# Patient Record
Sex: Female | Born: 1940
Health system: Southern US, Community
[De-identification: ages and names within clinical notes are randomized; demographics above are authoritative.]

## PROBLEM LIST (undated history)

## (undated) DIAGNOSIS — G40909 Epilepsy, unspecified, not intractable, without status epilepticus: Secondary | ICD-10-CM

## (undated) DIAGNOSIS — K219 Gastro-esophageal reflux disease without esophagitis: Secondary | ICD-10-CM

## (undated) DIAGNOSIS — S92309A Fracture of unspecified metatarsal bone(s), unspecified foot, initial encounter for closed fracture: Secondary | ICD-10-CM

## (undated) DIAGNOSIS — R569 Unspecified convulsions: Secondary | ICD-10-CM

## (undated) DIAGNOSIS — D039 Melanoma in situ, unspecified: Secondary | ICD-10-CM

## (undated) DIAGNOSIS — I10 Essential (primary) hypertension: Secondary | ICD-10-CM

## (undated) HISTORY — DX: Gastro-esophageal reflux disease without esophagitis: K21.9

## (undated) HISTORY — DX: Fracture of unspecified metatarsal bone(s), unspecified foot, initial encounter for closed fracture: S92.309A

## (undated) HISTORY — DX: Epilepsy, unspecified, not intractable, without status epilepticus: G40.909

## (undated) HISTORY — DX: Essential (primary) hypertension: I10

## (undated) HISTORY — PX: EYE SURGERY: SHX253

## (undated) HISTORY — DX: Melanoma in situ, unspecified: D03.9

## (undated) HISTORY — DX: Unspecified convulsions: R56.9

---
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
## 1997-12-21 ENCOUNTER — Other Ambulatory Visit: Admission: RE | Admit: 1997-12-21 | Discharge: 1997-12-21 | Payer: Self-pay | Admitting: Obstetrics and Gynecology

## 1998-11-26 ENCOUNTER — Other Ambulatory Visit: Admission: RE | Admit: 1998-11-26 | Discharge: 1998-11-26 | Payer: Self-pay | Admitting: Obstetrics and Gynecology

## 2000-12-07 ENCOUNTER — Other Ambulatory Visit: Admission: RE | Admit: 2000-12-07 | Discharge: 2000-12-07 | Payer: Self-pay | Admitting: Obstetrics and Gynecology

## 2001-12-08 ENCOUNTER — Other Ambulatory Visit: Admission: RE | Admit: 2001-12-08 | Discharge: 2001-12-08 | Payer: Self-pay | Admitting: Obstetrics and Gynecology

## 2002-12-19 ENCOUNTER — Other Ambulatory Visit: Admission: RE | Admit: 2002-12-19 | Discharge: 2002-12-19 | Payer: Self-pay | Admitting: Obstetrics and Gynecology

## 2003-12-26 ENCOUNTER — Other Ambulatory Visit: Admission: RE | Admit: 2003-12-26 | Discharge: 2003-12-26 | Payer: Self-pay | Admitting: Obstetrics and Gynecology

## 2005-01-22 ENCOUNTER — Other Ambulatory Visit: Admission: RE | Admit: 2005-01-22 | Discharge: 2005-01-22 | Payer: Self-pay | Admitting: Obstetrics and Gynecology

## 2006-01-23 ENCOUNTER — Other Ambulatory Visit: Admission: RE | Admit: 2006-01-23 | Discharge: 2006-01-23 | Payer: Self-pay | Admitting: Obstetrics and Gynecology

## 2006-05-12 HISTORY — PX: FOOT SURGERY: SHX648

## 2006-10-12 ENCOUNTER — Ambulatory Visit: Payer: Self-pay | Admitting: Internal Medicine

## 2006-10-12 DIAGNOSIS — G40909 Epilepsy, unspecified, not intractable, without status epilepticus: Secondary | ICD-10-CM

## 2006-10-12 DIAGNOSIS — M810 Age-related osteoporosis without current pathological fracture: Secondary | ICD-10-CM | POA: Insufficient documentation

## 2006-10-12 DIAGNOSIS — K219 Gastro-esophageal reflux disease without esophagitis: Secondary | ICD-10-CM | POA: Insufficient documentation

## 2006-10-12 DIAGNOSIS — I10 Essential (primary) hypertension: Secondary | ICD-10-CM

## 2006-10-13 LAB — CONVERTED CEMR LAB
ALT: 25 units/L (ref 0–40)
Bilirubin, Direct: 0.1 mg/dL (ref 0.0–0.3)
Chloride: 99 meq/L (ref 96–112)
Eosinophils Absolute: 0.3 10*3/uL (ref 0.0–0.6)
Eosinophils Relative: 7.1 % — ABNORMAL HIGH (ref 0.0–5.0)
GFR calc Af Amer: 129 mL/min
GFR calc non Af Amer: 107 mL/min
HCT: 39.1 % (ref 36.0–46.0)
Lymphocytes Relative: 28.1 % (ref 12.0–46.0)
MCV: 94.7 fL (ref 78.0–100.0)
Neutro Abs: 2.2 10*3/uL (ref 1.4–7.7)
Neutrophils Relative %: 52.6 % (ref 43.0–77.0)
Potassium: 4.6 meq/L (ref 3.5–5.1)
RBC: 4.12 M/uL (ref 3.87–5.11)
Sodium: 134 meq/L — ABNORMAL LOW (ref 135–145)
Total Protein: 6.8 g/dL (ref 6.0–8.3)
WBC: 4.3 10*3/uL — ABNORMAL LOW (ref 4.5–10.5)

## 2006-11-03 ENCOUNTER — Encounter: Payer: Self-pay | Admitting: Internal Medicine

## 2007-02-11 ENCOUNTER — Ambulatory Visit: Payer: Self-pay | Admitting: Internal Medicine

## 2007-03-15 ENCOUNTER — Ambulatory Visit: Payer: Self-pay | Admitting: Gastroenterology

## 2007-03-29 ENCOUNTER — Encounter: Payer: Self-pay | Admitting: Internal Medicine

## 2007-03-29 ENCOUNTER — Encounter: Payer: Self-pay | Admitting: Gastroenterology

## 2007-03-29 ENCOUNTER — Ambulatory Visit: Payer: Self-pay | Admitting: Gastroenterology

## 2007-05-19 ENCOUNTER — Encounter: Payer: Self-pay | Admitting: Internal Medicine

## 2007-06-18 ENCOUNTER — Ambulatory Visit: Payer: Self-pay | Admitting: Internal Medicine

## 2007-10-11 ENCOUNTER — Telehealth (INDEPENDENT_AMBULATORY_CARE_PROVIDER_SITE_OTHER): Payer: Self-pay | Admitting: *Deleted

## 2007-12-29 ENCOUNTER — Ambulatory Visit: Payer: Self-pay | Admitting: Internal Medicine

## 2007-12-31 LAB — CONVERTED CEMR LAB
Alkaline Phosphatase: 97 units/L (ref 39–117)
BUN: 7 mg/dL (ref 6–23)
Basophils Absolute: 0.1 10*3/uL (ref 0.0–0.1)
Bilirubin, Direct: 0.1 mg/dL (ref 0.0–0.3)
CO2: 28 meq/L (ref 19–32)
Chloride: 96 meq/L (ref 96–112)
Creatinine, Ser: 0.7 mg/dL (ref 0.4–1.2)
Eosinophils Absolute: 0.3 10*3/uL (ref 0.0–0.7)
MCHC: 34.9 g/dL (ref 30.0–36.0)
MCV: 96.1 fL (ref 78.0–100.0)
Neutrophils Relative %: 65 % (ref 43.0–77.0)
Phenobarbital: 9.7 ug/mL — ABNORMAL LOW (ref 15.0–40.0)
Platelets: 307 10*3/uL (ref 150–400)
Potassium: 5.7 meq/L — ABNORMAL HIGH (ref 3.5–5.1)
RDW: 12.6 % (ref 11.5–14.6)
Total Bilirubin: 0.8 mg/dL (ref 0.3–1.2)
WBC: 5.5 10*3/uL (ref 4.5–10.5)

## 2008-01-10 ENCOUNTER — Ambulatory Visit: Payer: Self-pay | Admitting: Internal Medicine

## 2008-01-10 ENCOUNTER — Telehealth: Payer: Self-pay | Admitting: Internal Medicine

## 2008-01-10 DIAGNOSIS — E876 Hypokalemia: Secondary | ICD-10-CM | POA: Insufficient documentation

## 2008-01-11 ENCOUNTER — Encounter: Payer: Self-pay | Admitting: Internal Medicine

## 2008-01-11 LAB — CONVERTED CEMR LAB
CO2: 31 meq/L (ref 19–32)
Calcium: 9.5 mg/dL (ref 8.4–10.5)
Cortisol, Plasma: 5.5 ug/dL
GFR calc Af Amer: 108 mL/min
GFR calc non Af Amer: 89 mL/min
Glucose, Bld: 89 mg/dL (ref 70–99)
Potassium: 4.6 meq/L (ref 3.5–5.1)
Sodium: 132 meq/L — ABNORMAL LOW (ref 135–145)

## 2008-02-14 ENCOUNTER — Other Ambulatory Visit: Admission: RE | Admit: 2008-02-14 | Discharge: 2008-02-14 | Payer: Self-pay | Admitting: Obstetrics and Gynecology

## 2008-02-14 ENCOUNTER — Ambulatory Visit: Payer: Self-pay | Admitting: Obstetrics and Gynecology

## 2008-02-14 ENCOUNTER — Encounter: Payer: Self-pay | Admitting: Obstetrics and Gynecology

## 2008-03-23 ENCOUNTER — Encounter: Payer: Self-pay | Admitting: Internal Medicine

## 2008-04-19 ENCOUNTER — Encounter: Payer: Self-pay | Admitting: Internal Medicine

## 2008-06-23 ENCOUNTER — Ambulatory Visit: Payer: Self-pay | Admitting: Internal Medicine

## 2008-06-26 LAB — CONVERTED CEMR LAB
ALT: 15 units/L (ref 0–35)
AST: 25 units/L (ref 0–37)
Albumin: 4.5 g/dL (ref 3.5–5.2)
Basophils Absolute: 0 10*3/uL (ref 0.0–0.1)
Basophils Relative: 1 % (ref 0–1)
CO2: 22 meq/L (ref 19–32)
Calcium: 9.8 mg/dL (ref 8.4–10.5)
Chloride: 96 meq/L (ref 96–112)
Creatinine, Ser: 0.65 mg/dL (ref 0.40–1.20)
Hemoglobin: 13.2 g/dL (ref 12.0–15.0)
Lymphocytes Relative: 25 % (ref 12–46)
MCHC: 35 g/dL (ref 30.0–36.0)
Monocytes Absolute: 0.7 10*3/uL (ref 0.1–1.0)
Neutro Abs: 3.3 10*3/uL (ref 1.7–7.7)
Neutrophils Relative %: 56 % (ref 43–77)
Potassium: 4.5 meq/L (ref 3.5–5.3)
RBC: 4.25 M/uL (ref 3.87–5.11)
RDW: 12.3 % (ref 11.5–15.5)
Total Protein: 7 g/dL (ref 6.0–8.3)

## 2008-06-29 ENCOUNTER — Telehealth: Payer: Self-pay | Admitting: Internal Medicine

## 2008-07-27 ENCOUNTER — Ambulatory Visit: Payer: Self-pay | Admitting: Internal Medicine

## 2008-08-01 LAB — CONVERTED CEMR LAB: Creatinine, Ser: 0.7 mg/dL (ref 0.4–1.2)

## 2008-09-04 ENCOUNTER — Telehealth: Payer: Self-pay | Admitting: Internal Medicine

## 2008-10-02 ENCOUNTER — Ambulatory Visit: Payer: Self-pay | Admitting: Internal Medicine

## 2008-10-02 LAB — CONVERTED CEMR LAB
BUN: 11 mg/dL (ref 6–23)
CO2: 31 meq/L (ref 19–32)
Chloride: 95 meq/L — ABNORMAL LOW (ref 96–112)
Creatinine, Ser: 0.6 mg/dL (ref 0.4–1.2)
Glucose, Bld: 72 mg/dL (ref 70–99)
Potassium: 4.4 meq/L (ref 3.5–5.1)

## 2008-12-25 ENCOUNTER — Ambulatory Visit: Payer: Self-pay | Admitting: Internal Medicine

## 2009-03-07 ENCOUNTER — Ambulatory Visit: Payer: Self-pay | Admitting: Obstetrics and Gynecology

## 2009-03-12 ENCOUNTER — Encounter: Payer: Self-pay | Admitting: Internal Medicine

## 2009-03-20 ENCOUNTER — Encounter: Payer: Self-pay | Admitting: Internal Medicine

## 2009-06-28 ENCOUNTER — Ambulatory Visit: Payer: Self-pay | Admitting: Internal Medicine

## 2009-06-30 LAB — CONVERTED CEMR LAB: Phenobarbital: 10.2 ug/mL — ABNORMAL LOW (ref 15.0–40.0)

## 2009-07-02 LAB — CONVERTED CEMR LAB
ALT: 24 units/L (ref 0–35)
Alkaline Phosphatase: 73 units/L (ref 39–117)
Bilirubin, Direct: 0.2 mg/dL (ref 0.0–0.3)
Calcium: 10 mg/dL (ref 8.4–10.5)
Chloride: 96 meq/L (ref 96–112)
Glucose, Bld: 85 mg/dL (ref 70–99)
HCT: 41.6 % (ref 36.0–46.0)
Hemoglobin: 13.8 g/dL (ref 12.0–15.0)
MCV: 94.4 fL (ref 78.0–100.0)
Monocytes Relative: 5.8 % (ref 3.0–12.0)
Phosphorus: 4 mg/dL (ref 2.3–4.6)
Platelets: 263 10*3/uL (ref 150.0–400.0)
Potassium: 4.8 meq/L (ref 3.5–5.1)
RDW: 13 % (ref 11.5–14.6)
Sodium: 131 meq/L — ABNORMAL LOW (ref 135–145)
Total Bilirubin: 0.6 mg/dL (ref 0.3–1.2)

## 2009-12-31 ENCOUNTER — Ambulatory Visit: Payer: Self-pay | Admitting: Internal Medicine

## 2010-02-06 ENCOUNTER — Encounter: Payer: Self-pay | Admitting: Gastroenterology

## 2010-03-05 ENCOUNTER — Encounter (INDEPENDENT_AMBULATORY_CARE_PROVIDER_SITE_OTHER): Payer: Self-pay | Admitting: *Deleted

## 2010-03-11 ENCOUNTER — Other Ambulatory Visit: Admission: RE | Admit: 2010-03-11 | Discharge: 2010-03-11 | Payer: Self-pay | Admitting: Obstetrics and Gynecology

## 2010-03-11 ENCOUNTER — Ambulatory Visit: Payer: Self-pay | Admitting: Obstetrics and Gynecology

## 2010-03-13 ENCOUNTER — Encounter: Payer: Self-pay | Admitting: Internal Medicine

## 2010-03-14 ENCOUNTER — Encounter: Payer: Self-pay | Admitting: Internal Medicine

## 2010-03-15 ENCOUNTER — Telehealth: Payer: Self-pay | Admitting: Internal Medicine

## 2010-03-18 ENCOUNTER — Encounter: Payer: Self-pay | Admitting: Internal Medicine

## 2010-03-25 ENCOUNTER — Encounter: Payer: Self-pay | Admitting: Internal Medicine

## 2010-04-09 ENCOUNTER — Encounter: Payer: Self-pay | Admitting: Internal Medicine

## 2010-04-23 ENCOUNTER — Encounter: Payer: Self-pay | Admitting: Internal Medicine

## 2010-06-12 ENCOUNTER — Encounter: Payer: Self-pay | Admitting: Gastroenterology

## 2010-06-13 NOTE — Medication Information (Signed)
Summary: Clarification for [facility]  Clarification for [facility]   Imported By: Lanelle Bal 04/26/2010 09:31:00  _____________________________________________________________________  External Attachment:    Type:   Image     Comment:   External Document

## 2010-06-13 NOTE — Miscellaneous (Signed)
Summary: FLU VACCINE   Clinical Lists Changes  Observations: Added new observation of FLU VAX: Historical (04/03/2010 15:36)      Influenza Immunization History:    Influenza # 1:  Historical (04/03/2010)

## 2010-06-13 NOTE — Assessment & Plan Note (Signed)
Summary: 6 MONTH FOLOW UP/[facility]   Vital Signs:  Patient profile:   70 year old female Weight:      120 pounds BMI:     21.85 Temp:     97.8 degrees F oral Pulse rate:   60 / minute Pulse rhythm:   regular BP sitting:   160 / 80  (left arm) Cuff size:   regular  Vitals Entered By: Mervin Hack CMA Duncan Dull) (December 31, 2009 2:26 PM) CC: 6 month follow-up   History of Present Illness: DOing well No new concerns  Has appt for DEXA in November and will see Dr Valarie Cones in December. Has sig dietary calcium still on vitamin D  Has been monitoring blood pressure avg 141/71 No headaches  No chest pain  No SOB  No seizures Had seizure in 1997 Tried going off mebaral a couple of years later and had a break through seizure She is comfortable with staying on this No sedation or other problems with mebaral  No stomach trouble no heartburn No meds for this  Allergies: 1)  ! Dilantin  Past History:  Past medical, surgical, family and social histories (including risk factors) reviewed for relevance to current acute and chronic problems.  Past Medical History: Reviewed history from 10/12/2006 and no changes required. GERD Hypertension Osteoporosis Seizure disorder Migraines--none in years Vitamin D deficiency  CONSULTANTS Dr Dianne Dun  Past Surgical History: Reviewed history from 10/12/2006 and no changes required. Tonsillectomy and adenoids--1948 Repair of fractured R metatarsal --Dr Duda---4/08  Family History: Reviewed history from 12/25/2008 and no changes required. Dad died @35 --MI? Mom died @88 ---head injury after fall Only child CAD in paternal aunts and uncles CVA in grandparents on both sides DM in maternal aunt HTN in mom Daughter  diagnosed with breast cancer  Social History: Reviewed history from 10/12/2006 and no changes required. Retired--teacher's asst Married--3 children Never Smoked Alcohol use-yes--occ Regular exercise-yes---aerobics,  Pilates, jogged in past (now walks)  Review of Systems       Appetite is good weight is up 3#--feels about 5# over her ideal weight exercises daily Sleeps okay  Physical Exam  General:  alert and normal appearance.   Neck:  supple, no masses, no thyromegaly, no carotid bruits, and no cervical lymphadenopathy.   Lungs:  normal respiratory effort, no intercostal retractions, no accessory muscle use, and normal breath sounds.   Heart:  normal rate, regular rhythm, no murmur, and no gallop.   Abdomen:  soft and non-tender.   Msk:  no joint tenderness and no joint swelling.   Extremities:  no edema Psych:  normally interactive, good eye contact, not anxious appearing, and not depressed appearing.     Impression & Recommendations:  Problem # 1:  HYPERTENSION (ICD-401.9) Assessment Unchanged up some today still fine at home no changes  Her updated medication list for this problem includes:    Norvasc 5 Mg Tabs (Amlodipine besylate) .Marland Kitchen... Take 1 tablet by mouth once a day    Toprol Xl 100 Mg Tb24 (Metoprolol succinate) .Marland Kitchen... 1 daily    Cozaar 100 Mg Tabs (Losartan potassium) .Marland Kitchen... Take 1 tablet by mouth once daily  BP today: 160/80 Prior BP: 142/80 (06/28/2009)  Labs Reviewed: K+: 4.8 (06/28/2009) Creat: : 0.6 (06/28/2009)     Problem # 2:  SEIZURE DISORDER (ICD-780.39) Assessment: Unchanged none on mebaral will continue--she is not comfortable with trying wean  Her updated medication list for this problem includes:    Mebaral 100 Mg Tabs (Mephobarbital) .Marland Kitchen... 1 tab  by mouth two times a day for seizure disorder  Problem # 3:  OSTEOPOROSIS (ICD-733.00) Assessment: Unchanged has DEXA and endocrine follow up Dr Valarie Cones to make decision about the fosamax  Her updated medication list for this problem includes:    Fosamax 35 Mg Tabs (Alendronate sodium) .Marland Kitchen... 1 weekly    Vitamin D (ergocalciferol) 50000 Unit Caps (Ergocalciferol) .Marland Kitchen... Take 1 by mouth two times a  month  Problem # 4:  GERD (ICD-530.81) Assessment: Unchanged fine without meds  Complete Medication List: 1)  Norvasc 5 Mg Tabs (Amlodipine besylate) .... Take 1 tablet by mouth once a day 2)  Estring Ring (Estradiol ring) .... Every 3 months 3)  Fosamax 35 Mg Tabs (Alendronate sodium) .Marland KitchenMarland KitchenMarland Kitchen 1 weekly 4)  Toprol Xl 100 Mg Tb24 (Metoprolol succinate) .Marland Kitchen.. 1 daily 5)  Mebaral 100 Mg Tabs (Mephobarbital) .Marland Kitchen.. 1 tab by mouth two times a day for seizure disorder 6)  Cozaar 100 Mg Tabs (Losartan potassium) .... Take 1 tablet by mouth once daily 7)  Vitamin D (ergocalciferol) 50000 Unit Caps (Ergocalciferol) .... Take 1 by mouth two times a month  Patient Instructions: 1)  Please schedule a follow-up appointment in 6 months for annual wellness visit Prescriptions: MEBARAL 100 MG TABS (MEPHOBARBITAL) 1 tab by mouth two times a day for seizure disorder  #180 x 1   Entered by:   Mervin Hack CMA (AAMA)   Authorized by:   Cindee Salt MD   Signed by:   Mervin Hack CMA (AAMA) on 12/31/2009   Method used:   Print then Give to Patient   RxID:   [id number] TOPROL XL 100 MG  TB24 (METOPROLOL SUCCINATE) 1 daily  #102 x 3   Entered by:   Mervin Hack CMA (AAMA)   Authorized by:   Cindee Salt MD   Signed by:   Mervin Hack CMA (AAMA) on 12/31/2009   Method used:   Faxed to ...       [facility] (mail-order)             , [place]         Ph: [phone]       Fax: [phone]   RxID:   [id number]   Current Allergies (reviewed today): ! DILANTIN

## 2010-06-13 NOTE — Assessment & Plan Note (Signed)
Summary: 6 MONTH FOLLOW UP   Vital Signs:  Patient profile:   70 year old female Weight:      117 pounds BMI:     21.30 Temp:     98.2 degrees F oral Pulse rate:   64 / minute Pulse rhythm:   regular BP sitting:   142 / 80  (left arm) Cuff size:   regular  Vitals Entered By: Mervin Hack CMA Duncan Dull) (June 28, 2009 3:50 PM) CC: 6 month follow-up   History of Present Illness: Doing well No new concerns  No seizures  Monitors BP about monthly Range 112/77  to  155/85 AVg 138/80  Still on fosamax Started it about 8 years ago Has follow up with Dr Valarie Cones later this year On vitamin D  No problems with stomach No acid meds  keeps up with gyn still on estring  Allergies: 1)  ! Dilantin  Past History:  Past medical, surgical, family and social histories (including risk factors) reviewed for relevance to current acute and chronic problems.  Past Medical History: Reviewed history from 10/12/2006 and no changes required. GERD Hypertension Osteoporosis Seizure disorder Migraines--none in years Vitamin D deficiency  CONSULTANTS Dr Dianne Dun  Past Surgical History: Reviewed history from 10/12/2006 and no changes required. Tonsillectomy and adenoids--1948 Repair of fractured R metatarsal --Dr Duda---4/08  Family History: Reviewed history from 12/25/2008 and no changes required. Dad died @35 --MI? Mom died @88 ---head injury after fall Only child CAD in paternal aunts and uncles CVA in grandparents on both sides DM in maternal aunt HTN in mom Daughter  diagnosed with breast cancer  Social History: Reviewed history from 10/12/2006 and no changes required. 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     Neck:  supple, no masses, no thyromegaly, no carotid bruits, and no cervical lymphadenopathy.   Lungs:  normal respiratory effort and normal breath sounds.   Heart:  normal rate, regular rhythm, no murmur, and no gallop.   Abdomen:  soft and non-tender.   Msk:  no joint tenderness and no joint swelling.   Pulses:  1+ in feet Extremities:  no edema Psych:  normally interactive, good eye contact, not anxious appearing, and not depressed appearing.     Impression & Recommendations:  Problem # 1:  HYPERTENSION (ICD-401.9) Assessment Unchanged  good control no changes needed  Her updated medication list for this problem includes:    Norvasc 5 Mg Tabs (Amlodipine besylate) .Marland Kitchen... Take 1 tablet by mouth once a day    Toprol Xl 100 Mg Tb24 (Metoprolol succinate) .Marland Kitchen... 1 daily    Cozaar 100 Mg Tabs (Losartan potassium) .Marland Kitchen... Take 1 tablet by mouth once daily  BP today: 142/80 Prior BP: 130/70 (12/25/2008)  Labs Reviewed: K+: 4.4 (10/02/2008) Creat: : 0.6 (10/02/2008)     Orders: Venipuncture (40981) TLB-CBC Platelet - w/Differential (85025-CBCD) TLB-Renal Function Panel (80069-RENAL) TLB-Hepatic/Liver Function Pnl (80076-HEPATIC) TLB-TSH (Thyroid Stimulating Hormone) (19147-WGN) Prescription Created Electronically [phone])  Problem # 2:  OSTEOPOROSIS (ICD-733.00) Assessment: Unchanged probably should go off fosamax will review with endocrinologist  Her updated medication list for this problem includes:    Fosamax 35 Mg Tabs (Alendronate sodium) .Marland Kitchen... 1 weekly    Vitamin D (ergocalciferol) 50000 Unit Caps (Ergocalciferol) .Marland Kitchen... Take 1 by mouth two times a  month  Problem # 3:  SEIZURE DISORDER (ICD-780.39) Assessment: Unchanged  none on meds will check phenobarb level  Her updated medication list for this problem includes:    Mebaral 100 Mg Tabs (Mephobarbital) .Marland Kitchen... 1 tab by mouth two times a day for seizure disorder  Orders: Specimen Handling (04540) T- * Misc.  Laboratory test [phone])  Complete Medication List: 1)  Norvasc 5 Mg Tabs (Amlodipine besylate) .... Take 1 tablet by mouth once a day 2)  Estring Ring (Estradiol ring) .... Every 3 months 3)  Fosamax 35 Mg Tabs (Alendronate sodium) .Marland KitchenMarland KitchenMarland Kitchen 1 weekly 4)  Toprol Xl 100 Mg Tb24 (Metoprolol succinate) .Marland Kitchen.. 1 daily 5)  Mebaral 100 Mg Tabs (Mephobarbital) .Marland Kitchen.. 1 tab by mouth two times a day for seizure disorder 6)  Cozaar 100 Mg Tabs (Losartan potassium) .... Take 1 tablet by mouth once daily 7)  Vitamin D (ergocalciferol) 50000 Unit Caps (Ergocalciferol) .... Take 1 by mouth two times a month  Patient Instructions: 1)  Please schedule a follow-up appointment in 6 months .  Prescriptions: MEBARAL 100 MG TABS (MEPHOBARBITAL) 1 tab by mouth two times a day for seizure disorder  #180 x 1   Entered and Authorized by:   Cindee Salt MD   Signed by:   Cindee Salt MD on 06/28/2009   Method used:   Print then Give to Patient   RxID:   [id number] COZAAR 100 MG TABS (LOSARTAN POTASSIUM) take 1 tablet by mouth once daily  #90 x 3   Entered by:   Mervin Hack CMA (AAMA)   Authorized by:   Cindee Salt MD   Signed by:   Mervin Hack CMA (AAMA) on 06/28/2009   Method used:   Electronically to        [facility] (mail-order)             ,          Ph: [phone]       Fax: [phone]   RxID:   [id number] NORVASC 5 MG TABS (AMLODIPINE BESYLATE) Take 1 tablet by mouth once a day  #90 x 3   Entered by:   Mervin Hack CMA (AAMA)   Authorized by:   Cindee Salt MD   Signed by:   Mervin Hack CMA (AAMA) on 06/28/2009   Method used:   Electronically to        [facility] (mail-order)             ,          Ph: [phone]       Fax: [phone]   RxID:   [id number]   Current Allergies (reviewed today): ! DILANTIN  Appended Document: 6 MONTH FOLLOW UP

## 2010-06-13 NOTE — Letter (Signed)
Summary: Pre Visit Letter Revised  [facility]  [address] [place], [place] [address]   Phone: [phone]  Fax: [phone]        03/05/2010 MRN: [id number] Tanya Knight [address] [place], [place]  [address]             Procedure Date:  04/17/2010   Welcome to the Gastroenterology Division at [facility].    You are scheduled to see a nurse for your pre-procedure visit on 04/08/2010 at 1:00PM on the 3rd floor at [facility], [address].  We ask that you try to arrive at our office 15 minutes prior to your appointment time to allow for check-in.  Please take a minute to review the attached form.  If you answer "Yes" to one or more of the questions on the first page, we ask that you call the person listed at your earliest opportunity.  If you answer "No" to all of the questions, please complete the rest of the form and bring it to your appointment.    Your nurse visit will consist of discussing your medical and surgical history, your immediate family medical history, and your medications.   If you are unable to list all of your medications on the form, please bring the medication bottles to your appointment and we will list them.  We will need to be aware of both prescribed and over the counter drugs.  We will need to know exact dosage information as well.    Please be prepared to read and sign documents such as consent forms, a financial agreement, and acknowledgement forms.  If necessary, and with your consent, a friend or relative is welcome to sit-in on the nurse visit with you.  Please bring your insurance card so that we may make a copy of it.  If your insurance requires a referral to see a specialist, please bring your referral form from your primary care physician.  No co-pay is required for this nurse visit.     If you cannot keep your appointment, please call [phone] to cancel or reschedule prior to your appointment date.  This  allows [place] the opportunity to schedule an appointment for another patient in need of care.    Thank you for choosing [facility] for your medical needs.  We appreciate the opportunity to care for you.  Please visit [place] at our website  to learn more about our practice.  Sincerely, The Gastroenterology Division

## 2010-06-13 NOTE — Letter (Signed)
Summary: Colonoscopy Letter  [facility]  [address] [place], [place] [address]   Phone: [phone]  Fax: [phone]      February 06, 2010 MRN: [id number]   Tanya Knight [address] [place], [place]  [address]   Dear [name],   According to your medical record, it is time for you to schedule a Colonoscopy. The American Cancer Society recommends this procedure as a method to detect early colon cancer. Patients with a family history of colon cancer, or a personal history of colon polyps or inflammatory bowel disease are at increased risk.  This letter has been generated based on the recommendations made at the time of your procedure. If you feel that in your particular situation this may no longer apply, please contact our office.  Please call our office at [phone] to schedule this appointment or to update your records at your earliest convenience.  Thank you for cooperating with [place] to provide you with the very best care possible.   Sincerely,  Rachael Fee, M.D.  [facility] Gastroenterology Division [phone]

## 2010-06-13 NOTE — Letter (Signed)
Summary: [facility]  [facility]   Imported By: Lanelle Bal 05/14/2010 10:40:28  _____________________________________________________________________  External Attachment:    Type:   Image     Comment:   External Document  Appended Document: [facility] will start alendronate drug holiday after 10 years of Rx

## 2010-06-13 NOTE — Letter (Signed)
Summary: Results Follow up Letter  [place] [facility]  [address] [place], [place] [address]   Phone: [phone]  Fax: [phone]    03/14/2010 MRN: [id number]  Tanya Knight [address] [place], [place]  [address]  Dear Ms. Duby,  The following are the results of your recent test(s):  Test         Result    Pap Smear:        Normal _____  Not Normal _____ Comments: ______________________________________________________ Cholesterol: LDL(Bad cholesterol):         Your goal is less than:         HDL (Good cholesterol):       Your goal is more than: Comments:  ______________________________________________________ Mammogram:        Normal __x___  Not Normal _____ Comments:mammo looks fine repeat recommended in 1-2 years  ___________________________________________________________________ Hemoccult:        Normal _____  Not normal _______ Comments:    _____________________________________________________________________ Other Tests:    We routinely do not discuss normal results over the telephone.  If you desire a copy of the results, or you have any questions about this information we can discuss them at your next office visit.   Sincerely,      Tillman Abide, MD

## 2010-06-13 NOTE — Progress Notes (Signed)
Summary: Alternative for Mebaral-[facility]  Phone Note From Pharmacy Call back at [phone]   Caller: [facility] Call For: Dr. Alphonsus Sias  Summary of Call: Received faxed form from [facility] stating that Mebaral has been discontinued from the manufacture and patient needs a alternative.  Form in your IN box.   Initial call taken by: Linde Gillis CMA Duncan Dull),  March 15, 2010 8:43 AM  Follow-up for Phone Call        message left on her machine May want to change to phenobarbital or other anticonvulsant Cindee Salt MD  March 15, 2010 2:10 PM   Pt returned your call after you had left.  Please call her back on monday, cell is [phone] if she is not at home. Follow-up by: Lowella Petties CMA, AAMA,  March 15, 2010 2:20 PM  Additional Follow-up for Phone Call Additional follow up Details #1::        has about 5 months built up will continue the mebaral and we will discuss our alternatives at her next appt in February Additional Follow-up by: Cindee Salt MD,  March 18, 2010 2:07 PM

## 2010-06-19 NOTE — Letter (Signed)
Summary: Pre Visit Letter Revised  [facility]  [address] [place], [place] [address]   Phone: [phone]  Fax: [phone]        06/12/2010 MRN: [id number]  Tanya Knight [address] [place], [place]  [address]             Procedure Date:  07-23-10 10:30am           Dr Christella Hartigan Recall Colon   Welcome to the Gastroenterology Division at [facility].    You are scheduled to see a nurse for your pre-procedure visit on 07-09-10 at 1pm on the 3rd floor at [facility], [address].  We ask that you try to arrive at our office 15 minutes prior to your appointment time to allow for check-in.  Please take a minute to review the attached form.  If you answer "Yes" to one or more of the questions on the first page, we ask that you call the person listed at your earliest opportunity.  If you answer "No" to all of the questions, please complete the rest of the form and bring it to your appointment.    Your nurse visit will consist of discussing your medical and surgical history, your immediate family medical history, and your medications.   If you are unable to list all of your medications on the form, please bring the medication bottles to your appointment and we will list them.  We will need to be aware of both prescribed and over the counter drugs.  We will need to know exact dosage information as well.    Please be prepared to read and sign documents such as consent forms, a financial agreement, and acknowledgement forms.  If necessary, and with your consent, a friend or relative is welcome to sit-in on the nurse visit with you.  Please bring your insurance card so that we may make a copy of it.  If your insurance requires a referral to see a specialist, please bring your referral form from your primary care physician.  No co-pay is required for this nurse visit.     If you cannot keep your appointment, please call [phone] to cancel or reschedule prior  to your appointment date.  This allows [place] the opportunity to schedule an appointment for another patient in need of care.    Thank you for choosing [facility] for your medical needs.  We appreciate the opportunity to care for you.  Please visit [place] at our website  to learn more about our practice.  Sincerely, The Gastroenterology Division

## 2010-07-02 ENCOUNTER — Encounter: Payer: Self-pay | Admitting: Internal Medicine

## 2010-07-02 ENCOUNTER — Encounter (INDEPENDENT_AMBULATORY_CARE_PROVIDER_SITE_OTHER): Payer: Medicare Other | Admitting: Internal Medicine

## 2010-07-02 DIAGNOSIS — Z0000 Encounter for general adult medical examination without abnormal findings: Secondary | ICD-10-CM

## 2010-07-02 DIAGNOSIS — R569 Unspecified convulsions: Secondary | ICD-10-CM

## 2010-07-05 ENCOUNTER — Encounter (INDEPENDENT_AMBULATORY_CARE_PROVIDER_SITE_OTHER): Payer: Self-pay

## 2010-07-09 ENCOUNTER — Encounter: Payer: Self-pay | Admitting: Gastroenterology

## 2010-07-09 ENCOUNTER — Telehealth: Payer: Self-pay | Admitting: Gastroenterology

## 2010-07-09 NOTE — Assessment & Plan Note (Signed)
Summary: CPE/[facility] wellness/JRR   Vital Signs:  Patient profile:   70 year old female Height:      61.5 inches Weight:      118 pounds Temp:     98.5 degrees F oral Pulse rate:   54 / minute Pulse rhythm:   regular BP sitting:   165 / 85  (left arm) Cuff size:   regular  Vitals Entered By: Mervin Hack CMA Duncan Dull) (July 02, 2010 9:32 AM) CC: adult physical   History of Present Illness: DOing well Still sees Dr Dianne Dun for gyn  No seizures  Almost out of mebaral which has been discontinued First seizure 1997 She tried weaning in 1999---has breakthrough so has been back on meds since then  COntinues to work out regularly Reviewed her wellness visit questionnaire no other concerns   Checks her BP at home average of 145/78  Allergies: 1)  ! Dilantin  Past History:  Past medical, surgical, family and social histories (including risk factors) reviewed for relevance to current acute and chronic problems.  Past Medical History: Reviewed history from 10/12/2006 and no changes required. GERD Hypertension Osteoporosis Seizure disorder Migraines--none in years Vitamin D deficiency  CONSULTANTS Dr Dianne Dun  Past Surgical History: Reviewed history from 10/12/2006 and no changes required. Tonsillectomy and adenoids--1948 Repair of fractured R metatarsal --Dr Duda---4/08  Family History: Reviewed history from 12/25/2008 and no changes required. Dad died @35 --MI? Mom died @88 ---head injury after fall Only child CAD in paternal aunts and uncles CVA in grandparents on both sides DM in maternal aunt HTN in mom Daughter  diagnosed with breast cancer  Social History: Reviewed history from 10/12/2006 and no changes required. Retired--teacher's asst Married--3 children Never Smoked Alcohol use-yes--occ Regular exercise-yes---aerobics, Pilates, jogged in past (now walks)  Review of Systems General:  weight fairly stable sleeps well wears seat  belt. Eyes:  Denies double vision and vision loss-1 eye. ENT:  Denies decreased hearing and ringing in ears; teeth fine---regular with dentist. CV:  Denies chest pain or discomfort, difficulty breathing at night, difficulty breathing while lying down, fainting, lightheadness, palpitations, and shortness of breath with exertion. Resp:  Denies cough and shortness of breath. GI:  Denies abdominal pain, bloody stools, change in bowel habits, dark tarry stools, indigestion, nausea, and vomiting; has appt for colonoscopy. GU:  Denies dysuria, incontinence, and urinary frequency; no sexual problems--uses estring and K-Y for lubricaiton. MS:  Denies joint pain and joint swelling. Derm:  Denies lesion(s) and rash; sees derm regularly due to past suspicious lesions. Neuro:  Denies headaches, numbness, seizures, tingling, and weakness. Psych:  Denies anxiety and depression. Heme:  Denies abnormal bruising and enlarge lymph nodes. Allergy:  Denies seasonal allergies and sneezing.  Physical Exam  General:  alert and normal appearance.   Eyes:  pupils equal, pupils round, and pupils reactive to light.   Ears:  R ear normal and L ear normal.   Mouth:  no erythema, no exudates, and no lesions.   Neck:  supple, no masses, no thyromegaly, no carotid bruits, and no cervical lymphadenopathy.   Lungs:  normal respiratory effort, no intercostal retractions, no accessory muscle use, and normal breath sounds.   Heart:  normal rate, regular rhythm, no murmur, and no gallop.   Abdomen:  soft, non-tender, and no masses.   Msk:  no joint tenderness and no joint swelling.   Slight thickening in DIP and PIPs in hands Pulses:  normal in feet Extremities:  no edema Neurologic:  alert & oriented  X3, strength normal in all extremities, and gait normal.  Normal tone Skin:  no rashes and no suspicious lesions.   Axillary Nodes:  No palpable lymphadenopathy Psych:  normally interactive, good eye contact, not anxious  appearing, and not depressed appearing.     Impression & Recommendations:  Problem # 1:  PREVENTIVE HEALTH CARE (ICD-V70.0) Assessment Comment Only healthy keeps up with fitness  Problem # 2:  SEIZURE DISORDER (ICD-780.39) Assessment: Unchanged no seizures but we have to change meds will have her wean off mebaral as she starts depakote recheck 6 weeks with labs and evaluation limit individual driving for 2 weeks after off mebaral  The following medications were removed from the medication list:    Mebaral 100 Mg Tabs (Mephobarbital) .Marland Kitchen... 1 tab by mouth two times a day for seizure disorder Her updated medication list for this problem includes:    Divalproex Sodium 500 Mg Tbec (Divalproex sodium) .Marland Kitchen... 1 tab by mouth two times a day to prevent seizures  Problem # 3:  HYPERTENSION (ICD-401.9) Assessment: Unchanged up some today will recheck in 6 weeks no change for now  Her updated medication list for this problem includes:    Norvasc 5 Mg Tabs (Amlodipine besylate) .Marland Kitchen... Take 1 tablet by mouth once a day    Toprol Xl 100 Mg Tb24 (Metoprolol succinate) .Marland Kitchen... 1 daily    Cozaar 100 Mg Tabs (Losartan potassium) .Marland Kitchen... Take 1 tablet by mouth once daily  BP today: 165/85 Prior BP: 160/80 (12/31/2009)  Labs Reviewed: K+: 4.8 (06/28/2009) Creat: : 0.6 (06/28/2009)     Problem # 4:  OSTEOPOROSIS (ICD-733.00) Assessment: Unchanged on vitmain D med holiday from bisphosphonate after 10 years  The following medications were removed from the medication list:    Fosamax 35 Mg Tabs (Alendronate sodium) .Marland Kitchen... 1 weekly Her updated medication list for this problem includes:    Vitamin D (ergocalciferol) 50000 Unit Caps (Ergocalciferol) .Marland Kitchen... Take 1 by mouth two times a month  Complete Medication List: 1)  Norvasc 5 Mg Tabs (Amlodipine besylate) .... Take 1 tablet by mouth once a day 2)  Toprol Xl 100 Mg Tb24 (Metoprolol succinate) .Marland Kitchen.. 1 daily 3)  Cozaar 100 Mg Tabs (Losartan  potassium) .... Take 1 tablet by mouth once daily 4)  Vitamin D (ergocalciferol) 50000 Unit Caps (Ergocalciferol) .... Take 1 by mouth two times a month 5)  Estring Ring (Estradiol ring) .... Every 3 months 6)  Divalproex Sodium 500 Mg Tbec (Divalproex sodium) .Marland Kitchen.. 1 tab by mouth two times a day to prevent seizures  Patient Instructions: 1)  Please decrease the mebaral to 1 tab daily while starting the new medication (valproic acid). Take them both for 2 weeks and then stop the mebaral. 2)  Please don't drive yourself for about 2 weeks after going off the mebaral 3)  Call if you have any troubling side effects with the new med 4)  Please schedule a follow-up appointment in 6 weeks.  Prescriptions: DIVALPROEX SODIUM 500 MG TBEC (DIVALPROEX SODIUM) 1 tab by mouth two times a day to prevent seizures  #60 x 2   Entered and Authorized by:   Cindee Salt MD   Signed by:   Cindee Salt MD on 07/02/2010   Method used:   Electronically to        [facility] (retail)       [address]       [place], [place]  [address]       Ph: [phone]       Fax: [phone]   RxID:   [id number] VITAMIN D (ERGOCALCIFEROL) 50000 UNIT CAPS (ERGOCALCIFEROL) take 1 by mouth two times a month  #24 x 0   Entered and Authorized by:   Cindee Salt MD   Signed by:   Cindee Salt MD on 07/02/2010   Method used:   Electronically to        [facility] ([facility]       [address]       [place], [place]  [address]       Ph: [phone]       Fax: [phone]   RxID:   [id number] COZAAR 100 MG TABS (LOSARTAN POTASSIUM) take 1 tablet by mouth once daily  #90 x 3   Entered by:   Mervin Hack CMA (AAMA)   Authorized by:   Cindee Salt MD   Signed by:   Mervin Hack CMA (AAMA) on 07/02/2010   Method used:   Electronically to        [facility]* (retail)             ,           Ph: [phone]       Fax: [phone]   RxID:   [id number] NORVASC 5 MG TABS (AMLODIPINE BESYLATE) Take 1 tablet by mouth once a day  #90 x 3   Entered by:   Mervin Hack CMA (AAMA)   Authorized by:   Cindee Salt MD   Signed by:   Mervin Hack CMA (AAMA) on 07/02/2010   Method used:   Electronically to        [facility]* (retail)             ,          Ph: [phone]       Fax: [phone]   RxID:   [id number]    Orders Added: 1)  Est. Patient 65& > [93810] 2)  Est. Patient Level III [17510]   Immunization History:  Hepatitis B Immunization History:    Hepatitis B # 1:  HepB Adult (09/14/1997)    Hepatitis B # 2:  HepB Adult (10/16/1997)    Hepatitis B # 3:  HepB Adult (03/15/1998)  Tetanus/Td Immunization History:    Tetanus/Td:  Td (10/01/2006)  Influenza Immunization History:    Influenza:  Historical (04/03/2010)  Pneumovax Immunization History:    Pneumovax:  Pneumovax (03/23/2006)  Zostavax History:    Zostavax # 1:  Zostavax (05/17/2007)   Immunization History:  Zostavax History:    Zostavax # 1:  Zostavax (05/17/2007)  Current Allergies (reviewed today): ! DILANTIN

## 2010-07-09 NOTE — Letter (Signed)
Summary: Nature conservation officer [employer] Wellness Visit Questionnaire   [employer] Annual Wellness Visit Questionnaire   Imported By: Beau Fanny 07/02/2010 16:53:44  _____________________________________________________________________  External Attachment:    Type:   Image     Comment:   External Document

## 2010-07-11 ENCOUNTER — Telehealth (INDEPENDENT_AMBULATORY_CARE_PROVIDER_SITE_OTHER): Payer: Self-pay | Admitting: *Deleted

## 2010-07-15 ENCOUNTER — Telehealth: Payer: Self-pay | Admitting: Family Medicine

## 2010-07-18 NOTE — Progress Notes (Signed)
Summary: Medication   Phone Note From Pharmacy   Caller: [facility] [phone] Call For: Dr. Christella Hartigan  Summary of Call: Moviprep is too expensive...Marland Kitchenneeds an alternative med. Initial call taken by: Karna Christmas,  July 09, 2010 2:59 PM  Follow-up for Phone Call        Phone call completed Follow-up by: Wyona Almas RN,  July 09, 2010 3:07 PM

## 2010-07-18 NOTE — Miscellaneous (Signed)
Summary: Lec previsit  Clinical Lists Changes  Medications: Added new medication of MOVIPREP 100 GM  SOLR (PEG-KCL-NACL-NASULF-NA ASC-C) As per prep instructions. - Signed Rx of MOVIPREP 100 GM  SOLR (PEG-KCL-NACL-NASULF-NA ASC-C) As per prep instructions.;  #1 x 0;  Signed;  Entered by: Ulis Rias RN;  Authorized by: Rachael Fee MD;  Method used: [facility] to [id number] [address], [place], [place]  [address], Ph: [phone], Fax: [phone]    Prescriptions: MOVIPREP 100 GM  SOLR (PEG-KCL-NACL-NASULF-NA ASC-C) As per prep instructions.  #1 x 0   Entered by:   Ulis Rias RN   Authorized by:   Rachael Fee MD   Signed by:   Ulis Rias RN on 07/09/2010   Method used:   Electronically to        [id number] ([facility])       [address]       [place], [place]  [address]       Ph: [phone]       Fax: [phone]   RxID:   [id number]

## 2010-07-18 NOTE — Progress Notes (Signed)
Summary: ? re prep   Phone Note Call from Patient Call back at Home Phone [phone]   Caller: Patient Call For: Dr Christella Hartigan Reason for Call: Talk to Nurse Summary of Call: Patient wants to speak to nurse re: prep Initial call taken by: Tawni Levy,  July 11, 2010 11:32 AM  Follow-up for Phone Call        pt's questions were answered. Follow-up by: Ezra Sites RN,  July 11, 2010 11:50 AM

## 2010-07-18 NOTE — Letter (Signed)
Summary: [facility],The Instructions  [facility]  [address] [place], [place] [address]   Phone: [phone]  Fax: [phone]       Tanya Knight    1941/04/15    MRN: [id number]        Procedure Day /Date:  Tuesday 07/23/2010     Arrival Time: 9:30 am     Procedure Time: 10:30 am     Location of Procedure:                    _x _  [facility] (4th Floor)                        PREPARATION FOR COLONOSCOPY WITH MOVIPREP   Starting 5 days prior to your procedure Thursday 3/8 do not eat nuts, seeds, popcorn, corn, beans, peas,  salads, or any raw vegetables.  Do not take any fiber supplements (e.g. Metamucil, Citrucel, and Benefiber).  THE DAY BEFORE YOUR PROCEDURE         DATE: Monday 3/12  1.  Drink clear liquids the entire day-NO SOLID FOOD  2.  Do not drink anything colored red or purple.  Avoid juices with pulp.  No orange juice.  3.  Drink at least 64 oz. (8 glasses) of fluid/clear liquids during the day to prevent dehydration and help the prep work efficiently.  CLEAR LIQUIDS INCLUDE: Water Jello Ice Popsicles Tea (sugar ok, no milk/cream) Powdered fruit flavored drinks Coffee (sugar ok, no milk/cream) Gatorade Juice: apple, white grape, white cranberry  Lemonade Clear bullion, consomm, broth Carbonated beverages (any kind) Strained chicken noodle soup Hard Candy                             4.  In the morning, mix first dose of MoviPrep solution:    Empty 1 Pouch A and 1 Pouch B into the disposable container    Add lukewarm drinking water to the top line of the container. Mix to dissolve    Refrigerate (mixed solution should be used within 24 hrs)  5.  Begin drinking the prep at 5:00 p.m. The MoviPrep container is divided by 4 marks.   Every 15 minutes drink the solution down to the next mark (approximately 8 oz) until the full liter is complete.   6.  Follow completed prep with 16 oz of clear liquid of your choice (Nothing  red or purple).  Continue to drink clear liquids until bedtime.  7.  Before going to bed, mix second dose of MoviPrep solution:    Empty 1 Pouch A and 1 Pouch B into the disposable container    Add lukewarm drinking water to the top line of the container. Mix to dissolve    Refrigerate  THE DAY OF YOUR PROCEDURE      DATE: Tuesday 3/13  Beginning at 5:30 a.m. (5 hours before procedure):         1. Every 15 minutes, drink the solution down to the next mark (approx 8 oz) until the full liter is complete.  2. Follow completed prep with 16 oz. of clear liquid of your choice.    3. You may drink clear liquids until 8:30 am (2 HOURS BEFORE PROCEDURE).   MEDICATION INSTRUCTIONS  Unless otherwise instructed, you should take regular prescription medications with a small sip of water   as early as possible the morning of your  procedure.         OTHER INSTRUCTIONS  You will need a responsible adult at least 70 years of age to accompany you and drive you home.   This person must remain in the waiting room during your procedure.  Wear loose fitting clothing that is easily removed.  Leave jewelry and other valuables at home.  However, you may wish to bring a book to read or  an iPod/MP3 player to listen to music as you wait for your procedure to start.  Remove all body piercing jewelry and leave at home.  Total time from sign-in until discharge is approximately 2-3 hours.  You should go home directly after your procedure and rest.  You can resume normal activities the  day after your procedure.  The day of your procedure you should not:   Drive   Make legal decisions   Operate machinery   Drink alcohol   Return to work  You will receive specific instructions about eating, activities and medications before you leave.    The above instructions have been reviewed and explained to me by   Ulis Rias RN  July 09, 2010 1:10 PM     I fully understand and can  verbalize these instructions _____________________________ Date _________

## 2010-07-23 ENCOUNTER — Other Ambulatory Visit (AMBULATORY_SURGERY_CENTER): Payer: Medicare Other | Admitting: Gastroenterology

## 2010-07-23 ENCOUNTER — Other Ambulatory Visit: Payer: Self-pay | Admitting: Gastroenterology

## 2010-07-23 DIAGNOSIS — K620 Anal polyp: Secondary | ICD-10-CM

## 2010-07-23 DIAGNOSIS — D126 Benign neoplasm of colon, unspecified: Secondary | ICD-10-CM

## 2010-07-23 DIAGNOSIS — Z860100 Personal history of colon polyps, unspecified: Secondary | ICD-10-CM

## 2010-07-23 DIAGNOSIS — D128 Benign neoplasm of rectum: Secondary | ICD-10-CM

## 2010-07-23 DIAGNOSIS — D129 Benign neoplasm of anus and anal canal: Secondary | ICD-10-CM

## 2010-07-23 LAB — HM COLONOSCOPY

## 2010-07-23 NOTE — Progress Notes (Signed)
Summary: ? medicine reaction  Phone Note Call from Patient Call back at Home Phone [phone]   Caller: Patient Summary of Call: Pt states she had an episode saturday of feeling like she would pass out, felt very light headed for about 15 minutes.  She checked her BP and it was 196/86.  She has recently had to change her seizure medicine from mebaral to divalproex- has been taking one half dose of mebarel and one whole dose of divalproex, today is her last day on mebarel.  She is asking if the changes in the meds could have caused this reaction.  States her BP's have been ok since then, she has felt ok since then- other than a little headache. Initial call taken by: Lowella Petties CMA, AAMA,  July 15, 2010 9:18 AM  Follow-up for Phone Call        Unable to know for sure if this was a reaction to medication.  If it occurs again, please have her come in for office visit. Ruthe Mannan MD  July 15, 2010 9:21 AM  Patient advised as instructed via telephone. Follow-up by: Linde Gillis CMA Duncan Dull),  July 15, 2010 10:37 AM

## 2010-07-30 ENCOUNTER — Encounter: Payer: Self-pay | Admitting: Gastroenterology

## 2010-07-30 NOTE — Procedures (Addendum)
Summary: Colonoscopy  Patient: Tanya Knight Note: All result statuses are Final unless otherwise noted.  Tests: (1) Colonoscopy (COL)   COL Colonoscopy           DONE     [facility]     [address]     [place], [place]  [address]          COLONOSCOPY PROCEDURE REPORT          PATIENT:  Tanya Knight, Tanya Knight  MR#:  [id number]     BIRTHDATE:  07/11/1940, 69 yrs. old  GENDER:  female     ENDOSCOPIST:  Rachael Fee, MD     PROCEDURE DATE:  07/23/2010     PROCEDURE:  Colonoscopy with snare polypectomy     ASA CLASS:  Class II     INDICATIONS:  history of pre-cancerous (adenomatous) colon polyps,     2008     MEDICATIONS:   Fentanyl 50 mcg IV, Versed 8 mg IV          DESCRIPTION OF PROCEDURE:   After the risks benefits and     alternatives of the procedure were thoroughly explained, informed     consent was obtained.  Digital rectal exam was performed and     revealed no rectal masses.   The LB [id number]-[id number] endoscope     was introduced through the anus and advanced to the cecum, which     was identified by both the appendix and ileocecal valve, without     limitations.  The quality of the prep was good, using MoviPrep.     The instrument was then slowly withdrawn as the colon was fully     examined.     <<PROCEDUREIMAGES>>     FINDINGS:  Two sessile polyps were found. One was 4mm across,     located in ascending colon, removed with snare/cautery and sent to     pathology (jar 1). One was in rectum, likely this is recurrent     polyp from that which was removed in 2008 (reviewed imagages,     looks to be the same site). This was 8mm across, removed with     snare/cautery and sent to pathology (jar 2) (see image4, image6,     image7, and image8).  This was otherwise a normal examination of     the colon (see image2 and image3).   Retroflexed views in the     rectum revealed no abnormalities.    The scope was then withdrawn     from the patient and the procedure  completed.     COMPLICATIONS:  None          ENDOSCOPIC IMPRESSION:     1) Two sessile polyps, both removed and both sent to pathology.     One was in distal rectum and likely is recurrence of the polyp     located at same site, removed (with cautery) in 2008.     2) Otherwise normal examination          RECOMMENDATIONS:     Await final pathology. Given recurrence of the distal rectal     polyp, will likely want to repeat flex sigmoidoscopy in 1 year to     check the site.          ______________________________     Rachael Fee, MD          cc: Tillman Abide, MD  n.     eSIGNED:   Rachael Fee at 07/23/2010 10:38 AM          Tanya Knight, [id number]  Note: An exclamation mark (!) indicates a result that was not dispersed into the flowsheet. Document Creation Date: 07/23/2010 10:38 AM _______________________________________________________________________  (1) Order result status: Final Collection or observation date-time: 07/23/2010 10:30 Requested date-time:  Receipt date-time:  Reported date-time:  Referring Physician:   Ordering Physician: Rob Bunting [phone]) Specimen Source:  Source: Launa Grill Order Number: [id number] Lab site:   Appended Document: Colonoscopy     Procedures Next Due Date:    Flexible Sigmoidoscopy: 07/2011

## 2010-07-31 ENCOUNTER — Telehealth: Payer: Self-pay | Admitting: *Deleted

## 2010-07-31 NOTE — Telephone Encounter (Signed)
That is fine 

## 2010-07-31 NOTE — Telephone Encounter (Signed)
Patient notified

## 2010-07-31 NOTE — Telephone Encounter (Signed)
Patient was seen on 07-02-10 and was told to follow up in 6 weeks, which she had already scheduled, but rescheduled for the next week, which would make in 7 weeks, she wanted you to know and to make sure you are okay with that.

## 2010-08-08 NOTE — Letter (Signed)
Summary: Results Letter  [facility]  [address] [place], [place] [address]   Phone: [phone]  Fax: [phone]        July 30, 2010 MRN: [id number]    Tanya Knight [address] [place], [place]  [address]    Dear Ms. Dunckel,   The polyp(s) removed during your recent procedure were proven to be adenomatous.  These are pre-cancerous polyps that may have grown into cancers if they had not been removed.  Based on current nationally recognized surveillance guidelines, I recommend that you have a repeat flexible sigmoidoscopy in 1 year.   We will therefore put your information in our reminder system and will contact you in 1 year to schedule a repeat procedure.  Please call if you have any questions or concerns.       Sincerely,  Rachael Fee MD  This letter has been electronically signed by your physician.  Appended Document: Results Letter letter mailed

## 2010-08-19 ENCOUNTER — Ambulatory Visit: Payer: Medicare Other | Admitting: Internal Medicine

## 2010-08-26 ENCOUNTER — Encounter: Payer: Self-pay | Admitting: Internal Medicine

## 2010-08-29 ENCOUNTER — Encounter: Payer: Self-pay | Admitting: Internal Medicine

## 2010-08-29 ENCOUNTER — Ambulatory Visit (INDEPENDENT_AMBULATORY_CARE_PROVIDER_SITE_OTHER): Payer: Medicare Other | Admitting: Internal Medicine

## 2010-08-29 VITALS — BP 128/70 | HR 62 | Temp 97.7°F | Ht 61.0 in | Wt 117.0 lb

## 2010-08-29 DIAGNOSIS — I10 Essential (primary) hypertension: Secondary | ICD-10-CM

## 2010-08-29 DIAGNOSIS — R569 Unspecified convulsions: Secondary | ICD-10-CM

## 2010-08-29 MED ORDER — DIVALPROEX SODIUM 500 MG PO DR TAB: 500.0000 mg | DELAYED_RELEASE_TABLET | Freq: Two times a day (BID) | ORAL | Status: DC

## 2010-08-29 NOTE — Progress Notes (Signed)
  Subjective:    Patient ID: Tanya Knight, female    DOB: Oct 07, 1940, 70 y.o.   MRN: [id number]  HPI Had elevated BPs when she was switching over to the depakote Had dizziness---had to sit to avoid fainting BP 196/80 once Otherwise not higher than ~160 systolic  BPs now back in the 914'N systolic--better than ever  No problems on the new med No seizures  Current outpatient prescriptions:amLODipine (NORVASC) 5 MG tablet, Take 5 mg by mouth daily.  , Disp: , Rfl: ;  divalproex (DEPAKOTE) 500 MG EC tablet, Take 500 mg by mouth 2 (two) times daily.  , Disp: , Rfl: ;  ergocalciferol (VITAMIN D2) 50000 UNITS capsule, Take 50,000 Units by mouth. Twice monthly , Disp: , Rfl: ;  estradiol (ESTRING) 2 MG vaginal ring, Place 2 mg vaginally every 3 (three) months. follow package directions , Disp: , Rfl:  losartan (COZAAR) 100 MG tablet, Take 100 mg by mouth daily.  , Disp: , Rfl: ;  metoprolol (TOPROL-XL) 100 MG 24 hr tablet, Take 100 mg by mouth daily.  , Disp: , Rfl: ;  Moxifloxacin HCl (MOXEZA) 0.5 % (2X DAY) SOLN, Apply 1 drop to eye 3 (three) times daily.  , Disp: , Rfl:   /\ Past Medical History  Diagnosis Date  . GERD (gastroesophageal reflux disease)   . Hypertension   . Osteoporosis   . Seizure disorder   . Migraines   . Vitamin D deficiency   . Fracture of metatarsal     Repair of fractured R metatarsal --Dr Duda---4/08    Past Surgical History  Procedure Date  . Tonsillectomy and adenoidectomy 1948    Family History  Problem Relation Age of Onset  . Hypertension Mother   . Cancer Daughter     breast cancer  . Diabetes Maternal Aunt   . Coronary artery disease Paternal Aunt   . Coronary artery disease Paternal Uncle   . Heart disease Maternal Grandmother   . Heart disease Maternal Grandfather   . Heart disease Paternal Grandmother   . Heart disease Paternal Grandfather     History   Social History  . Marital Status: Married    Spouse Name: N/A    Number of  Children: 3  . Years of Education: N/A   Occupational History  . retired Geologist, engineering    Social History Main Topics  . Smoking status: Never Smoker   . Smokeless tobacco: Never Used  . Alcohol Use: Yes     occasional  . Drug Use: Not on file  . Sexually Active: Not on file   Other Topics Concern  . Not on file   Social History Narrative   Regular exercise-yes---aerobics, Pilates, jogged in past (now walks)   Review of Systems Appetite is fine Sleeping okay--more dreams and more intense Weight is stable     Objective:   Physical Exam  Constitutional: She appears well-developed and well-nourished.  Neurological: She exhibits normal muscle tone.       Normal strength and gait  Psychiatric: She has a normal mood and affect. Her behavior is normal. Judgment and thought content normal.          Assessment & Plan:

## 2010-08-30 LAB — CBC WITH DIFFERENTIAL/PLATELET
Basophils Absolute: 0.1 10*3/uL (ref 0.0–0.1)
Eosinophils Absolute: 0.7 10*3/uL (ref 0.0–0.7)
HCT: 40.2 % (ref 36.0–46.0)
Lymphs Abs: 1.8 10*3/uL (ref 0.7–4.0)
MCHC: 34.8 g/dL (ref 30.0–36.0)
MCV: 96.4 fl (ref 78.0–100.0)
Monocytes Absolute: 0.5 10*3/uL (ref 0.1–1.0)
Neutrophils Relative %: 53.2 % (ref 43.0–77.0)
Platelets: 213 10*3/uL (ref 150.0–400.0)
RDW: 12.9 % (ref 11.5–14.6)

## 2010-08-30 LAB — BASIC METABOLIC PANEL
CO2: 32 mEq/L (ref 19–32)
Calcium: 9.6 mg/dL (ref 8.4–10.5)
Creatinine, Ser: 0.5 mg/dL (ref 0.4–1.2)
Glucose, Bld: 75 mg/dL (ref 70–99)

## 2010-08-30 LAB — HEPATIC FUNCTION PANEL
ALT: 23 U/L (ref 0–35)
AST: 30 U/L (ref 0–37)
Albumin: 4.3 g/dL (ref 3.5–5.2)
Alkaline Phosphatase: 94 U/L (ref 39–117)
Total Bilirubin: 0.4 mg/dL (ref 0.3–1.2)

## 2010-08-30 LAB — VALPROIC ACID LEVEL: Valproic Acid Lvl: 78.2 ug/mL (ref 50.0–100.0)

## 2010-08-30 LAB — TSH: TSH: 2.2 u[IU]/mL (ref 0.35–5.50)

## 2011-02-09 ENCOUNTER — Other Ambulatory Visit: Payer: Self-pay | Admitting: Internal Medicine

## 2011-02-10 HISTORY — PX: TEAR DUCT PROBING: SHX793

## 2011-03-03 ENCOUNTER — Encounter: Payer: Self-pay | Admitting: Internal Medicine

## 2011-03-03 ENCOUNTER — Ambulatory Visit (INDEPENDENT_AMBULATORY_CARE_PROVIDER_SITE_OTHER): Payer: Medicare Other | Admitting: Internal Medicine

## 2011-03-03 VITALS — BP 156/75 | HR 54 | Temp 97.8°F | Ht 61.0 in | Wt 117.0 lb

## 2011-03-03 DIAGNOSIS — S46819A Strain of other muscles, fascia and tendons at shoulder and upper arm level, unspecified arm, initial encounter: Secondary | ICD-10-CM

## 2011-03-03 DIAGNOSIS — I10 Essential (primary) hypertension: Secondary | ICD-10-CM

## 2011-03-03 DIAGNOSIS — R569 Unspecified convulsions: Secondary | ICD-10-CM

## 2011-03-03 MED ORDER — DIVALPROEX SODIUM 250 MG PO DR TAB: DELAYED_RELEASE_TABLET | ORAL | Status: DC

## 2011-03-03 NOTE — Assessment & Plan Note (Signed)
No seizures Seems to have some CNS side effects with depakote and level was ~76 Will decrease to 250/500

## 2011-03-03 NOTE — Progress Notes (Signed)
Subjective:    Patient ID: Tanya Knight, female    DOB: 28-Mar-1941, 70 y.o.   MRN: [id number]  HPI Doing well Continues to exercise regularly  Having some left neck pain for 3 months or so Radiates towards shoulder Has tried to cut down on weight work [employer] "raw, angry, exposed" May grab her with movement of neck No radiation into arm No change in use of arm Tried some nuprin--but off with surgery pending  Getting dacryocystorhinostomy---tear duct being redone  Has been monitoring BP regularly Only high (174/49) when back was hurting Average 141/72 No headaches No chest pain  No SOB  No seizures on new med She can tell the depakote "affects my mind" Has to work to organize things occ finds decreased energy  Current Outpatient Prescriptions on File Prior to Visit  Medication Sig Dispense Refill  . amLODipine (NORVASC) 5 MG tablet Take 5 mg by mouth daily.        . divalproex (DEPAKOTE) 500 MG EC tablet Take 1 tablet (500 mg total) by mouth 2 (two) times daily.  180 tablet  3  . ergocalciferol (VITAMIN D2) 50000 UNITS capsule Take 50,000 Units by mouth. Twice monthly       . estradiol (ESTRING) 2 MG vaginal ring Place 2 mg vaginally every 3 (three) months. follow package directions       . losartan (COZAAR) 100 MG tablet Take 100 mg by mouth daily.        . metoprolol (TOPROL-XL) 100 MG 24 hr tablet TAKE 1 TABLET DAILY  102 tablet  2    Allergies  Allergen Reactions  . Phenytoin     REACTION: severe reaction    Past Medical History  Diagnosis Date  . GERD (gastroesophageal reflux disease)   . Hypertension   . Osteoporosis   . Seizure disorder   . Migraines   . Vitamin D deficiency   . Fracture of metatarsal     Repair of fractured R metatarsal --Dr Duda---4/08    Past Surgical History  Procedure Date  . Tonsillectomy and adenoidectomy 1948    Family History  Problem Relation Age of Onset  . Hypertension Mother   . Cancer Daughter     breast cancer    . Diabetes Maternal Aunt   . Coronary artery disease Paternal Aunt   . Coronary artery disease Paternal Uncle   . Heart disease Maternal Grandmother   . Heart disease Maternal Grandfather   . Heart disease Paternal Grandmother   . Heart disease Paternal Grandfather     History   Social History  . Marital Status: Married    Spouse Name: N/A    Number of Children: 3  . Years of Education: N/A   Occupational History  . retired Geologist, engineering    Social History Main Topics  . Smoking status: Never Smoker   . Smokeless tobacco: Never Used  . Alcohol Use: Yes     occasional  . Drug Use: Not on file  . Sexually Active: Not on file   Other Topics Concern  . Not on file   Social History Narrative   Regular exercise-yes---aerobics, Pilates, jogged in past (now walks)   Review of Systems Did pass out once while at [employer] and 100 degrees Sleeping okay---thinks the med does this (makes her sleepy at times Weight stable    Objective:   Physical Exam  Constitutional: She appears well-developed and well-nourished. No distress.  Neck: Normal range of motion. Neck  supple.       Some tenderness along left trapezius and scapula area Full ROM though  Cardiovascular: Normal rate, regular rhythm and normal heart sounds.  Exam reveals no gallop.   No murmur heard. Pulmonary/Chest: Effort normal and breath sounds normal. No respiratory distress. She has no wheezes. She has no rales.  Musculoskeletal: Normal range of motion. She exhibits no edema and no tenderness.  Psychiatric: She has a normal mood and affect. Her behavior is normal. Judgment and thought content normal.          Assessment & Plan:

## 2011-03-03 NOTE — Assessment & Plan Note (Signed)
Seems muscular Ibuprofen helps Discussed heat, massage No evidence of disc disease or nerve involevement

## 2011-03-03 NOTE — Assessment & Plan Note (Signed)
BP Readings from Last 3 Encounters:  03/03/11 156/75  08/29/10 128/70  07/02/10 165/85   Has had reasonable control Home readings are good Has had some dizziness in the heat---would not increase meds

## 2011-03-19 ENCOUNTER — Encounter: Payer: Self-pay | Admitting: Internal Medicine

## 2011-03-20 ENCOUNTER — Encounter: Payer: Self-pay | Admitting: Obstetrics and Gynecology

## 2011-03-24 ENCOUNTER — Encounter: Payer: Self-pay | Admitting: *Deleted

## 2011-04-15 ENCOUNTER — Ambulatory Visit (INDEPENDENT_AMBULATORY_CARE_PROVIDER_SITE_OTHER): Payer: Medicare Other | Admitting: Obstetrics and Gynecology

## 2011-04-15 ENCOUNTER — Encounter: Payer: Self-pay | Admitting: Obstetrics and Gynecology

## 2011-04-15 VITALS — BP 134/82 | Ht 62.0 in | Wt 121.0 lb

## 2011-04-15 DIAGNOSIS — N951 Menopausal and female climacteric states: Secondary | ICD-10-CM

## 2011-04-15 DIAGNOSIS — M810 Age-related osteoporosis without current pathological fracture: Secondary | ICD-10-CM

## 2011-04-15 DIAGNOSIS — Z780 Asymptomatic menopausal state: Secondary | ICD-10-CM

## 2011-04-15 DIAGNOSIS — E559 Vitamin D deficiency, unspecified: Secondary | ICD-10-CM

## 2011-04-15 DIAGNOSIS — N952 Postmenopausal atrophic vaginitis: Secondary | ICD-10-CM

## 2011-04-15 MED ORDER — ESTRADIOL 2 MG VA RING: 2.0000 mg | VAGINAL_RING | VAGINAL | Status: DC

## 2011-04-15 NOTE — Progress Notes (Signed)
Subjective:     Patient ID: Tanya Knight, female   DOB: 12-Jun-1940, 70 y.o.   MRN: [id number]  HPIpatient came back today for further followup. She continues with the Estring with excellent results for her vaginal dryness. She is having no vaginal bleeding. She is having no pelvic pain. She said that a mammograms and bone densities. She is now on drug holiday after 10 years of Fosamax. She continues to see Dr. Valarie Cones. She has minimal hot flashes now. They're tolerable without HRT. She continues on her vitamin D for vitamin D deficiency without any symptoms. She also takes calcium. She's had no fractures.   Review of Systems  Constitutional: Negative.   HENT: Negative.   Eyes:       Patient had excessive eye tearing. She has undergone surgery and has a stent currently in her tear duct.  Respiratory: Negative.   Cardiovascular:       Hypertension  Gastrointestinal:       GERD  Genitourinary: Negative.   Musculoskeletal: Negative.   Skin: Negative.   Neurological:       Seizures. On Depakote with good results. Her doses just been reduced  Hematological: Negative.   Psychiatric/Behavioral: Negative.        Objective:   Physical ExamPhysical examination: Kennon Portela present HEENT within normal limits. Neck: Thyroid not large. No masses. Supraclavicular nodes: not enlarged. Breasts: Examined in both sitting midline position. No skin changes and no masses. Abdomen: Soft no guarding rebound or masses or hernia. Pelvic: External: Within normal limits. BUS: Within normal limits. Vaginal:within normal limits. Good estrogen effect. No evidence of cystocele rectocele or enterocele. Cervix: clean. Uterus: Normal size and shape. Adnexa: No masses. Rectovaginal exam: Confirmatory and negative. Extremities: Within normal limits.     Assessment:     #1. Atrophic vaginitis #2. Osteoporosis #3. Vitamin D deficiency #4. Menopausal symptoms    Plan:    mammograms yearly. Continue vitamin D.  Continue Estring. Periodic bone densities. Patient to continue see Dr. Valarie Cones every 2 years.

## 2011-06-04 ENCOUNTER — Other Ambulatory Visit: Payer: Self-pay | Admitting: Internal Medicine

## 2011-06-23 ENCOUNTER — Encounter: Payer: Self-pay | Admitting: Gastroenterology

## 2011-07-03 ENCOUNTER — Encounter: Payer: Self-pay | Admitting: Gastroenterology

## 2011-08-11 ENCOUNTER — Ambulatory Visit (AMBULATORY_SURGERY_CENTER): Payer: Medicare Other | Admitting: *Deleted

## 2011-08-11 VITALS — Ht 61.0 in | Wt 122.2 lb

## 2011-08-11 DIAGNOSIS — Z860100 Personal history of colon polyps, unspecified: Secondary | ICD-10-CM

## 2011-08-11 DIAGNOSIS — Z1211 Encounter for screening for malignant neoplasm of colon: Secondary | ICD-10-CM

## 2011-08-22 ENCOUNTER — Ambulatory Visit (AMBULATORY_SURGERY_CENTER): Payer: Medicare Other | Admitting: Gastroenterology

## 2011-08-22 ENCOUNTER — Encounter: Payer: Self-pay | Admitting: Gastroenterology

## 2011-08-22 VITALS — BP 150/78 | HR 51 | Temp 95.9°F | Resp 16 | Ht 61.0 in | Wt 122.0 lb

## 2011-08-22 DIAGNOSIS — D128 Benign neoplasm of rectum: Secondary | ICD-10-CM

## 2011-08-22 DIAGNOSIS — D129 Benign neoplasm of anus and anal canal: Secondary | ICD-10-CM

## 2011-08-22 DIAGNOSIS — Z1211 Encounter for screening for malignant neoplasm of colon: Secondary | ICD-10-CM

## 2011-08-22 DIAGNOSIS — Z860100 Personal history of colon polyps, unspecified: Secondary | ICD-10-CM

## 2011-08-22 DIAGNOSIS — D126 Benign neoplasm of colon, unspecified: Secondary | ICD-10-CM

## 2011-08-22 MED ORDER — SODIUM CHLORIDE 0.9 % IV SOLN: 500.0000 mL | INTRAVENOUS | Status: DC

## 2011-08-22 NOTE — Progress Notes (Signed)
Patient did not experience any of the following events: a burn prior to discharge; a fall within the facility; wrong site/side/patient/procedure/implant event; or a hospital transfer or hospital admission upon discharge from the facility. (G8907) Patient did not have preoperative order for IV antibiotic SSI prophylaxis. (G8918)  

## 2011-08-22 NOTE — Op Note (Signed)
[facility] [address] [place], [place]  [address]  FLEXIBLE SIGMOIDOSCOPY PROCEDURE REPORT  PATIENT:  Tanya Knight, Tanya Knight  MR#:  [id number] BIRTHDATE:  [birth year], 70 yrs. old  GENDER:  female ENDOSCOPIST:  Rachael Fee, MD PROCEDURE DATE:  08/22/2011 PROCEDURE:  Flexible Sigmoidoscopy with polypectomy ASA CLASS:  Class II INDICATIONS:  2008 adenomas; 2012 repeat colonsocopy found recurrent TVA at rectal polyp site MEDICATIONS:   Fentanyl 50 mcg IV, These medications were titrated to patient response per physician's verbal order, Versed 6 mg IV  DESCRIPTION OF PROCEDURE:   After the risks benefits and alternatives of the procedure were thoroughly explained, informed consent was obtained.  Digital rectal exam was performed and revealed no rectal masses.   The [id number] endoscope was introduced through the anus and advanced to the splenic flexure, without limitations.  The quality of the prep was good. The instrument was then slowly withdrawn as the mucosa was fully examined. <<PROCEDUREIMAGES>>  A sessile polyp was found in the rectum. This was likely recurrent adenoma (same site as previous polyp removals in distal rectum). The polyp was 3mm across, removed with snare/cautery and then the site was treated with further cautery using hot biopsy forceps (see image2 and image6).   Retroflexed views in the rectum revealed no abnormalities.    The scope was then withdrawn from the patient and the procedure terminated. COMPLICATIONS:  None  ENDOSCOPIC IMPRESSION: 1) Small residual (vs recurrent) polyp in distal rectum. Removed and then site was cauterized.  RECOMMENDATIONS: Repeat colonoscopy in 2 years (3 years from the last full colonscopy), pending final pathology  REPEAT EXAM:  2 years  ______________________________ Rachael Fee, MD  CC:  n. eSIGNED:   Rachael Fee at 08/22/2011 10:37 AM  Elwyn Reach, [id number]

## 2011-08-22 NOTE — Patient Instructions (Signed)

## 2011-08-25 ENCOUNTER — Telehealth: Payer: Self-pay | Admitting: *Deleted

## 2011-08-25 NOTE — Telephone Encounter (Signed)
  Follow up Call-  Call back number 08/22/2011  Post procedure Call Back phone  # [phone]  Permission to leave phone message Yes     Left message.

## 2011-08-29 ENCOUNTER — Encounter: Payer: Self-pay | Admitting: Gastroenterology

## 2011-09-03 ENCOUNTER — Encounter: Payer: Self-pay | Admitting: Internal Medicine

## 2011-09-03 ENCOUNTER — Ambulatory Visit (INDEPENDENT_AMBULATORY_CARE_PROVIDER_SITE_OTHER): Payer: Medicare Other | Admitting: Internal Medicine

## 2011-09-03 VITALS — BP 118/68 | HR 58 | Temp 97.8°F | Ht 61.0 in | Wt 116.0 lb

## 2011-09-03 DIAGNOSIS — R569 Unspecified convulsions: Secondary | ICD-10-CM

## 2011-09-03 DIAGNOSIS — Z0000 Encounter for general adult medical examination without abnormal findings: Secondary | ICD-10-CM | POA: Insufficient documentation

## 2011-09-03 DIAGNOSIS — I10 Essential (primary) hypertension: Secondary | ICD-10-CM

## 2011-09-03 DIAGNOSIS — M810 Age-related osteoporosis without current pathological fracture: Secondary | ICD-10-CM

## 2011-09-03 LAB — CBC WITH DIFFERENTIAL/PLATELET
Basophils Relative: 1.2 % (ref 0.0–3.0)
Eosinophils Absolute: 0.4 10*3/uL (ref 0.0–0.7)
Lymphs Abs: 1.1 10*3/uL (ref 0.7–4.0)
MCHC: 34.3 g/dL (ref 30.0–36.0)
MCV: 94.1 fl (ref 78.0–100.0)
Monocytes Absolute: 0.5 10*3/uL (ref 0.1–1.0)
Neutrophils Relative %: 62.6 % (ref 43.0–77.0)
Platelets: 202 10*3/uL (ref 150.0–400.0)
RBC: 4.22 Mil/uL (ref 3.87–5.11)

## 2011-09-03 LAB — BASIC METABOLIC PANEL
BUN: 14 mg/dL (ref 6–23)
CO2: 23 mEq/L (ref 19–32)
Chloride: 97 mEq/L (ref 96–112)
Creatinine, Ser: 0.5 mg/dL (ref 0.4–1.2)

## 2011-09-03 LAB — LIPID PANEL
HDL: 96 mg/dL (ref >39.00)
Total CHOL/HDL Ratio: 2
VLDL: 8.6 mg/dL (ref 0.0–40.0)

## 2011-09-03 LAB — HEPATIC FUNCTION PANEL
ALT: 23 U/L (ref 0–35)
Albumin: 4.2 g/dL (ref 3.5–5.2)
Total Bilirubin: 0.5 mg/dL (ref 0.3–1.2)
Total Protein: 6.6 g/dL (ref 6.0–8.3)

## 2011-09-03 MED ORDER — ERGOCALCIFEROL 1.25 MG (50000 UT) PO CAPS: 50000.0000 [IU] | ORAL_CAPSULE | ORAL | Status: DC

## 2011-09-03 MED ORDER — METOPROLOL SUCCINATE ER 100 MG PO TB24: 100.0000 mg | ORAL_TABLET | Freq: Every day | ORAL | Status: DC

## 2011-09-03 NOTE — Assessment & Plan Note (Signed)
BP Readings from Last 3 Encounters:  09/03/11 118/68  08/22/11 150/78  04/15/11 134/82   Good control now Consider weaning meds next time if still down

## 2011-09-03 NOTE — Progress Notes (Signed)
Subjective:    Patient ID: Tanya Knight, female    DOB: Nov 05, 1940, 71 y.o.   MRN: [id number]  HPI Here for wellness visit UTD on cancer screening occ problems with whispered words but no major hearing problems Vision is fine Reviewed advanced directives No falls and exercises regularly and not concerned about falls  Still happy with valproate for seizures Does down to 750mg  daily No seizures  BP is good now Has checked herself---- 124/70-143/82 Average is 132.73 No headaches  No chest pain No SOB No dizziness or syncope  Continues on estring and vitamin D Sees Dr Olegario Messier does do breast exam  Current Outpatient Prescriptions on File Prior to Visit  Medication Sig Dispense Refill  . amLODipine (NORVASC) 5 MG tablet TAKE 1 TABLET DAILY  90 tablet  2  . aspirin 81 MG tablet Take 81 mg by mouth daily.        Marland Kitchen CALCIUM PO Take by mouth.        . divalproex (DEPAKOTE) 250 MG DR tablet 1 tab in the morning and 2 in the evening  270 tablet  3  . estradiol (ESTRING) 2 MG vaginal ring Place 2 mg vaginally every 3 (three) months. follow package directions  1 each  4  . losartan (COZAAR) 100 MG tablet TAKE 1 TABLET DAILY  90 tablet  2  . Multiple Vitamin (MULTIVITAMIN) capsule Take 1 capsule by mouth daily.        . Multiple Vitamins-Minerals (OCUVITE PO) Take by mouth.        . Omega-3 Fatty Acids (FISH OIL PO) Take by mouth.        . DISCONTD: metoprolol (TOPROL-XL) 100 MG 24 hr tablet TAKE 1 TABLET DAILY  102 tablet  2    Allergies  Allergen Reactions  . Dilantin Swelling and Rash    Past Medical History  Diagnosis Date  . GERD (gastroesophageal reflux disease)   . Osteoporosis   . Seizure disorder   . Vitamin d deficiency   . Fracture of metatarsal     Repair of fractured R metatarsal --Dr Duda---4/08  . Hypertension   . Seizures     Past Surgical History  Procedure Date  . Tonsillectomy and adenoidectomy 1948  . Foot surgery 2008  . Eye surgery    Obstructed tear duct  . Tear duct probing 10/12    temporary stent    Family History  Problem Relation Age of Onset  . Hypertension Mother   . Cancer Daughter     breast cancer  . Breast cancer Daughter   . Diabetes Maternal Aunt   . Coronary artery disease Paternal Aunt   . Breast cancer Paternal Aunt   . Coronary artery disease Paternal Uncle   . Heart disease Maternal Grandmother   . Heart disease Maternal Grandfather   . Heart disease Paternal Grandmother   . Heart disease Paternal Grandfather   . Heart disease Father   . Colon cancer Neg Hx   . Stomach cancer Neg Hx     History   Social History  . Marital Status: Married    Spouse Name: N/A    Number of Children: 3  . Years of Education: N/A   Occupational History  . retired Geologist, engineering    Social History Main Topics  . Smoking status: Never Smoker   . Smokeless tobacco: Never Used  . Alcohol Use: Yes     occasional  . Drug Use: No  . Sexually Active:  Yes    Birth Control/ Protection: Post-menopausal   Other Topics Concern  . Not on file   Social History Narrative   Regular exercise-yes---aerobics, Pilates, jogged in past (now walks)Has living willHusband, then one of her children, has health care POAWould accept resuscitation but no prolonged artificial ventilationProbably would not want tube feeds if cognitively unaware   Review of Systems Weight back down where she prefers Sleeps well---but naps easily (anytime she sits at times) Nocturia x 1 Bowels have been okay with lots of dietary fiber and liquids     Objective:   Physical Exam  Constitutional: She is oriented to person, place, and time. She appears well-developed and well-nourished. No distress.  Neck: Normal range of motion. Neck supple. No thyromegaly present.  Cardiovascular: Normal rate, regular rhythm, normal heart sounds and intact distal pulses.  Exam reveals no gallop.   No murmur heard. Pulmonary/Chest: Effort normal and  breath sounds normal. No respiratory distress. She has no wheezes. She has no rales.  Abdominal: Soft. There is no tenderness.  Musculoskeletal: She exhibits no edema and no tenderness.  Lymphadenopathy:    She has no cervical adenopathy.  Neurological: She is alert and oriented to person, place, and time.       President "Obama, [place], [place]" [phone] (slow and with difficulty) D-l-r-[id number]-w Recall 3/3  Skin: No rash noted.  Psychiatric: She has a normal mood and affect. Her behavior is normal.          Assessment & Plan:

## 2011-09-03 NOTE — Assessment & Plan Note (Signed)
I have personally reviewed the Medicare Annual Wellness questionnaire and have noted 1. The patient's medical and social history 2. Their use of alcohol, tobacco or illicit drugs 3. Their current medications and supplements 4. The patient's functional ability including ADL's, fall risks, home safety risks and hearing or visual             impairment. 5. Diet and physical activities 6. Evidence for depression or mood disorders  The patients weight, height, BMI and visual acuity have been recorded in the chart I have made referrals, counseling and provided education to the patient based review of the above and I have provided the pt with a written personalized care plan for preventive services.  I have provided you with a copy of your personalized plan for preventive services. Please take the time to review along with your updated medication list.  No concerns UTD on preventative care

## 2011-09-03 NOTE — Assessment & Plan Note (Signed)
Quiet on depakote Will check levels

## 2011-09-03 NOTE — Assessment & Plan Note (Signed)
On fosamax holiday after Rx for 10 years or so Will go back to Dr Valarie Cones again at least once more ?consider prolia if DEXA worsens next time---due in 1-2 years

## 2011-09-04 ENCOUNTER — Encounter: Payer: Self-pay | Admitting: *Deleted

## 2011-09-04 LAB — VALPROIC ACID LEVEL: Valproic Acid Lvl: 92 ug/mL (ref 50.0–100.0)

## 2012-01-28 ENCOUNTER — Other Ambulatory Visit: Payer: Self-pay | Admitting: Internal Medicine

## 2012-04-02 ENCOUNTER — Encounter: Payer: Self-pay | Admitting: Internal Medicine

## 2012-04-02 ENCOUNTER — Encounter: Payer: Self-pay | Admitting: Obstetrics and Gynecology

## 2012-04-05 ENCOUNTER — Encounter: Payer: Self-pay | Admitting: *Deleted

## 2012-04-16 ENCOUNTER — Other Ambulatory Visit: Payer: Self-pay | Admitting: Internal Medicine

## 2012-05-12 HISTORY — PX: CATARACT EXTRACTION, BILATERAL: SHX1313

## 2012-06-02 ENCOUNTER — Encounter: Payer: Self-pay | Admitting: Internal Medicine

## 2012-06-17 ENCOUNTER — Ambulatory Visit (INDEPENDENT_AMBULATORY_CARE_PROVIDER_SITE_OTHER): Payer: Medicare Other | Admitting: Internal Medicine

## 2012-06-17 ENCOUNTER — Encounter: Payer: Self-pay | Admitting: Internal Medicine

## 2012-06-17 VITALS — BP 160/90 | HR 60 | Temp 98.2°F | Wt 112.0 lb

## 2012-06-17 DIAGNOSIS — M810 Age-related osteoporosis without current pathological fracture: Secondary | ICD-10-CM

## 2012-06-17 DIAGNOSIS — M542 Cervicalgia: Secondary | ICD-10-CM

## 2012-06-17 DIAGNOSIS — R569 Unspecified convulsions: Secondary | ICD-10-CM

## 2012-06-17 MED ORDER — IBANDRONATE SODIUM 150 MG PO TABS: 150.0000 mg | ORAL_TABLET | ORAL | Status: DC

## 2012-06-17 MED ORDER — DIVALPROEX SODIUM 250 MG PO DR TAB
500.0000 mg | DELAYED_RELEASE_TABLET | Freq: Two times a day (BID) | ORAL | Status: DC
Start: 2012-06-17 — End: 2013-04-28

## 2012-06-17 MED ORDER — IBANDRONATE SODIUM 150 MG PO TABS: ORAL_TABLET | ORAL | Status: DC

## 2012-06-17 MED ORDER — METOPROLOL SUCCINATE ER 100 MG PO TB24: 100.0000 mg | ORAL_TABLET | Freq: Every day | ORAL | Status: DC

## 2012-06-17 NOTE — Addendum Note (Signed)
Addended by: Sueanne Margarita on: 06/17/2012 04:24 PM   Modules accepted: Orders

## 2012-06-17 NOTE — Progress Notes (Signed)
Subjective:    Patient ID: Tanya Knight, female    DOB: 09/14/1940, 72 y.o.   MRN: [id number]  HPI Has several issues  Having neck pain Really sore before Christmas---tried rest, ice and heat. Uses aleve for a week or so Did seem to improve to a tolerable level and was able to sleep Was getting used to walking stiff necked---hard to rotate  Saw Dr Valarie Cones last month Wanted her to restart boniva for bones Gave her exercises to do--not really helping (3 weeks later now)  Pain along right head and down trapezius---but also seems like inside the right side of throat No radiation to arm No arm weakness  On Jan 7th--had a "wave of something" come over her head Reminder her of past feelings with her seizures---but not an aura (just a weakness) Didn't pass out No seizure Did increase her depakote back to 1000mg   Current Outpatient Prescriptions on File Prior to Visit  Medication Sig Dispense Refill  . amLODipine (NORVASC) 5 MG tablet TAKE 1 TABLET DAILY  90 tablet  3  . aspirin 81 MG tablet Take 81 mg by mouth daily.        Marland Kitchen CALCIUM PO Take by mouth.        . divalproex (DEPAKOTE) 250 MG DR tablet TAKE 1 TABLET IN THE MORNING AND 2 TABLETS IN THE EVENING  270 tablet  3  . estradiol (ESTRING) 2 MG vaginal ring Place 2 mg vaginally every 3 (three) months. follow package directions  1 each  4  . losartan (COZAAR) 100 MG tablet TAKE 1 TABLET DAILY  90 tablet  3  . metoprolol succinate (TOPROL-XL) 100 MG 24 hr tablet Take 1 tablet (100 mg total) by mouth daily. Take with or immediately following a meal.  90 tablet  3  . Multiple Vitamin (MULTIVITAMIN) capsule Take 1 capsule by mouth daily.        . Multiple Vitamins-Minerals (OCUVITE PO) Take by mouth.        . Omega-3 Fatty Acids (FISH OIL PO) Take by mouth.        . Vitamin D, Ergocalciferol, (DRISDOL) 50000 UNITS CAPS TAKE 1 CAPSULE BY MOUTH EVERY 14 DAYS.  6 capsule  0    Allergies  Allergen Reactions  . Phenytoin Sodium Extended  Swelling and Rash    Past Medical History  Diagnosis Date  . GERD (gastroesophageal reflux disease)   . Osteoporosis   . Seizure disorder   . Vitamin D deficiency   . Fracture of metatarsal     Repair of fractured R metatarsal --Dr Duda---4/08  . Hypertension   . Seizures     Past Surgical History  Procedure Date  . Tonsillectomy and adenoidectomy 1948  . Foot surgery 2008  . Eye surgery     Obstructed tear duct  . Tear duct probing 10/12    temporary stent    Family History  Problem Relation Age of Onset  . Hypertension Mother   . Cancer Daughter     breast cancer  . Breast cancer Daughter   . Diabetes Maternal Aunt   . Coronary artery disease Paternal Aunt   . Breast cancer Paternal Aunt   . Coronary artery disease Paternal Uncle   . Heart disease Maternal Grandmother   . Heart disease Maternal Grandfather   . Heart disease Paternal Grandmother   . Heart disease Paternal Grandfather   . Heart disease Father   . Colon cancer Neg Hx   . Stomach  cancer Neg Hx     History   Social History  . Marital Status: Married    Spouse Name: N/A    Number of Children: 3  . Years of Education: N/A   Occupational History  . retired Geologist, engineering    Social History Main Topics  . Smoking status: Never Smoker   . Smokeless tobacco: Never Used  . Alcohol Use: Yes     Comment: occasional  . Drug Use: No  . Sexually Active: Yes    Birth Control/ Protection: Post-menopausal   Other Topics Concern  . Not on file   Social History Narrative   Regular exercise-yes---aerobics, Pilates, jogged in past (now walks)Has living willHusband, then one of her children, has health care POAWould accept resuscitation but no prolonged artificial ventilationProbably would not want tube feeds if cognitively unaware   Review of Systems No fever No sore throat    Objective:   Physical Exam  Constitutional: She appears well-developed and well-nourished. No distress.  HENT:   Mouth/Throat: Oropharynx is clear and moist. No oropharyngeal exudate.  Neck: No thyromegaly present.       Restriction of flexion and extension---mild but very stiff and takes a while to get reasonable flexion Rotation and tilt are more limited Some stiffness bilaterally in muscles  Lymphadenopathy:    She has no cervical adenopathy.  Neurological:       Normal strength in arms  Psychiatric: She has a normal mood and affect. Her behavior is normal.          Assessment & Plan:

## 2012-06-17 NOTE — Assessment & Plan Note (Signed)
Seems to be muscular Will have her restart the naproxen PT evaluation

## 2012-06-17 NOTE — Assessment & Plan Note (Signed)
Dr Valarie Cones has recommended boniva now Will plan for 5 years

## 2012-06-17 NOTE — Assessment & Plan Note (Signed)
Had sensation that she would have another seizure last month Increased the depakote Will recheck the level

## 2012-06-18 LAB — VALPROIC ACID LEVEL: Valproic Acid Lvl: 70.9 ug/mL (ref 50.0–100.0)

## 2012-07-09 ENCOUNTER — Telehealth: Payer: Self-pay | Admitting: Internal Medicine

## 2012-07-09 MED ORDER — VITAMIN D (ERGOCALCIFEROL) 1.25 MG (50000 UNIT) PO CAPS: 50000.0000 [IU] | ORAL_CAPSULE | ORAL | Status: DC

## 2012-07-09 NOTE — Telephone Encounter (Signed)
Pt needs a new RX written because she is using a new mail order pharmacy.  The medication she needs the rx for is Vitamin D 2 1.25 mg.  She asks that we send this new RX through e-prescribe to [facility]

## 2012-08-09 ENCOUNTER — Ambulatory Visit (INDEPENDENT_AMBULATORY_CARE_PROVIDER_SITE_OTHER): Payer: Medicare Other | Admitting: Family Medicine

## 2012-08-09 ENCOUNTER — Telehealth: Payer: Self-pay

## 2012-08-09 ENCOUNTER — Encounter: Payer: Self-pay | Admitting: Family Medicine

## 2012-08-09 VITALS — BP 166/87 | HR 51 | Temp 97.6°F | Ht 61.0 in | Wt 114.0 lb

## 2012-08-09 DIAGNOSIS — N390 Urinary tract infection, site not specified: Secondary | ICD-10-CM

## 2012-08-09 DIAGNOSIS — R300 Dysuria: Secondary | ICD-10-CM

## 2012-08-09 DIAGNOSIS — IMO0002 Reserved for concepts with insufficient information to code with codable children: Secondary | ICD-10-CM

## 2012-08-09 DIAGNOSIS — R55 Syncope and collapse: Secondary | ICD-10-CM

## 2012-08-09 LAB — POCT URINALYSIS DIPSTICK
Bilirubin, UA: NEGATIVE
Ketones, UA: NEGATIVE
Nitrite, UA: NEGATIVE
Protein, UA: NEGATIVE

## 2012-08-09 MED ORDER — SULFAMETHOXAZOLE-TMP DS 800-160 MG PO TABS: 1.0000 | ORAL_TABLET | Freq: Two times a day (BID) | ORAL | Status: DC

## 2012-08-09 NOTE — Progress Notes (Signed)
Nature conservation officer at [facility] [address] [place] [address] Phone: [phone] Fax: [phone]  Date:  08/09/2012   Name:  Tanya Knight   DOB:  1940/11/25   MRN:  [id number] Gender: female Age: 72 y.o.  Primary Physician:  Tillman Abide, MD  Evaluating MD: Hannah Beat, MD   Chief Complaint: Loss of Consciousness and Urinary Tract Infection   History of Present Illness:  Tanya Knight is a 72 y.o. pleasant patient who presents with the following:  In December, her head did not really feel right, and she saw Dr. Alphonsus Sias, and something was not right, and she could feel something wrong, but never passed out or had any seizures. Does have a history of seizure disorder, but has not had one since 1997 and is maintained on Depakote. [facility] and she had the same feeling in her chair. Fully lost consciousness, and hit the floor and came to. Husband was asleep, then bruises started to come out. Likely 3-4 per her. No other neurological symptoms: no weakness, headache, blurred vision, facial drooping, one sided weakness, gait disturbance, or disturbance of though.  No palpitations. No tachycardia. Occaisionally will skip a beat or 2, but never more than a few seconds. No history of cardiac issues or CVA.  + UTI on OTC urine strips.  Mother could have UTI with minimal symptoms.   Did feel a flutter, but not skipping beats routinely.   Patient Active Problem List  Diagnosis  . HYPERTENSION  . GERD  . OSTEOPOROSIS  . SEIZURE DISORDER  . Routine general medical examination at a health care facility  . Neck pain    Past Medical History  Diagnosis Date  . GERD (gastroesophageal reflux disease)   . Osteoporosis   . Seizure disorder   . Vitamin D deficiency   . Fracture of metatarsal     Repair of fractured R metatarsal --Dr Duda---4/08  . Hypertension   . Seizures     Past Surgical History  Procedure Laterality Date  .  Tonsillectomy and adenoidectomy  1948  . Foot surgery  2008  . Eye surgery      Obstructed tear duct  . Tear duct probing  10/12    temporary stent    History   Social History  . Marital Status: Married    Spouse Name: N/A    Number of Children: 3  . Years of Education: N/A   Occupational History  . retired Geologist, engineering    Social History Main Topics  . Smoking status: Never Smoker   . Smokeless tobacco: Never Used  . Alcohol Use: Yes     Comment: occasional  . Drug Use: No  . Sexually Active: Yes    Birth Control/ Protection: Post-menopausal   Other Topics Concern  . Not on file   Social History Narrative   Regular exercise-yes---aerobics, Pilates, jogged in past (now walks)      Has living will   Husband, then one of her children, has health care POA   Would accept resuscitation but no prolonged artificial ventilation   Probably would not want tube feeds if cognitively unaware    Family History  Problem Relation Age of Onset  . Hypertension Mother   . Cancer Daughter     breast cancer  . Breast cancer Daughter   . Diabetes Maternal Aunt   . Coronary artery disease Paternal Aunt   . Breast cancer Paternal Aunt   .  Coronary artery disease Paternal Uncle   . Heart disease Maternal Grandmother   . Heart disease Maternal Grandfather   . Heart disease Paternal Grandmother   . Heart disease Paternal Grandfather   . Heart disease Father   . Colon cancer Neg Hx   . Stomach cancer Neg Hx     Allergies  Allergen Reactions  . Phenytoin Sodium Extended Swelling and Rash    Medication list has been reviewed and updated.  Outpatient Prescriptions Prior to Visit  Medication Sig Dispense Refill  . amLODipine (NORVASC) 5 MG tablet TAKE 1 TABLET DAILY  90 tablet  3  . aspirin 81 MG tablet Take 81 mg by mouth daily.        Marland Kitchen CALCIUM PO Take by mouth.        . divalproex (DEPAKOTE) 250 MG DR tablet Take 2 tablets (500 mg total) by mouth 2 (two) times daily.   360 tablet  3  . estradiol (ESTRING) 2 MG vaginal ring Place 2 mg vaginally every 3 (three) months. follow package directions  1 each  4  . ibandronate (BONIVA) 150 MG tablet Take in the morning with a full glass of water, on an empty stomach, and do not take anything else by mouth or lie down for the next 30 min.  3 tablet  3  . losartan (COZAAR) 100 MG tablet TAKE 1 TABLET DAILY  90 tablet  3  . metoprolol succinate (TOPROL-XL) 100 MG 24 hr tablet Take 1 tablet (100 mg total) by mouth daily. Take with or immediately following a meal.  90 tablet  3  . Multiple Vitamin (MULTIVITAMIN) capsule Take 1 capsule by mouth daily.        . Multiple Vitamins-Minerals (OCUVITE PO) Take by mouth.        . Omega-3 Fatty Acids (FISH OIL PO) Take by mouth.        . Vitamin D, Ergocalciferol, (DRISDOL) 50000 UNITS CAPS Take 1 capsule (50,000 Units total) by mouth every 14 (fourteen) days.  6 capsule  3   No facility-administered medications prior to visit.    Review of Systems:  As above. No fever, chills, sweats. No chest pain or shortness of breath.  Physical Examination: BP 120/62  Pulse 54  Temp(Src) 97.6 F (36.4 C) (Oral)  Ht 5\' 1"  (1.549 m)  Wt 114 lb (51.71 kg)  BMI 21.55 kg/m2  SpO2 98%  Ideal Body Weight: Weight in (lb) to have BMI = 25: 132   GEN: WDWN, NAD, Non-toxic, A & O x 3 HEENT: Atraumatic, Normocephalic. Neck supple. No masses, No LAD. Ears and Nose: No external deformity. CV: RRR, No M/G/R. No JVD. No thrill. No extra heart sounds. PULM: CTA B, no wheezes, crackles, rhonchi. No retractions. No resp. distress. No accessory muscle use. EXTR: No c/c/e NEURO Normal gait.  PSYCH: Normally interactive. Conversant. Not depressed or anxious appearing.  Calm demeanor.    Assessment and Plan:  Syncope  Dysuria - Plan: POCT Urinalysis Dipstick, Urine culture  Passed out - Plan: EKG 12-Lead, EKG 12-Lead  UTI (urinary tract infection)  Probable UTI, check cx and start on  ABX  Syncope may have been related to UTI and hypotension or orthostasis. Does not appear to have been a neurological event or cardiac. EKG is reassuring.   Discussed with the patient, who has a follow-up with Dr. Alphonsus Sias in a few weeks. I think for now reasonable to treat UTI, but if return despite this, then further eval  indicated.  EKG: Normal sinus rhythm. Normal axis, normal R wave progression, No acute ST elevation or depression.   Results for orders placed in visit on 08/09/12  POCT URINALYSIS DIPSTICK      Result Value Range   Color, UA yellow     Clarity, UA clear     Glucose, UA neg     Bilirubin, UA neg     Ketones, UA neg     Spec Grav, UA 1.025     Blood, UA small     pH, UA 6.0     Protein, UA neg     Urobilinogen, UA negative     Nitrite, UA neg     Leukocytes, UA moderate (2+)       Orders Today:  Orders Placed This Encounter  Procedures  . Urine culture  . POCT Urinalysis Dipstick  . EKG 12-Lead    Standing Status: Standing     Number of Occurrences: 1     Standing Expiration Date:     Order Specific Question:  Reason for Exam    Answer:  passed out    Updated Medication List: (Includes new medications, updates to list, dose adjustments) Meds ordered this encounter  Medications  . sulfamethoxazole-trimethoprim (BACTRIM DS) 800-160 MG per tablet    Sig: Take 1 tablet by mouth 2 (two) times daily.    Dispense:  14 tablet    Refill:  0    Medications Discontinued: There are no discontinued medications.    Signed, Elpidio Galea. Nishita Isaacks, MD 08/09/2012 11:33 AM

## 2012-08-09 NOTE — Telephone Encounter (Signed)
Pt said early AM on 08/07/12 pt had "funny feeling in head" and fainted for short period of time. Pt checked OTC test for UTI and was positive. Pt started cranberry juice over weekend but wants to be seen today; hx of seizures but pt said she did not have seizure on Sat.  Pt does not have back pain, burning or pain upon urination or frequency. Dr Alphonsus Sias does not have available appt today and scheduled to see Dr Patsy Lager today at  10:45 am.

## 2012-08-10 LAB — URINE CULTURE: Colony Count: NO GROWTH

## 2012-08-11 ENCOUNTER — Ambulatory Visit: Payer: Medicare Other | Admitting: Family Medicine

## 2012-09-03 ENCOUNTER — Encounter: Payer: Self-pay | Admitting: Internal Medicine

## 2012-09-03 ENCOUNTER — Ambulatory Visit (INDEPENDENT_AMBULATORY_CARE_PROVIDER_SITE_OTHER): Payer: Medicare Other | Admitting: Internal Medicine

## 2012-09-03 VITALS — BP 130/68 | HR 54 | Temp 97.6°F | Wt 112.0 lb

## 2012-09-03 DIAGNOSIS — I10 Essential (primary) hypertension: Secondary | ICD-10-CM

## 2012-09-03 DIAGNOSIS — R569 Unspecified convulsions: Secondary | ICD-10-CM

## 2012-09-03 DIAGNOSIS — M810 Age-related osteoporosis without current pathological fracture: Secondary | ICD-10-CM

## 2012-09-03 DIAGNOSIS — Z0000 Encounter for general adult medical examination without abnormal findings: Secondary | ICD-10-CM

## 2012-09-03 LAB — BASIC METABOLIC PANEL
BUN: 13 mg/dL (ref 6–23)
GFR: 120.67 mL/min (ref >60.00)
Glucose, Bld: 81 mg/dL (ref 70–99)
Potassium: 4.2 mEq/L (ref 3.5–5.1)

## 2012-09-03 LAB — CBC WITH DIFFERENTIAL/PLATELET
Basophils Absolute: 0.1 10*3/uL (ref 0.0–0.1)
Eosinophils Absolute: 0.6 10*3/uL (ref 0.0–0.7)
HCT: 40.5 % (ref 36.0–46.0)
Lymphs Abs: 1.5 10*3/uL (ref 0.7–4.0)
MCHC: 33.5 g/dL (ref 30.0–36.0)
MCV: 93.7 fl (ref 78.0–100.0)
Monocytes Absolute: 0.6 10*3/uL (ref 0.1–1.0)
Platelets: 240 10*3/uL (ref 150.0–400.0)
RDW: 14.9 % — ABNORMAL HIGH (ref 11.5–14.6)

## 2012-09-03 LAB — HEPATIC FUNCTION PANEL: Albumin: 4.3 g/dL (ref 3.5–5.2)

## 2012-09-03 LAB — TSH: TSH: 1.52 u[IU]/mL (ref 0.35–5.50)

## 2012-09-03 MED ORDER — AMLODIPINE BESYLATE 5 MG PO TABS: 5.0000 mg | ORAL_TABLET | Freq: Every day | ORAL | Status: DC

## 2012-09-03 MED ORDER — ESTRADIOL 2 MG VA RING: 2.0000 mg | VAGINAL_RING | VAGINAL | Status: DC

## 2012-09-03 MED ORDER — LOSARTAN POTASSIUM 100 MG PO TABS: 100.0000 mg | ORAL_TABLET | Freq: Every day | ORAL | Status: DC

## 2012-09-03 NOTE — Patient Instructions (Addendum)
Please cut the amlodipine in half and only take 2.5mg  daily. Please let me know if your blood pressure is consistently over 150-160/90

## 2012-09-03 NOTE — Progress Notes (Signed)
Subjective:    Patient ID: Tanya Knight, female    DOB: [birth year], 72 y.o.   MRN: [id number]  HPI Here for physical Reviewed last incident Had premonitory feeling first thing in AM (and was heading to chair) and then fainted  Basically awake almost immediately after hitting floor No tongue bite, no incontinence  [facility] Good stamina and exercise tolerance  No longer seeing Dr Dianne Dun or Dr Valarie Cones Back on boniva  Fosamax in past---off for probably 5 years then  Current Outpatient Prescriptions on File Prior to Visit  Medication Sig Dispense Refill  . aspirin 81 MG tablet Take 81 mg by mouth daily.        Marland Kitchen CALCIUM PO Take by mouth.        . divalproex (DEPAKOTE) 250 MG DR tablet Take 2 tablets (500 mg total) by mouth 2 (two) times daily.  360 tablet  3  . ibandronate (BONIVA) 150 MG tablet Take in the morning with a full glass of water, on an empty stomach, and do not take anything else by mouth or lie down for the next 30 min.  3 tablet  3  . metoprolol succinate (TOPROL-XL) 100 MG 24 hr tablet Take 1 tablet (100 mg total) by mouth daily. Take with or immediately following a meal.  90 tablet  3  . Multiple Vitamin (MULTIVITAMIN) capsule Take 1 capsule by mouth daily.        . Multiple Vitamins-Minerals (OCUVITE PO) Take by mouth.        . Omega-3 Fatty Acids (FISH OIL PO) Take by mouth.        . Vitamin D, Ergocalciferol, (DRISDOL) 50000 UNITS CAPS Take 1 capsule (50,000 Units total) by mouth every 14 (fourteen) days.  6 capsule  3   No current facility-administered medications on file prior to visit.    Allergies  Allergen Reactions  . Phenytoin Sodium Extended Swelling and Rash    Past Medical History  Diagnosis Date  . GERD (gastroesophageal reflux disease)   . Osteoporosis   . Seizure disorder   . Vitamin D deficiency   . Fracture of metatarsal     Repair of fractured R metatarsal --Dr Duda---4/08  . Hypertension   . Seizures     Past  Surgical History  Procedure Laterality Date  . Tonsillectomy and adenoidectomy  1948  . Foot surgery  2008  . Eye surgery      Obstructed tear duct  . Tear duct probing  10/12    temporary stent    Family History  Problem Relation Age of Onset  . Hypertension Mother   . Cancer Daughter     breast cancer  . Breast cancer Daughter   . Diabetes Maternal Aunt   . Coronary artery disease Paternal Aunt   . Breast cancer Paternal Aunt   . Coronary artery disease Paternal Uncle   . Heart disease Maternal Grandmother   . Heart disease Maternal Grandfather   . Heart disease Paternal Grandmother   . Heart disease Paternal Grandfather   . Heart disease Father   . Colon cancer Neg Hx   . Stomach cancer Neg Hx     History   Social History  . Marital Status: Married    Spouse Name: N/A    Number of Children: 3  . Years of Education: N/A   Occupational History  . retired Geologist, engineering    Social History Main Topics  . Smoking status: Never Smoker   .  Smokeless tobacco: Never Used  . Alcohol Use: Yes     Comment: occasional  . Drug Use: No  . Sexually Active: Yes    Birth Control/ Protection: Post-menopausal   Other Topics Concern  . Not on file   Social History Narrative   Regular exercise-yes---aerobics, Pilates, jogged in past (now walks)      Has living will   Husband, then one of her children, has health care POA   Would accept resuscitation but no prolonged artificial ventilation   Probably would not want tube feeds if cognitively unaware   Review of Systems  Constitutional: Negative for fatigue and unexpected weight change.       Wears seat belt  HENT: Negative for hearing loss, congestion, rhinorrhea, dental problem and tinnitus.        Regular with dentist  Eyes: Negative for visual disturbance.       No diplopia or unilateral vision loss Due for cataract surgery OU coming up  Respiratory: Negative for cough, chest tightness and shortness of breath.    Cardiovascular: Positive for palpitations. Negative for chest pain and leg swelling.       Rare fluttering in heart--only at rest  Gastrointestinal: Negative for nausea, vomiting, abdominal pain, constipation and blood in stool.       Occ heartburn if dietary indiscretion--rare tums  Endocrine: Negative for cold intolerance and heat intolerance.  Genitourinary: Negative for dysuria and dyspareunia.       Estring does help vaginal dryness No incontinence  Musculoskeletal: Positive for back pain. Negative for joint swelling and arthralgias.       Back is her weak spot---works on core with exercise  Skin: Negative for rash.       Sees derm regularly  Allergic/Immunologic: Negative for environmental allergies and immunocompromised state.  Neurological: Positive for syncope. Negative for weakness and numbness.  Hematological: Negative for adenopathy. Does not bruise/bleed easily.  Psychiatric/Behavioral: Negative for sleep disturbance and dysphoric mood. The patient is not nervous/anxious.        Objective:   Physical Exam  Constitutional: She is oriented to person, place, and time. She appears well-developed and well-nourished. No distress.  HENT:  Head: Normocephalic and atraumatic.  Right Ear: External ear normal.  Left Ear: External ear normal.  Mouth/Throat: Oropharynx is clear and moist. No oropharyngeal exudate.  Eyes: Conjunctivae and EOM are normal. Pupils are equal, round, and reactive to light.  Neck: Normal range of motion. Neck supple. No thyromegaly present.  Cardiovascular: Normal rate, regular rhythm, normal heart sounds and intact distal pulses.  Exam reveals no gallop.   No murmur heard. Pulmonary/Chest: Effort normal and breath sounds normal. No respiratory distress. She has no wheezes. She has no rales.  Abdominal: Soft. There is no tenderness.  Genitourinary:  No breast masses or tenderness  Musculoskeletal: She exhibits no edema and no tenderness.   Lymphadenopathy:    She has no cervical adenopathy.    She has no axillary adenopathy.  Neurological: She is alert and oriented to person, place, and time.  Skin: No rash noted. No erythema.  Psychiatric: She has a normal mood and affect. Her behavior is normal.          Assessment & Plan:

## 2012-09-03 NOTE — Assessment & Plan Note (Signed)
Will continue the ibandronate till 2018

## 2012-09-03 NOTE — Assessment & Plan Note (Signed)
Fortunately, recent spell doesn't seem to be a seizure Will check her levels

## 2012-09-03 NOTE — Assessment & Plan Note (Signed)
Healthy UTD with imms and cancer screening

## 2012-09-03 NOTE — Assessment & Plan Note (Signed)
BP Readings from Last 3 Encounters:  09/03/12 130/68  08/09/12 166/87  06/17/12 160/90   Has had some orthostasis Will cut the amlodipine in half

## 2012-09-06 ENCOUNTER — Encounter: Payer: Self-pay | Admitting: *Deleted

## 2012-09-08 ENCOUNTER — Ambulatory Visit: Payer: Self-pay | Admitting: Ophthalmology

## 2012-10-06 ENCOUNTER — Ambulatory Visit: Payer: Self-pay | Admitting: Ophthalmology

## 2012-12-14 ENCOUNTER — Other Ambulatory Visit: Payer: Self-pay | Admitting: Internal Medicine

## 2013-02-22 ENCOUNTER — Other Ambulatory Visit: Payer: Self-pay | Admitting: Internal Medicine

## 2013-02-28 ENCOUNTER — Other Ambulatory Visit: Payer: Self-pay | Admitting: Internal Medicine

## 2013-04-05 ENCOUNTER — Encounter: Payer: Self-pay | Admitting: Internal Medicine

## 2013-04-11 ENCOUNTER — Encounter: Payer: Self-pay | Admitting: *Deleted

## 2013-04-28 ENCOUNTER — Other Ambulatory Visit: Payer: Self-pay | Admitting: Internal Medicine

## 2013-06-07 ENCOUNTER — Encounter: Payer: Self-pay | Admitting: Gastroenterology

## 2013-07-14 ENCOUNTER — Encounter: Payer: Self-pay | Admitting: Gastroenterology

## 2013-09-01 ENCOUNTER — Ambulatory Visit (AMBULATORY_SURGERY_CENTER): Payer: Medicare Other | Admitting: *Deleted

## 2013-09-01 VITALS — Ht 61.0 in | Wt 119.0 lb

## 2013-09-01 DIAGNOSIS — Z860100 Personal history of colon polyps, unspecified: Secondary | ICD-10-CM

## 2013-09-01 MED ORDER — MOVIPREP 100 G PO SOLR: 1.0000 | Freq: Once | ORAL | Status: DC

## 2013-09-01 NOTE — Progress Notes (Signed)
No allergies to eggs or soy. No home oxygen. No diet/weight loss pills. No email.

## 2013-09-06 ENCOUNTER — Encounter: Payer: Self-pay | Admitting: Internal Medicine

## 2013-09-06 ENCOUNTER — Other Ambulatory Visit: Payer: Self-pay | Admitting: Family Medicine

## 2013-09-06 ENCOUNTER — Ambulatory Visit (INDEPENDENT_AMBULATORY_CARE_PROVIDER_SITE_OTHER): Payer: Medicare Other | Admitting: Internal Medicine

## 2013-09-06 VITALS — BP 148/82 | HR 46 | Temp 97.5°F | Ht 61.5 in | Wt 117.0 lb

## 2013-09-06 DIAGNOSIS — M810 Age-related osteoporosis without current pathological fracture: Secondary | ICD-10-CM

## 2013-09-06 DIAGNOSIS — I10 Essential (primary) hypertension: Secondary | ICD-10-CM

## 2013-09-06 DIAGNOSIS — R569 Unspecified convulsions: Secondary | ICD-10-CM

## 2013-09-06 DIAGNOSIS — Z0000 Encounter for general adult medical examination without abnormal findings: Secondary | ICD-10-CM

## 2013-09-06 DIAGNOSIS — Z23 Encounter for immunization: Secondary | ICD-10-CM

## 2013-09-06 LAB — CBC WITH DIFFERENTIAL/PLATELET
BASOS PCT: 0.6 % (ref 0.0–3.0)
Basophils Absolute: 0 10*3/uL (ref 0.0–0.1)
EOS ABS: 0.2 10*3/uL (ref 0.0–0.7)
Eosinophils Relative: 2.9 % (ref 0.0–5.0)
HCT: 39 % (ref 36.0–46.0)
Hemoglobin: 12.8 g/dL (ref 12.0–15.0)
Lymphocytes Relative: 15.3 % (ref 12.0–46.0)
Lymphs Abs: 1.2 10*3/uL (ref 0.7–4.0)
MCHC: 32.8 g/dL (ref 30.0–36.0)
MCV: 88.9 fl (ref 78.0–100.0)
MONO ABS: 0.6 10*3/uL (ref 0.1–1.0)
Monocytes Relative: 8.2 % (ref 3.0–12.0)
NEUTROS PCT: 73 % (ref 43.0–77.0)
Neutro Abs: 5.6 10*3/uL (ref 1.4–7.7)
Platelets: 228 10*3/uL (ref 150.0–400.0)
RBC: 4.39 Mil/uL (ref 3.87–5.11)
RDW: 15.4 % — AB (ref 11.5–14.6)
WBC: 7.6 10*3/uL (ref 4.5–10.5)

## 2013-09-06 LAB — COMPREHENSIVE METABOLIC PANEL
ALBUMIN: 4.4 g/dL (ref 3.5–5.2)
ALT: 18 U/L (ref 0–35)
AST: 32 U/L (ref 0–37)
Alkaline Phosphatase: 49 U/L (ref 39–117)
BUN: 14 mg/dL (ref 6–23)
CO2: 25 mEq/L (ref 19–32)
Calcium: 9.8 mg/dL (ref 8.4–10.5)
Chloride: 92 mEq/L — ABNORMAL LOW (ref 96–112)
Creatinine, Ser: 0.7 mg/dL (ref 0.4–1.2)
GFR: 83.16 mL/min (ref >60.00)
Glucose, Bld: 84 mg/dL (ref 70–99)
POTASSIUM: 5.2 meq/L — AB (ref 3.5–5.1)
SODIUM: 128 meq/L — AB (ref 135–145)
TOTAL PROTEIN: 6.8 g/dL (ref 6.0–8.3)
Total Bilirubin: 0.5 mg/dL (ref 0.3–1.2)

## 2013-09-06 LAB — TSH: TSH: 1.31 u[IU]/mL (ref 0.35–5.50)

## 2013-09-06 LAB — T4, FREE: Free T4: 0.89 ng/dL (ref 0.60–1.60)

## 2013-09-06 MED ORDER — METOPROLOL SUCCINATE ER 100 MG PO TB24: ORAL_TABLET | ORAL | Status: DC

## 2013-09-06 MED ORDER — DIVALPROEX SODIUM 500 MG PO DR TAB: 500.0000 mg | DELAYED_RELEASE_TABLET | Freq: Two times a day (BID) | ORAL | Status: DC

## 2013-09-06 MED ORDER — LOSARTAN POTASSIUM 100 MG PO TABS: 100.0000 mg | ORAL_TABLET | Freq: Every day | ORAL | Status: DC

## 2013-09-06 MED ORDER — VITAMIN D (ERGOCALCIFEROL) 1.25 MG (50000 UNIT) PO CAPS: ORAL_CAPSULE | ORAL | Status: DC

## 2013-09-06 MED ORDER — IBANDRONATE SODIUM 150 MG PO TABS: ORAL_TABLET | ORAL | Status: DC

## 2013-09-06 MED ORDER — AMLODIPINE BESYLATE 2.5 MG PO TABS: 2.5000 mg | ORAL_TABLET | Freq: Every day | ORAL | Status: DC

## 2013-09-06 MED ORDER — ESTRADIOL 2 MG VA RING: 2.0000 mg | VAGINAL_RING | VAGINAL | Status: DC

## 2013-09-06 NOTE — Assessment & Plan Note (Signed)
None on the med Will check labs

## 2013-09-06 NOTE — Addendum Note (Signed)
Addended by: Ander Gaster B on: 09/06/2013 10:42 AM   Modules accepted: Orders

## 2013-09-06 NOTE — Progress Notes (Signed)
Pre visit review using our clinic review tool, if applicable. No additional management support is needed unless otherwise documented below in the visit note. 

## 2013-09-06 NOTE — Assessment & Plan Note (Signed)
I have personally reviewed the Medicare Annual Wellness questionnaire and have noted 1. The patient's medical and social history 2. Their use of alcohol, tobacco or illicit drugs 3. Their current medications and supplements 4. The patient's functional ability including ADL's, fall risks, home safety risks and hearing or visual             impairment. 5. Diet and physical activities 6. Evidence for depression or mood disorders  The patients weight, height, BMI and visual acuity have been recorded in the chart I have made referrals, counseling and provided education to the patient based review of the above and I have provided the pt with a written personalized care plan for preventive services.  I have provided you with a copy of your personalized plan for preventive services. Please take the time to review along with your updated medication list.  UTD on imms--will give prevnar Colonoscopy in 3 days mammo every 2 years

## 2013-09-06 NOTE — Assessment & Plan Note (Signed)
Continue ibandronate till 2018

## 2013-09-06 NOTE — Progress Notes (Signed)
Subjective:    Patient ID: Tanya Knight, female    DOB: Sep 29, 1940, 73 y.o.   MRN: [id number]  HPI Here for Medicare wellness and physical Reviewed form Doing well overall Sees eye doctor and dentist Regular exercise Occasional alcohol. No tobacco. Independent with instrumental ADLs Mild hearing problems. Vision is fine. Some itching in ears. No other allergy symptoms No cognitive problems  No seizures No problems with the med  No syncope Does monitor BP Had one morning she felt bad--- BP was 92/67. Recovered without a problem Usually runs 128/82-150/84. Most systolics under 979 at home Heart rate averages in the high 40's at home No problems during aggressive exercise  Current Outpatient Prescriptions on File Prior to Visit  Medication Sig Dispense Refill  . amLODipine (NORVASC) 5 MG tablet Take 2.5 mg by mouth daily.      Marland Kitchen aspirin 81 MG tablet Take 81 mg by mouth daily.        Marland Kitchen CALCIUM PO Take by mouth.        . divalproex (DEPAKOTE) 250 MG DR tablet TAKE 2 TABLETS BY MOUTH TWICE DAILY  360 tablet  0  . estradiol (ESTRING) 2 MG vaginal ring Place 2 mg vaginally every 3 (three) months. follow package directions  1 each  4  . ibandronate (BONIVA) 150 MG tablet TAKE 1 TABLET BY MOUTH EVERY MONTH IN THE MORNING WITH WATER. DO NOT LIE DOWN OR EAT FOR 30 MINUTES AFTER  3 tablet  0  . losartan (COZAAR) 100 MG tablet Take 1 tablet (100 mg total) by mouth daily.  90 tablet  3  . metoprolol succinate (TOPROL-XL) 100 MG 24 hr tablet TAKE 1 TABLET BY MOUTH EVERY DAY  90 tablet  0  . MOVIPREP 100 G SOLR Take 1 kit (200 g total) by mouth once.  1 kit  0  . Multiple Vitamin (MULTIVITAMIN) capsule Take 1 capsule by mouth daily.        . Multiple Vitamins-Minerals (OCUVITE PO) Take by mouth.        . Omega-3 Fatty Acids (FISH OIL PO) Take by mouth.        . Vitamin D, Ergocalciferol, (DRISDOL) 50000 UNITS CAPS capsule TAKE 1 CAPSULE BY MOUTH EVERY 14 DAYS  10 capsule  0   No current  facility-administered medications on file prior to visit.    Allergies  Allergen Reactions  . Phenytoin Sodium Extended Swelling and Rash    Past Medical History  Diagnosis Date  . GERD (gastroesophageal reflux disease)   . Osteoporosis   . Seizure disorder   . Vitamin D deficiency   . Fracture of metatarsal     Repair of fractured R metatarsal --Dr Duda---4/08  . Hypertension   . Seizures     Past Surgical History  Procedure Laterality Date  . Tonsillectomy and adenoidectomy  1948  . Foot surgery  2008  . Eye surgery      Obstructed tear duct  . Tear duct probing  10/12    temporary stent  . Cataract extraction, bilateral Bilateral 2014    Family History  Problem Relation Age of Onset  . Hypertension Mother   . Breast cancer Daughter   . Diabetes Maternal Aunt   . Coronary artery disease Paternal Aunt   . Breast cancer Paternal Aunt   . Coronary artery disease Paternal Uncle   . Heart disease Maternal Grandmother   . Heart disease Maternal Grandfather   . Heart disease Paternal Grandmother   .  Heart disease Paternal Grandfather   . Heart disease Father   . Colon cancer Neg Hx   . Stomach cancer Neg Hx   . Pancreatic cancer Neg Hx   . Rectal cancer Neg Hx     History   Social History  . Marital Status: Married    Spouse Name: N/A    Number of Children: 3  . Years of Education: N/A   Occupational History  . retired Control and instrumentation engineer    Social History Main Topics  . Smoking status: Never Smoker   . Smokeless tobacco: Never Used  . Alcohol Use: Yes     Comment: occasional  . Drug Use: No  . Sexual Activity: Yes    Birth Control/ Protection: Post-menopausal   Other Topics Concern  . Not on file   Social History Narrative   Regular exercise-yes---aerobics, Pilates, jogged in past (now walks)      Has living will   Husband, then one of her children, has health care POA   Would accept resuscitation but no prolonged artificial ventilation    Probably would not want tube feeds if cognitively unaware   Review of Systems  Constitutional: Negative for fatigue and unexpected weight change.       Wears seat belt  HENT: Positive for hearing loss. Negative for dental problem and tinnitus.        Regular with dentist  Eyes: Negative for visual disturbance.       No diplopia or unilateral vision loss  Respiratory: Negative for cough, chest tightness and shortness of breath.   Cardiovascular: Negative for chest pain, palpitations and leg swelling.  Gastrointestinal: Positive for constipation. Negative for nausea, vomiting, abdominal pain and blood in stool.       Having follow up colonoscopy on Friday  Endocrine: Positive for heat intolerance. Negative for cold intolerance.       Affected by sun  Genitourinary: Negative for dysuria, hematuria, difficulty urinating and dyspareunia.       Estring still helps vaginal dryness and uses lubricant  Musculoskeletal: Positive for arthralgias. Negative for back pain and joint swelling.       Ongoing neck pain--was helped by [facility]  Skin: Negative for rash.       No suspicious lesions Regular with derm--yearly  Allergic/Immunologic: Negative for environmental allergies and immunocompromised state.  Neurological: Positive for numbness. Negative for dizziness, syncope, weakness, light-headedness and headaches.       Numbness and tingling in right arm and leg with some yoga positions  Hematological: Negative for adenopathy. Does not bruise/bleed easily.  Psychiatric/Behavioral: Negative for sleep disturbance and dysphoric mood. The patient is not nervous/anxious.        Objective:   Physical Exam  Constitutional: She is oriented to person, place, and time. She appears well-developed and well-nourished. No distress.  HENT:  Head: Normocephalic and atraumatic.  Right Ear: External ear normal.  Left Ear: External ear normal.  Mouth/Throat: Oropharynx is clear and moist. No oropharyngeal  exudate.  Eyes: Conjunctivae and EOM are normal. Pupils are equal, round, and reactive to light.  Neck: Normal range of motion. Neck supple. No thyromegaly present.  Cardiovascular: Regular rhythm, normal heart sounds and intact distal pulses.  Exam reveals no gallop.   No murmur heard. slow  Pulmonary/Chest: Effort normal and breath sounds normal. No respiratory distress. She has no wheezes. She has no rales.  Abdominal: Soft. She exhibits no distension. There is no tenderness. There is no rebound and no guarding.  Genitourinary:  No breast masses or tenderness  Musculoskeletal: Normal range of motion. She exhibits no edema.  Lymphadenopathy:    She has no cervical adenopathy.    She has no axillary adenopathy.  Neurological: She is alert and oriented to person, place, and time.  President-- "Obama, Clinton (then) Bush (daddy and son) [phone] (some difficulty) [id number] Recall 2/3  Skin: No rash noted. No erythema.  Psychiatric: She has a normal mood and affect. Her behavior is normal.          Assessment & Plan:

## 2013-09-06 NOTE — Patient Instructions (Signed)
Please decrease the metoprolol to 1/2 tab daily (50mg ). Let me know if your blood pressure is consistently over 150-160/100.

## 2013-09-06 NOTE — Assessment & Plan Note (Signed)
BP Readings from Last 3 Encounters:  09/06/13 148/82  09/03/12 130/68  08/09/12 166/87   Reasonable control Bradycardic---will cut the metoprolol If BP goes up, will increase the amlodipine again

## 2013-09-07 ENCOUNTER — Encounter: Payer: Self-pay | Admitting: Family Medicine

## 2013-09-07 ENCOUNTER — Encounter: Payer: Self-pay | Admitting: Gastroenterology

## 2013-09-08 LAB — VALPROIC ACID LEVEL: VALPROIC ACID LVL: 116.3 ug/mL — AB (ref 50.0–100.0)

## 2013-09-09 ENCOUNTER — Ambulatory Visit (AMBULATORY_SURGERY_CENTER): Payer: Medicare Other | Admitting: Gastroenterology

## 2013-09-09 ENCOUNTER — Encounter: Payer: Self-pay | Admitting: Gastroenterology

## 2013-09-09 VITALS — BP 133/65 | HR 45 | Temp 95.3°F | Resp 13 | Ht 61.0 in | Wt 122.0 lb

## 2013-09-09 DIAGNOSIS — Z86010 Personal history of colonic polyps: Secondary | ICD-10-CM

## 2013-09-09 DIAGNOSIS — D128 Benign neoplasm of rectum: Secondary | ICD-10-CM

## 2013-09-09 DIAGNOSIS — D126 Benign neoplasm of colon, unspecified: Secondary | ICD-10-CM

## 2013-09-09 DIAGNOSIS — D129 Benign neoplasm of anus and anal canal: Secondary | ICD-10-CM

## 2013-09-09 MED ORDER — SODIUM CHLORIDE 0.9 % IV SOLN: 500.0000 mL | INTRAVENOUS | Status: DC

## 2013-09-09 NOTE — Op Note (Signed)
[facility]  [address] [place], [address]   COLONOSCOPY PROCEDURE REPORT  PATIENT: Tanya Knight, Tanya Knight  MR#: [id number] BIRTHDATE: 06/11/1940 , 72  yrs. old GENDER: Female ENDOSCOPIST: Milus Banister, MD PROCEDURE DATE:  09/09/2013 PROCEDURE:   Colonoscopy with snare polypectomy First Screening Colonoscopy - Avg.  risk and is 50 yrs.  old or older - No.  Prior Negative Screening - Now for repeat screening. N/A  History of Adenoma - Now for follow-up colonoscopy & has been > or = to 3 yrs.  No.  It has been less than 3 yrs since last colonoscopy.  Medical reason.  Polyps Removed Today? Yes. ASA CLASS:   Class II INDICATIONS:Adenomas and TVAs removed; sigmoidoscopy 2012, last full colonoscopy was 2010. MEDICATIONS: MAC sedation, administered by CRNA and Propofol (Diprivan) 160 mg IV  DESCRIPTION OF PROCEDURE:   After the risks benefits and alternatives of the procedure were thoroughly explained, informed consent was obtained.  A digital rectal exam revealed no abnormalities of the rectum.   The [id number]  endoscope was introduced through the anus and advanced to the cecum, which was identified by both the appendix and ileocecal valve. No adverse events experienced.   The quality of the prep was excellent.  The instrument was then slowly withdrawn as the colon was fully examined.  COLON FINDINGS: One polyp was found, removed and sent to pathology. This was sessile, located in descending segment, 39mm across, removed with cold snare.  The examination was otherwise normal including normal rectum at site of previous recurrent poylp. Retroflexed views revealed no abnormalities. The time to cecum=6 minutes 49 seconds.  Withdrawal time=10 minutes 22 seconds.  The scope was withdrawn and the procedure completed. COMPLICATIONS: There were no complications.  ENDOSCOPIC IMPRESSION: One polyp was found, removed and sent to pathology.  This was sessile,  located in descending segment, 76mm across, removed with cold snare.  The examination was otherwise normal including normal rectum at site of previous recurrent poylp.  RECOMMENDATIONS: If the polyp(s) removed today are proven to be adenomatous (pre-cancerous) polyps, you will need a repeat colonoscopy in 5 years.  You will receive a letter within 1-2 weeks with the results of your biopsy as well as final recommendations.  Please call my office if you have not received a letter after 3 weeks.   eSigned:  Milus Banister, MD 09/09/2013 9:42 AM   cc: Viviana Simpler, MD

## 2013-09-09 NOTE — Progress Notes (Signed)
On admission BP 158/118 left arm,  158/116 right arm. Manual measurements  169/110 left arm, 160/128 right.  Tanya Angst, CRNA advised.

## 2013-09-09 NOTE — Progress Notes (Signed)
A/ox3 pleased with MAC, report to Wendy RN 

## 2013-09-09 NOTE — Progress Notes (Signed)
Called to room to assist during endoscopic procedure.  Patient ID and intended procedure confirmed with present staff. Received instructions for my participation in the procedure from the performing physician.  

## 2013-09-09 NOTE — Patient Instructions (Signed)
YOU HAD AN ENDOSCOPIC PROCEDURE TODAY AT THE [facility]: Refer to the procedure report that was given to you for any specific questions about what was found during the examination.  If the procedure report does not answer your questions, please call your gastroenterologist to clarify.  If you requested that your care partner not be given the details of your procedure findings, then the procedure report has been included in a sealed envelope for you to review at your convenience later.  YOU SHOULD EXPECT: Some feelings of bloating in the abdomen. Passage of more gas than usual.  Walking can help get rid of the air that was put into your GI tract during the procedure and reduce the bloating. If you had a lower endoscopy (such as a colonoscopy or flexible sigmoidoscopy) you may notice spotting of blood in your stool or on the toilet paper. If you underwent a bowel prep for your procedure, then you may not have a normal bowel movement for a few days.  DIET: Your first meal following the procedure should be a light meal and then it is ok to progress to your normal diet.  A half-sandwich or bowl of soup is an example of a good first meal.  Heavy or fried foods are harder to digest and may make you feel nauseous or bloated.  Likewise meals heavy in dairy and vegetables can cause extra gas to form and this can also increase the bloating.  Drink plenty of fluids but you should avoid alcoholic beverages for 24 hours.  ACTIVITY: Your care partner should take you home directly after the procedure.  You should plan to take it easy, moving slowly for the rest of the day.  You can resume normal activity the day after the procedure however you should NOT DRIVE or use heavy machinery for 24 hours (because of the sedation medicines used during the test).    SYMPTOMS TO REPORT IMMEDIATELY: A gastroenterologist can be reached at any hour.  During normal business hours, 8:30 AM to 5:00 PM Monday through Friday,  call [phone].  After hours and on weekends, please call the GI answering service at [phone] who will take a message and have the physician on call contact you.   Following lower endoscopy (colonoscopy or flexible sigmoidoscopy):  Excessive amounts of blood in the stool  Significant tenderness or worsening of abdominal pains  Swelling of the abdomen that is new, acute  Fever of 100F or higher  FOLLOW UP: If any biopsies were taken you will be contacted by phone or by letter within the next 1-3 weeks.  Call your gastroenterologist if you have not heard about the biopsies in 3 weeks.  Our staff will call the home number listed on your records the next business day following your procedure to check on you and address any questions or concerns that you may have at that time regarding the information given to you following your procedure. This is a courtesy call and so if there is no answer at the home number and we have not heard from you through the emergency physician on call, we will assume that you have returned to your regular daily activities without incident.  SIGNATURES/CONFIDENTIALITY: You and/or your care partner have signed paperwork which will be entered into your electronic medical record.  These signatures attest to the fact that that the information above on your After Visit Summary has been reviewed and is understood.  Full responsibility of the confidentiality of this   discharge information lies with you and/or your care-partner.  Recommendations If the polyp removed today is adenomatous (pre-cancerous), next colonoscopy in 5 years.  Await pathology results, you'll receive a letter in 1-2 weeks. Call office if you haven't received report after 3 weeks.

## 2013-09-12 ENCOUNTER — Telehealth: Payer: Self-pay | Admitting: *Deleted

## 2013-09-12 NOTE — Telephone Encounter (Deleted)
  Follow up Call-  Call back number 09/09/2013 08/22/2011  Post procedure Call Back phone  # [phone]  Permission to leave phone message Yes Yes     Patient questions:  Do you have a fever, pain , or abdominal swelling? no Pain Score  0 *  Have you tolerated food without any problems? yes  Have you been able to return to your normal activities? yes  Do you have any questions about your discharge instructions: Diet   no Medications  no Follow up visit  no  Do you have questions or concerns about your Care? no  Actions: * If pain score is 4 or above: No action needed, pain <4.

## 2013-09-12 NOTE — Telephone Encounter (Signed)
Pt at "exercise class".  Left message with husband for pt to call back if she has questions or concerns.

## 2013-09-13 ENCOUNTER — Encounter: Payer: Self-pay | Admitting: Gastroenterology

## 2014-03-13 ENCOUNTER — Encounter: Payer: Self-pay | Admitting: Gastroenterology

## 2014-04-11 ENCOUNTER — Encounter: Payer: Self-pay | Admitting: Internal Medicine

## 2014-04-12 ENCOUNTER — Encounter: Payer: Self-pay | Admitting: *Deleted

## 2014-06-29 ENCOUNTER — Other Ambulatory Visit: Payer: Self-pay | Admitting: Internal Medicine

## 2014-09-08 ENCOUNTER — Encounter: Payer: Self-pay | Admitting: Internal Medicine

## 2014-09-08 ENCOUNTER — Ambulatory Visit (INDEPENDENT_AMBULATORY_CARE_PROVIDER_SITE_OTHER): Payer: Medicare Other | Admitting: Internal Medicine

## 2014-09-08 VITALS — BP 148/80 | HR 67 | Temp 98.3°F | Ht 61.0 in | Wt 127.0 lb

## 2014-09-08 DIAGNOSIS — G40909 Epilepsy, unspecified, not intractable, without status epilepticus: Secondary | ICD-10-CM | POA: Diagnosis not present

## 2014-09-08 DIAGNOSIS — E871 Hypo-osmolality and hyponatremia: Secondary | ICD-10-CM | POA: Insufficient documentation

## 2014-09-08 DIAGNOSIS — I10 Essential (primary) hypertension: Secondary | ICD-10-CM | POA: Diagnosis not present

## 2014-09-08 DIAGNOSIS — M810 Age-related osteoporosis without current pathological fracture: Secondary | ICD-10-CM

## 2014-09-08 DIAGNOSIS — Z0000 Encounter for general adult medical examination without abnormal findings: Secondary | ICD-10-CM

## 2014-09-08 DIAGNOSIS — K219 Gastro-esophageal reflux disease without esophagitis: Secondary | ICD-10-CM

## 2014-09-08 DIAGNOSIS — Z7189 Other specified counseling: Secondary | ICD-10-CM

## 2014-09-08 LAB — COMPREHENSIVE METABOLIC PANEL WITH GFR
ALT: 12 U/L (ref 0–35)
AST: 21 U/L (ref 0–37)
Albumin: 4.3 g/dL (ref 3.5–5.2)
Alkaline Phosphatase: 56 U/L (ref 39–117)
BUN: 12 mg/dL (ref 6–23)
CO2: 31 meq/L (ref 19–32)
Calcium: 10.5 mg/dL (ref 8.4–10.5)
Chloride: 94 meq/L — ABNORMAL LOW (ref 96–112)
Creatinine, Ser: 0.67 mg/dL (ref 0.40–1.20)
GFR: 91.55 mL/min
Glucose, Bld: 88 mg/dL (ref 70–99)
Potassium: 5.4 meq/L — ABNORMAL HIGH (ref 3.5–5.1)
Sodium: 131 meq/L — ABNORMAL LOW (ref 135–145)
Total Bilirubin: 0.6 mg/dL (ref 0.2–1.2)
Total Protein: 6.9 g/dL (ref 6.0–8.3)

## 2014-09-08 LAB — LIPID PANEL
CHOL/HDL RATIO: 2
Cholesterol: 191 mg/dL (ref 0–200)
HDL: 87.9 mg/dL (ref >39.00)
LDL Cholesterol: 89 mg/dL (ref 0–99)
NonHDL: 103.1
TRIGLYCERIDES: 71 mg/dL (ref 0.0–149.0)
VLDL: 14.2 mg/dL (ref 0.0–40.0)

## 2014-09-08 LAB — CBC WITH DIFFERENTIAL/PLATELET
BASOS ABS: 0.1 10*3/uL (ref 0.0–0.1)
BASOS PCT: 1.1 % (ref 0.0–3.0)
EOS ABS: 0.4 10*3/uL (ref 0.0–0.7)
Eosinophils Relative: 6.3 % — ABNORMAL HIGH (ref 0.0–5.0)
HCT: 42.8 % (ref 36.0–46.0)
HEMOGLOBIN: 14.3 g/dL (ref 12.0–15.0)
LYMPHS ABS: 1.1 10*3/uL (ref 0.7–4.0)
LYMPHS PCT: 18.6 % (ref 12.0–46.0)
MCHC: 33.3 g/dL (ref 30.0–36.0)
MCV: 94.2 fl (ref 78.0–100.0)
MONOS PCT: 11.6 % (ref 3.0–12.0)
Monocytes Absolute: 0.7 10*3/uL (ref 0.1–1.0)
Neutro Abs: 3.6 10*3/uL (ref 1.4–7.7)
Neutrophils Relative %: 62.4 % (ref 43.0–77.0)
PLATELETS: 216 10*3/uL (ref 150.0–400.0)
RBC: 4.54 Mil/uL (ref 3.87–5.11)
RDW: 14.8 % (ref 11.5–15.5)
WBC: 5.7 10*3/uL (ref 4.0–10.5)

## 2014-09-08 LAB — T4, FREE: FREE T4: 0.74 ng/dL (ref 0.60–1.60)

## 2014-09-08 MED ORDER — ESTRADIOL 2 MG VA RING: 2.0000 mg | VAGINAL_RING | VAGINAL | Status: DC

## 2014-09-08 MED ORDER — METOPROLOL SUCCINATE ER 50 MG PO TB24: 50.0000 mg | ORAL_TABLET | Freq: Every day | ORAL | Status: DC

## 2014-09-08 MED ORDER — DIVALPROEX SODIUM 500 MG PO DR TAB: 500.0000 mg | DELAYED_RELEASE_TABLET | Freq: Two times a day (BID) | ORAL | Status: DC

## 2014-09-08 MED ORDER — AMLODIPINE BESYLATE 2.5 MG PO TABS: 2.5000 mg | ORAL_TABLET | Freq: Every day | ORAL | Status: DC

## 2014-09-08 MED ORDER — IBANDRONATE SODIUM 150 MG PO TABS: ORAL_TABLET | ORAL | Status: DC

## 2014-09-08 MED ORDER — VITAMIN D (ERGOCALCIFEROL) 1.25 MG (50000 UNIT) PO CAPS: ORAL_CAPSULE | ORAL | Status: DC

## 2014-09-08 MED ORDER — LOSARTAN POTASSIUM 100 MG PO TABS: 100.0000 mg | ORAL_TABLET | Freq: Every day | ORAL | Status: DC

## 2014-09-08 NOTE — Assessment & Plan Note (Signed)
None despite reducing her depakote dosing  Will recheck the levels

## 2014-09-08 NOTE — Assessment & Plan Note (Signed)
I have personally reviewed the Medicare Annual Wellness questionnaire and have noted 1. The patient's medical and social history 2. Their use of alcohol, tobacco or illicit drugs 3. Their current medications and supplements 4. The patient's functional ability including ADL's, fall risks, home safety risks and hearing or visual             impairment. 5. Diet and physical activities 6. Evidence for depression or mood disorders  The patients weight, height, BMI and visual acuity have been recorded in the chart I have made referrals, counseling and provided education to the patient based review of the above and I have provided the pt with a written personalized care plan for preventive services.  I have provided you with a copy of your personalized plan for preventive services. Please take the time to review along with your updated medication list.  UTD on imms--due for Td in 2018 Mammogram due 03/2016. No breast exam after discussion Colonoscopy per Dr Ardis Hughs (?2018 for polyps) No PAP due to age Active with exercise, etc

## 2014-09-08 NOTE — Progress Notes (Signed)
Subjective:    Patient ID: Tanya Knight, female    DOB: [birth year], 74 y.o.   MRN: [id number]  HPI Here for Medicare wellness visit and follow up of chronic medical problems Reviewed form and advanced directives Reviewed other physicians Will have 1 glass of wine twice a week No tobacco Exercises regularly Vision is fine Some hearing problems but seems stable No falls No depression or anhedonia Independent with all instrumental ADLs No apparent cognitive problems  Discussed the cancelling of the DEXA Due for ibandronate till 2018 Will recheck when she is done with Rx  No seizures  Is tapering the depakote Now taking 4500mg  weekly  Checks her BP once or twice a month Average 132/83 Rarely she gets systolic over 409--[id number] 120-130 No headaches No palpitations No dizziness or syncope No AM edema--may have some evening puffiness  Does get some heartburn at times Careful to not eat close to bedtime Will rarely take tums, etc No dysphagia  Current Outpatient Prescriptions on File Prior to Visit  Medication Sig Dispense Refill  . amLODipine (NORVASC) 2.5 MG tablet Take 1 tablet (2.5 mg total) by mouth daily. 90 tablet 3  . aspirin 81 MG tablet Take 81 mg by mouth daily.      Marland Kitchen CALCIUM PO Take by mouth.      . divalproex (DEPAKOTE) 500 MG DR tablet Take 1 tablet (500 mg total) by mouth 2 (two) times daily. 180 tablet 3  . estradiol (ESTRING) 2 MG vaginal ring Place 2 mg vaginally every 3 (three) months. follow package directions 1 each 4  . ibandronate (BONIVA) 150 MG tablet TAKE 1 TABLET BY MOUTH EVERY MONTH IN THE MORNING WITH WATER. DO NOT LIE DOWN OR EAT FOR 30 MINUTES AFTER 3 tablet 3  . losartan (COZAAR) 100 MG tablet Take 1 tablet (100 mg total) by mouth daily. 90 tablet 3  . metoprolol succinate (TOPROL-XL) 100 MG 24 hr tablet TAKE 1/2 TABLET BY MOUTH EVERY DAY 90 tablet 3  . Multiple Vitamin (MULTIVITAMIN) capsule Take 1 capsule by mouth daily.      . Multiple  Vitamins-Minerals (OCUVITE PO) Take by mouth.      . Omega-3 Fatty Acids (FISH OIL PO) Take by mouth.      . Vitamin D, Ergocalciferol, (DRISDOL) 50000 UNITS CAPS capsule TAKE 1 CAPSULE BY MOUTH EVERY 14 DAYS 10 capsule 1   No current facility-administered medications on file prior to visit.    Allergies  Allergen Reactions  . Phenytoin     Other reaction(s): Other (See Comments) Other Reaction: Other reaction REACTION: severe reaction    Past Medical History  Diagnosis Date  . GERD (gastroesophageal reflux disease)   . Osteoporosis   . Seizure disorder   . Vitamin D deficiency   . Fracture of metatarsal     Repair of fractured R metatarsal --Dr Duda---4/08  . Hypertension   . Seizures     Past Surgical History  Procedure Laterality Date  . Tonsillectomy and adenoidectomy  1948  . Foot surgery  2008  . Eye surgery      Obstructed tear duct  . Tear duct probing  10/12    temporary stent  . Cataract extraction, bilateral Bilateral 2014    Family History  Problem Relation Age of Onset  . Hypertension Mother   . Breast cancer Daughter   . Diabetes Maternal Aunt   . Coronary artery disease Paternal Aunt   . Breast cancer Paternal Aunt   .  Coronary artery disease Paternal Uncle   . Heart disease Maternal Grandmother   . Heart disease Maternal Grandfather   . Heart disease Paternal Grandmother   . Heart disease Paternal Grandfather   . Heart disease Father   . Colon cancer Neg Hx   . Stomach cancer Neg Hx   . Pancreatic cancer Neg Hx   . Rectal cancer Neg Hx     History   Social History  . Marital Status: Married    Spouse Name: N/A  . Number of Children: 3  . Years of Education: N/A   Occupational History  . retired Control and instrumentation engineer    Social History Main Topics  . Smoking status: Never Smoker   . Smokeless tobacco: Never Used  . Alcohol Use: Yes     Comment: occasional  . Drug Use: No  . Sexual Activity: Yes    Birth Control/ Protection:  Post-menopausal   Other Topics Concern  . Not on file   Social History Narrative   Regular exercise-yes---aerobics, Pilates, jogged in past (now walks)      Has living will   Husband, then one of her children, has health care POA   Would accept resuscitation but no prolonged artificial ventilation   Probably would not want tube feeds if cognitively unaware   Review of Systems Occasional constipation--metamucil daily and sometimes takes miralax. Eats healthy Appetite is good Weight is up 5# since last year No urinary problems Ongoing mild back pain--no meds for this. Does do core work Engineer, production with dentist Wears seat belt    Objective:   Physical Exam  Constitutional: She is oriented to person, place, and time. She appears well-developed and well-nourished. No distress.  HENT:  Mouth/Throat: Oropharynx is clear and moist. No oropharyngeal exudate.  Neck: Normal range of motion. Neck supple. No thyromegaly present.  Cardiovascular: Normal rate, regular rhythm, normal heart sounds and intact distal pulses.  Exam reveals no gallop.   No murmur heard. Pulmonary/Chest: Effort normal and breath sounds normal. No respiratory distress. She has no wheezes. She has no rales.  Abdominal: Soft. There is no tenderness.  Lymphadenopathy:    She has no cervical adenopathy.  Neurological: She is alert and oriented to person, place, and time.  President-- "Abbe [place], [place]" [phone] [id number] Recall 3/3  Skin: No rash noted. No erythema.  Psychiatric: She has a normal mood and affect. Her behavior is normal.          Assessment & Plan:

## 2014-09-08 NOTE — Assessment & Plan Note (Signed)
See social history 

## 2014-09-08 NOTE — Assessment & Plan Note (Signed)
BP Readings from Last 3 Encounters:  09/08/14 148/80  09/09/13 133/65  09/06/13 148/82  always has white coat component This is okay for age based on current recommendations--but in 120-130 range at home anyway

## 2014-09-08 NOTE — Assessment & Plan Note (Signed)
Still on boniva,calcium, vitamin D Recheck DEXA 2018 when boniva finished

## 2014-09-08 NOTE — Assessment & Plan Note (Signed)
This goes back many years Last 128 Will recheck

## 2014-09-08 NOTE — Progress Notes (Signed)
Pre visit review using our clinic review tool, if applicable. No additional management support is needed unless otherwise documented below in the visit note. 

## 2014-09-08 NOTE — Assessment & Plan Note (Signed)
Only occasional symptoms 

## 2014-09-09 LAB — VALPROIC ACID LEVEL: VALPROIC ACID LVL: 70 ug/mL (ref 50.0–100.0)

## 2014-09-11 ENCOUNTER — Encounter: Payer: Self-pay | Admitting: *Deleted

## 2014-12-08 ENCOUNTER — Ambulatory Visit (INDEPENDENT_AMBULATORY_CARE_PROVIDER_SITE_OTHER)
Admission: RE | Admit: 2014-12-08 | Discharge: 2014-12-08 | Disposition: A | Discharge status: Home / Self Care | Payer: Medicare Other | Source: Ambulatory Visit | Attending: Family Medicine | Admitting: Family Medicine

## 2014-12-08 ENCOUNTER — Telehealth: Payer: Self-pay | Admitting: Family Medicine

## 2014-12-08 ENCOUNTER — Ambulatory Visit (INDEPENDENT_AMBULATORY_CARE_PROVIDER_SITE_OTHER): Payer: Medicare Other | Admitting: Family Medicine

## 2014-12-08 ENCOUNTER — Encounter: Payer: Self-pay | Admitting: Family Medicine

## 2014-12-08 VITALS — BP 130/74 | HR 72 | Temp 98.4°F | Ht 61.5 in | Wt 126.2 lb

## 2014-12-08 DIAGNOSIS — J181 Lobar pneumonia, unspecified organism: Secondary | ICD-10-CM | POA: Insufficient documentation

## 2014-12-08 DIAGNOSIS — J209 Acute bronchitis, unspecified: Secondary | ICD-10-CM | POA: Diagnosis not present

## 2014-12-08 DIAGNOSIS — R911 Solitary pulmonary nodule: Secondary | ICD-10-CM

## 2014-12-08 IMAGING — CR DG CHEST 2V
2 series · 2 of 2 positions shown · non-contrast
Comparison: None.

CLINICAL DATA: Cough and left-sided chest pain

EXAM:
CHEST - 2 VIEW

[view not recorded (1 of 2)]
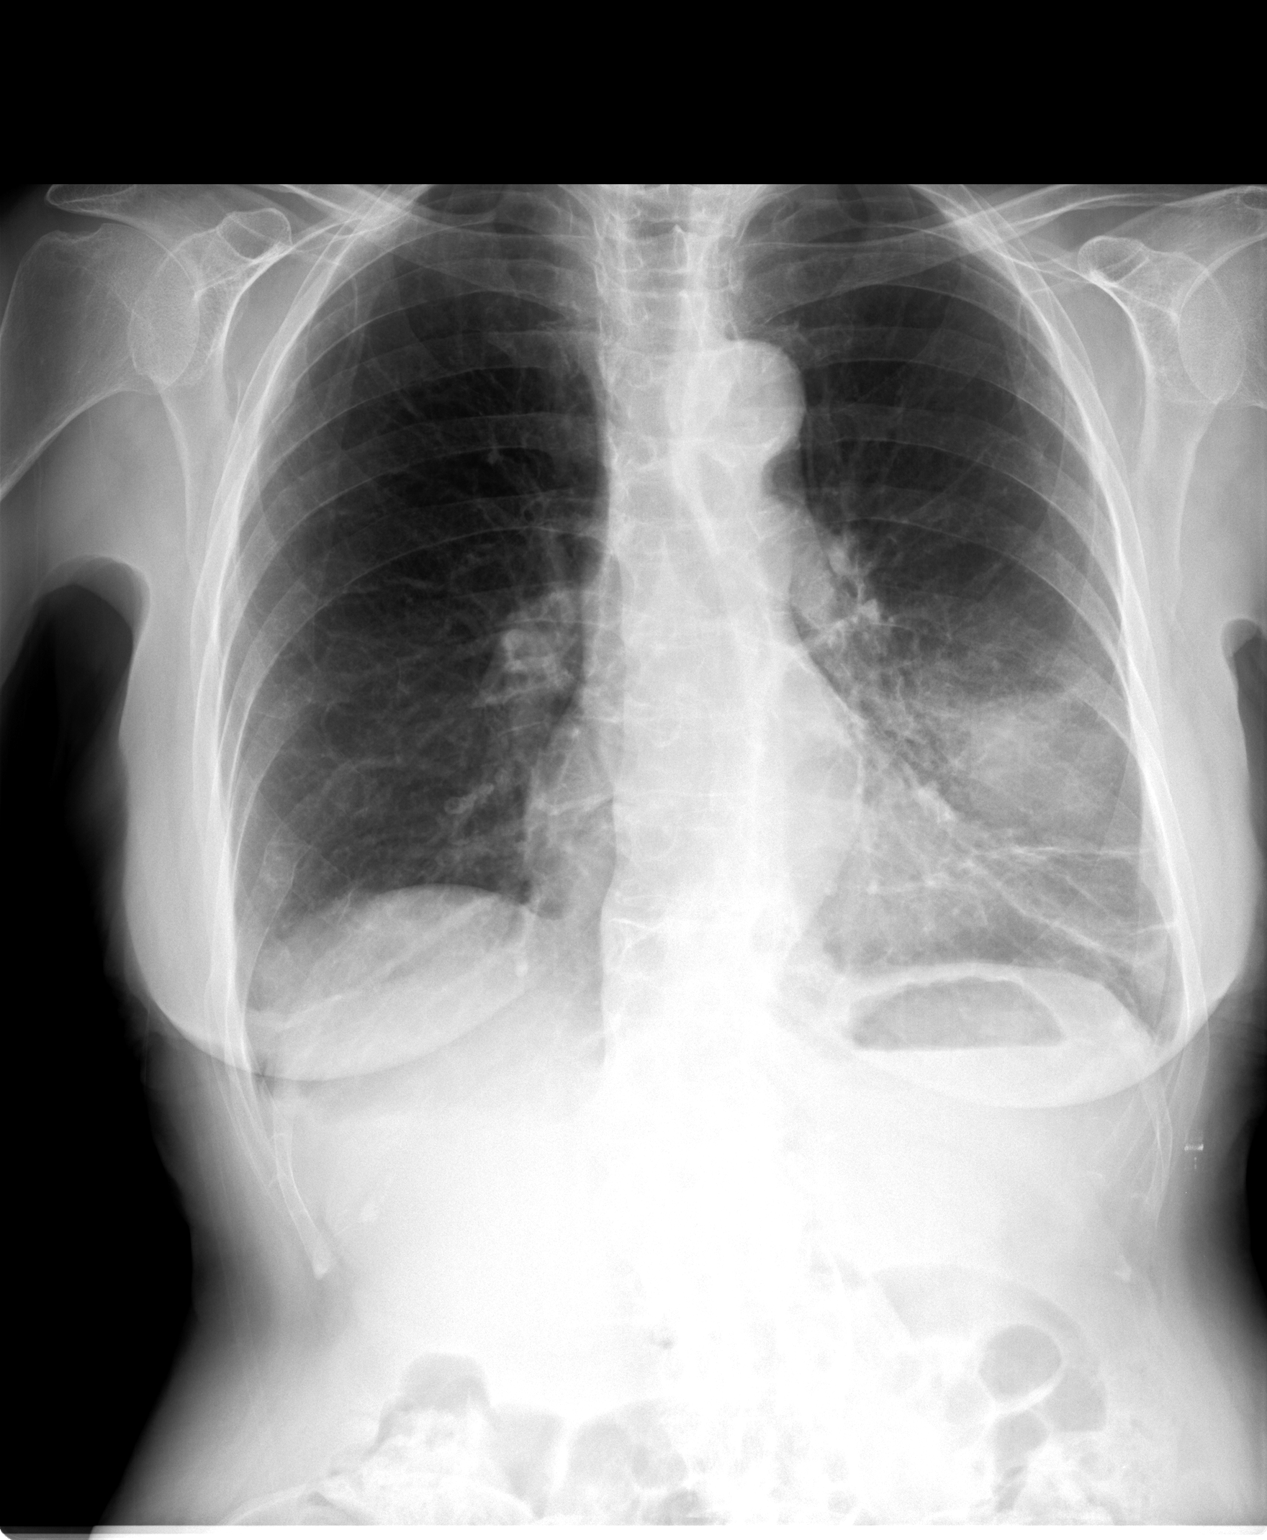

[view not recorded (2 of 2)]
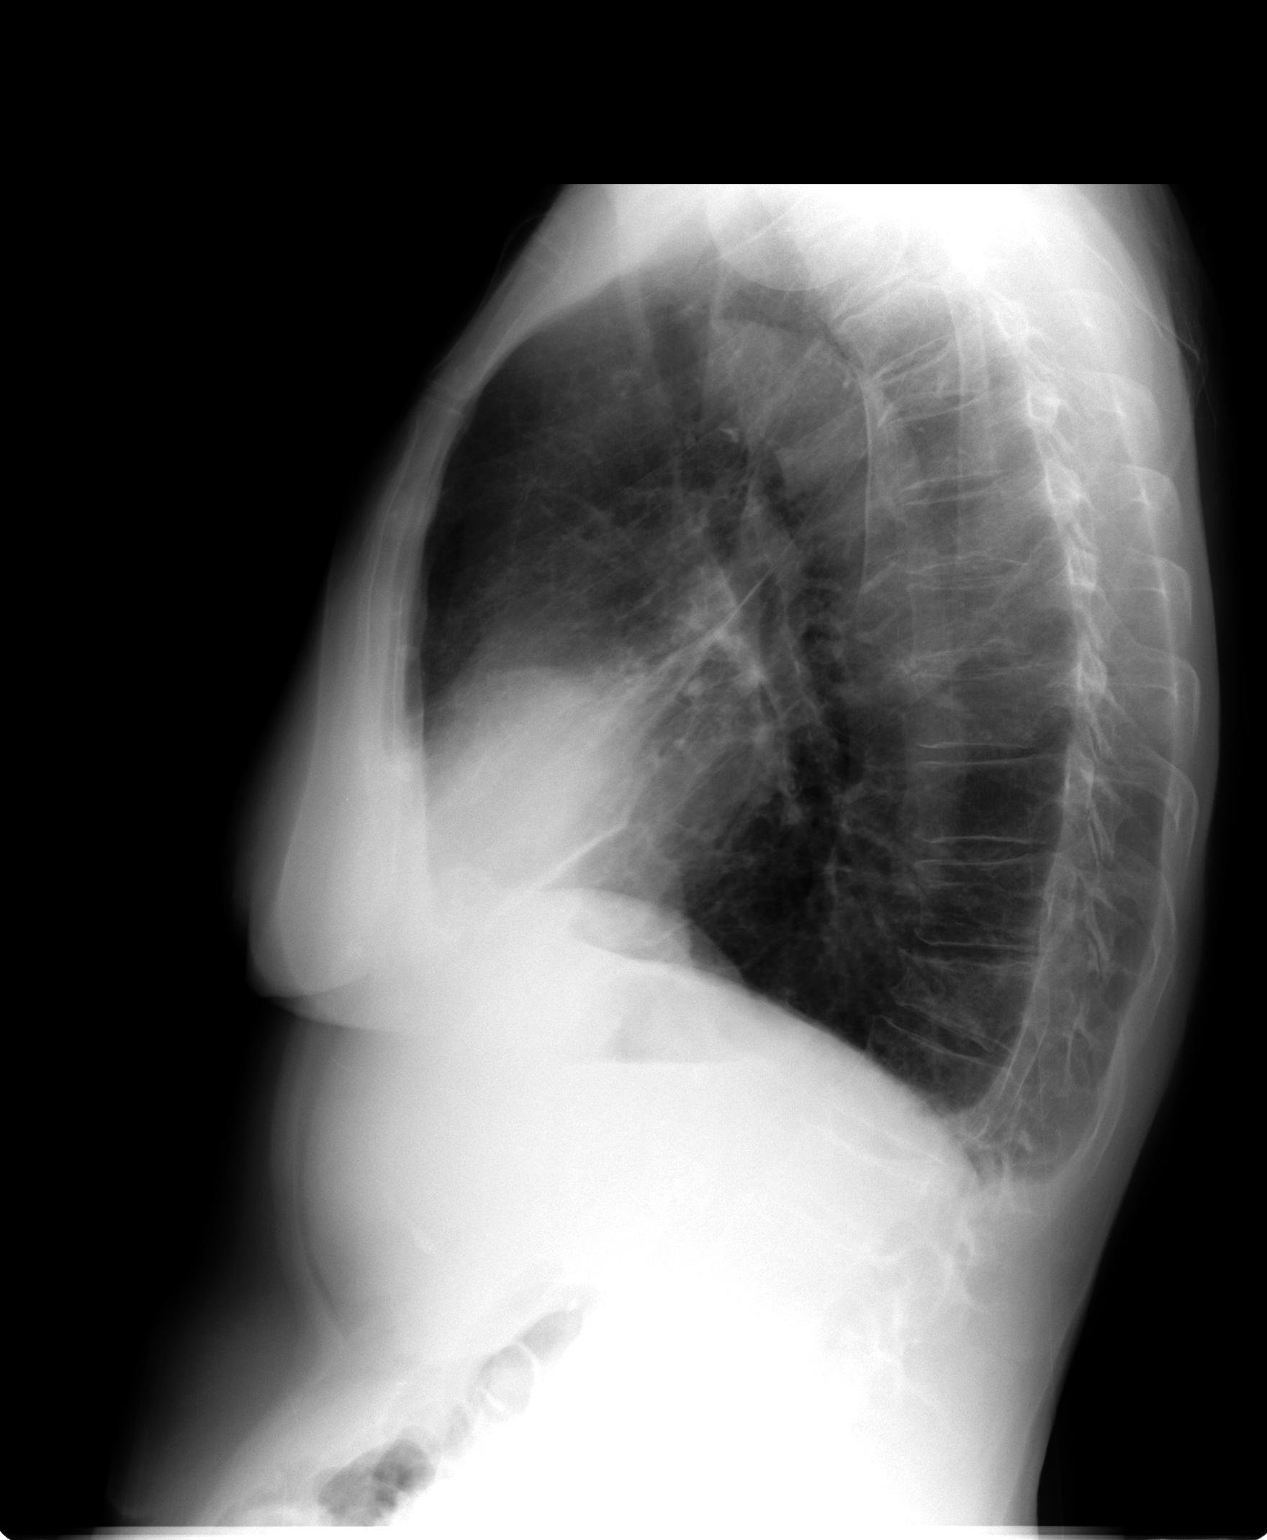

[2 of 2 positions shown; findings below may reference images not displayed]

FINDINGS: Cardiac shadow is within normal limits. A rounded masslike density
is noted in the left lung base projecting in the lingula measuring
approximately 4.3 cm. Some scarring is noted in the bases
bilaterally. Old rib fractures are noted on the right.
IMPRESSION: 4.3 cm masslike density in the lingula on the left. Although this
may represent a round pneumonia, the possibility of an underlying
mass lesion cannot be totally excluded. CT of the chest is
recommended for further evaluation.

These results will be called to the ordering clinician or
representative by the Radiologist Assistant, and communication
documented in the PACS or zVision Dashboard.

## 2014-12-08 MED ORDER — AZITHROMYCIN 250 MG PO TABS: ORAL_TABLET | ORAL | Status: DC

## 2014-12-08 MED ORDER — HYDROCODONE-HOMATROPINE 5-1.5 MG/5ML PO SYRP: 5.0000 mL | ORAL_SOLUTION | Freq: Three times a day (TID) | ORAL | Status: DC | PRN

## 2014-12-08 NOTE — Progress Notes (Signed)
Pre visit review using our clinic review tool, if applicable. No additional management support is needed unless otherwise documented below in the visit note. 

## 2014-12-08 NOTE — Patient Instructions (Signed)
I sent zpak to your pharmacy  Take the cough med px in yourself  Drink lots of fluids and rest  Return after 2:30 for your chest xray

## 2014-12-08 NOTE — Telephone Encounter (Signed)
CT ordered -will route to [facility] for monday

## 2014-12-08 NOTE — Assessment & Plan Note (Signed)
Reassuring exam but given the L sided cp/ purulent sputum fatigue and feverish symptoms-want to r/o pneumonia  cxr today  Cover with zpak  Also hycodan for cough with caution of sedation Disc symptomatic care - see instructions on AVS  Update if not starting to improve in a week or if worsening  -esp if sob or wheeze or fever

## 2014-12-08 NOTE — Telephone Encounter (Signed)
-----   Message from Tammi Sou, [place] sent at 12/08/2014  4:50 PM EDT ----- Pt notified of xray results and Dr. Marliss Coots comments. Pt agrees with getting a CT scan

## 2014-12-08 NOTE — Progress Notes (Signed)
Subjective:    Patient ID: Tanya Knight, female    DOB: 11-05-1940, 74 y.o.   MRN: [id number]  HPI Here with chest  congestion   Last week did not feel great  Noticed soreness in chest with deep breaths-that got better the next day   Then some soreness in the back of neck on L going to her arm  Then began a dry cough  (seldom phlegm - but scant and dark in color)  Wakes up sweaty and has had chills Very tired Not sleeping well - coughing and sore in L chest wall -hard to get comfortable   No wheezing but chest feels tight   No otc meds   No nasal symptoms No st or ear pain  No rash   Patient Active Problem List   Diagnosis Date Noted  . Hyponatremia 09/08/2014  . Advance directive discussed with patient 09/08/2014  . Routine general medical examination at a health care facility 09/03/2011  . Essential hypertension, benign 10/12/2006  . GERD 10/12/2006  . Osteoporosis 10/12/2006  . Seizure disorder 10/12/2006   Past Medical History  Diagnosis Date  . GERD (gastroesophageal reflux disease)   . Osteoporosis   . Seizure disorder   . Vitamin D deficiency   . Fracture of metatarsal     Repair of fractured R metatarsal --Dr Duda---4/08  . Hypertension   . Seizures    Past Surgical History  Procedure Laterality Date  . Tonsillectomy and adenoidectomy  1948  . Foot surgery  2008  . Eye surgery      Obstructed tear duct  . Tear duct probing  10/12    temporary stent  . Cataract extraction, bilateral Bilateral 2014   History  Substance Use Topics  . Smoking status: Never Smoker   . Smokeless tobacco: Never Used  . Alcohol Use: 0.0 oz/week    0 Standard drinks or equivalent per week     Comment: occasional   Family History  Problem Relation Age of Onset  . Hypertension Mother   . Breast cancer Daughter   . Diabetes Maternal Aunt   . Coronary artery disease Paternal Aunt   . Breast cancer Paternal Aunt   . Coronary artery disease Paternal Uncle   . Heart  disease Maternal Grandmother   . Heart disease Maternal Grandfather   . Heart disease Paternal Grandmother   . Heart disease Paternal Grandfather   . Heart disease Father   . Colon cancer Neg Hx   . Stomach cancer Neg Hx   . Pancreatic cancer Neg Hx   . Rectal cancer Neg Hx    Allergies  Allergen Reactions  . Phenytoin     Other reaction(s): Other (See Comments) Other Reaction: Other reaction REACTION: severe reaction   Current Outpatient Prescriptions on File Prior to Visit  Medication Sig Dispense Refill  . amLODipine (NORVASC) 2.5 MG tablet Take 1 tablet (2.5 mg total) by mouth daily. 90 tablet 3  . aspirin 81 MG tablet Take 81 mg by mouth daily.      Marland Kitchen CALCIUM PO Take by mouth.      . divalproex (DEPAKOTE) 500 MG DR tablet Take 1 tablet (500 mg total) by mouth 2 (two) times daily. 180 tablet 3  . estradiol (ESTRING) 2 MG vaginal ring Place 2 mg vaginally every 3 (three) months. follow package directions 1 each 4  . ibandronate (BONIVA) 150 MG tablet TAKE 1 TABLET BY MOUTH EVERY MONTH IN THE MORNING WITH WATER. DO  NOT LIE DOWN OR EAT FOR 30 MINUTES AFTER 3 tablet 3  . losartan (COZAAR) 100 MG tablet Take 1 tablet (100 mg total) by mouth daily. 90 tablet 3  . metoprolol succinate (TOPROL-XL) 50 MG 24 hr tablet Take 1 tablet (50 mg total) by mouth daily. Take with or immediately following a meal. 90 tablet 3  . Multiple Vitamin (MULTIVITAMIN) capsule Take 1 capsule by mouth daily.      . Multiple Vitamins-Minerals (OCUVITE PO) Take by mouth.      . Omega-3 Fatty Acids (FISH OIL PO) Take by mouth.      . Vitamin D, Ergocalciferol, (DRISDOL) 50000 UNITS CAPS capsule TAKE 1 CAPSULE BY MOUTH EVERY 14 DAYS 10 capsule 2   No current facility-administered medications on file prior to visit.     Review of Systems Review of Systems  Constitutional: Negative for, appetite change,  and unexpected weight change. pos for malaise/chills/sweats  ENT neg for cong or ST Eyes: Negative for pain  and visual disturbance.  Respiratory: Negative for wheeze  and shortness of breath.  pos for chest wall soreness  Cardiovascular: Negative for cp or palpitations    Gastrointestinal: Negative for nausea, diarrhea and constipation.  Genitourinary: Negative for urgency and frequency.  Skin: Negative for pallor or rash   Neurological: Negative for weakness, light-headedness, numbness and headaches.  Hematological: Negative for adenopathy. Does not bruise/bleed easily.  Psychiatric/Behavioral: Negative for dysphoric mood. The patient is not nervous/anxious.         Objective:   Physical Exam  Constitutional: She appears well-developed and well-nourished. No distress.  HENT:  Head: Normocephalic and atraumatic.  Right Ear: External ear normal.  Left Ear: External ear normal.  Nose: Nose normal.  Mouth/Throat: Oropharynx is clear and moist. No oropharyngeal exudate.  Eyes: Conjunctivae and EOM are normal. Pupils are equal, round, and reactive to light. Right eye exhibits no discharge. Left eye exhibits no discharge. No scleral icterus.  Neck: Normal range of motion. Neck supple.  Cardiovascular: Normal rate, regular rhythm and normal heart sounds.   Pulmonary/Chest: Effort normal and breath sounds normal. No respiratory distress. She has no wheezes. She has no rales. She exhibits tenderness.  Harsh bs with scattered rhonchi Good air exch CW tenderness worse in L lateral ribs  No crepitus or skin change   Abdominal: Soft. Bowel sounds are normal. There is no tenderness.  Lymphadenopathy:    She has no cervical adenopathy.  Neurological: She is alert.  Skin: Skin is warm and dry. No rash noted. No pallor.  Psychiatric: She has a normal mood and affect.          Assessment & Plan:   Problem List Items Addressed This Visit    Acute bronchitis - Primary    Reassuring exam but given the L sided cp/ purulent sputum fatigue and feverish symptoms-want to r/o pneumonia  cxr today    Cover with zpak  Also hycodan for cough with caution of sedation Disc symptomatic care - see instructions on AVS  Update if not starting to improve in a week or if worsening  -esp if sob or wheeze or fever        Relevant Orders   DG Chest 2 View (Completed)

## 2014-12-11 ENCOUNTER — Ambulatory Visit
Admission: RE | Admit: 2014-12-11 | Discharge: 2014-12-11 | Disposition: A | Discharge status: Home / Self Care | Payer: Medicare Other | Source: Ambulatory Visit | Attending: Family Medicine | Admitting: Family Medicine

## 2014-12-11 ENCOUNTER — Telehealth: Payer: Self-pay | Admitting: Family Medicine

## 2014-12-11 DIAGNOSIS — I10 Essential (primary) hypertension: Secondary | ICD-10-CM

## 2014-12-11 DIAGNOSIS — J90 Pleural effusion, not elsewhere classified: Secondary | ICD-10-CM | POA: Diagnosis not present

## 2014-12-11 DIAGNOSIS — I700 Atherosclerosis of aorta: Secondary | ICD-10-CM | POA: Diagnosis not present

## 2014-12-11 DIAGNOSIS — K449 Diaphragmatic hernia without obstruction or gangrene: Secondary | ICD-10-CM | POA: Insufficient documentation

## 2014-12-11 DIAGNOSIS — J984 Other disorders of lung: Secondary | ICD-10-CM | POA: Diagnosis present

## 2014-12-11 DIAGNOSIS — M4854XA Collapsed vertebra, not elsewhere classified, thoracic region, initial encounter for fracture: Secondary | ICD-10-CM | POA: Insufficient documentation

## 2014-12-11 DIAGNOSIS — R911 Solitary pulmonary nodule: Secondary | ICD-10-CM

## 2014-12-11 IMAGING — CT CT CHEST W/O CM
1 series · 15 of 31 positions shown, 19 images · non-contrast
Comparison: Chest x-ray [DATE]

CLINICAL DATA: Abnormal chest x-ray

EXAM:
CT CHEST WITHOUT CONTRAST
TECHNIQUE: Multidetector CT imaging of the chest was performed following the
standard protocol without IV contrast.

[Series 2: routine chest wo · axial · 0.60mm/px · z∈[-805,-540]mm · 15 of 59 slices shown, 19 images]
[im 3/59  mediastinal]
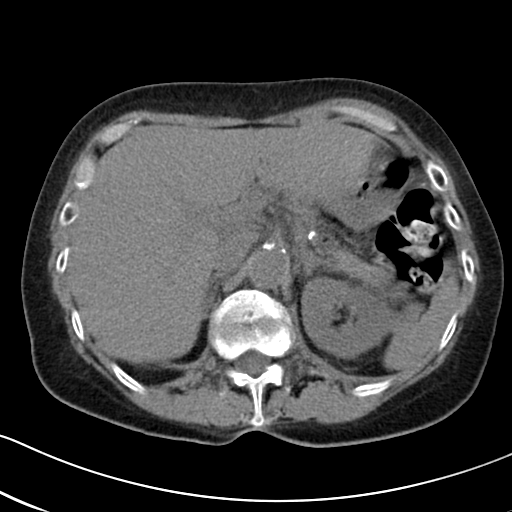
[im 3/59  lung]
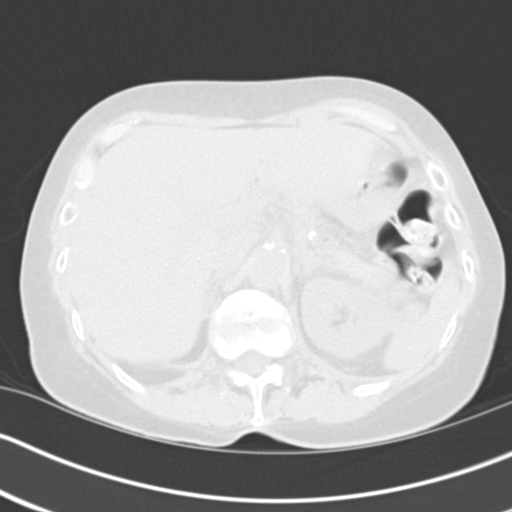
[im 7/59  lung]
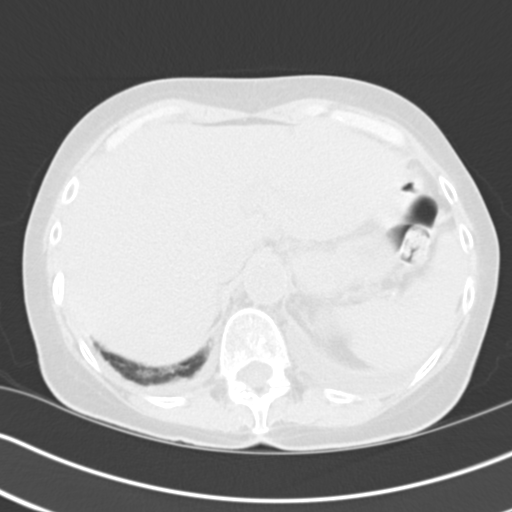
[im 11/59  lung]
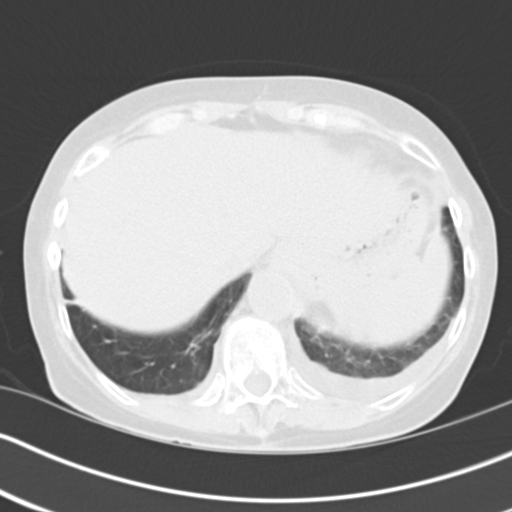
[im 13/59  lung]
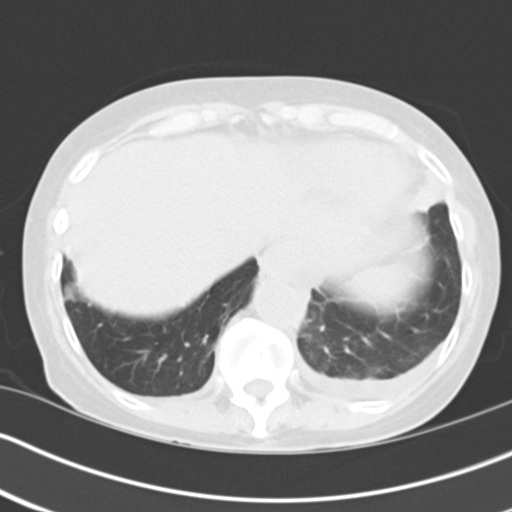
[im 18/59  mediastinal]
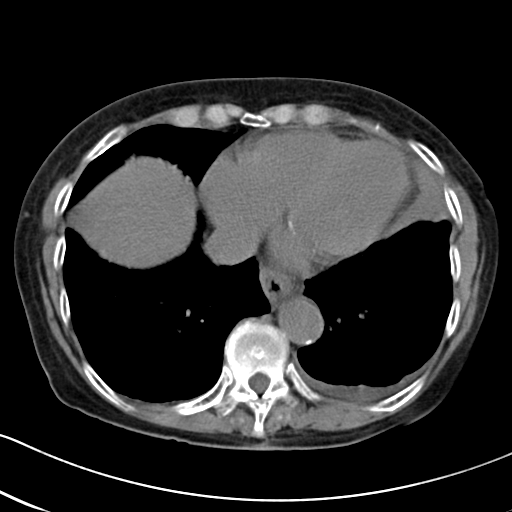
[im 18/59  lung]
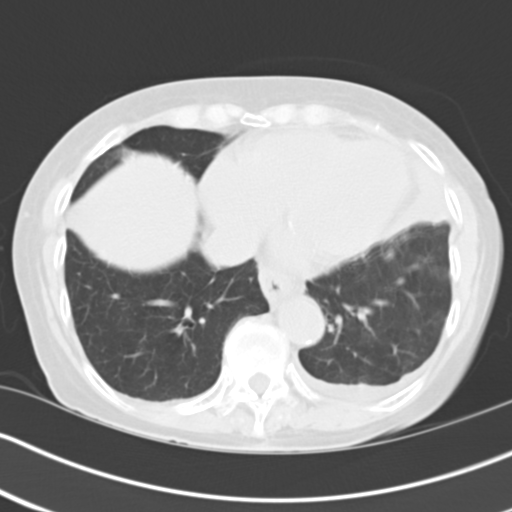
[im 22/59  lung]
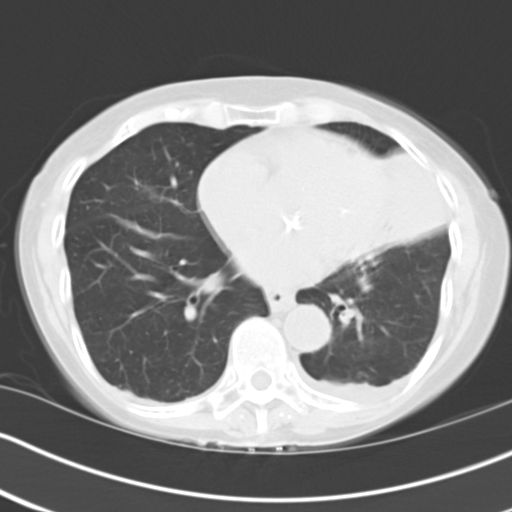
[im 26/59  lung]
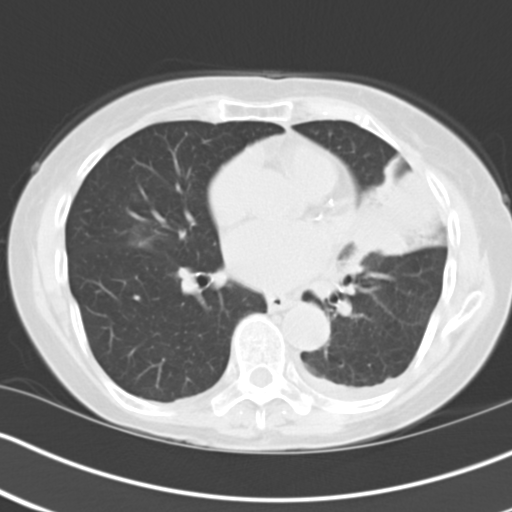
[im 31/59  lung]
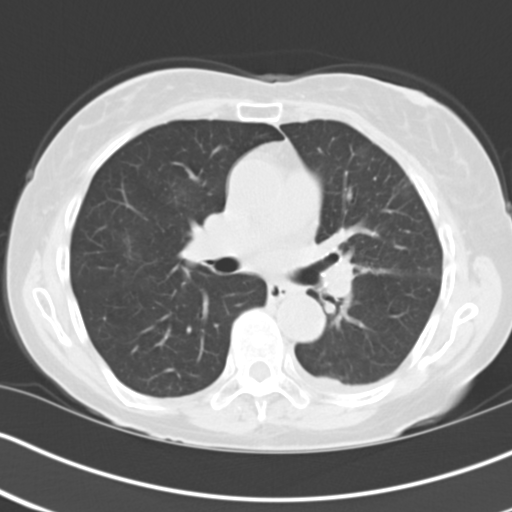
[im 33/59  mediastinal]
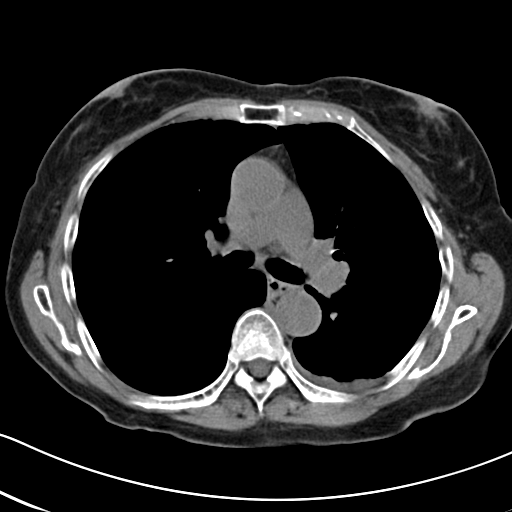
[im 33/59  lung]
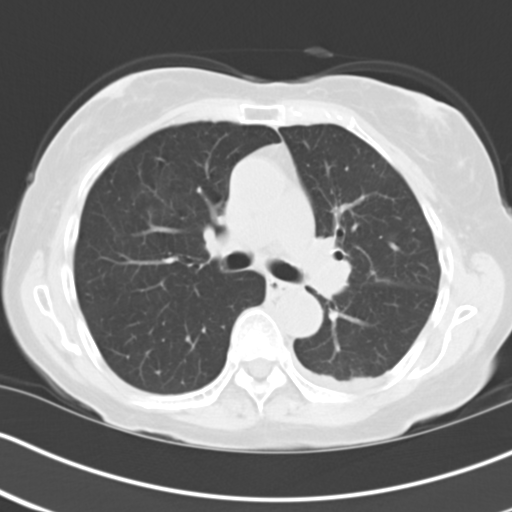
[im 37/59  lung]
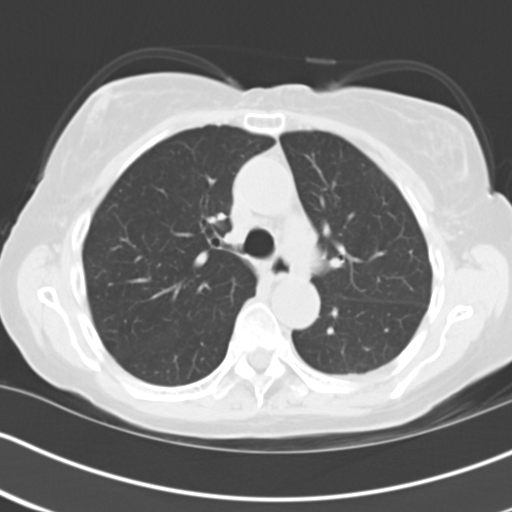
[im 41/59  lung]
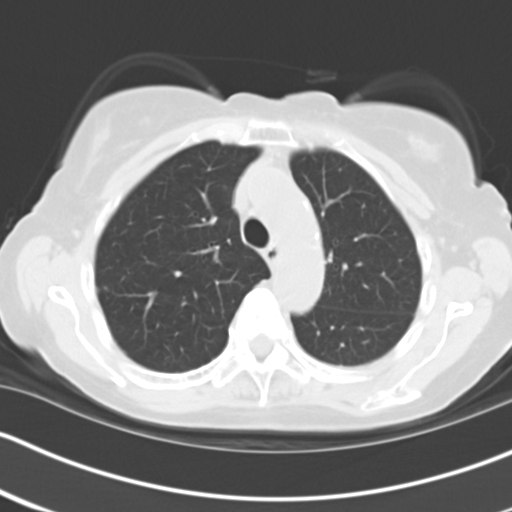
[im 46/59  lung]
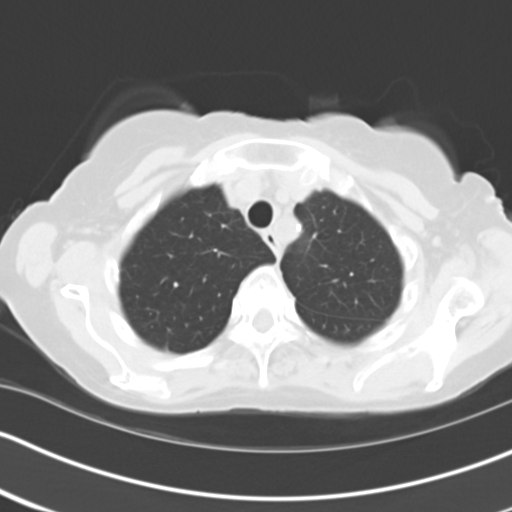
[im 48/59  mediastinal]
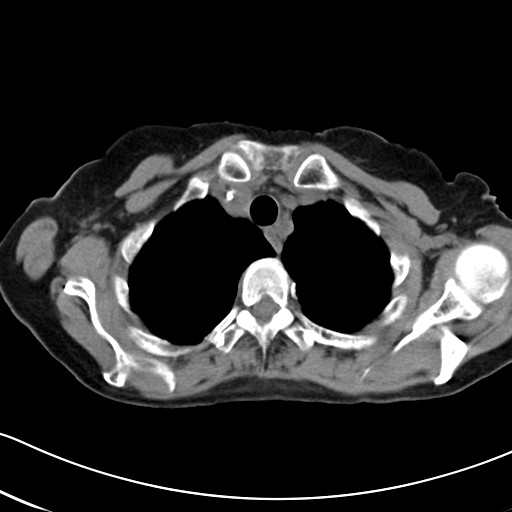
[im 48/59  lung]
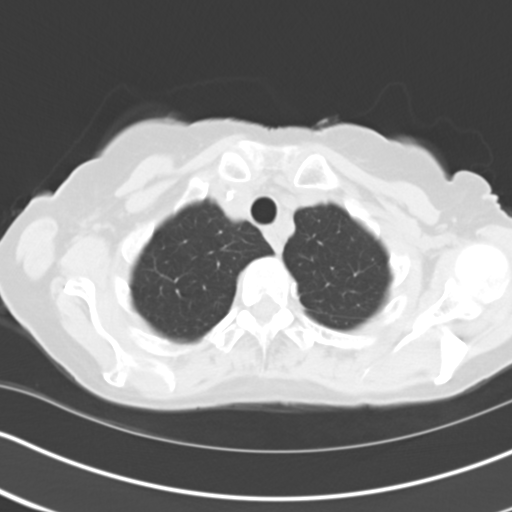
[im 52/59  lung]
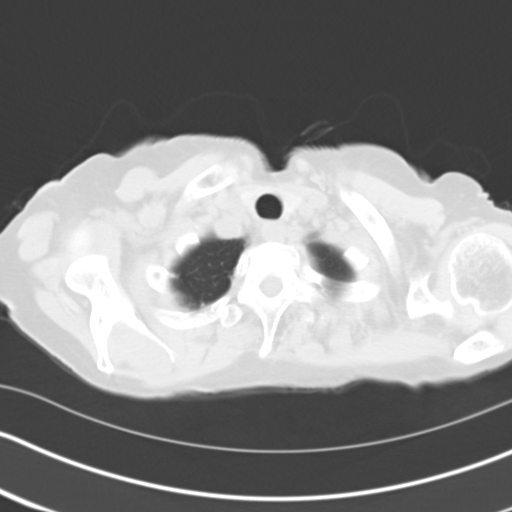
[im 56/59  lung]
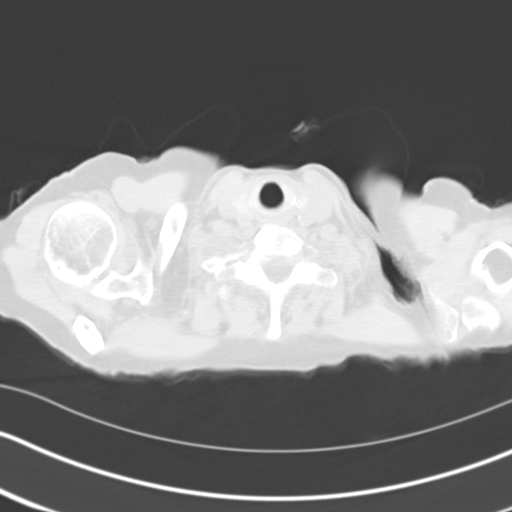

[15 of 31 positions shown; findings below may reference images not displayed]

FINDINGS: Sagittal images of the spine shows significant compression deformity
of T5 vertebral body of indeterminate age. There is mild to moderate
compression deformity upper endplate of T12 vertebral body of
indeterminate age. Clinical correlation is necessary.

Heart size within normal limits. Atherosclerotic calcifications of
thoracic aorta and coronary arteries. No pericardial effusion. Small
hiatal hernia is noted. Atherosclerotic calcifications of abdominal
aorta and splenic artery.

There is no mediastinal or hilar adenopathy. Axial image 33 there is
masslike consolidation in lingula measures at least 5.6 x 5.5 cm.
This is best visualized on coronal image 44. There is some air
bronchogram medially therefore suspicious for infiltrate/ pneumonia.
A underlying mass cannot be excluded. Clinical correlation is
necessary. Further evaluation with bronchoscopy could be performed.
There is small left pleural effusion. The right lung is clear.
IMPRESSION: 1. As noted on chest x-ray there is masslike consolidation in
lingula. Given the air bronchogram superomedial aspect this is
suspicious for infiltrate/pneumonia. Underlying mass cannot be
entirely excluded. Clinical correlation is necessary. Further
correlation with bronchoscopy or follow-up CT scan after 10-12 days
of treatment could be performed as clinically warranted. Small left
pleural effusion.
2. Compression fractures of T5 and T12 vertebral bodies of
indeterminate age. Clinical correlation is necessary.
3. Right lung is clear.  No mediastinal or hilar adenopathy.
4. Small hiatal hernia.

## 2014-12-11 NOTE — Telephone Encounter (Signed)
[place] am ordering a stat lab for that  Please let the lab (Terri or [facility] Boss) know when she is coming in

## 2014-12-11 NOTE — Telephone Encounter (Signed)
Dr. Glori Bickers, Please order STAT Bun/Cre labs for pt. She is going now to [facility] for CT scan.

## 2014-12-11 NOTE — Addendum Note (Signed)
Addended by: Loura Pardon A on: 12/11/2014 01:46 PM   Modules accepted: Orders

## 2014-12-11 NOTE — Telephone Encounter (Signed)
I ordered the CT w/o contrast I think- does she need that? Did they change the order  I will re route to [place] and also [employer]

## 2014-12-11 NOTE — Addendum Note (Signed)
Addended by: Tammi Sou on: 12/11/2014 02:19 PM   Modules accepted: Orders

## 2014-12-11 NOTE — Telephone Encounter (Signed)
Per Ebony Hail labs not needed since pt has already had CT, labs cancelled

## 2014-12-11 NOTE — Telephone Encounter (Signed)
No they didn't change order. Anderson Malta in scheduling said since pt is over 20, even thought it is wo contrast, they still have to have labs within 6 weeks.

## 2014-12-12 MED ORDER — LEVOFLOXACIN 500 MG PO TABS: 500.0000 mg | ORAL_TABLET | Freq: Every day | ORAL | Status: DC

## 2014-12-12 NOTE — Telephone Encounter (Signed)
Spoke to her after I saw CT report and comments about not getting better Just took last azithromycin Still having sweats and has to rest after any brief activity Will add levaquin and keep her appt with me on 8/4

## 2014-12-14 ENCOUNTER — Encounter: Payer: Self-pay | Admitting: Internal Medicine

## 2014-12-14 ENCOUNTER — Ambulatory Visit (INDEPENDENT_AMBULATORY_CARE_PROVIDER_SITE_OTHER): Payer: Medicare Other | Admitting: Internal Medicine

## 2014-12-14 VITALS — BP 112/70 | HR 70 | Temp 98.3°F | Resp 12 | Wt 124.0 lb

## 2014-12-14 DIAGNOSIS — J189 Pneumonia, unspecified organism: Secondary | ICD-10-CM

## 2014-12-14 NOTE — Assessment & Plan Note (Signed)
Round pneumonia CT scan was reassuring  z-pak--but symptoms persisted so now on levaquin Seems to be clinically improved but still tired, etc Will check CXR again in about a month

## 2014-12-14 NOTE — Progress Notes (Signed)
Subjective:    Patient ID: Tanya Knight, female    DOB: 06-19-1940, 74 y.o.   MRN: [id number]  HPI Here for follow up of pneumonia Daughter Annita Brod is with her  See phone note She is on the levaquin now Coughing is better Left side soreness is better Noticed discomfort in left shoulder but "doesn't catch with every breath"  Still feels weak Gets back pain after being up for a few minutes No specific SOB  Still having sweats at night-- last night again Did have some chill  Current Outpatient Prescriptions on File Prior to Visit  Medication Sig Dispense Refill  . amLODipine (NORVASC) 2.5 MG tablet Take 1 tablet (2.5 mg total) by mouth daily. 90 tablet 3  . aspirin 81 MG tablet Take 81 mg by mouth daily.      Marland Kitchen CALCIUM PO Take by mouth.      . divalproex (DEPAKOTE) 500 MG DR tablet Take 1 tablet (500 mg total) by mouth 2 (two) times daily. 180 tablet 3  . estradiol (ESTRING) 2 MG vaginal ring Place 2 mg vaginally every 3 (three) months. follow package directions 1 each 4  . HYDROcodone-homatropine (HYCODAN) 5-1.5 MG/5ML syrup Take 5 mLs by mouth every 8 (eight) hours as needed for cough (careful of sedation). 120 mL 0  . ibandronate (BONIVA) 150 MG tablet TAKE 1 TABLET BY MOUTH EVERY MONTH IN THE MORNING WITH WATER. DO NOT LIE DOWN OR EAT FOR 30 MINUTES AFTER 3 tablet 3  . levofloxacin (LEVAQUIN) 500 MG tablet Take 1 tablet (500 mg total) by mouth daily. 10 tablet 0  . losartan (COZAAR) 100 MG tablet Take 1 tablet (100 mg total) by mouth daily. 90 tablet 3  . metoprolol succinate (TOPROL-XL) 50 MG 24 hr tablet Take 1 tablet (50 mg total) by mouth daily. Take with or immediately following a meal. 90 tablet 3  . Multiple Vitamin (MULTIVITAMIN) capsule Take 1 capsule by mouth daily.      . Multiple Vitamins-Minerals (OCUVITE PO) Take by mouth.      . Omega-3 Fatty Acids (FISH OIL PO) Take by mouth.      . Vitamin D, Ergocalciferol, (DRISDOL) 50000 UNITS CAPS capsule TAKE 1 CAPSULE  BY MOUTH EVERY 14 DAYS 10 capsule 2   No current facility-administered medications on file prior to visit.    Allergies  Allergen Reactions  . Phenytoin     Other reaction(s): Other (See Comments) Other Reaction: Other reaction REACTION: severe reaction    Past Medical History  Diagnosis Date  . GERD (gastroesophageal reflux disease)   . Osteoporosis   . Seizure disorder   . Vitamin D deficiency   . Fracture of metatarsal     Repair of fractured R metatarsal --Dr Duda---4/08  . Hypertension   . Seizures     Past Surgical History  Procedure Laterality Date  . Tonsillectomy and adenoidectomy  1948  . Foot surgery  2008  . Eye surgery      Obstructed tear duct  . Tear duct probing  10/12    temporary stent  . Cataract extraction, bilateral Bilateral 2014    Family History  Problem Relation Age of Onset  . Hypertension Mother   . Breast cancer Daughter   . Diabetes Maternal Aunt   . Coronary artery disease Paternal Aunt   . Breast cancer Paternal Aunt   . Coronary artery disease Paternal Uncle   . Heart disease Maternal Grandmother   . Heart disease Maternal Grandfather   .  Heart disease Paternal Grandmother   . Heart disease Paternal Grandfather   . Heart disease Father   . Colon cancer Neg Hx   . Stomach cancer Neg Hx   . Pancreatic cancer Neg Hx   . Rectal cancer Neg Hx     History   Social History  . Marital Status: Married    Spouse Name: N/A  . Number of Children: 3  . Years of Education: N/A   Occupational History  . retired Control and instrumentation engineer    Social History Main Topics  . Smoking status: Never Smoker   . Smokeless tobacco: Never Used  . Alcohol Use: 0.0 oz/week    0 Standard drinks or equivalent per week     Comment: occasional  . Drug Use: No  . Sexual Activity: Yes    Birth Control/ Protection: Post-menopausal   Other Topics Concern  . Not on file   Social History Narrative   Regular exercise-yes---aerobics, Pilates, jogged in  past (now walks)      Has living will   Husband, then one of her children, has health care POA   Would accept resuscitation but no prolonged artificial ventilation   Probably would not want tube feeds if cognitively unaware   Review of Systems  Appetite is still not very good No N/V No specific joint pains---other than shoulder and back Some aching in legs and feet at night--abates fairly quickly     Objective:   Physical Exam  Constitutional: She appears well-developed. No distress.  Neck: Normal range of motion. Neck supple. No thyromegaly present.  Cardiovascular: Normal rate, regular rhythm and normal heart sounds.  Exam reveals no gallop.   No murmur heard. Pulmonary/Chest: Effort normal and breath sounds normal. No respiratory distress. She has no wheezes. She has no rales.  No dullness  Lymphadenopathy:    She has no cervical adenopathy.          Assessment & Plan:

## 2014-12-14 NOTE — Progress Notes (Signed)
Pre visit review using our clinic review tool, if applicable. No additional management support is needed unless otherwise documented below in the visit note. 

## 2015-01-02 ENCOUNTER — Encounter: Payer: Self-pay | Admitting: Family Medicine

## 2015-01-02 ENCOUNTER — Ambulatory Visit (INDEPENDENT_AMBULATORY_CARE_PROVIDER_SITE_OTHER)
Admission: RE | Admit: 2015-01-02 | Discharge: 2015-01-02 | Disposition: A | Discharge status: Home / Self Care | Payer: Medicare Other | Source: Ambulatory Visit | Attending: Family Medicine | Admitting: Family Medicine

## 2015-01-02 ENCOUNTER — Ambulatory Visit (INDEPENDENT_AMBULATORY_CARE_PROVIDER_SITE_OTHER): Payer: Medicare Other | Admitting: Family Medicine

## 2015-01-02 ENCOUNTER — Telehealth: Payer: Self-pay

## 2015-01-02 VITALS — BP 106/64 | HR 64 | Temp 98.6°F | Ht 61.5 in | Wt 121.2 lb

## 2015-01-02 DIAGNOSIS — R531 Weakness: Secondary | ICD-10-CM | POA: Diagnosis not present

## 2015-01-02 DIAGNOSIS — J189 Pneumonia, unspecified organism: Secondary | ICD-10-CM

## 2015-01-02 IMAGING — CR DG CHEST 2V
2 series · 2 of 2 positions shown · non-contrast
Comparison: CT scan of the chest dated [DATE] and chest
x-ray [DATE]

CLINICAL DATA: Recent treatment for lingular pneumonia with some
improvement but now clinical deterioration. Intermittent chest pain
deep to the leftbreast for the past week, history of asthma-COPD.

EXAM:
CHEST  2 VIEW

[view not recorded (1 of 2)]
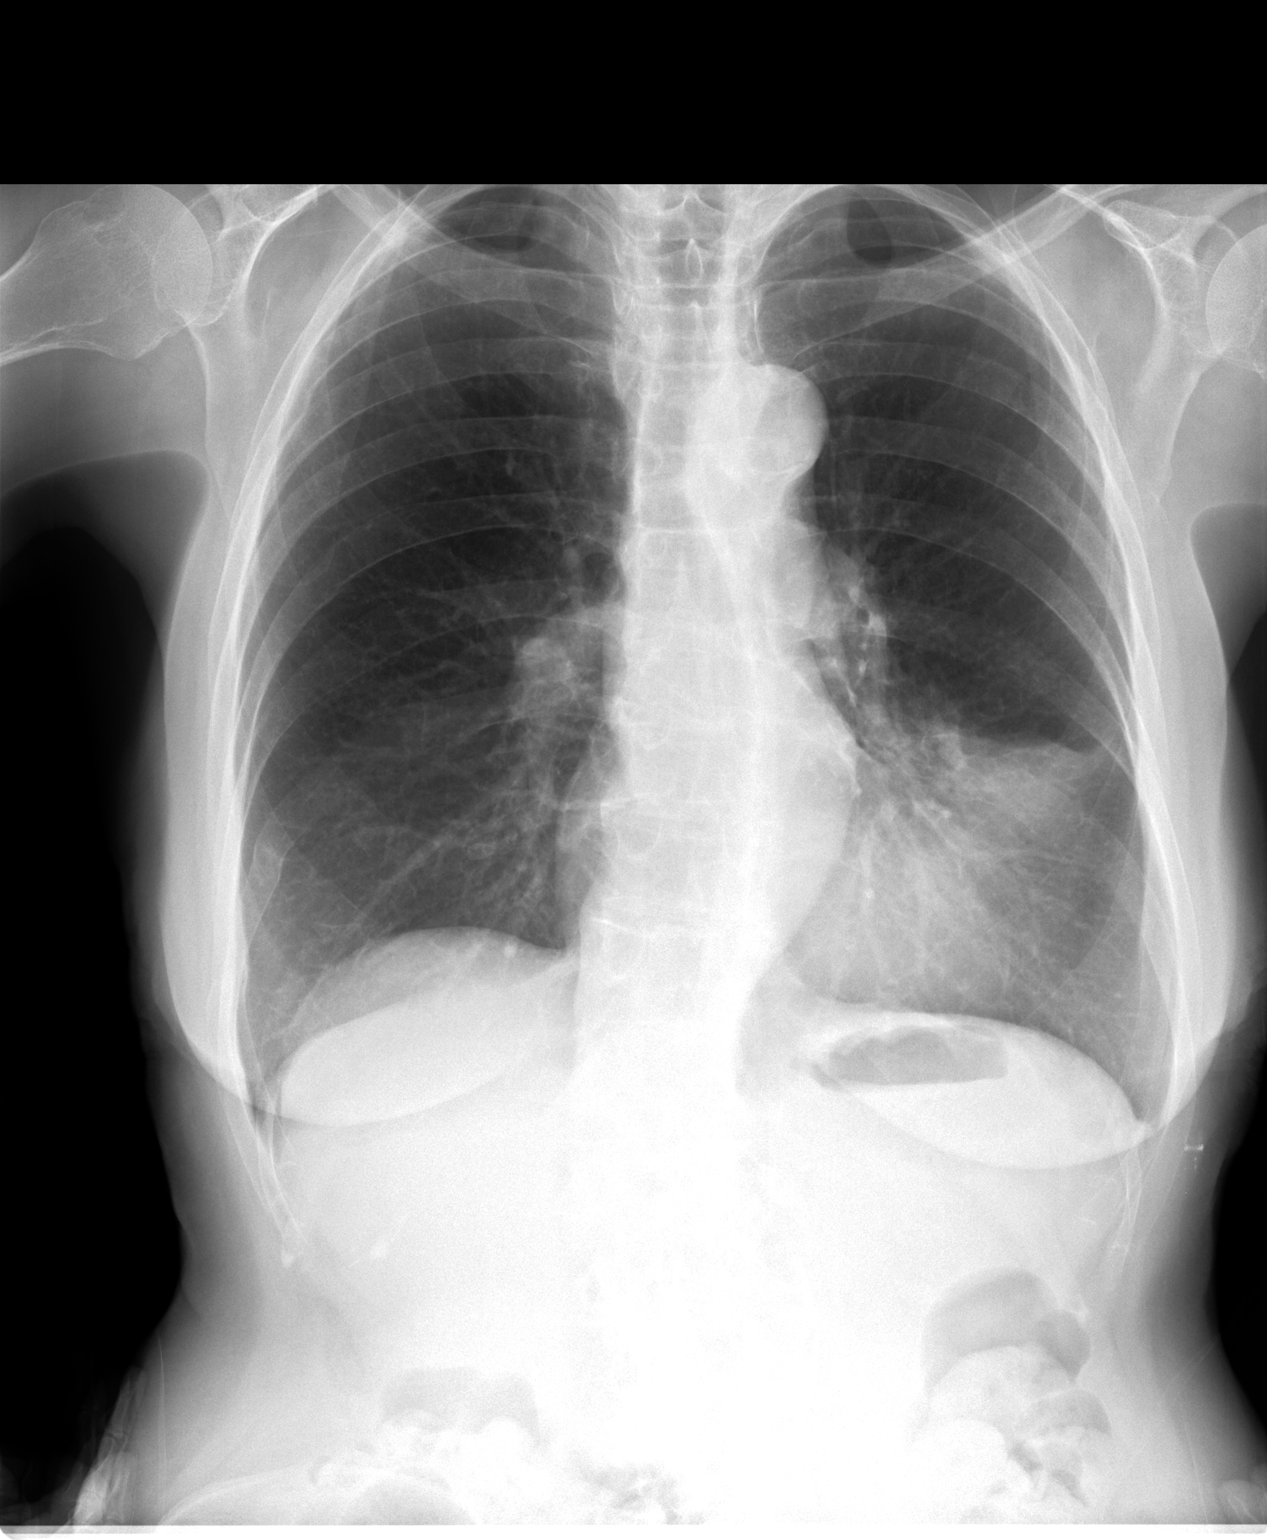

[view not recorded (2 of 2)]
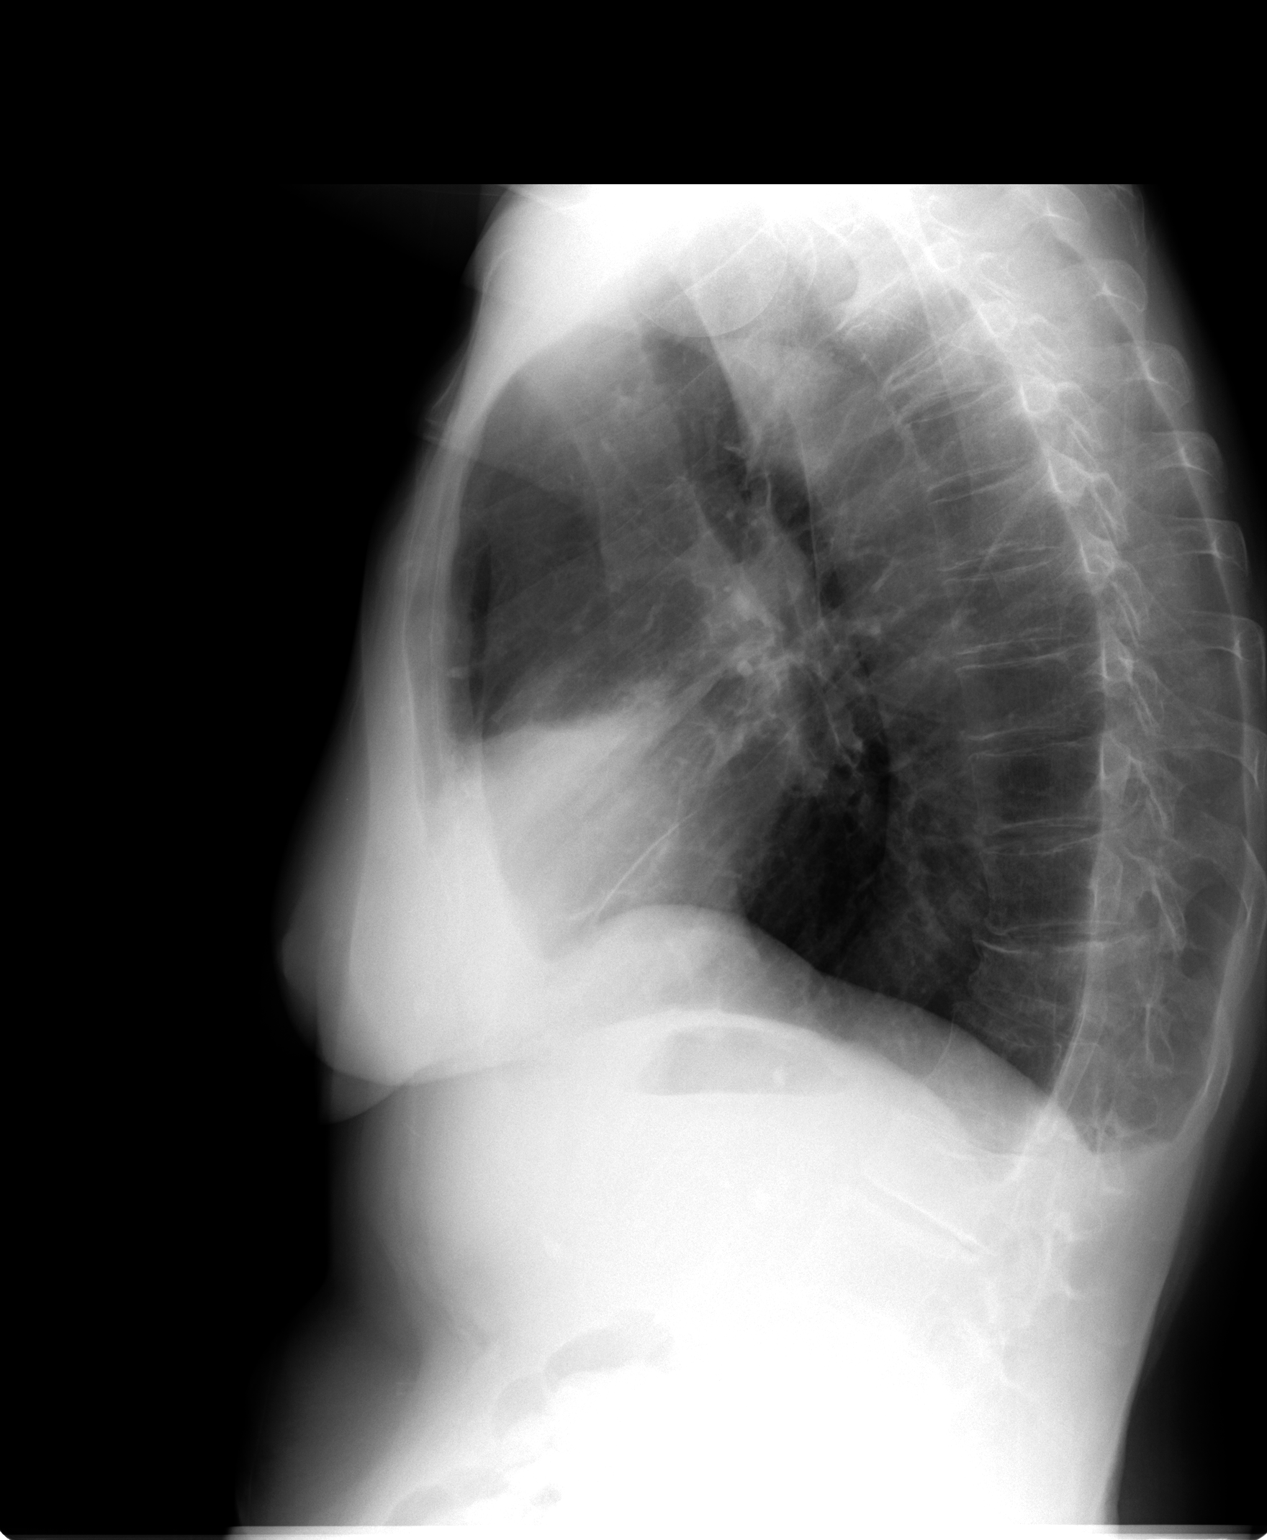

[2 of 2 positions shown; findings below may reference images not displayed]

FINDINGS: There is infiltrate similar to that seen previously. There is a
small left pleural effusion. The left heart border is obscured. The
pulmonary vascularity is normal. There is tortuosity of the
descending thoracic aorta. There is stable compression of the bodies
of T4 and T11 and L1.
IMPRESSION: Persistent or recurrent infiltrate in the lingula despite antibiotic
therapy. Follow-up chest CT scanning is recommended in an effort to
exclude an underlying mass.

## 2015-01-02 MED ORDER — LEVOFLOXACIN 500 MG PO TABS: 500.0000 mg | ORAL_TABLET | Freq: Every day | ORAL | Status: DC

## 2015-01-02 NOTE — Progress Notes (Signed)
Subjective:    Patient ID: Tanya Knight, female    DOB: 05-Mar-1941, 74 y.o.   MRN: [id number]  HPI Here for f/u of pneumonia / recently more ill   Finished abx (levaquin) 8/11 and was doing much better   CT chest CT Chest Wo Contrast   Status: Final result       PACS Images     Show images for CT Chest Wo Contrast     Study Result     CLINICAL DATA: Abnormal chest x-ray  EXAM: CT CHEST WITHOUT CONTRAST  TECHNIQUE: Multidetector CT imaging of the chest was performed following the standard protocol without IV contrast.  COMPARISON: Chest x-ray 7/29/ 16  FINDINGS: Sagittal images of the spine shows significant compression deformity of T5 vertebral body of indeterminate age. There is mild to moderate compression deformity upper endplate of U20 vertebral body of indeterminate age. Clinical correlation is necessary.  Heart size within normal limits. Atherosclerotic calcifications of thoracic aorta and coronary arteries. No pericardial effusion. Small hiatal hernia is noted. Atherosclerotic calcifications of abdominal aorta and splenic artery.  There is no mediastinal or hilar adenopathy. Axial image 33 there is masslike consolidation in lingula measures at least 5.6 x 5.5 cm. This is best visualized on coronal image 44. There is some air bronchogram medially therefore suspicious for infiltrate/ pneumonia. A underlying mass cannot be excluded. Clinical correlation is necessary. Further evaluation with bronchoscopy could be performed. There is small left pleural effusion. The right lung is clear.  IMPRESSION: 1. As noted on chest x-ray there is masslike consolidation in lingula. Given the air bronchogram superomedial aspect this is suspicious for infiltrate/pneumonia. Underlying mass cannot be entirely excluded. Clinical correlation is necessary. Further correlation with bronchoscopy or follow-up CT scan after 10-12 days of treatment could be  performed as clinically warranted. Small left pleural effusion. 2. Compression fractures of T5 and T12 vertebral bodies of indeterminate age. Clinical correlation is necessary. 3. Right lung is clear. No mediastinal or hilar adenopathy. 4. Small hiatal hernia.      Then 8/21 started having night sweats again and L shoulder pain (worse with deep breath)- and also L lower ribs  Dry/ scant cough-not bad  occ prod scant brown sputum  Last night - sweats all night -did not take her temp until the next day (nl temp)   8/22 passed out  Got up in the am - she felt "very weak"  Stood up in her kitchen - passed out - husband caught her - got her to the couch  Was unconscious for about 3 minutes  No seizure activity   She is eating and drinking ok - appetite is actually back  Is really making the effort to drink water BP Readings from Last 3 Encounters:  01/02/15 106/64  12/14/14 112/70  12/08/14 130/74      Patient Active Problem List   Diagnosis Date Noted  . Weakness 01/02/2015  . Acute bronchitis 12/08/2014  . Lingular pneumonia 12/08/2014  . Hyponatremia 09/08/2014  . Advance directive discussed with patient 09/08/2014  . Routine general medical examination at a health care facility 09/03/2011  . Essential hypertension, benign 10/12/2006  . GERD 10/12/2006  . Osteoporosis 10/12/2006  . Seizure disorder 10/12/2006   Past Medical History  Diagnosis Date  . GERD (gastroesophageal reflux disease)   . Osteoporosis   . Seizure disorder   . Vitamin D deficiency   . Fracture of metatarsal     Repair of fractured R metatarsal --  Dr Duda---4/08  . Hypertension   . Seizures    Past Surgical History  Procedure Laterality Date  . Tonsillectomy and adenoidectomy  1948  . Foot surgery  2008  . Eye surgery      Obstructed tear duct  . Tear duct probing  10/12    temporary stent  . Cataract extraction, bilateral Bilateral 2014   Social History  Substance Use Topics  .  Smoking status: Never Smoker   . Smokeless tobacco: Never Used  . Alcohol Use: 0.0 oz/week    0 Standard drinks or equivalent per week     Comment: occasional   Family History  Problem Relation Age of Onset  . Hypertension Mother   . Breast cancer Daughter   . Diabetes Maternal Aunt   . Coronary artery disease Paternal Aunt   . Breast cancer Paternal Aunt   . Coronary artery disease Paternal Uncle   . Heart disease Maternal Grandmother   . Heart disease Maternal Grandfather   . Heart disease Paternal Grandmother   . Heart disease Paternal Grandfather   . Heart disease Father   . Colon cancer Neg Hx   . Stomach cancer Neg Hx   . Pancreatic cancer Neg Hx   . Rectal cancer Neg Hx    Allergies  Allergen Reactions  . Phenytoin     Other reaction(s): Other (See Comments) Other Reaction: Other reaction REACTION: severe reaction   Current Outpatient Prescriptions on File Prior to Visit  Medication Sig Dispense Refill  . amLODipine (NORVASC) 2.5 MG tablet Take 1 tablet (2.5 mg total) by mouth daily. 90 tablet 3  . aspirin 81 MG tablet Take 81 mg by mouth daily.      Marland Kitchen CALCIUM PO Take by mouth.      . divalproex (DEPAKOTE) 500 MG DR tablet Take 1 tablet (500 mg total) by mouth 2 (two) times daily. 180 tablet 3  . estradiol (ESTRING) 2 MG vaginal ring Place 2 mg vaginally every 3 (three) months. follow package directions 1 each 4  . HYDROcodone-homatropine (HYCODAN) 5-1.5 MG/5ML syrup Take 5 mLs by mouth every 8 (eight) hours as needed for cough (careful of sedation). 120 mL 0  . ibandronate (BONIVA) 150 MG tablet TAKE 1 TABLET BY MOUTH EVERY MONTH IN THE MORNING WITH WATER. DO NOT LIE DOWN OR EAT FOR 30 MINUTES AFTER 3 tablet 3  . losartan (COZAAR) 100 MG tablet Take 1 tablet (100 mg total) by mouth daily. 90 tablet 3  . metoprolol succinate (TOPROL-XL) 50 MG 24 hr tablet Take 1 tablet (50 mg total) by mouth daily. Take with or immediately following a meal. 90 tablet 3  . Multiple  Vitamin (MULTIVITAMIN) capsule Take 1 capsule by mouth daily.      . Multiple Vitamins-Minerals (OCUVITE PO) Take by mouth.      . Omega-3 Fatty Acids (FISH OIL PO) Take by mouth.      . Vitamin D, Ergocalciferol, (DRISDOL) 50000 UNITS CAPS capsule TAKE 1 CAPSULE BY MOUTH EVERY 14 DAYS 10 capsule 2   No current facility-administered medications on file prior to visit.    Review of Systems    Review of Systems  Constitutional: Negative for fever, appetite change,  and unexpected weight change. pos for fatigue/weakness/ sweats (? If fever) Eyes: Negative for pain and visual disturbance.  ENT neg for congestion or rhinorrhea or ST Respiratory: Negative for wheeze and shortness of breath.  pos for mild dry cough  Cardiovascular: Negative for cp or  palpitations    Gastrointestinal: Negative for nausea, diarrhea and constipation.  Genitourinary: Negative for urgency and frequency.  Skin: Negative for pallor or rash   Neurological: Negative for weakness, light-headedness, numbness and headaches. Pos for 1 episode of syncope Hematological: Negative for adenopathy. Does not bruise/bleed easily.  Psychiatric/Behavioral: Negative for dysphoric mood. The patient is not nervous/anxious.      Objective:   Physical Exam  Constitutional: She appears well-developed and well-nourished. No distress.  Slim and fatigued appearing   HENT:  Head: Normocephalic and atraumatic.  Nose: Nose normal.  Mouth/Throat: Oropharynx is clear and moist. No oropharyngeal exudate.  Eyes: Conjunctivae and EOM are normal. Pupils are equal, round, and reactive to light. Right eye exhibits no discharge. Left eye exhibits no discharge. No scleral icterus.  Neck: Normal range of motion. Neck supple. No JVD present. Carotid bruit is not present. No thyromegaly present.  Cardiovascular: Normal rate, regular rhythm, normal heart sounds and intact distal pulses.  Exam reveals no gallop.   Pulmonary/Chest: Effort normal and breath  sounds normal. No respiratory distress. She has no wheezes. She has no rales. She exhibits tenderness.  No crackles  No rales heard today  L lateral CW tenderness- w/o skin change or crepitus  Abdominal: Soft. Bowel sounds are normal. She exhibits no distension, no abdominal bruit and no mass. There is no tenderness.  Musculoskeletal: She exhibits no edema.  Lymphadenopathy:    She has no cervical adenopathy.  Neurological: She is alert. She has normal reflexes. No cranial nerve deficit. She exhibits normal muscle tone. Coordination normal.  Skin: Skin is warm and dry. No rash noted. No pallor.  Psychiatric: She has a normal mood and affect.  Pleasant and talkative           Assessment & Plan:   Problem List Items Addressed This Visit    Lingular pneumonia - Primary    Return of symptoms  Round lingular infiltrate seen on CXR and CT last time with interval improvement in symptoms with levaquin (zithromax did not help) Having chest wall discomfort but not SOB Will re treat with levaquin  Repeat cxr today and repeat CT if needed (there was a question of mass vs infiltrate with last one)  Enc to contact us asap if worse symptoms (or ED if after hours)  Will also disc with PCP       Relevant Medications   levofloxacin (LEVAQUIN) 500 MG tablet   Other Relevant Orders   DG Chest 2 View (Completed)   Weakness    In pt with suspected pneumonia - who states she has always fainted easily  Reassuring exam and vitals today  Enc her to go to ED if she faints again - husband is driving her and watching her carefully  Will re star abx and enc fluids

## 2015-01-02 NOTE — Telephone Encounter (Signed)
Pt left v/m; pt was seen 12/14/14 finished abx and slowly recovered from pneumonia; on 12/31/14 pt started again with night sweats; on 01/01/15 pt passed out,pt was unconscious for approx 3 mins; had shoulder pain,more dry coughing and generally not feeling as strong. Pt was to cb with update if any symptoms reoccurred. Today pt feeling little stronger and continues with shoulder pain and dry cough. Pt would like to be rechecked today and scheduled appt with Dr Glori Bickers 01/02/15 at 12:30 PM.

## 2015-01-02 NOTE — Progress Notes (Signed)
Pre visit review using our clinic review tool, if applicable. No additional management support is needed unless otherwise documented below in the visit note. 

## 2015-01-02 NOTE — Telephone Encounter (Signed)
Thanks for seeing her

## 2015-01-02 NOTE — Telephone Encounter (Signed)
I will see her then  Also cc : PCP so he is aware

## 2015-01-02 NOTE — Patient Instructions (Addendum)
Chest xray now  We will get back to you with a result  Start levaquin again   Take it easy - if any more fainting please let us know and go to the emergency room

## 2015-01-03 ENCOUNTER — Telehealth: Payer: Self-pay | Admitting: Family Medicine

## 2015-01-03 DIAGNOSIS — J189 Pneumonia, unspecified organism: Secondary | ICD-10-CM

## 2015-01-03 NOTE — Assessment & Plan Note (Signed)
Return of symptoms  Round lingular infiltrate seen on CXR and CT last time with interval improvement in symptoms with levaquin (zithromax did not help) Having chest wall discomfort but not SOB Will re treat with levaquin  Repeat cxr today and repeat CT if needed (there was a question of mass vs infiltrate with last one)  Enc to contact us asap if worse symptoms (or ED if after hours)  Will also disc with PCP

## 2015-01-03 NOTE — Telephone Encounter (Signed)
CT order  Will route to [facility]

## 2015-01-03 NOTE — Assessment & Plan Note (Signed)
In pt with suspected pneumonia - who states she has always fainted easily  Reassuring exam and vitals today  Enc her to go to ED if she faints again - husband is driving her and watching her carefully  Will re star abx and enc fluids

## 2015-01-03 NOTE — Telephone Encounter (Signed)
-----   Message from Audree Camel, [place] sent at 01/03/2015  2:22 PM EDT ----- Notified patient. She states that she does want to proceed with the CT.

## 2015-01-04 NOTE — Telephone Encounter (Signed)
Thank you :)

## 2015-01-04 NOTE — Telephone Encounter (Signed)
CT set up at [facility] on 01/05/15 at 2pm, Call report requested!

## 2015-01-05 ENCOUNTER — Ambulatory Visit
Admission: RE | Admit: 2015-01-05 | Discharge: 2015-01-05 | Disposition: A | Discharge status: Home / Self Care | Payer: Medicare Other | Source: Ambulatory Visit | Attending: Family Medicine | Admitting: Family Medicine

## 2015-01-05 DIAGNOSIS — M4854XA Collapsed vertebra, not elsewhere classified, thoracic region, initial encounter for fracture: Secondary | ICD-10-CM | POA: Diagnosis not present

## 2015-01-05 DIAGNOSIS — J90 Pleural effusion, not elsewhere classified: Secondary | ICD-10-CM | POA: Insufficient documentation

## 2015-01-05 DIAGNOSIS — Z5189 Encounter for other specified aftercare: Secondary | ICD-10-CM | POA: Insufficient documentation

## 2015-01-05 DIAGNOSIS — I2510 Atherosclerotic heart disease of native coronary artery without angina pectoris: Secondary | ICD-10-CM | POA: Insufficient documentation

## 2015-01-05 DIAGNOSIS — J189 Pneumonia, unspecified organism: Secondary | ICD-10-CM

## 2015-01-05 IMAGING — CT CT CHEST W/O CM
1 series · 15 of 31 positions shown, 19 images · non-contrast
Comparison: [DATE]

CLINICAL DATA: Lingular density, mass versus pneumonia. Recheck.
The patient states that she felt better after a first round of
antibiotics, both 5 days ago began feeling poor again with night
sweats and chest pain. She is recently resumed a second round of
antibiotics.

EXAM:
CT CHEST WITHOUT CONTRAST
TECHNIQUE: Multidetector CT imaging of the chest was performed following the
standard protocol without IV contrast.

[Series 2: routine chest wo · axial · 0.62mm/px · z∈[-362,-88]mm · 15 of 61 slices shown, 19 images]
[im 3/61  mediastinal]
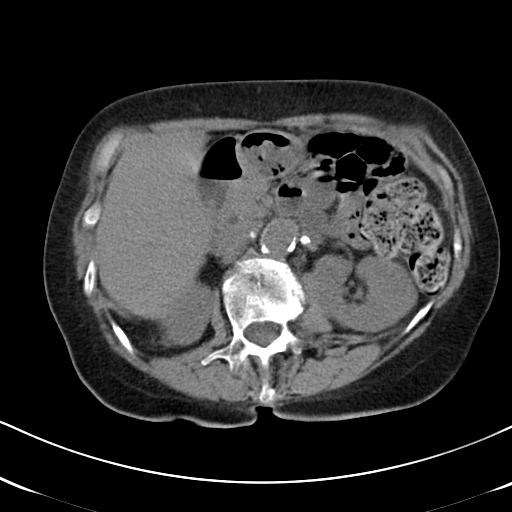
[im 3/61  lung]
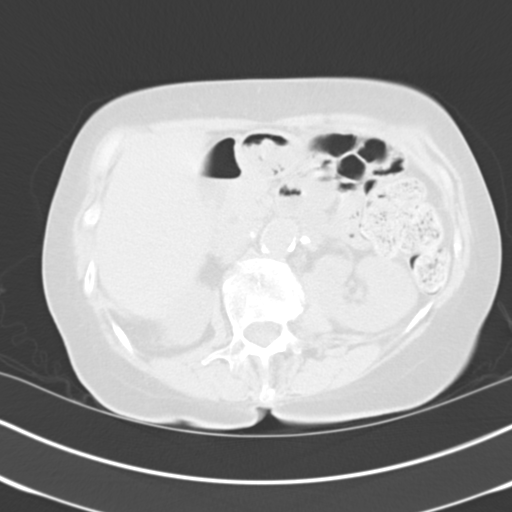
[im 7/61  lung]
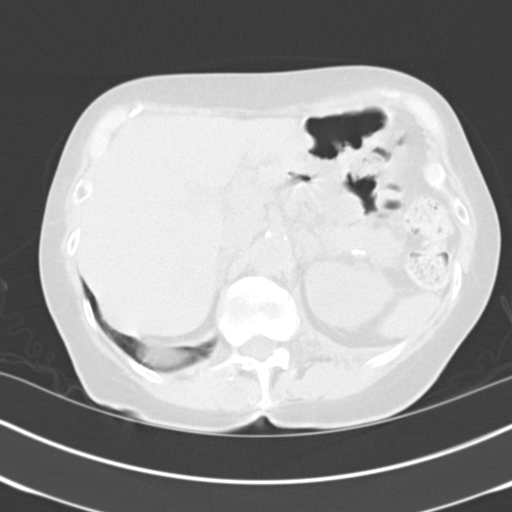
[im 12/61  lung]
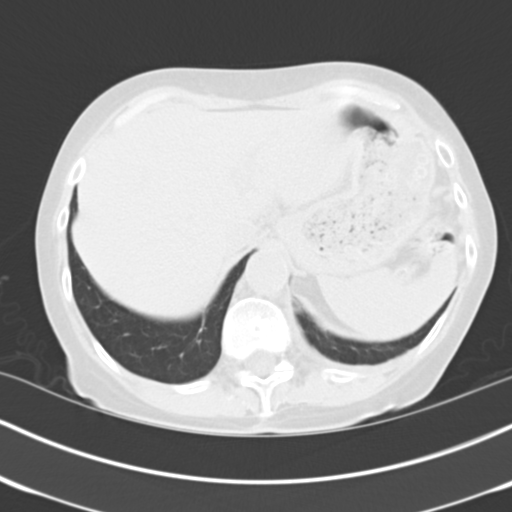
[im 14/61  lung]
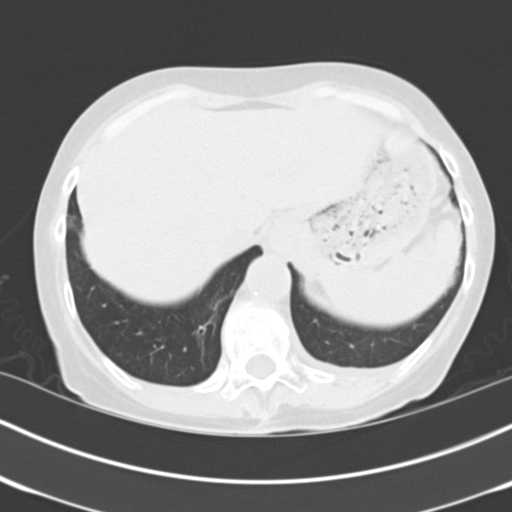
[im 18/61  mediastinal]
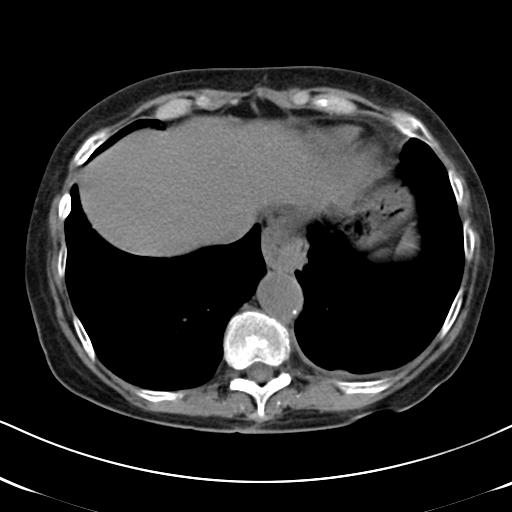
[im 18/61  lung]
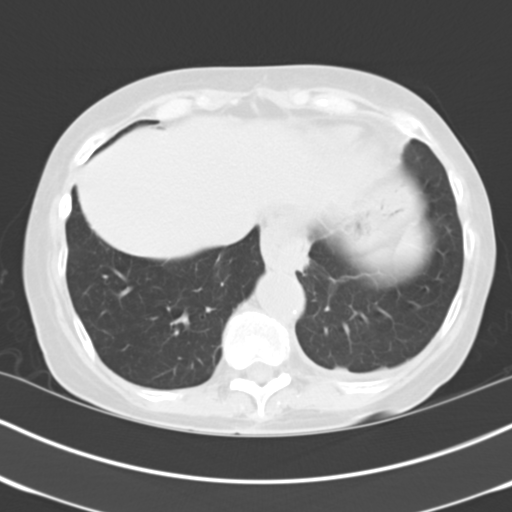
[im 23/61  lung]
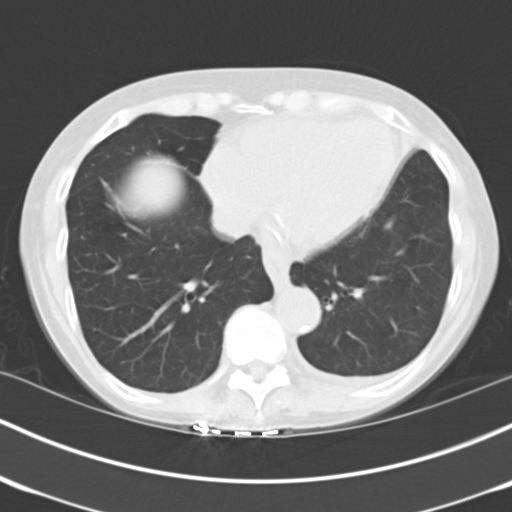
[im 27/61  lung]
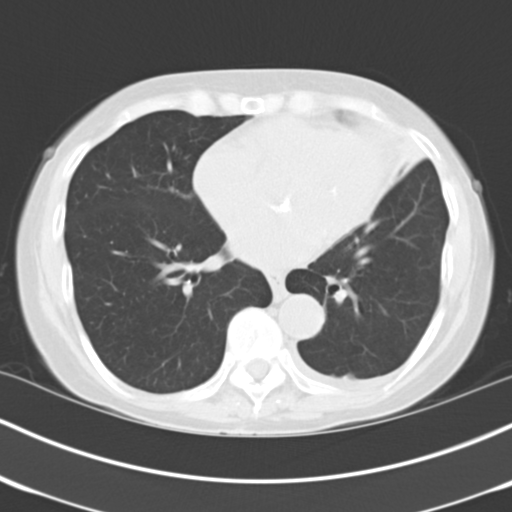
[im 32/61  lung]
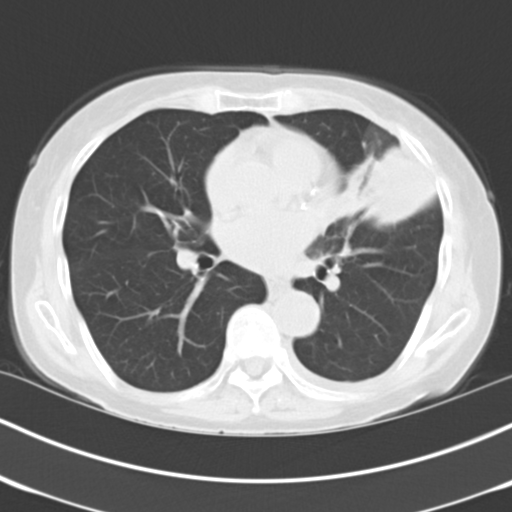
[im 34/61  mediastinal]
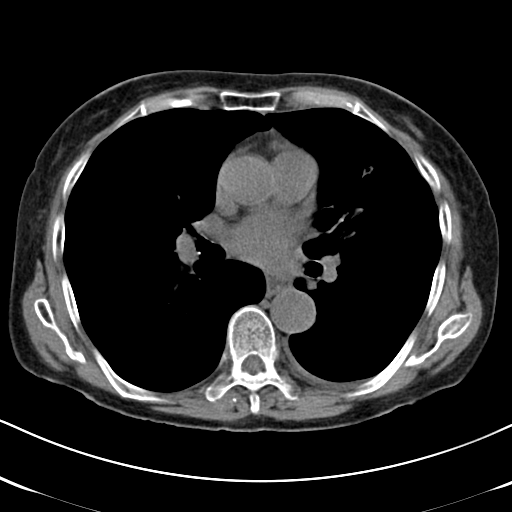
[im 34/61  lung]
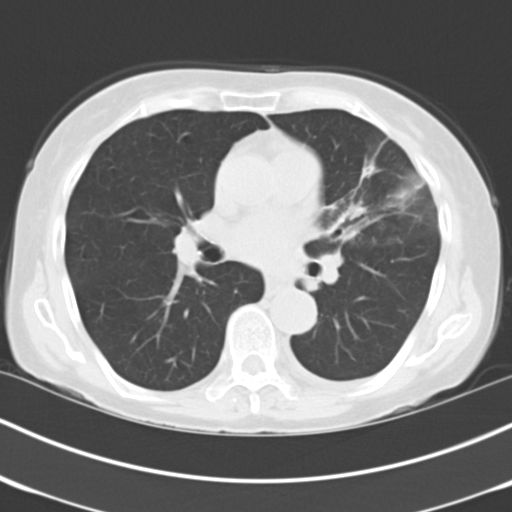
[im 37/61  lung]
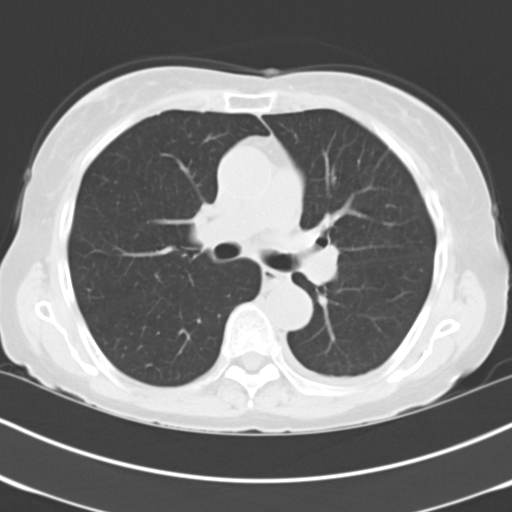
[im 41/61  lung]
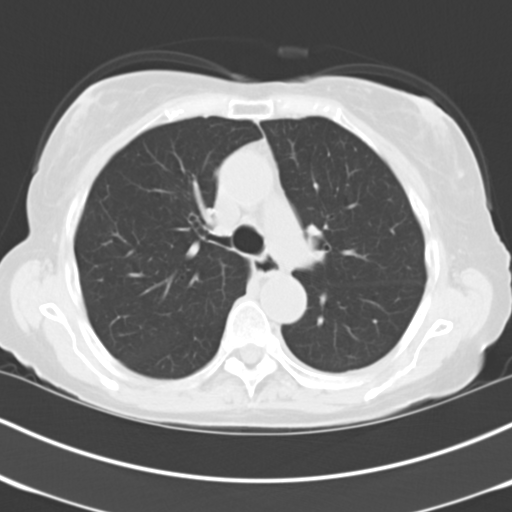
[im 45/61  lung]
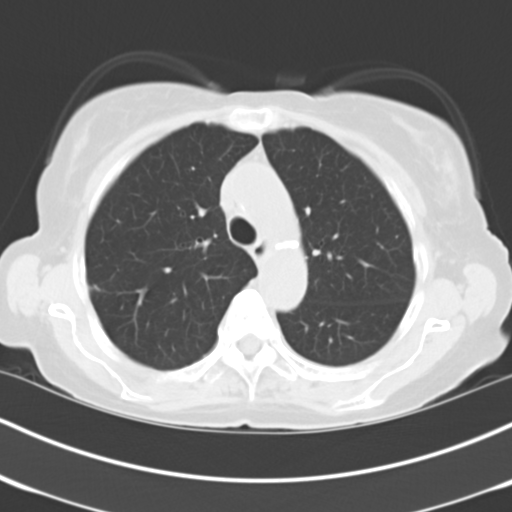
[im 49/61  mediastinal]
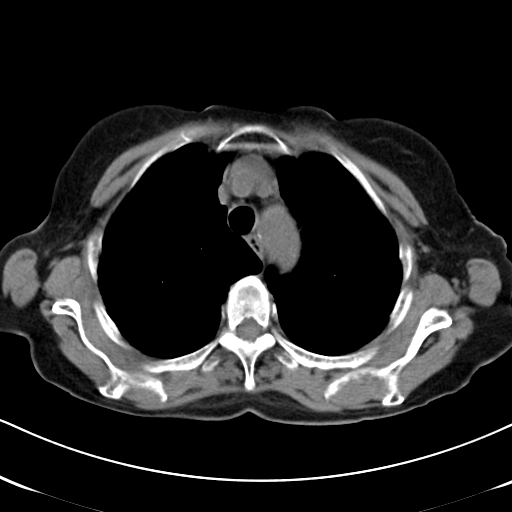
[im 49/61  lung]
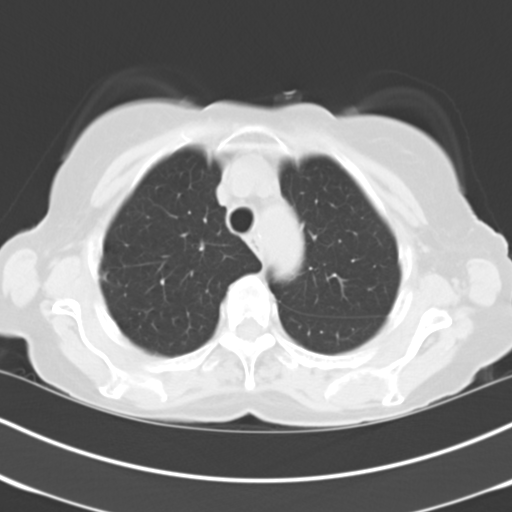
[im 54/61  lung]
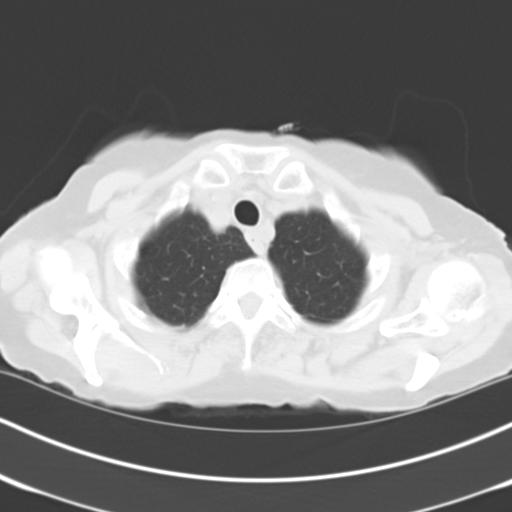
[im 58/61  lung]
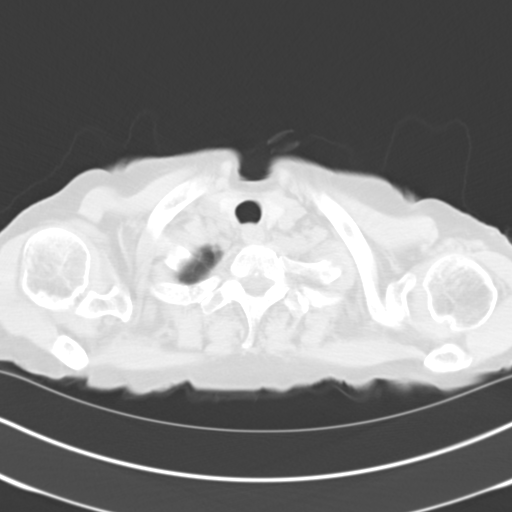

[15 of 31 positions shown; findings below may reference images not displayed]

FINDINGS: THORACIC INLET/BODY WALL:

No acute abnormality.

MEDIASTINUM:

Normal heart size. No pericardial effusion. Atherosclerosis,
including the coronary arteries. No adenopathy. Small sliding hiatal
hernia.

LUNG WINDOWS:

The triangular area of airspace opacity in the lingula has decreased
in volume/bulk. As before, air bronchograms are seen at the
periphery of the opacity primarily. No obstructing central airway
process is identified. A small left pleural effusion is decreased.

UPPER ABDOMEN:

No acute findings.

OSSEOUS:

Remote T5, T12, and L2 compression fractures.
IMPRESSION: The lingular opacity in question is favored to reflect residual
pneumonia given decreasing and recent recurrence of patient's
infectious symptoms. Close imaging follow-up is still needed to
exclude a central or obstructing mass. Would re- image in 4 weeks
now that the patient has begun a second round of antibiotics. Chest
CT may be required at follow-up, but would first obtain 2 view chest
radiograph.

## 2015-01-10 ENCOUNTER — Other Ambulatory Visit: Payer: Medicare Other

## 2015-01-10 ENCOUNTER — Other Ambulatory Visit (INDEPENDENT_AMBULATORY_CARE_PROVIDER_SITE_OTHER): Payer: Medicare Other

## 2015-01-10 DIAGNOSIS — Z1211 Encounter for screening for malignant neoplasm of colon: Secondary | ICD-10-CM

## 2015-01-11 ENCOUNTER — Encounter: Payer: Self-pay | Admitting: Internal Medicine

## 2015-01-11 ENCOUNTER — Ambulatory Visit (INDEPENDENT_AMBULATORY_CARE_PROVIDER_SITE_OTHER): Payer: Medicare Other | Admitting: Internal Medicine

## 2015-01-11 VITALS — BP 120/80 | HR 61 | Temp 97.6°F | Resp 12 | Wt 121.0 lb

## 2015-01-11 DIAGNOSIS — J189 Pneumonia, unspecified organism: Secondary | ICD-10-CM | POA: Diagnosis not present

## 2015-01-11 MED ORDER — LEVOFLOXACIN 500 MG PO TABS: 500.0000 mg | ORAL_TABLET | Freq: Every day | ORAL | Status: DC

## 2015-01-11 NOTE — Assessment & Plan Note (Signed)
Recurrent but fortunately no mass by CT scan Clinically better but that happened last time also Will extend levaquin another 10 days and recheck in 1 month

## 2015-01-11 NOTE — Progress Notes (Signed)
Subjective:    Patient ID: Tanya Knight, female    DOB: 12-Nov-1940, 74 y.o.   MRN: [id number]  HPI Here for follow up of pneumonia  Had initial treatment on 8/2 Finished this 8/11 and felt good until 8/21 Sweats, pain in arm went away  PM 8/21---restarted with pleuritic chest and arm pain Weakness and dyspnea Then passed out the next day  Now finished with second course of levaquin Seems to have improved--pain, weakness are better  Current Outpatient Prescriptions on File Prior to Visit  Medication Sig Dispense Refill  . amLODipine (NORVASC) 2.5 MG tablet Take 1 tablet (2.5 mg total) by mouth daily. 90 tablet 3  . aspirin 81 MG tablet Take 81 mg by mouth daily.      Marland Kitchen CALCIUM PO Take by mouth.      . divalproex (DEPAKOTE) 500 MG DR tablet Take 1 tablet (500 mg total) by mouth 2 (two) times daily. 180 tablet 3  . estradiol (ESTRING) 2 MG vaginal ring Place 2 mg vaginally every 3 (three) months. follow package directions 1 each 4  . ibandronate (BONIVA) 150 MG tablet TAKE 1 TABLET BY MOUTH EVERY MONTH IN THE MORNING WITH WATER. DO NOT LIE DOWN OR EAT FOR 30 MINUTES AFTER 3 tablet 3  . losartan (COZAAR) 100 MG tablet Take 1 tablet (100 mg total) by mouth daily. 90 tablet 3  . metoprolol succinate (TOPROL-XL) 50 MG 24 hr tablet Take 1 tablet (50 mg total) by mouth daily. Take with or immediately following a meal. 90 tablet 3  . Multiple Vitamin (MULTIVITAMIN) capsule Take 1 capsule by mouth daily.      . Multiple Vitamins-Minerals (OCUVITE PO) Take by mouth.      . Omega-3 Fatty Acids (FISH OIL PO) Take by mouth.      . Vitamin D, Ergocalciferol, (DRISDOL) 50000 UNITS CAPS capsule TAKE 1 CAPSULE BY MOUTH EVERY 14 DAYS 10 capsule 2   No current facility-administered medications on file prior to visit.    Allergies  Allergen Reactions  . Phenytoin     Other reaction(s): Other (See Comments) Other Reaction: Other reaction REACTION: severe reaction    Past Medical History    Diagnosis Date  . GERD (gastroesophageal reflux disease)   . Osteoporosis   . Seizure disorder   . Vitamin D deficiency   . Fracture of metatarsal     Repair of fractured R metatarsal --Dr Duda---4/08  . Hypertension   . Seizures     Past Surgical History  Procedure Laterality Date  . Tonsillectomy and adenoidectomy  1948  . Foot surgery  2008  . Eye surgery      Obstructed tear duct  . Tear duct probing  10/12    temporary stent  . Cataract extraction, bilateral Bilateral 2014    Family History  Problem Relation Age of Onset  . Hypertension Mother   . Breast cancer Daughter   . Diabetes Maternal Aunt   . Coronary artery disease Paternal Aunt   . Breast cancer Paternal Aunt   . Coronary artery disease Paternal Uncle   . Heart disease Maternal Grandmother   . Heart disease Maternal Grandfather   . Heart disease Paternal Grandmother   . Heart disease Paternal Grandfather   . Heart disease Father   . Colon cancer Neg Hx   . Stomach cancer Neg Hx   . Pancreatic cancer Neg Hx   . Rectal cancer Neg Hx     Social History  Social History  . Marital Status: Married    Spouse Name: N/A  . Number of Children: 3  . Years of Education: N/A   Occupational History  . retired Control and instrumentation engineer    Social History Main Topics  . Smoking status: Never Smoker   . Smokeless tobacco: Never Used  . Alcohol Use: 0.0 oz/week    0 Standard drinks or equivalent per week     Comment: occasional  . Drug Use: No  . Sexual Activity: Yes    Birth Control/ Protection: Post-menopausal   Other Topics Concern  . Not on file   Social History Narrative   Regular exercise-yes---aerobics, Pilates, jogged in past (now walks)      Has living will   Husband, then one of her children, has health care POA   Would accept resuscitation but no prolonged artificial ventilation   Probably would not want tube feeds if cognitively unaware   Review of Systems  Trying to take it easy Appetite  is fine No diarrhea     Objective:   Physical Exam  Constitutional: She appears well-developed and well-nourished. No distress.  Pulmonary/Chest: Effort normal and breath sounds normal. No respiratory distress. She has no wheezes. She has no rales.          Assessment & Plan:

## 2015-01-11 NOTE — Progress Notes (Signed)
Pre visit review using our clinic review tool, if applicable. No additional management support is needed unless otherwise documented below in the visit note. 

## 2015-01-12 LAB — FECAL OCCULT BLOOD, IMMUNOCHEMICAL: FECAL OCCULT BLD: NEGATIVE

## 2015-01-16 ENCOUNTER — Encounter: Payer: Self-pay | Admitting: *Deleted

## 2015-02-12 ENCOUNTER — Ambulatory Visit (INDEPENDENT_AMBULATORY_CARE_PROVIDER_SITE_OTHER): Payer: Medicare Other | Admitting: Internal Medicine

## 2015-02-12 ENCOUNTER — Ambulatory Visit (INDEPENDENT_AMBULATORY_CARE_PROVIDER_SITE_OTHER)
Admission: RE | Admit: 2015-02-12 | Discharge: 2015-02-12 | Disposition: A | Discharge status: Home / Self Care | Payer: Medicare Other | Source: Ambulatory Visit | Attending: Internal Medicine | Admitting: Internal Medicine

## 2015-02-12 ENCOUNTER — Encounter: Payer: Self-pay | Admitting: Internal Medicine

## 2015-02-12 VITALS — BP 138/70 | HR 56 | Temp 97.4°F | Wt 128.0 lb

## 2015-02-12 DIAGNOSIS — J181 Lobar pneumonia, unspecified organism: Secondary | ICD-10-CM | POA: Diagnosis not present

## 2015-02-12 IMAGING — CR DG CHEST 2V
2 series · 2 of 2 positions shown · non-contrast
Comparison: [DATE].  Chest CT [DATE].

CLINICAL DATA: Follow-up lingular pneumonia.

EXAM:
CHEST  2 VIEW

[view not recorded (1 of 2)]
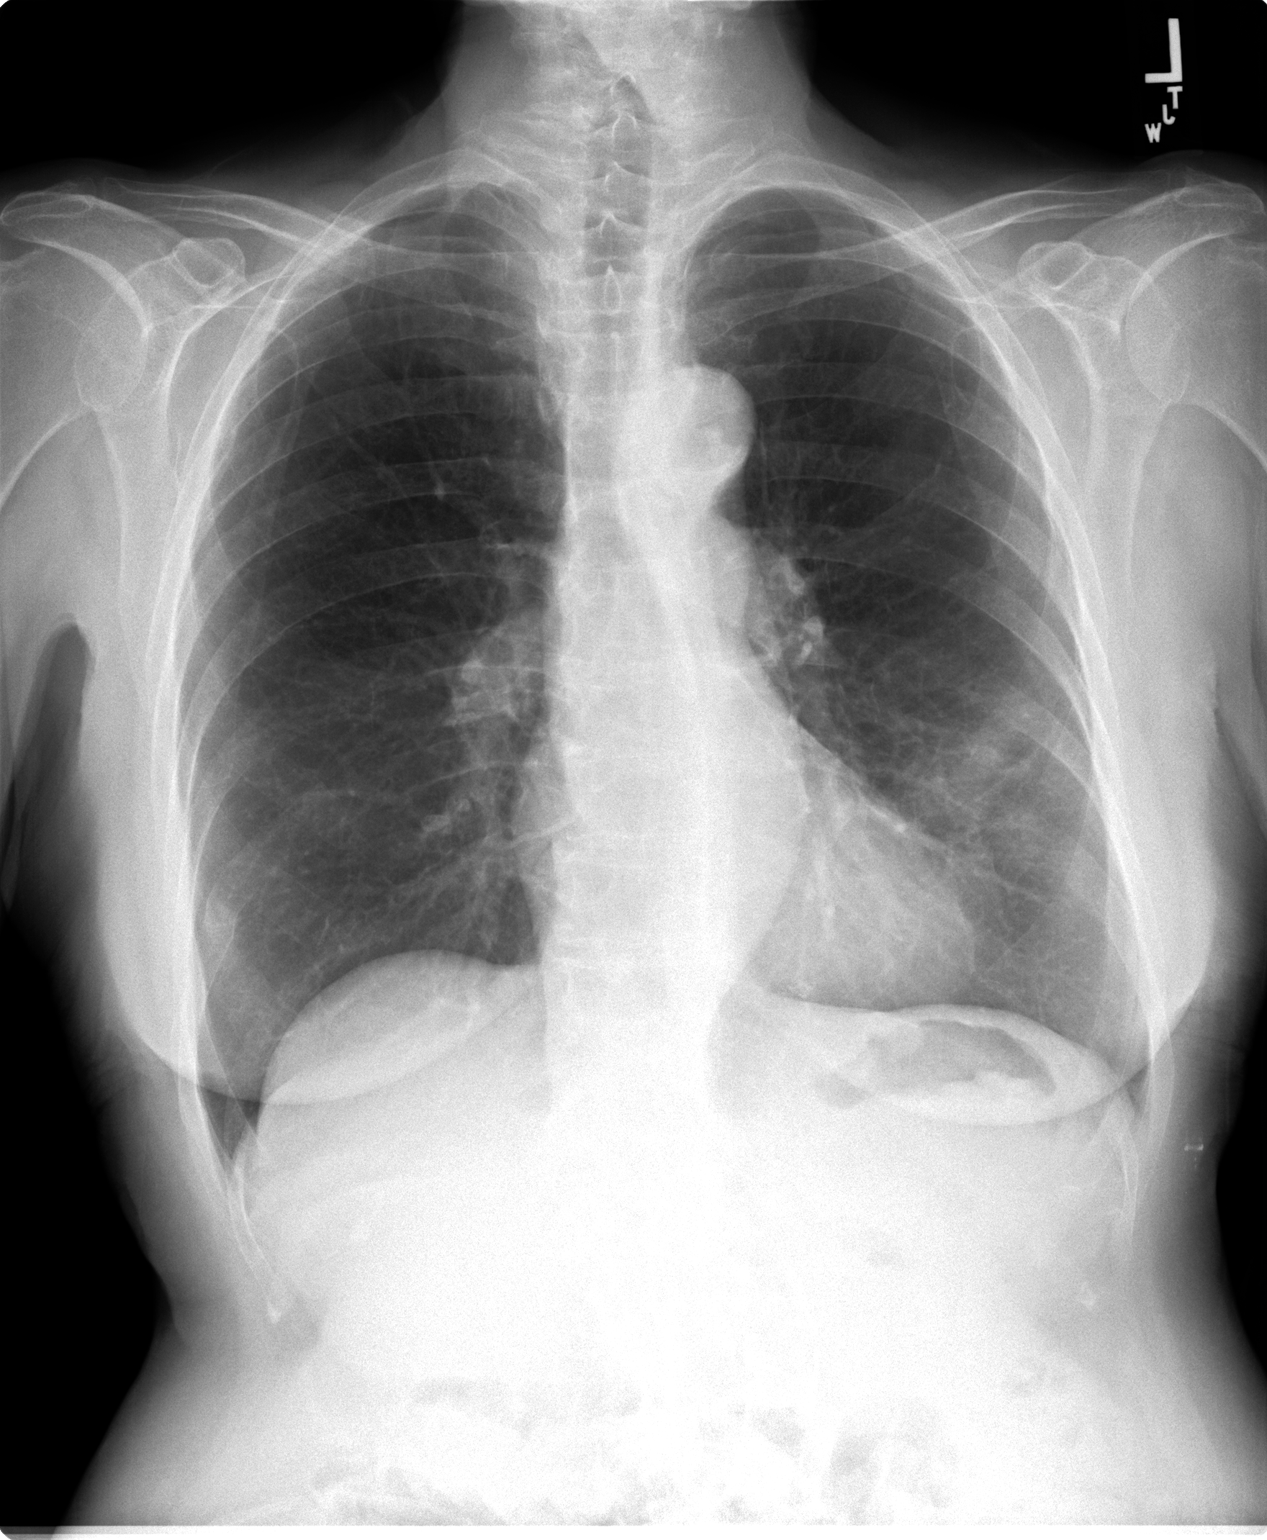

[view not recorded (2 of 2)]
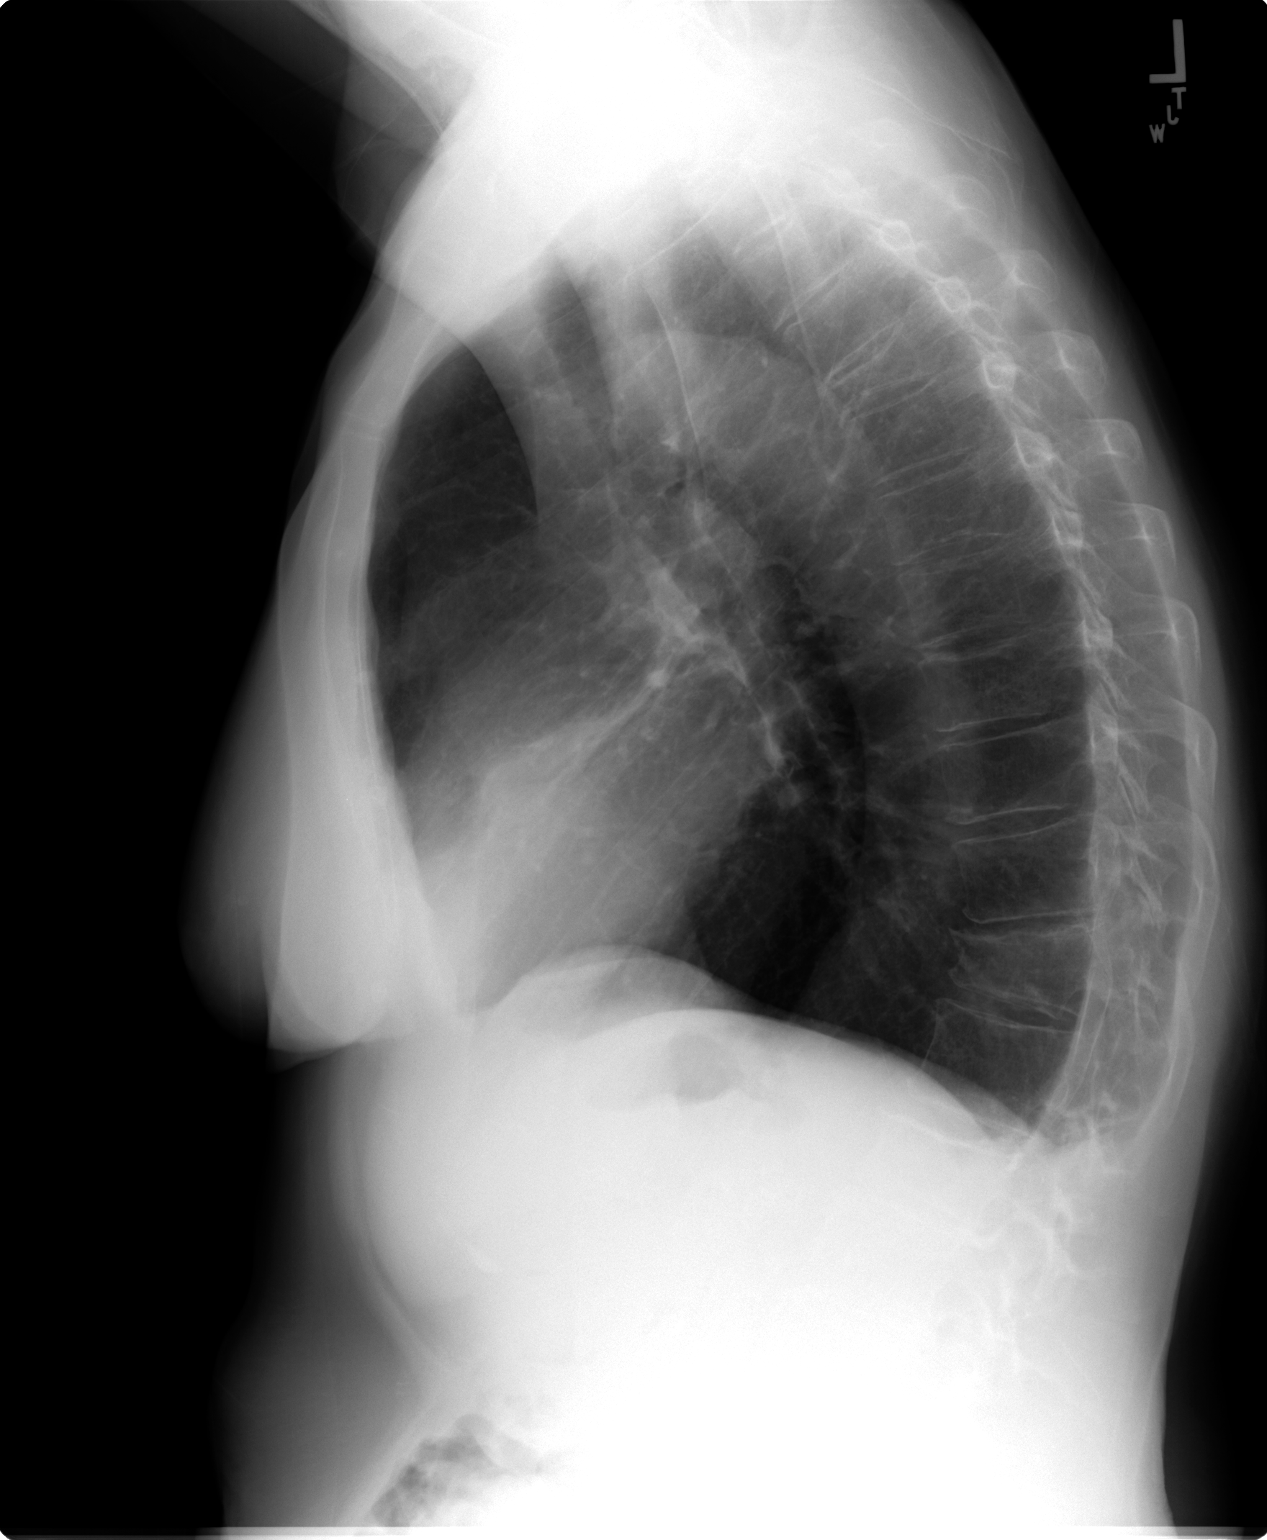

[2 of 2 positions shown; findings below may reference images not displayed]

FINDINGS: Improving density within the lingula. Mild residual opacity for
cysts. Continued follow-up recommended. Right lung is clear. No
effusions. Heart is normal size. Tortuosity of the thoracic aorta.
No acute bony abnormality. Stable mild compression fracture in the
lower thoracic spine.
IMPRESSION: Improving lingular opacity, but mild airspace disease persists.
Recommend continued follow-up with repeat chest x-ray in 4-6 weeks.

## 2015-02-12 NOTE — Progress Notes (Signed)
Subjective:    Patient ID: Tanya Knight, female    DOB: [birth year], 74 y.o.   MRN: [id number]  HPI Here for follow up of lingular pneumonia Did respond--then relapsed  Off the medication for 3 weeks Feels finally completely back to normal--but hasn't gotten back to her normal exercise Walking but not her aerobic classes  No cough---even with exercise No SOB  Current Outpatient Prescriptions on File Prior to Visit  Medication Sig Dispense Refill  . amLODipine (NORVASC) 2.5 MG tablet Take 1 tablet (2.5 mg total) by mouth daily. 90 tablet 3  . aspirin 81 MG tablet Take 81 mg by mouth daily.      Marland Kitchen CALCIUM PO Take by mouth.      . divalproex (DEPAKOTE) 500 MG DR tablet Take 1 tablet (500 mg total) by mouth 2 (two) times daily. 180 tablet 3  . estradiol (ESTRING) 2 MG vaginal ring Place 2 mg vaginally every 3 (three) months. follow package directions 1 each 4  . ibandronate (BONIVA) 150 MG tablet TAKE 1 TABLET BY MOUTH EVERY MONTH IN THE MORNING WITH WATER. DO NOT LIE DOWN OR EAT FOR 30 MINUTES AFTER 3 tablet 3  . losartan (COZAAR) 100 MG tablet Take 1 tablet (100 mg total) by mouth daily. 90 tablet 3  . metoprolol succinate (TOPROL-XL) 50 MG 24 hr tablet Take 1 tablet (50 mg total) by mouth daily. Take with or immediately following a meal. 90 tablet 3  . Multiple Vitamin (MULTIVITAMIN) capsule Take 1 capsule by mouth daily.      . Multiple Vitamins-Minerals (OCUVITE PO) Take by mouth.      . Omega-3 Fatty Acids (FISH OIL PO) Take by mouth.      . Vitamin D, Ergocalciferol, (DRISDOL) 50000 UNITS CAPS capsule TAKE 1 CAPSULE BY MOUTH EVERY 14 DAYS 10 capsule 2   No current facility-administered medications on file prior to visit.    Allergies  Allergen Reactions  . Phenytoin     Other reaction(s): Other (See Comments) Other Reaction: Other reaction REACTION: severe reaction    Past Medical History  Diagnosis Date  . GERD (gastroesophageal reflux disease)   . Osteoporosis     . Seizure disorder ([place])   . Vitamin D deficiency   . Fracture of metatarsal     Repair of fractured R metatarsal --Dr Duda---4/08  . Hypertension   . Seizures [facility])     Past Surgical History  Procedure Laterality Date  . Tonsillectomy and adenoidectomy  1948  . Foot surgery  2008  . Eye surgery      Obstructed tear duct  . Tear duct probing  10/12    temporary stent  . Cataract extraction, bilateral Bilateral 2014    Family History  Problem Relation Age of Onset  . Hypertension Mother   . Breast cancer Daughter   . Diabetes Maternal Aunt   . Coronary artery disease Paternal Aunt   . Breast cancer Paternal Aunt   . Coronary artery disease Paternal Uncle   . Heart disease Maternal Grandmother   . Heart disease Maternal Grandfather   . Heart disease Paternal Grandmother   . Heart disease Paternal Grandfather   . Heart disease Father   . Colon cancer Neg Hx   . Stomach cancer Neg Hx   . Pancreatic cancer Neg Hx   . Rectal cancer Neg Hx     Social History   Social History  . Marital Status: Married    Spouse Name: N/A  .  Number of Children: 3  . Years of Education: N/A   Occupational History  . retired Control and instrumentation engineer    Social History Main Topics  . Smoking status: Never Smoker   . Smokeless tobacco: Never Used  . Alcohol Use: 0.0 oz/week    0 Standard drinks or equivalent per week     Comment: occasional  . Drug Use: No  . Sexual Activity: Yes    Birth Control/ Protection: Post-menopausal   Other Topics Concern  . Not on file   Social History Narrative   Regular exercise-yes---aerobics, Pilates, jogged in past (now walks)      Has living will   Husband, then one of her children, has health care POA   Would accept resuscitation but no prolonged artificial ventilation   Probably would not want tube feeds if cognitively unaware   Review of Systems Sleeps well Appetite is normal    Objective:   Physical Exam  Constitutional: She appears  well-developed and well-nourished. No distress.  Neck: Normal range of motion. Neck supple. No thyromegaly present.  Pulmonary/Chest: Effort normal and breath sounds normal. No respiratory distress. She has no wheezes. She has no rales.  Lymphadenopathy:    She has no cervical adenopathy.          Assessment & Plan:

## 2015-02-12 NOTE — Progress Notes (Signed)
Pre visit review using our clinic review tool, if applicable. No additional management support is needed unless otherwise documented below in the visit note. 

## 2015-02-12 NOTE — Assessment & Plan Note (Signed)
Persistent findings at first--requiring CT scan Clinically better Length of time suggests atypical Will recheck CXR now

## 2015-03-27 ENCOUNTER — Ambulatory Visit (INDEPENDENT_AMBULATORY_CARE_PROVIDER_SITE_OTHER)
Admission: RE | Admit: 2015-03-27 | Discharge: 2015-03-27 | Disposition: A | Discharge status: Home / Self Care | Payer: Medicare Other | Source: Ambulatory Visit | Attending: Internal Medicine | Admitting: Internal Medicine

## 2015-03-27 ENCOUNTER — Other Ambulatory Visit: Payer: Medicare Other

## 2015-03-27 DIAGNOSIS — J189 Pneumonia, unspecified organism: Secondary | ICD-10-CM | POA: Diagnosis not present

## 2015-03-27 IMAGING — CR DG CHEST 2V
2 series · 2 of 2 positions shown · non-contrast
Comparison: Chest x-ray of [DATE] and CT chest of [DATE]

CLINICAL DATA: Followup of a lingular pneumonia, smoking history,
also history of asthma

EXAM:
CHEST  2 VIEW

[view not recorded (1 of 2)]
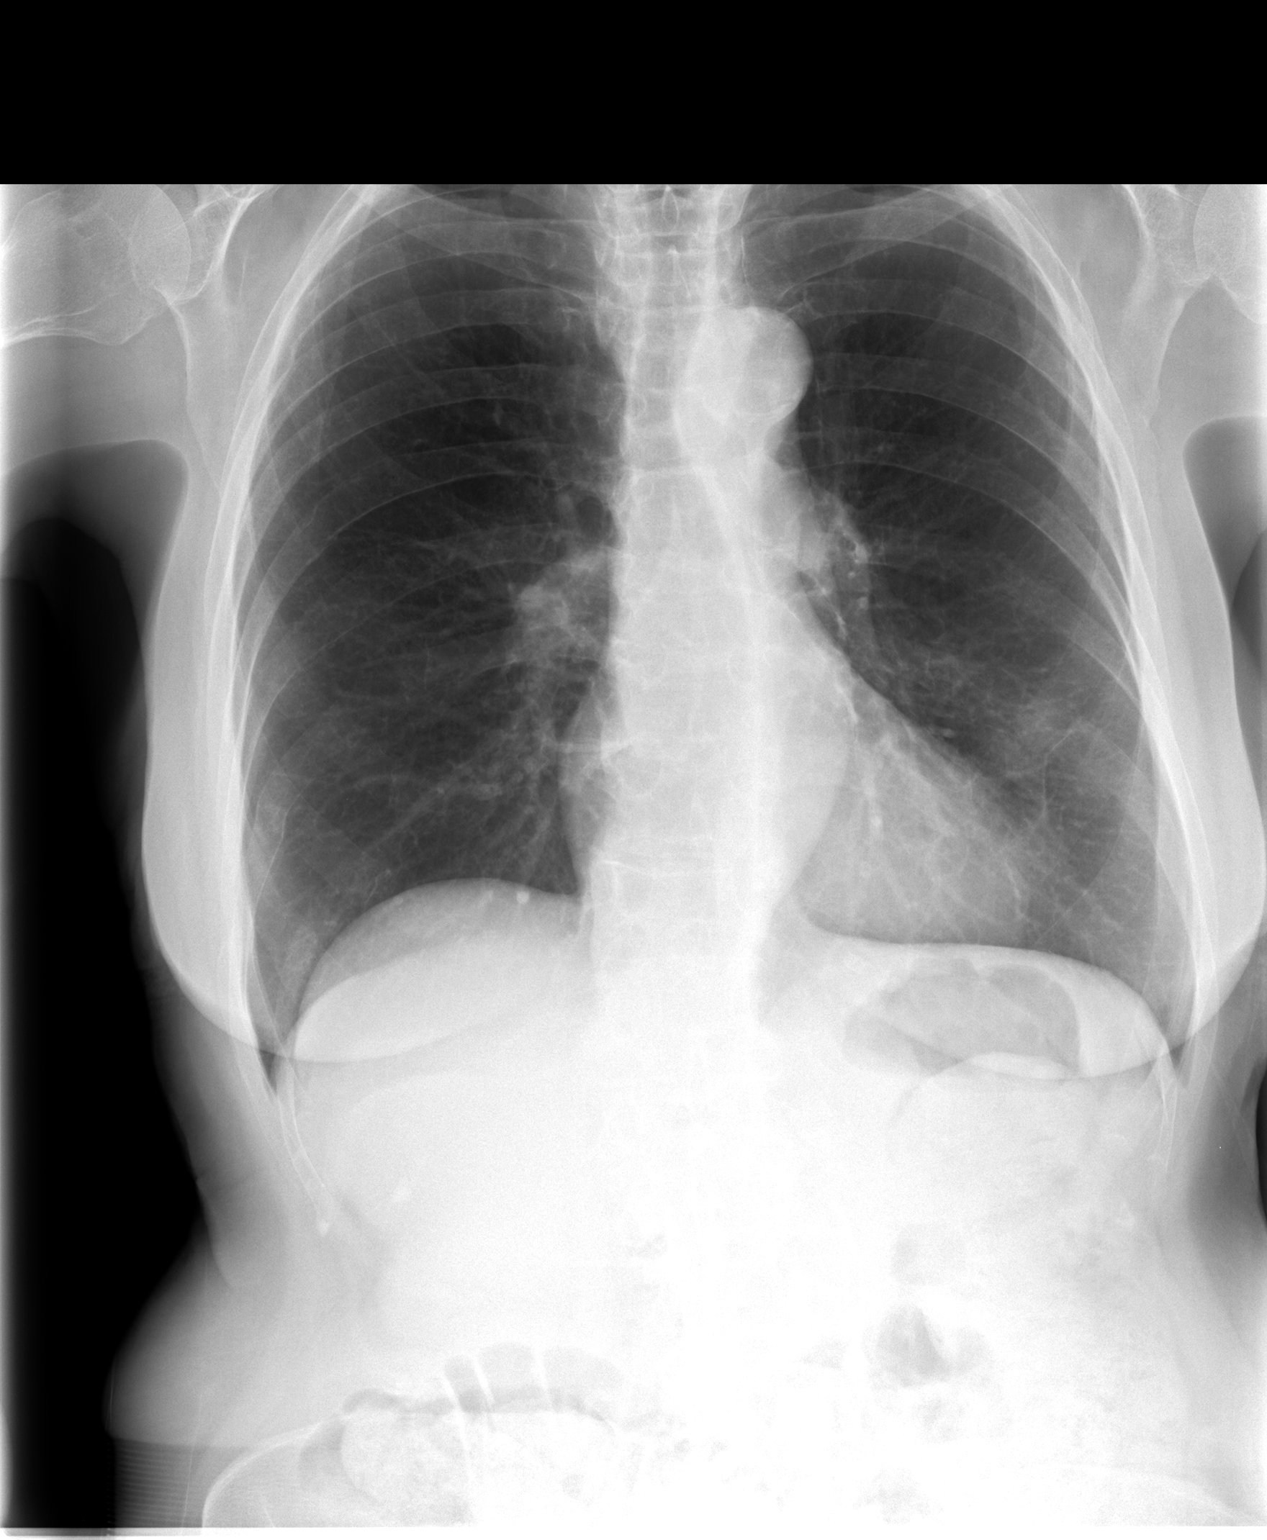

[view not recorded (2 of 2)]
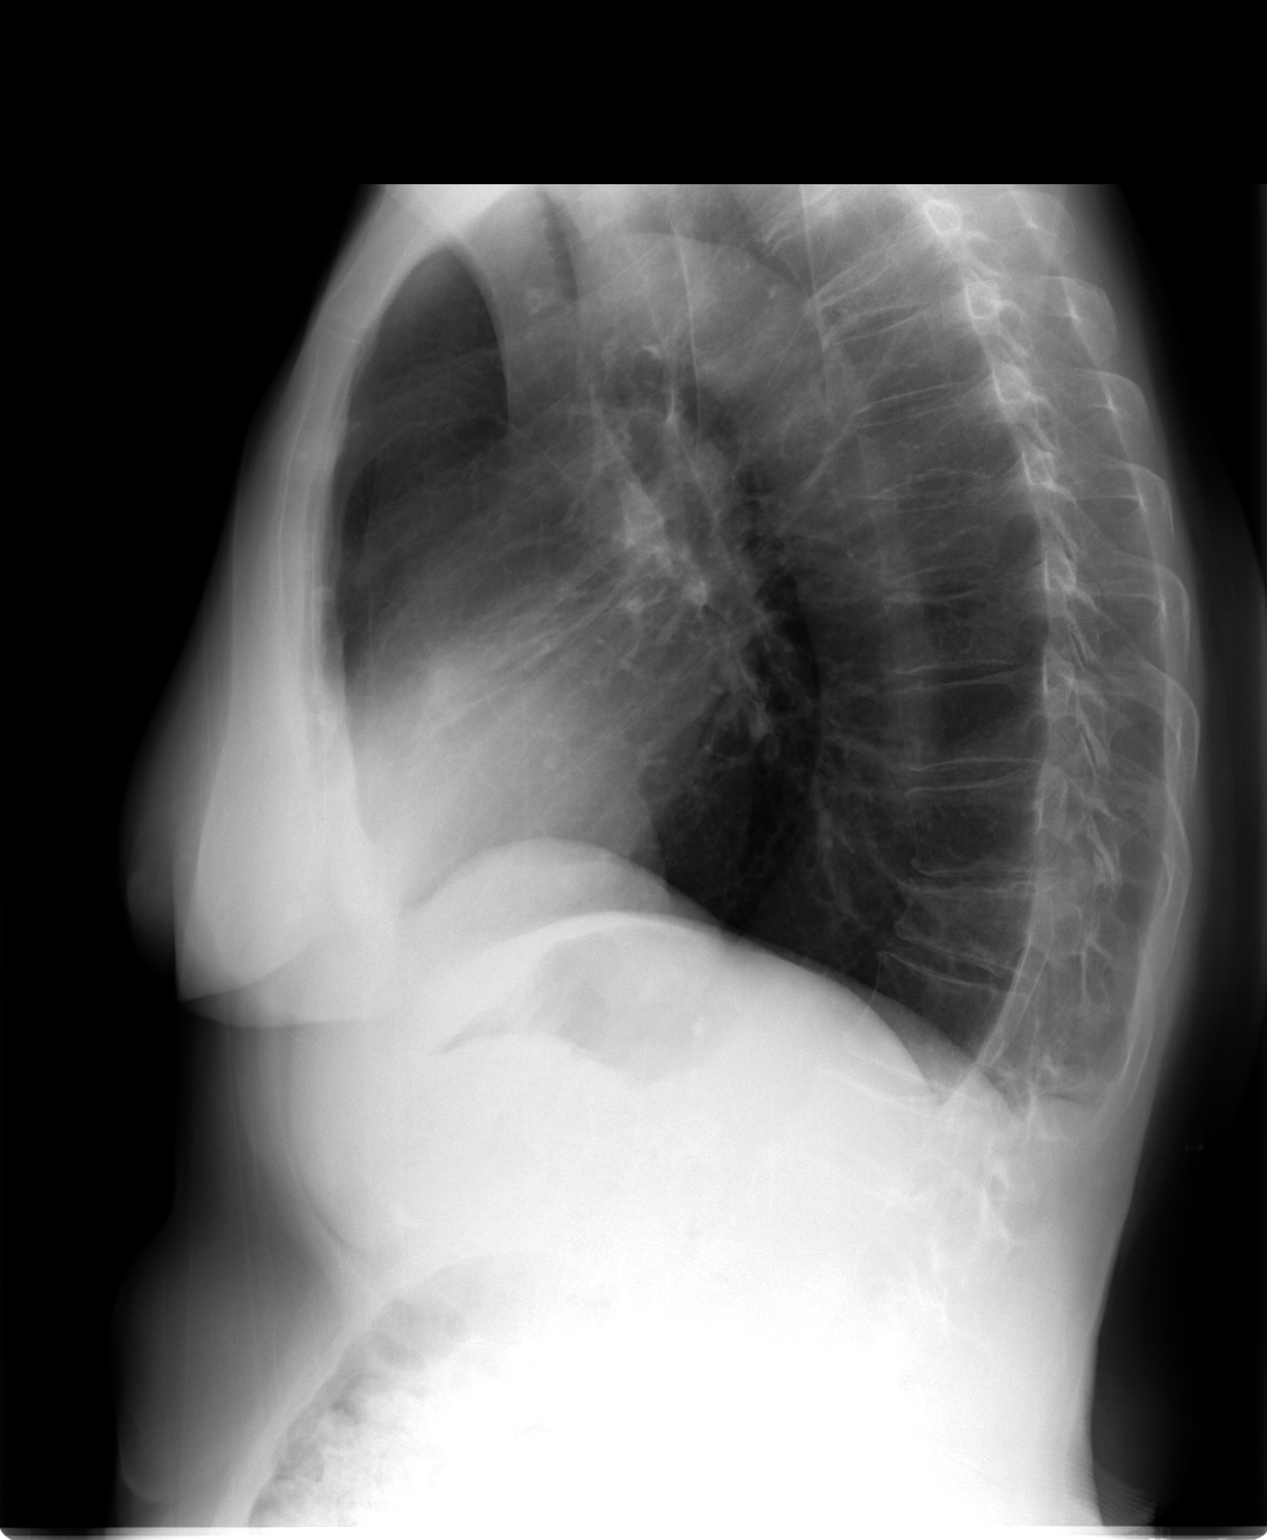

[2 of 2 positions shown; findings below may reference images not displayed]

FINDINGS: The lingular opacity has almost completely cleared with minimal
opacity remaining anteriorly on the lateral view within the lingula.
Otherwise the lungs are clear and hyperaerated. Mediastinal and
hilar contours are unremarkable and the heart is within normal
limits in size. A partially compressed lower thoracic vertebral body
as well as a compressed upper thoracic vertebral body appear stable.
IMPRESSION: 1. Almost complete clearing of lingular opacity with minimal
residual opacity.
2. Partially compressed mid and lower thoracic vertebral bodies
appear unchanged.

## 2015-03-28 ENCOUNTER — Encounter: Payer: Self-pay | Admitting: *Deleted

## 2015-03-28 ENCOUNTER — Telehealth: Payer: Self-pay | Admitting: Internal Medicine

## 2015-03-28 NOTE — Telephone Encounter (Signed)
Patient called and I let her know her x-ray results.  Patient wants to know if she can go back to all normal activities.

## 2015-03-28 NOTE — Telephone Encounter (Signed)
Spoke with patient and advised results   

## 2015-03-28 NOTE — Telephone Encounter (Signed)
Yes no restrictions.

## 2015-04-13 ENCOUNTER — Encounter: Payer: Self-pay | Admitting: *Deleted

## 2015-04-13 ENCOUNTER — Encounter: Payer: Self-pay | Admitting: Internal Medicine

## 2015-07-18 ENCOUNTER — Ambulatory Visit (INDEPENDENT_AMBULATORY_CARE_PROVIDER_SITE_OTHER)
Admission: RE | Admit: 2015-07-18 | Discharge: 2015-07-18 | Disposition: A | Discharge status: Home / Self Care | Payer: Medicare Other | Source: Ambulatory Visit | Attending: Internal Medicine | Admitting: Internal Medicine

## 2015-07-18 ENCOUNTER — Ambulatory Visit (INDEPENDENT_AMBULATORY_CARE_PROVIDER_SITE_OTHER): Payer: Medicare Other | Admitting: Internal Medicine

## 2015-07-18 ENCOUNTER — Encounter: Payer: Self-pay | Admitting: Internal Medicine

## 2015-07-18 VITALS — BP 134/80 | HR 60 | Temp 97.8°F | Resp 14 | Wt 127.5 lb

## 2015-07-18 DIAGNOSIS — R059 Cough, unspecified: Secondary | ICD-10-CM | POA: Insufficient documentation

## 2015-07-18 DIAGNOSIS — R05 Cough: Secondary | ICD-10-CM | POA: Diagnosis not present

## 2015-07-18 IMAGING — DX DG CHEST 2V
2 series · 2 of 2 positions shown · non-contrast
Comparison: [DATE]

CLINICAL DATA: recurrent cough--- pneumonia last fall

EXAM:
CHEST - 2 VIEW

[chest pa]
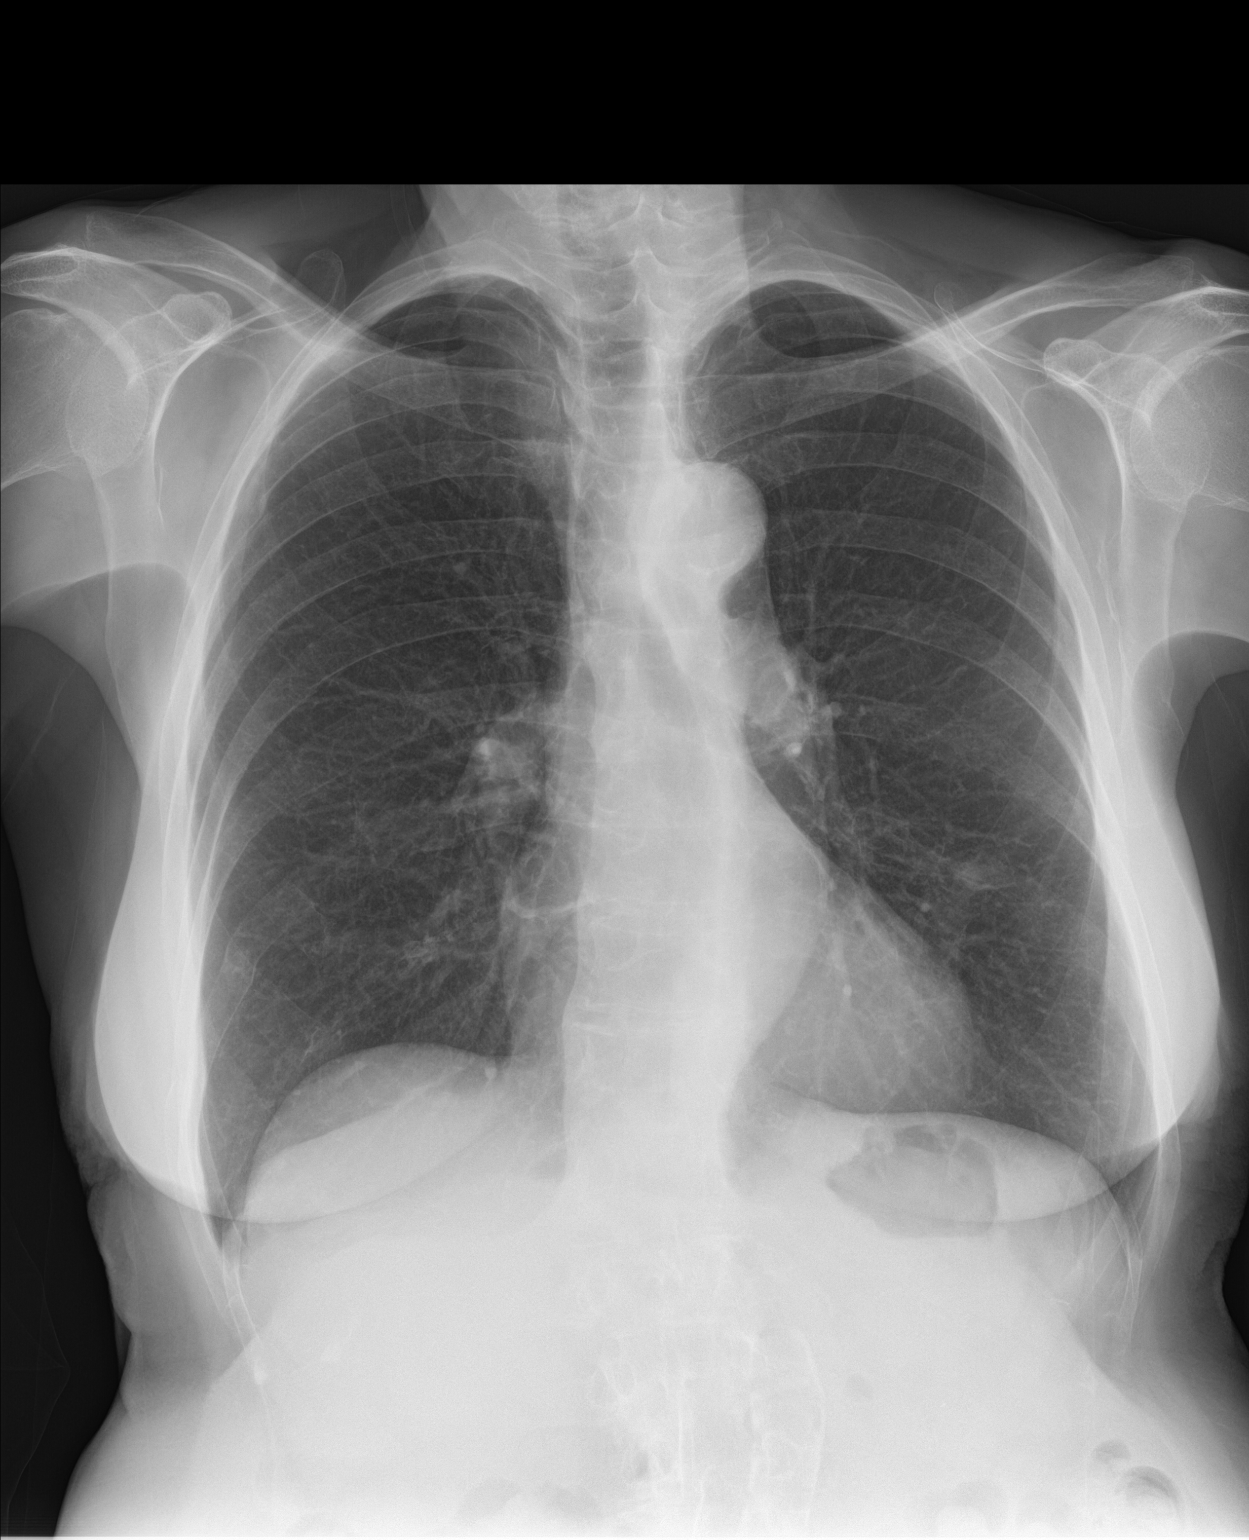

[chest lat]
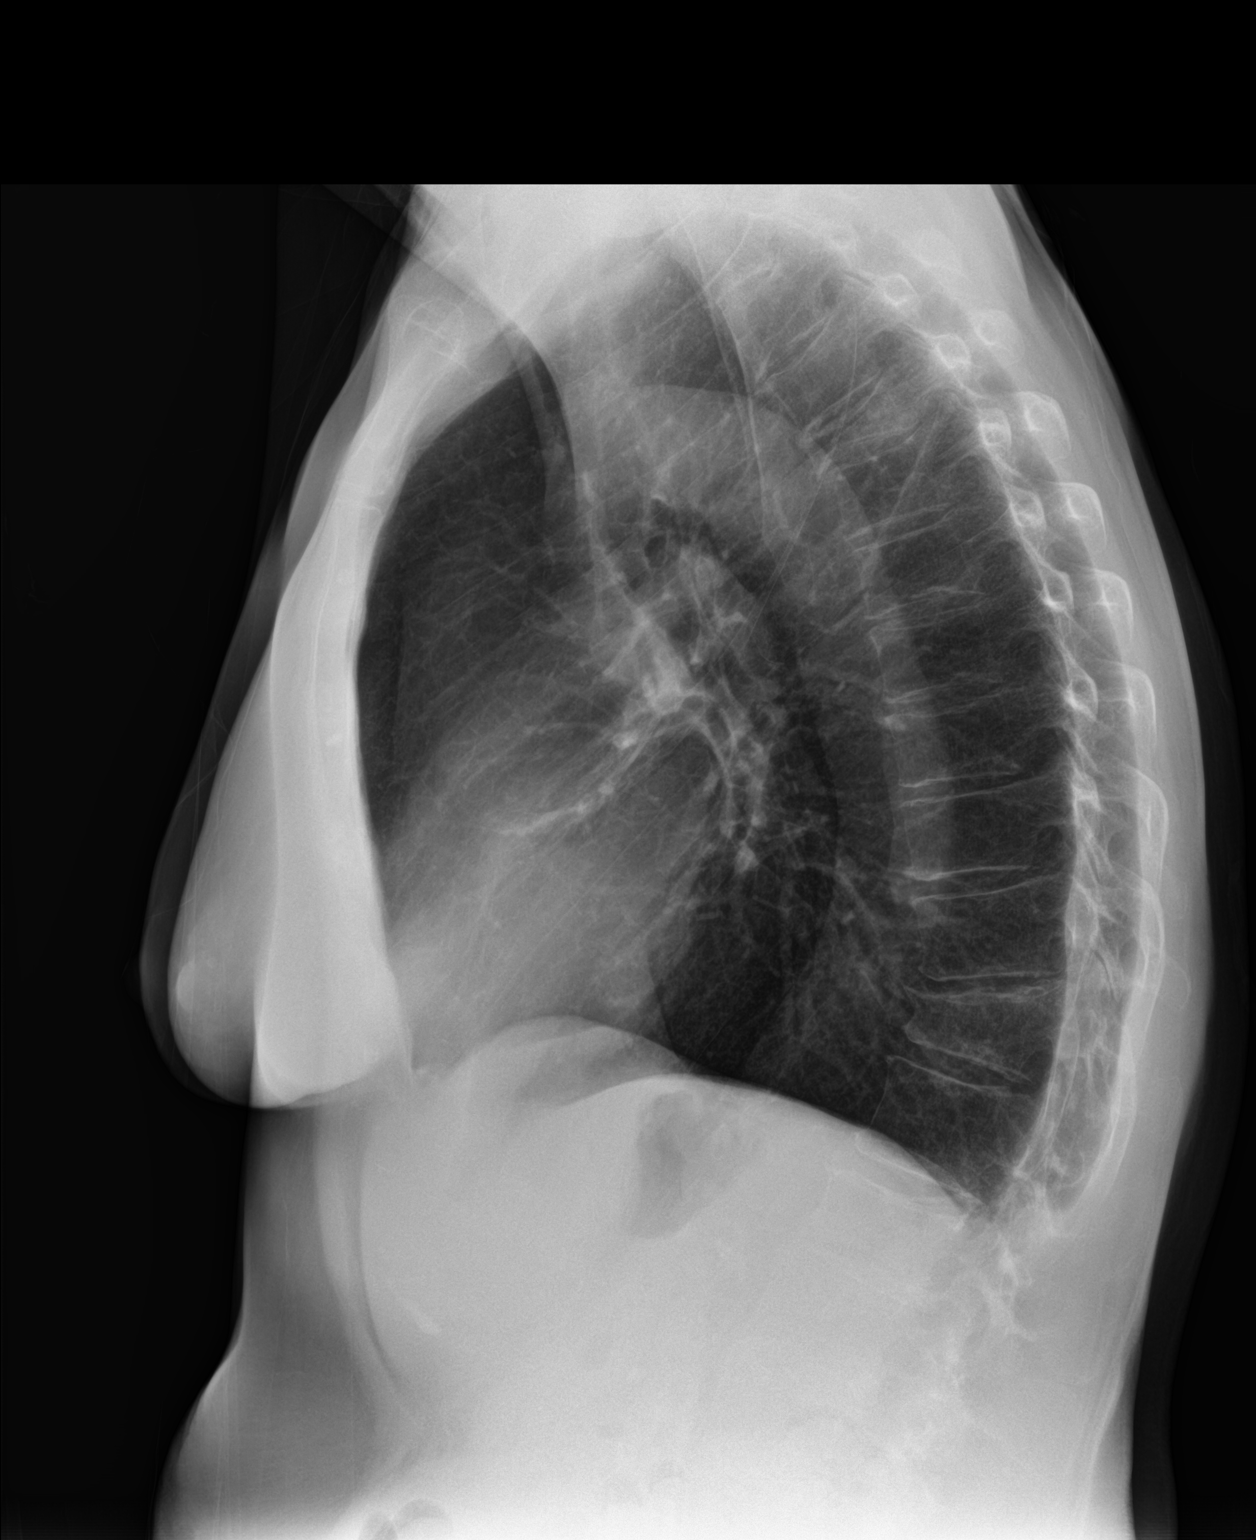

[2 of 2 positions shown; findings below may reference images not displayed]

FINDINGS: Lungs hyperinflated . Minimal residual linear scarring or
subsegmental atelectasis in the lingula, otherwise lungs are clear.
Heart size normal. Tortuous atheromatous aorta. Old right tenth rib
fracture. No pneumothorax. No effusion.
Old T12 compression fracture deformity.
IMPRESSION: 1. Minimal residual linear scarring or subsegmental atelectasis in
the lingula. No acute abnormality.

## 2015-07-18 NOTE — Assessment & Plan Note (Addendum)
Presentation most consistent with self limited viral infection in chest (??attenuated pneumonia?) Given recent pneumonia--will check CXR CXR shows residual scarring but no new findings Reassured her

## 2015-07-18 NOTE — Progress Notes (Signed)
Subjective:    Patient ID: Tanya Knight, female    DOB: Sep 01, 1940, 75 y.o.   MRN: [id number]  HPI Here due to cough  3 nights ago--- felt tired and then had fever 2 days ago--noticed cough but couldn't bring anything up (but felt tight) Then muscle aches started---same feeling as with pneumonia Fever again 2 nights ago Increased cough yesterday--- slight brown mucus (but mostly just felt tight) Head seems clear--no nasal congestion or drainage  Feels weak--had to sit just fixing breakfast Back hurting--like with pneumonia Some DOE--going up stairs No sweats or chills  Tried dayquil and nyquil--may have helped her sleep  Current Outpatient Prescriptions on File Prior to Visit  Medication Sig Dispense Refill  . amLODipine (NORVASC) 2.5 MG tablet Take 1 tablet (2.5 mg total) by mouth daily. 90 tablet 3  . aspirin 81 MG tablet Take 81 mg by mouth daily.      Marland Kitchen CALCIUM PO Take by mouth.      . divalproex (DEPAKOTE) 500 MG DR tablet Take 1 tablet (500 mg total) by mouth 2 (two) times daily. 180 tablet 3  . estradiol (ESTRING) 2 MG vaginal ring Place 2 mg vaginally every 3 (three) months. follow package directions 1 each 4  . ibandronate (BONIVA) 150 MG tablet TAKE 1 TABLET BY MOUTH EVERY MONTH IN THE MORNING WITH WATER. DO NOT LIE DOWN OR EAT FOR 30 MINUTES AFTER 3 tablet 3  . losartan (COZAAR) 100 MG tablet Take 1 tablet (100 mg total) by mouth daily. 90 tablet 3  . metoprolol succinate (TOPROL-XL) 50 MG 24 hr tablet Take 1 tablet (50 mg total) by mouth daily. Take with or immediately following a meal. 90 tablet 3  . Multiple Vitamin (MULTIVITAMIN) capsule Take 1 capsule by mouth daily.      . Multiple Vitamins-Minerals (OCUVITE PO) Take by mouth.      . Omega-3 Fatty Acids (FISH OIL PO) Take by mouth.      . Vitamin D, Ergocalciferol, (DRISDOL) 50000 UNITS CAPS capsule TAKE 1 CAPSULE BY MOUTH EVERY 14 DAYS 10 capsule 2   No current facility-administered medications on file prior  to visit.    Allergies  Allergen Reactions  . Phenytoin     Other reaction(s): Other (See Comments) Other Reaction: Other reaction REACTION: severe reaction    Past Medical History  Diagnosis Date  . GERD (gastroesophageal reflux disease)   . Osteoporosis   . Seizure disorder ([place])   . Vitamin D deficiency   . Fracture of metatarsal     Repair of fractured R metatarsal --Dr Duda---4/08  . Hypertension   . Seizures [facility])     Past Surgical History  Procedure Laterality Date  . Tonsillectomy and adenoidectomy  1948  . Foot surgery  2008  . Eye surgery      Obstructed tear duct  . Tear duct probing  10/12    temporary stent  . Cataract extraction, bilateral Bilateral 2014    Family History  Problem Relation Age of Onset  . Hypertension Mother   . Breast cancer Daughter   . Diabetes Maternal Aunt   . Coronary artery disease Paternal Aunt   . Breast cancer Paternal Aunt   . Coronary artery disease Paternal Uncle   . Heart disease Maternal Grandmother   . Heart disease Maternal Grandfather   . Heart disease Paternal Grandmother   . Heart disease Paternal Grandfather   . Heart disease Father   . Colon cancer Neg Hx   .  Stomach cancer Neg Hx   . Pancreatic cancer Neg Hx   . Rectal cancer Neg Hx     Social History   Social History  . Marital Status: Married    Spouse Name: N/A  . Number of Children: 3  . Years of Education: N/A   Occupational History  . retired Control and instrumentation engineer    Social History Main Topics  . Smoking status: Never Smoker   . Smokeless tobacco: Never Used  . Alcohol Use: 0.0 oz/week    0 Standard drinks or equivalent per week     Comment: occasional  . Drug Use: No  . Sexual Activity: Yes    Birth Control/ Protection: Post-menopausal   Other Topics Concern  . Not on file   Social History Narrative   Regular exercise-yes---aerobics, Pilates, jogged in past (now walks)      Has living will   Husband, then one of her children, has  health care POA   Would accept resuscitation but no prolonged artificial ventilation   Probably would not want tube feeds if cognitively unaware   Review of Systems No rash Appetite is fine No vomiting or diarrhea    Objective:   Physical Exam  HENT:  Nose: Nose normal.  Mouth/Throat: Oropharynx is clear and moist. No oropharyngeal exudate.  No sinus tenderness TMs normal  Neck: Normal range of motion. Neck supple. No thyromegaly present.  Pulmonary/Chest: Effort normal and breath sounds normal. No respiratory distress. She has no wheezes. She has no rales.  No dullness  Lymphadenopathy:    She has no cervical adenopathy.          Assessment & Plan:

## 2015-07-18 NOTE — Progress Notes (Signed)
Pre visit review using our clinic review tool, if applicable. No additional management support is needed unless otherwise documented below in the visit note. 

## 2015-09-14 ENCOUNTER — Ambulatory Visit (INDEPENDENT_AMBULATORY_CARE_PROVIDER_SITE_OTHER): Payer: Medicare Other | Admitting: Internal Medicine

## 2015-09-14 ENCOUNTER — Encounter: Payer: Self-pay | Admitting: Internal Medicine

## 2015-09-14 VITALS — BP 136/78 | HR 60 | Temp 97.5°F | Ht 61.5 in | Wt 127.0 lb

## 2015-09-14 DIAGNOSIS — I10 Essential (primary) hypertension: Secondary | ICD-10-CM | POA: Diagnosis not present

## 2015-09-14 DIAGNOSIS — G40909 Epilepsy, unspecified, not intractable, without status epilepticus: Secondary | ICD-10-CM | POA: Diagnosis not present

## 2015-09-14 DIAGNOSIS — E871 Hypo-osmolality and hyponatremia: Secondary | ICD-10-CM | POA: Diagnosis not present

## 2015-09-14 DIAGNOSIS — Z7189 Other specified counseling: Secondary | ICD-10-CM

## 2015-09-14 DIAGNOSIS — K219 Gastro-esophageal reflux disease without esophagitis: Secondary | ICD-10-CM | POA: Diagnosis not present

## 2015-09-14 DIAGNOSIS — N951 Menopausal and female climacteric states: Secondary | ICD-10-CM

## 2015-09-14 DIAGNOSIS — Z0000 Encounter for general adult medical examination without abnormal findings: Secondary | ICD-10-CM

## 2015-09-14 DIAGNOSIS — M810 Age-related osteoporosis without current pathological fracture: Secondary | ICD-10-CM

## 2015-09-14 LAB — CBC WITH DIFFERENTIAL/PLATELET
BASOS ABS: 0.1 10*3/uL (ref 0.0–0.1)
Basophils Relative: 0.9 % (ref 0.0–3.0)
EOS PCT: 3.3 % (ref 0.0–5.0)
Eosinophils Absolute: 0.2 10*3/uL (ref 0.0–0.7)
HEMATOCRIT: 44.7 % (ref 36.0–46.0)
HEMOGLOBIN: 15.1 g/dL — AB (ref 12.0–15.0)
LYMPHS PCT: 19 % (ref 12.0–46.0)
Lymphs Abs: 1.1 10*3/uL (ref 0.7–4.0)
MCHC: 33.6 g/dL (ref 30.0–36.0)
MCV: 93.3 fl (ref 78.0–100.0)
MONOS PCT: 9.5 % (ref 3.0–12.0)
Monocytes Absolute: 0.6 10*3/uL (ref 0.1–1.0)
NEUTROS PCT: 67.3 % (ref 43.0–77.0)
Neutro Abs: 4 10*3/uL (ref 1.4–7.7)
Platelets: 248 10*3/uL (ref 150.0–400.0)
RBC: 4.8 Mil/uL (ref 3.87–5.11)
RDW: 13.9 % (ref 11.5–15.5)
WBC: 5.9 10*3/uL (ref 4.0–10.5)

## 2015-09-14 LAB — COMPREHENSIVE METABOLIC PANEL
ALK PHOS: 56 U/L (ref 39–117)
ALT: 17 U/L (ref 0–35)
AST: 26 U/L (ref 0–37)
Albumin: 5 g/dL (ref 3.5–5.2)
BILIRUBIN TOTAL: 0.7 mg/dL (ref 0.2–1.2)
BUN: 11 mg/dL (ref 6–23)
CALCIUM: 10.4 mg/dL (ref 8.4–10.5)
CO2: 27 mEq/L (ref 19–32)
Chloride: 93 mEq/L — ABNORMAL LOW (ref 96–112)
Creatinine, Ser: 0.7 mg/dL (ref 0.40–1.20)
GFR: 86.8 mL/min (ref >60.00)
GLUCOSE: 94 mg/dL (ref 70–99)
POTASSIUM: 4.9 meq/L (ref 3.5–5.1)
Sodium: 128 mEq/L — ABNORMAL LOW (ref 135–145)
TOTAL PROTEIN: 7.6 g/dL (ref 6.0–8.3)

## 2015-09-14 LAB — T4, FREE: Free T4: 2.54 ng/dL — ABNORMAL HIGH (ref 0.60–1.60)

## 2015-09-14 MED ORDER — DIVALPROEX SODIUM 500 MG PO DR TAB: 500.0000 mg | DELAYED_RELEASE_TABLET | Freq: Two times a day (BID) | ORAL | Status: DC

## 2015-09-14 MED ORDER — AMLODIPINE BESYLATE 2.5 MG PO TABS: 2.5000 mg | ORAL_TABLET | Freq: Every day | ORAL | Status: DC

## 2015-09-14 MED ORDER — VITAMIN D (ERGOCALCIFEROL) 1.25 MG (50000 UNIT) PO CAPS: ORAL_CAPSULE | ORAL | Status: DC

## 2015-09-14 MED ORDER — ESTRADIOL 0.1 MG/GM VA CREA: 1.0000 | TOPICAL_CREAM | VAGINAL | Status: DC

## 2015-09-14 MED ORDER — IBANDRONATE SODIUM 150 MG PO TABS: ORAL_TABLET | ORAL | Status: DC

## 2015-09-14 MED ORDER — METOPROLOL SUCCINATE ER 50 MG PO TB24: 50.0000 mg | ORAL_TABLET | Freq: Every day | ORAL | Status: DC

## 2015-09-14 MED ORDER — LOSARTAN POTASSIUM 100 MG PO TABS: 100.0000 mg | ORAL_TABLET | Freq: Every day | ORAL | Status: DC

## 2015-09-14 NOTE — Assessment & Plan Note (Signed)
BP Readings from Last 3 Encounters:  09/14/15 136/78  07/18/15 134/80  02/12/15 138/70   Good control Due for labs

## 2015-09-14 NOTE — Assessment & Plan Note (Signed)
Chronic for 20 years Will recheck No Rx

## 2015-09-14 NOTE — Assessment & Plan Note (Signed)
Mild Just occasional tums

## 2015-09-14 NOTE — Assessment & Plan Note (Signed)
Mostly vaginal Will try cream instead---stop the ring if effective

## 2015-09-14 NOTE — Assessment & Plan Note (Signed)
See social history 

## 2015-09-14 NOTE — Assessment & Plan Note (Signed)
I have personally reviewed the Medicare Annual Wellness questionnaire and have noted 1. The patient's medical and social history 2. Their use of alcohol, tobacco or illicit drugs 3. Their current medications and supplements 4. The patient's functional ability including ADL's, fall risks, home safety risks and hearing or visual             impairment. 5. Diet and physical activities 6. Evidence for depression or mood disorders  The patients weight, height, BMI and visual acuity have been recorded in the chart I have made referrals, counseling and provided education to the patient based review of the above and I have provided the pt with a written personalized care plan for preventive services.  I have provided you with a copy of your personalized plan for preventive services. Please take the time to review along with your updated medication list.  UTD on imms--yearly flu Colon due 2020 mammo --- she continues yearly Exercises regularly

## 2015-09-14 NOTE — Progress Notes (Signed)
Pre visit review using our clinic review tool, if applicable. No additional management support is needed unless otherwise documented below in the visit note. 

## 2015-09-14 NOTE — Assessment & Plan Note (Signed)
None on depakote Will check levels

## 2015-09-14 NOTE — Assessment & Plan Note (Signed)
Continue ibandronate till 2018---then check DEXA

## 2015-09-14 NOTE — Progress Notes (Signed)
Subjective:    Patient ID: Tanya Knight, female    DOB: 1940-08-01, 75 y.o.   MRN: [id number]  HPI Here for Medicare wellness and follow up of chronic medical conditions Reviewed form and advanced directives Reviewed other doctors Occasional alcohol No tobacco Regular exercise Vision is fine. Mild hearing loss--not ready for aide No falls No depression or anhedonia Independent with instrumental ADLs No apparent cognitive problems  She feels great No new concerns Finally over the pneumonia  Doing well with BP treatment Generally good--she checks it sporadically No chest pain No SOB No dizziness or syncope No headaches No edema  No seizures  Cut the depakote down---then she got a funny feeling Now holding at 4500mg  weekly  Still on boniva Regular exercise---including weight bearing exercise Calcium ad vitamin D  Will get some heartburn at times Notices it if reclines or lies down after eating tums helps----maybe once a week No swallowing problems  Current Outpatient Prescriptions on File Prior to Visit  Medication Sig Dispense Refill  . amLODipine (NORVASC) 2.5 MG tablet Take 1 tablet (2.5 mg total) by mouth daily. 90 tablet 3  . aspirin 81 MG tablet Take 81 mg by mouth daily.      Marland Kitchen CALCIUM PO Take by mouth.      . divalproex (DEPAKOTE) 500 MG DR tablet Take 1 tablet (500 mg total) by mouth 2 (two) times daily. 180 tablet 3  . estradiol (ESTRING) 2 MG vaginal ring Place 2 mg vaginally every 3 (three) months. follow package directions 1 each 4  . ibandronate (BONIVA) 150 MG tablet TAKE 1 TABLET BY MOUTH EVERY MONTH IN THE MORNING WITH WATER. DO NOT LIE DOWN OR EAT FOR 30 MINUTES AFTER 3 tablet 3  . losartan (COZAAR) 100 MG tablet Take 1 tablet (100 mg total) by mouth daily. 90 tablet 3  . metoprolol succinate (TOPROL-XL) 50 MG 24 hr tablet Take 1 tablet (50 mg total) by mouth daily. Take with or immediately following a meal. 90 tablet 3  . Multiple Vitamin  (MULTIVITAMIN) capsule Take 1 capsule by mouth daily.      . Multiple Vitamins-Minerals (OCUVITE PO) Take by mouth.      . Omega-3 Fatty Acids (FISH OIL PO) Take by mouth.      . Vitamin D, Ergocalciferol, (DRISDOL) 50000 UNITS CAPS capsule TAKE 1 CAPSULE BY MOUTH EVERY 14 DAYS 10 capsule 2   No current facility-administered medications on file prior to visit.    Allergies  Allergen Reactions  . Phenytoin     Other reaction(s): Other (See Comments) Other Reaction: Other reaction REACTION: severe reaction    Past Medical History  Diagnosis Date  . GERD (gastroesophageal reflux disease)   . Osteoporosis   . Seizure disorder ([place])   . Vitamin D deficiency   . Fracture of metatarsal     Repair of fractured R metatarsal --Dr Duda---4/08  . Hypertension   . Seizures [facility])     Past Surgical History  Procedure Laterality Date  . Tonsillectomy and adenoidectomy  1948  . Foot surgery  2008  . Eye surgery      Obstructed tear duct  . Tear duct probing  10/12    temporary stent  . Cataract extraction, bilateral Bilateral 2014    Family History  Problem Relation Age of Onset  . Hypertension Mother   . Breast cancer Daughter   . Diabetes Maternal Aunt   . Coronary artery disease Paternal Aunt   . Breast  cancer Paternal Aunt   . Coronary artery disease Paternal Uncle   . Heart disease Maternal Grandmother   . Heart disease Maternal Grandfather   . Heart disease Paternal Grandmother   . Heart disease Paternal Grandfather   . Heart disease Father   . Colon cancer Neg Hx   . Stomach cancer Neg Hx   . Pancreatic cancer Neg Hx   . Rectal cancer Neg Hx     Social History   Social History  . Marital Status: Married    Spouse Name: N/A  . Number of Children: 3  . Years of Education: N/A   Occupational History  . retired Control and instrumentation engineer    Social History Main Topics  . Smoking status: Never Smoker   . Smokeless tobacco: Never Used  . Alcohol Use: 0.0 oz/week    0  Standard drinks or equivalent per week     Comment: occasional  . Drug Use: No  . Sexual Activity: Yes    Birth Control/ Protection: Post-menopausal   Other Topics Concern  . Not on file   Social History Narrative   Regular exercise-yes---aerobics, Pilates, jogged in past (now walks)      Has living will   Husband, then one of her children, has health care POA   Would accept resuscitation but no prolonged artificial ventilation   Probably would not want tube feeds if cognitively unaware   Review of Systems Wears seat belt Teeth okay--keeps up with dentist Mild back pain at times--- no meds though No rash or suspicious skin lesions Bowels are slow--uses miralax and only goes every 3-4 days Voids okay--- no incontinence Still on vaginal estrogen--hasn't tried off this. Discussed seeing if she can go longer    Objective:   Physical Exam  Constitutional: She is oriented to person, place, and time. She appears well-developed and well-nourished. No distress.  HENT:  Mouth/Throat: Oropharynx is clear and moist. No oropharyngeal exudate.  Neck: Normal range of motion. Neck supple. No thyromegaly present.  Cardiovascular: Normal rate, regular rhythm, normal heart sounds and intact distal pulses.  Exam reveals no gallop.   No murmur heard. Pulmonary/Chest: Effort normal and breath sounds normal. No respiratory distress. She has no wheezes. She has no rales.  Abdominal: Soft. There is no tenderness.  Musculoskeletal: She exhibits no edema or tenderness.  Lymphadenopathy:    She has no cervical adenopathy.  Neurological: She is alert and oriented to person, place, and time.  President-- "Dwaine Deter, [place]" [phone] [id number] Recall 3/3  Skin: No rash noted. No erythema.  Psychiatric: She has a normal mood and affect. Her behavior is normal.          Assessment & Plan:

## 2015-09-15 ENCOUNTER — Other Ambulatory Visit: Payer: Self-pay | Admitting: Internal Medicine

## 2015-09-15 DIAGNOSIS — R946 Abnormal results of thyroid function studies: Secondary | ICD-10-CM

## 2015-09-15 LAB — VALPROIC ACID LEVEL: Valproic Acid Lvl: 55.6 ug/mL (ref 50.0–100.0)

## 2015-09-19 ENCOUNTER — Other Ambulatory Visit (INDEPENDENT_AMBULATORY_CARE_PROVIDER_SITE_OTHER): Payer: Medicare Other

## 2015-09-19 DIAGNOSIS — R946 Abnormal results of thyroid function studies: Secondary | ICD-10-CM | POA: Diagnosis not present

## 2015-09-19 LAB — TSH: TSH: 1.45 u[IU]/mL (ref 0.35–4.50)

## 2015-09-19 LAB — T4, FREE: Free T4: 1.47 ng/dL (ref 0.60–1.60)

## 2015-11-10 DIAGNOSIS — D039 Melanoma in situ, unspecified: Secondary | ICD-10-CM

## 2015-11-10 HISTORY — DX: Melanoma in situ, unspecified: D03.9

## 2015-11-10 HISTORY — PX: MELANOMA EXCISION: SHX5266

## 2015-11-29 ENCOUNTER — Encounter: Payer: Self-pay | Admitting: Internal Medicine

## 2015-11-29 DIAGNOSIS — D039 Melanoma in situ, unspecified: Secondary | ICD-10-CM | POA: Insufficient documentation

## 2016-03-09 LAB — FECAL OCCULT BLOOD, GUAIAC: FECAL OCCULT BLD: NEGATIVE

## 2016-04-16 ENCOUNTER — Encounter: Payer: Self-pay | Admitting: Internal Medicine

## 2016-04-17 ENCOUNTER — Encounter: Payer: Self-pay | Admitting: Internal Medicine

## 2016-09-15 ENCOUNTER — Ambulatory Visit (INDEPENDENT_AMBULATORY_CARE_PROVIDER_SITE_OTHER): Payer: Medicare Other

## 2016-09-15 VITALS — BP 132/80 | HR 59 | Temp 97.6°F | Ht 61.0 in | Wt 130.5 lb

## 2016-09-15 DIAGNOSIS — Z0000 Encounter for general adult medical examination without abnormal findings: Secondary | ICD-10-CM | POA: Diagnosis not present

## 2016-09-15 NOTE — Patient Instructions (Signed)
Tanya Knight , Thank you for taking time to come for your Medicare Wellness Visit. I appreciate your ongoing commitment to your health goals. Please review the following plan we discussed and let me know if I can assist you in the future.   These are the goals we discussed: Goals    . Increase physical activity          Starting 09/15/2016, I will continue to exercise for at least 2 hours 6 days per week.        This is a list of the screening recommended for you and due dates:  Health Maintenance  Topic Date Due  . Pap Smear  09/16/2026*  . Tetanus Vaccine  09/30/2016  . Flu Shot  12/10/2016  . Mammogram  04/14/2018  . Colon Cancer Screening  09/10/2018  . DEXA scan (bone density measurement)  Completed  . Pneumonia vaccines  Completed  *Topic was postponed. The date shown is not the original due date.   Preventive Care for Adults  A healthy lifestyle and preventive care can promote health and wellness. Preventive health guidelines for adults include the following key practices.  . A routine yearly physical is a good way to check with your health care provider about your health and preventive screening. It is a chance to share any concerns and updates on your health and to receive a thorough exam.  . Visit your dentist for a routine exam and preventive care every 6 months. Brush your teeth twice a day and floss once a day. Good oral hygiene prevents tooth decay and gum disease.  . The frequency of eye exams is based on your age, health, family medical history, use  of contact lenses, and other factors. Follow your health care provider's ecommendations for frequency of eye exams.  . Eat a healthy diet. Foods like vegetables, fruits, whole grains, low-fat dairy products, and lean protein foods contain the nutrients you need without too many calories. Decrease your intake of foods high in solid fats, added sugars, and salt. Eat the right amount of calories for you. Get information about  a proper diet from your health care provider, if necessary.  . Regular physical exercise is one of the most important things you can do for your health. Most adults should get at least 150 minutes of moderate-intensity exercise (any activity that increases your heart rate and causes you to sweat) each week. In addition, most adults need muscle-strengthening exercises on 2 or more days a week.  Silver Sneakers may be a benefit available to you. To determine eligibility, you may visit the website: www.silversneakers.com or contact program at 812-690-9109 Mon-Fri between 8AM-8PM.   . Maintain a healthy weight. The body mass index (BMI) is a screening tool to identify possible weight problems. It provides an estimate of body fat based on height and weight. Your health care provider can find your BMI and can help you achieve or maintain a healthy weight.   For adults 20 years and older: ? A BMI below 18.5 is considered underweight. ? A BMI of 18.5 to 24.9 is normal. ? A BMI of 25 to 29.9 is considered overweight. ? A BMI of 30 and above is considered obese.   . Maintain normal blood lipids and cholesterol levels by exercising and minimizing your intake of saturated fat. Eat a balanced diet with plenty of fruit and vegetables. Blood tests for lipids and cholesterol should begin at age 76 and be repeated every 5 years. If your  lipid or cholesterol levels are high, you are over 50, or you are at high risk for heart disease, you may need your cholesterol levels checked more frequently. Ongoing high lipid and cholesterol levels should be treated with medicines if diet and exercise are not working.  . If you smoke, find out from your health care provider how to quit. If you do not use tobacco, please do not start.  . If you choose to drink alcohol, please do not consume more than 2 drinks per day. One drink is considered to be 12 ounces (355 mL) of beer, 5 ounces (148 mL) of wine, or 1.5 ounces (44 mL) of  liquor.  . If you are 73-87 years old, ask your health care provider if you should take aspirin to prevent strokes.  . Use sunscreen. Apply sunscreen liberally and repeatedly throughout the day. You should seek shade when your shadow is shorter than you. Protect yourself by wearing long sleeves, pants, a wide-brimmed hat, and sunglasses year round, whenever you are outdoors.  . Once a month, do a whole body skin exam, using a mirror to look at the skin on your back. Tell your health care provider of new moles, moles that have irregular borders, moles that are larger than a pencil eraser, or moles that have changed in shape or color.

## 2016-09-15 NOTE — Progress Notes (Signed)
Subjective:   Tanya Knight is a 76 y.o. female who presents for Medicare Annual (Subsequent) preventive examination.  Review of Systems: N/A Cardiac Risk Factors include: advanced age (>16men, >19 women);hypertension     Objective:     Vitals: BP 132/80 (BP Location: Right Arm, Patient Position: Sitting, Cuff Size: Normal)   Pulse (!) 59   Temp 97.6 F (36.4 C) (Oral)   Ht 5\' 1"  (1.549 m) Comment: no shoes  Wt 130 lb 8 oz (59.2 kg)   SpO2 98%   BMI 24.66 kg/m   Body mass index is 24.66 kg/m.   Tobacco History  Smoking Status  . Never Smoker  Smokeless Tobacco  . Never Used     Counseling given: No   Past Medical History:  Diagnosis Date  . Fracture of metatarsal    Repair of fractured R metatarsal --Dr Duda---4/08  . GERD (gastroesophageal reflux disease)   . Hypertension   . Melanoma in situ [facility]) 7/17   Dr Kellie Moor  . Osteoporosis   . Seizure disorder ([place])   . Seizures ([place])   . Vitamin D deficiency    Past Surgical History:  Procedure Laterality Date  . CATARACT EXTRACTION, BILATERAL Bilateral 2014  . EYE SURGERY     Obstructed tear duct  . FOOT SURGERY  2008  . MELANOMA EXCISION Left 11/2015   left lower leg  . TEAR DUCT PROBING  10/12   temporary stent  . TONSILLECTOMY AND ADENOIDECTOMY  1948   Family History  Problem Relation Age of Onset  . Hypertension Mother   . Heart disease Father   . Breast cancer Daughter   . Diabetes Maternal Aunt   . Coronary artery disease Paternal Aunt   . Breast cancer Paternal Aunt   . Coronary artery disease Paternal Uncle   . Heart disease Maternal Grandmother   . Heart disease Maternal Grandfather   . Heart disease Paternal Grandmother   . Heart disease Paternal Grandfather   . Colon cancer Neg Hx   . Stomach cancer Neg Hx   . Pancreatic cancer Neg Hx   . Rectal cancer Neg Hx    History  Sexual Activity  . Sexual activity: Yes  . Birth control/ protection: Post-menopausal    Outpatient  Encounter Prescriptions as of 09/15/2016  Medication Sig  . amLODipine (NORVASC) 2.5 MG tablet Take 1 tablet (2.5 mg total) by mouth daily.  Marland Kitchen aspirin 81 MG tablet Take 81 mg by mouth daily.    Marland Kitchen CALCIUM PO Take by mouth.    . divalproex (DEPAKOTE) 500 MG DR tablet Take 1 tablet (500 mg total) by mouth 2 (two) times daily.  Marland Kitchen ibandronate (BONIVA) 150 MG tablet TAKE 1 TABLET BY MOUTH EVERY MONTH IN THE MORNING WITH WATER. DO NOT LIE DOWN OR EAT FOR 30 MINUTES AFTER  . losartan (COZAAR) 100 MG tablet Take 1 tablet (100 mg total) by mouth daily.  . metoprolol succinate (TOPROL-XL) 50 MG 24 hr tablet Take 1 tablet (50 mg total) by mouth daily. Take with or immediately following a meal.  . Multiple Vitamin (MULTIVITAMIN) capsule Take 1 capsule by mouth daily.    . Multiple Vitamins-Minerals (OCUVITE PO) Take by mouth.    . Vitamin D, Ergocalciferol, (DRISDOL) 50000 units CAPS capsule TAKE 1 CAPSULE BY MOUTH EVERY 14 DAYS  . [DISCONTINUED] estradiol (ESTRACE) 0.1 MG/GM vaginal cream Place 1 Applicatorful vaginally 3 (three) times a week.  . [DISCONTINUED] Omega-3 Fatty Acids (FISH OIL PO) Take  by mouth.     No facility-administered encounter medications on file as of 09/15/2016.     Activities of Daily Living In your present state of health, do you have any difficulty performing the following activities: 09/15/2016  Hearing? Y  Vision? N  Difficulty concentrating or making decisions? N  Walking or climbing stairs? N  Dressing or bathing? N  Doing errands, shopping? N  Preparing Food and eating ? N  Using the Toilet? N  In the past six months, have you accidently leaked urine? N  Do you have problems with loss of bowel control? N  Managing your Medications? N  Managing your Finances? N  Housekeeping or managing your Housekeeping? N  Some recent data might be hidden    Patient Care Team: Venia Carbon, MD as PCP - General    Assessment:     Hearing Screening   125Hz  250Hz  500Hz  1000Hz   2000Hz  3000Hz  4000Hz  6000Hz  8000Hz   Right ear:   0 0 0  0    Left ear:   40 40 40  0      Visual Acuity Screening   Right eye Left eye Both eyes  Without correction: 20/25 20/200 20/30  With correction:       Exercise Activities and Dietary recommendations Current Exercise Habits: Home exercise routine, Type of exercise: Other - see comments;yoga (cardio, toning, pilates), Time (Minutes): > 60 (2 hrs), Frequency (Times/Week): 6, Weekly Exercise (Minutes/Week): 0, Intensity: Moderate  Goals    . Increase physical activity          Starting 09/15/2016, I will continue to exercise for at least 2 hours 6 days per week.       Fall Risk Fall Risk  09/15/2016 09/14/2015 09/08/2014 09/06/2013 09/06/2013  Falls in the past year? No No No No No   Depression Screen PHQ 2/9 Scores 09/15/2016 09/14/2015 09/08/2014 09/06/2013  PHQ - 2 Score 0 0 0 0     Cognitive Function MMSE - Mini Mental State Exam 09/15/2016  Orientation to time 5  Orientation to Place 5  Registration 3  Attention/ Calculation 0  Recall 3  Language- name 2 objects 0  Language- repeat 1  Language- follow 3 step command 3  Language- read & follow direction 0  Write a sentence 0  Copy design 0  Total score 20     PLEASE NOTE: A Mini-Cog screen was completed. Maximum score is 20. A value of 0 denotes this part of [place]stein MMSE was not completed or the patient failed this part of the Mini-Cog screening.   Mini-Cog Screening Orientation to Time - Max 5 pts Orientation to Place - Max 5 pts Registration - Max 3 pts Recall - Max 3 pts Language Repeat - Max 1 pts Language Follow 3 Step Command - Max 3 pts     Immunization History  Administered Date(s) Administered  . Hepatitis B 09/14/1997, 10/16/1997, 03/15/1998  . Influenza Whole 03/23/2006, 03/27/2008, 04/03/2010  . Influenza, Seasonal, Injecte, Preservative Fre 02/10/2015  . Influenza-Unspecified 02/10/2016  . Pneumococcal Conjugate-13 09/06/2013  . Pneumococcal  Polysaccharide-23 03/23/2006  . Td 10/01/2006  . Zoster 05/17/2007   Screening Tests Health Maintenance  Topic Date Due  . PAP SMEAR  09/16/2026 (Originally 03/11/2012)  . TETANUS/TDAP  09/30/2016  . INFLUENZA VACCINE  12/10/2016  . MAMMOGRAM  04/14/2018  . COLONOSCOPY  09/10/2018  . DEXA SCAN  Completed  . PNA vac Low Risk Adult  Completed      Plan:  I have personally reviewed and addressed the Medicare Annual Wellness questionnaire and have noted the following in the patient's chart:  A. Medical and social history B. Use of alcohol, tobacco or illicit drugs  C. Current medications and supplements D. Functional ability and status E.  Nutritional status F.  Physical activity G. Advance directives H. List of other physicians I.  Hospitalizations, surgeries, and ER visits in previous 12 months J.  [place] to include hearing, vision, cognitive, depression L. Referrals and appointments - none  In addition, I have reviewed and discussed with patient certain preventive protocols, quality metrics, and best practice recommendations. A written personalized care plan for preventive services as well as general preventive health recommendations were provided to patient.  See attached scanned questionnaire for additional information.   Signed,   Lindell Noe, MHA, BS, LPN Health Coach

## 2016-09-15 NOTE — Progress Notes (Signed)
   Subjective:    Patient ID: Tanya Knight, female    DOB: 28-Jan-1941, 76 y.o.   MRN: [id number]  HPI I reviewed health advisor's note, was available for consultation, and agree with documentation and plan.     Review of Systems     Objective:   Physical Exam        Assessment & Plan:

## 2016-09-15 NOTE — Progress Notes (Signed)
Pre visit review using our clinic review tool, if applicable. No additional management support is needed unless otherwise documented below in the visit note. 

## 2016-09-15 NOTE — Progress Notes (Signed)
PCP notes:   Health maintenance:  No gaps identified.   Tetanus - addressed  Abnormal screenings:   Hearing - failed  Patient concerns:   None  Nurse concerns:  None  Next PCP appt:   09/19/16 @ 1030

## 2016-09-19 ENCOUNTER — Encounter: Payer: Self-pay | Admitting: Internal Medicine

## 2016-09-19 ENCOUNTER — Ambulatory Visit (INDEPENDENT_AMBULATORY_CARE_PROVIDER_SITE_OTHER): Payer: Medicare Other | Admitting: Internal Medicine

## 2016-09-19 VITALS — BP 132/80 | HR 56 | Temp 97.4°F | Ht 61.25 in | Wt 127.0 lb

## 2016-09-19 DIAGNOSIS — D0372 Melanoma in situ of left lower limb, including hip: Secondary | ICD-10-CM | POA: Diagnosis not present

## 2016-09-19 DIAGNOSIS — I10 Essential (primary) hypertension: Secondary | ICD-10-CM

## 2016-09-19 DIAGNOSIS — G40909 Epilepsy, unspecified, not intractable, without status epilepticus: Secondary | ICD-10-CM | POA: Diagnosis not present

## 2016-09-19 DIAGNOSIS — M810 Age-related osteoporosis without current pathological fracture: Secondary | ICD-10-CM | POA: Diagnosis not present

## 2016-09-19 LAB — CBC WITH DIFFERENTIAL/PLATELET
BASOS PCT: 1.5 % (ref 0.0–3.0)
Basophils Absolute: 0.1 10*3/uL (ref 0.0–0.1)
EOS PCT: 3.7 % (ref 0.0–5.0)
Eosinophils Absolute: 0.2 10*3/uL (ref 0.0–0.7)
HEMATOCRIT: 43.4 % (ref 36.0–46.0)
HEMOGLOBIN: 14.7 g/dL (ref 12.0–15.0)
LYMPHS PCT: 26.3 % (ref 12.0–46.0)
Lymphs Abs: 1.3 10*3/uL (ref 0.7–4.0)
MCHC: 33.9 g/dL (ref 30.0–36.0)
MCV: 94.9 fl (ref 78.0–100.0)
Monocytes Absolute: 0.6 10*3/uL (ref 0.1–1.0)
Monocytes Relative: 13 % — ABNORMAL HIGH (ref 3.0–12.0)
NEUTROS ABS: 2.7 10*3/uL (ref 1.4–7.7)
Neutrophils Relative %: 55.5 % (ref 43.0–77.0)
PLATELETS: 246 10*3/uL (ref 150.0–400.0)
RBC: 4.57 Mil/uL (ref 3.87–5.11)
RDW: 13.5 % (ref 11.5–15.5)
WBC: 4.9 10*3/uL (ref 4.0–10.5)

## 2016-09-19 LAB — COMPREHENSIVE METABOLIC PANEL
ALT: 14 U/L (ref 0–35)
AST: 23 U/L (ref 0–37)
Albumin: 4.8 g/dL (ref 3.5–5.2)
Alkaline Phosphatase: 54 U/L (ref 39–117)
BILIRUBIN TOTAL: 0.8 mg/dL (ref 0.2–1.2)
BUN: 9 mg/dL (ref 6–23)
CALCIUM: 10.7 mg/dL — AB (ref 8.4–10.5)
CHLORIDE: 97 meq/L (ref 96–112)
CO2: 28 meq/L (ref 19–32)
Creatinine, Ser: 0.71 mg/dL (ref 0.40–1.20)
GFR: 85.15 mL/min (ref >60.00)
Glucose, Bld: 95 mg/dL (ref 70–99)
POTASSIUM: 5 meq/L (ref 3.5–5.1)
Sodium: 133 mEq/L — ABNORMAL LOW (ref 135–145)
Total Protein: 7.5 g/dL (ref 6.0–8.3)

## 2016-09-19 LAB — T4, FREE: Free T4: 1.55 ng/dL (ref 0.60–1.60)

## 2016-09-19 MED ORDER — LOSARTAN POTASSIUM 100 MG PO TABS: 100.0000 mg | ORAL_TABLET | Freq: Every day | ORAL | 3 refills | Status: DC

## 2016-09-19 MED ORDER — AMLODIPINE BESYLATE 2.5 MG PO TABS: 2.5000 mg | ORAL_TABLET | Freq: Every day | ORAL | 3 refills | Status: DC

## 2016-09-19 MED ORDER — DIVALPROEX SODIUM 500 MG PO DR TAB: 500.0000 mg | DELAYED_RELEASE_TABLET | Freq: Two times a day (BID) | ORAL | 3 refills | Status: DC

## 2016-09-19 MED ORDER — METOPROLOL SUCCINATE ER 50 MG PO TB24: 50.0000 mg | ORAL_TABLET | Freq: Every day | ORAL | 3 refills | Status: DC

## 2016-09-19 NOTE — Assessment & Plan Note (Signed)
Will stop the ibandronate Change to daily Vit D Calcium in food and MVI only Regular exercise, balance, etc Would consider prolia if had fracture--defer DEXA

## 2016-09-19 NOTE — Assessment & Plan Note (Signed)
Good control  No changes needed  BP Readings from Last 3 Encounters:  09/19/16 132/80  09/15/16 132/80  09/14/15 136/78

## 2016-09-19 NOTE — Assessment & Plan Note (Signed)
No problems on the depakote Will check labs

## 2016-09-19 NOTE — Assessment & Plan Note (Signed)
Resected last July Still on 3 month follow ups

## 2016-09-19 NOTE — Progress Notes (Signed)
Subjective:    Patient ID: Tanya Knight, female    DOB: Mar 02, 1941, 76 y.o.   MRN: [id number]  HPI Here for follow up of chronic health conditions Had Medicare AMW recently-no gaps  Reviewed the osteoporosis Will be done with the 5 years of ibandronate this year Still on calcium and vitamin D  BP has been fine--- usually 130/80 or so No chest pain No palpitations No SOB or DOE No edema No dizziness or syncope  No seizures Has kept with same depakote dose  No stomach problems recently Will have some night heartburn if eats late Will use tums prn No dysphagia  Sees dermatologist ---[place] Melanoma removed in July Gets regular exams still --every 3 months  Current Outpatient Prescriptions on File Prior to Visit  Medication Sig Dispense Refill  . amLODipine (NORVASC) 2.5 MG tablet Take 1 tablet (2.5 mg total) by mouth daily. 90 tablet 3  . aspirin 81 MG tablet Take 81 mg by mouth daily.      Marland Kitchen CALCIUM PO Take by mouth.      . divalproex (DEPAKOTE) 500 MG DR tablet Take 1 tablet (500 mg total) by mouth 2 (two) times daily. 180 tablet 3  . ibandronate (BONIVA) 150 MG tablet TAKE 1 TABLET BY MOUTH EVERY MONTH IN THE MORNING WITH WATER. DO NOT LIE DOWN OR EAT FOR 30 MINUTES AFTER 3 tablet 3  . losartan (COZAAR) 100 MG tablet Take 1 tablet (100 mg total) by mouth daily. 90 tablet 3  . metoprolol succinate (TOPROL-XL) 50 MG 24 hr tablet Take 1 tablet (50 mg total) by mouth daily. Take with or immediately following a meal. 90 tablet 3  . Multiple Vitamin (MULTIVITAMIN) capsule Take 1 capsule by mouth daily.      . Multiple Vitamins-Minerals (OCUVITE PO) Take by mouth.      . Vitamin D, Ergocalciferol, (DRISDOL) 50000 units CAPS capsule TAKE 1 CAPSULE BY MOUTH EVERY 14 DAYS 10 capsule 2   No current facility-administered medications on file prior to visit.     Allergies  Allergen Reactions  . Phenytoin     Other reaction(s): Other (See Comments) Other Reaction: Other  reaction REACTION: severe reaction    Past Medical History:  Diagnosis Date  . Fracture of metatarsal    Repair of fractured R metatarsal --Dr Duda---4/08  . GERD (gastroesophageal reflux disease)   . Hypertension   . Melanoma in situ [facility]) 7/17   Dr Kellie Moor  . Osteoporosis   . Seizure disorder ([place])   . Seizures ([place])   . Vitamin D deficiency     Past Surgical History:  Procedure Laterality Date  . CATARACT EXTRACTION, BILATERAL Bilateral 2014  . EYE SURGERY     Obstructed tear duct  . FOOT SURGERY  2008  . MELANOMA EXCISION Left 11/2015   left lower leg  . TEAR DUCT PROBING  10/12   temporary stent  . TONSILLECTOMY AND ADENOIDECTOMY  1948    Family History  Problem Relation Age of Onset  . Hypertension Mother   . Heart disease Father   . Breast cancer Daughter   . Diabetes Maternal Aunt   . Coronary artery disease Paternal Aunt   . Breast cancer Paternal Aunt   . Coronary artery disease Paternal Uncle   . Heart disease Maternal Grandmother   . Heart disease Maternal Grandfather   . Heart disease Paternal Grandmother   . Heart disease Paternal Grandfather   . Colon cancer Neg Hx   .  Stomach cancer Neg Hx   . Pancreatic cancer Neg Hx   . Rectal cancer Neg Hx     Social History   Social History  . Marital status: Married    Spouse name: N/A  . Number of children: 3  . Years of education: N/A   Occupational History  . retired Control and instrumentation engineer Retired   Social History Main Topics  . Smoking status: Never Smoker  . Smokeless tobacco: Never Used  . Alcohol use 0.0 oz/week     Comment: occasional  . Drug use: No  . Sexual activity: Yes    Birth control/ protection: Post-menopausal   Other Topics Concern  . Not on file   Social History Narrative   Regular exercise-yes---aerobics, Pilates, jogged in past (now walks)      Has living will   Husband, then one of her children, has health care POA   Would accept resuscitation but no prolonged  artificial ventilation   Probably would not want tube feeds if cognitively unaware   Review of Systems  Sleeps well Appetite is good Weight is stable Wears seat belt Bowels are fine No arthritis pain Regular exercise including resistance training     Objective:   Physical Exam  Constitutional: She appears well-nourished. No distress.  HENT:  Mouth/Throat: Oropharynx is clear and moist. No oropharyngeal exudate.  Neck: No thyromegaly present.  Cardiovascular: Normal rate, regular rhythm, normal heart sounds and intact distal pulses.  Exam reveals no gallop.   No murmur heard. Pulmonary/Chest: Effort normal and breath sounds normal. No respiratory distress. She has no wheezes. She has no rales.  Abdominal: Soft. There is no tenderness.  Musculoskeletal: Normal range of motion. She exhibits no edema or tenderness.  Lymphadenopathy:    She has no cervical adenopathy.  Skin: No rash noted. No erythema.  Psychiatric: She has a normal mood and affect. Her behavior is normal.          Assessment & Plan:

## 2016-09-20 LAB — VALPROIC ACID LEVEL: Valproic Acid Lvl: 60.3 ug/mL (ref 50.0–100.0)

## 2017-02-05 ENCOUNTER — Telehealth: Payer: Self-pay

## 2017-02-05 MED ORDER — HYDROCORTISONE 2.5 % EX CREA: TOPICAL_CREAM | Freq: Three times a day (TID) | CUTANEOUS | 3 refills | Status: DC | PRN

## 2017-02-05 NOTE — Telephone Encounter (Signed)
Spoke to pt

## 2017-02-05 NOTE — Telephone Encounter (Signed)
This is usually not fungus Zinc deficiency can cause this as well as some B vitamins---I recommend a multivitamin with zinc I would try 2.5% hydrocortisone cream--I will send that but I would want to see it before trying antifungal

## 2017-02-05 NOTE — Telephone Encounter (Signed)
Pt left v/m; pt has had cracks in corner of one side of mouth; has used OTC meds for this but has not helped; the pharmacist advised pt to contact PCP could be fungus and pt wants to know if can send rx to [facility] [address] Pt last seen annual 09/19/16. Pt request cb.

## 2017-03-16 ENCOUNTER — Other Ambulatory Visit: Payer: Self-pay | Admitting: Internal Medicine

## 2017-03-16 DIAGNOSIS — Z1231 Encounter for screening mammogram for malignant neoplasm of breast: Secondary | ICD-10-CM

## 2017-04-15 ENCOUNTER — Ambulatory Visit
Admission: RE | Admit: 2017-04-15 | Discharge: 2017-04-15 | Disposition: A | Discharge status: Home / Self Care | Payer: Medicare Other | Source: Ambulatory Visit | Attending: Internal Medicine | Admitting: Internal Medicine

## 2017-04-15 DIAGNOSIS — Z1231 Encounter for screening mammogram for malignant neoplasm of breast: Secondary | ICD-10-CM | POA: Diagnosis not present

## 2017-04-15 IMAGING — MG 2D DIGITAL SCREENING BILATERAL MAMMOGRAM WITH CAD AND ADJUNCT TO
9 of 13 series · 9 of 29 positions shown · non-contrast
Comparison: Previous exam(s).

CLINICAL DATA: Screening.

EXAM:
2D DIGITAL SCREENING BILATERAL MAMMOGRAM WITH CAD AND ADJUNCT TOMO

[R CC (1 of 2)]
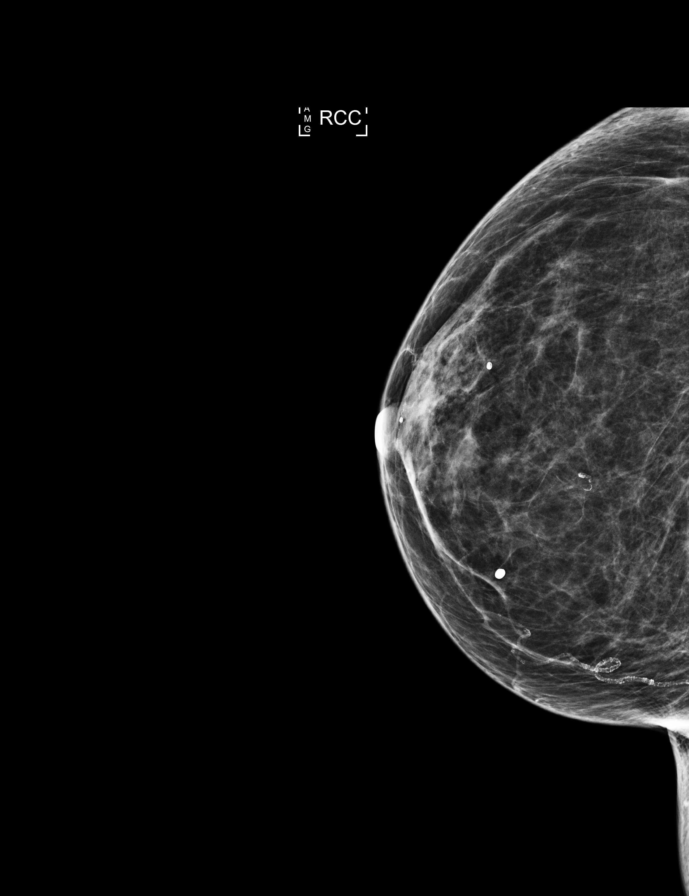

[L CC synth-2D]
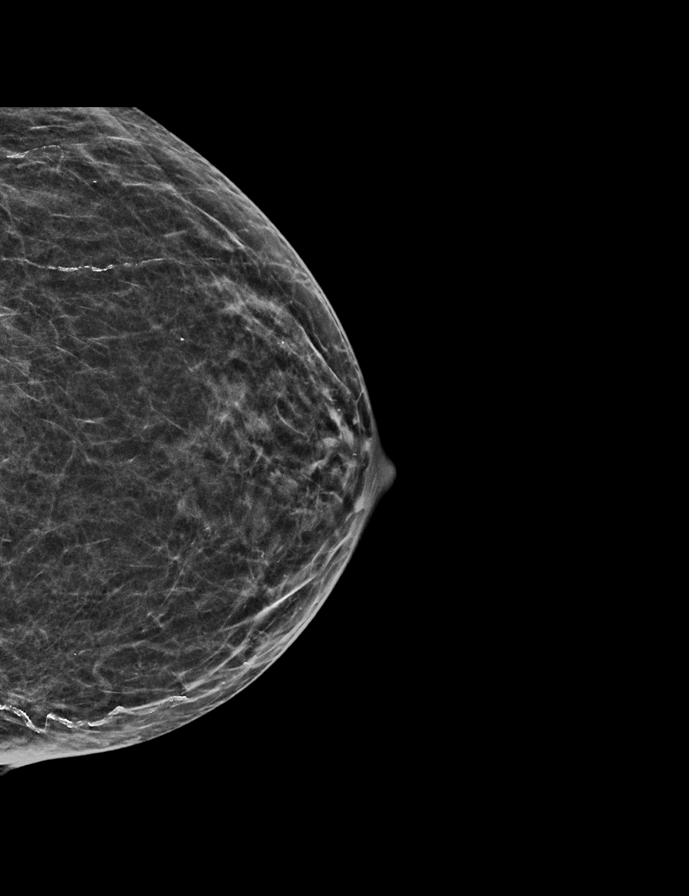

[R MLO synth-2D]
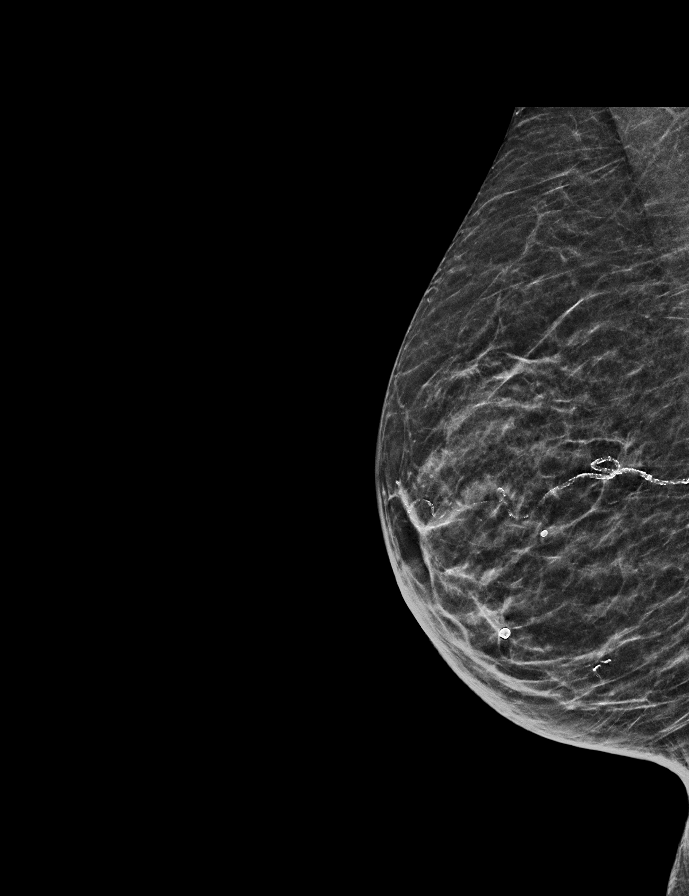

[L MLO synth-2D]
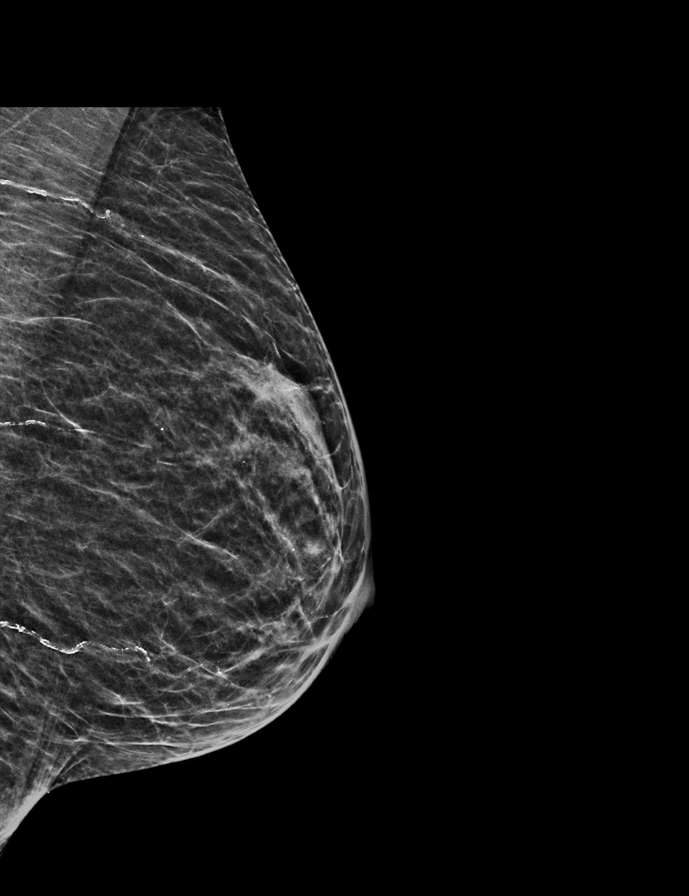

[R CC synth-2D]
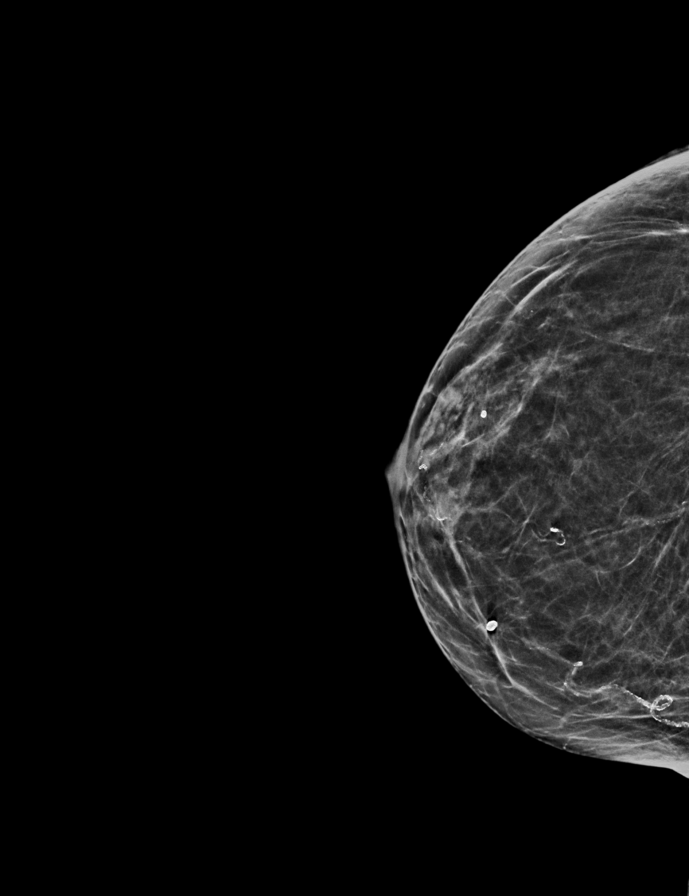

[R CC (2 of 2)]
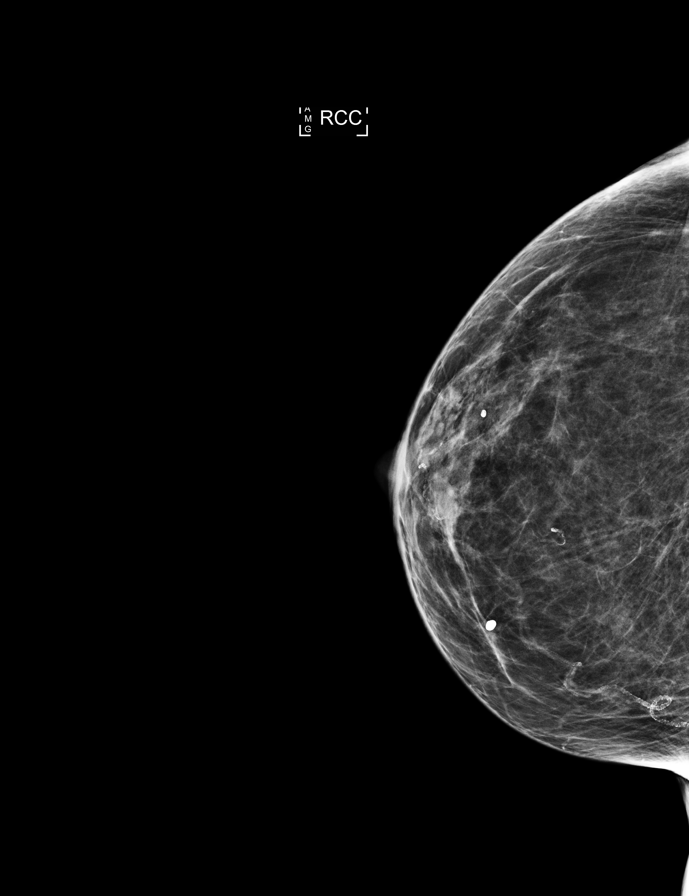

[R MLO]
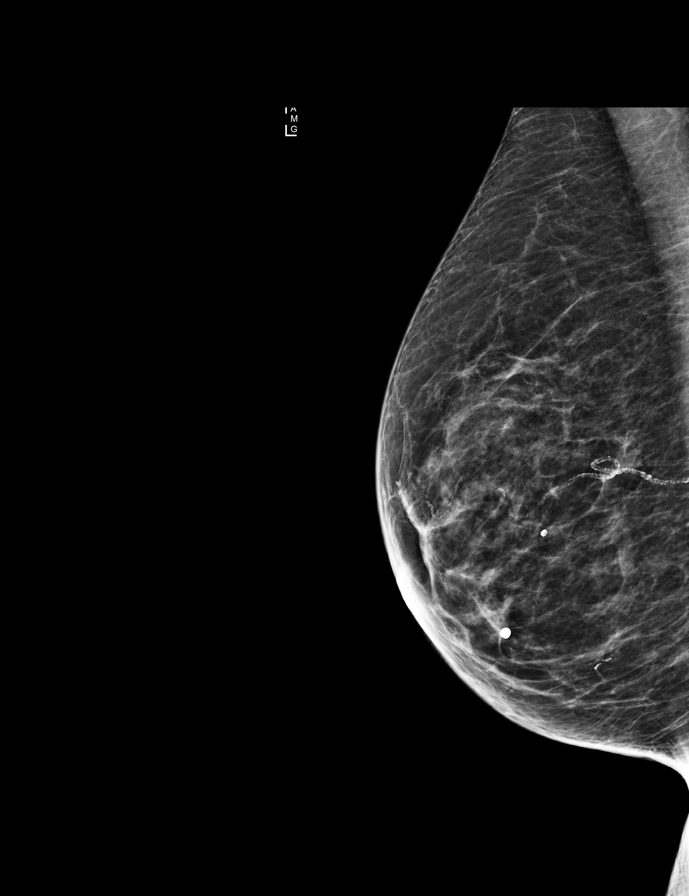

[L CC]
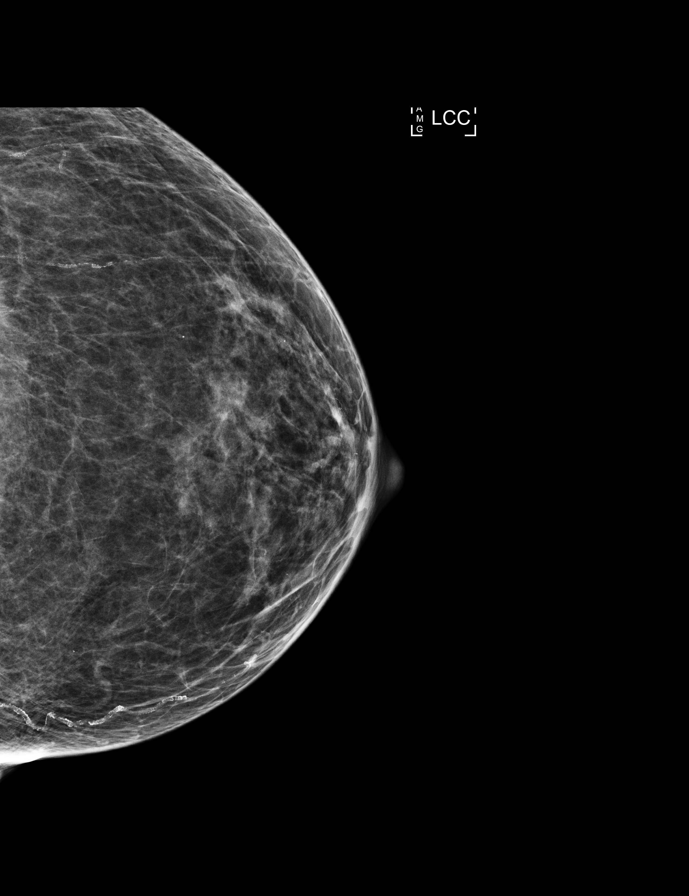

[L MLO]
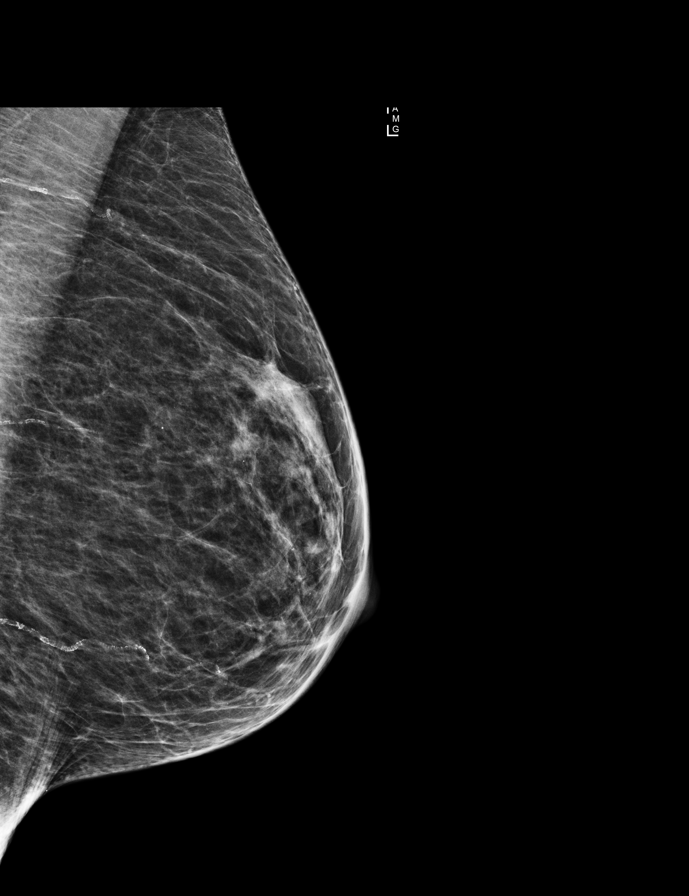

[9 of 29 positions shown; findings below may reference images not displayed]

ACR Breast Density Category b: There are scattered areas of
fibroglandular density.
FINDINGS: There are no findings suspicious for malignancy. Images were
processed with CAD.
IMPRESSION: No mammographic evidence of malignancy. A result letter of this
screening mammogram will be mailed directly to the patient.

RECOMMENDATION:
Screening mammogram in one year. (Code:[33])

BI-RADS CATEGORY  1: Negative.

## 2017-04-21 ENCOUNTER — Inpatient Hospital Stay
Admission: RE | Admit: 2017-04-21 | Discharge: 2017-04-21 | Disposition: A | Discharge status: Home / Self Care | Payer: Self-pay | Source: Ambulatory Visit | Attending: *Deleted | Admitting: *Deleted

## 2017-04-21 ENCOUNTER — Other Ambulatory Visit: Payer: Self-pay | Admitting: *Deleted

## 2017-04-21 DIAGNOSIS — Z9289 Personal history of other medical treatment: Secondary | ICD-10-CM

## 2017-04-22 ENCOUNTER — Encounter: Payer: Self-pay | Admitting: *Deleted

## 2017-09-16 ENCOUNTER — Ambulatory Visit: Payer: Medicare Other | Admitting: Internal Medicine

## 2017-09-21 ENCOUNTER — Ambulatory Visit: Payer: Medicare Other | Admitting: Internal Medicine

## 2017-09-23 ENCOUNTER — Encounter: Payer: Self-pay | Admitting: Internal Medicine

## 2017-09-23 ENCOUNTER — Ambulatory Visit (INDEPENDENT_AMBULATORY_CARE_PROVIDER_SITE_OTHER): Payer: Medicare Other | Admitting: Internal Medicine

## 2017-09-23 VITALS — BP 132/88 | HR 55 | Temp 97.4°F | Ht 61.5 in | Wt 118.0 lb

## 2017-09-23 DIAGNOSIS — G40909 Epilepsy, unspecified, not intractable, without status epilepticus: Secondary | ICD-10-CM

## 2017-09-23 DIAGNOSIS — M810 Age-related osteoporosis without current pathological fracture: Secondary | ICD-10-CM

## 2017-09-23 DIAGNOSIS — I10 Essential (primary) hypertension: Secondary | ICD-10-CM | POA: Diagnosis not present

## 2017-09-23 DIAGNOSIS — Z0000 Encounter for general adult medical examination without abnormal findings: Secondary | ICD-10-CM | POA: Diagnosis not present

## 2017-09-23 DIAGNOSIS — Z7189 Other specified counseling: Secondary | ICD-10-CM | POA: Diagnosis not present

## 2017-09-23 DIAGNOSIS — Z23 Encounter for immunization: Secondary | ICD-10-CM | POA: Diagnosis not present

## 2017-09-23 LAB — COMPREHENSIVE METABOLIC PANEL
ALBUMIN: 4.3 g/dL (ref 3.5–5.2)
ALT: 15 U/L (ref 0–35)
AST: 22 U/L (ref 0–37)
Alkaline Phosphatase: 58 U/L (ref 39–117)
BILIRUBIN TOTAL: 0.5 mg/dL (ref 0.2–1.2)
BUN: 10 mg/dL (ref 6–23)
CALCIUM: 9.8 mg/dL (ref 8.4–10.5)
CO2: 30 mEq/L (ref 19–32)
CREATININE: 0.62 mg/dL (ref 0.40–1.20)
Chloride: 95 mEq/L — ABNORMAL LOW (ref 96–112)
GFR: 99.3 mL/min (ref >60.00)
Glucose, Bld: 93 mg/dL (ref 70–99)
Potassium: 4.8 mEq/L (ref 3.5–5.1)
Sodium: 132 mEq/L — ABNORMAL LOW (ref 135–145)
Total Protein: 7.3 g/dL (ref 6.0–8.3)

## 2017-09-23 LAB — T4, FREE: FREE T4: 0.98 ng/dL (ref 0.60–1.60)

## 2017-09-23 LAB — CBC
HCT: 41.8 % (ref 36.0–46.0)
Hemoglobin: 14.3 g/dL (ref 12.0–15.0)
MCHC: 34.2 g/dL (ref 30.0–36.0)
MCV: 93 fl (ref 78.0–100.0)
Platelets: 266 10*3/uL (ref 150.0–400.0)
RBC: 4.5 Mil/uL (ref 3.87–5.11)
RDW: 14.1 % (ref 11.5–15.5)
WBC: 4.4 10*3/uL (ref 4.0–10.5)

## 2017-09-23 MED ORDER — DIVALPROEX SODIUM 500 MG PO DR TAB: 500.0000 mg | DELAYED_RELEASE_TABLET | Freq: Two times a day (BID) | ORAL | 3 refills | Status: DC

## 2017-09-23 MED ORDER — METOPROLOL SUCCINATE ER 50 MG PO TB24: 50.0000 mg | ORAL_TABLET | Freq: Every day | ORAL | 3 refills | Status: DC

## 2017-09-23 MED ORDER — AMLODIPINE BESYLATE 2.5 MG PO TABS: 2.5000 mg | ORAL_TABLET | Freq: Every day | ORAL | 3 refills | Status: DC

## 2017-09-23 MED ORDER — LOSARTAN POTASSIUM 100 MG PO TABS: 100.0000 mg | ORAL_TABLET | Freq: Every day | ORAL | 3 refills | Status: DC

## 2017-09-23 NOTE — Assessment & Plan Note (Signed)
BP Readings from Last 3 Encounters:  09/23/17 132/88  09/19/16 132/80  09/15/16 132/80   Good control Due for labs

## 2017-09-23 NOTE — Assessment & Plan Note (Signed)
None on the depakote Will do labs

## 2017-09-23 NOTE — Progress Notes (Signed)
Subjective:    Patient ID: Tanya Knight, female    DOB: 09/09/1940, 77 y.o.   MRN: [id number]  HPI Here for Medicare wellness visit and follow up of chronic health conditions Reviewed form and advanced directives Reviewed other doctors Occasional social alcohol No tobacco Regular exercise No falls No depression or anhedonia Vision is fine Hearing loss persists---not ready for a hearing aide Independent with instrumental ADLs No apparent memory problems  Husband had THR and then complications C dif, cellulitis of scrotum, needed caths She has had significant caregiving responsibilities He is slowly improving  No seizures Still on depakote  No chest pain No SOB No palpitations No dizziness or syncope Now able to get back to regular exercise No edema  No new skin problems Sees Dr Kellie Moor  Still on vitamin D and calcium Regular weight bearing exercise  Current Outpatient Medications on File Prior to Visit  Medication Sig Dispense Refill  . amLODipine (NORVASC) 2.5 MG tablet Take 1 tablet (2.5 mg total) by mouth daily. 90 tablet 3  . aspirin 81 MG tablet Take 81 mg by mouth daily.      . cholecalciferol (VITAMIN D) 1000 units tablet Take 1,000 Units by mouth daily.    . divalproex (DEPAKOTE) 500 MG DR tablet Take 1 tablet (500 mg total) by mouth 2 (two) times daily. 180 tablet 3  . hydrocortisone 2.5 % cream Apply topically 3 (three) times daily as needed. 28 g 3  . losartan (COZAAR) 100 MG tablet Take 1 tablet (100 mg total) by mouth daily. 90 tablet 3  . metoprolol succinate (TOPROL-XL) 50 MG 24 hr tablet Take 1 tablet (50 mg total) by mouth daily. Take with or immediately following a meal. 90 tablet 3  . Multiple Vitamin (MULTIVITAMIN) capsule Take 1 capsule by mouth daily.      . Multiple Vitamins-Minerals (OCUVITE PO) Take by mouth.       No current facility-administered medications on file prior to visit.     Allergies  Allergen Reactions  . Phenytoin       Other reaction(s): Other (See Comments) Other Reaction: Other reaction REACTION: severe reaction    Past Medical History:  Diagnosis Date  . Fracture of metatarsal    Repair of fractured R metatarsal --Dr Duda---4/08  . GERD (gastroesophageal reflux disease)   . Hypertension   . Melanoma in situ [facility]) 7/17   Dr Kellie Moor  . Osteoporosis   . Seizure disorder ([place])   . Seizures ([place])   . Vitamin D deficiency     Past Surgical History:  Procedure Laterality Date  . CATARACT EXTRACTION, BILATERAL Bilateral 2014  . EYE SURGERY     Obstructed tear duct  . FOOT SURGERY  2008  . MELANOMA EXCISION Left 11/2015   left lower leg  . TEAR DUCT PROBING  10/12   temporary stent  . TONSILLECTOMY AND ADENOIDECTOMY  1948    Family History  Problem Relation Age of Onset  . Hypertension Mother   . Heart disease Father   . Breast cancer Daughter   . Diabetes Maternal Aunt   . Coronary artery disease Paternal Aunt   . Breast cancer Paternal Aunt   . Coronary artery disease Paternal Uncle   . Heart disease Maternal Grandmother   . Heart disease Maternal Grandfather   . Heart disease Paternal Grandmother   . Heart disease Paternal Grandfather   . Colon cancer Neg Hx   . Stomach cancer Neg Hx   .  Pancreatic cancer Neg Hx   . Rectal cancer Neg Hx     Social History   Socioeconomic History  . Marital status: Married    Spouse name: Not on file  . Number of children: 3  . Years of education: Not on file  . Highest education level: Not on file  Occupational History  . Occupation: retired Financial planner: retired  Scientific laboratory technician  . Financial resource strain: Not on file  . Food insecurity:    Worry: Not on file    Inability: Not on file  . Transportation needs:    Medical: Not on file    Non-medical: Not on file  Tobacco Use  . Smoking status: Never Smoker  . Smokeless tobacco: Never Used  Substance and Sexual Activity  . Alcohol use: Yes    Alcohol/week:  0.0 oz    Comment: occasional  . Drug use: No  . Sexual activity: Yes    Birth control/protection: Post-menopausal  Lifestyle  . Physical activity:    Days per week: Not on file    Minutes per session: Not on file  . Stress: Not on file  Relationships  . Social connections:    Talks on phone: Not on file    Gets together: Not on file    Attends religious service: Not on file    Active member of club or organization: Not on file    Attends meetings of clubs or organizations: Not on file    Relationship status: Not on file  . Intimate partner violence:    Fear of current or ex partner: Not on file    Emotionally abused: Not on file    Physically abused: Not on file    Forced sexual activity: Not on file  Other Topics Concern  . Not on file  Social History Narrative   Regular exercise-yes---aerobics, Pilates, jogged in past (now walks)      Has living will   Husband, then one of her children, has health care POA   Would accept resuscitation but no prolonged artificial ventilation   Probably would not want tube feeds if cognitively unaware   Review of Systems Appetite is fine Lost some weight---happier at current weight (tried to lose it) Sleeps well Wears seat belt Keeps up with dentist--no tooth problems Bowels are fine--no blood No urinary problems Mild back aches at times---is careful. No major arthritis problems (achy right shoulder at times) Occasional heartburn if eats late---uses tums with success. No dysphagia Had crack on side of mouth---HC cream really helped    Objective:   Physical Exam  Constitutional: She is oriented to person, place, and time. She appears well-developed. No distress.  HENT:  Mouth/Throat: Oropharynx is clear and moist. No oropharyngeal exudate.  Neck: No thyromegaly present.  Cardiovascular: Normal rate, regular rhythm, normal heart sounds and intact distal pulses. Exam reveals no gallop.  No murmur heard. Pulmonary/Chest: Effort  normal and breath sounds normal. No respiratory distress. She has no wheezes. She has no rales.  Abdominal: Soft. There is no tenderness.  Musculoskeletal: She exhibits no edema or tenderness.  Lymphadenopathy:    She has no cervical adenopathy.  Neurological: She is alert and oriented to person, place, and time.  President--- "Dwaine Deter, [place]" [phone] D-l-r-o-w Recall 3/3  Skin: Skin is warm. No rash noted.  Psychiatric: She has a normal mood and affect. Her behavior is normal.          Assessment & Plan:

## 2017-09-23 NOTE — Assessment & Plan Note (Signed)
See social history 

## 2017-09-23 NOTE — Addendum Note (Signed)
Addended by: Pilar Grammes on: 09/23/2017 09:58 AM   Modules accepted: Orders

## 2017-09-23 NOTE — Assessment & Plan Note (Addendum)
Will start prolia if any fracture Consider re-treatment ibandronate in 3-5 years otherwise

## 2017-09-23 NOTE — Assessment & Plan Note (Signed)
I have personally reviewed the Medicare Annual Wellness questionnaire and have noted 1. The patient's medical and social history 2. Their use of alcohol, tobacco or illicit drugs 3. Their current medications and supplements 4. The patient's functional ability including ADL's, fall risks, home safety risks and hearing or visual             impairment. 5. Diet and physical activities 6. Evidence for depression or mood disorders  The patients weight, height, BMI and visual acuity have been recorded in the chart I have made referrals, counseling and provided education to the patient based review of the above and I have provided the pt with a written personalized care plan for preventive services.  I have provided you with a copy of your personalized plan for preventive services. Please take the time to review along with your updated medication list.  Pneumovax booster today Will get 2nd shingrix soon Discussed exercise Colon due in 1 year (should be last one) Keeps up with mammograms due to daughter's breast cancer

## 2017-09-23 NOTE — Progress Notes (Signed)
Hearing Screening (Inadequate exam)   Method: Audiometry   125Hz  250Hz  500Hz  1000Hz  2000Hz  3000Hz  4000Hz  6000Hz  8000Hz   Right ear:   40 0 40  0    Left ear:   40 40 0  0      Visual Acuity Screening   Right eye Left eye Both eyes  Without correction:     With correction: 20/20 20/70 20/20

## 2017-09-24 LAB — VALPROIC ACID LEVEL: VALPROIC ACID LVL: 71 mg/L (ref 50.0–100.0)

## 2018-03-12 ENCOUNTER — Other Ambulatory Visit: Payer: Self-pay | Admitting: Internal Medicine

## 2018-03-12 DIAGNOSIS — Z1231 Encounter for screening mammogram for malignant neoplasm of breast: Secondary | ICD-10-CM

## 2018-04-19 ENCOUNTER — Ambulatory Visit
Admission: RE | Admit: 2018-04-19 | Discharge: 2018-04-19 | Disposition: A | Discharge status: Home / Self Care | Payer: Medicare Other | Source: Ambulatory Visit | Attending: Internal Medicine | Admitting: Internal Medicine

## 2018-04-19 DIAGNOSIS — Z1231 Encounter for screening mammogram for malignant neoplasm of breast: Secondary | ICD-10-CM

## 2018-04-19 IMAGING — MG DIGITAL SCREENING BILATERAL MAMMOGRAM WITH TOMO AND CAD
8 series · 9 of 24 positions shown · non-contrast
Comparison: Previous exam(s).

CLINICAL DATA: Screening.

EXAM:
DIGITAL SCREENING BILATERAL MAMMOGRAM WITH TOMO AND CAD

[L CC synth-2D]
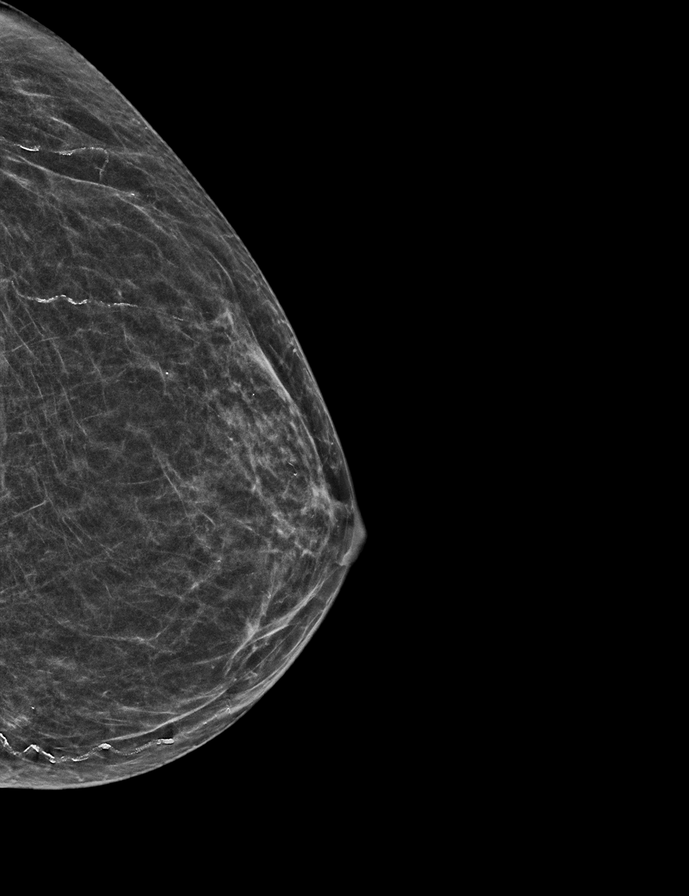

[L MLO synth-2D]
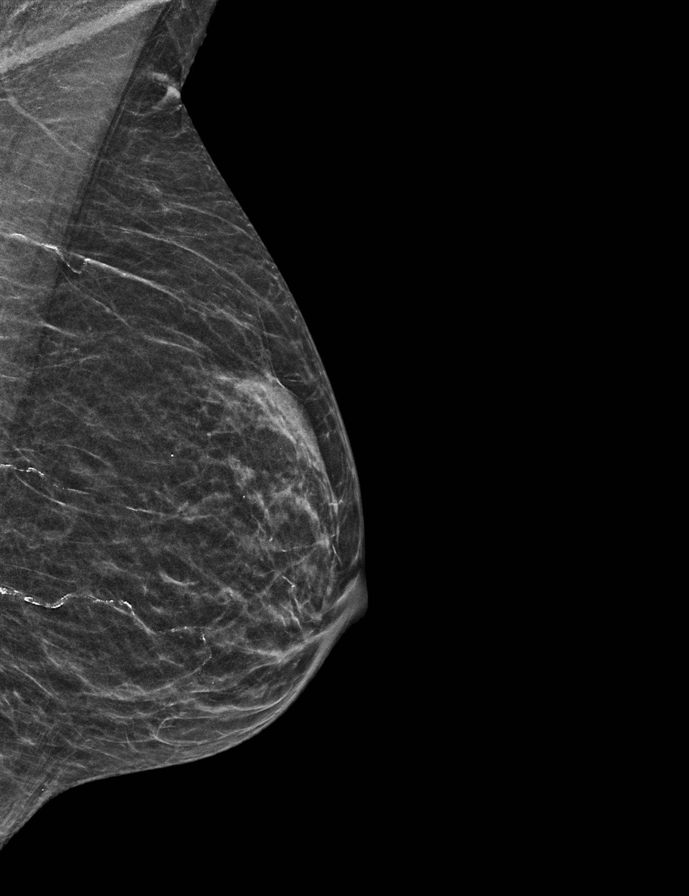

[R MLO synth-2D]
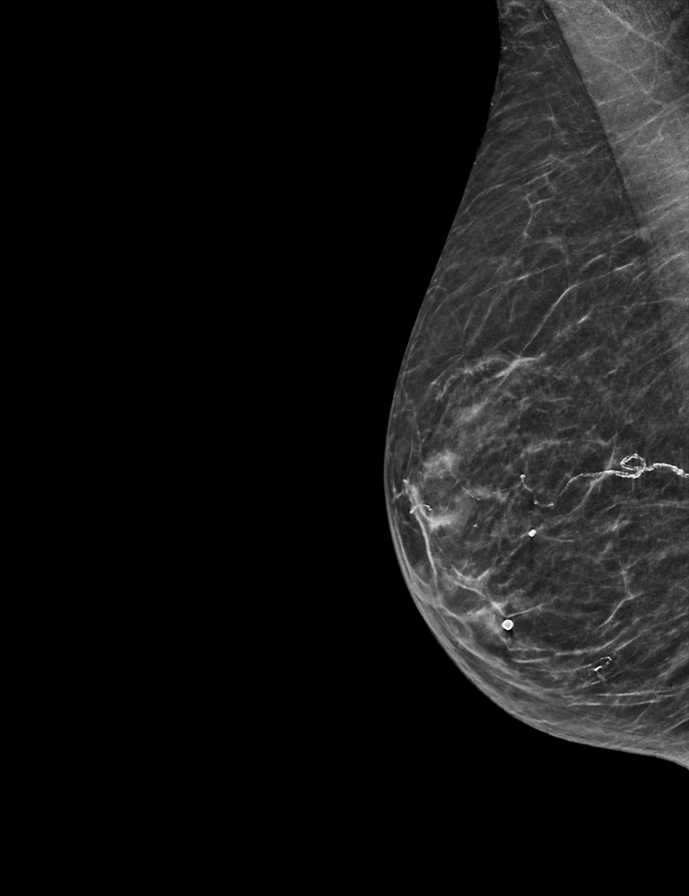

[R CC synth-2D]
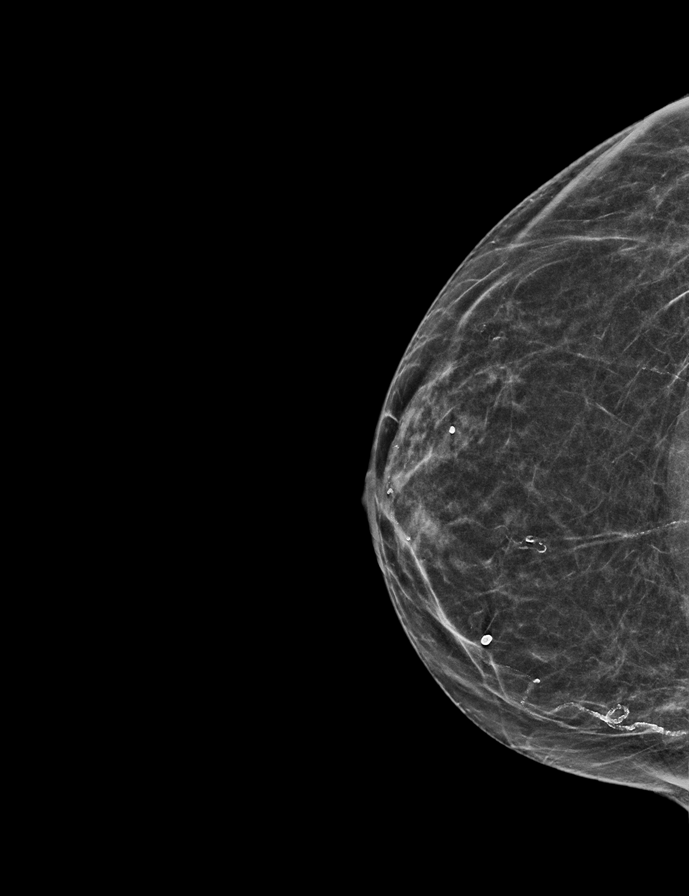

[L CC tomo · 2 of 42 frames shown]
[frame 14/42]
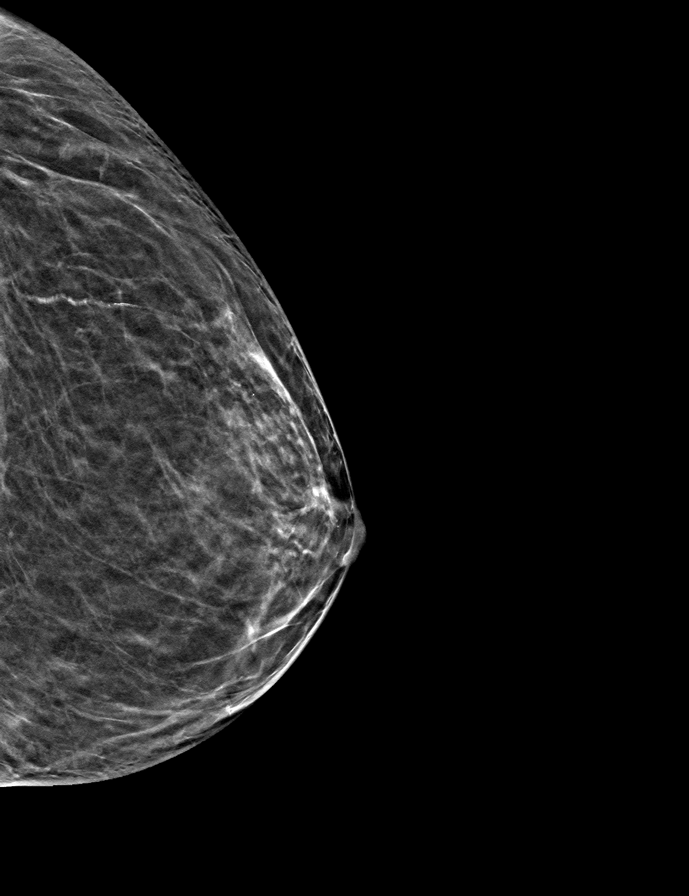
[frame 21/42]
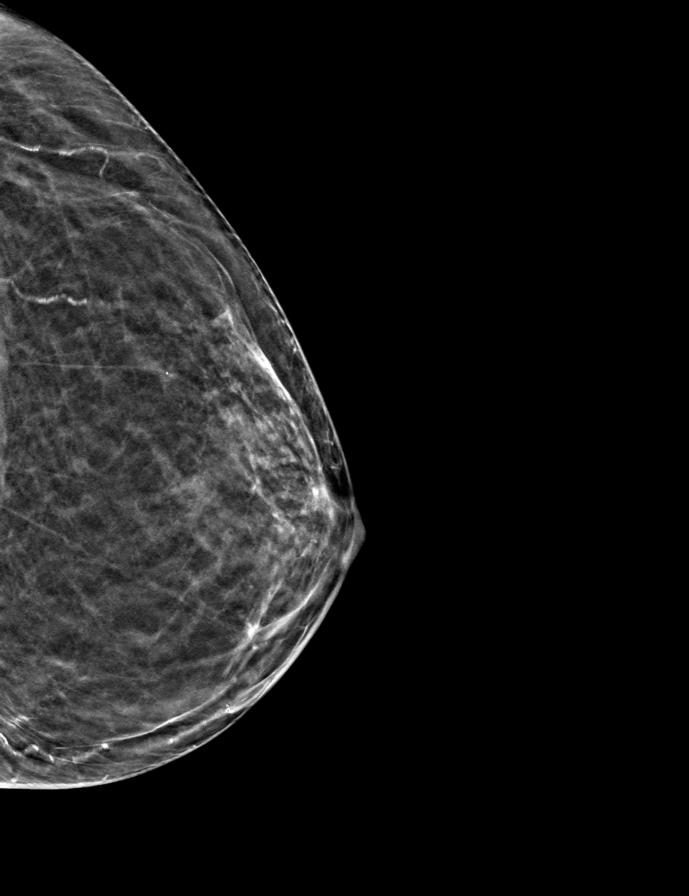

[R MLO tomo · tomo slice 23/46.0]
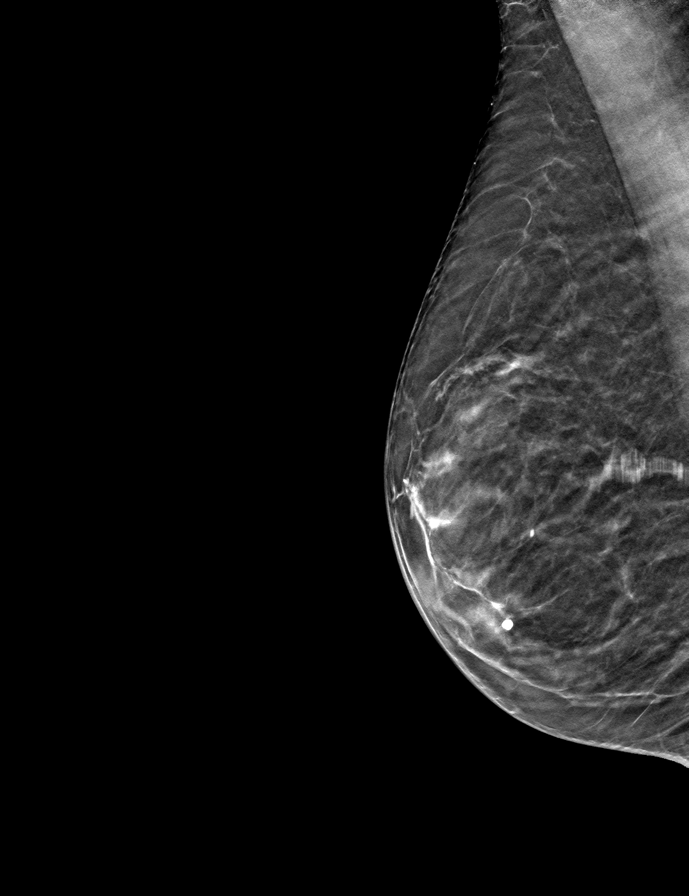

[L MLO tomo · tomo slice 21/41.0]
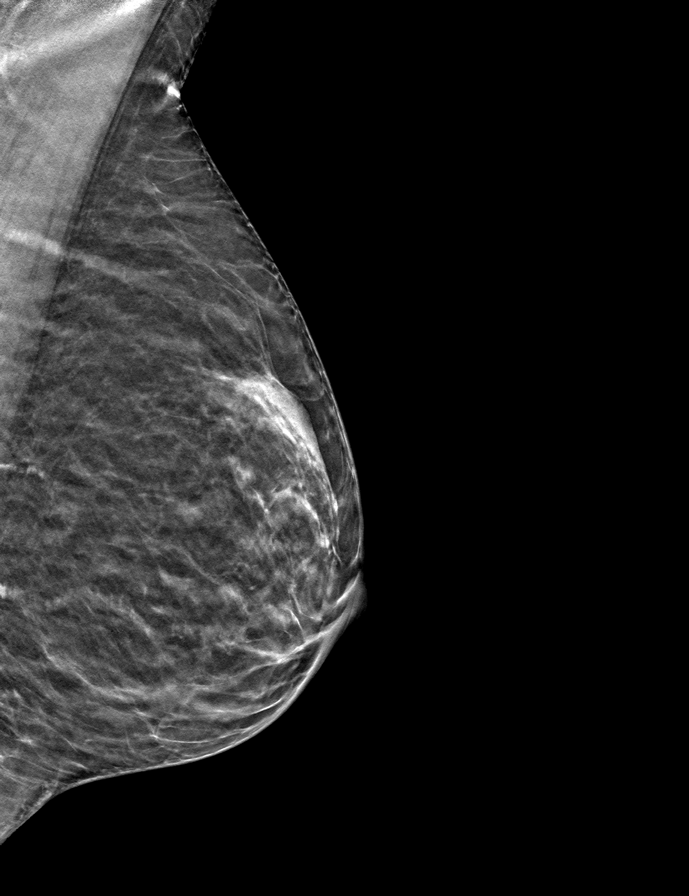

[R CC tomo · tomo slice 24/47.0]
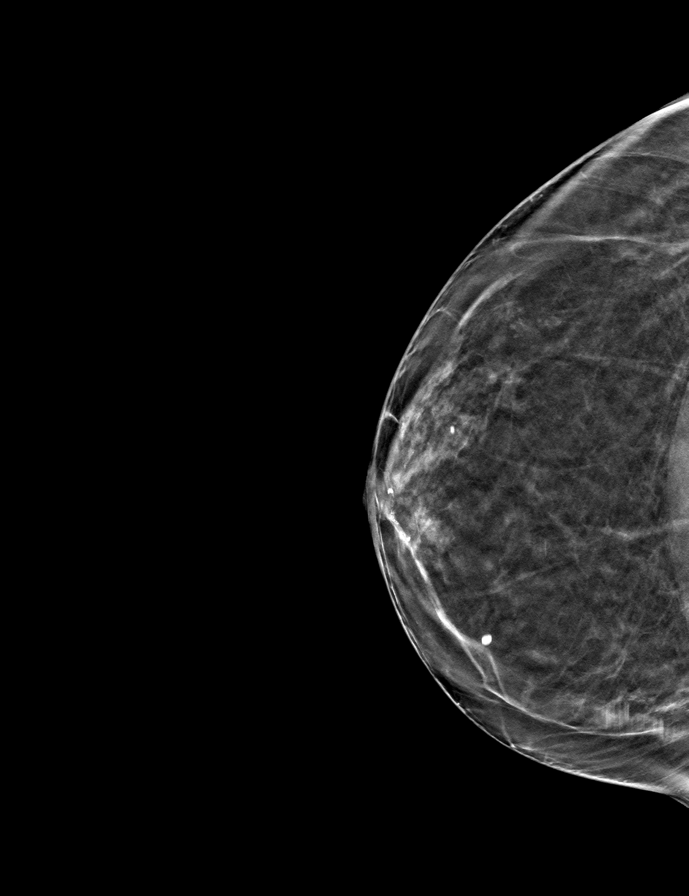

[9 of 24 positions shown; findings below may reference images not displayed]

ACR Breast Density Category b: There are scattered areas of
fibroglandular density.
FINDINGS: There are no findings suspicious for malignancy. Images were
processed with CAD.
IMPRESSION: No mammographic evidence of malignancy. A result letter of this
screening mammogram will be mailed directly to the patient.

RECOMMENDATION:
Screening mammogram in one year. (Code:[TQ])

BI-RADS CATEGORY  1: Negative.

## 2018-09-15 ENCOUNTER — Other Ambulatory Visit: Payer: Self-pay | Admitting: Internal Medicine

## 2018-09-20 ENCOUNTER — Encounter: Payer: Self-pay | Admitting: Gastroenterology

## 2018-09-29 ENCOUNTER — Encounter: Payer: Medicare Other | Admitting: Internal Medicine

## 2018-10-06 ENCOUNTER — Encounter: Payer: Medicare Other | Admitting: Internal Medicine

## 2018-12-14 ENCOUNTER — Telehealth: Payer: Self-pay | Admitting: Internal Medicine

## 2018-12-14 NOTE — Telephone Encounter (Signed)
Thank you :)

## 2018-12-14 NOTE — Telephone Encounter (Signed)
I gave her a 90 day supply. She is past due for her [facility] Exam. Can you get her scheduled? Thanks!

## 2018-12-14 NOTE — Telephone Encounter (Signed)
I spoke to patient's husband and he'll ask patient to call back and schedule physical when she gets home.

## 2019-03-11 ENCOUNTER — Other Ambulatory Visit: Payer: Self-pay | Admitting: Internal Medicine

## 2019-03-11 DIAGNOSIS — Z1231 Encounter for screening mammogram for malignant neoplasm of breast: Secondary | ICD-10-CM

## 2019-03-14 ENCOUNTER — Ambulatory Visit (INDEPENDENT_AMBULATORY_CARE_PROVIDER_SITE_OTHER): Payer: Medicare Other | Admitting: Internal Medicine

## 2019-03-14 ENCOUNTER — Encounter: Payer: Self-pay | Admitting: Internal Medicine

## 2019-03-14 ENCOUNTER — Other Ambulatory Visit: Payer: Self-pay

## 2019-03-14 VITALS — BP 138/82 | HR 61 | Temp 97.4°F | Ht 61.0 in | Wt 126.8 lb

## 2019-03-14 DIAGNOSIS — Z0000 Encounter for general adult medical examination without abnormal findings: Secondary | ICD-10-CM | POA: Diagnosis not present

## 2019-03-14 DIAGNOSIS — Z7189 Other specified counseling: Secondary | ICD-10-CM

## 2019-03-14 DIAGNOSIS — G40909 Epilepsy, unspecified, not intractable, without status epilepticus: Secondary | ICD-10-CM | POA: Diagnosis not present

## 2019-03-14 DIAGNOSIS — K219 Gastro-esophageal reflux disease without esophagitis: Secondary | ICD-10-CM | POA: Diagnosis not present

## 2019-03-14 DIAGNOSIS — I10 Essential (primary) hypertension: Secondary | ICD-10-CM

## 2019-03-14 DIAGNOSIS — Z1211 Encounter for screening for malignant neoplasm of colon: Secondary | ICD-10-CM

## 2019-03-14 LAB — CBC
HCT: 41.5 % (ref 36.0–46.0)
Hemoglobin: 13.9 g/dL (ref 12.0–15.0)
MCHC: 33.6 g/dL (ref 30.0–36.0)
MCV: 92.7 fl (ref 78.0–100.0)
Platelets: 273 10*3/uL (ref 150.0–400.0)
RBC: 4.48 Mil/uL (ref 3.87–5.11)
RDW: 14 % (ref 11.5–15.5)
WBC: 5.8 10*3/uL (ref 4.0–10.5)

## 2019-03-14 LAB — COMPREHENSIVE METABOLIC PANEL
ALT: 15 U/L (ref 0–35)
AST: 22 U/L (ref 0–37)
Albumin: 4.7 g/dL (ref 3.5–5.2)
Alkaline Phosphatase: 58 U/L (ref 39–117)
BUN: 15 mg/dL (ref 6–23)
CO2: 28 mEq/L (ref 19–32)
Calcium: 10.1 mg/dL (ref 8.4–10.5)
Chloride: 96 mEq/L (ref 96–112)
Creatinine, Ser: 0.6 mg/dL (ref 0.40–1.20)
GFR: 96.66 mL/min (ref >60.00)
Glucose, Bld: 90 mg/dL (ref 70–99)
Potassium: 4.7 mEq/L (ref 3.5–5.1)
Sodium: 132 mEq/L — ABNORMAL LOW (ref 135–145)
Total Bilirubin: 0.5 mg/dL (ref 0.2–1.2)
Total Protein: 7.3 g/dL (ref 6.0–8.3)

## 2019-03-14 MED ORDER — LOSARTAN POTASSIUM 100 MG PO TABS: ORAL_TABLET | ORAL | 0 refills | Status: DC

## 2019-03-14 MED ORDER — DIVALPROEX SODIUM 500 MG PO DR TAB: DELAYED_RELEASE_TABLET | ORAL | 3 refills | Status: DC

## 2019-03-14 MED ORDER — AMLODIPINE BESYLATE 2.5 MG PO TABS: ORAL_TABLET | ORAL | 0 refills | Status: DC

## 2019-03-14 MED ORDER — METOPROLOL SUCCINATE ER 50 MG PO TB24: ORAL_TABLET | ORAL | 0 refills | Status: DC

## 2019-03-14 NOTE — Assessment & Plan Note (Signed)
Only rare symptoms now

## 2019-03-14 NOTE — Assessment & Plan Note (Signed)
I have personally reviewed the Medicare Annual Wellness questionnaire and have noted  1. The patient's medical and social history  2. Their use of alcohol, tobacco or illicit drugs  3. Their current medications and supplements  4. The patient's functional ability including ADL's, fall risks, home safety risks and hearing or visual              impairment.  5. Diet and physical activities  6. Evidence for depression or mood disorders  The patients weight, height, BMI and visual acuity have been recorded in the chart  I have made referrals, counseling and provided education to the patient based review of the above and I have provided the pt with a written personalized care plan for preventive services.   I have provided you with a copy of your personalized plan for preventive services. Please take the time to review along with your updated medication list.  May have had Td booster--will check. Otherwise, booster if injury Had shingrix and flu vaccine this year Exercises regularly Prefers no last colon--will do FIT Has appt for mammogram

## 2019-03-14 NOTE — Assessment & Plan Note (Signed)
None Continues on depakote Will check labs

## 2019-03-14 NOTE — Progress Notes (Signed)
Subjective:    Patient ID: Tanya Knight, female    DOB: [birth year], 78 y.o.   MRN: [id number]  HPI Here for Medicare wellness visit and follow up of chronic health conditions Reviewed form and advanced directives Reviewed other doctors No tobacco Rare drink of alcohol Vision is fine Hearing is not great---- not ready for hearing aide Exercises regularly--but not [facility] to [facility] No falls No depression or anhedonia Independent with instrumental ADLs No sig memory issues  Feeling well No seizures on the depakote  Monitors BP periodically Always is fine---under 140/90 No significant edema--occ mild swelling in evening if up all day---but gone in the morning No chest pain or SOB No dizziness or syncope  No recent heartburn--unless she eats late at night No dysphagia  Current Outpatient Medications on File Prior to Visit  Medication Sig Dispense Refill  . amLODipine (NORVASC) 2.5 MG tablet TAKE 1 TABLET(2.5 MG) BY MOUTH DAILY 90 tablet 0  . aspirin 81 MG tablet Take 81 mg by mouth daily.      . cholecalciferol (VITAMIN D) 1000 units tablet Take 1,000 Units by mouth daily.    . divalproex (DEPAKOTE) 500 MG DR tablet TAKE 1 TABLET(500 MG) BY MOUTH TWICE DAILY 180 tablet 0  . losartan (COZAAR) 100 MG tablet TAKE 1 TABLET(100 MG) BY MOUTH DAILY 90 tablet 0  . metoprolol succinate (TOPROL-XL) 50 MG 24 hr tablet TAKE 1 TABLET BY MOUTH DAILY WITH OR IMMEDIATELY FOLLOWING A MEAL 90 tablet 0  . Multiple Vitamin (MULTIVITAMIN) capsule Take 1 capsule by mouth daily.      . Multiple Vitamins-Minerals (OCUVITE PO) Take by mouth.       No current facility-administered medications on file prior to visit.     Allergies  Allergen Reactions  . Phenytoin     Other reaction(s): Other (See Comments) Other Reaction: Other reaction REACTION: severe reaction    Past Medical History:  Diagnosis Date  . Fracture of metatarsal    Repair of fractured R metatarsal --Dr Duda---4/08  . GERD  (gastroesophageal reflux disease)   . Hypertension   . Melanoma in situ [facility]) 7/17   Dr Kellie Moor  . Osteoporosis   . Seizure disorder ([place])   . Seizures ([place])   . Vitamin D deficiency     Past Surgical History:  Procedure Laterality Date  . CATARACT EXTRACTION, BILATERAL Bilateral 2014  . EYE SURGERY     Obstructed tear duct  . FOOT SURGERY  2008  . MELANOMA EXCISION Left 11/2015   left lower leg  . TEAR DUCT PROBING  10/12   temporary stent  . TONSILLECTOMY AND ADENOIDECTOMY  1948    Family History  Problem Relation Age of Onset  . Hypertension Mother   . Heart disease Father   . Breast cancer Daughter 110  . Diabetes Maternal Aunt   . Coronary artery disease Paternal Aunt   . Breast cancer Paternal Aunt   . Coronary artery disease Paternal Uncle   . Heart disease Maternal Grandmother   . Heart disease Maternal Grandfather   . Heart disease Paternal Grandmother   . Heart disease Paternal Grandfather   . Colon cancer Neg Hx   . Stomach cancer Neg Hx   . Pancreatic cancer Neg Hx   . Rectal cancer Neg Hx     Social History   Socioeconomic History  . Marital status: Married    Spouse name: Not on file  . Number of children: 3  . Years  of education: Not on file  . Highest education level: Not on file  Occupational History  . Occupation: retired Financial planner: retired  Scientific laboratory technician  . Financial resource strain: Not on file  . Food insecurity    Worry: Not on file    Inability: Not on file  . Transportation needs    Medical: Not on file    Non-medical: Not on file  Tobacco Use  . Smoking status: Never Smoker  . Smokeless tobacco: Never Used  Substance and Sexual Activity  . Alcohol use: Yes    Alcohol/week: 0.0 standard drinks    Comment: occasional  . Drug use: No  . Sexual activity: Yes    Birth control/protection: Post-menopausal  Lifestyle  . Physical activity    Days per week: Not on file    Minutes per session: Not on file  .  Stress: Not on file  Relationships  . Social Herbalist on phone: Not on file    Gets together: Not on file    Attends religious service: Not on file    Active member of club or organization: Not on file    Attends meetings of clubs or organizations: Not on file    Relationship status: Not on file  . Intimate partner violence    Fear of current or ex partner: Not on file    Emotionally abused: Not on file    Physically abused: Not on file    Forced sexual activity: Not on file  Other Topics Concern  . Not on file  Social History Narrative   Regular exercise-yes---aerobics, Pilates, jogged in past (now walks)      Has living will   Husband, then one of her children, has health care POA   Would accept resuscitation but no prolonged artificial ventilation   Probably would not want tube feeds if cognitively unaware   Review of Systems Appetite is good Weight is up from last year--but not over what she has been in the past (at home more, etc) Sleeps well Teeth are fine---keeps up with dentist Wears seat belt Bowels are fine---no blood Voids fine--no incontinence Some back pain---"if I overdo". Some right shoulder pain at times also No suspicious skin lesions now. Did have skin cancer taken off leg last month    Objective:   Physical Exam  Constitutional: She is oriented to person, place, and time. She appears well-developed. No distress.  HENT:  Mouth/Throat: Oropharynx is clear and moist. No oropharyngeal exudate.  Neck: No thyromegaly present.  Cardiovascular: Normal rate, regular rhythm, normal heart sounds and intact distal pulses. Exam reveals no gallop.  No murmur heard. Respiratory: Effort normal and breath sounds normal. No respiratory distress. She has no wheezes. She has no rales.  GI: Soft. There is no abdominal tenderness.  Musculoskeletal:        General: No tenderness or edema.  Lymphadenopathy:    She has no cervical adenopathy.  Neurological: She  is alert and oriented to person, place, and time.  President--- "Dwaine Deter, [place]" [phone] D-l-r-o-w Recall 3/3  Skin: No rash noted. No erythema.  Psychiatric: She has a normal mood and affect. Her behavior is normal.           Assessment & Plan:

## 2019-03-14 NOTE — Assessment & Plan Note (Signed)
BP Readings from Last 3 Encounters:  03/14/19 138/82  09/23/17 132/88  09/19/16 132/80   Good control

## 2019-03-14 NOTE — Assessment & Plan Note (Signed)
See social history 

## 2019-03-15 ENCOUNTER — Other Ambulatory Visit (INDEPENDENT_AMBULATORY_CARE_PROVIDER_SITE_OTHER): Payer: Medicare Other

## 2019-03-15 DIAGNOSIS — Z1211 Encounter for screening for malignant neoplasm of colon: Secondary | ICD-10-CM | POA: Diagnosis not present

## 2019-03-15 LAB — VALPROIC ACID LEVEL: Valproic Acid Lvl: 49 mg/L — ABNORMAL LOW (ref 50.0–100.0)

## 2019-03-15 LAB — FECAL OCCULT BLOOD, IMMUNOCHEMICAL: Fecal Occult Bld: NEGATIVE

## 2019-04-21 ENCOUNTER — Ambulatory Visit
Admission: RE | Admit: 2019-04-21 | Discharge: 2019-04-21 | Disposition: A | Discharge status: Home / Self Care | Payer: Medicare Other | Source: Ambulatory Visit | Attending: Internal Medicine | Admitting: Internal Medicine

## 2019-04-21 DIAGNOSIS — Z1231 Encounter for screening mammogram for malignant neoplasm of breast: Secondary | ICD-10-CM | POA: Diagnosis not present

## 2019-04-21 IMAGING — MG DIGITAL SCREENING BILAT W/ TOMO W/ CAD
8 series · 9 of 24 positions shown · non-contrast
Comparison: Previous exam(s).

CLINICAL DATA: Screening.

EXAM:
DIGITAL SCREENING BILATERAL MAMMOGRAM WITH TOMO AND CAD

[L CC synth-2D]
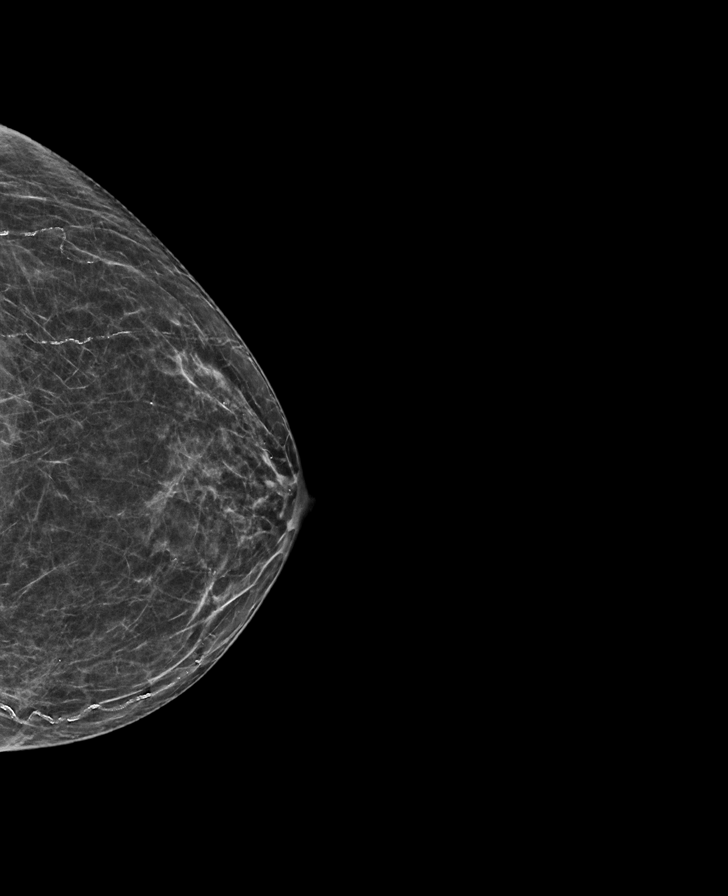

[L MLO synth-2D]
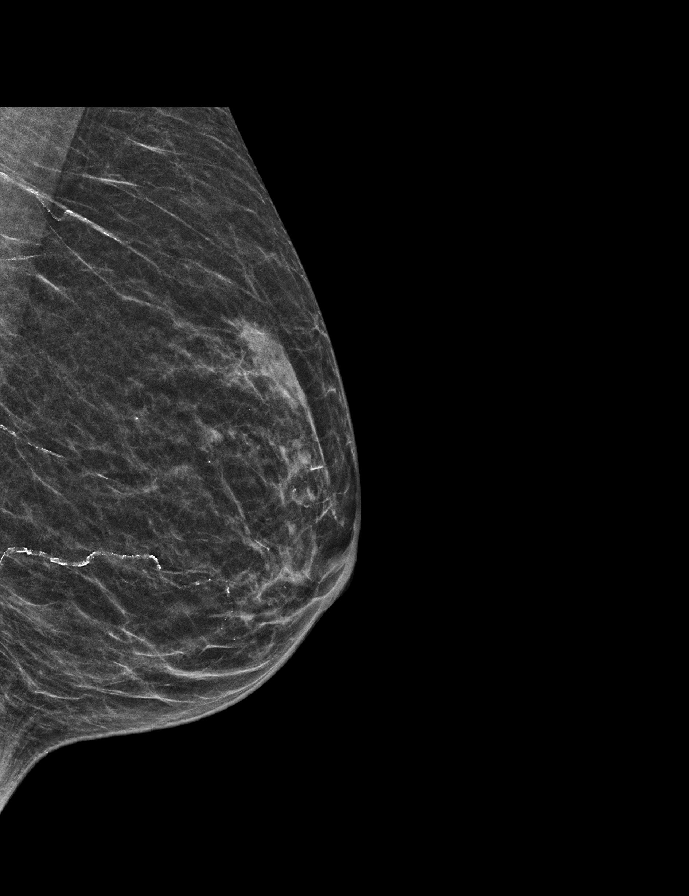

[R CC synth-2D]
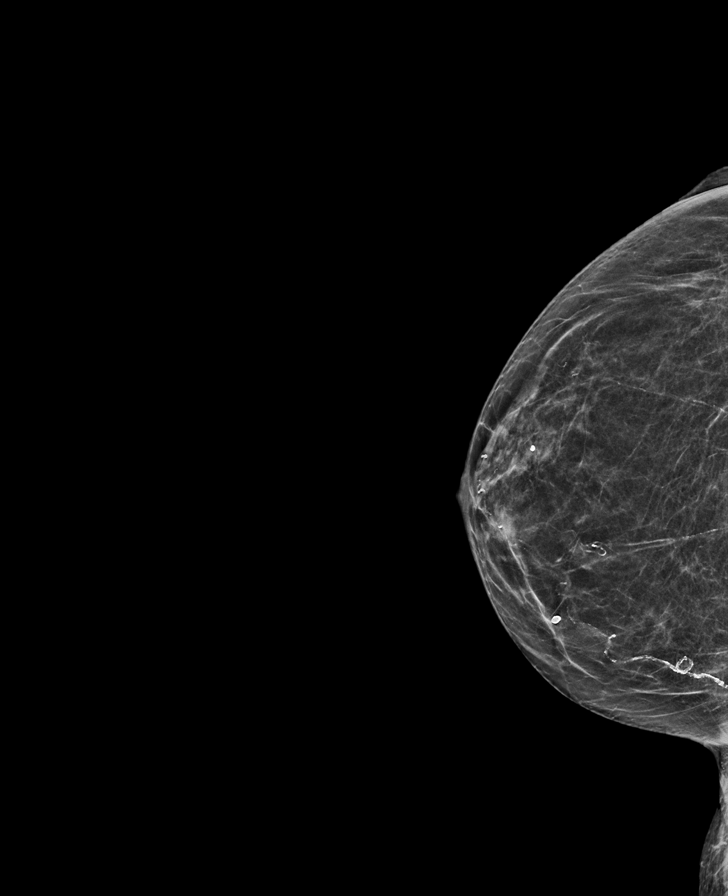

[R MLO synth-2D]
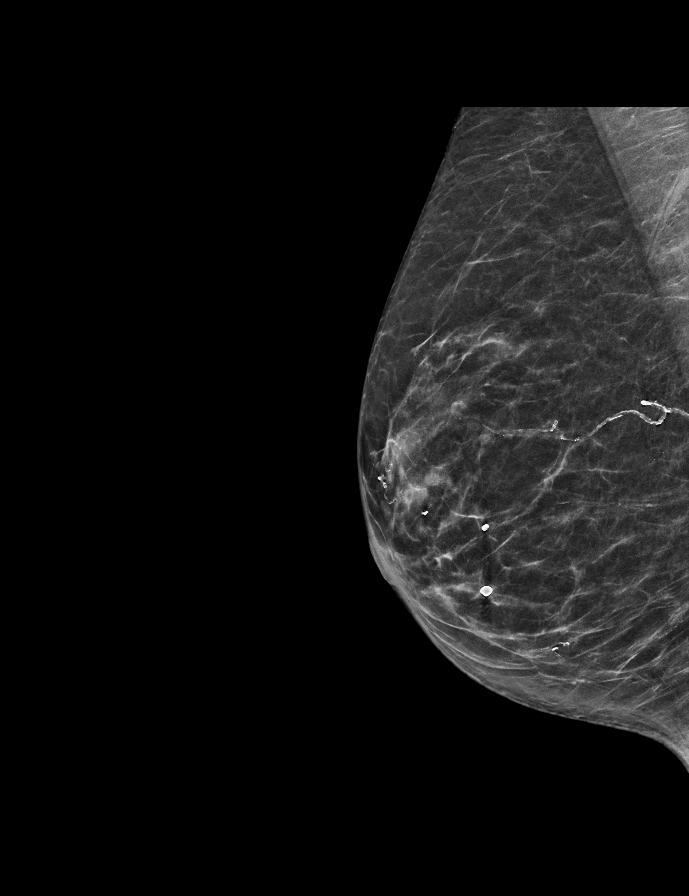

[R CC tomo · 2 of 52 frames shown]
[frame 17/52]
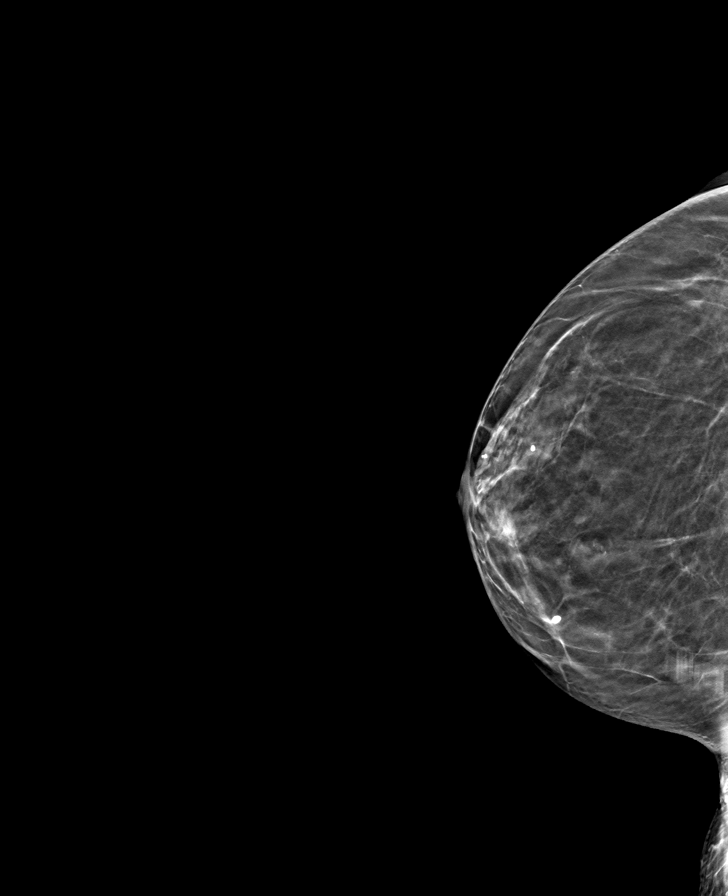
[frame 27/52]
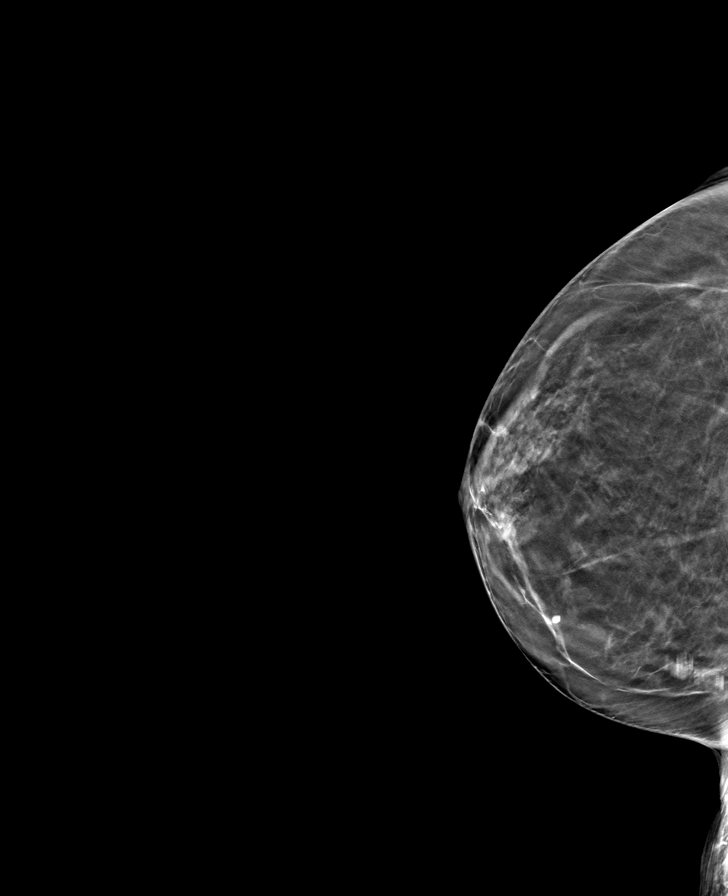

[R MLO tomo · tomo slice 29/56.0]
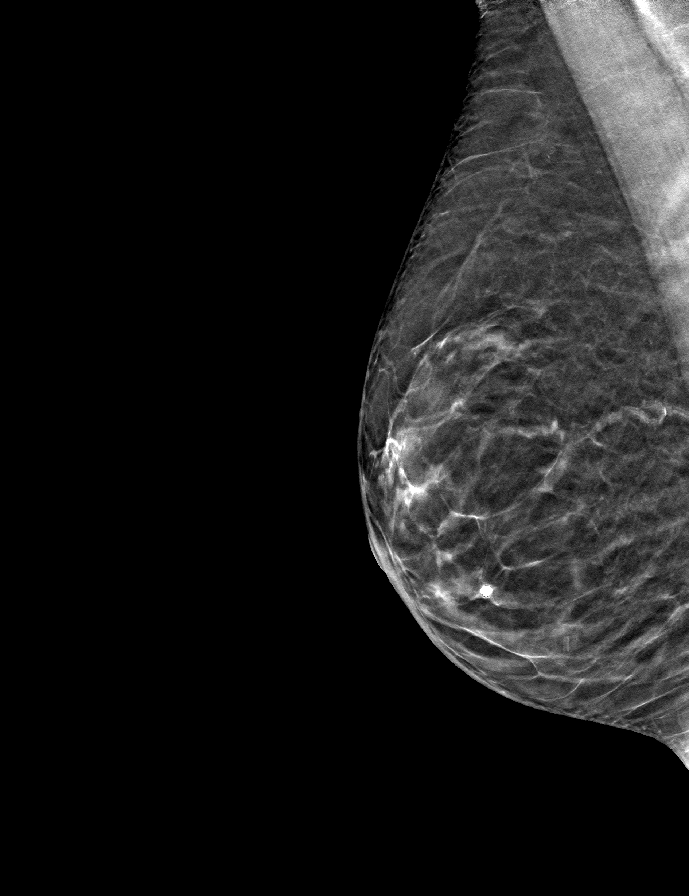

[L CC tomo · tomo slice 29/58.0]
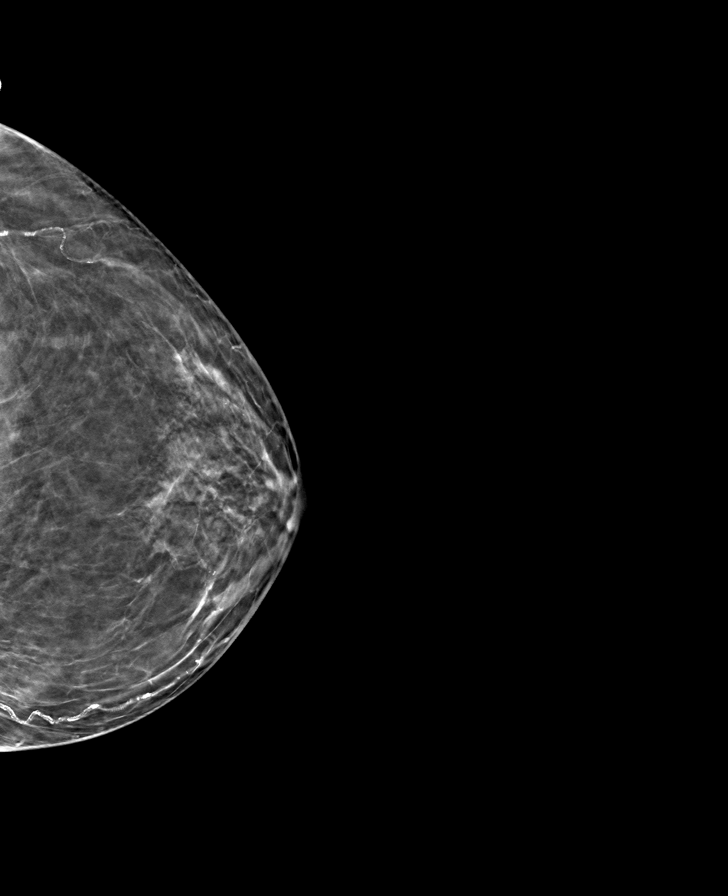

[L MLO tomo · tomo slice 23/44.0]
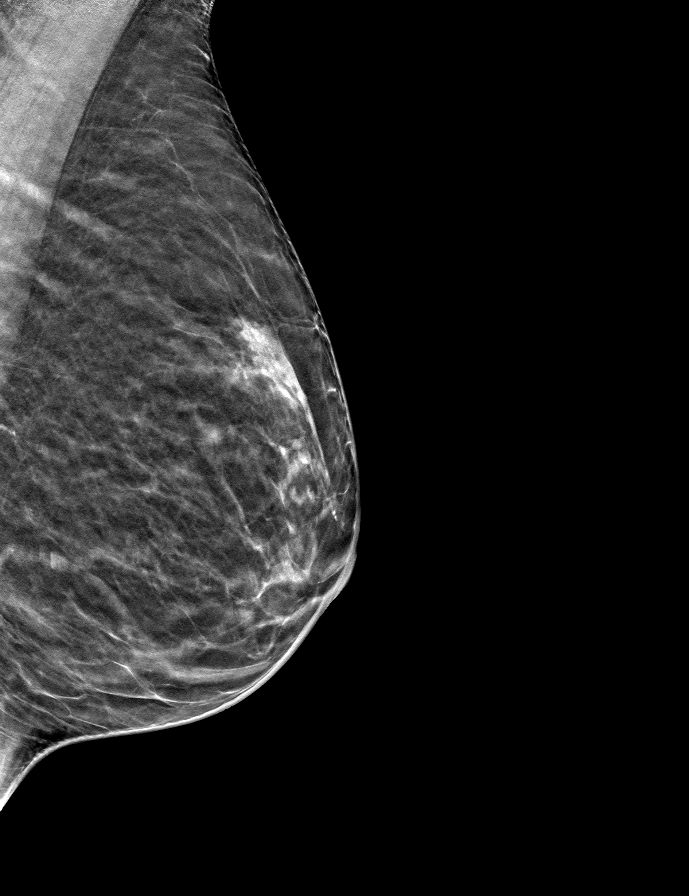

[9 of 24 positions shown; findings below may reference images not displayed]

ACR Breast Density Category b: There are scattered areas of
fibroglandular density.
FINDINGS: There are no findings suspicious for malignancy. Images were
processed with CAD.
IMPRESSION: No mammographic evidence of malignancy. A result letter of this
screening mammogram will be mailed directly to the patient.

RECOMMENDATION:
Screening mammogram in one year. (Code:[TQ])

BI-RADS CATEGORY  1: Negative.

## 2019-05-25 DIAGNOSIS — Z85828 Personal history of other malignant neoplasm of skin: Secondary | ICD-10-CM | POA: Diagnosis not present

## 2019-05-25 DIAGNOSIS — L718 Other rosacea: Secondary | ICD-10-CM | POA: Diagnosis not present

## 2019-05-25 DIAGNOSIS — Z85820 Personal history of malignant melanoma of skin: Secondary | ICD-10-CM | POA: Diagnosis not present

## 2019-05-25 DIAGNOSIS — L570 Actinic keratosis: Secondary | ICD-10-CM | POA: Diagnosis not present

## 2019-05-25 DIAGNOSIS — Z08 Encounter for follow-up examination after completed treatment for malignant neoplasm: Secondary | ICD-10-CM | POA: Diagnosis not present

## 2019-05-25 DIAGNOSIS — D485 Neoplasm of uncertain behavior of skin: Secondary | ICD-10-CM | POA: Diagnosis not present

## 2019-06-03 DIAGNOSIS — L570 Actinic keratosis: Secondary | ICD-10-CM | POA: Diagnosis not present

## 2019-06-10 ENCOUNTER — Emergency Department (HOSPITAL_COMMUNITY): Payer: Medicare PPO

## 2019-06-10 ENCOUNTER — Inpatient Hospital Stay (HOSPITAL_COMMUNITY): Payer: Medicare PPO

## 2019-06-10 ENCOUNTER — Inpatient Hospital Stay (HOSPITAL_COMMUNITY)
Admission: EM | Admit: 2019-06-10 | Discharge: 2019-06-14 | DRG: 064 | Disposition: A | Discharge status: Rehabilitation Facility | Payer: Medicare PPO | Attending: Neurology | Admitting: Neurology

## 2019-06-10 ENCOUNTER — Encounter (HOSPITAL_COMMUNITY): Payer: Self-pay | Admitting: Neurology

## 2019-06-10 ENCOUNTER — Other Ambulatory Visit: Payer: Self-pay

## 2019-06-10 DIAGNOSIS — Z79899 Other long term (current) drug therapy: Secondary | ICD-10-CM | POA: Diagnosis not present

## 2019-06-10 DIAGNOSIS — E876 Hypokalemia: Secondary | ICD-10-CM | POA: Diagnosis present

## 2019-06-10 DIAGNOSIS — Z7982 Long term (current) use of aspirin: Secondary | ICD-10-CM

## 2019-06-10 DIAGNOSIS — R270 Ataxia, unspecified: Secondary | ICD-10-CM | POA: Diagnosis present

## 2019-06-10 DIAGNOSIS — I161 Hypertensive emergency: Secondary | ICD-10-CM

## 2019-06-10 DIAGNOSIS — I69391 Dysphagia following cerebral infarction: Secondary | ICD-10-CM | POA: Diagnosis not present

## 2019-06-10 DIAGNOSIS — R52 Pain, unspecified: Secondary | ICD-10-CM | POA: Diagnosis not present

## 2019-06-10 DIAGNOSIS — G936 Cerebral edema: Secondary | ICD-10-CM | POA: Diagnosis present

## 2019-06-10 DIAGNOSIS — E559 Vitamin D deficiency, unspecified: Secondary | ICD-10-CM | POA: Diagnosis present

## 2019-06-10 DIAGNOSIS — R471 Dysarthria and anarthria: Secondary | ICD-10-CM | POA: Diagnosis not present

## 2019-06-10 DIAGNOSIS — I629 Nontraumatic intracranial hemorrhage, unspecified: Secondary | ICD-10-CM | POA: Diagnosis not present

## 2019-06-10 DIAGNOSIS — I10 Essential (primary) hypertension: Secondary | ICD-10-CM | POA: Diagnosis not present

## 2019-06-10 DIAGNOSIS — S065X9A Traumatic subdural hemorrhage with loss of consciousness of unspecified duration, initial encounter: Secondary | ICD-10-CM | POA: Diagnosis not present

## 2019-06-10 DIAGNOSIS — G40909 Epilepsy, unspecified, not intractable, without status epilepticus: Secondary | ICD-10-CM | POA: Diagnosis not present

## 2019-06-10 DIAGNOSIS — H5509 Other forms of nystagmus: Secondary | ICD-10-CM | POA: Diagnosis present

## 2019-06-10 DIAGNOSIS — Z888 Allergy status to other drugs, medicaments and biological substances status: Secondary | ICD-10-CM

## 2019-06-10 DIAGNOSIS — I6200 Nontraumatic subdural hemorrhage, unspecified: Secondary | ICD-10-CM | POA: Diagnosis not present

## 2019-06-10 DIAGNOSIS — Z8249 Family history of ischemic heart disease and other diseases of the circulatory system: Secondary | ICD-10-CM | POA: Diagnosis not present

## 2019-06-10 DIAGNOSIS — R569 Unspecified convulsions: Secondary | ICD-10-CM | POA: Diagnosis not present

## 2019-06-10 DIAGNOSIS — M810 Age-related osteoporosis without current pathological fracture: Secondary | ICD-10-CM | POA: Diagnosis not present

## 2019-06-10 DIAGNOSIS — Z85820 Personal history of malignant melanoma of skin: Secondary | ICD-10-CM

## 2019-06-10 DIAGNOSIS — R29703 NIHSS score 3: Secondary | ICD-10-CM | POA: Diagnosis present

## 2019-06-10 DIAGNOSIS — Z20822 Contact with and (suspected) exposure to covid-19: Secondary | ICD-10-CM | POA: Diagnosis not present

## 2019-06-10 DIAGNOSIS — K219 Gastro-esophageal reflux disease without esophagitis: Secondary | ICD-10-CM | POA: Diagnosis not present

## 2019-06-10 DIAGNOSIS — E222 Syndrome of inappropriate secretion of antidiuretic hormone: Secondary | ICD-10-CM | POA: Diagnosis not present

## 2019-06-10 DIAGNOSIS — I615 Nontraumatic intracerebral hemorrhage, intraventricular: Secondary | ICD-10-CM | POA: Diagnosis not present

## 2019-06-10 DIAGNOSIS — G8191 Hemiplegia, unspecified affecting right dominant side: Secondary | ICD-10-CM | POA: Diagnosis present

## 2019-06-10 DIAGNOSIS — I671 Cerebral aneurysm, nonruptured: Secondary | ICD-10-CM | POA: Diagnosis present

## 2019-06-10 DIAGNOSIS — W06XXXA Fall from bed, initial encounter: Secondary | ICD-10-CM | POA: Diagnosis present

## 2019-06-10 DIAGNOSIS — E785 Hyperlipidemia, unspecified: Secondary | ICD-10-CM

## 2019-06-10 DIAGNOSIS — R799 Abnormal finding of blood chemistry, unspecified: Secondary | ICD-10-CM | POA: Diagnosis not present

## 2019-06-10 DIAGNOSIS — H532 Diplopia: Secondary | ICD-10-CM | POA: Diagnosis present

## 2019-06-10 DIAGNOSIS — I610 Nontraumatic intracerebral hemorrhage in hemisphere, subcortical: Secondary | ICD-10-CM | POA: Diagnosis not present

## 2019-06-10 DIAGNOSIS — R531 Weakness: Secondary | ICD-10-CM | POA: Diagnosis not present

## 2019-06-10 DIAGNOSIS — I619 Nontraumatic intracerebral hemorrhage, unspecified: Secondary | ICD-10-CM | POA: Diagnosis not present

## 2019-06-10 DIAGNOSIS — I609 Nontraumatic subarachnoid hemorrhage, unspecified: Secondary | ICD-10-CM | POA: Diagnosis not present

## 2019-06-10 DIAGNOSIS — I614 Nontraumatic intracerebral hemorrhage in cerebellum: Secondary | ICD-10-CM | POA: Diagnosis not present

## 2019-06-10 DIAGNOSIS — Z87898 Personal history of other specified conditions: Secondary | ICD-10-CM | POA: Diagnosis not present

## 2019-06-10 DIAGNOSIS — Z7289 Other problems related to lifestyle: Secondary | ICD-10-CM

## 2019-06-10 DIAGNOSIS — R29810 Facial weakness: Secondary | ICD-10-CM | POA: Diagnosis not present

## 2019-06-10 DIAGNOSIS — G4489 Other headache syndrome: Secondary | ICD-10-CM | POA: Diagnosis not present

## 2019-06-10 DIAGNOSIS — G441 Vascular headache, not elsewhere classified: Secondary | ICD-10-CM | POA: Diagnosis not present

## 2019-06-10 DIAGNOSIS — R4781 Slurred speech: Secondary | ICD-10-CM | POA: Diagnosis not present

## 2019-06-10 DIAGNOSIS — I69293 Ataxia following other nontraumatic intracranial hemorrhage: Secondary | ICD-10-CM | POA: Diagnosis not present

## 2019-06-10 DIAGNOSIS — I6389 Other cerebral infarction: Secondary | ICD-10-CM

## 2019-06-10 LAB — RESPIRATORY PANEL BY RT PCR (FLU A&B, COVID)
Influenza A by PCR: NEGATIVE
Influenza B by PCR: NEGATIVE
SARS Coronavirus 2 by RT PCR: NEGATIVE

## 2019-06-10 LAB — CBC
HCT: 43.3 % (ref 36.0–46.0)
Hemoglobin: 14.4 g/dL (ref 12.0–15.0)
MCH: 29.9 pg (ref 26.0–34.0)
MCHC: 33.3 g/dL (ref 30.0–36.0)
MCV: 89.8 fL (ref 80.0–100.0)
Platelets: 299 10*3/uL (ref 150–400)
RBC: 4.82 MIL/uL (ref 3.87–5.11)
RDW: 13.5 % (ref 11.5–15.5)
WBC: 11.9 10*3/uL — ABNORMAL HIGH (ref 4.0–10.5)
nRBC: 0 % (ref 0.0–0.2)

## 2019-06-10 LAB — I-STAT CHEM 8, ED
BUN: 16 mg/dL (ref 8–23)
Calcium, Ion: 1.15 mmol/L (ref 1.15–1.40)
Chloride: 98 mmol/L (ref 98–111)
Creatinine, Ser: 0.4 mg/dL — ABNORMAL LOW (ref 0.44–1.00)
Glucose, Bld: 189 mg/dL — ABNORMAL HIGH (ref 70–99)
HCT: 47 % — ABNORMAL HIGH (ref 36.0–46.0)
Hemoglobin: 16 g/dL — ABNORMAL HIGH (ref 12.0–15.0)
Potassium: 3.3 mmol/L — ABNORMAL LOW (ref 3.5–5.1)
Sodium: 136 mmol/L (ref 135–145)
TCO2: 26 mmol/L (ref 22–32)

## 2019-06-10 LAB — COMPREHENSIVE METABOLIC PANEL
ALT: 17 U/L (ref 0–44)
AST: 30 U/L (ref 15–41)
Albumin: 4.4 g/dL (ref 3.5–5.0)
Alkaline Phosphatase: 60 U/L (ref 38–126)
Anion gap: 15 (ref 5–15)
BUN: 13 mg/dL (ref 8–23)
CO2: 23 mmol/L (ref 22–32)
Calcium: 9.8 mg/dL (ref 8.9–10.3)
Chloride: 96 mmol/L — ABNORMAL LOW (ref 98–111)
Creatinine, Ser: 0.64 mg/dL (ref 0.44–1.00)
GFR calc Af Amer: >60 mL/min (ref >60)
GFR calc non Af Amer: >60 mL/min (ref >60)
Glucose, Bld: 187 mg/dL — ABNORMAL HIGH (ref 70–99)
Potassium: 3.2 mmol/L — ABNORMAL LOW (ref 3.5–5.1)
Sodium: 134 mmol/L — ABNORMAL LOW (ref 135–145)
Total Bilirubin: 0.3 mg/dL (ref 0.3–1.2)
Total Protein: 7.4 g/dL (ref 6.5–8.1)

## 2019-06-10 LAB — DIFFERENTIAL
Abs Immature Granulocytes: 0.04 10*3/uL (ref 0.00–0.07)
Basophils Absolute: 0 10*3/uL (ref 0.0–0.1)
Basophils Relative: 0 %
Eosinophils Absolute: 0 10*3/uL (ref 0.0–0.5)
Eosinophils Relative: 0 %
Immature Granulocytes: 0 %
Lymphocytes Relative: 6 %
Lymphs Abs: 0.7 10*3/uL (ref 0.7–4.0)
Monocytes Absolute: 0.5 10*3/uL (ref 0.1–1.0)
Monocytes Relative: 4 %
Neutro Abs: 10.7 10*3/uL — ABNORMAL HIGH (ref 1.7–7.7)
Neutrophils Relative %: 90 %

## 2019-06-10 LAB — RAPID URINE DRUG SCREEN, HOSP PERFORMED
Amphetamines: NOT DETECTED
Barbiturates: NOT DETECTED
Benzodiazepines: NOT DETECTED
Cocaine: NOT DETECTED
Opiates: NOT DETECTED
Tetrahydrocannabinol: NOT DETECTED

## 2019-06-10 LAB — URINALYSIS, ROUTINE W REFLEX MICROSCOPIC
Bacteria, UA: NONE SEEN
Bilirubin Urine: NEGATIVE
Glucose, UA: >=500 mg/dL — AB
Hgb urine dipstick: NEGATIVE
Ketones, ur: NEGATIVE mg/dL
Leukocytes,Ua: NEGATIVE
Nitrite: NEGATIVE
Protein, ur: NEGATIVE mg/dL
Specific Gravity, Urine: 1.007 (ref 1.005–1.030)
pH: 7 (ref 5.0–8.0)

## 2019-06-10 LAB — CBG MONITORING, ED: Glucose-Capillary: 167 mg/dL — ABNORMAL HIGH (ref 70–99)

## 2019-06-10 LAB — PROTIME-INR
INR: 1 (ref 0.8–1.2)
Prothrombin Time: 12.6 seconds (ref 11.4–15.2)

## 2019-06-10 LAB — ECHOCARDIOGRAM COMPLETE
Height: 61 in
Weight: 1940.05 oz

## 2019-06-10 LAB — MRSA PCR SCREENING: MRSA by PCR: NEGATIVE

## 2019-06-10 LAB — ETHANOL: Alcohol, Ethyl (B): <10 mg/dL (ref <10)

## 2019-06-10 LAB — APTT: aPTT: 39 seconds — ABNORMAL HIGH (ref 24–36)

## 2019-06-10 IMAGING — CT CT ANGIO HEAD
3 of 9 series · 12 of 47 positions shown · IV contrast (omnipaque)
Comparison: Head CT [DATE]

CLINICAL DATA: Subarachnoid hemorrhage; cerebral hemorrhage
suspected.

EXAM:
CT ANGIOGRAPHY HEAD
TECHNIQUE: Multidetector CT imaging of the head was performed using the
standard protocol during bolus administration of intravenous
contrast. Multiplanar CT image reconstructions and MIPs were
obtained to evaluate the vascular anatomy.
CONTRAST:  75mL OMNIPAQUE IOHEXOL 350 MG/ML SOLN

[Series 5: headangio 2.0 hr36 3 · axial · 0.40mm/px · z∈[+1172,+1282]mm · 6 of 78 slices shown]
[im 12/78  brain]
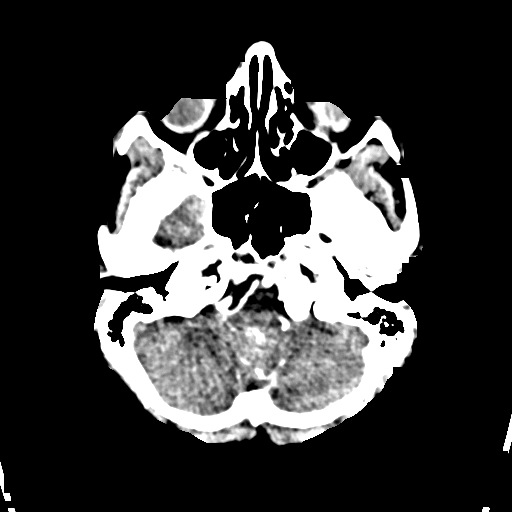
[im 23/78  bone]
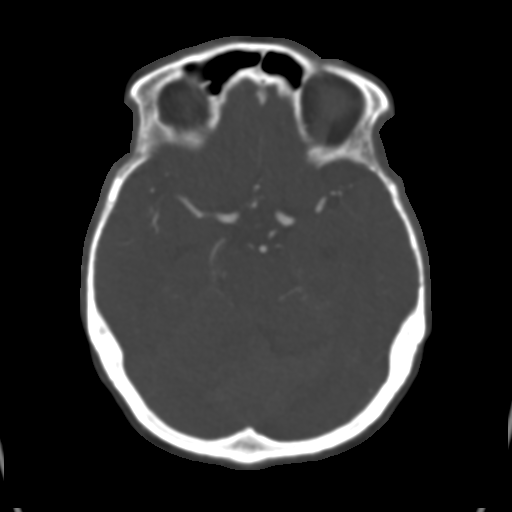
[im 34/78  brain]
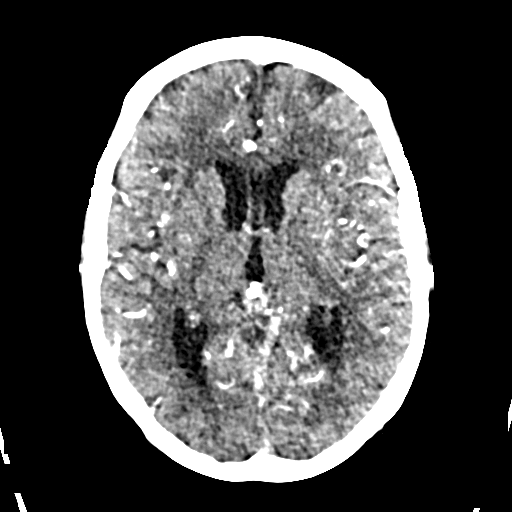
[im 45/78  bone]
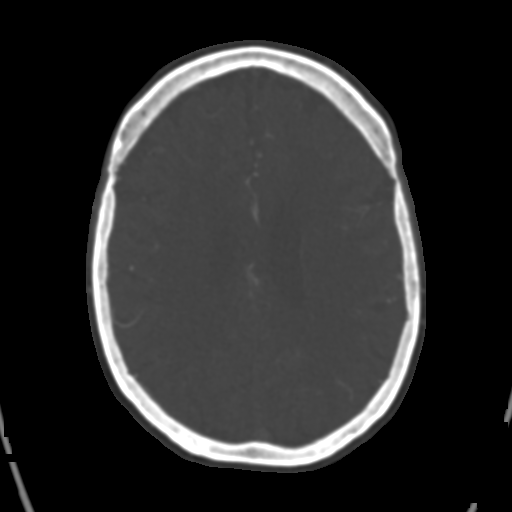
[im 56/78  brain]
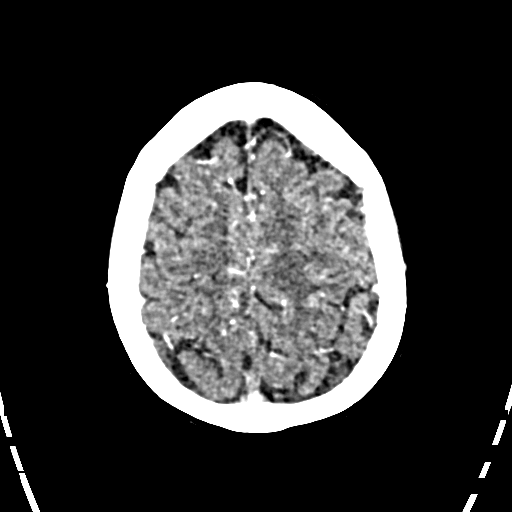
[im 67/78  bone]
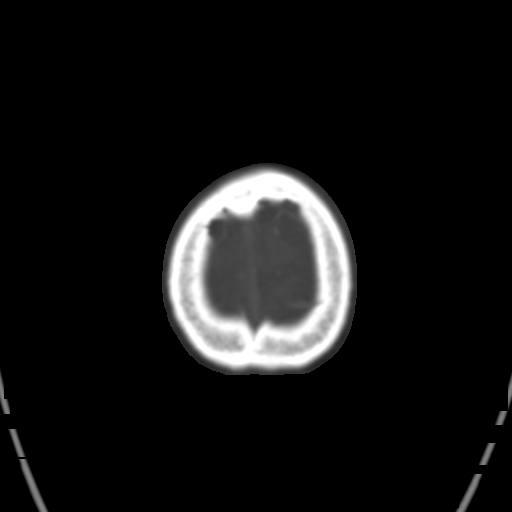

[Series 7: headangio 1.0 mpr cor · coronal · 0.30mm/px · 3 of 194 slices shown]
[im 56/194  brain]
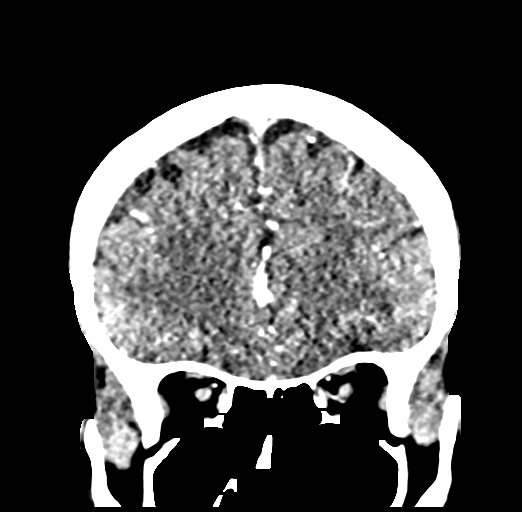
[im 83/194  brain]
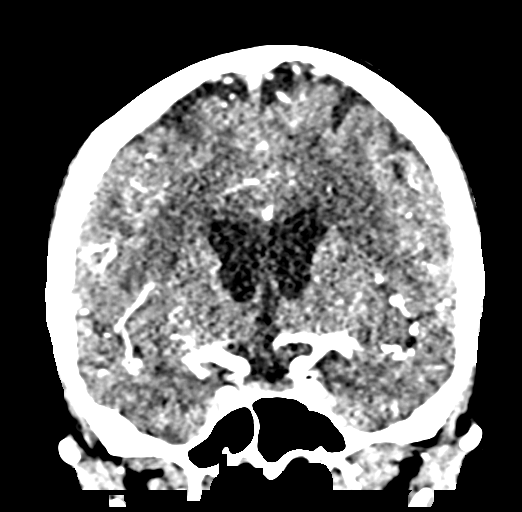
[im 111/194  brain]
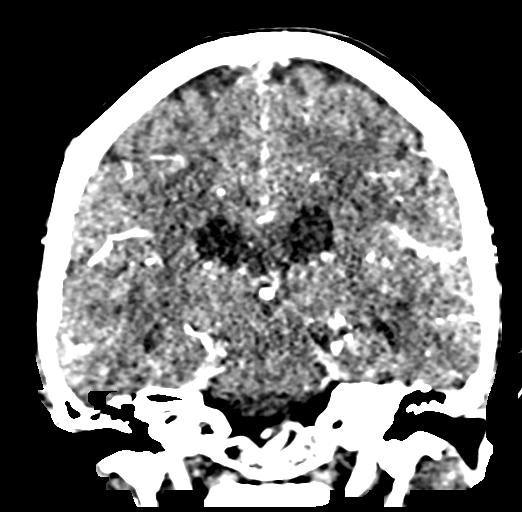

[Series 8: headangio 1.0 mpr sag · sagittal · 0.30mm/px · 3 of 166 slices shown]
[im 34/166  brain]
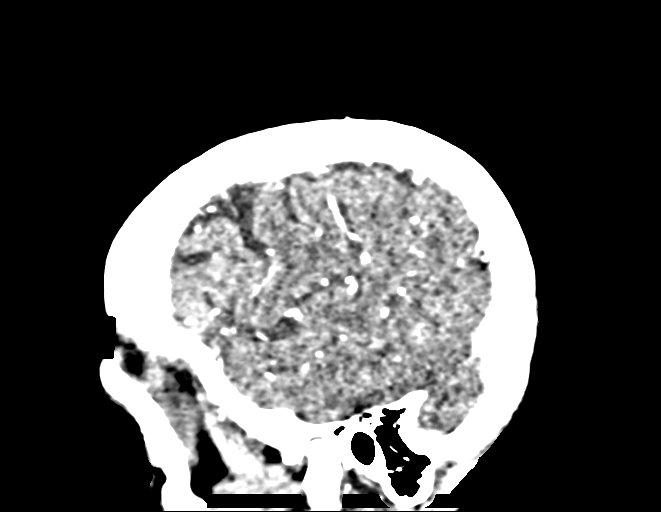
[im 67/166  brain]
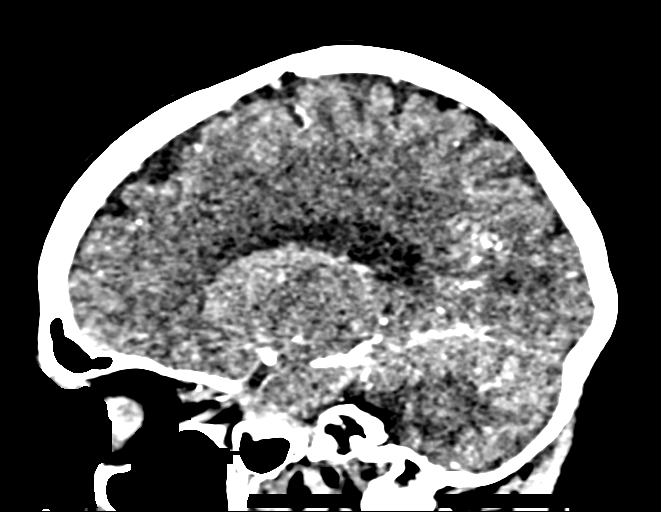
[im 100/166  brain]
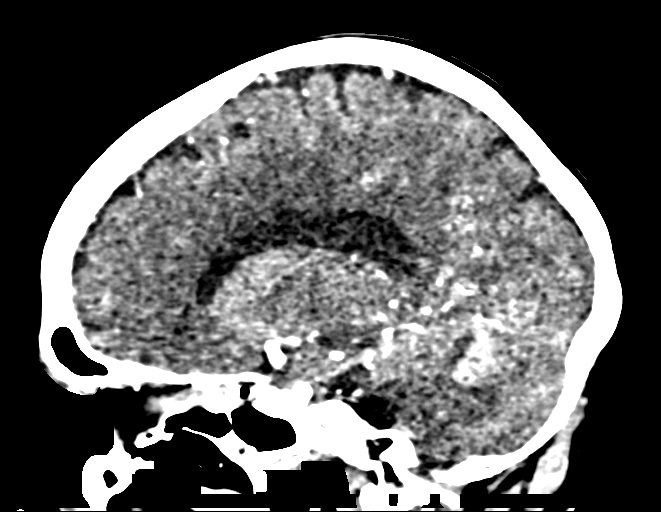

[12 of 47 positions shown; findings below may reference images not displayed]

FINDINGS: CTA HEAD

Anterior circulation:

The intracranial internal carotid arteries are patent bilaterally.
Scattered calcified plaque within these vessels but no significant
stenosis. The bilateral middle and anterior cerebral arteries are
patent without significant proximal stenosis. 2 mm inferiorly
projecting aneurysm arising from the supraclinoid right ICA (series
8, image 76) (series 12, image 108).

Posterior circulation:

The intracranial vertebral arteries are patent without significant
stenosis. The left vertebral artery is non dominant and
developmentally diminutive beyond he left vertebral artery the
origin of the left PICA. The basilar artery is patent without
significant stenosis. Flow is seen within the proximal PICA, AICA
and SCA vessels bilaterally. Predominantly fetal origin of the left
posterior cerebral artery. The bilateral posterior cerebral arteries
are patent without significant stenosis. No posterior fossa aneurysm
or AVM is identified. No contrast blush is demonstrated to suggest
active arterial extravasation.

Venous sinuses: Poorly assessed due to contrast timing.

Anatomic variants: As described.
IMPRESSION: No posterior fossa aneurysm or AVM is identified. Consider repeat
vascular imaging once the hematoma involutes for further assessment.

No intracranial large vessel occlusion or proximal high-grade
arterial stenosis.

2 mm supraclinoid right ICA aneurysm.

## 2019-06-10 IMAGING — CT CT HEAD CODE STROKE
3 of 4 series · 16 of 47 positions shown, 19 images · non-contrast
Comparison: None available

CLINICAL DATA: Code stroke. Right-sided weakness and slurred speech

EXAM:
CT HEAD WITHOUT CONTRAST
TECHNIQUE: Contiguous axial images were obtained from the base of the skull
through the vertex without intravenous contrast.

[Series 3: head 5.0 st · axial · 0.39mm/px · z∈[-151,-6]mm · 10 of 35 slices shown, 13 images]
[im 3/35  brain]
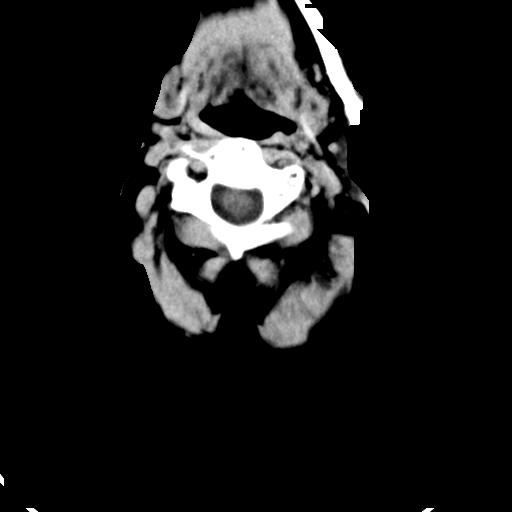
[im 3/35  bone]
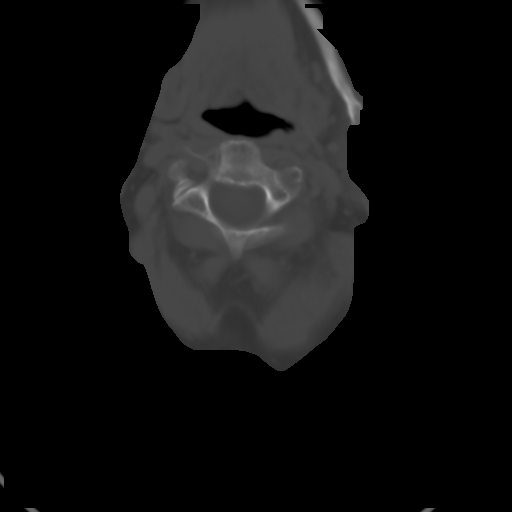
[im 5/35  brain]
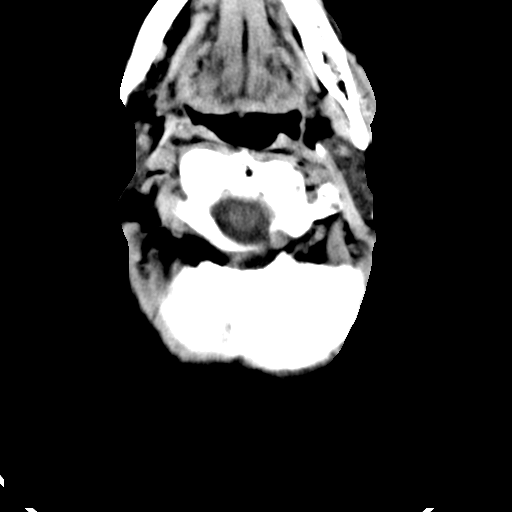
[im 10/35  brain]
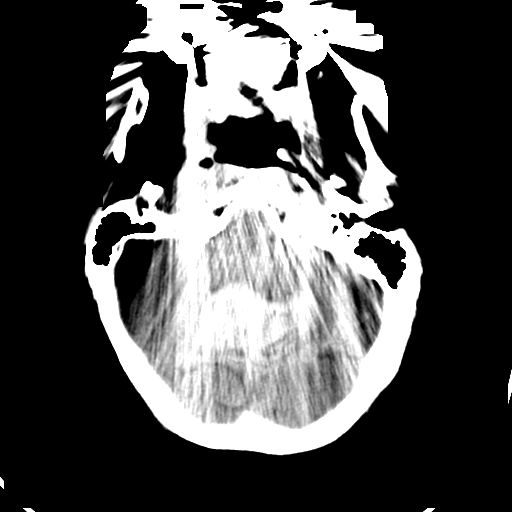
[im 13/35  brain]
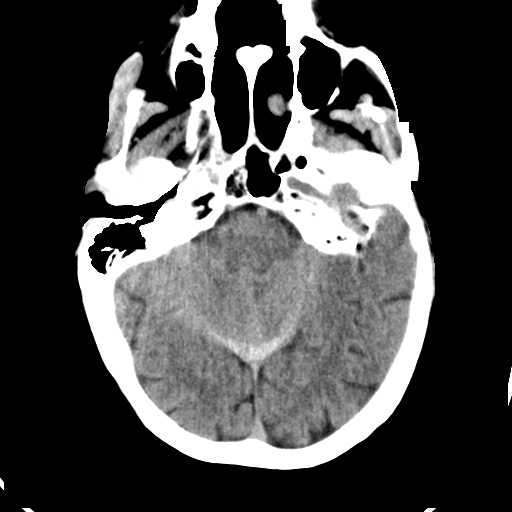
[im 15/35  brain]
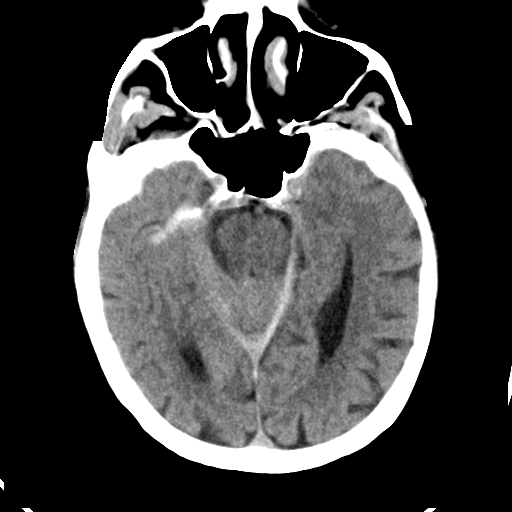
[im 15/35  bone]
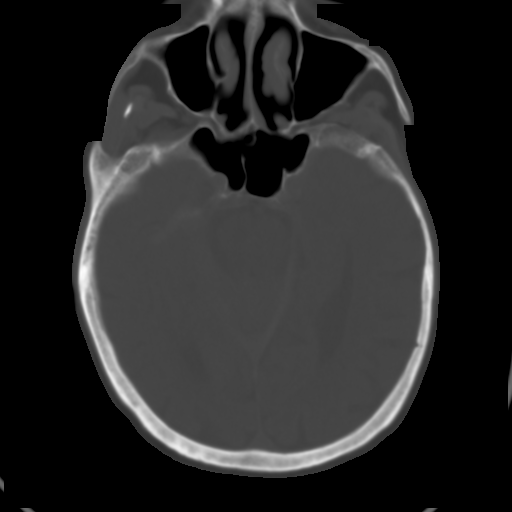
[im 20/35  brain]
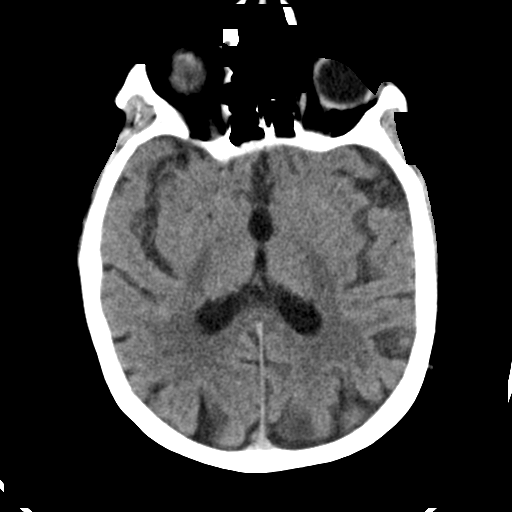
[im 22/35  brain]
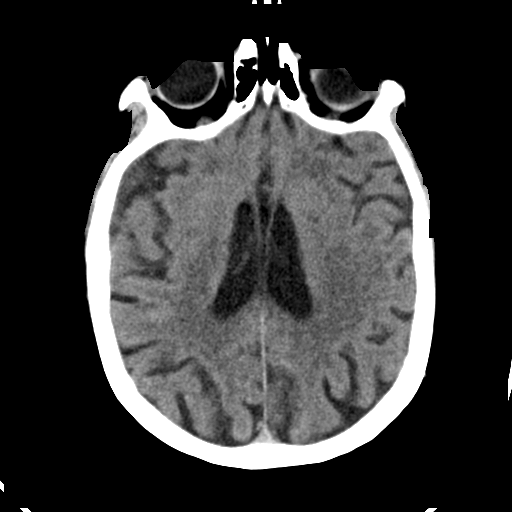
[im 25/35  brain]
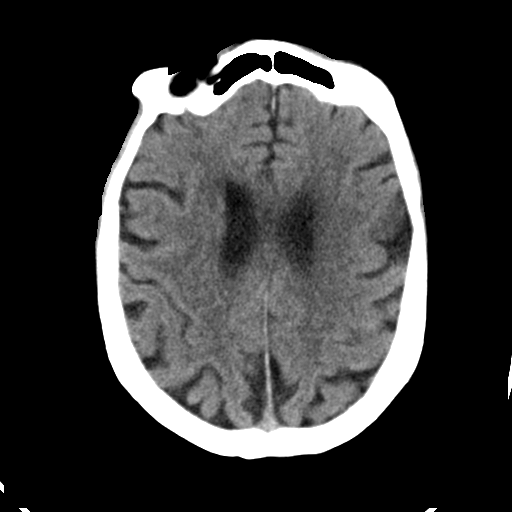
[im 30/35  brain]
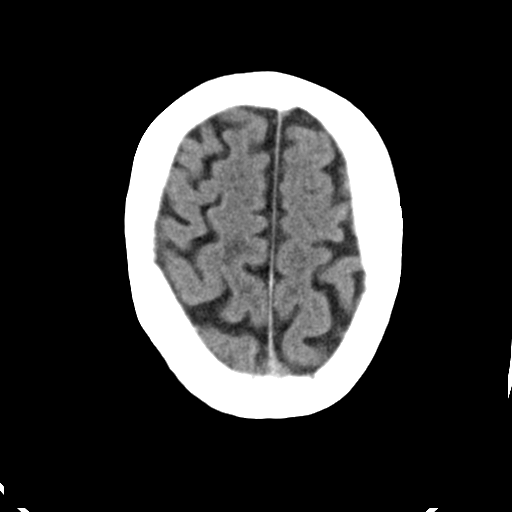
[im 30/35  bone]
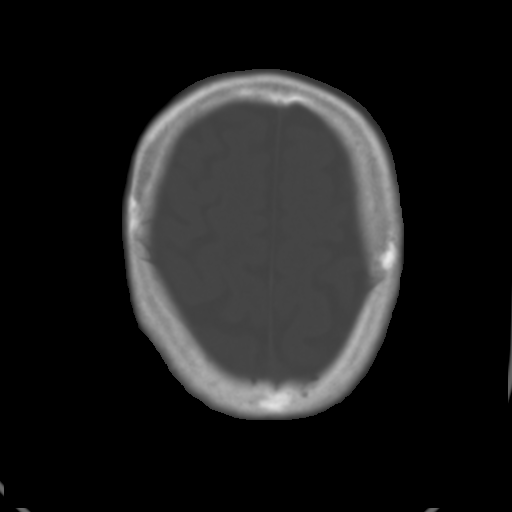
[im 32/35  brain]
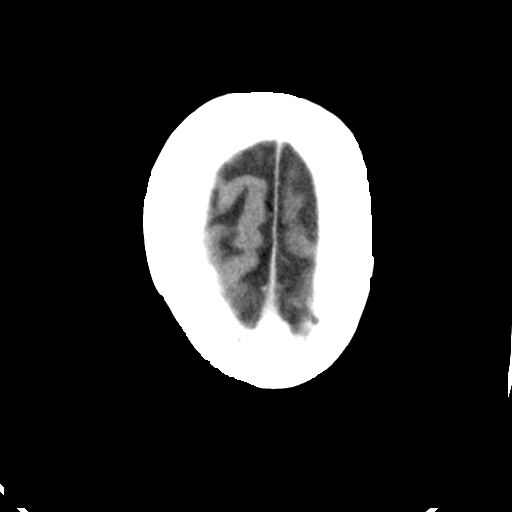

[Series 5: head 3.0 cor st · coronal · 0.34mm/px · 3 of 67 slices shown]
[im 23/67  brain]
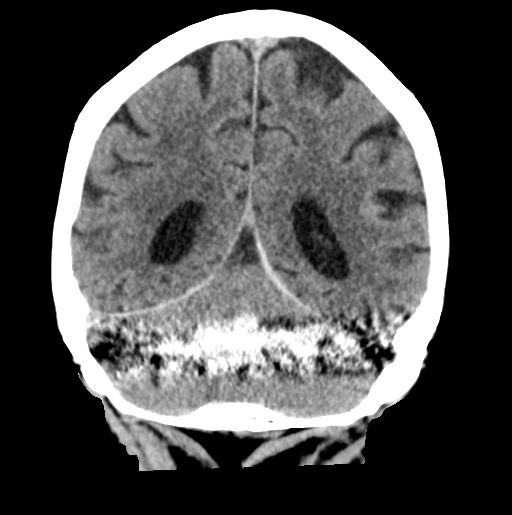
[im 30/67  brain]
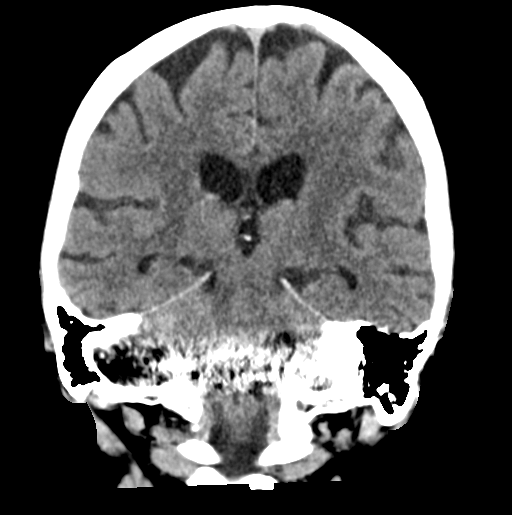
[im 37/67  brain]
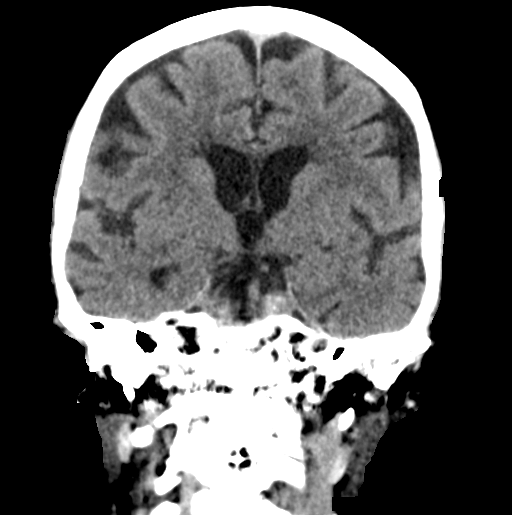

[Series 6: head 3.0 sag st · sagittal · 0.36mm/px · 3 of 55 slices shown]
[im 19/55  brain]
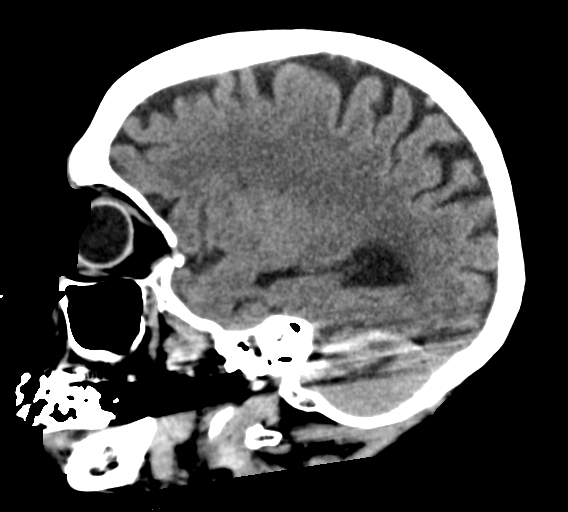
[im 28/55  brain]
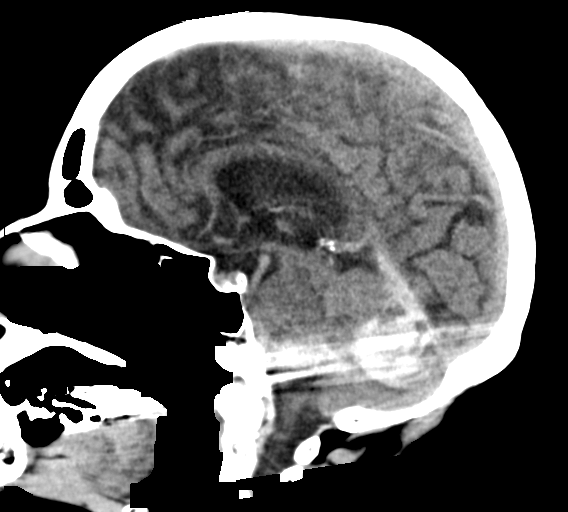
[im 37/55  brain]
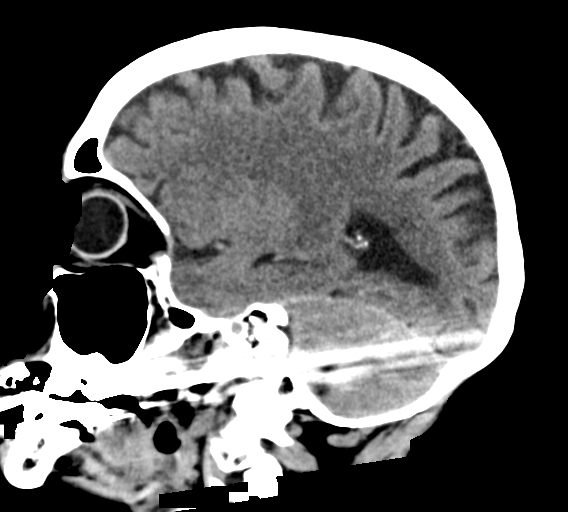

[16 of 47 positions shown; findings below may reference images not displayed]

FINDINGS: Brain: Hemorrhage in the vermis estimated at 3.2 x 3.1 x 3.3 cm
(there is extensive complicating artifact from patient's dental
amalgam) with extension into the subdural space inferior to the
tentorium and small volume subarachnoid hemorrhage seen at the level
of the interpeduncular cistern and right sylvian fissure.
Intraventricular extension is not seen. There is mass effect on the
fourth ventricle without hydrocephalus. Background brain appearance
is normal for age. No overt mass.

Vascular: No hyperdense vessel or unexpected calcification.

Skull: Normal. Negative for fracture or focal lesion.

Sinuses/Orbits: Bilateral cataract resection

Other: Critical Value/emergent results were called by telephone at
the time of interpretation on [DATE] at [DATE] to provider
LABELLE SALHA, who is already aware

ASPECTS ([place] Stroke Program Early CT Score)

Not scored in this setting
IMPRESSION: 16 cc hematoma centered in the cerebellar vermis with mild subdural
and subarachnoid extension. An underlying cause is not identified
and vascular imaging is recommended when appropriate. There is
narrowing of the fourth ventricle without hydrocephalus.

## 2019-06-10 MED ORDER — CHLORHEXIDINE GLUCONATE CLOTH 2 % EX PADS
6.0000 | MEDICATED_PAD | Freq: Every day | CUTANEOUS | Status: DC
  Administered 2019-06-11 – 2019-06-14 (×2): 6 via TOPICAL

## 2019-06-10 MED ORDER — ACETAMINOPHEN 325 MG PO TABS
650.0000 mg | ORAL_TABLET | ORAL | Status: DC | PRN
  Administered 2019-06-10 – 2019-06-12 (×9): 650 mg via ORAL
  Filled 2019-06-10 (×9): qty 2

## 2019-06-10 MED ORDER — IOHEXOL 350 MG/ML SOLN
75.0000 mL | Freq: Once | INTRAVENOUS | Status: AC | PRN
  Administered 2019-06-10: 75 mL via INTRAVENOUS

## 2019-06-10 MED ORDER — AMLODIPINE BESYLATE 2.5 MG PO TABS
2.5000 mg | ORAL_TABLET | Freq: Every day | ORAL | Status: DC
  Administered 2019-06-10 – 2019-06-12 (×3): 2.5 mg via ORAL
  Filled 2019-06-10 (×3): qty 1

## 2019-06-10 MED ORDER — PANTOPRAZOLE SODIUM 40 MG PO TBEC
40.0000 mg | DELAYED_RELEASE_TABLET | Freq: Every day | ORAL | Status: DC
  Administered 2019-06-10 – 2019-06-14 (×5): 40 mg via ORAL
  Filled 2019-06-10 (×4): qty 1

## 2019-06-10 MED ORDER — LABETALOL HCL 5 MG/ML IV SOLN
20.0000 mg | Freq: Once | INTRAVENOUS | Status: AC
  Administered 2019-06-10: 5 mg via INTRAVENOUS
  Filled 2019-06-10: qty 4

## 2019-06-10 MED ORDER — ACETAMINOPHEN 650 MG RE SUPP: 650.0000 mg | RECTAL | Status: DC | PRN

## 2019-06-10 MED ORDER — LABETALOL HCL 5 MG/ML IV SOLN
10.0000 mg | INTRAVENOUS | Status: DC | PRN
  Administered 2019-06-10: 20 mg via INTRAVENOUS
  Administered 2019-06-11 (×2): 10 mg via INTRAVENOUS
  Administered 2019-06-12: 20 mg via INTRAVENOUS
  Filled 2019-06-10 (×4): qty 4

## 2019-06-10 MED ORDER — SENNOSIDES-DOCUSATE SODIUM 8.6-50 MG PO TABS
1.0000 | ORAL_TABLET | Freq: Two times a day (BID) | ORAL | Status: DC
  Administered 2019-06-10 – 2019-06-14 (×8): 1 via ORAL
  Filled 2019-06-10 (×8): qty 1

## 2019-06-10 MED ORDER — ONDANSETRON HCL 4 MG/2ML IJ SOLN
4.0000 mg | Freq: Four times a day (QID) | INTRAMUSCULAR | Status: DC | PRN
  Administered 2019-06-10 – 2019-06-12 (×3): 4 mg via INTRAVENOUS
  Filled 2019-06-10 (×4): qty 2

## 2019-06-10 MED ORDER — ACETAMINOPHEN 160 MG/5ML PO SOLN: 650.0000 mg | ORAL | Status: DC | PRN

## 2019-06-10 MED ORDER — LABETALOL HCL 5 MG/ML IV SOLN
INTRAVENOUS | Status: DC | PRN
  Administered 2019-06-10 (×2): 5 mg via INTRAVENOUS

## 2019-06-10 MED ORDER — METOPROLOL SUCCINATE ER 50 MG PO TB24
50.0000 mg | ORAL_TABLET | Freq: Every day | ORAL | Status: DC
  Administered 2019-06-10 – 2019-06-13 (×3): 50 mg via ORAL
  Filled 2019-06-10: qty 1
  Filled 2019-06-10: qty 2
  Filled 2019-06-10: qty 1
  Filled 2019-06-10: qty 2

## 2019-06-10 MED ORDER — PANTOPRAZOLE SODIUM 40 MG IV SOLR: 40.0000 mg | Freq: Every day | INTRAVENOUS | Status: DC

## 2019-06-10 MED ORDER — POTASSIUM CHLORIDE CRYS ER 20 MEQ PO TBCR
40.0000 meq | EXTENDED_RELEASE_TABLET | ORAL | Status: AC
  Administered 2019-06-10 (×2): 40 meq via ORAL
  Filled 2019-06-10 (×2): qty 2

## 2019-06-10 MED ORDER — STROKE: EARLY STAGES OF RECOVERY BOOK
Freq: Once | Status: DC
  Filled 2019-06-10 (×2): qty 1

## 2019-06-10 MED ORDER — DIVALPROEX SODIUM 250 MG PO DR TAB
500.0000 mg | DELAYED_RELEASE_TABLET | Freq: Two times a day (BID) | ORAL | Status: DC
  Administered 2019-06-10 – 2019-06-14 (×9): 500 mg via ORAL
  Filled 2019-06-10 (×10): qty 2

## 2019-06-10 MED ORDER — CLEVIDIPINE BUTYRATE 0.5 MG/ML IV EMUL
0.0000 mg/h | INTRAVENOUS | Status: DC
  Filled 2019-06-10: qty 50

## 2019-06-10 NOTE — Evaluation (Addendum)
Speech Language Pathology Evaluation Patient Details Name: Tanya Knight MRN: [id number] DOB: January 17, 1941 Today's Date: 06/10/2019 Time: 1240-1300 SLP Time Calculation (min) (ACUTE ONLY): 20 min  Problem List:  Patient Active Problem List   Diagnosis Date Noted  . ICH (intracerebral hemorrhage) ([place]) 06/10/2019  . Hyponatremia 09/08/2014  . Advance directive discussed with patient 09/08/2014  . Routine general medical examination at a health care facility 09/03/2011  . Essential hypertension, benign 10/12/2006  . GERD 10/12/2006  . Osteoporosis 10/12/2006  . Seizure disorder ([place]) 10/12/2006   Past Medical History:  Past Medical History:  Diagnosis Date  . Fracture of metatarsal    Repair of fractured R metatarsal --Dr Duda---4/08  . GERD (gastroesophageal reflux disease)   . Hypertension   . Melanoma in situ [facility]) 7/17   Dr Kellie Moor  . Osteoporosis   . Seizure disorder ([place])   . Seizures ([place])   . Vitamin D deficiency    Past Surgical History:  Past Surgical History:  Procedure Laterality Date  . CATARACT EXTRACTION, BILATERAL Bilateral 2014  . EYE SURGERY     Obstructed tear duct  . FOOT SURGERY  2008  . MELANOMA EXCISION Left 11/2015   left lower leg  . TEAR DUCT PROBING  10/12   temporary stent  . TONSILLECTOMY AND ADENOIDECTOMY  1948   HPI:  Tanya Knight is a 79 y.o. female with a hx of GERD, hypertension, seizures presents to the Emergency Department complaining of acute, persistent generalized headache with associated slurred speech and vomiting onset around 11:30PM. Patient reports that she laid in bed but was unable to go back to sleep.  Intermittent vomiting throughout the night. She reports around 5 AM she went to get out of bed, was very imbalanced and fell.  Patient did strike her head at that time, no known LOC. Patient last known well at 10 PM when she went to bed.  Patient takes aspirin, but no anticoagulants. She lives with her husban   Assessment  / Plan / Recommendation Clinical Impression  Patient complaining of a headache and feeling drowsy upon arrival. SLP administering the COGNISTAT to assess cognitive-linguistic functioning, presenting with a severe short-term memory impairment. Per assessment results and pt's report, pt is [facility] for all other subtests of assessment, although pt had decreased awareness of cognitive deficits compared to speech deficits. Of note, pt also noted to have continued articulation deficits, about 75% intelligible within a quiet environment. Pt stated she currently lives at home with her husband, but she is the one who primarily handles the household duties - cooking, cleaning, medications, etc. Pt's drowsy state/ headache may have impacted pt's performance for assessment. SLP will continue to follow for cognitive and articulation deficits.    SLP Assessment  SLP Recommendation/Assessment: Patient needs continued Speech Lanaguage Pathology Services SLP Visit Diagnosis: Cognitive communication deficit (R41.841)    Follow Up Recommendations  Skilled Nursing facility    Frequency and Duration min 2x/week  2 weeks      SLP Evaluation Cognition  Overall Cognitive Status: Impaired/Different from baseline Arousal/Alertness: Lethargic Orientation Level: Oriented X4 Attention: Sustained Sustained Attention: Appears intact Memory: Impaired Memory Impairment: Decreased short term memory Decreased Short Term Memory: Verbal basic Problem Solving: Appears intact Safety/Judgment: Appears intact       Comprehension  Auditory Comprehension Overall Auditory Comprehension: Appears within functional limits for tasks assessed Visual Recognition/Discrimination Discrimination: Within Function Limits    Expression Expression Primary Mode of Expression: Verbal   Oral / Motor  Oral Motor/Sensory Function Overall Oral Motor/Sensory Function: Within functional limits Motor Speech Overall Motor Speech:  Impaired Respiration: Within functional limits Phonation: Normal Resonance: Within functional limits Articulation: Impaired Level of Impairment: Conversation Intelligibility: Intelligibility reduced Word: 75-100% accurate Phrase: 75-100% accurate Sentence: 75-100% accurate Conversation: 75-100% accurate Motor Planning: Witnin functional limits   GO                   Aline August, Student SLP Office: [phone]  06/10/2019, 1:44 PM

## 2019-06-10 NOTE — Progress Notes (Signed)
Bedside report and NIHSS completed with on-coming shift RN

## 2019-06-10 NOTE — H&P (Signed)
Chief Complaint: Headache nausea, weakness and slurred speech  History obtained from: Patient and Chart     HPI:                                                                                                                                       Tanya Knight is a 79 y.o. female with past medical history significant for hypertension, seizures presents to the emergency department with complaints of headache, nausea vomiting slurred speech and weakness resulting in a fall.  Patient states that she was last known normal around 11:30 PM when she suddenly felt a sharp headache.  Shortly after she felt nauseated and started vomiting and went to bed but she was unable to go back to sleep.  Around 5 AM when she went to get out of from her bed she fell which prompted her to present to the emergency department.  Code stroke was activated in the ER, stat CT head was performed which showed a 16 cm hemorrhage in the cerebellum warmness with subdural and subarachnoid extension. There is fourth ventricle effacement with no hydrocephalus at the moment.  Blood pressure was 144 /101 initially.  Blood pressure increased up to 0000000 systolic so patient received labetalol and started on Cleviprex.  Patient is on aspirin 81 mg daily.  Platelet count 299, INR 1   Date last known well: 06/09/2019 Time last known well: 11:30 PM tPA Given: No, hemorrhage NIHSS: 3 Baseline MRS 0  Intracerebral Hemorrhage (ICH) Score  Glascow Coma Score  13-15 0  Age >/= 80 yes no 0  ICH volume >/= no 0  IVH yes no 0  Infratentorial origin yes +1 Total:  1   Past Medical History:  Diagnosis Date  . Fracture of metatarsal    Repair of fractured R metatarsal --Dr Duda---4/08  . GERD (gastroesophageal reflux disease)   . Hypertension   . Melanoma in situ [facility]) 7/17   Dr Kellie Moor  . Osteoporosis   . Seizure disorder ([place])   . Seizures ([place])   . Vitamin D deficiency     Past Surgical History:  Procedure  Laterality Date  . CATARACT EXTRACTION, BILATERAL Bilateral 2014  . EYE SURGERY     Obstructed tear duct  . FOOT SURGERY  2008  . MELANOMA EXCISION Left 11/2015   left lower leg  . TEAR DUCT PROBING  10/12   temporary stent  . TONSILLECTOMY AND ADENOIDECTOMY  1948    Family History  Problem Relation Age of Onset  . Hypertension Mother   . Heart disease Father   . Breast cancer Daughter 68  . Diabetes Maternal Aunt   . Coronary artery disease Paternal Aunt   . Breast cancer Paternal Aunt   . Coronary artery disease Paternal Uncle   . Heart disease Maternal Grandmother   . Heart disease Maternal Grandfather   . Heart disease Paternal  Grandmother   . Heart disease Paternal Grandfather   . Colon cancer Neg Hx   . Stomach cancer Neg Hx   . Pancreatic cancer Neg Hx   . Rectal cancer Neg Hx    Social History:  reports that she has never smoked. She has never used smokeless tobacco. She reports current alcohol use. She reports that she does not use drugs.  Allergies:  Allergies  Allergen Reactions  . Phenytoin     Other reaction(s): Other (See Comments) Other Reaction: Other reaction REACTION: severe reaction    Medications:                                                                                                                        I reviewed home medications   ROS:                                                                                                                                     14 systems reviewed and negative except above   Examination:                                                                                                      General: Appears well-developed  Psych: Affect appropriate to situation Eyes: No scleral injection HENT: No OP obstrucion Head: Normocephalic.  Cardiovascular: Normal rate and regular rhythm.  Respiratory: Effort normal and breath sounds normal to anterior ascultation GI: Soft.  No distension. There is no  tenderness.  Skin: WDI    Neurological Examination Mental Status: Alert, oriented, thought content appropriate.  Speech fluent without evidence of aphasia.  Mild dysarthria present.  Able to follow 3 step commands without difficulty. Cranial Nerves: II: Visual fields grossly normal,  III,IV, VI: ptosis not present, extra-ocular motions intact bilaterally, pupils equal, round, reactive to light and accommodation V,VII: smile symmetric, facial light touch sensation normal bilaterally VIII: hearing normal bilaterally IX,X: uvula rises symmetrically XI: bilateral shoulder shrug XII: midline tongue extension Motor: Right :  Upper extremity   5/5    Left:     Upper extremity   5/5  Lower extremity   5/5     Lower extremity   5/5 Tone and bulk:normal tone throughout; no atrophy noted Sensory: Pinprick and light touch intact throughout, bilaterally Deep Tendon Reflexes: 2+ and symmetric throughout Plantars: Right: downgoing   Left: downgoing Cerebellar: Impaired finger-to-nose and heel-to-shin on the left side greater than the right side      Lab Results: Basic Metabolic Panel: Recent Labs  Lab 06/10/19 0519 06/10/19 0522  NA 134* 136  K 3.2* 3.3*  CL 96* 98  CO2 23  --   GLUCOSE 187* 189*  BUN 13 16  CREATININE 0.64 0.40*  CALCIUM 9.8  --     CBC: Recent Labs  Lab 06/10/19 0519 06/10/19 0522  WBC 11.9*  --   NEUTROABS 10.7*  --   HGB 14.4 16.0*  HCT 43.3 47.0*  MCV 89.8  --   PLT 299  --     Coagulation Studies: Recent Labs    06/10/19 0519  LABPROT 12.6  INR 1.0    Imaging: CT HEAD CODE STROKE WO CONTRAST  Result Date: 06/10/2019 CLINICAL DATA:  Code stroke. Right-sided weakness and slurred speech EXAM: CT HEAD WITHOUT CONTRAST TECHNIQUE: Contiguous axial images were obtained from the base of the skull through the vertex without intravenous contrast. COMPARISON:  None available FINDINGS: Brain: Hemorrhage in the vermis estimated at 3.2 x 3.1 x 3.3 cm  (there is extensive complicating artifact from patient's dental amalgam) with extension into the subdural space inferior to the tentorium and small volume subarachnoid hemorrhage seen at the level of the interpeduncular cistern and right sylvian fissure. Intraventricular extension is not seen. There is mass effect on the fourth ventricle without hydrocephalus. Background brain appearance is normal for age. No overt mass. Vascular: No hyperdense vessel or unexpected calcification. Skull: Normal. Negative for fracture or focal lesion. Sinuses/Orbits: Bilateral cataract resection Other: Critical Value/emergent results were called by telephone at the time of interpretation on 06/10/2019 at 5:25 am to provider Kamaya Keckler, who is already aware ASPECTS [facility] Stroke Program Early CT Score) Not scored in this setting IMPRESSION: 16 cc hematoma centered in the cerebellar vermis with mild subdural and subarachnoid extension. An underlying cause is not identified and vascular imaging is recommended when appropriate. There is narrowing of the fourth ventricle without hydrocephalus. Electronically Signed   By: Monte Fantasia M.D.   On: 06/10/2019 05:27     ASSESSMENT AND PLAN  79 y.o. female with past medical history significant for hypertension, seizures presents to the emergency department with complaints of headache, nausea vomiting slurred speech and weakness resulting in a fall.  Cerebellar hemorrhage -Repeat CT head and CT angiogram in 4 hours  -If follow-up CT shows evidence of hydrocephalus need to consult neurosurgery for EVD -Close neurological monitoring in the ICU with frequent neuro checks every 1-2 hours -BP goal less than 140 x 90 mmHg, started on Cleviprex -No antiplatelets/anticoagulants  Seizure -Continue Depakote 500 mg 2 times daily -Follow-up Depakote levels   This patient is neurologically critically ill due to [place].  She is at risk for significant risk of neurological worsening from cerebral  edema,  death from brain herniation, heart failure, hemorrhagic conversion, infection, respiratory failure and seizure. This patient's care requires constant monitoring of vital signs, hemodynamics, respiratory and cardiac monitoring, review of multiple databases, neurological assessment, discussion with family, other specialists and medical decision making of high  complexity.  I spent 45  minutes of neurocritical time in the care of this patient.     Jaxzen Vanhorn [facility] Pager Number [phone]

## 2019-06-10 NOTE — Progress Notes (Signed)
STROKE TEAM PROGRESS NOTE   INTERVAL HISTORY Pt RN at bedside. Pt is awake alert, interactive, orientated, still has slurry speech but no significant ataxia. Did not check gait but pt does have bilateral nystagmus on lateral gaze. Still has HA 8/10. CTA no AVM or aneurysm. Will do MRI tomorrow. She passed swallow, on diet. BP stable  Vitals:   06/10/19 0759 06/10/19 0800 06/10/19 0900 06/10/19 1000  BP: 120/68 109/76 114/74 132/62  Pulse: (!) 107 (!) 108 (!) 106 77  Resp: 18 18 13 14   Temp:  (!) 97.5 F (36.4 C)    TempSrc:  Axillary    SpO2: 97% 100% 97% 99%  Weight: 55 kg     Height: 5\' 1"  (1.549 m)       CBC:  Recent Labs  Lab 06/10/19 0519 06/10/19 0522  WBC 11.9*  --   NEUTROABS 10.7*  --   HGB 14.4 16.0*  HCT 43.3 47.0*  MCV 89.8  --   PLT 299  --     Basic Metabolic Panel:  Recent Labs  Lab 06/10/19 0519 06/10/19 0522  NA 134* 136  K 3.2* 3.3*  CL 96* 98  CO2 23  --   GLUCOSE 187* 189*  BUN 13 16  CREATININE 0.64 0.40*  CALCIUM 9.8  --    Lipid Panel:     Component Value Date/Time   CHOL 191 09/08/2014 1023   TRIG 71.0 09/08/2014 1023   HDL 87.90 09/08/2014 1023   CHOLHDL 2 09/08/2014 1023   VLDL 14.2 09/08/2014 1023   LDLCALC 89 09/08/2014 1023   HgbA1c: No results found for: HGBA1C Urine Drug Screen:     Component Value Date/Time   LABOPIA NONE DETECTED 06/10/2019 0623   COCAINSCRNUR NONE DETECTED 06/10/2019 0623   LABBENZ NONE DETECTED 06/10/2019 0623   AMPHETMU NONE DETECTED 06/10/2019 0623   THCU NONE DETECTED 06/10/2019 0623   LABBARB NONE DETECTED 06/10/2019 0623    Alcohol Level     Component Value Date/Time   ETH <10 06/10/2019 0519    IMAGING past 48 hours CT ANGIO HEAD W OR WO CONTRAST  Result Date: 06/10/2019 CLINICAL DATA:  Subarachnoid hemorrhage; cerebral hemorrhage suspected. EXAM: CT ANGIOGRAPHY HEAD TECHNIQUE: Multidetector CT imaging of the head was performed using the standard protocol during bolus administration of  intravenous contrast. Multiplanar CT image reconstructions and MIPs were obtained to evaluate the vascular anatomy. CONTRAST:  62mL OMNIPAQUE IOHEXOL 350 MG/ML SOLN COMPARISON:  Head CT 06/10/2019 FINDINGS: CTA HEAD Anterior circulation: The intracranial internal carotid arteries are patent bilaterally. Scattered calcified plaque within these vessels but no significant stenosis. The bilateral middle and anterior cerebral arteries are patent without significant proximal stenosis. 2 mm inferiorly projecting aneurysm arising from the supraclinoid right ICA (series 8, image 76) (series 12, image 108). Posterior circulation: The intracranial vertebral arteries are patent without significant stenosis. The left vertebral artery is non dominant and developmentally diminutive beyond he left vertebral artery the origin of the left PICA. The basilar artery is patent without significant stenosis. Flow is seen within the proximal PICA, AICA and SCA vessels bilaterally. Predominantly fetal origin of the left posterior cerebral artery. The bilateral posterior cerebral arteries are patent without significant stenosis. No posterior fossa aneurysm or AVM is identified. No contrast blush is demonstrated to suggest active arterial extravasation. Venous sinuses: Poorly assessed due to contrast timing. Anatomic variants: As described. IMPRESSION: No posterior fossa aneurysm or AVM is identified. Consider repeat vascular imaging once the hematoma involutes  for further assessment. No intracranial large vessel occlusion or proximal high-grade arterial stenosis. 2 mm supraclinoid right ICA aneurysm. Electronically Signed   By: Kellie Simmering DO   On: 06/10/2019 09:53   CT HEAD CODE STROKE WO CONTRAST  Result Date: 06/10/2019 CLINICAL DATA:  Code stroke. Right-sided weakness and slurred speech EXAM: CT HEAD WITHOUT CONTRAST TECHNIQUE: Contiguous axial images were obtained from the base of the skull through the vertex without intravenous  contrast. COMPARISON:  None available FINDINGS: Brain: Hemorrhage in the vermis estimated at 3.2 x 3.1 x 3.3 cm (there is extensive complicating artifact from patient's dental amalgam) with extension into the subdural space inferior to the tentorium and small volume subarachnoid hemorrhage seen at the level of the interpeduncular cistern and right sylvian fissure. Intraventricular extension is not seen. There is mass effect on the fourth ventricle without hydrocephalus. Background brain appearance is normal for age. No overt mass. Vascular: No hyperdense vessel or unexpected calcification. Skull: Normal. Negative for fracture or focal lesion. Sinuses/Orbits: Bilateral cataract resection Other: Critical Value/emergent results were called by telephone at the time of interpretation on 06/10/2019 at 5:25 am to provider [place], who is already aware ASPECTS [facility] Stroke Program Early CT Score) Not scored in this setting IMPRESSION: 16 cc hematoma centered in the cerebellar vermis with mild subdural and subarachnoid extension. An underlying cause is not identified and vascular imaging is recommended when appropriate. There is narrowing of the fourth ventricle without hydrocephalus. Electronically Signed   By: Monte Fantasia M.D.   On: 06/10/2019 05:27    PHYSICAL EXAM  Temp:  [97.3 F (36.3 C)-98.2 F (36.8 C)] 98.2 F (36.8 C) (01/29 1200) Pulse Rate:  [68-125] 80 (01/29 1300) Resp:  [10-29] 10 (01/29 1300) BP: (109-157)/(62-114) 124/85 (01/29 1300) SpO2:  [95 %-100 %] 99 % (01/29 1300) Weight:  [55 kg] 55 kg (01/29 0759)  General - Thin built, well developed, in no apparent distress.  Ophthalmologic - fundi not visualized due to noncooperation.  Cardiovascular - Regular rhythm and rate.  Mental Status -  Level of arousal and orientation to time, place, and person were intact. Language including expression, naming, repetition, comprehension was assessed and found intact. Mild dysarthria Fund of  Knowledge was assessed and was intact.  Cranial Nerves II - XII - II - Visual field intact OU. III, IV, VI - Extraocular movements intact. V - Facial sensation intact bilaterally. VII - Facial movement intact bilaterally. VIII - Hearing intact bilaterally. However, sustained bidirectional nystagmus on bilateral gaze X - Palate elevates symmetrically. Mild dysathria XI - Chin turning & shoulder shrug intact bilaterally. XII - Tongue protrusion intact.  Motor Strength - The patient's strength was symmetrical in all extremities and pronator drift was absent.  Bulk was normal and fasciculations were absent.   Motor Tone - Muscle tone was assessed at the neck and appendages and was normal.  Reflexes - The patient's reflexes were symmetrical in all extremities and she had no pathological reflexes.  Sensory - Light touch, temperature/pinprick were assessed and were symmetrical.    Coordination - The patient had no significant ataxia or dysmetria on bilateral FTN and HTS although slow action.  Tremor was absent.  Gait and Station - deferred.   ASSESSMENT/PLAN [name] is a 79 y.o. female with history of HTN and seizures presenting with sudden onset HA, nausea, vomiting, slurred speech and weakness resulting in a fall.   ICH:  Cerebellar vermis ICH with SDH and SAH, ? hypertensive vs. ?  CAA  Code Stroke CT head 16cc hematoma cerebellar vermis w/ mild SDH and SAH. 4th ventricle narrowing w/o hydrocephalus  CTA head no AVM or aneurysm seen. No LVO or stenosis. 60mm supraclinoid R ICA aneurysm.   MRI pending in am  2D Echo pending  LDL pending  HgbA1c pending  UDS neg  SCDs for VTE prophylaxis  aspirin 81 mg daily prior to admission, now on No antithrombotic given hemorrhage   Therapy recommendations:  pending   Disposition:  pending   Hypertension  Home meds:  losartan 100, amlodipine 2.5, metoprolol 50  Stable  resumed amlodipine and metoprolol . SBP goal <  140 . Labetalol PRN . Long-term BP goal normotensive  Seizures  One time seizure in 1997  On depakote 500 bid - continue  Other Stroke Risk Factors  Advanced age  ETOH use, alcohol level <10, advised to drink no more than 1 drink(s) a day  Other Active Problems  Hypokalemia 3.2-3.3- supplement  Hx melanoma insitu LLE  Hospital day # 0  This patient is critically ill due to cerebellar ICH, SAH and SDH and at significant risk of neurological worsening, death form hematoma expansion, brain herniation, cerebellar edema, hypertensive emergency, status epilepticus. This patient's care requires constant monitoring of vital signs, hemodynamics, respiratory and cardiac monitoring, review of multiple databases, neurological assessment, discussion with family, other specialists and medical decision making of high complexity. I spent 30 minutes of neurocritical care time in the care of this patient.  Rosalin Hawking, MD PhD Stroke Neurology 06/10/2019 2:05 PM  To contact Stroke Continuity provider, please refer to [email or web address] After hours, contact General Neurology

## 2019-06-10 NOTE — Progress Notes (Signed)
  Echocardiogram 2D Echocardiogram has been performed.  Tanya Knight 06/10/2019, 11:14 AM

## 2019-06-10 NOTE — Progress Notes (Signed)
PT Cancellation Note  Patient Details Name: Tanya Knight MRN: [id number] DOB: [birth year]   Cancelled Treatment:    Reason Eval/Treat Not Completed: Medical issues which prohibited therapy - Pt with active bed rest orders, per Dr. Erlinda Hong hold 1 more day for PT eval.   Marisa Cyphers, PT Acute Rehabilitation Services Pager [phone]  Office [phone]    Cherylynn Liszewski D [name] 06/10/2019, 11:28 AM

## 2019-06-10 NOTE — ED Notes (Signed)
[facility] called to activate code stroke per [employer] and Dr. Christy Gentles

## 2019-06-10 NOTE — Progress Notes (Signed)
Tech attempted to get patient down to department for exam while there was time to get pt exam done but RN insists to wait until expected date which is scheduled for tomorrow (06/11/19) because she did not want to have to come down with patient. Tech scheduled for exam for 06/11/19 in the morning.

## 2019-06-10 NOTE — ED Provider Notes (Signed)
[place] EMERGENCY DEPARTMENT Provider Note   CSN: [id number] Arrival date & time: 06/10/19  0510  An emergency department physician performed an initial assessment on this suspected stroke patient at 0514.  History Chief Complaint  Patient presents with  . Code Stroke    Tanya Knight is a 79 y.o. female with a hx of GERD, hypertension, seizures presents to the Emergency Department complaining of acute, persistent generalized headache with associated slurred speech and vomiting onset around 11:30PM. Patient reports that she laid in bed but was unable to go back to sleep.  Intermittent vomiting throughout the night. She reports around 5 AM she went to get out of bed, was very imbalanced and fell.  Patient did strike her head at that time, no known LOC. Patient last known well at 10 PM when she went to bed.  Patient takes aspirin, but no anticoagulants. She lives with her husband.   Level 5 CAVEAT for acuity of condition.   The history is provided by the patient, the EMS personnel and medical records. No language interpreter was used.       Past Medical History:  Diagnosis Date  . Fracture of metatarsal    Repair of fractured R metatarsal --Dr Duda---4/08  . GERD (gastroesophageal reflux disease)   . Hypertension   . Melanoma in situ [facility]) 7/17   Dr Kellie Moor  . Osteoporosis   . Seizure disorder ([place])   . Seizures ([place])   . Vitamin D deficiency     Patient Active Problem List   Diagnosis Date Noted  . ICH (intracerebral hemorrhage) ([place]) 06/10/2019  . Hyponatremia 09/08/2014  . Advance directive discussed with patient 09/08/2014  . Routine general medical examination at a health care facility 09/03/2011  . Essential hypertension, benign 10/12/2006  . GERD 10/12/2006  . Osteoporosis 10/12/2006  . Seizure disorder ([place]) 10/12/2006    Past Surgical History:  Procedure Laterality Date  . CATARACT EXTRACTION, BILATERAL Bilateral 2014  . EYE  SURGERY     Obstructed tear duct  . FOOT SURGERY  2008  . MELANOMA EXCISION Left 11/2015   left lower leg  . TEAR DUCT PROBING  10/12   temporary stent  . TONSILLECTOMY AND ADENOIDECTOMY  1948     OB History    Gravida  3   Para  3   Term  3   Preterm      AB      Living  3     SAB      TAB      Ectopic      Multiple      Live Births              Family History  Problem Relation Age of Onset  . Hypertension Mother   . Heart disease Father   . Breast cancer Daughter 46  . Diabetes Maternal Aunt   . Coronary artery disease Paternal Aunt   . Breast cancer Paternal Aunt   . Coronary artery disease Paternal Uncle   . Heart disease Maternal Grandmother   . Heart disease Maternal Grandfather   . Heart disease Paternal Grandmother   . Heart disease Paternal Grandfather   . Colon cancer Neg Hx   . Stomach cancer Neg Hx   . Pancreatic cancer Neg Hx   . Rectal cancer Neg Hx     Social History   Tobacco Use  . Smoking status: Never Smoker  . Smokeless tobacco: Never Used  Substance  Use Topics  . Alcohol use: Yes    Alcohol/week: 0.0 standard drinks    Comment: occasional  . Drug use: No    Home Medications Prior to Admission medications   Medication Sig Start Date End Date Taking? Authorizing Provider  amLODipine (NORVASC) 2.5 MG tablet TAKE 1 TABLET(2.5 MG) BY MOUTH DAILY 03/14/19   Viviana Simpler I, MD  aspirin 81 MG tablet Take 81 mg by mouth daily.      [provider]  cholecalciferol (VITAMIN D) 1000 units tablet Take 1,000 Units by mouth daily.    [provider]  divalproex (DEPAKOTE) 500 MG DR tablet TAKE 1 TABLET(500 MG) BY MOUTH TWICE DAILY 03/14/19   Viviana Simpler I, MD  losartan (COZAAR) 100 MG tablet TAKE 1 TABLET(100 MG) BY MOUTH DAILY 03/14/19   Viviana Simpler I, MD  metoprolol succinate (TOPROL-XL) 50 MG 24 hr tablet TAKE 1 TABLET BY MOUTH DAILY WITH OR IMMEDIATELY FOLLOWING A MEAL 03/14/19   Venia Carbon, MD    Multiple Vitamin (MULTIVITAMIN) capsule Take 1 capsule by mouth daily.      [provider]  Multiple Vitamins-Minerals (OCUVITE PO) Take by mouth.      [provider]    Allergies    Phenytoin  Review of Systems   Review of Systems  Unable to perform ROS: Acuity of condition  Gastrointestinal: Positive for nausea and vomiting.  Neurological: Positive for speech difficulty, weakness and headaches.    Physical Exam Updated Vital Signs BP (!) 154/93 (BP Location: Right Arm)   Pulse 100   Temp (!) 97.3 F (36.3 C) (Oral)   Resp 17   SpO2 98%   Physical Exam Vitals and nursing note reviewed.  Constitutional:      General: She is not in acute distress.    Appearance: She is ill-appearing. She is not diaphoretic.  HENT:     Head: Normocephalic.  Eyes:     General: No scleral icterus.    Conjunctiva/sclera: Conjunctivae normal.  Neck:     Comments: Airway intact.  No stridor.  Handling secretions. Cardiovascular:     Rate and Rhythm: Regular rhythm. Tachycardia present.     Pulses: Normal pulses.          Radial pulses are 2+ on the right side and 2+ on the left side.       Dorsalis pedis pulses are 2+ on the right side and 2+ on the left side.  Pulmonary:     Effort: No tachypnea, accessory muscle usage, prolonged expiration, respiratory distress or retractions.     Breath sounds: No stridor.     Comments: Equal chest rise. No increased work of breathing. Abdominal:     General: There is no distension.     Palpations: Abdomen is soft.     Tenderness: There is no abdominal tenderness. There is no guarding or rebound.  Musculoskeletal:     Cervical back: Normal range of motion.  Skin:    General: Skin is warm and dry.     Capillary Refill: Capillary refill takes less than 2 seconds.  Neurological:     Mental Status: She is alert.     GCS: GCS eye subscore is 4. GCS verbal subscore is 5. GCS motor subscore is 6.     Sensory: Sensation is intact.      Motor: No pronator drift.     Coordination: Finger-Nose-Finger Test abnormal and Heel to [place]ez Test abnormal.     Comments: Patient is  alert and oriented.   Slurred speech, no facial droop. No pronator drift or leg drift. Ataxia with finger-nose worst on the left.  Ataxia with heel shin worse on the left. 5 out of 5 strength in the bilateral upper and lower extremities. Sensation grossly intact in the bilateral upper and lower extremities.  Psychiatric:        Mood and Affect: Mood normal.     ED Results / Procedures / Treatments   Labs (all labs ordered are listed, but only abnormal results are displayed) Labs Reviewed  APTT - Abnormal; Notable for the following components:      Result Value   aPTT 39 (*)    All other components within normal limits  CBC - Abnormal; Notable for the following components:   WBC 11.9 (*)    All other components within normal limits  DIFFERENTIAL - Abnormal; Notable for the following components:   Neutro Abs 10.7 (*)    All other components within normal limits  COMPREHENSIVE METABOLIC PANEL - Abnormal; Notable for the following components:   Sodium 134 (*)    Potassium 3.2 (*)    Chloride 96 (*)    Glucose, Bld 187 (*)    All other components within normal limits  I-STAT CHEM 8, ED - Abnormal; Notable for the following components:   Potassium 3.3 (*)    Creatinine, Ser 0.40 (*)    Glucose, Bld 189 (*)    Hemoglobin 16.0 (*)    HCT 47.0 (*)    All other components within normal limits  CBG MONITORING, ED - Abnormal; Notable for the following components:   Glucose-Capillary 167 (*)    All other components within normal limits  RESPIRATORY PANEL BY RT PCR (FLU A&B, COVID)  ETHANOL  PROTIME-INR  RAPID URINE DRUG SCREEN, HOSP PERFORMED  URINALYSIS, ROUTINE W REFLEX MICROSCOPIC    EKG EKG Interpretation  Date/Time:  Friday June 10 2019 05:32:30 EST Ventricular Rate:  125 PR Interval:    QRS Duration: 89 QT Interval:  332 QTC  Calculation: 479 R Axis:   55 Text Interpretation: Sinus or ectopic atrial tachycardia Prolonged PR interval RSR' in V1 or V2, probably normal variant Confirmed by Ripley Fraise [phone]) on 06/10/2019 5:38:18 AM   Radiology CT HEAD CODE STROKE WO CONTRAST  Result Date: 06/10/2019 CLINICAL DATA:  Code stroke. Right-sided weakness and slurred speech EXAM: CT HEAD WITHOUT CONTRAST TECHNIQUE: Contiguous axial images were obtained from the base of the skull through the vertex without intravenous contrast. COMPARISON:  None available FINDINGS: Brain: Hemorrhage in the vermis estimated at 3.2 x 3.1 x 3.3 cm (there is extensive complicating artifact from patient's dental amalgam) with extension into the subdural space inferior to the tentorium and small volume subarachnoid hemorrhage seen at the level of the interpeduncular cistern and right sylvian fissure. Intraventricular extension is not seen. There is mass effect on the fourth ventricle without hydrocephalus. Background brain appearance is normal for age. No overt mass. Vascular: No hyperdense vessel or unexpected calcification. Skull: Normal. Negative for fracture or focal lesion. Sinuses/Orbits: Bilateral cataract resection Other: Critical Value/emergent results were called by telephone at the time of interpretation on 06/10/2019 at 5:25 am to provider [place], who is already aware ASPECTS [facility] Stroke Program Early CT Score) Not scored in this setting IMPRESSION: 16 cc hematoma centered in the cerebellar vermis with mild subdural and subarachnoid extension. An underlying cause is not identified and vascular imaging is recommended when appropriate. There is narrowing of the fourth  ventricle without hydrocephalus. Electronically Signed   By: Monte Fantasia M.D.   On: 06/10/2019 05:27    Procedures .Critical Care Performed by: Abigail Butts, PA-C Authorized by: Abigail Butts, PA-C   Critical care provider statement:    Critical care  time (minutes):  45   Critical care time was exclusive of:  Separately billable procedures and treating other patients and teaching time   Critical care was necessary to treat or prevent imminent or life-threatening deterioration of the following conditions:  CNS failure or compromise   Critical care was time spent personally by me on the following activities:  Discussions with consultants, evaluation of patient's response to treatment, examination of patient, ordering and performing treatments and interventions, ordering and review of laboratory studies, ordering and review of radiographic studies, pulse oximetry, re-evaluation of patient's condition, obtaining history from patient or surrogate and review of old charts   I assumed direction of critical care for this patient from another provider in my specialty: no     (including critical care time)  Medications Ordered in ED Medications   stroke: mapping our early stages of recovery book (has no administration in time range)  acetaminophen (TYLENOL) tablet 650 mg (650 mg Oral Given 06/10/19 0556)    Or  acetaminophen (TYLENOL) 160 MG/5ML solution 650 mg ( Per Tube See Alternative 06/10/19 0556)    Or  acetaminophen (TYLENOL) suppository 650 mg ( Rectal See Alternative 06/10/19 0556)  senna-docusate (Senokot-S) tablet 1 tablet (has no administration in time range)  pantoprazole (PROTONIX) injection 40 mg (has no administration in time range)  clevidipine (CLEVIPREX) infusion 0.5 mg/mL (has no administration in time range)  labetalol (NORMODYNE) injection (5 mg Intravenous Given 06/10/19 0549)  divalproex (DEPAKOTE) DR tablet 500 mg (has no administration in time range)  amLODipine (NORVASC) tablet 2.5 mg (has no administration in time range)  metoprolol succinate (TOPROL-XL) 24 hr tablet 50 mg (has no administration in time range)  labetalol (NORMODYNE) injection 20 mg (5 mg Intravenous Given 06/10/19 0539)    ED Course  I have reviewed the  triage vital signs and the nursing notes.  Pertinent labs & imaging results that were available during my care of the patient were reviewed by me and considered in my medical decision making (see chart for details).  Clinical Course as of Jun 09 608  Fri Jun 10, 2019  0543 Milk hypokalemia  Potassium(!): 3.3 [HM]    Clinical Course User Index [HM] Kamarian Sahakian, Gwenlyn Perking   MDM Rules/Calculators/A&P                      Patient presents critically ill as code stroke.  Last known well 10 PM.  Code stroke initiated on arrival.  I met patient at bridge and cleared her airway.  Slurred speech and ataxia of the left side.  Patient straight to CT.  Pt evaluated by Dr. Londell Moh in the ED after code stroke initiation.   CT with 16 cc hematoma centered in the cerebellar vermis with mild subdural and subarachnoid extension.  Blood pressures in the 416 systolic.  Will initiate blood pressure control. Pt to be admitted by Neurology.  The patient was discussed with and seen by Dr. Christy Gentles who agrees with the treatment plan.  Final Clinical Impression(s) / ED Diagnoses Final diagnoses:  Intracranial hemorrhage [facility])    Rx / DC Orders ED Discharge Orders    None       Loni Muse Gwenlyn Perking 06/10/19 [id number]  Ripley Fraise, MD 06/10/19 [phone]

## 2019-06-11 ENCOUNTER — Inpatient Hospital Stay (HOSPITAL_COMMUNITY): Payer: Medicare PPO

## 2019-06-11 DIAGNOSIS — I609 Nontraumatic subarachnoid hemorrhage, unspecified: Secondary | ICD-10-CM

## 2019-06-11 DIAGNOSIS — S065X9A Traumatic subdural hemorrhage with loss of consciousness of unspecified duration, initial encounter: Secondary | ICD-10-CM

## 2019-06-11 DIAGNOSIS — I615 Nontraumatic intracerebral hemorrhage, intraventricular: Secondary | ICD-10-CM

## 2019-06-11 LAB — CBC
HCT: 38.2 % (ref 36.0–46.0)
Hemoglobin: 12.7 g/dL (ref 12.0–15.0)
MCH: 29.7 pg (ref 26.0–34.0)
MCHC: 33.2 g/dL (ref 30.0–36.0)
MCV: 89.5 fL (ref 80.0–100.0)
Platelets: 267 10*3/uL (ref 150–400)
RBC: 4.27 MIL/uL (ref 3.87–5.11)
RDW: 14.3 % (ref 11.5–15.5)
WBC: 10.7 10*3/uL — ABNORMAL HIGH (ref 4.0–10.5)
nRBC: 0 % (ref 0.0–0.2)

## 2019-06-11 LAB — BASIC METABOLIC PANEL
Anion gap: 10 (ref 5–15)
BUN: 17 mg/dL (ref 8–23)
CO2: 25 mmol/L (ref 22–32)
Calcium: 9.4 mg/dL (ref 8.9–10.3)
Chloride: 98 mmol/L (ref 98–111)
Creatinine, Ser: 0.78 mg/dL (ref 0.44–1.00)
GFR calc Af Amer: >60 mL/min (ref >60)
GFR calc non Af Amer: >60 mL/min (ref >60)
Glucose, Bld: 105 mg/dL — ABNORMAL HIGH (ref 70–99)
Potassium: 4.6 mmol/L (ref 3.5–5.1)
Sodium: 133 mmol/L — ABNORMAL LOW (ref 135–145)

## 2019-06-11 LAB — LIPID PANEL
Cholesterol: 219 mg/dL — ABNORMAL HIGH (ref 0–200)
HDL: 84 mg/dL (ref >40)
LDL Cholesterol: 120 mg/dL — ABNORMAL HIGH (ref 0–99)
Total CHOL/HDL Ratio: 2.6 RATIO
Triglycerides: 73 mg/dL (ref <150)
VLDL: 15 mg/dL (ref 0–40)

## 2019-06-11 LAB — HEMOGLOBIN A1C
Hgb A1c MFr Bld: 5.1 % (ref 4.8–5.6)
Mean Plasma Glucose: 99.67 mg/dL

## 2019-06-11 IMAGING — MR MR HEAD W/O CM
8 of 11 series · 27 of 48 positions shown · non-contrast
Comparison: CT studies done yesterday.

CLINICAL DATA: Cerebellar hemorrhage.

EXAM:
MRI HEAD WITHOUT CONTRAST
TECHNIQUE: Multiplanar, multiecho pulse sequences of the brain and surrounding
structures were obtained without intravenous contrast.

[Series 2: FLAIR · sagittal · 5.0mm · 0.23mm/px · 2 of 25 slices shown (1 of 2)]
[im 1/25]
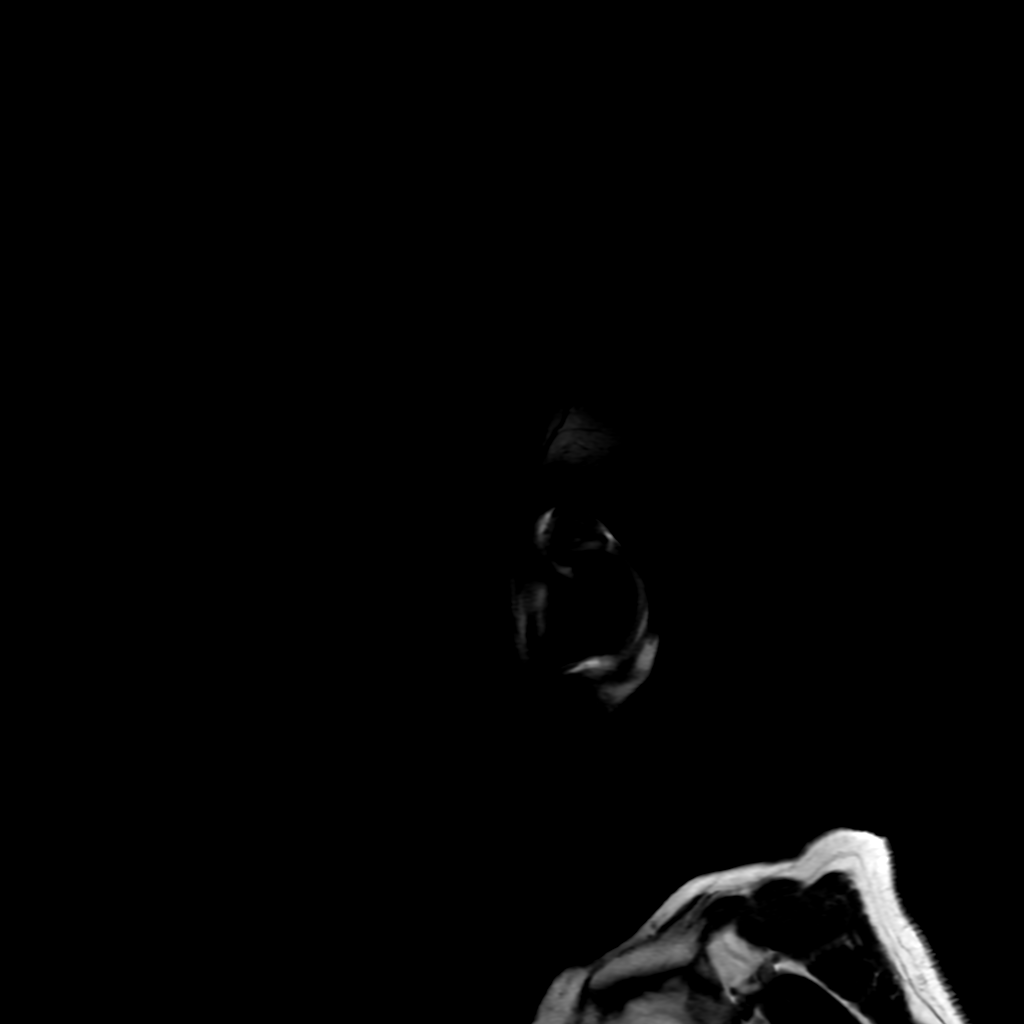
[im 25/25]
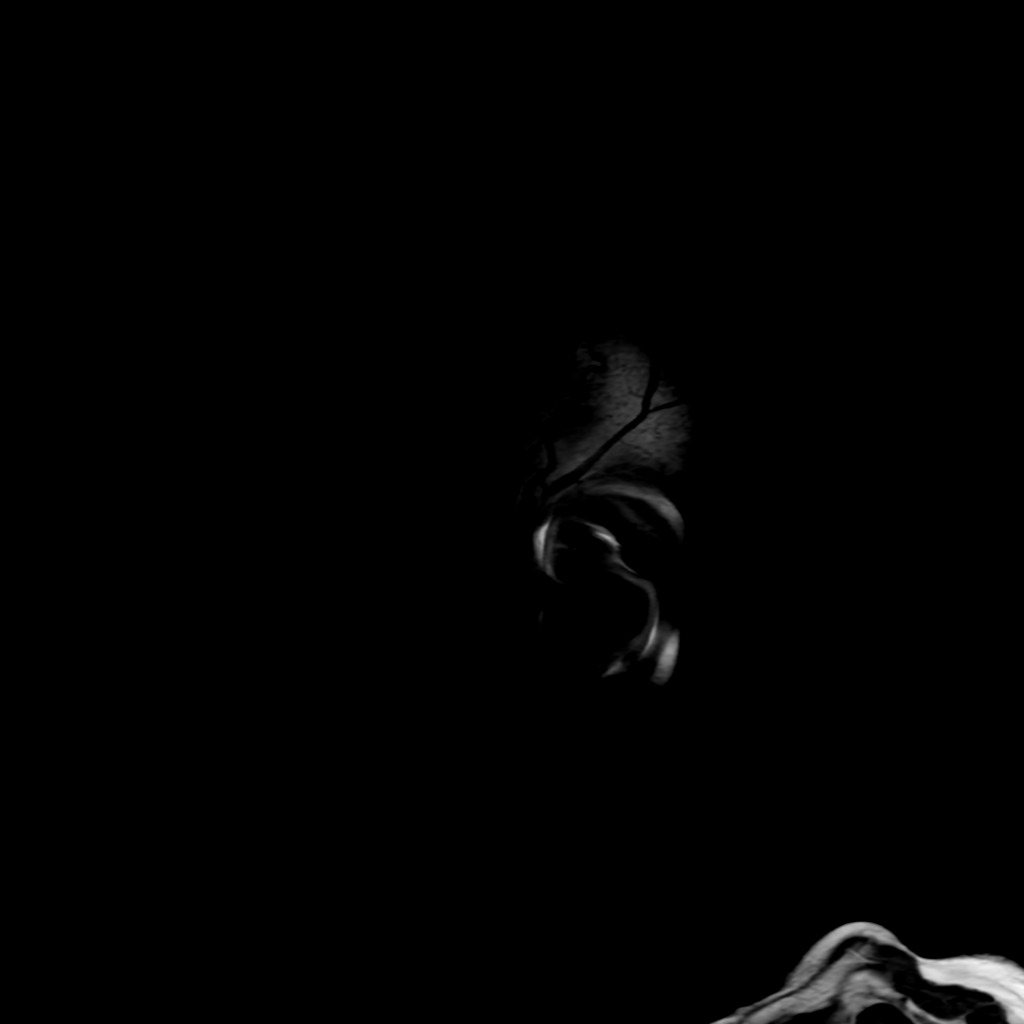

[Series 3: DWI · axial · 3.0mm · 0.94mm/px · z∈[-153,-7]mm · 8 of 104 slices shown (1 of 2)]
[im 1/104]
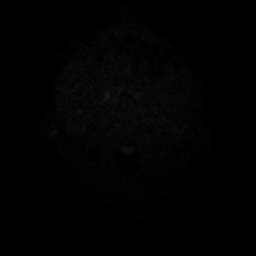
[im 15/104]
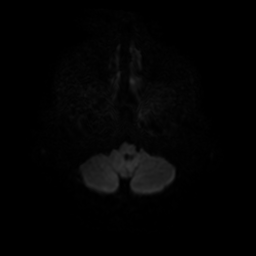
[im 30/104]
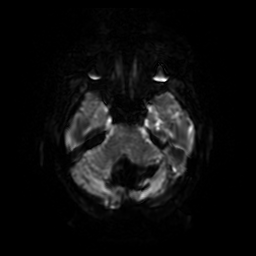
[im 45/104]
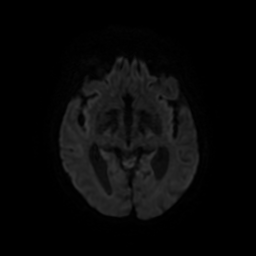
[im 59/104]
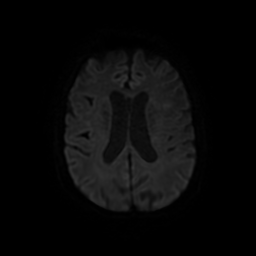
[im 74/104]
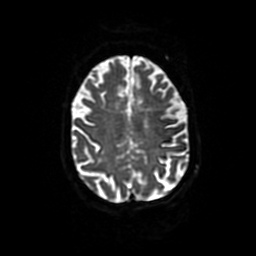
[im 89/104]
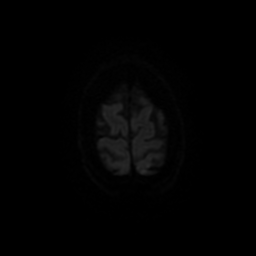
[im 104/104]
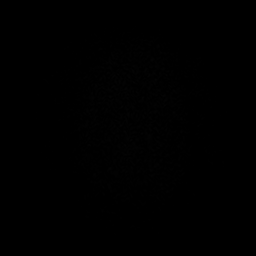

[Series 4: DWI · coronal · 4.0mm · 0.94mm/px · 5 of 70 slices shown (2 of 2)]
[im 1/70]
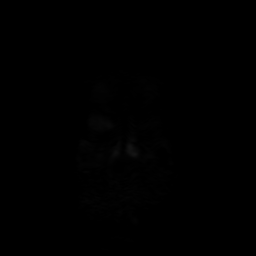
[im 18/70]
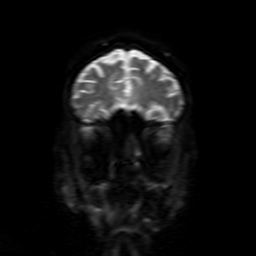
[im 35/70]
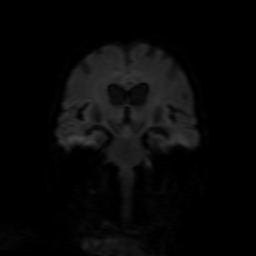
[im 52/70]
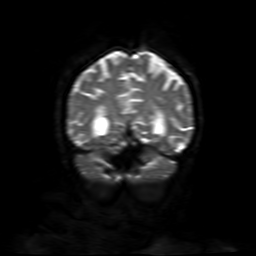
[im 70/70]
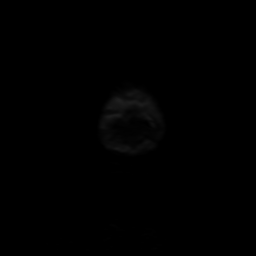

[Series 5: FLAIR · axial · 3.0mm · 0.41mm/px · z∈[-137,+2]mm · 2 of 25 slices shown (2 of 2)]
[im 1/25]
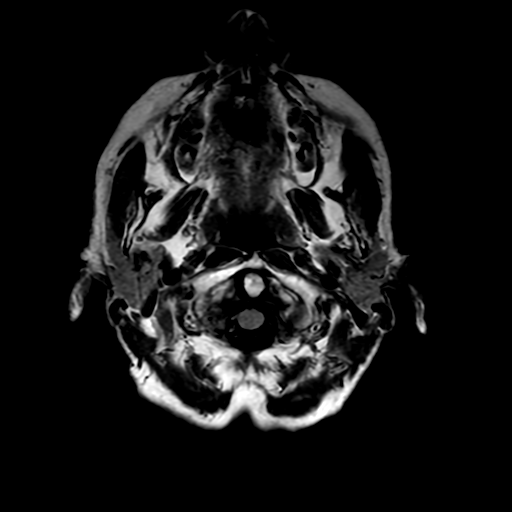
[im 25/25]
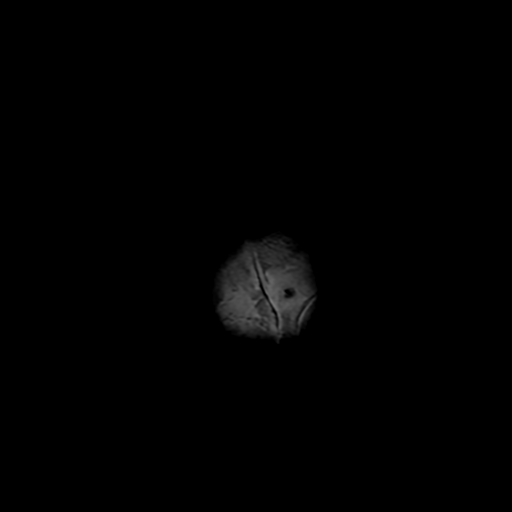

[Series 7: T2 · axial · 5.0mm · 0.41mm/px · z∈[-137,+2]mm · 2 of 25 slices shown (1 of 2)]
[im 1/25]
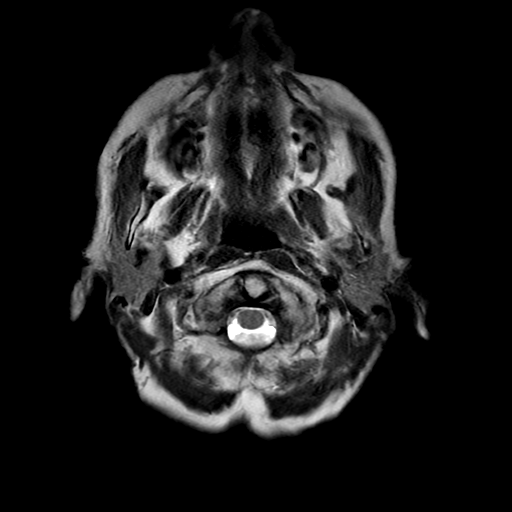
[im 25/25]
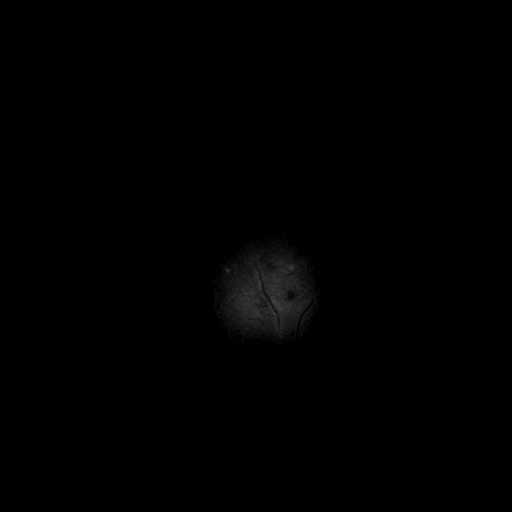

[Series 9: T2 · coronal · 5.0mm · 0.39mm/px · 1 of 29 slices shown (2 of 2)]
[im 1/29]
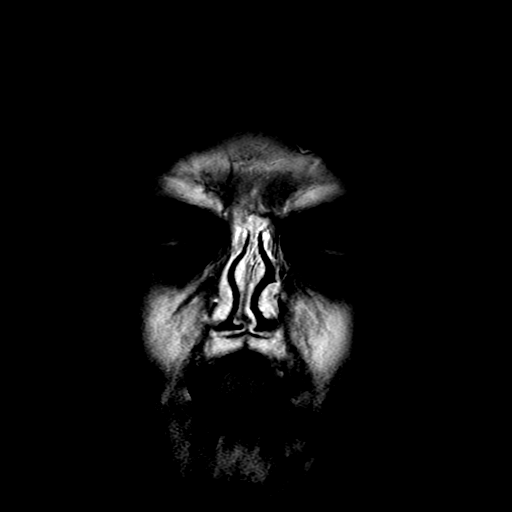

[Series 350: ADC · axial · 3.0mm · 0.94mm/px · z∈[-153,-10]mm · 4 of 51 slices shown (1 of 2)]
[im 1/51]
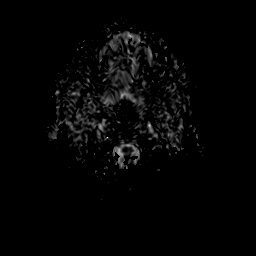
[im 17/51]
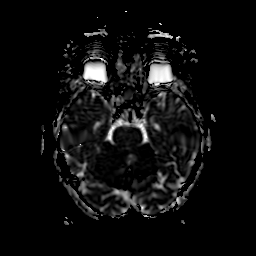
[im 34/51]
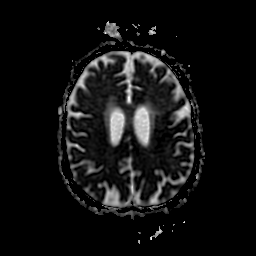
[im 51/51]
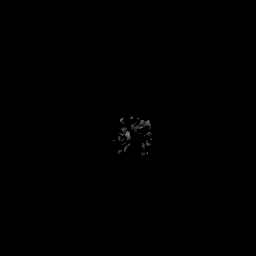

[Series 450: ADC · coronal · 4.0mm · 0.94mm/px · 3 of 35 slices shown (2 of 2)]
[im 1/35]
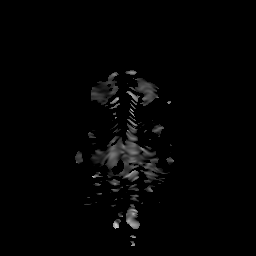
[im 18/35]
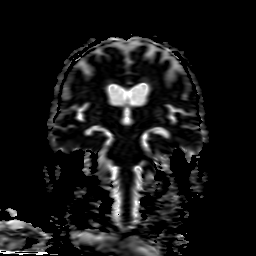
[im 35/35]
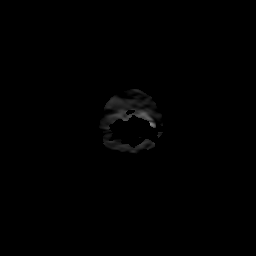

[27 of 48 positions shown; findings below may reference images not displayed]

FINDINGS: Brain: Redemonstrated is acute intraparenchymal hemorrhage in the
left cerebellum and cerebellar vermis. Overall size of the hematoma
is approximately 4 cm in diameter. Surrounding edema. Small
intraparenchymal cystic space measuring about 8 mm in size in the
left cerebellum, nature uncertain. Subarachnoid extension has
occurs. Blood products are present within the cerebellar folia. Some
intraventricular blood layering dependently in the occipital horns.
Cerebral hemispheres show a background pattern of mild for age
chronic small-vessel disease of the white matter. Lateral and third
ventricles are slightly prominent. There is some potential for
ventricular obstruction at the distal aqueduct and upper fourth
ventricle.

Vascular: Major vessels at the base of the brain show flow.

Skull and upper cervical spine: Negative

Sinuses/Orbits: Clear/normal

Other: None
IMPRESSION: MRI redemonstrates intraparenchymal hemorrhage in the left
cerebellar hemisphere and cerebellar vermis measuring about 4 cm in
diameter. There is surrounding edema. Subarachnoid penetration. 8 mm
cystic area seen along the left lateral margin of the hematoma.
Question if this could represent a pre-existing abnormality, not
specifically characterized. Small amount of subarachnoid blood has
migrated dependent within the occipital horns of the lateral
ventricles. Prominence of the lateral and third ventricles could
possibly relate to compromise of the distal aqueduct/upper fourth
ventricle.

Mild chronic small-vessel ischemic change of the cerebral
hemispheric white matter otherwise.

Continued follow-up suggested, including postcontrast imaging to
help look for an underlying brain lesion.

## 2019-06-11 MED ORDER — HEPARIN SODIUM (PORCINE) 5000 UNIT/ML IJ SOLN
5000.0000 [IU] | Freq: Two times a day (BID) | INTRAMUSCULAR | Status: DC
  Administered 2019-06-11 – 2019-06-14 (×6): 5000 [IU] via SUBCUTANEOUS
  Filled 2019-06-11 (×6): qty 1

## 2019-06-11 NOTE — Progress Notes (Signed)
STROKE TEAM PROGRESS NOTE   INTERVAL HISTORY Pt is awake alert, interactive, orientated, dysarthria improved, no significant ataxia. HA mild today. MRI stable hematoma and no hydrocephalus.   Vitals:   06/11/19 0400 06/11/19 0500 06/11/19 0600 06/11/19 0700  BP: 138/69 140/75 120/63 127/68  Pulse: 61 66 66 64  Resp: 12 13 16 12   Temp: 98.1 F (36.7 C)     TempSrc: Axillary     SpO2: 97% 97% 96% 97%  Weight:      Height:        CBC:  Recent Labs  Lab 06/10/19 0519 06/10/19 0519 06/10/19 0522 06/11/19 0241  WBC 11.9*  --   --  10.7*  NEUTROABS 10.7*  --   --   --   HGB 14.4   < > 16.0* 12.7  HCT 43.3   < > 47.0* 38.2  MCV 89.8  --   --  89.5  PLT 299  --   --  267   < > = values in this interval not displayed.    Basic Metabolic Panel:  Recent Labs  Lab 06/10/19 0519 06/10/19 0519 06/10/19 0522 06/11/19 0241  NA 134*   < > 136 133*  K 3.2*   < > 3.3* 4.6  CL 96*   < > 98 98  CO2 23  --   --  25  GLUCOSE 187*   < > 189* 105*  BUN 13   < > 16 17  CREATININE 0.64   < > 0.40* 0.78  CALCIUM 9.8  --   --  9.4   < > = values in this interval not displayed.   Lipid Panel:     Component Value Date/Time   CHOL 219 (H) 06/11/2019 0241   TRIG 73 06/11/2019 0241   HDL 84 06/11/2019 0241   CHOLHDL 2.6 06/11/2019 0241   VLDL 15 06/11/2019 0241   LDLCALC 120 (H) 06/11/2019 0241   HgbA1c:  Lab Results  Component Value Date   HGBA1C 5.1 06/11/2019   Urine Drug Screen:     Component Value Date/Time   LABOPIA NONE DETECTED 06/10/2019 0623   COCAINSCRNUR NONE DETECTED 06/10/2019 0623   LABBENZ NONE DETECTED 06/10/2019 0623   AMPHETMU NONE DETECTED 06/10/2019 0623   THCU NONE DETECTED 06/10/2019 0623   LABBARB NONE DETECTED 06/10/2019 0623    Alcohol Level     Component Value Date/Time   ETH <10 06/10/2019 0519    IMAGING past 48 hours  CT ANGIO HEAD W OR WO CONTRAST 06/10/2019 IMPRESSION:  No posterior fossa aneurysm or AVM is identified. Consider repeat  vascular imaging once the hematoma involutes for further assessment. No intracranial large vessel occlusion or proximal high-grade arterial stenosis. 2 mm supraclinoid right ICA aneurysm.    ECHOCARDIOGRAM COMPLETE 06/10/2019 IMPRESSIONS   1. Left ventricular ejection fraction, by visual estimation, is 55 to 60%. The left ventricle has normal function. There is no left ventricular hypertrophy.   2. Left ventricular diastolic parameters are consistent with Grade I diastolic dysfunction (impaired relaxation).   3. The left ventricle has no regional wall motion abnormalities.   4. Global right ventricle has normal systolic function.The right ventricular size is normal. No increase in right ventricular wall thickness.   5. Left atrial size was normal.   6. Right atrial size was normal.   7. The mitral valve is normal in structure. Trivial mitral valve regurgitation. No evidence of mitral stenosis.   8. The tricuspid valve is normal  in structure.   9. The tricuspid valve is normal in structure. Tricuspid valve regurgitation is not demonstrated.  10. The aortic valve is normal in structure. Aortic valve regurgitation is trivial. Mild aortic valve sclerosis without stenosis.  11. The pulmonic valve was normal in structure. Pulmonic valve regurgitation is not visualized.  12. The inferior vena cava is normal in size with greater than 50% respiratory variability, suggesting right atrial pressure of 3 mmHg.    CT HEAD CODE STROKE WO CONTRAST 06/10/2019 IMPRESSION:  16 cc hematoma centered in the cerebellar vermis with mild subdural and subarachnoid extension. An underlying cause is not identified and vascular imaging is recommended when appropriate. There is narrowing of the fourth ventricle without hydrocephalus.    PHYSICAL EXAM  Temp:  [97.7 F (36.5 C)-98.2 F (36.8 C)] 98.1 F (36.7 C) (01/30 0400) Pulse Rate:  [59-106] 64 (01/30 0700) Resp:  [10-20] 12 (01/30 0700) BP: (89-160)/(51-86)  127/68 (01/30 0700) SpO2:  [95 %-100 %] 97 % (01/30 0700)  General - Thin built, well developed, in no apparent distress.  Ophthalmologic - fundi not visualized due to noncooperation.  Cardiovascular - Regular rhythm and rate.  Mental Status -  Level of arousal and orientation to time, place, and person were intact. Language including expression, naming, repetition, comprehension was assessed and found intact. Mild dysarthria Fund of Knowledge was assessed and was intact.  Cranial Nerves II - XII - II - Visual field intact OU. III, IV, VI - Extraocular movements intact. V - Facial sensation intact bilaterally. VII - Facial movement intact bilaterally. VIII - Hearing intact bilaterally. Only unsustained right gaze nystagmus today X - Palate elevates symmetrically. Mild dysathria XI - Chin turning & shoulder shrug intact bilaterally. XII - Tongue protrusion intact.  Motor Strength - The patient's strength was symmetrical in all extremities and pronator drift was absent.  Bulk was normal and fasciculations were absent.   Motor Tone - Muscle tone was assessed at the neck and appendages and was normal.  Reflexes - The patient's reflexes were symmetrical in all extremities and she had no pathological reflexes.  Sensory - Light touch, temperature/pinprick were assessed and were symmetrical.    Coordination - The patient had no significant ataxia or dysmetria on bilateral FTN and HTS although slow action.  Tremor was absent.  Gait and Station - deferred.   ASSESSMENT/PLAN [name] is a 79 y.o. female with history of HTN and seizures presenting with sudden onset HA, nausea, vomiting, slurred speech and weakness resulting in a fall.   ICH:  Cerebellar vermis ICH with SDH and SAH, ? hypertensive vs. ? CAA  Code Stroke CT head 16cc hematoma cerebellar vermis w/ mild SDH and SAH. 4th ventricle narrowing w/o hydrocephalus  CTA head no AVM or aneurysm seen. No LVO or stenosis.  88mm supraclinoid R ICA aneurysm.   MRI - stable hematoma, SDH, SAH, trace IVH, no hydrocephalus, no obvious CAA  2D Echo - EF 55 to 60%. No cardiac source of emboli identified.   LDL - 120  HgbA1c - 5.1  UDS neg  Heparin subq for VTE prophylaxis  aspirin 81 mg daily prior to admission, now on No antithrombotic given hemorrhage   Therapy recommendations:  pending   Disposition:  pending   Hypertension  Home meds:  losartan 100, amlodipine 2.5, metoprolol 50  Stable  resumed amlodipine and metoprolol . SBP goal < 140 . Labetalol PRN . Long-term BP goal normotensive  Seizures  One time seizure  in 1997  On depakote 500 bid - continue  Other Stroke Risk Factors  Advanced age  ETOH use, alcohol level <10, advised to drink no more than 1 drink(s) a day  Other Active Problems  Hypokalemia 3.2-3.3 - supplement->4.6  Hx melanoma insitu LLE  Code status - Full code  Mild Leukocytosis - 10.7 (afebrile)  Hospital day # 1  This patient is critically ill due to cerebellar ICH, SAH and SDH and at significant risk of neurological worsening, death form hematoma expansion, brain herniation, cerebellar edema, hypertensive emergency, status epilepticus. This patient's care requires constant monitoring of vital signs, hemodynamics, respiratory and cardiac monitoring, review of multiple databases, neurological assessment, discussion with family, other specialists and medical decision making of high complexity. I spent 30 minutes of neurocritical care time in the care of this patient. I had long discussion with son over the phone and pt at bedside, updated pt current condition, treatment plan and potential prognosis, and answered all the questions. They expressed understanding and appreciation.   Rosalin Hawking, MD PhD Stroke Neurology 06/11/2019 3:43 PM    To contact Stroke Continuity provider, please refer to [email or web address] After hours, contact General Neurology

## 2019-06-11 NOTE — Evaluation (Signed)
Physical Therapy Evaluation Patient Details Name: Tanya Knight MRN: [id number] DOB: May 07, 1941 Today's Date: 06/11/2019   History of Present Illness  [name] is a 79 y.o. female with history of HTN and seizures presenting with sudden onset HA, nausea, vomiting, slurred speech and weakness resulting in a fall. MRI with intraparenchymal hemorrahge in left cerebellar hemisphere and cerebellar vermis.   Clinical Impression  Pt admitted with above. Pt reporting headache; alleviated by ice and dim lighting. On PT evaluation, pt presents with cognitive impairments, balance deficits, decreased truncal control, and decreased coordination. Pt requiring up to moderate assist for stand pivot transfers using a walker. BP 121/80 post mobility. Further ambulation deferred due to frequent urinary incontinence. Prior to admission, pt lives with her husband and is independent. Would benefit from CIR to address deficits and maximize functional independence.     Follow Up Recommendations CIR    Equipment Recommendations  Rolling walker with 5" wheels    Recommendations for Other Services Rehab consult     Precautions / Restrictions Precautions Precautions: Fall Restrictions Weight Bearing Restrictions: No      Mobility  Bed Mobility Overal bed mobility: Needs Assistance Bed Mobility: Supine to Sit     Supine to sit: Min guard        Transfers Overall transfer level: Needs assistance Equipment used: Rolling walker (2 wheeled) Transfers: Sit to/from [facility] Sit to Stand: Min assist Stand pivot transfers: Mod assist;+2 safety/equipment       General transfer comment: MinA to rise from edge of bed, modA for stability during stand pivot due to posterior lean, decreased truncal control  Ambulation/Gait             General Gait Details: deferred due to urinary incontinence  Stairs            Wheelchair Mobility    Modified Rankin (Stroke  Patients Only) Modified Rankin (Stroke Patients Only) Pre-Morbid Rankin Score: No symptoms Modified Rankin: Moderately severe disability     Balance Overall balance assessment: Needs assistance Sitting-balance support: Feet supported Sitting balance-Leahy Scale: Fair Sitting balance - Comments: Intermittent left lateral and posterior lean; requiring close supervision   Standing balance support: Bilateral upper extremity supported Standing balance-Leahy Scale: Poor Standing balance comment: reliant on external support                             Pertinent Vitals/Pain Pain Assessment: Faces Faces Pain Scale: Hurts little more Pain Location: headache Pain Descriptors / Indicators: Headache Pain Intervention(s): Monitored during session    Home Living Family/patient expects to be discharged to:: Private residence Living Arrangements: Spouse/significant other Available Help at Discharge: Family;Available 24 hours/day Type of Home: House Home Access: Stairs to enter Entrance Stairs-Rails: Psychiatric nurse of Steps: 3 Home Layout: One level Home Equipment: Walker - 2 wheels;Cane - single point      Prior Function Level of Independence: Independent               Hand Dominance        Extremity/Trunk Assessment   Upper Extremity Assessment Upper Extremity Assessment: Defer to OT evaluation    Lower Extremity Assessment Lower Extremity Assessment: Overall WFL for tasks assessed       Communication   Communication: No difficulties  Cognition Arousal/Alertness: Awake/alert Behavior During Therapy: WFL for tasks assessed/performed Overall Cognitive Status: Impaired/Different from baseline Area of Impairment: Orientation;Awareness;Safety/judgement  Orientation Level: Disoriented to;Situation       Safety/Judgement: Decreased awareness of deficits Awareness: Emergent   General Comments: Pt A&Ox3, not  oriented to situation. Overall very pleasant but question true insight into deficits.      General Comments      Exercises     Assessment/Plan    PT Assessment Patient needs continued PT services  PT Problem List Decreased strength;Decreased activity tolerance;Decreased balance;Decreased mobility;Decreased coordination;Decreased cognition;Decreased safety awareness       PT Treatment Interventions DME instruction;Gait training;Stair training;Functional mobility training;Therapeutic activities;Therapeutic exercise;Balance training;Patient/family education    PT Goals (Current goals can be found in the Care Plan section)  Acute Rehab PT Goals Patient Stated Goal: no headache PT Goal Formulation: With patient Time For Goal Achievement: 06/25/19 Potential to Achieve Goals: Good    Frequency Min 4X/week   Barriers to discharge        Co-evaluation               AM-PAC PT "6 Clicks" Mobility  Outcome Measure Help needed turning from your back to your side while in a flat bed without using bedrails?: None Help needed moving from lying on your back to sitting on the side of a flat bed without using bedrails?: A Little Help needed moving to and from a bed to a chair (including a wheelchair)?: A Little Help needed standing up from a chair using your arms (e.g., wheelchair or bedside chair)?: A Little Help needed to walk in hospital room?: A Lot Help needed climbing 3-5 steps with a railing? : A Lot 6 Click Score: 17    End of Session Equipment Utilized During Treatment: Gait belt Activity Tolerance: Patient tolerated treatment well Patient left: in chair;with call bell/phone within reach;with chair alarm set Nurse Communication: Mobility status PT Visit Diagnosis: Unsteadiness on feet (R26.81);Ataxic gait (R26.0);Difficulty in walking, not elsewhere classified (R26.2);Other symptoms and signs involving the nervous system (R29.898)    Time: [id number] PT Time Calculation  (min) (ACUTE ONLY): 31 min   Charges:   PT Evaluation $PT Eval Moderate Complexity: 1 Mod PT Treatments $Therapeutic Activity: 8-22 mins          Wyona Almas, PT, DPT Acute Rehabilitation Services Pager [phone] Office [phone]   Deno Etienne 06/11/2019, 4:52 PM

## 2019-06-11 NOTE — Plan of Care (Signed)
Pt SBP has been meeting goal of less than 140

## 2019-06-11 NOTE — Progress Notes (Signed)
Pt arrived to 3W31. VSS, complains of headache 9/10 which pt states has not changed in intensity since admission. PRN tylenol given. Telemetry verified. Skin tear on left elbow, abrasion on upper lip, and generalized bruising on BUE.

## 2019-06-11 NOTE — Progress Notes (Signed)
RN discussed the plan of care for today with the pt who is scheduled to get an MRI done this morning. Pt states she is not claustrophobic.

## 2019-06-12 ENCOUNTER — Inpatient Hospital Stay (HOSPITAL_COMMUNITY): Payer: Medicare PPO

## 2019-06-12 LAB — CBC
HCT: 38.3 % (ref 36.0–46.0)
Hemoglobin: 12.7 g/dL (ref 12.0–15.0)
MCH: 30 pg (ref 26.0–34.0)
MCHC: 33.2 g/dL (ref 30.0–36.0)
MCV: 90.5 fL (ref 80.0–100.0)
Platelets: 244 10*3/uL (ref 150–400)
RBC: 4.23 MIL/uL (ref 3.87–5.11)
RDW: 14 % (ref 11.5–15.5)
WBC: 6.6 10*3/uL (ref 4.0–10.5)
nRBC: 0 % (ref 0.0–0.2)

## 2019-06-12 LAB — BASIC METABOLIC PANEL
Anion gap: 9 (ref 5–15)
BUN: 17 mg/dL (ref 8–23)
CO2: 24 mmol/L (ref 22–32)
Calcium: 9.3 mg/dL (ref 8.9–10.3)
Chloride: 101 mmol/L (ref 98–111)
Creatinine, Ser: 0.65 mg/dL (ref 0.44–1.00)
GFR calc Af Amer: >60 mL/min (ref >60)
GFR calc non Af Amer: >60 mL/min (ref >60)
Glucose, Bld: 109 mg/dL — ABNORMAL HIGH (ref 70–99)
Potassium: 5 mmol/L (ref 3.5–5.1)
Sodium: 134 mmol/L — ABNORMAL LOW (ref 135–145)

## 2019-06-12 IMAGING — CT CT HEAD W/O CM
4 series · 16 of 47 positions shown, 18 images · non-contrast
Comparison: MRI [DATE].  CT studies [DATE].

CLINICAL DATA: Right-sided weakness and slurred speech. Follow-up
cerebellar hemorrhage.

EXAM:
CT HEAD WITHOUT CONTRAST
TECHNIQUE: Contiguous axial images were obtained from the base of the skull
through the vertex without intravenous contrast.

[Series 3: head without · axial · non-contrast · 0.41mm/px · z∈[-111,-6]mm · 7 of 29 slices shown, 9 images]
[im 4/29  brain]
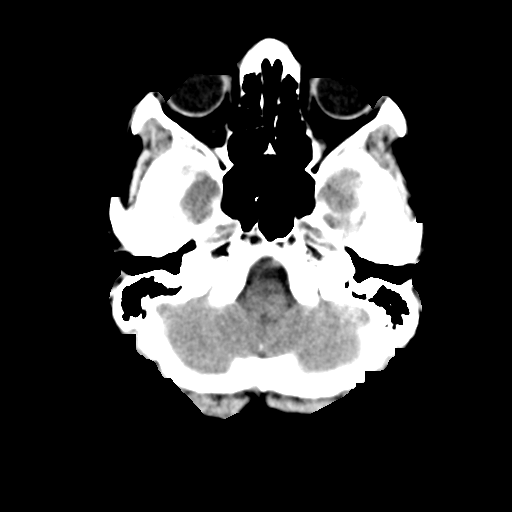
[im 4/29  bone]
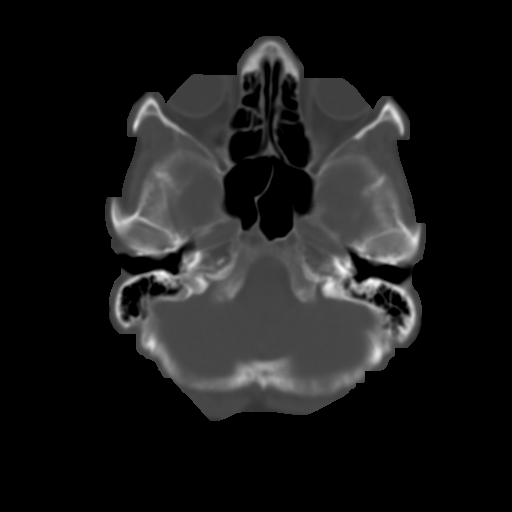
[im 8/29  brain]
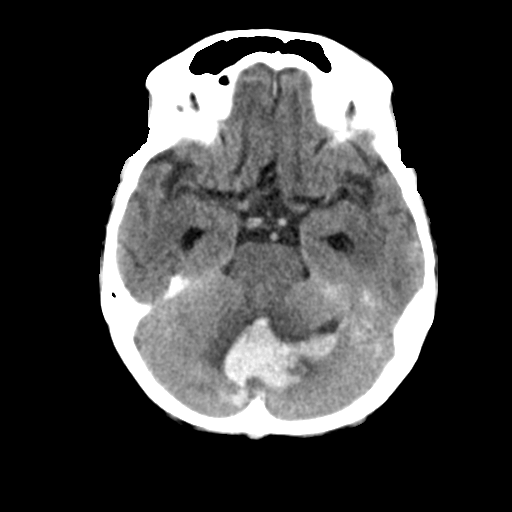
[im 11/29  brain]
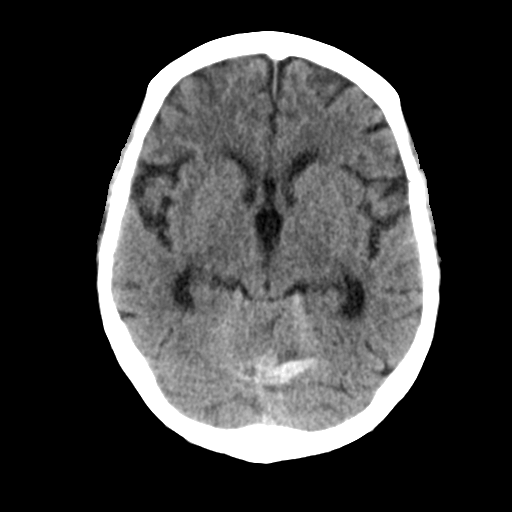
[im 15/29  brain]
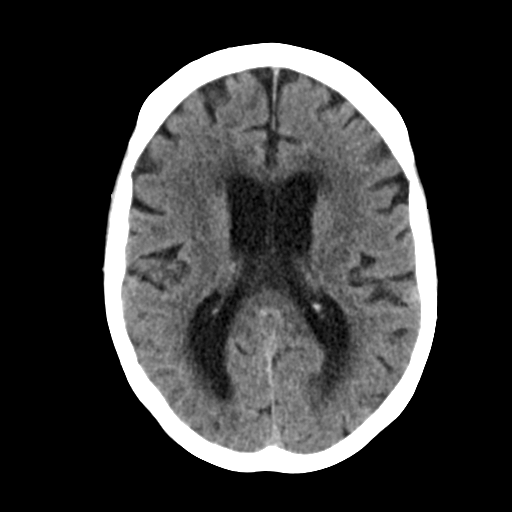
[im 18/29  brain]
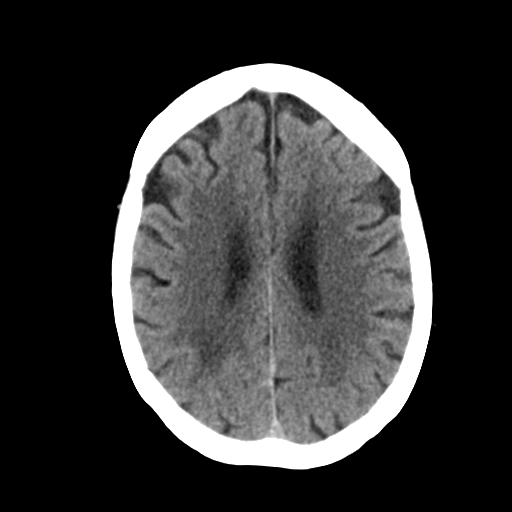
[im 18/29  bone]
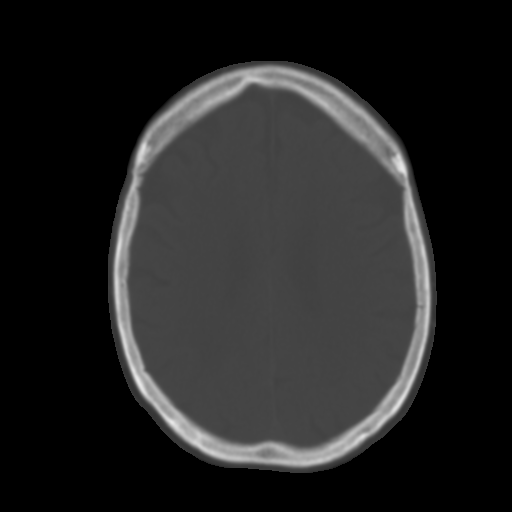
[im 22/29  brain]
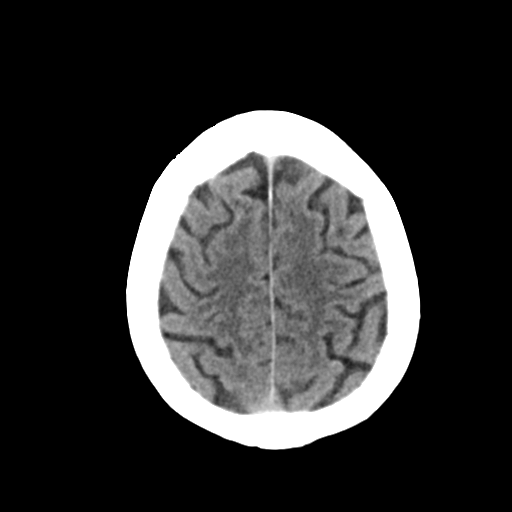
[im 25/29  brain]
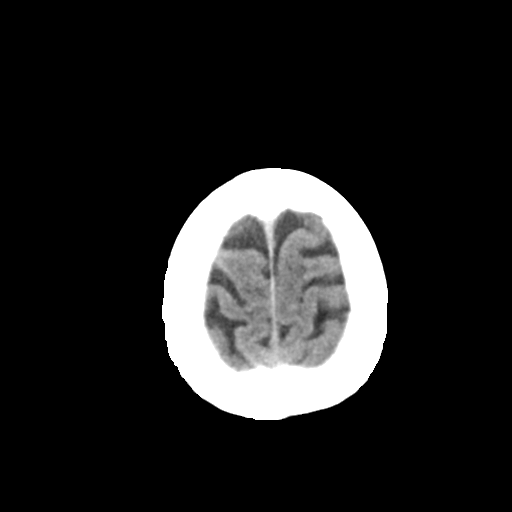

[Series 4: head bone · axial · 0.41mm/px · z∈[-112,-84]mm · 3 of 72 slices shown]
[im 8/72  bone]
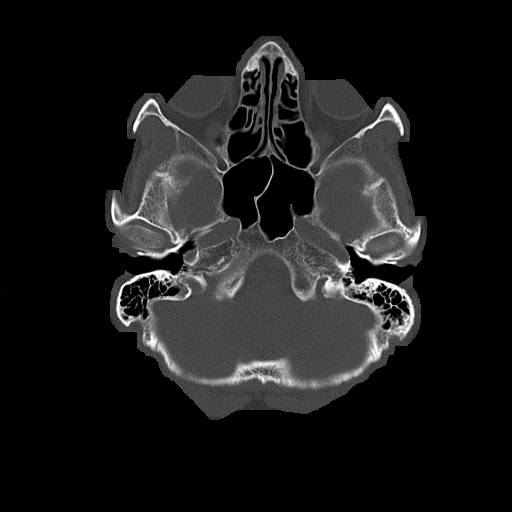
[im 15/72  bone]
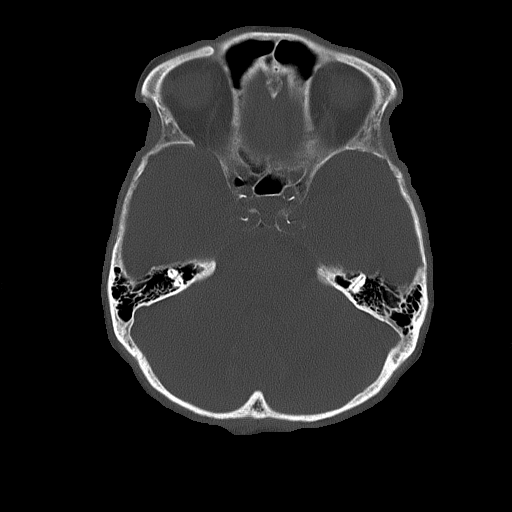
[im 22/72  bone]
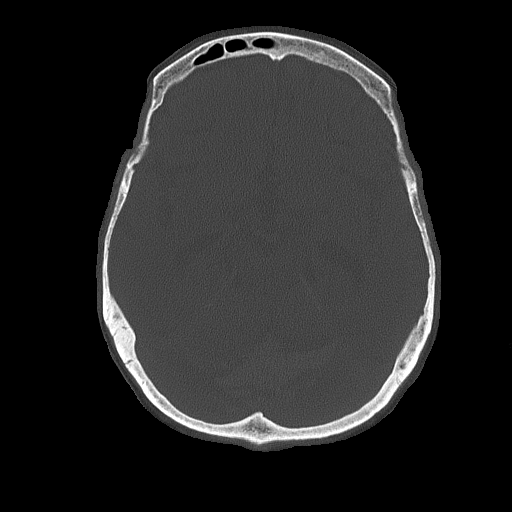

[Series 5: head without cor · coronal · non-contrast · 0.32mm/px · 3 of 63 slices shown]
[im 21/63  brain]
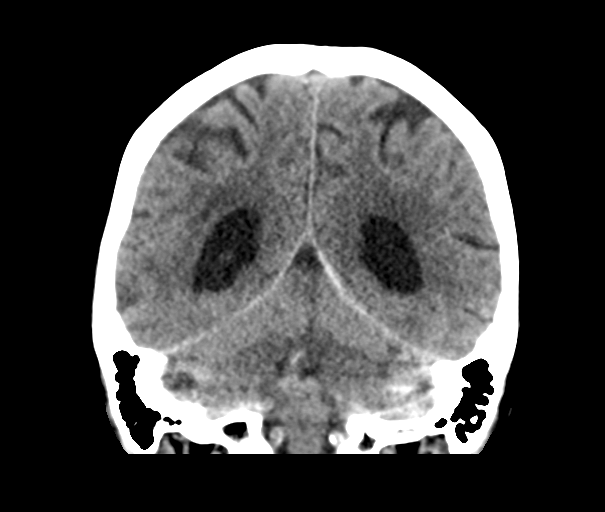
[im 28/63  brain]
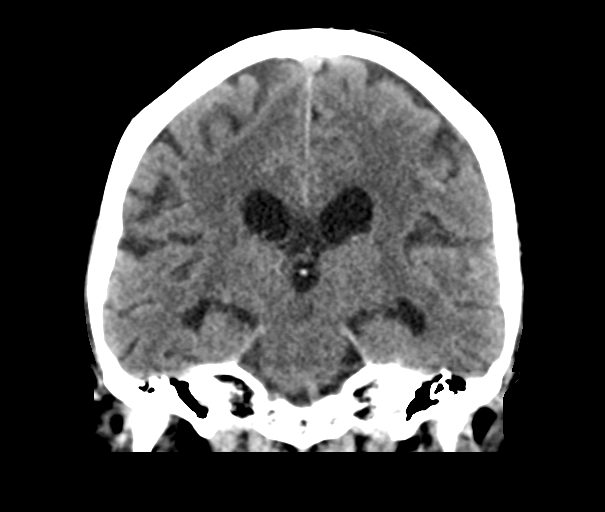
[im 35/63  brain]
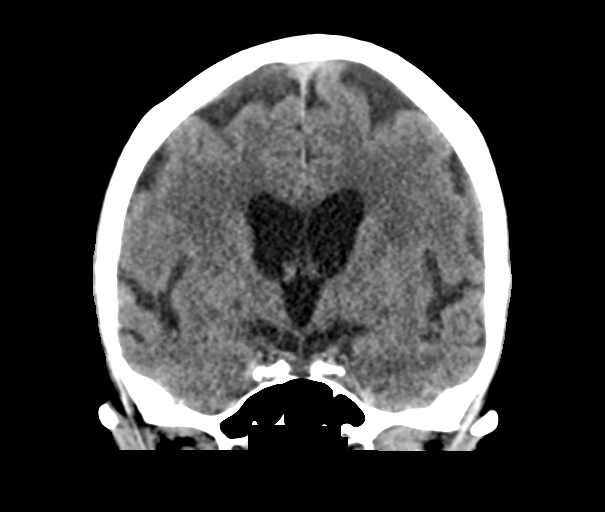

[Series 6: head without sag · sagittal · non-contrast · 0.32mm/px · 3 of 51 slices shown]
[im 17/51  brain]
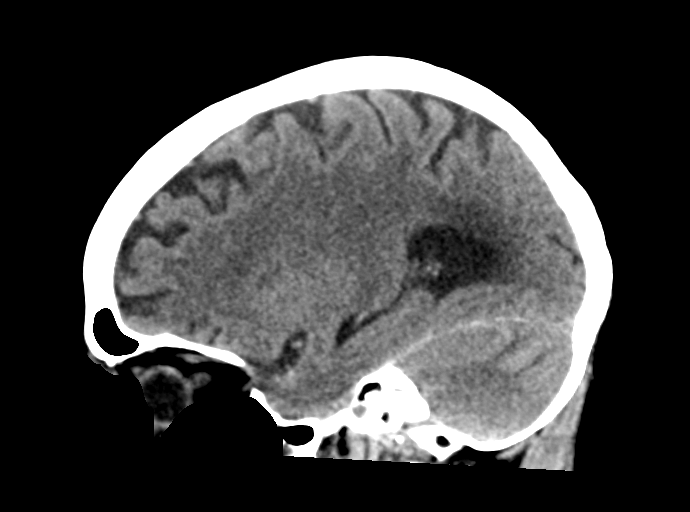
[im 26/51  brain]
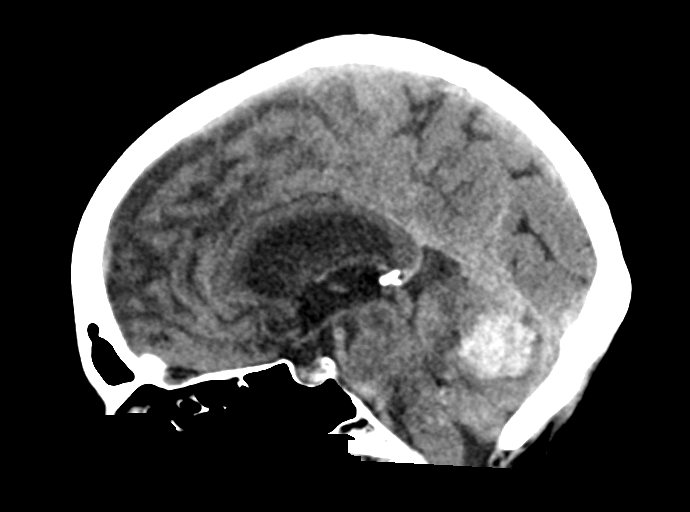
[im 34/51  brain]
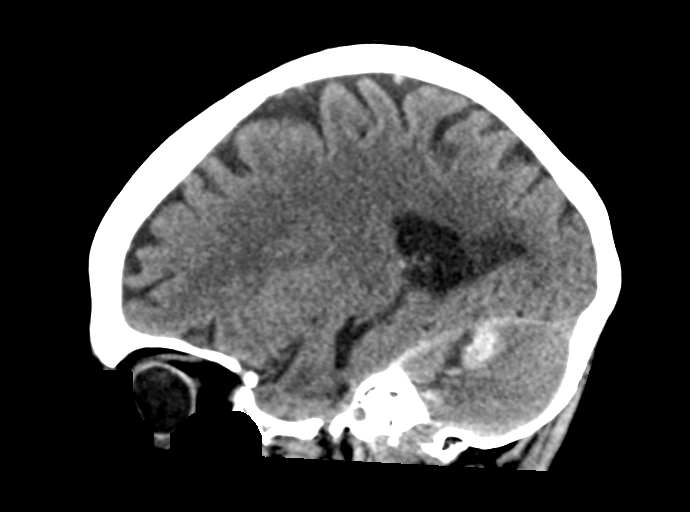

[16 of 47 positions shown; findings below may reference images not displayed]

FINDINGS: Brain: No evidence of additional bleeding. Intraparenchymal
hemorrhage within the left cerebellar hemisphere and cerebellar
vermis. Mild surrounding edema. Small amount of subarachnoid blood,
now migrated invisible within some of the sulci over the cerebral
hemispheres. Small amount of blood dependent in the occipital horns.
Ventricular size is stable.

Vascular: No primary vascular lesion evident.

Skull: Negative

Sinuses/Orbits: Clear/normal

Other: None
IMPRESSION: No evidence of additional or worsened cerebellar hemorrhage. Small
amount of subarachnoid and intraventricular blood as expected in
this case. No new or complicating feature. No sign of obstructive
hydrocephalus.

## 2019-06-12 MED ORDER — BUTALBITAL-APAP-CAFFEINE 50-325-40 MG PO TABS
1.0000 | ORAL_TABLET | Freq: Three times a day (TID) | ORAL | Status: DC | PRN
  Administered 2019-06-12 – 2019-06-14 (×5): 1 via ORAL
  Filled 2019-06-12 (×5): qty 1

## 2019-06-12 MED ORDER — TRAMADOL HCL 50 MG PO TABS
50.0000 mg | ORAL_TABLET | Freq: Four times a day (QID) | ORAL | Status: DC | PRN
  Administered 2019-06-12 – 2019-06-13 (×3): 50 mg via ORAL
  Filled 2019-06-12 (×3): qty 1

## 2019-06-12 MED ORDER — SODIUM CHLORIDE 0.9 % IV SOLN
500.0000 mg | Freq: Once | INTRAVENOUS | Status: AC
  Administered 2019-06-12: 19:00:00 500 mg via INTRAVENOUS
  Filled 2019-06-12: qty 4

## 2019-06-12 MED ORDER — HYDROMORPHONE HCL 1 MG/ML IJ SOLN
0.5000 mg | INTRAMUSCULAR | Status: AC | PRN
  Administered 2019-06-13 (×2): 0.5 mg via INTRAVENOUS
  Filled 2019-06-12 (×3): qty 0.5

## 2019-06-12 MED ORDER — AMLODIPINE BESYLATE 5 MG PO TABS
5.0000 mg | ORAL_TABLET | Freq: Once | ORAL | Status: AC
  Administered 2019-06-12: 5 mg via ORAL
  Filled 2019-06-12: qty 1

## 2019-06-12 MED ORDER — LABETALOL HCL 5 MG/ML IV SOLN: 10.0000 mg | INTRAVENOUS | Status: DC | PRN

## 2019-06-12 MED ORDER — AMLODIPINE BESYLATE 10 MG PO TABS
10.0000 mg | ORAL_TABLET | Freq: Every day | ORAL | Status: DC
  Administered 2019-06-13 – 2019-06-14 (×2): 10 mg via ORAL
  Filled 2019-06-12 (×2): qty 1

## 2019-06-12 NOTE — Progress Notes (Signed)
STROKE TEAM PROGRESS NOTE   INTERVAL HISTORY Pt is mildly lethargic due to worsening HA and LBP. Neuro exam stable. Stat CT showed stable hematoma and no hydrocephalus.   Vitals:   06/12/19 0350 06/12/19 0734 06/12/19 1036 06/12/19 1210  BP: (!) 148/77 (!) 153/75 137/72 140/81  Pulse: 67 (!) 59 61 62  Resp: 18 10  18   Temp: 98.2 F (36.8 C) (!) 97.4 F (36.3 C)  97.8 F (36.6 C)  TempSrc: Oral Oral    SpO2: 99% 100%  99%  Weight:      Height:        CBC:  Recent Labs  Lab 06/10/19 0519 06/10/19 0522 06/11/19 0241 06/12/19 0220  WBC 11.9*   < > 10.7* 6.6  NEUTROABS 10.7*  --   --   --   HGB 14.4   < > 12.7 12.7  HCT 43.3   < > 38.2 38.3  MCV 89.8   < > 89.5 90.5  PLT 299   < > 267 244   < > = values in this interval not displayed.    Basic Metabolic Panel:  Recent Labs  Lab 06/11/19 0241 06/12/19 0220  NA 133* 134*  K 4.6 5.0  CL 98 101  CO2 25 24  GLUCOSE 105* 109*  BUN 17 17  CREATININE 0.78 0.65  CALCIUM 9.4 9.3   Lipid Panel:     Component Value Date/Time   CHOL 219 (H) 06/11/2019 0241   TRIG 73 06/11/2019 0241   HDL 84 06/11/2019 0241   CHOLHDL 2.6 06/11/2019 0241   VLDL 15 06/11/2019 0241   LDLCALC 120 (H) 06/11/2019 0241   HgbA1c:  Lab Results  Component Value Date   HGBA1C 5.1 06/11/2019   Urine Drug Screen:     Component Value Date/Time   LABOPIA NONE DETECTED 06/10/2019 0623   COCAINSCRNUR NONE DETECTED 06/10/2019 0623   LABBENZ NONE DETECTED 06/10/2019 0623   AMPHETMU NONE DETECTED 06/10/2019 0623   THCU NONE DETECTED 06/10/2019 0623   LABBARB NONE DETECTED 06/10/2019 0623    Alcohol Level     Component Value Date/Time   ETH <10 06/10/2019 0519    IMAGING past 48 hours  CT ANGIO HEAD W OR WO CONTRAST 06/10/2019 IMPRESSION:  No posterior fossa aneurysm or AVM is identified. Consider repeat vascular imaging once the hematoma involutes for further assessment. No intracranial large vessel occlusion or proximal high-grade  arterial stenosis. 2 mm supraclinoid right ICA aneurysm.    ECHOCARDIOGRAM COMPLETE 06/10/2019 IMPRESSIONS   1. Left ventricular ejection fraction, by visual estimation, is 55 to 60%. The left ventricle has normal function. There is no left ventricular hypertrophy.   2. Left ventricular diastolic parameters are consistent with Grade I diastolic dysfunction (impaired relaxation).   3. The left ventricle has no regional wall motion abnormalities.   4. Global right ventricle has normal systolic function.The right ventricular size is normal. No increase in right ventricular wall thickness.   5. Left atrial size was normal.   6. Right atrial size was normal.   7. The mitral valve is normal in structure. Trivial mitral valve regurgitation. No evidence of mitral stenosis.   8. The tricuspid valve is normal in structure.   9. The tricuspid valve is normal in structure. Tricuspid valve regurgitation is not demonstrated.  10. The aortic valve is normal in structure. Aortic valve regurgitation is trivial. Mild aortic valve sclerosis without stenosis.  11. The pulmonic valve was normal in structure. Pulmonic valve  regurgitation is not visualized.  12. The inferior vena cava is normal in size with greater than 50% respiratory variability, suggesting right atrial pressure of 3 mmHg.    CT HEAD CODE STROKE WO CONTRAST 06/10/2019 IMPRESSION:  16 cc hematoma centered in the cerebellar vermis with mild subdural and subarachnoid extension. An underlying cause is not identified and vascular imaging is recommended when appropriate. There is narrowing of the fourth ventricle without hydrocephalus.    PHYSICAL EXAM  Temp:  [97.4 F (36.3 C)-98.4 F (36.9 C)] 97.8 F (36.6 C) (01/31 1210) Pulse Rate:  [59-72] 62 (01/31 1210) Resp:  [10-29] 18 (01/31 1210) BP: (90-159)/(64-83) 140/81 (01/31 1210) SpO2:  [96 %-100 %] 99 % (01/31 1210)  General - Thin built, well developed, in no apparent  distress.  Ophthalmologic - fundi not visualized due to noncooperation.  Cardiovascular - Regular rhythm and rate.  Mental Status -  Level of arousal and orientation to time, place, and person were intact. Language including expression, naming, repetition, comprehension was assessed and found intact. Mild dysarthria Fund of Knowledge was assessed and was intact.  Cranial Nerves II - XII - II - Visual field intact OU. III, IV, VI - Extraocular movements intact. V - Facial sensation intact bilaterally. VII - Facial movement intact bilaterally. VIII - Hearing intact bilaterally. No nystagmus today X - Palate elevates symmetrically. Mild dysathria XI - Chin turning & shoulder shrug intact bilaterally. XII - Tongue protrusion intact.  Motor Strength - The patient's strength was symmetrical in all extremities and pronator drift was absent.  Bulk was normal and fasciculations were absent.   Motor Tone - Muscle tone was assessed at the neck and appendages and was normal.  Reflexes - The patient's reflexes were symmetrical in all extremities and she had no pathological reflexes.  Sensory - Light touch, temperature/pinprick were assessed and were symmetrical.    Coordination - The patient has dysmetria on left FTN, no significant dysmetria on HTS bilaterally although slow action.  Tremor was absent.  Gait and Station - deferred.   ASSESSMENT/PLAN Tanya Knight is a 79 y.o. female with history of HTN and seizures presenting with sudden onset HA, nausea, vomiting, slurred speech and weakness resulting in a fall.   ICH:  Cerebellar vermis ICH with SDH and SAH, likely hypertensive  Code Stroke CT head 16cc hematoma cerebellar vermis w/ mild SDH and SAH. 4th ventricle narrowing w/o hydrocephalus  CTA head no AVM or aneurysm seen. No LVO or stenosis. 35mm supraclinoid R ICA aneurysm.   MRI - stable hematoma, SDH, SAH, trace IVH, no hydrocephalus, no obvious CAA  CT head 1/31 - stable  hematoma, no hydro  2D Echo - EF 55 to 60%. No cardiac source of emboli identified.   LDL - 120  HgbA1c - 5.1  UDS neg  Heparin subq for VTE prophylaxis  aspirin 81 mg daily prior to admission, now on No antithrombotic given hemorrhage   Therapy recommendations:  CIR    Disposition:  pending   HA and LBP  Could be related to elevated BP  Increase norvasc  CT no hydro and stable hematoma  On fioricet and tylenol PRN  Add tramadol PRN  Hypertension  Home meds:  losartan 100, amlodipine 2.5, metoprolol 50  Stable on the high end  Increase amlodipine dose  continue metoprolol . SBP goal < 160 . Labetalol PRN . Long-term BP goal normotensive  Hx of seizure  One time seizure in 1997  On depakote 500  bid - continue  Other Stroke Risk Factors  Advanced age  ETOH use, alcohol level <10, advised to drink no more than 1 drink(s) a day  Other Active Problems  Hypokalemia 3.2-3.3 - supplement->4.6->5.0 recheck in AM   Hx melanoma insitu LLE  Code status - Full code  Mild Leukocytosis - 10.7 (afebrile)->6.6 resolved  Hospital day # 2  Tanya Hawking, MD PhD Stroke Neurology 06/12/2019 5:08 PM   To contact Stroke Continuity provider, please refer to [email or web address] After hours, contact General Neurology

## 2019-06-12 NOTE — Progress Notes (Signed)
Tanya Knight questioned about pt's prn BP med parameters <140/90 to admin near start of shift. No return call from Tanya Knight.

## 2019-06-12 NOTE — Plan of Care (Signed)
  Problem: Education: Goal: Knowledge of General Education information will improve Description: Including pain rating scale, medication(s)/side effects and non-pharmacologic comfort measures Outcome: Progressing   Problem: Health Behavior/Discharge Planning: Goal: Ability to manage health-related needs will improve Outcome: Progressing   Problem: Clinical Measurements: Goal: Ability to maintain clinical measurements within normal limits will improve Outcome: Progressing Goal: Will remain free from infection Outcome: Progressing Goal: Diagnostic test results will improve Outcome: Progressing Goal: Respiratory complications will improve Outcome: Progressing Goal: Cardiovascular complication will be avoided Outcome: Progressing   Problem: Activity: Goal: Risk for activity intolerance will decrease Outcome: Progressing   Problem: Nutrition: Goal: Adequate nutrition will be maintained Outcome: Progressing   Problem: Coping: Goal: Level of anxiety will decrease Outcome: Progressing   Problem: Elimination: Goal: Will not experience complications related to bowel motility Outcome: Progressing Goal: Will not experience complications related to urinary retention Outcome: Progressing   Problem: Pain Managment: Goal: General experience of comfort will improve Outcome: Progressing   Problem: Safety: Goal: Ability to remain free from injury will improve Outcome: Progressing   Problem: Skin Integrity: Goal: Risk for impaired skin integrity will decrease Outcome: Progressing   Problem: Education: Goal: Knowledge of secondary prevention will improve Outcome: Progressing Goal: Knowledge of patient specific risk factors addressed and post discharge goals established will improve Outcome: Progressing   Problem: Coping: Goal: Will verbalize positive feelings about self Outcome: Progressing   Problem: Self-Care: Goal: Ability to participate in self-care as condition permits  will improve Outcome: Progressing Goal: Ability to communicate needs accurately will improve Outcome: Progressing   Problem: Nutrition: Goal: Risk of aspiration will decrease Outcome: Progressing Goal: Dietary intake will improve Outcome: Progressing   Problem: Intracerebral Hemorrhage Tissue Perfusion: Goal: Complications of Intracerebral Hemorrhage will be minimized Outcome: Progressing

## 2019-06-12 NOTE — Evaluation (Signed)
Occupational Therapy Evaluation Patient Details Name: Tanya Knight MRN: [id number] DOB: 01-13-1941 Today's Date: 06/12/2019    History of Present Illness [name] is a 79 y.o. female with history of HTN and seizures presenting with sudden onset HA, nausea, vomiting, slurred speech and weakness resulting in a fall. MRI with intraparenchymal hemorrahge in left cerebellar hemisphere and cerebellar vermis.    Clinical Impression   This 79 y/o female presents with the above. PTA pt reports independence with ADL and functional mobility, living with spouse. Pt presenting with continuous headache, weakness, decreased balance impacting her functional performance. Pt overall requiring two person assist for safe completion of functional transfers and short distance mobility in room; requiring up to [facility] for toileting and LB ADL. Pt also reporting intermittent diplopia but denies having during this session, will continue to assess. BP 155/81 while seated upright during session. She will benefit from continued acute OT services, feel she will benefit from CIR level therapy services at time of discharge to maximize her safety and independence with ADL and mobility. Will follow.     Follow Up Recommendations  CIR;Supervision/Assistance - 24 hour    Equipment Recommendations  Other (comment)(TBD)           Precautions / Restrictions Precautions Precautions: Fall Restrictions Weight Bearing Restrictions: No      Mobility Bed Mobility Overal bed mobility: Needs Assistance Bed Mobility: Supine to Sit     Supine to sit: Min guard        Transfers Overall transfer level: Needs assistance Equipment used: 1 person hand held assist Transfers: Sit to/from [facility] Sit to Stand: Min assist;+2 safety/equipment Stand pivot transfers: Mod assist;+2 safety/equipment       General transfer comment: MinA to rise from edge of bed, modA + 2 to pivot towards left onto  [facility]. Noted decreased eccentric control    Balance Overall balance assessment: Needs assistance Sitting-balance support: Feet supported Sitting balance-Leahy Scale: Fair Sitting balance - Comments: requiring min guard-minA for donning socks   Standing balance support: Bilateral upper extremity supported Standing balance-Leahy Scale: Poor Standing balance comment: reliant on handheld assist                           ADL either performed or assessed with clinical judgement   ADL Overall ADL's : Needs assistance/impaired Eating/Feeding: Set up;Sitting   Grooming: Set up;Min guard;Sitting   Upper Body Bathing: Min guard;Sitting   Lower Body Bathing: Moderate assistance;Sit to/from stand   Upper Body Dressing : Min guard;Sitting   Lower Body Dressing: Moderate assistance;Sit to/from stand Lower Body Dressing Details (indicate cue type and reason): pt was able to don socks seated EOB given increased time/effort Toilet Transfer: Moderate assistance;+2 for physical assistance;+2 for safety/equipment;Stand-pivot;BSC   Toileting- Clothing Manipulation and Hygiene: Moderate assistance;+2 for physical assistance;+2 for safety/equipment;Sit to/from stand;Sitting/lateral lean Toileting - Clothing Manipulation Details (indicate cue type and reason): pt able to perform some pericare vial lateral leans, assist for gown management     Functional mobility during ADLs: Moderate assistance;+2 for physical assistance;+2 for safety/equipment(HHA) General ADL Comments: pt with weakness, pain, decreased activity tolerance and standing balance     Vision Baseline Vision/History: No visual deficits Patient Visual Report: Diplopia(intermittent) Additional Comments: will continue to assess, pt reports intermittent diplopia, currently not experiencing during this session     Perception     Praxis      Pertinent Vitals/Pain Pain Assessment: 0-10 Pain  Score: 8  Pain Location:  headache Pain Descriptors / Indicators: Headache Pain Intervention(s): Limited activity within patient's tolerance;Monitored during session;Patient requesting pain meds-RN notified     Hand Dominance     Extremity/Trunk Assessment Upper Extremity Assessment Upper Extremity Assessment: Generalized weakness   Lower Extremity Assessment Lower Extremity Assessment: Defer to PT evaluation       Communication Communication Communication: No difficulties   Cognition Arousal/Alertness: Awake/alert Behavior During Therapy: WFL for tasks assessed/performed Overall Cognitive Status: Impaired/Different from baseline Area of Impairment: Awareness;Safety/judgement                         Safety/Judgement: Decreased awareness of deficits Awareness: Emergent   General Comments: Pleasant, follows all commands. Decreased insight into deficits/safety.   General Comments  BP 155/81 in sitting    Exercises     Shoulder Instructions      Home Living Family/patient expects to be discharged to:: Private residence Living Arrangements: Spouse/significant other Available Help at Discharge: Family;Available 24 hours/day Type of Home: House Home Access: Stairs to enter [employer] of Steps: 3 Entrance Stairs-Rails: Right;Left Home Layout: One level     Bathroom Shower/Tub: Teacher, early years/pre: Standard     Home Equipment: Environmental consultant - 2 wheels;Kasandra Knudsen - single point      Lives With: Spouse    Prior Functioning/Environment Level of Independence: Independent                 OT Problem List: Decreased strength;Decreased range of motion;Decreased activity tolerance;Impaired balance (sitting and/or standing);Decreased safety awareness;Decreased knowledge of use of DME or AE;Pain;Decreased cognition;Impaired vision/perception      OT Treatment/Interventions: Self-care/ADL training;Therapeutic exercise;Energy conservation;DME and/or AE  instruction;Therapeutic activities;Patient/family education;Balance training;Visual/perceptual remediation/compensation;Cognitive remediation/compensation;Neuromuscular education    OT Goals(Current goals can be found in the care plan section) Acute Rehab OT Goals Patient Stated Goal: no headache OT Goal Formulation: With patient Time For Goal Achievement: 06/26/19 Potential to Achieve Goals: Good  OT Frequency: Min 2X/week   Barriers to D/C:            Co-evaluation PT/OT/SLP Co-Evaluation/Treatment: Yes Reason for Co-Treatment: For patient/therapist safety;To address functional/ADL transfers   OT goals addressed during session: ADL's and self-care      AM-PAC OT "6 Clicks" Daily Activity     Outcome Measure Help from another person eating meals?: A Little Help from another person taking care of personal grooming?: A Little Help from another person toileting, which includes using toliet, bedpan, or urinal?: A Lot Help from another person bathing (including washing, rinsing, drying)?: A Lot Help from another person to put on and taking off regular upper body clothing?: A Little Help from another person to put on and taking off regular lower body clothing?: A Lot 6 Click Score: 15   End of Session Equipment Utilized During Treatment: Gait belt Nurse Communication: Mobility status  Activity Tolerance: Patient tolerated treatment well Patient left: in chair;with call bell/phone within reach;with chair alarm set  OT Visit Diagnosis: Muscle weakness (generalized) (M62.81);Unsteadiness on feet (R26.81);Pain Pain - part of body: (head)                Time: [id number] OT Time Calculation (min): 23 min Charges:  OT General Charges $OT Visit: 1 Visit OT Evaluation $OT Eval Moderate Complexity: 1 Mod  Lou Cal, OT [employer] Pager [phone] Office [phone]   Raymondo Band 06/12/2019, 2:07 PM

## 2019-06-12 NOTE — Progress Notes (Signed)
Rehab Admissions Coordinator Note:  Patient was screened by Cleatrice Burke for appropriateness for an Inpatient Acute Rehab Consult per PT recs.   At this time, we are recommending Inpatient Rehab consult. I will place order per protocol.  Cleatrice Burke RN MSN  06/12/2019, 9:01 AM  I can be reached at [phone].

## 2019-06-12 NOTE — Progress Notes (Addendum)
Spoke with chrg nurse pertaining to pt's prn labetalol order and it's clearness and it's affect on pt's HR. Chrg nurse advised to attempt to contact [facility] again. [facility] returned page and updated prn order. I appreciate the collaboration with chrg nurse and MD.

## 2019-06-12 NOTE — Progress Notes (Signed)
Physical Therapy Treatment Patient Details Name: Tanya Knight MRN: [id number] DOB: 04/29/1941 Today's Date: 06/12/2019    History of Present Illness [name] is a 79 y.o. female with history of HTN and seizures presenting with sudden onset HA, nausea, vomiting, slurred speech and weakness resulting in a fall. MRI with intraparenchymal hemorrahge in left cerebellar hemisphere and cerebellar vermis.     PT Comments    Pt making steady progress towards her physical therapy goals. Reports continued headache and mild nausea but agreeable to participate. Pt ambulating 10 feet with two person assist. BP 155/81. Displays decreased balance, coordination, and gait abnormalities. Suspect continued progress based on PLOF and motivation; CIR remains appropriate to address deficits and maximize functional independence.    Follow Up Recommendations  CIR     Equipment Recommendations  Rolling walker with 5" wheels    Recommendations for Other Services Rehab consult     Precautions / Restrictions Precautions Precautions: Fall Restrictions Weight Bearing Restrictions: No    Mobility  Bed Mobility Overal bed mobility: Needs Assistance Bed Mobility: Supine to Sit     Supine to sit: Min guard        Transfers Overall transfer level: Needs assistance Equipment used: 1 person hand held assist Transfers: Sit to/from [employer] Transfers Sit to Stand: Min assist;+2 safety/equipment Stand pivot transfers: Mod assist;+2 safety/equipment       General transfer comment: MinA to rise from edge of bed, modA + 2 to pivot towards left onto [facility]. Noted decreased eccentric control  Ambulation/Gait Ambulation/Gait assistance: Mod assist;+2 physical assistance;+2 safety/equipment Gait Distance (Feet): 10 Feet Assistive device: 2 person hand held assist Gait Pattern/deviations: Step-through pattern;Decreased stride length;Ataxic;Trunk flexed;Shuffle Gait velocity:  decreased Gait velocity interpretation: <1.31 ft/sec, indicative of household ambulator General Gait Details: Requiring modA + 2 for dynamic stability; noted decreased truncal control, increased lateral sway. Cues provided for sequencing/direction and larger step lengths.   Stairs             Wheelchair Mobility    Modified Rankin (Stroke Patients Only) Modified Rankin (Stroke Patients Only) Pre-Morbid Rankin Score: No symptoms Modified Rankin: Moderately severe disability     Balance Overall balance assessment: Needs assistance Sitting-balance support: Feet supported Sitting balance-Leahy Scale: Fair Sitting balance - Comments: requiring min guard-minA for donning socks   Standing balance support: Bilateral upper extremity supported Standing balance-Leahy Scale: Poor Standing balance comment: reliant on handheld assist                            Cognition Arousal/Alertness: Awake/alert Behavior During Therapy: WFL for tasks assessed/performed Overall Cognitive Status: Impaired/Different from baseline Area of Impairment: Awareness;Safety/judgement                         Safety/Judgement: Decreased awareness of deficits Awareness: Emergent   General Comments: Pleasant, follows all commands. Decreased insight into deficits/safety.      Exercises      General Comments        Pertinent Vitals/Pain Pain Assessment: 0-10 Pain Score: 8  Pain Location: headache Pain Descriptors / Indicators: Headache Pain Intervention(s): Monitored during session;Patient requesting pain meds-RN notified;Limited activity within patient's tolerance    Home Living                      Prior Function            PT Goals (current  goals can now be found in the care plan section) Acute Rehab PT Goals Patient Stated Goal: no headache PT Goal Formulation: With patient Time For Goal Achievement: 06/25/19 Potential to Achieve Goals: Good Progress  towards PT goals: Progressing toward goals    Frequency    Min 4X/week      PT Plan Current plan remains appropriate    Co-evaluation PT/OT/SLP Co-Evaluation/Treatment: Yes Reason for Co-Treatment: For patient/therapist safety;To address functional/ADL transfers          AM-PAC PT "6 Clicks" Mobility   Outcome Measure  Help needed turning from your back to your side while in a flat bed without using bedrails?: None Help needed moving from lying on your back to sitting on the side of a flat bed without using bedrails?: A Little Help needed moving to and from a bed to a chair (including a wheelchair)?: A Little Help needed standing up from a chair using your arms (e.g., wheelchair or bedside chair)?: A Little Help needed to walk in hospital room?: A Lot Help needed climbing 3-5 steps with a railing? : A Lot 6 Click Score: 17    End of Session Equipment Utilized During Treatment: Gait belt Activity Tolerance: Patient tolerated treatment well Patient left: in chair;with call bell/phone within reach;with chair alarm set Nurse Communication: Mobility status PT Visit Diagnosis: Unsteadiness on feet (R26.81);Ataxic gait (R26.0);Difficulty in walking, not elsewhere classified (R26.2);Other symptoms and signs involving the nervous system [id number])     Time: [id number] PT Time Calculation (min) (ACUTE ONLY): 23 min  Charges:  $Gait Training: 8-22 mins                       Wyona Almas, PT, DPT Acute Rehabilitation Services Pager [phone] Office [phone]    Deno Etienne 06/12/2019, 10:06 AM

## 2019-06-13 ENCOUNTER — Other Ambulatory Visit: Payer: Self-pay

## 2019-06-13 DIAGNOSIS — I639 Cerebral infarction, unspecified: Secondary | ICD-10-CM

## 2019-06-13 DIAGNOSIS — I614 Nontraumatic intracerebral hemorrhage in cerebellum: Principal | ICD-10-CM

## 2019-06-13 HISTORY — DX: Cerebral infarction, unspecified: I63.9

## 2019-06-13 LAB — CBC
HCT: 39.1 % (ref 36.0–46.0)
Hemoglobin: 13.2 g/dL (ref 12.0–15.0)
MCH: 30 pg (ref 26.0–34.0)
MCHC: 33.8 g/dL (ref 30.0–36.0)
MCV: 88.9 fL (ref 80.0–100.0)
Platelets: 262 10*3/uL (ref 150–400)
RBC: 4.4 MIL/uL (ref 3.87–5.11)
RDW: 13.4 % (ref 11.5–15.5)
WBC: 4.6 10*3/uL (ref 4.0–10.5)
nRBC: 0 % (ref 0.0–0.2)

## 2019-06-13 LAB — BASIC METABOLIC PANEL
Anion gap: 11 (ref 5–15)
BUN: 18 mg/dL (ref 8–23)
CO2: 23 mmol/L (ref 22–32)
Calcium: 9.2 mg/dL (ref 8.9–10.3)
Chloride: 100 mmol/L (ref 98–111)
Creatinine, Ser: 0.75 mg/dL (ref 0.44–1.00)
GFR calc Af Amer: >60 mL/min (ref >60)
GFR calc non Af Amer: >60 mL/min (ref >60)
Glucose, Bld: 157 mg/dL — ABNORMAL HIGH (ref 70–99)
Potassium: 4.6 mmol/L (ref 3.5–5.1)
Sodium: 134 mmol/L — ABNORMAL LOW (ref 135–145)

## 2019-06-13 MED ORDER — LOSARTAN POTASSIUM 100 MG PO TABS: ORAL_TABLET | ORAL | 0 refills | Status: DC

## 2019-06-13 NOTE — Progress Notes (Signed)
Inpatient Rehabilitation Admissions Coordinator  Inpatient rehab consult received. I will begin insurance authorization with [facility] and then meet with patient and family.  Danne Baxter, RN, MSN Rehab Admissions Coordinator [phone] 06/13/2019 1:52 PM

## 2019-06-13 NOTE — Plan of Care (Signed)
  Problem: Education: Goal: Knowledge of General Education information will improve Description: Including pain rating scale, medication(s)/side effects and non-pharmacologic comfort measures Outcome: Progressing   Problem: Health Behavior/Discharge Planning: Goal: Ability to manage health-related needs will improve Outcome: Progressing   Problem: Clinical Measurements: Goal: Ability to maintain clinical measurements within normal limits will improve Outcome: Progressing Goal: Will remain free from infection Outcome: Progressing Goal: Diagnostic test results will improve Outcome: Progressing Goal: Respiratory complications will improve Outcome: Progressing Goal: Cardiovascular complication will be avoided Outcome: Progressing   Problem: Activity: Goal: Risk for activity intolerance will decrease Outcome: Progressing   Problem: Nutrition: Goal: Adequate nutrition will be maintained Outcome: Progressing   Problem: Coping: Goal: Level of anxiety will decrease Outcome: Progressing   Problem: Elimination: Goal: Will not experience complications related to bowel motility Outcome: Progressing Goal: Will not experience complications related to urinary retention Outcome: Progressing   Problem: Pain Managment: Goal: General experience of comfort will improve Outcome: Progressing   Problem: Safety: Goal: Ability to remain free from injury will improve Outcome: Progressing   Problem: Skin Integrity: Goal: Risk for impaired skin integrity will decrease Outcome: Progressing   Problem: Education: Goal: Knowledge of secondary prevention will improve Outcome: Progressing Goal: Knowledge of patient specific risk factors addressed and post discharge goals established will improve Outcome: Progressing   Problem: Coping: Goal: Will verbalize positive feelings about self Outcome: Progressing   Problem: Self-Care: Goal: Ability to participate in self-care as condition permits  will improve Outcome: Progressing Goal: Ability to communicate needs accurately will improve Outcome: Progressing   Problem: Nutrition: Goal: Risk of aspiration will decrease Outcome: Progressing Goal: Dietary intake will improve Outcome: Progressing   Problem: Intracerebral Hemorrhage Tissue Perfusion: Goal: Complications of Intracerebral Hemorrhage will be minimized Outcome: Progressing

## 2019-06-13 NOTE — PMR Pre-admission (Signed)
PMR Admission Coordinator Pre-Admission Assessment  Patient: Tanya Knight is an 79 y.o., female MRN: [id number] DOB: 1940/07/05 Height: '5\' 1"'  (154.9 cm) Weight: 55 kg              Insurance Information HMO:     PPO: yes     PCP:      IPA:      80/20:      OTHER:  PRIMARY: [employer]      Policy#: [id number]      Subscriber: pt CM Name: Shirlean Mylar      Phone#: [phone] ext [phone]     Fax#: [phone] Pre-Cert#: [id number] approved for 7 days with f/u with Jeanette Caprice ext [phone] same fax     Employer:  Benefits:  Phone #: [phone]     Name: 2/2 Eff. Date: 05/13/2019     Deduct: none      Out of Pocket Max: $4000      Life Max: none CIR: $160 co pay per day days 1 until 10      SNF: no co pay days 1 until 20; $50 co pay per day days 21 until 100 Outpatient: $20 per visit     Co-Pay: visits per medical neccesity Home Health: 100%      Co-Pay: visits per medical neccesity DME: 80%     Co-Pay: 20% Providers: in network  SECONDARY: none        Medicaid Application Date:       Case Manager:  Disability Application Date:       Case Worker:   The "Data Collection Information Summary" for patients in Inpatient Rehabilitation Facilities with attached "Privacy Act [place] Records" was provided and verbally reviewed with: Patient and Family  Emergency Contact Information Contact Information    Name Relation Home Work [address]   Rosela, Supak Son   [phone]   Clotilde, Loth [phone]       Current Medical History  Patient Admitting Diagnosis: ICH  History of Present Illness: 79 y.o. right-handed female with history of hypertension, seizure disorder maintained on Depakote.    Independent prior to admission.   Presented 06/10/2019 with headache, nausea/vomiting, weakness and slurred speech.  Cranial CT scan showed a 16 cc hematoma centered in the cerebellar vermis with mild subdural and subarachnoid extension.  No hydrocephalus.  Blood pressure 532/023 with systolic  increasing into the 160's and started on Cleviprex.  Patient had been on aspirin prior to admission and held due to hematoma.  Admission chemistry sodium 134, potassium 3.2, urine drug screen negative, WBC 11,900.  CT angiogram showed no posterior fossa aneurysm or AVM identified.  Follow-up CT/MRI shows no evidence of additional or worsened cerebellar hemorrhage.  Neurology follow-up close monitoring of blood pressure.  Subcutaneous heparin initiated for DVT prophylaxis 06/11/2019.  Patient remains on Depakote 500 mg every 12 hours as prior to admission.  Tolerating a regular diet.    Complete NIHSS TOTAL: 1 Glasgow Coma Scale Score: 15  Past Medical History  Past Medical History:  Diagnosis Date  . Fracture of metatarsal    Repair of fractured R metatarsal --Dr Duda---4/08  . GERD (gastroesophageal reflux disease)   . Hypertension   . Melanoma in situ [facility]) 7/17   Dr Kellie Moor  . Osteoporosis   . Seizure disorder ([place])   . Seizures ([place])   . Vitamin D deficiency     Family History  family history includes Breast cancer in her paternal aunt; Breast cancer (age of onset: 10) in  her daughter; Coronary artery disease in her paternal aunt and paternal uncle; Diabetes in her maternal aunt; Heart disease in her father, maternal grandfather, maternal grandmother, paternal grandfather, and paternal grandmother; Hypertension in her mother.  Prior Rehab/Hospitalizations:  Has the patient had prior rehab or hospitalizations prior to admission? Yes  Has the patient had major surgery during 100 days prior to admission? No  Current Medications   Current Facility-Administered Medications:  .   stroke: mapping our early stages of recovery book, , Does not apply, Once, Aroor, Karena Addison R, MD .  acetaminophen (TYLENOL) tablet 650 mg, 650 mg, Oral, Q4H PRN, 650 mg at 06/12/19 0644 **OR** acetaminophen (TYLENOL) 160 MG/5ML solution 650 mg, 650 mg, Per Tube, Q4H PRN **OR** acetaminophen (TYLENOL)  suppository 650 mg, 650 mg, Rectal, Q4H PRN, Aroor, Karena Addison R, MD .  amLODipine (NORVASC) tablet 10 mg, 10 mg, Oral, Daily, Rosalin Hawking, MD, 10 mg at 06/14/19 1039 .  butalbital-acetaminophen-caffeine (FIORICET) 50-325-40 MG per tablet 1 tablet, 1 tablet, Oral, Q8H PRN, Rosalin Hawking, MD, 1 tablet at 06/13/19 1608 .  Chlorhexidine Gluconate Cloth 2 % PADS 6 each, 6 each, Topical, Daily, Rosalin Hawking, MD, 6 each at 06/14/19 1038 .  divalproex (DEPAKOTE) DR tablet 500 mg, 500 mg, Oral, Q12H, Aroor, Karena Addison R, MD, 500 mg at 06/14/19 1039 .  heparin injection 5,000 Units, 5,000 Units, Subcutaneous, Q12H, Rosalin Hawking, MD, 5,000 Units at 06/14/19 1039 .  labetalol (NORMODYNE) injection 10-20 mg, 10-20 mg, Intravenous, Q10 min PRN, Aroor, Karena Addison R, MD .  metoprolol succinate (TOPROL-XL) 24 hr tablet 50 mg, 50 mg, Oral, Daily, Aroor, Karena Addison R, MD, 50 mg at 06/13/19 1608 .  ondansetron (ZOFRAN) injection 2 mg, 2 mg, Intravenous, Q6H PRN, Biby, Sharon L, NP, 2 mg at 06/14/19 1043 .  pantoprazole (PROTONIX) EC tablet 40 mg, 40 mg, Oral, Daily, Rosalin Hawking, MD, 40 mg at 06/14/19 1039 .  senna-docusate (Senokot-S) tablet 1 tablet, 1 tablet, Oral, BID, Aroor, Lanice Schwab, MD, 1 tablet at 06/14/19 1039 .  traMADol (ULTRAM) tablet 50 mg, 50 mg, Oral, Q6H PRN, Rosalin Hawking, MD, 50 mg at 06/13/19 1802  Patients Current Diet:  Diet Order            Diet regular Room service appropriate? Yes with Assist; Fluid consistency: Thin  Diet effective now              Precautions / Restrictions Precautions Precautions: Fall Restrictions Weight Bearing Restrictions: No   Has the patient had 2 or more falls or a fall with injury in the past year?No  Prior Activity Level Community (5-7x/wk): Independent  Prior Functional Level Prior Function Level of Independence: Independent  Self Care: Did the patient need help bathing, dressing, using the toilet or eating?  Independent  Indoor Mobility: Did the patient  need assistance with walking from room to room (with or without device)? Independent  Stairs: Did the patient need assistance with internal or external stairs (with or without device)? Independent  Functional Cognition: Did the patient need help planning regular tasks such as shopping or remembering to take medications? Independent  Home Assistive Devices / Equipment Home Assistive Devices/Equipment: None Home Equipment: Walker - 2 wheels, Cane - single point  Prior Device Use: Indicate devices/aids used by the patient prior to current illness, exacerbation or injury? None of the above  Current Functional Level Cognition  Arousal/Alertness: Lethargic Overall Cognitive Status: Impaired/Different from baseline Orientation Level: Oriented X4 Safety/Judgement: Decreased awareness of deficits General Comments: pt following  all commands, appears to have decreased insight into deficits Attention: Sustained Sustained Attention: Appears intact Memory: Impaired Memory Impairment: Decreased short term memory Decreased Short Term Memory: Verbal basic Problem Solving: Appears intact Safety/Judgment: Appears intact    Extremity Assessment (includes Sensation/Coordination)  Upper Extremity Assessment: Generalized weakness  Lower Extremity Assessment: Defer to PT evaluation    ADLs  Overall ADL's : Needs assistance/impaired Eating/Feeding: Set up, Sitting Grooming: Set up, Sitting, Oral care, Wash/dry face Grooming Details (indicate cue type and reason): completed sitting due to BP Upper Body Bathing: Min guard, Sitting Lower Body Bathing: Moderate assistance, Sit to/from stand Upper Body Dressing : Min guard, Sitting Lower Body Dressing: Set up Lower Body Dressing Details (indicate cue type and reason): donned socks while sitting in bed Toilet Transfer: +2 for physical assistance, +2 for safety/equipment, RW, Ambulation, Minimal assistance Toilet Transfer Details (indicate cue type and  reason): simulated Toileting- Clothing Manipulation and Hygiene: +2 for physical assistance, +2 for safety/equipment, Minimal assistance Toileting - Clothing Manipulation Details (indicate cue type and reason): pt able to perform some pericare vial lateral leans, assist for gown management Functional mobility during ADLs: +2 for physical assistance, +2 for safety/equipment, Rolling walker, Minimal assistance General ADL Comments: pt limited by orthostatic BP;decreased proprioception due to diplopia    Mobility  Overal bed mobility: Needs Assistance Bed Mobility: Supine to Sit, Sit to Supine Supine to sit: HOB elevated, Max assist, +2 for physical assistance Sit to supine: Total assist, +2 for physical assistance General bed mobility comments: +2 max assist to raise trunk and pivot hips to edge of bed, pt limited by dizziness/nausea, +2 total for returning to supine, BP 158/82 supine, 154/84 sitting, pt reported increased dizziness in sitting and that it was worse today than yesterday    Transfers  Overall transfer level: Needs assistance Equipment used: Rolling walker (2 wheeled) Transfers: Sit to/from Stand Sit to Stand: Min assist, +2 safety/equipment Stand pivot transfers: Mod assist, +2 safety/equipment General transfer comment: NT -pt dizzy/nauseous in sitting    Ambulation / Gait / Stairs / Wheelchair Mobility  Ambulation/Gait Ambulation/Gait assistance: Min assist, +2 physical assistance Gait Distance (Feet): 10 Feet Assistive device: Rolling walker (2 wheeled) Gait Pattern/deviations: Step-through pattern, Decreased stride length, Ataxic, Trunk flexed, Shuffle General Gait Details: Deferred further gt training as patient was orthostatic and symptomatic. Gait velocity: decreased Gait velocity interpretation: <1.31 ft/sec, indicative of household ambulator    Posture / Balance Dynamic Sitting Balance Sitting balance - Comments: posterior lean initially requiring min A then  fair Balance Overall balance assessment: Needs assistance Sitting-balance support: Feet supported, Single extremity supported Sitting balance-Leahy Scale: Poor Sitting balance - Comments: posterior lean initially requiring min A then fair Standing balance support: Bilateral upper extremity supported Standing balance-Leahy Scale: Poor Standing balance comment: reliant on external support    Special needs/care consideration BiPAP/CPAP CPM Continuous Drip IV Dialysis         Life Vest Oxygen Special Bed Trach Size Wound Vac  Skin                             Bowel mgmt:continent Bladder mgmt: external catheter Diabetic mgmt Behavioral consideration  Chemo/radiation  Visitor is Aram Beecham, son    Previous Home Environment  Living Arrangements: Spouse/significant other  Lives With: Spouse Available Help at Discharge: Family, Available 24 hours/day Type of Home: House Home Layout: One level Home Access: Stairs to enter Entrance Stairs-Rails: Right, Left Entrance Stairs-Number  of Steps: 3 Bathroom Shower/Tub: Chiropodist: Standard Home Care Services: No  Discharge Living Setting Plans for Discharge Living Setting: Patient's home, Lives with (comment)(spouse) Type of Home at Discharge: House Discharge Home Layout: One level Discharge Home Access: Stairs to enter Entrance Stairs-Rails: Right, Left Entrance Stairs-Number of Steps: 3 Discharge Bathroom Shower/Tub: Tub/shower unit Discharge Bathroom Toilet: Standard Discharge Bathroom Accessibility: Yes How Accessible: Accessible via walker Does the patient have any problems obtaining your medications?: No  Social/Family/Support Systems Patient Roles: Spouse, Parent Contact Information: spouse, Pilar Plate Anticipated Caregiver: spouse Anticipated Ambulance person Information: see above Caregiver Availability: 24/7 Discharge Plan Discussed with Primary Caregiver: Yes Is Caregiver In Agreement with Plan?:  Yes Does Caregiver/Family have Issues with Lodging/Transportation while Pt is in Rehab?: No  Goals/Additional Needs Patient/Family Goal for Rehab: supervision wiht PT, OT and SLP Expected length of stay: ELOS 14 to 17 days Pt/Family Agrees to Admission and willing to participate: Yes Program Orientation Provided & Reviewed with Pt/Caregiver Including Roles  & Responsibilities: Yes  Decrease burden of Care through IP rehab admission:  Possible need for SNF placement upon discharge:  Patient Condition: I have reviewed medical records from [facility] , spoken with CM, and patient and spouse. I met with patient at the bedside for inpatient rehabilitation assessment.  Patient will benefit from ongoing PT, OT and SLP, can actively participate in 3 hours of therapy a day 5 days of the week, and can make measurable gains during the admission. Patient's condition remains as documented in consult on 06/13/2019 by Dr. Letta Pate that patient is appropriate for an inpatient acute rehabilitation admission. Patient will also benefit from the coordinated team approach during an Inpatient Acute Rehabilitation admission.  The patient will receive intensive therapy as well as Rehabilitation physician, nursing, social worker, and care management interventions.  Due to bladder management, bowel management, safety, skin/wound care, disease management, medication administration, pain management and patient education the patient requires 24 hour a day rehabilitation nursing.  The patient is currently mod assist with mobility and basic ADLs.  Discharge setting and therapy post discharge at home with home health is anticipated.  Patient has agreed to participate in the Acute Inpatient Rehabilitation Program and will admit today.  Preadmission Screen Completed By:  Cleatrice Burke, RN, 06/14/2019 11:21 AM ______________________________________________________________________   Discussed status with Dr. Dagoberto Ligas on  06/14/2019 at 1121 and received approval for admission today.  Admission Coordinator:  Cleatrice Burke, time 7096 Date 06/14/2019

## 2019-06-13 NOTE — Progress Notes (Signed)
Occupational Therapy Treatment Patient Details Name: Tanya Knight MRN: [id number] DOB: 10-28-1940 Today's Date: 06/13/2019    History of present illness [name] is a 79 y.o. female with history of HTN and seizures presenting with sudden onset HA, nausea, vomiting, slurred speech and weakness resulting in a fall. MRI with intraparenchymal hemorrahge in left cerebellar hemisphere and cerebellar vermis.    OT comments  Upon arrival, pt reporting of headache 8/10 pain but agreeable to OT/PT session. Pt limited this session due to BP (see below). Pt donned socks while in bed and progressed to EOB with minguard. She required minA+2 for functional mobility at RW level. With return to recliner, she completed oral care after setup assistance from therapist. Functionally she appears to undershoot. She reports diplopia this session, reports diplopia disappears with left eye closed and objects split side to side. Limited visual assessment due to headache, will continue to assess vision. Pt will continue to benefit from skilled OT services to maximize safety and independence with ADL/IADL and functional mobility. Will continue to follow acutely and progress as tolerated.   Orthostatic Blood Pressure:  Sitting EOB: 159/85  HR 74bpm  Standing 1 min: 128/85  HR 77bpm  Standing 3 min: 125/84  HR 90bpm  Return to supine: 155/86  HR 71bpm   Follow Up Recommendations  CIR;Supervision/Assistance - 24 hour    Equipment Recommendations  Other (comment)(TBD)    Recommendations for Other Services      Precautions / Restrictions Precautions Precautions: Fall       Mobility Bed Mobility Overal bed mobility: Needs Assistance Bed Mobility: Supine to Sit     Supine to sit: Min guard;HOB elevated        Transfers Overall transfer level: Needs assistance Equipment used: Rolling walker (2 wheeled) Transfers: Sit to/from Stand Sit to Stand: Min assist;+2 safety/equipment          General transfer comment: minA+2 for safety, pt reports increased dizziness with standing;    Balance Overall balance assessment: Needs assistance Sitting-balance support: Feet supported Sitting balance-Leahy Scale: Fair     Standing balance support: Bilateral upper extremity supported Standing balance-Leahy Scale: Poor Standing balance comment: reliant on external support                           ADL either performed or assessed with clinical judgement   ADL Overall ADL's : Needs assistance/impaired     Grooming: Set up;Sitting;Oral care;Wash/dry face Grooming Details (indicate cue type and reason): completed sitting due to BP             Lower Body Dressing: Set up Lower Body Dressing Details (indicate cue type and reason): donned socks while sitting in bed Toilet Transfer: +2 for physical assistance;+2 for safety/equipment;RW;Ambulation;Minimal assistance Toilet Transfer Details (indicate cue type and reason): simulated Toileting- Clothing Manipulation and Hygiene: +2 for physical assistance;+2 for safety/equipment;Minimal assistance       Functional mobility during ADLs: +2 for physical assistance;+2 for safety/equipment;Rolling walker;Minimal assistance General ADL Comments: pt limited by orthostatic BP;decreased proprioception due to diplopia     Vision   Vision Assessment?: Vision impaired- to be further tested in functional context Additional Comments: limited this session due to pt reporting headache and preference for keeping eyes shut, reports diplopia with eyes open, images side by side, goes away with Left eye shut;will continue to assess   Perception     Praxis      Cognition Arousal/Alertness:  Awake/alert Behavior During Therapy: WFL for tasks assessed/performed Overall Cognitive Status: Impaired/Different from baseline Area of Impairment: Awareness;Safety/judgement                         Safety/Judgement: Decreased  awareness of deficits Awareness: Emergent   General Comments: pt following all commands, appears to have decreased insight into deficits        Exercises     Shoulder Instructions       General Comments orthostatic BP, RN aware    Pertinent Vitals/ Pain       Pain Assessment: 0-10 Pain Score: 8  Pain Location: headache Pain Descriptors / Indicators: Headache Pain Intervention(s): Limited activity within patient's tolerance;Monitored during session  Home Living                                          Prior Functioning/Environment              Frequency  Min 2X/week        Progress Toward Goals  OT Goals(current goals can now be found in the care plan section)  Progress towards OT goals: Progressing toward goals  Acute Rehab OT Goals Patient Stated Goal: to get back to baseline OT Goal Formulation: With patient Time For Goal Achievement: 06/26/19 Potential to Achieve Goals: Good ADL Goals Pt Will Perform Grooming: with supervision;sitting;standing Pt Will Perform Lower Body Bathing: with supervision;sitting/lateral leans;sit to/from stand Pt Will Perform Upper Body Dressing: with modified independence;sitting Pt Will Perform Lower Body Dressing: with supervision;sit to/from stand;sitting/lateral leans Pt Will Transfer to Toilet: with supervision;ambulating Pt Will Perform Toileting - Clothing Manipulation and hygiene: with supervision;sit to/from stand Additional ADL Goal #1: Pt will participate in further visual assessment PRN.  Plan Discharge plan remains appropriate    Co-evaluation    PT/OT/SLP Co-Evaluation/Treatment: Yes Reason for Co-Treatment: Complexity of the patient's impairments (multi-system involvement);For patient/therapist safety;To address functional/ADL transfers   OT goals addressed during session: ADL's and self-care      AM-PAC OT "6 Clicks" Daily Activity     Outcome Measure   Help from another person eating  meals?: A Little Help from another person taking care of personal grooming?: A Little Help from another person toileting, which includes using toliet, bedpan, or urinal?: A Little Help from another person bathing (including washing, rinsing, drying)?: A Little Help from another person to put on and taking off regular upper body clothing?: A Little Help from another person to put on and taking off regular lower body clothing?: A Little 6 Click Score: 18    End of Session Equipment Utilized During Treatment: Gait belt;Rolling walker  OT Visit Diagnosis: Muscle weakness (generalized) (M62.81);Unsteadiness on feet (R26.81);Pain Pain - part of body: (headache)   Activity Tolerance Treatment limited secondary to medical complications (Comment)(orthostatic BP)   Patient Left in chair;with call bell/phone within reach;with chair alarm set   Nurse Communication Mobility status(BP)        Time: [id number] OT Time Calculation (min): 28 min  Charges: OT General Charges $OT Visit: 1 Visit OT Treatments $Self Care/Home Management : 8-22 mins  Dorinda Hill OTR/L Acute Rehabilitation Services Office: [place] 06/13/2019, 5:12 PM

## 2019-06-13 NOTE — Progress Notes (Signed)
Pt states she has had a remarkable improvement in her headache since last evening. She states headache is only 6/10 currently, and "feel so much better this morning"

## 2019-06-13 NOTE — Progress Notes (Signed)
Physical Therapy Treatment Patient Details Name: Tanya Knight MRN: [id number] DOB: 1941-02-11 Today's Date: 06/13/2019    History of Present Illness Tanya Knight is a 79 y.o. female with history of HTN and seizures presenting with sudden onset HA, nausea, vomiting, slurred speech and weakness resulting in a fall. MRI with intraparenchymal hemorrahge in left cerebellar hemisphere and cerebellar vermis.     PT Comments    Pt orthostatic during session so mobility limited.  Pt continues to benefit from aggressive rehab to improve strength and function before returning home.    Follow Up Recommendations  CIR     Equipment Recommendations  Rolling walker with 5" wheels    Recommendations for Other Services       Precautions / Restrictions Precautions Precautions: Fall Restrictions Weight Bearing Restrictions: No    Mobility  Bed Mobility Overal bed mobility: Needs Assistance Bed Mobility: Supine to Sit     Supine to sit: Min guard;HOB elevated     General bed mobility comments: Reports dizziness upon sitting.  Transfers Overall transfer level: Needs assistance Equipment used: Rolling walker (2 wheeled) Transfers: Sit to/from Stand Sit to Stand: Min assist;+2 safety/equipment         General transfer comment: minA+2 for safety, pt reports increased dizziness with standing;  Cues for hand placement  Ambulation/Gait Ambulation/Gait assistance: Min assist;+2 physical assistance Gait Distance (Feet): 10 Feet Assistive device: Rolling walker (2 wheeled) Gait Pattern/deviations: Step-through pattern;Decreased stride length;Ataxic;Trunk flexed;Shuffle Gait velocity: decreased   General Gait Details: Deferred further gt training as patient was orthostatic and symptomatic.   Stairs             Wheelchair Mobility    Modified Rankin (Stroke Patients Only) Modified Rankin (Stroke Patients Only) Pre-Morbid Rankin Score: No symptoms Modified Rankin:  Moderately severe disability     Balance Overall balance assessment: Needs assistance Sitting-balance support: Feet supported Sitting balance-Leahy Scale: Fair Sitting balance - Comments: requiring min guard-minA for donning socks   Standing balance support: Bilateral upper extremity supported Standing balance-Leahy Scale: Poor Standing balance comment: reliant on external support                            Cognition Arousal/Alertness: Awake/alert Behavior During Therapy: WFL for tasks assessed/performed Overall Cognitive Status: Impaired/Different from baseline Area of Impairment: Awareness;Safety/judgement                         Safety/Judgement: Decreased awareness of deficits Awareness: Emergent   General Comments: pt following all commands, appears to have decreased insight into deficits      Exercises      General Comments General comments (skin integrity, edema, etc.): orthostatic BP, RN aware      Pertinent Vitals/Pain Pain Assessment: 0-10 Pain Score: 8  Faces Pain Scale: Hurts little more Pain Location: headache Pain Descriptors / Indicators: Headache Pain Intervention(s): Ice applied;Limited activity within patient's tolerance;Monitored during session    Home Living                      Prior Function            PT Goals (current goals can now be found in the care plan section) Acute Rehab PT Goals Patient Stated Goal: to get back to baseline Potential to Achieve Goals: Good Progress towards PT goals: Progressing toward goals    Frequency    Min 4X/week  PT Plan Current plan remains appropriate    Co-evaluation PT/OT/SLP Co-Evaluation/Treatment: Yes Reason for Co-Treatment: Complexity of the patient's impairments (multi-system involvement);For patient/therapist safety PT goals addressed during session: Mobility/safety with mobility OT goals addressed during session: ADL's and self-care      AM-PAC  PT "6 Clicks" Mobility   Outcome Measure  Help needed turning from your back to your side while in a flat bed without using bedrails?: None Help needed moving from lying on your back to sitting on the side of a flat bed without using bedrails?: A Little Help needed moving to and from a bed to a chair (including a wheelchair)?: A Little Help needed standing up from a chair using your arms (e.g., wheelchair or bedside chair)?: A Little Help needed to walk in hospital room?: A Lot Help needed climbing 3-5 steps with a railing? : A Lot 6 Click Score: 17    End of Session Equipment Utilized During Treatment: Gait belt Activity Tolerance: Patient tolerated treatment well Patient left: in chair;with call bell/phone within reach;with chair alarm set Nurse Communication: Mobility status PT Visit Diagnosis: Unsteadiness on feet (R26.81);Ataxic gait (R26.0);Difficulty in walking, not elsewhere classified (R26.2);Other symptoms and signs involving the nervous system (R29.898)     Time: [id number] PT Time Calculation (min) (ACUTE ONLY): 29 min  Charges:  $Gait Training: 8-22 mins                     Erasmo Leventhal , PTA Acute Rehabilitation Services Pager [phone] Office [phone]     Shamila Lerch Eli Hose 06/13/2019, 6:06 PM

## 2019-06-13 NOTE — Consult Note (Signed)
Physical Medicine and Rehabilitation Consult Reason for Consult: Decreased functional ability with slurred speech Referring Physician: Dr.Xu   HPI: Tanya Knight is a 79 y.o. right-handed female with history of hypertension, seizure disorder maintained on Depakote.  Per chart review patient lives with spouse.  Independent prior to admission.  1 level home 3 steps to entry.  Presented 06/10/2019 with headache, nausea/vomiting, weakness and slurred speech.  Cranial CT scan showed a 16 cc hematoma centered in the cerebellar vermis with mild subdural and subarachnoid extension.  No hydrocephalus.  Blood pressure [id number] with systolic increasing into the 160s and started on Cleviprex.  Patient had been on aspirin prior to admission and held due to hematoma.  Admission chemistry sodium 134, potassium 3.2, urine drug screen negative, WBC 11,900.  CT angiogram showed no posterior fossa aneurysm or AVM identified.  Follow-up CT/MRI shows no evidence of additional or worsened cerebellar hemorrhage.  Neurology follow-up close monitoring of blood pressure.  Subcutaneous heparin initiated for DVT prophylaxis 06/11/2019.  Patient remains on Depakote 500 mg every 12 hours as prior to admission.  Tolerating a regular diet.  Therapy evaluations completed with recommendations of physical medicine rehab consult.  Patient lives with her husband.  Son is at bedside. Review of Systems  Constitutional: Negative for chills and fever.  HENT: Negative for hearing loss.   Eyes: Negative for blurred vision and double vision.  Respiratory: Negative for cough and shortness of breath.   Cardiovascular: Negative for chest pain, palpitations and leg swelling.  Gastrointestinal: Positive for nausea and vomiting.       GERD  Genitourinary: Negative for dysuria, flank pain and hematuria.  Musculoskeletal: Positive for myalgias.  Skin: Negative for rash.  Neurological: Positive for dizziness, weakness and headaches.  All  other systems reviewed and are negative.  Past Medical History:  Diagnosis Date   Fracture of metatarsal    Repair of fractured R metatarsal --Dr Duda---4/08   GERD (gastroesophageal reflux disease)    Hypertension    Melanoma in situ ([place]) 7/17   Dr Kellie Moor   Osteoporosis    Seizure disorder ([place])    Seizures ([place])    Vitamin D deficiency    Past Surgical History:  Procedure Laterality Date   CATARACT EXTRACTION, BILATERAL Bilateral 2014   EYE SURGERY     Obstructed tear duct   FOOT SURGERY  2008   MELANOMA EXCISION Left 11/2015   left lower leg   TEAR DUCT PROBING  10/12   temporary stent   TONSILLECTOMY AND ADENOIDECTOMY  1948   Family History  Problem Relation Age of Onset   Hypertension Mother    Heart disease Father    Breast cancer Daughter 63   Diabetes Maternal Aunt    Coronary artery disease Paternal Aunt    Breast cancer Paternal Aunt    Coronary artery disease Paternal Uncle    Heart disease Maternal Grandmother    Heart disease Maternal Grandfather    Heart disease Paternal Grandmother    Heart disease Paternal Grandfather    Colon cancer Neg Hx    Stomach cancer Neg Hx    Pancreatic cancer Neg Hx    Rectal cancer Neg Hx    Social History:  reports that she has never smoked. She has never used smokeless tobacco. She reports current alcohol use. She reports that she does not use drugs. Allergies:  Allergies  Allergen Reactions   Phenytoin     Other reaction(s): Other (See Comments)  Other Reaction: Other reaction REACTION: severe reaction   Medications Prior to Admission  Medication Sig Dispense Refill   amLODipine (NORVASC) 2.5 MG tablet TAKE 1 TABLET(2.5 MG) BY MOUTH DAILY (Patient taking differently: Take 2.5 mg by mouth at bedtime. TAKE 1 TABLET(2.5 MG) BY MOUTH DAILY) 90 tablet 0   aspirin 81 MG tablet Take 81 mg by mouth daily.       cholecalciferol (VITAMIN D) 1000 units tablet Take 1,000 Units by mouth  daily.     divalproex (DEPAKOTE) 500 MG DR tablet TAKE 1 TABLET(500 MG) BY MOUTH TWICE DAILY (Patient taking differently: Take 500 mg by mouth See admin instructions. Pt takes 500 mg twice daily only on Monday,Wednesday & Friday. All other days are once daily) 180 tablet 3   losartan (COZAAR) 100 MG tablet TAKE 1 TABLET(100 MG) BY MOUTH DAILY (Patient taking differently: Take 100 mg by mouth daily. TAKE 1 TABLET(100 MG) BY MOUTH DAILY) 90 tablet 0   metoprolol succinate (TOPROL-XL) 50 MG 24 hr tablet TAKE 1 TABLET BY MOUTH DAILY WITH OR IMMEDIATELY FOLLOWING A MEAL (Patient taking differently: Take 50 mg by mouth daily. TAKE 1 TABLET BY MOUTH DAILY WITH OR IMMEDIATELY FOLLOWING A MEAL) 90 tablet 0   Multiple Vitamin (MULTIVITAMIN) capsule Take 1 capsule by mouth daily.       Multiple Vitamins-Minerals (OCUVITE PO) Take by mouth.        Home: Home Living Family/patient expects to be discharged to:: Private residence Living Arrangements: Spouse/significant other Available Help at Discharge: Family, Available 24 hours/day Type of Home: House Home Access: Stairs to enter [employer] of Steps: 3 Entrance Stairs-Rails: Right, Left Home Layout: One level Bathroom Shower/Tub: Chiropodist: Standard Home Equipment: Environmental consultant - 2 wheels, [place] - single point  Lives With: Spouse  Functional History: Prior Function Level of Independence: Independent Functional Status:  Mobility: Bed Mobility Overal bed mobility: Needs Assistance Bed Mobility: Supine to Sit Supine to sit: Min guard Transfers Overall transfer level: Needs assistance Equipment used: 1 person hand held assist Transfers: Sit to/from Stand, Stand Pivot Transfers Sit to Stand: Min assist, +2 safety/equipment Stand pivot transfers: Mod assist, +2 safety/equipment General transfer comment: MinA to rise from edge of bed, modA + 2 to pivot towards left onto [facility]. Noted decreased eccentric  control Ambulation/Gait Ambulation/Gait assistance: Mod assist, +2 physical assistance, +2 safety/equipment Gait Distance (Feet): 10 Feet Assistive device: 2 person hand held assist Gait Pattern/deviations: Step-through pattern, Decreased stride length, Ataxic, Trunk flexed, Shuffle General Gait Details: Requiring modA + 2 for dynamic stability; noted decreased truncal control, increased lateral sway. Cues provided for sequencing/direction and larger step lengths. Gait velocity: decreased Gait velocity interpretation: <1.31 ft/sec, indicative of household ambulator    ADL: ADL Overall ADL's : Needs assistance/impaired Eating/Feeding: Set up, Sitting Grooming: Set up, Min guard, Sitting Upper Body Bathing: Min guard, Sitting Lower Body Bathing: Moderate assistance, Sit to/from stand Upper Body Dressing : Min guard, Sitting Lower Body Dressing: Moderate assistance, Sit to/from stand Lower Body Dressing Details (indicate cue type and reason): pt was able to don socks seated EOB given increased time/effort Toilet Transfer: Moderate assistance, +2 for physical assistance, +2 for safety/equipment, Stand-pivot, BSC Toileting- Clothing Manipulation and Hygiene: Moderate assistance, +2 for physical assistance, +2 for safety/equipment, Sit to/from stand, Sitting/lateral lean Toileting - Clothing Manipulation Details (indicate cue type and reason): pt able to perform some pericare vial lateral leans, assist for gown management Functional mobility during ADLs: Moderate assistance, +2 for physical  assistance, +2 for safety/equipment(HHA) General ADL Comments: pt with weakness, pain, decreased activity tolerance and standing balance  Cognition: Cognition Overall Cognitive Status: Impaired/Different from baseline Arousal/Alertness: Lethargic Orientation Level: Oriented X4 Attention: Sustained Sustained Attention: Appears intact Memory: Impaired Memory Impairment: Decreased short term  memory Decreased Short Term Memory: Verbal basic Problem Solving: Appears intact Safety/Judgment: Appears intact Cognition Arousal/Alertness: Awake/alert Behavior During Therapy: WFL for tasks assessed/performed Overall Cognitive Status: Impaired/Different from baseline Area of Impairment: Awareness, Safety/judgement Orientation Level: Disoriented to, Situation Safety/Judgement: Decreased awareness of deficits Awareness: Emergent General Comments: Pleasant, follows all commands. Decreased insight into deficits/safety.  Blood pressure (!) 146/93, pulse 63, temperature 98.1 F (36.7 C), temperature source Oral, resp. rate 18, height 5\' 1"  (1.549 m), weight 55 kg, SpO2 97 %. Physical Exam  Vitals reviewed. Constitutional: She is oriented to person, place, and time. She appears well-developed and well-nourished.  HENT:  Head: Normocephalic and atraumatic.  Eyes: Pupils are equal, round, and reactive to light. Conjunctivae and EOM are normal.  Neck:  Patient feels dizzy and a little nauseated when she turns her head side to side  Cardiovascular: Normal rate, regular rhythm and normal heart sounds.  No murmur heard. Respiratory: Effort normal and breath sounds normal. No respiratory distress. She has no wheezes.  GI: Soft. Bowel sounds are normal. She exhibits no distension. There is no abdominal tenderness.  Musculoskeletal:        General: No deformity or edema.     Cervical back: Normal range of motion.  Neurological: She is alert and oriented to person, place, and time. Coordination and gait abnormal.  Patient is lethargic but arousable.  Makes eye contact with examiner.  Speech is a bit dysarthric but intelligible.  Follows commands.  Provides name and age.  Patient has minimal dysmetria bilateral finger-nose-finger Sitting balance is good.  She does get dizzy when she turns her head side to side. Her motor strength is 5/5 bilateral deltoid by stress grip hip flexion extension  ankle dorsiflexion  Skin: Skin is warm and dry.  Psychiatric: She has a normal mood and affect. Her behavior is normal. Judgment and thought content normal.    Results for orders placed or performed during the hospital encounter of 06/10/19 (from the past 24 hour(s))  CBC     Status: None   Collection Time: 06/13/19  2:18 AM  Result Value Ref Range   WBC 4.6 4.0 - 10.5 K/uL   RBC 4.40 3.87 - 5.11 MIL/uL   Hemoglobin 13.2 12.0 - 15.0 g/dL   HCT 39.1 36.0 - 46.0 %   MCV 88.9 80.0 - 100.0 fL   MCH 30.0 26.0 - 34.0 pg   MCHC 33.8 30.0 - 36.0 g/dL   RDW 13.4 11.5 - 15.5 %   Platelets 262 150 - 400 K/uL   nRBC 0.0 0.0 - 0.2 %  Basic metabolic panel     Status: Abnormal   Collection Time: 06/13/19  2:18 AM  Result Value Ref Range   Sodium 134 (L) 135 - 145 mmol/L   Potassium 4.6 3.5 - 5.1 mmol/L   Chloride 100 98 - 111 mmol/L   CO2 23 22 - 32 mmol/L   Glucose, Bld 157 (H) 70 - 99 mg/dL   BUN 18 8 - 23 mg/dL   Creatinine, Ser 0.75 0.44 - 1.00 mg/dL   Calcium 9.2 8.9 - 10.3 mg/dL   GFR calc non Af Amer >60 >60 mL/min   GFR calc Af Amer >60 >60 mL/min  Anion gap 11 5 - 15   CT HEAD WO CONTRAST  Result Date: 06/12/2019 CLINICAL DATA:  Right-sided weakness and slurred speech. Follow-up cerebellar hemorrhage. EXAM: CT HEAD WITHOUT CONTRAST TECHNIQUE: Contiguous axial images were obtained from the base of the skull through the vertex without intravenous contrast. COMPARISON:  MRI 06/11/2019.  CT studies 06/10/2019. FINDINGS: Brain: No evidence of additional bleeding. Intraparenchymal hemorrhage within the left cerebellar hemisphere and cerebellar vermis. Mild surrounding edema. Small amount of subarachnoid blood, now migrated invisible within some of the sulci over the cerebral hemispheres. Small amount of blood dependent in the occipital horns. Ventricular size is stable. Vascular: No primary vascular lesion evident. Skull: Negative Sinuses/Orbits: Clear/normal Other: None IMPRESSION: No  evidence of additional or worsened cerebellar hemorrhage. Small amount of subarachnoid and intraventricular blood as expected in this case. No new or complicating feature. No sign of obstructive hydrocephalus. Electronically Signed   By: Nelson Chimes M.D.   On: 06/12/2019 12:09   MR BRAIN WO CONTRAST  Result Date: 06/11/2019 CLINICAL DATA:  Cerebellar hemorrhage. EXAM: MRI HEAD WITHOUT CONTRAST TECHNIQUE: Multiplanar, multiecho pulse sequences of the brain and surrounding structures were obtained without intravenous contrast. COMPARISON:  CT studies done yesterday. FINDINGS: Brain: Redemonstrated is acute intraparenchymal hemorrhage in the left cerebellum and cerebellar vermis. Overall size of the hematoma is approximately 4 cm in diameter. Surrounding edema. Small intraparenchymal cystic space measuring about 8 mm in size in the left cerebellum, nature uncertain. Subarachnoid extension has occurs. Blood products are present within the cerebellar folia. Some intraventricular blood layering dependently in the occipital horns. Cerebral hemispheres show a background pattern of mild for age chronic small-vessel disease of the white matter. Lateral and third ventricles are slightly prominent. There is some potential for ventricular obstruction at the distal aqueduct and upper fourth ventricle. Vascular: Major vessels at the base of the brain show flow. Skull and upper cervical spine: Negative Sinuses/Orbits: Clear/normal Other: None IMPRESSION: MRI redemonstrates intraparenchymal hemorrhage in the left cerebellar hemisphere and cerebellar vermis measuring about 4 cm in diameter. There is surrounding edema. Subarachnoid penetration. 8 mm cystic area seen along the left lateral margin of the hematoma. Question if this could represent a pre-existing abnormality, not specifically characterized. Small amount of subarachnoid blood has migrated dependent within the occipital horns of the lateral ventricles. Prominence of  the lateral and third ventricles could possibly relate to compromise of the distal aqueduct/upper fourth ventricle. Mild chronic small-vessel ischemic change of the cerebral hemispheric white matter otherwise. Continued follow-up suggested, including postcontrast imaging to help look for an underlying brain lesion. Electronically Signed   By: Nelson Chimes M.D.   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 Does the need for close, 24 hr/day medical supervision in concert with the patient's rehab needs make it unreasonable for this patient to be served in a less intensive setting? Yes 2. 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 Due to bladder management, bowel management, safety, skin/wound care, disease management, medication administration, pain management and patient education, does the patient require 24 hr/day rehab nursing? Yes 4. Does the patient require coordinated care of a physician, rehab nurse, therapy disciplines of PT, OT, speech to address physical and functional deficits in the context of the above medical diagnosis(es)? Yes Addressing deficits in the following areas: balance, endurance, locomotion, strength, transferring, bowel/bladder control, bathing, dressing, feeding, toileting, speech and  psychosocial support 5. Can the patient actively participate in an intensive therapy program of at least 3 hrs of therapy per day at least 5 days per week? Yes 6. The potential for patient to make measurable gains while on inpatient rehab is excellent 7. Anticipated functional outcomes upon discharge from inpatient rehab are supervision  with PT, supervision with OT, supervision with SLP. 8. Estimated rehab length of stay to reach the above functional goals is: 14 to 17  days 9. Anticipated discharge destination: Home 10. Overall Rehab/Functional Prognosis: excellent  RECOMMENDATIONS: This patient's condition is appropriate for continued rehabilitative care in the following setting: CIR Patient has agreed to participate in recommended program. Yes Note that insurance prior authorization may be required for reimbursement for recommended care.  Comment:   "I have personally performed a face to face diagnostic evaluation of this patient.  Additionally, I have reviewed and concur with the physician assistant's documentation above."   Cathlyn Parsons, PA-C 06/13/2019   Charlett Blake M.D. [facility] FAAPM&R (Neuromuscular Med) Diplomate Am Board of Electrodiagnostic Med Fellow Am Board of Interventional Pain

## 2019-06-13 NOTE — Progress Notes (Signed)
STROKE TEAM PROGRESS NOTE   INTERVAL HISTORY Son at bedside. She states he HA is so much better.Patient interested in ongoing healthy diet to help BP control and stroke avoidance.  Ready for rehab when bed available.Patient will benefit from ongoing skilled PT services in [facility] to continue to advance safe functional mobility, address ongoing impairments in  Next few weeks, and minimize fall risk..   Vitals:   06/13/19 0020 06/13/19 0430 06/13/19 0730 06/13/19 0922  BP: (!) 146/75 (!) 146/93 (!) 140/96 118/75  Pulse: 70 63 (!) 59 60  Resp: 18 18 14    Temp: 97.6 F (36.4 C) 98.1 F (36.7 C) 98.1 F (36.7 C)   TempSrc: Oral Oral Oral   SpO2: 97% 97% 97%   Weight:      Height:        CBC:  Recent Labs  Lab 06/10/19 0519 06/10/19 0522 06/12/19 0220 06/13/19 0218  WBC 11.9*   < > 6.6 4.6  NEUTROABS 10.7*  --   --   --   HGB 14.4   < > 12.7 13.2  HCT 43.3   < > 38.3 39.1  MCV 89.8   < > 90.5 88.9  PLT 299   < > 244 262   < > = values in this interval not displayed.    Basic Metabolic Panel:  Recent Labs  Lab 06/12/19 0220 06/13/19 0218  NA 134* 134*  K 5.0 4.6  CL 101 100  CO2 24 23  GLUCOSE 109* 157*  BUN 17 18  CREATININE 0.65 0.75  CALCIUM 9.3 9.2   Lipid Panel:     Component Value Date/Time   CHOL 219 (H) 06/11/2019 0241   TRIG 73 06/11/2019 0241   HDL 84 06/11/2019 0241   CHOLHDL 2.6 06/11/2019 0241   VLDL 15 06/11/2019 0241   LDLCALC 120 (H) 06/11/2019 0241   HgbA1c:  Lab Results  Component Value Date   HGBA1C 5.1 06/11/2019   Urine Drug Screen:     Component Value Date/Time   LABOPIA NONE DETECTED 06/10/2019 0623   COCAINSCRNUR NONE DETECTED 06/10/2019 0623   LABBENZ NONE DETECTED 06/10/2019 0623   AMPHETMU NONE DETECTED 06/10/2019 0623   THCU NONE DETECTED 06/10/2019 0623   LABBARB NONE DETECTED 06/10/2019 0623    Alcohol Level     Component Value Date/Time   ETH <10 06/10/2019 0519    IMAGING past 48 hours CT HEAD WO CONTRAST  Result  Date: 06/12/2019 CLINICAL DATA:  Right-sided weakness and slurred speech. Follow-up cerebellar hemorrhage. EXAM: CT HEAD WITHOUT CONTRAST TECHNIQUE: Contiguous axial images were obtained from the base of the skull through the vertex without intravenous contrast. COMPARISON:  MRI 06/11/2019.  CT studies 06/10/2019. FINDINGS: Brain: No evidence of additional bleeding. Intraparenchymal hemorrhage within the left cerebellar hemisphere and cerebellar vermis. Mild surrounding edema. Small amount of subarachnoid blood, now migrated invisible within some of the sulci over the cerebral hemispheres. Small amount of blood dependent in the occipital horns. Ventricular size is stable. Vascular: No primary vascular lesion evident. Skull: Negative Sinuses/Orbits: Clear/normal Other: None IMPRESSION: No evidence of additional or worsened cerebellar hemorrhage. Small amount of subarachnoid and intraventricular blood as expected in this case. No new or complicating feature. No sign of obstructive hydrocephalus. Electronically Signed   By: Nelson Chimes M.D.   On: 06/12/2019 12:09   MR BRAIN WO CONTRAST  Result Date: 06/11/2019 CLINICAL DATA:  Cerebellar hemorrhage. EXAM: MRI HEAD WITHOUT CONTRAST TECHNIQUE: Multiplanar, multiecho pulse sequences of the  brain and surrounding structures were obtained without intravenous contrast. COMPARISON:  CT studies done yesterday. FINDINGS: Brain: Redemonstrated is acute intraparenchymal hemorrhage in the left cerebellum and cerebellar vermis. Overall size of the hematoma is approximately 4 cm in diameter. Surrounding edema. Small intraparenchymal cystic space measuring about 8 mm in size in the left cerebellum, nature uncertain. Subarachnoid extension has occurs. Blood products are present within the cerebellar folia. Some intraventricular blood layering dependently in the occipital horns. Cerebral hemispheres show a background pattern of mild for age chronic small-vessel disease of the white  matter. Lateral and third ventricles are slightly prominent. There is some potential for ventricular obstruction at the distal aqueduct and upper fourth ventricle. Vascular: Major vessels at the base of the brain show flow. Skull and upper cervical spine: Negative Sinuses/Orbits: Clear/normal Other: None IMPRESSION: MRI redemonstrates intraparenchymal hemorrhage in the left cerebellar hemisphere and cerebellar vermis measuring about 4 cm in diameter. There is surrounding edema. Subarachnoid penetration. 8 mm cystic area seen along the left lateral margin of the hematoma. Question if this could represent a pre-existing abnormality, not specifically characterized. Small amount of subarachnoid blood has migrated dependent within the occipital horns of the lateral ventricles. Prominence of the lateral and third ventricles could possibly relate to compromise of the distal aqueduct/upper fourth ventricle. Mild chronic small-vessel ischemic change of the cerebral hemispheric white matter otherwise. Continued follow-up suggested, including postcontrast imaging to help look for an underlying brain lesion. Electronically Signed   By: Nelson Chimes M.D.   On: 06/11/2019 11:51     PHYSICAL EXAM    Temp:  [97.3 F (36.3 C)-98.1 F (36.7 C)] 98.1 F (36.7 C) (02/01 0730) Pulse Rate:  [59-70] 60 (02/01 0922) Resp:  [14-18] 14 (02/01 0730) BP: (118-172)/(71-96) 118/75 (02/01 0922) SpO2:  [97 %-99 %] 97 % (02/01 0730)  General - Thin built, frail elderly Caucasian lady, in no apparent distress.  Ophthalmologic - fundi not visualized due to noncooperation.  Cardiovascular - Regular rhythm and rate.  Mental Status -  Level of arousal and orientation to time, place, and person were intact. Language including expression, naming, repetition, comprehension was assessed and found intact. Mild dysarthria Fund of Knowledge was assessed and was intact.  Cranial Nerves II - XII - II - Visual field intact OU. III, IV,  VI - Extraocular movements intact. V - Facial sensation intact bilaterally. VII - Facial movement intact bilaterally. VIII - Hearing intact bilaterally.   X - Palate elevates symmetrically.   XI - Chin turning & shoulder shrug intact bilaterally. XII - Tongue protrusion intact.  Motor Strength - The patient's strength was symmetrical in all extremities and pronator drift was absent.  Bulk was normal and fasciculations were absent.   Motor Tone - Muscle tone was assessed at the neck and appendages and was normal.  Reflexes - The patient's reflexes were symmetrical in all extremities and she had no pathological reflexes.  Sensory - Light touch, temperature/pinprick were assessed and were symmetrical.    Coordination - The patient has dysmetria on left FTN, no significant dysmetria on HTS bilaterally although slow action.  Tremor was absent.  Gait and Station - deferred.   ASSESSMENT/PLAN [name] is a 79 y.o. female with history of HTN and seizures presenting with sudden onset HA, nausea, vomiting, slurred speech and weakness resulting in a fall.   ICH:  Cerebellar vermis ICH with SDH and SAH, likely hypertensive  Code Stroke CT head 16cc hematoma cerebellar vermis w/ mild  SDH and SAH. 4th ventricle narrowing w/o hydrocephalus  CTA head no AVM or aneurysm seen. No LVO or stenosis. 4mm supraclinoid R ICA aneurysm.   MRI - stable hematoma, SDH, SAH, trace IVH, no hydrocephalus, no obvious CAA  CT head 1/31 - stable hematoma, no hydro  2D Echo - EF 55 to 60%. No cardiac source of emboli identified.   LDL - 120  HgbA1c - 5.1  UDS neg  Heparin subq for VTE prophylaxis  aspirin 81 mg daily prior to admission, now on No antithrombotic given hemorrhage   Therapy recommendations:  [facility]    Disposition:  pending (lives w/ husband, son supportive)  Medically ready for transfer to CIR when bed available  HA and LBP  Could be related to elevated BP  Increase  norvasc  CT no hydro and stable hematoma  On fioricet and tylenol PRN  Add tramadol PRN  HA much improved  Hypertension  Home meds:  losartan 100, amlodipine 2.5, metoprolol 50  Stable at 140s  On amlodipine 10 now  Continue metoprolol 50 . SBP goal < 160 . Labetalol PRN . Long-term BP goal normotensive  Hx of seizure  One time seizure in 1997  On depakote 500 bid - continue  Other Stroke Risk Factors  Advanced age  ETOH use, alcohol level <10, advised to drink no more than 1 drink(s) a day  Other Active Problems  Hypokalemia 3.2-3.3 - supplement->4.6->5.0->4.6 resolved  Hx melanoma insitu LLE  Code status - Full code  Mild Leukocytosis - 10.7 (afebrile)->6.6->4.6  resolved  Hospital day # 3 Continue ongoing therapies and strict blood pressure control.  Patient medically stable to be transferred to inpatient rehab after insurance approval and when bed available.  Discussed with patient and son and answered questions.  Greater than 50% time during this 25-minute visit was spent on counseling and coordination of care and discussion with patient family and care team and answering questions. Tanya Contras, MD  To contact Stroke Continuity provider, please refer to [email or web address] After hours, contact General Neurology

## 2019-06-14 ENCOUNTER — Inpatient Hospital Stay (HOSPITAL_COMMUNITY)
Admission: RE | Admit: 2019-06-14 | Discharge: 2019-07-13 | DRG: 057 | Disposition: A | Discharge status: Home Health | Payer: Medicare PPO | Source: Intra-hospital | Attending: Physical Medicine & Rehabilitation | Admitting: Physical Medicine & Rehabilitation

## 2019-06-14 ENCOUNTER — Encounter (HOSPITAL_COMMUNITY): Payer: Self-pay | Admitting: Physical Medicine & Rehabilitation

## 2019-06-14 DIAGNOSIS — R1310 Dysphagia, unspecified: Secondary | ICD-10-CM | POA: Diagnosis present

## 2019-06-14 DIAGNOSIS — R569 Unspecified convulsions: Secondary | ICD-10-CM | POA: Diagnosis not present

## 2019-06-14 DIAGNOSIS — I610 Nontraumatic intracerebral hemorrhage in hemisphere, subcortical: Secondary | ICD-10-CM | POA: Diagnosis not present

## 2019-06-14 DIAGNOSIS — Z7982 Long term (current) use of aspirin: Secondary | ICD-10-CM | POA: Diagnosis not present

## 2019-06-14 DIAGNOSIS — G4700 Insomnia, unspecified: Secondary | ICD-10-CM | POA: Diagnosis present

## 2019-06-14 DIAGNOSIS — Z833 Family history of diabetes mellitus: Secondary | ICD-10-CM

## 2019-06-14 DIAGNOSIS — E876 Hypokalemia: Secondary | ICD-10-CM | POA: Diagnosis not present

## 2019-06-14 DIAGNOSIS — R5383 Other fatigue: Secondary | ICD-10-CM

## 2019-06-14 DIAGNOSIS — I609 Nontraumatic subarachnoid hemorrhage, unspecified: Secondary | ICD-10-CM | POA: Diagnosis not present

## 2019-06-14 DIAGNOSIS — E222 Syndrome of inappropriate secretion of antidiuretic hormone: Secondary | ICD-10-CM | POA: Diagnosis not present

## 2019-06-14 DIAGNOSIS — Z8249 Family history of ischemic heart disease and other diseases of the circulatory system: Secondary | ICD-10-CM | POA: Diagnosis not present

## 2019-06-14 DIAGNOSIS — G40909 Epilepsy, unspecified, not intractable, without status epilepticus: Secondary | ICD-10-CM | POA: Diagnosis not present

## 2019-06-14 DIAGNOSIS — I69391 Dysphagia following cerebral infarction: Secondary | ICD-10-CM | POA: Diagnosis not present

## 2019-06-14 DIAGNOSIS — I69293 Ataxia following other nontraumatic intracranial hemorrhage: Principal | ICD-10-CM

## 2019-06-14 DIAGNOSIS — E559 Vitamin D deficiency, unspecified: Secondary | ICD-10-CM | POA: Diagnosis not present

## 2019-06-14 DIAGNOSIS — G441 Vascular headache, not elsewhere classified: Secondary | ICD-10-CM | POA: Diagnosis present

## 2019-06-14 DIAGNOSIS — I10 Essential (primary) hypertension: Secondary | ICD-10-CM | POA: Diagnosis not present

## 2019-06-14 DIAGNOSIS — H532 Diplopia: Secondary | ICD-10-CM | POA: Diagnosis present

## 2019-06-14 DIAGNOSIS — R32 Unspecified urinary incontinence: Secondary | ICD-10-CM | POA: Diagnosis present

## 2019-06-14 DIAGNOSIS — K219 Gastro-esophageal reflux disease without esophagitis: Secondary | ICD-10-CM | POA: Diagnosis present

## 2019-06-14 DIAGNOSIS — R799 Abnormal finding of blood chemistry, unspecified: Secondary | ICD-10-CM | POA: Diagnosis not present

## 2019-06-14 DIAGNOSIS — I619 Nontraumatic intracerebral hemorrhage, unspecified: Secondary | ICD-10-CM | POA: Diagnosis present

## 2019-06-14 DIAGNOSIS — Z85820 Personal history of malignant melanoma of skin: Secondary | ICD-10-CM | POA: Diagnosis not present

## 2019-06-14 DIAGNOSIS — I639 Cerebral infarction, unspecified: Secondary | ICD-10-CM | POA: Diagnosis not present

## 2019-06-14 DIAGNOSIS — M810 Age-related osteoporosis without current pathological fracture: Secondary | ICD-10-CM | POA: Diagnosis not present

## 2019-06-14 DIAGNOSIS — Z86006 Personal history of melanoma in-situ: Secondary | ICD-10-CM | POA: Diagnosis not present

## 2019-06-14 DIAGNOSIS — I614 Nontraumatic intracerebral hemorrhage in cerebellum: Secondary | ICD-10-CM | POA: Diagnosis not present

## 2019-06-14 DIAGNOSIS — E785 Hyperlipidemia, unspecified: Secondary | ICD-10-CM

## 2019-06-14 DIAGNOSIS — E871 Hypo-osmolality and hyponatremia: Secondary | ICD-10-CM | POA: Diagnosis not present

## 2019-06-14 DIAGNOSIS — Z79899 Other long term (current) drug therapy: Secondary | ICD-10-CM | POA: Diagnosis not present

## 2019-06-14 LAB — CBC
HCT: 40.2 % (ref 36.0–46.0)
Hemoglobin: 13.6 g/dL (ref 12.0–15.0)
MCH: 30.6 pg (ref 26.0–34.0)
MCHC: 33.8 g/dL (ref 30.0–36.0)
MCV: 90.5 fL (ref 80.0–100.0)
Platelets: 298 10*3/uL (ref 150–400)
RBC: 4.44 MIL/uL (ref 3.87–5.11)
RDW: 13.9 % (ref 11.5–15.5)
WBC: 9.8 10*3/uL (ref 4.0–10.5)
nRBC: 0 % (ref 0.0–0.2)

## 2019-06-14 LAB — BASIC METABOLIC PANEL
Anion gap: 10 (ref 5–15)
BUN: 23 mg/dL (ref 8–23)
CO2: 24 mmol/L (ref 22–32)
Calcium: 9 mg/dL (ref 8.9–10.3)
Chloride: 99 mmol/L (ref 98–111)
Creatinine, Ser: 0.64 mg/dL (ref 0.44–1.00)
GFR calc Af Amer: >60 mL/min (ref >60)
GFR calc non Af Amer: >60 mL/min (ref >60)
Glucose, Bld: 102 mg/dL — ABNORMAL HIGH (ref 70–99)
Potassium: 3.7 mmol/L (ref 3.5–5.1)
Sodium: 133 mmol/L — ABNORMAL LOW (ref 135–145)

## 2019-06-14 MED ORDER — DIVALPROEX SODIUM 500 MG PO DR TAB: 500.0000 mg | DELAYED_RELEASE_TABLET | Freq: Two times a day (BID) | ORAL | Status: DC

## 2019-06-14 MED ORDER — ACETAMINOPHEN 325 MG PO TABS
650.0000 mg | ORAL_TABLET | ORAL | Status: DC | PRN
  Administered 2019-06-15 – 2019-07-13 (×57): 650 mg via ORAL
  Filled 2019-06-14 (×58): qty 2

## 2019-06-14 MED ORDER — SENNOSIDES-DOCUSATE SODIUM 8.6-50 MG PO TABS
1.0000 | ORAL_TABLET | Freq: Two times a day (BID) | ORAL | Status: DC
  Administered 2019-06-14 – 2019-07-13 (×58): 1 via ORAL
  Filled 2019-06-14 (×58): qty 1

## 2019-06-14 MED ORDER — ACETAMINOPHEN 160 MG/5ML PO SOLN
650.0000 mg | ORAL | Status: DC | PRN
  Administered 2019-06-18: 10:00:00 650 mg
  Filled 2019-06-14: qty 20.3

## 2019-06-14 MED ORDER — SORBITOL 70 % SOLN: 30.0000 mL | Freq: Every day | Status: DC | PRN

## 2019-06-14 MED ORDER — AMLODIPINE BESYLATE 10 MG PO TABS
10.0000 mg | ORAL_TABLET | Freq: Every day | ORAL | Status: DC
  Administered 2019-06-14 – 2019-07-13 (×30): 10 mg via ORAL
  Filled 2019-06-14 (×31): qty 1

## 2019-06-14 MED ORDER — METOPROLOL SUCCINATE ER 50 MG PO TB24: 50.0000 mg | ORAL_TABLET | Freq: Every day | ORAL | Status: DC

## 2019-06-14 MED ORDER — ONDANSETRON HCL 4 MG/2ML IJ SOLN: 2.0000 mg | Freq: Four times a day (QID) | INTRAMUSCULAR | 0 refills | Status: DC | PRN

## 2019-06-14 MED ORDER — CHLORHEXIDINE GLUCONATE CLOTH 2 % EX PADS: 6.0000 | MEDICATED_PAD | Freq: Every day | CUTANEOUS | Status: DC

## 2019-06-14 MED ORDER — TRAMADOL HCL 50 MG PO TABS: 50.0000 mg | ORAL_TABLET | Freq: Four times a day (QID) | ORAL | Status: DC | PRN

## 2019-06-14 MED ORDER — PANTOPRAZOLE SODIUM 40 MG PO TBEC
40.0000 mg | DELAYED_RELEASE_TABLET | Freq: Every day | ORAL | Status: DC
  Administered 2019-06-14 – 2019-06-21 (×8): 40 mg via ORAL
  Filled 2019-06-14 (×8): qty 1

## 2019-06-14 MED ORDER — SENNOSIDES-DOCUSATE SODIUM 8.6-50 MG PO TABS: 1.0000 | ORAL_TABLET | Freq: Two times a day (BID) | ORAL | Status: DC

## 2019-06-14 MED ORDER — ONDANSETRON HCL 4 MG/2ML IJ SOLN
2.0000 mg | Freq: Four times a day (QID) | INTRAMUSCULAR | Status: DC | PRN
  Administered 2019-06-14: 2 mg via INTRAVENOUS
  Filled 2019-06-14: qty 2

## 2019-06-14 MED ORDER — DIVALPROEX SODIUM 500 MG PO DR TAB
500.0000 mg | DELAYED_RELEASE_TABLET | Freq: Two times a day (BID) | ORAL | Status: DC
  Administered 2019-06-14 – 2019-07-13 (×58): 500 mg via ORAL
  Filled 2019-06-14 (×58): qty 1

## 2019-06-14 MED ORDER — BUTALBITAL-APAP-CAFFEINE 50-325-40 MG PO TABS: 1.0000 | ORAL_TABLET | Freq: Three times a day (TID) | ORAL | 0 refills | Status: DC | PRN

## 2019-06-14 MED ORDER — PANTOPRAZOLE SODIUM 40 MG PO TBEC: 40.0000 mg | DELAYED_RELEASE_TABLET | Freq: Every day | ORAL | Status: DC

## 2019-06-14 MED ORDER — HEPARIN SODIUM (PORCINE) 5000 UNIT/ML IJ SOLN: 5000.0000 [IU] | Freq: Two times a day (BID) | INTRAMUSCULAR | Status: DC

## 2019-06-14 MED ORDER — ACETAMINOPHEN 650 MG RE SUPP: 650.0000 mg | RECTAL | Status: DC | PRN

## 2019-06-14 MED ORDER — BUTALBITAL-APAP-CAFFEINE 50-325-40 MG PO TABS: 1.0000 | ORAL_TABLET | Freq: Three times a day (TID) | ORAL | Status: DC | PRN

## 2019-06-14 MED ORDER — AMLODIPINE BESYLATE 10 MG PO TABS: 10.0000 mg | ORAL_TABLET | Freq: Every day | ORAL | Status: DC

## 2019-06-14 MED ORDER — TRAMADOL HCL 50 MG PO TABS
50.0000 mg | ORAL_TABLET | Freq: Four times a day (QID) | ORAL | Status: DC | PRN
  Administered 2019-06-14 – 2019-06-15 (×2): 50 mg via ORAL
  Filled 2019-06-14 (×2): qty 1

## 2019-06-14 MED ORDER — METOPROLOL SUCCINATE ER 50 MG PO TB24
50.0000 mg | ORAL_TABLET | Freq: Every day | ORAL | Status: DC
  Administered 2019-06-14 – 2019-07-13 (×30): 50 mg via ORAL
  Filled 2019-06-14 (×30): qty 1

## 2019-06-14 MED ORDER — HEPARIN SODIUM (PORCINE) 5000 UNIT/ML IJ SOLN
5000.0000 [IU] | Freq: Two times a day (BID) | INTRAMUSCULAR | Status: DC
  Administered 2019-06-14 – 2019-07-13 (×58): 5000 [IU] via SUBCUTANEOUS
  Filled 2019-06-14 (×58): qty 1

## 2019-06-14 MED ORDER — LABETALOL HCL 5 MG/ML IV SOLN: 10.0000 mg | INTRAVENOUS | Status: DC | PRN

## 2019-06-14 NOTE — Progress Notes (Signed)
Pt arrived to unit via bed from 3W. Pt was oriented to unit. Plan of care and medications reviewed. Pt's son was at bedside. Pt and son were oriented to unit. No further questions or concerns at this time. Amanda Cockayne, LPN

## 2019-06-14 NOTE — Progress Notes (Signed)
Physical Therapy Treatment Patient Details Name: Tanya Knight MRN: [id number] DOB: [birth year] Today's Date: 06/14/2019    History of Present Illness [name] is a 79 y.o. female with history of HTN and seizures presenting with sudden onset HA, nausea, vomiting, slurred speech and weakness resulting in a fall. MRI with intraparenchymal hemorrahge in left cerebellar hemisphere and cerebellar vermis.     PT Comments    Pt reported headache and dizziness in supine. She required max assist of 2 for supine to sit today, which is significantly more assist than yesterday. In sitting she reported increased dizziness and nausea. BP supine 158/82, sitting 154/84.  Dizziness did not resolve after sitting for ~5 minutes. +2 total assist for sit to supine. Decreased activity tolerance today.   Follow Up Recommendations  [facility]     Equipment Recommendations  Rolling walker with 5" wheels    Recommendations for Other Services       Precautions / Restrictions Precautions Precautions: Fall Restrictions Weight Bearing Restrictions: No    Mobility  Bed Mobility Overal bed mobility: Needs Assistance Bed Mobility: Supine to Sit;Sit to Supine     Supine to sit: HOB elevated;Max assist;+2 for physical assistance Sit to supine: Total assist;+2 for physical assistance   General bed mobility comments: +2 max assist to raise trunk and pivot hips to edge of bed, pt limited by dizziness/nausea, +2 total for returning to supine, BP 158/82 supine, 154/84 sitting, pt reported increased dizziness in sitting and that it was worse today than yesterday  Transfers                 General transfer comment: NT -pt dizzy/nauseous in sitting  Ambulation/Gait                 Stairs             Wheelchair Mobility    Modified Rankin (Stroke Patients Only) Modified Rankin (Stroke Patients Only) Pre-Morbid Rankin Score: No symptoms Modified Rankin: Moderately severe  disability     Balance Overall balance assessment: Needs assistance Sitting-balance support: Feet supported;Single extremity supported Sitting balance-Leahy Scale: Poor Sitting balance - Comments: posterior lean initially requiring min A then fair       Standing balance comment: reliant on external support                            Cognition Arousal/Alertness: Awake/alert Behavior During Therapy: WFL for tasks assessed/performed Overall Cognitive Status: Impaired/Different from baseline Area of Impairment: Awareness;Safety/judgement                         Safety/Judgement: Decreased awareness of deficits Awareness: Emergent   General Comments: pt following all commands, appears to have decreased insight into deficits      Exercises      General Comments        Pertinent Vitals/Pain Pain Assessment: 0-10 Pain Score: 9  Faces Pain Scale: Hurts even more Pain Location: headache Pain Descriptors / Indicators: Aching Pain Intervention(s): Monitored during session;Other (comment)(told RN)    Home Living                      Prior Function            PT Goals (current goals can now be found in the care plan section) Acute Rehab PT Goals Patient Stated Goal: likes to go [place] walks [place] the  [place] PT Goal Formulation: With patient Time For Goal Achievement: 06/25/19 Potential to Achieve Goals: Good Progress towards PT goals: Not progressing toward goals - comment(limited by dizziness/nausea)    Frequency    Min 4X/week      PT Plan Current plan remains appropriate    Co-evaluation              AM-PAC PT "6 Clicks" Mobility   Outcome Measure  Help needed turning from your back to your side while in a flat bed without using bedrails?: A Little Help needed moving from lying on your back to sitting on the side of a flat bed without using bedrails?: A Lot Help needed moving to and from a bed to a chair (including a  wheelchair)?: Total Help needed standing up from a chair using your arms (e.g., wheelchair or bedside chair)?: Total Help needed to walk in hospital room?: Total Help needed climbing 3-5 steps with a railing? : Total 6 Click Score: 9    End of Session Equipment Utilized During Treatment: Gait belt Activity Tolerance: Treatment limited secondary to medical complications (Comment)(dizziness/nausea) Patient left: with call bell/phone within reach;in bed;with bed alarm set Nurse Communication: Mobility status;Other (comment)(pt nauseous) PT Visit Diagnosis: Unsteadiness on feet (R26.81);Ataxic gait (R26.0);Difficulty in walking, not elsewhere classified (R26.2);Other symptoms and signs involving the nervous system (R29.898)     Time: [id number] PT Time Calculation (min) (ACUTE ONLY): 20 min  Charges:  $Therapeutic Activity: 8-22 mins                    Blondell Reveal Kistler PT 06/14/2019  Acute Rehabilitation Services Pager [phone] Office [phone]

## 2019-06-14 NOTE — Progress Notes (Signed)
[name], RN  Rehab Admission Coordinator  Physical Medicine and Rehabilitation  PMR Pre-admission  Signed  Date of Service:  06/13/2019  2:48 PM      Related encounter: ED to Hosp-Admission (Current) from 06/10/2019 in [facility]      Signed        Show:Clear all _0 Manual_1 Template_2 Copied  Added by: _3 [name], RN  _4 Hover for details PMR Admission Coordinator Pre-Admission Assessment   Patient: Tanya Knight is an 79 y.o., female MRN: [id number] DOB: May 15, 1940 Height: _5  (154.9 cm) Weight: 55 kg                                                                                                                                                  Insurance Information HMO:     PPO: yes     PCP:      IPA:      80/20:      OTHER:  PRIMARY: [employer]      Policy#: [id number]      Subscriber: pt CM Name: Shirlean Mylar      Phone#: [phone] ext [phone]     Fax#: [phone] Pre-Cert#: [id number] approved for 7 days with f/u with Jeanette Caprice ext [phone] same fax     Employer:  Benefits:  Phone #: [phone]     Name: 2/2 Eff. Date: 05/13/2019     Deduct: none      Out of Pocket Max: $4000      Life Max: none CIR: $160 co pay per day days 1 until 10      SNF: no co pay days 1 until 20; $50 co pay per day days 21 until 100 Outpatient: $20 per visit     Co-Pay: visits per medical neccesity Home Health: 100%      Co-Pay: visits per medical neccesity DME: 80%     Co-Pay: 20% Providers: in network  SECONDARY: none         Medicaid Application Date:       Case Manager:  Disability Application Date:       Case Worker:    The "Data Collection Information Summary" for patients in Inpatient Rehabilitation Facilities with attached "Privacy Act [place] Records" was provided and verbally reviewed with: Patient and Family   Emergency Contact Information         Contact Information     Name Relation Home Work [address]    Samadhi, Mahurin Son      [phone]    Jailani, Hogans [phone]           Current Medical History  Patient Admitting Diagnosis: ICH   History of Present Illness: 79 y.o. right-handed female with history of hypertension, seizure disorder maintained on Depakote.    Independent prior to admission.   Presented 06/10/2019 with headache, nausea/vomiting, weakness and  slurred speech.  Cranial CT scan showed a 16 cc hematoma centered in the cerebellar vermis with mild subdural and subarachnoid extension.  No hydrocephalus.  Blood pressure 676/720 with systolic increasing into the 160's and started on Cleviprex.  Patient had been on aspirin prior to admission and held due to hematoma.  Admission chemistry sodium 134, potassium 3.2, urine drug screen negative, WBC 11,900.  CT angiogram showed no posterior fossa aneurysm or AVM identified.  Follow-up CT/MRI shows no evidence of additional or worsened cerebellar hemorrhage.  Neurology follow-up close monitoring of blood pressure.  Subcutaneous heparin initiated for DVT prophylaxis 06/11/2019.  Patient remains on Depakote 500 mg every 12 hours as prior to admission.  Tolerating a regular diet.    Complete NIHSS TOTAL: 1 Glasgow Coma Scale Score: 15   Past Medical History      Past Medical History:  Diagnosis Date  . Fracture of metatarsal      Repair of fractured R metatarsal --Dr Duda---4/08  . GERD (gastroesophageal reflux disease)    . Hypertension    . Melanoma in situ [facility]) 7/17    Dr Kellie Moor  . Osteoporosis    . Seizure disorder ([place])    . Seizures ([place])    . Vitamin D deficiency        Family History  family history includes Breast cancer in her paternal aunt; Breast cancer (age of onset: 71) in her daughter; Coronary artery disease in her paternal aunt and paternal uncle; Diabetes in her maternal aunt; Heart disease in her father, maternal grandfather, maternal grandmother, paternal grandfather, and paternal grandmother; Hypertension in her mother.     Prior Rehab/Hospitalizations:  Has the patient had prior rehab or hospitalizations prior to admission? Yes   Has the patient had major surgery during 100 days prior to admission? No   Current Medications    Current Facility-Administered Medications:  .   stroke: mapping our early stages of recovery book, , Does not apply, Once, Aroor, Karena Addison R, MD .  acetaminophen (TYLENOL) tablet 650 mg, 650 mg, Oral, Q4H PRN, 650 mg at 06/12/19 0644 **OR** acetaminophen (TYLENOL) 160 MG/5ML solution 650 mg, 650 mg, Per Tube, Q4H PRN **OR** acetaminophen (TYLENOL) suppository 650 mg, 650 mg, Rectal, Q4H PRN, Aroor, Karena Addison R, MD .  amLODipine (NORVASC) tablet 10 mg, 10 mg, Oral, Daily, Rosalin Hawking, MD, 10 mg at 06/14/19 1039 .  butalbital-acetaminophen-caffeine (FIORICET) 50-325-40 MG per tablet 1 tablet, 1 tablet, Oral, Q8H PRN, Rosalin Hawking, MD, 1 tablet at 06/13/19 1608 .  Chlorhexidine Gluconate Cloth 2 % PADS 6 each, 6 each, Topical, Daily, Rosalin Hawking, MD, 6 each at 06/14/19 1038 .  divalproex (DEPAKOTE) DR tablet 500 mg, 500 mg, Oral, Q12H, Aroor, Karena Addison R, MD, 500 mg at 06/14/19 1039 .  heparin injection 5,000 Units, 5,000 Units, Subcutaneous, Q12H, Rosalin Hawking, MD, 5,000 Units at 06/14/19 1039 .  labetalol (NORMODYNE) injection 10-20 mg, 10-20 mg, Intravenous, Q10 min PRN, Aroor, Karena Addison R, MD .  metoprolol succinate (TOPROL-XL) 24 hr tablet 50 mg, 50 mg, Oral, Daily, Aroor, Karena Addison R, MD, 50 mg at 06/13/19 1608 .  ondansetron (ZOFRAN) injection 2 mg, 2 mg, Intravenous, Q6H PRN, Biby, Sharon L, NP, 2 mg at 06/14/19 1043 .  pantoprazole (PROTONIX) EC tablet 40 mg, 40 mg, Oral, Daily, Rosalin Hawking, MD, 40 mg at 06/14/19 1039 .  senna-docusate (Senokot-S) tablet 1 tablet, 1 tablet, Oral, BID, Aroor, Lanice Schwab, MD, 1 tablet at 06/14/19 1039 .  traMADol (ULTRAM) tablet 50 mg,  50 mg, Oral, Q6H PRN, Rosalin Hawking, MD, 50 mg at 06/13/19 1802   Patients Current Diet:     Diet Order                       Diet regular Room service appropriate? Yes with Assist; Fluid consistency: Thin  Diet effective now                   Precautions / Restrictions Precautions Precautions: Fall Restrictions Weight Bearing Restrictions: No    Has the patient had 2 or more falls or a fall with injury in the past year?No   Prior Activity Level Community (5-7x/wk): Independent   Prior Functional Level Prior Function Level of Independence: Independent   Self Care: Did the patient need help bathing, dressing, using the toilet or eating?  Independent   Indoor Mobility: Did the patient need assistance with walking from room to room (with or without device)? Independent   Stairs: Did the patient need assistance with internal or external stairs (with or without device)? Independent   Functional Cognition: Did the patient need help planning regular tasks such as shopping or remembering to take medications? Independent   Home Assistive Devices / Equipment Home Assistive Devices/Equipment: None Home Equipment: Walker - 2 wheels, Cane - single point   Prior Device Use: Indicate devices/aids used by the patient prior to current illness, exacerbation or injury? None of the above   Current Functional Level Cognition   Arousal/Alertness: Lethargic Overall Cognitive Status: Impaired/Different from baseline Orientation Level: Oriented X4 Safety/Judgement: Decreased awareness of deficits General Comments: pt following all commands, appears to have decreased insight into deficits Attention: Sustained Sustained Attention: Appears intact Memory: Impaired Memory Impairment: Decreased short term memory Decreased Short Term Memory: Verbal basic Problem Solving: Appears intact Safety/Judgment: Appears intact    Extremity Assessment (includes Sensation/Coordination)   Upper Extremity Assessment: Generalized weakness  Lower Extremity Assessment: Defer to PT evaluation     ADLs   Overall ADL's : Needs  assistance/impaired Eating/Feeding: Set up, Sitting Grooming: Set up, Sitting, Oral care, Wash/dry face Grooming Details (indicate cue type and reason): completed sitting due to BP Upper Body Bathing: Min guard, Sitting Lower Body Bathing: Moderate assistance, Sit to/from stand Upper Body Dressing : Min guard, Sitting Lower Body Dressing: Set up Lower Body Dressing Details (indicate cue type and reason): donned socks while sitting in bed Toilet Transfer: +2 for physical assistance, +2 for safety/equipment, RW, Ambulation, Minimal assistance Toilet Transfer Details (indicate cue type and reason): simulated Toileting- Clothing Manipulation and Hygiene: +2 for physical assistance, +2 for safety/equipment, Minimal assistance Toileting - Clothing Manipulation Details (indicate cue type and reason): pt able to perform some pericare vial lateral leans, assist for gown management Functional mobility during ADLs: +2 for physical assistance, +2 for safety/equipment, Rolling walker, Minimal assistance General ADL Comments: pt limited by orthostatic BP;decreased proprioception due to diplopia     Mobility   Overal bed mobility: Needs Assistance Bed Mobility: Supine to Sit, Sit to Supine Supine to sit: HOB elevated, Max assist, +2 for physical assistance Sit to supine: Total assist, +2 for physical assistance General bed mobility comments: +2 max assist to raise trunk and pivot hips to edge of bed, pt limited by dizziness/nausea, +2 total for returning to supine, BP 158/82 supine, 154/84 sitting, pt reported increased dizziness in sitting and that it was worse today than yesterday     Transfers   Overall transfer level: Needs assistance Equipment used:  Rolling walker (2 wheeled) Transfers: Sit to/from Stand Sit to Stand: Min assist, +2 safety/equipment Stand pivot transfers: Mod assist, +2 safety/equipment General transfer comment: NT -pt dizzy/nauseous in sitting     Ambulation / Gait / Stairs /  Wheelchair Mobility   Ambulation/Gait Ambulation/Gait assistance: Min assist, +2 physical assistance Gait Distance (Feet): 10 Feet Assistive device: Rolling walker (2 wheeled) Gait Pattern/deviations: Step-through pattern, Decreased stride length, Ataxic, Trunk flexed, Shuffle General Gait Details: Deferred further gt training as patient was orthostatic and symptomatic. Gait velocity: decreased Gait velocity interpretation: <1.31 ft/sec, indicative of household ambulator     Posture / Balance Dynamic Sitting Balance Sitting balance - Comments: posterior lean initially requiring min A then fair Balance Overall balance assessment: Needs assistance Sitting-balance support: Feet supported, Single extremity supported Sitting balance-Leahy Scale: Poor Sitting balance - Comments: posterior lean initially requiring min A then fair Standing balance support: Bilateral upper extremity supported Standing balance-Leahy Scale: Poor Standing balance comment: reliant on external support     Special needs/care consideration BiPAP/CPAP CPM Continuous Drip IV Dialysis         Life Vest Oxygen Special Bed Trach Size Wound Vac  Skin                             Bowel mgmt:continent Bladder mgmt: external catheter Diabetic mgmt Behavioral consideration  Chemo/radiation  Visitor is Aram Beecham, son     Previous Home Environment  Living Arrangements: Spouse/significant other  Lives With: Spouse Available Help at Discharge: Family, Available 24 hours/day Type of Home: House Home Layout: One level Home Access: Stairs to enter Entrance Stairs-Rails: Right, Left Entrance Stairs-Number of Steps: 3 Bathroom Shower/Tub: Chiropodist: Standard Home Care Services: No   Discharge Living Setting Plans for Discharge Living Setting: Patient's home, Lives with (comment)(spouse) Type of Home at Discharge: House Discharge Home Layout: One level Discharge Home Access: Stairs to  enter Entrance Stairs-Rails: Right, Left Entrance Stairs-Number of Steps: 3 Discharge Bathroom Shower/Tub: Tub/shower unit Discharge Bathroom Toilet: Standard Discharge Bathroom Accessibility: Yes How Accessible: Accessible via walker Does the patient have any problems obtaining your medications?: No   Social/Family/Support Systems Patient Roles: Spouse, Parent Contact Information: spouse, Pilar Plate Anticipated Caregiver: spouse Anticipated Ambulance person Information: see above Caregiver Availability: 24/7 Discharge Plan Discussed with Primary Caregiver: Yes Is Caregiver In Agreement with Plan?: Yes Does Caregiver/Family have Issues with Lodging/Transportation while Pt is in Rehab?: No   Goals/Additional Needs Patient/Family Goal for Rehab: supervision wiht PT, OT and SLP Expected length of stay: ELOS 14 to 17 days Pt/Family Agrees to Admission and willing to participate: Yes Program Orientation Provided & Reviewed with Pt/Caregiver Including Roles  & Responsibilities: Yes   Decrease burden of Care through IP rehab admission:  Possible need for SNF placement upon discharge:   Patient Condition: I have reviewed medical records from [facility] , spoken with CM, and patient and spouse. I met with patient at the bedside for inpatient rehabilitation assessment.  Patient will benefit from ongoing PT, OT and SLP, can actively participate in 3 hours of therapy a day 5 days of the week, and can make measurable gains during the admission. Patient's condition remains as documented in consult on 06/13/2019 by Dr. Letta Pate that patient is appropriate for an inpatient acute rehabilitation admission. Patient will also benefit from the coordinated team approach during an Inpatient Acute Rehabilitation admission.  The patient will receive intensive therapy as well as Rehabilitation  physician, nursing, social worker, and care management interventions.  Due to bladder management, bowel management,  safety, skin/wound care, disease management, medication administration, pain management and patient education the patient requires 24 hour a day rehabilitation nursing.  The patient is currently mod assist with mobility and basic ADLs.  Discharge setting and therapy post discharge at home with home health is anticipated.  Patient has agreed to participate in the Acute Inpatient Rehabilitation Program and will admit today.   Preadmission Screen Completed By:  Cleatrice Burke, RN, 06/14/2019 11:21 AM ______________________________________________________________________   Discussed status with Dr. Dagoberto Ligas on 06/14/2019 at 1121 and received approval for admission today.   Admission Coordinator:  Cleatrice Burke, time 8022 Date 06/14/2019         Cosigned by: Courtney Heys, MD at 06/14/2019 12:04 PM  Revision History

## 2019-06-14 NOTE — H&P (Signed)
untitled image        Physical Medicine and Rehabilitation Admission H&P            Chief Complaint    Patient presents with    .   Code Stroke    :  HPI: Tanya Knight is a 79 year old right-handed female with history of hypertension, seizure disorder maintained on Depakote.  Per chart review patient lives with spouse.  Independent prior to admission.  1 level home with 3 steps to entry.  Presented 06/10/2019 with headache, nausea/vomiting, weakness and slurred speech.  Cranial CT scan showed a 16 cc hematoma centered in the cerebellar vermis with mild subdural and subarachnoid extension.  No hydrocephalus.  Blood pressure [id number] with systolic increasing into the 160s and started on Cleviprex.  Patient had been on aspirin prior to admission and held due to hematoma.  Admission chemistry sodium 134, potassium 3.2, urine drug screen negative, WBC 11,900.  CT angiogram showed no posterior fossa aneurysm or AVM identified.  Follow-up CT/MRI showed no evidence of additional  cerebellar hemorrhage.  Echocardiogram with ejection fraction of 60% without emboli.  Neurology follow-up close monitoring of blood pressure.  Subcutaneous heparin for DVT prophylaxis initiated 06/11/2019.  Patient remained on Depakote his prior to admission.  Tolerating a regular diet.  Therapy evaluations completed and patient was admitted for a comprehensive rehab program.     Pt c/o severe headache and also c/o nausea (ate <25% of lunch as well)- made worse by the bright light in the room/sunlight- asked for lights to be turned off and window blinds to be closed- said headache was 9/10- doesn't remember when her last BM was- not even sure if it was since came to hospital.  Cannot find any sign of BM since admission on 1/29. SO LBM was 3+ days ago.     When asked How are you, she responded with "I don't know".              Review of Systems   Constitutional: Negative for chills and fever.    HENT: Negative for hearing loss.    Eyes: Positive for double vision and photophobia. Negative for blurred vision.   Respiratory: Negative for cough and shortness of breath.    Cardiovascular: Negative for chest pain, palpitations and leg swelling.   Gastrointestinal: Positive for nausea and vomiting.        GERD   Genitourinary: Negative for dysuria, flank pain and hematuria.   Musculoskeletal: Positive for myalgias.   Skin: Negative for rash.   Neurological: Positive for dizziness, seizures, weakness and headaches.   All other systems reviewed and are negative.          Past Medical History:    Diagnosis   Date    .   Fracture of metatarsal            Repair of fractured R metatarsal --Dr Duda---4/08    .   GERD (gastroesophageal reflux disease)        .   Hypertension        .   Melanoma in situ [facility])   7/17        Dr Kellie Moor    .   Osteoporosis        .   Seizure disorder ([place])        .   Seizures ([place])        .   Vitamin D deficiency  Past Surgical History:    Procedure   Laterality   Date    .   CATARACT EXTRACTION, BILATERAL   Bilateral   2014    .   EYE SURGERY                Obstructed tear duct    .   FOOT SURGERY       2008    .   MELANOMA EXCISION   Left   11/2015        left lower leg    .   TEAR DUCT PROBING       10/12        temporary stent    .   TONSILLECTOMY AND ADENOIDECTOMY       1948             Family History    Problem   Relation   Age of Onset    .   Hypertension   Mother        .   Heart disease   Father        .   Breast cancer   Daughter   38    .   Diabetes   Maternal Aunt        .   Coronary artery disease   Paternal Aunt        .   Breast cancer   Paternal Aunt        .   Coronary artery disease   Paternal Uncle         .   Heart disease   Maternal Grandmother        .   Heart disease   Maternal Grandfather        .   Heart disease   Paternal Grandmother        .   Heart disease   Paternal Grandfather        .   Colon cancer   Neg Hx        .   Stomach cancer   Neg Hx        .   Pancreatic cancer   Neg Hx        .   Rectal cancer   Neg Hx           Social History:  reports that she has never smoked. She has never used smokeless tobacco. She reports current alcohol use. She reports that she does not use drugs.  Allergies:         Allergies    Allergen   Reactions    .   Phenytoin                Other reaction(s): Other (See Comments)  Other Reaction: Other reaction  REACTION: severe reaction              Medications Prior to Admission    Medication   Sig   Dispense   Refill    .   amLODipine (NORVASC) 2.5 MG tablet   TAKE 1 TABLET(2.5 MG) BY MOUTH DAILY (Patient taking differently: Take 2.5 mg by mouth at bedtime. TAKE 1 TABLET(2.5 MG) BY MOUTH DAILY)   90 tablet   0    .   aspirin 81 MG tablet   Take 81 mg by mouth daily.              .   cholecalciferol (VITAMIN D) 1000 units tablet  Take 1,000 Units by mouth daily.            .   divalproex (DEPAKOTE) 500 MG DR tablet   TAKE 1 TABLET(500 MG) BY MOUTH TWICE DAILY (Patient taking differently: Take 500 mg by mouth See admin instructions. Pt takes 500 mg twice daily only on Monday,Wednesday & Friday. All other days are once daily)   180 tablet   3    .   metoprolol succinate (TOPROL-XL) 50 MG 24 hr tablet   TAKE 1 TABLET BY MOUTH DAILY WITH OR IMMEDIATELY FOLLOWING A MEAL (Patient taking differently: Take 50 mg by mouth daily. TAKE 1 TABLET BY MOUTH DAILY WITH OR IMMEDIATELY FOLLOWING A MEAL)   90 tablet   0    .   Multiple Vitamin (MULTIVITAMIN) capsule   Take 1 capsule by mouth daily.               .   Multiple Vitamins-Minerals (OCUVITE PO)   Take by mouth.                    Drug Regimen Review  Drug regimen was reviewed and remains appropriate with no significant issues identified     Home:  Home Living  Family/patient expects to be discharged to:: Private residence  Living Arrangements: Spouse/significant other  Available Help at Discharge: Family, Available 24 hours/day  Type of Home: House  Home Access: Stairs to enter  [employer] of Steps: 3  Entrance Stairs-Rails: Right, Left  Home Layout: One level  Bathroom Shower/Tub: Administrator, Civil Service: Standard  Home Equipment: Environmental consultant - 2 wheels, [place] - single point   Lives With: Spouse     Functional History:  Prior Function  Level of Independence: Independent     Functional Status:   Mobility:  Bed Mobility  Overal bed mobility: Needs Assistance  Bed Mobility: Supine to Sit  Supine to sit: Min guard, HOB elevated  General bed mobility comments: Reports dizziness upon sitting.  Transfers  Overall transfer level: Needs assistance  Equipment used: Rolling walker (2 wheeled)  Transfers: Sit to/from Stand  Sit to Stand: Min assist, +2 safety/equipment  Stand pivot transfers: Mod assist, +2 safety/equipment  General transfer comment: minA+2 for safety, pt reports increased dizziness with standing;  Cues for hand placement  Ambulation/Gait  Ambulation/Gait assistance: Min assist, +2 physical assistance  Gait Distance (Feet): 10 Feet  Assistive device: Rolling walker (2 wheeled)  Gait Pattern/deviations: Step-through pattern, Decreased stride length, Ataxic, Trunk flexed, Shuffle  General Gait Details: Deferred further gt training as patient was orthostatic and symptomatic.  Gait velocity: decreased  Gait velocity interpretation: <1.31 ft/sec, indicative of household ambulator       ADL:  ADL  Overall ADL's : Needs  assistance/impaired  Eating/Feeding: Set up, Sitting  Grooming: Set up, Sitting, Oral care, Wash/dry face  Grooming Details (indicate cue type and reason): completed sitting due to BP  Upper Body Bathing: Min guard, Sitting  Lower Body Bathing: Moderate assistance, Sit to/from stand  Upper Body Dressing : Min guard, Sitting  Lower Body Dressing: Set up  Lower Body Dressing Details (indicate cue type and reason): donned socks while sitting in bed  Toilet Transfer: +2 for physical assistance, +2 for safety/equipment, RW, Ambulation, Minimal assistance  Toilet Transfer Details (indicate cue type and reason): simulated  Toileting- Clothing Manipulation and Hygiene: +2 for physical assistance, +2 for safety/equipment, Minimal assistance  Toileting - Clothing Manipulation Details (indicate cue type and reason):  pt able to perform some pericare vial lateral leans, assist for gown management  Functional mobility during ADLs: +2 for physical assistance, +2 for safety/equipment, Rolling walker, Minimal assistance  General ADL Comments: pt limited by orthostatic BP;decreased proprioception due to diplopia     Cognition:  Cognition  Overall Cognitive Status: Impaired/Different from baseline  Arousal/Alertness: Lethargic  Orientation Level: Oriented X4  Attention: Sustained  Sustained Attention: Appears intact  Memory: Impaired  Memory Impairment: Decreased short term memory  Decreased Short Term Memory: Verbal basic  Problem Solving: Appears intact  Safety/Judgment: Appears intact  Cognition  Arousal/Alertness: Awake/alert  Behavior During Therapy: WFL for tasks assessed/performed  Overall Cognitive Status: Impaired/Different from baseline  Area of Impairment: Awareness, Safety/judgement  Orientation Level: Disoriented to, Situation  Safety/Judgement: Decreased awareness of deficits  Awareness: Emergent  General Comments: pt following all commands, appears  to have decreased insight into deficits     Physical Exam:  Blood pressure 140/60, pulse 60, temperature 98.3 F (36.8 C), temperature source Oral, resp. rate 18, height 5\' 1"  (1.549 m), weight 55 kg, SpO2 98 %.  Physical Exam   Nursing note and vitals reviewed.  Constitutional:  Frail thin appearing female- who actually is burrowed into covers, bruises along L jawline, L upper lip and B/L wrists/dorsum of hands, mumbling, not always making sense and speech was slowed and slightly slurred.     HENT:   Head: Normocephalic and atraumatic.  Right Ear: External ear normal.  Left Ear: External ear normal.   Nose: Nose normal.  No facial droop- Sensation intact to light touch on both sides of face; tongue midline    Eyes: Right eye exhibits no discharge. Left eye exhibits no discharge. No scleral icterus.  R eye couldn't cross midline to look left and L did appears grossly to do the same- had horizontal nystagmus with R eye- c/o diplopia and kept closing R eye "to be more comfortable".    Neck: No tracheal deviation present.   Cardiovascular:  RRR, no M/R/G   Respiratory:  CTA B/L- no W/R/R   GI:  Soft, NT, slightly distended; hypoactive BS  Genitourinary:    Genitourinary Comments: Had purewick- small amount of medium amber urine in container    Musculoskeletal:     Cervical back: Normal range of motion and neck supple.     Comments: Couldn't completely comply with exam- tried to, but kept falling asleep Appears to be at least 4-/5 in UEs B/L and 4-/5 in LEs Tested throughout the deltoids, biceps, triceps, grip and finger abduction Also HF, KE, DF, and PF   Neurological:  Patient is alert sitting up in bed.  Makes eye contact with examiner.  Speech is dysarthric but intelligible.  Follows simple commands.  Provides name and age.  Limited overall awareness of deficits.  Attempted to follow commands, but slow to process, slow to respond, and very lethargic- fell asleep  in middle of doing some simple commands No hoffman's no Clonus in UEs and LEs B/L Sensation intact to light touch in all 4 extremities         Lab Results Last 48 Hours           Results for orders placed or performed during the hospital encounter of 06/10/19 (from the past 48 hour(s))    CBC     Status: None        Collection Time: 06/13/19  2:18 AM    Result   Value   Ref Range  WBC   4.6   4.0 - 10.5 K/uL        RBC   4.40   3.87 - 5.11 MIL/uL        Hemoglobin   13.2   12.0 - 15.0 g/dL        HCT   39.1   36.0 - 46.0 %        MCV   88.9   80.0 - 100.0 fL        MCH   30.0   26.0 - 34.0 pg        MCHC   33.8   30.0 - 36.0 g/dL        RDW   13.4   11.5 - 15.5 %        Platelets   262   150 - 400 K/uL        nRBC   0.0   0.0 - 0.2 %            Comment:   Performed at [facility], [place] [address], [place], [place] [id number]    Basic metabolic panel     Status: Abnormal        Collection Time: 06/13/19  2:18 AM    Result   Value   Ref Range        Sodium   134 (L)   135 - 145 mmol/L        Potassium   4.6   3.5 - 5.1 mmol/L        Chloride   100   98 - 111 mmol/L        CO2   23   22 - 32 mmol/L        Glucose, Bld   157 (H)   70 - 99 mg/dL        BUN   18   8 - 23 mg/dL        Creatinine, Ser   0.75   0.44 - 1.00 mg/dL        Calcium   9.2   8.9 - 10.3 mg/dL        GFR calc non Af Amer   >60   >60 mL/min        GFR calc Af Amer   >60   >60 mL/min        Anion gap   11   5 - 15            Comment:   Performed at [facility] [address] [place] [address]    CBC     Status: None        Collection Time: 06/14/19  4:29 AM    Result   Value   Ref Range        WBC   9.8   4.0 - 10.5 K/uL        RBC   4.44   3.87  - 5.11 MIL/uL        Hemoglobin   13.6   12.0 - 15.0 g/dL        HCT   40.2   36.0 - 46.0 %        MCV   90.5   80.0 - 100.0 fL        MCH   30.6   26.0 - 34.0 pg        MCHC   33.8   30.0 - 36.0  g/dL        RDW   13.9   11.5 - 15.5 %        Platelets   298   150 - 400 K/uL        nRBC   0.0   0.0 - 0.2 %            Comment:   Performed at [facility], [place] [address] [place], [place] [id number]    Basic metabolic panel     Status: Abnormal        Collection Time: 06/14/19  4:29 AM    Result   Value   Ref Range        Sodium   133 (L)   135 - 145 mmol/L        Potassium   3.7   3.5 - 5.1 mmol/L        Chloride   99   98 - 111 mmol/L        CO2   24   22 - 32 mmol/L        Glucose, Bld   102 (H)   70 - 99 mg/dL        BUN   23   8 - 23 mg/dL        Creatinine, Ser   0.64   0.44 - 1.00 mg/dL        Calcium   9.0   8.9 - 10.3 mg/dL        GFR calc non Af Amer   >60   >60 mL/min        GFR calc Af Amer   >60   >60 mL/min        Anion gap   10   5 - 15            Comment:   Performed at [facility], [place] [address], [place], [place] [address]           Imaging Results (Last 48 hours)                             Medical Problem List and Plan:  1.  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               -patient may  shower              -ELOS/Goals: ~ 2.5 to 3 weeks/ Goals CGA  2.  Antithrombotics:  -DVT/anticoagulation: Subcutaneous heparin initiated 06/11/2019              -antiplatelet therapy: N/A  3. Pain Management: Tramadol as needed, Fioricet as needed for headaches  4. Mood: Provide emotional support               -antipsychotic agents: N/A  5. Neuropsych: This patient is NOT capable of making decisions on her own behalf.  6. Skin/Wound Care: Routine skin checks  7. Fluids/Electrolytes/Nutrition: Routine in and outs with follow-up chemistries  8.  Hypertension.  Norvasc 10 mg daily, Toprol-XL 50 mg daily.  Monitor with increased mobility- monitor closely.   9.  History of seizure disorder.  Continue Depakote 500 mg every 12 hours  For old seizure d/o as well as prevention of new seizures  10. Diplopia- suggest eye patch intermittently to  help with double vision  11. Hypokalemia- last K+ 3.2- was repleted- will monitor  12. Severe daily headaches due to [place]- could add Topamax vs increasing Depakote for HA control-   13. Nausea/+/- vomiting- suggest zofran since less sedating- although can cause constipation- try prn, or scheduled before breakfast to help with nausea.        Lavon Paganini Angiulli, PA-C  06/14/2019      I have personally performed a face to face diagnostic evaluation of this patient and formulated the key components of the plan.  Additionally, I have personally reviewed laboratory data, imaging studies, as well as relevant notes and concur with the physician assistant's documentation above.     The patient's status has not changed from the original H&P.  Any changes in documentation from the acute care chart have been noted above.                     Revision History                                     Routing History

## 2019-06-14 NOTE — Progress Notes (Signed)
Inpatient Rehabilitation Admissions Coordinator  I met with patient at bedside and also spoke with her [name], by phone. We discussed goals and expectations of CIR as well as cost. I have insurance approval and they are in agreement to admit. I contacted Dr. Leonie Man, RN [place] , Vida Roller and SW. Kathlee Nations. I will make the arrangements to admit today.  Danne Baxter, RN, MSN Rehab Admissions Coordinator [phone] 06/14/2019 11:19 AM

## 2019-06-14 NOTE — Plan of Care (Signed)
  Problem: Consults Goal: RH STROKE PATIENT EDUCATION Description: See Patient Education module for education specifics  Outcome: Progressing   Problem: RH BOWEL ELIMINATION Goal: RH STG MANAGE BOWEL WITH ASSISTANCE Description: STG Manage Bowel with min Assistance. Outcome: Progressing Goal: RH STG MANAGE BOWEL W/MEDICATION W/ASSISTANCE Description: STG Manage Bowel with Medication with min Assistance. Outcome: Progressing   Problem: RH BLADDER ELIMINATION Goal: RH STG MANAGE BLADDER WITH ASSISTANCE Description: STG Manage Bladder With min Assistance Outcome: Progressing   Problem: RH SKIN INTEGRITY Goal: RH STG SKIN FREE OF INFECTION/BREAKDOWN Description: Patients skin will remain free from further infection or breakdown with min assist. Outcome: Progressing Goal: RH STG MAINTAIN SKIN INTEGRITY WITH ASSISTANCE Description: STG Maintain Skin Integrity With min Assistance. Outcome: Progressing Goal: RH STG ABLE TO PERFORM INCISION/WOUND CARE W/ASSISTANCE Description: STG Able To Perform Incision/Wound Care With min/mod Assistance. Outcome: Progressing   Problem: RH SAFETY Goal: RH STG ADHERE TO SAFETY PRECAUTIONS W/ASSISTANCE/DEVICE Description: STG Adhere to Safety Precautions With min Assistance/Device. Outcome: Progressing   Problem: RH KNOWLEDGE DEFICIT Goal: RH STG INCREASE KNOWLEDGE OF HYPERTENSION Description: Patient/caregiver will verbalize understanding of management of HTN including diet, exercise, medications, monitoring, and follow up care with min assist. Outcome: Progressing Goal: RH STG INCREASE KNOWLEGDE OF HYPERLIPIDEMIA Description: Patient/caregiver will verbalize understanding of management of HLD including diet, exercise, medications, monitoring, and follow up care with min assist. Outcome: Progressing Goal: RH STG INCREASE KNOWLEDGE OF STROKE PROPHYLAXIS Description: Patient/caregiver will verbalize understanding of management of stroke prophylaxis  including diet, exercise, medications, monitoring, and follow up care with min assist. Outcome: Progressing

## 2019-06-14 NOTE — TOC Transition Note (Signed)
Transition of Care [facility]) - CM/SW Discharge Note   Patient Details  Name: Tanya Knight MRN: [id number] Date of Birth: 1940-12-07  Transition of Care [facility]) CM/SW Contact:  Pollie Friar, RN Phone Number: 06/14/2019, 1:21 PM   Clinical Narrative:    Pt discharging to CIR. No further needs per CM.   Final next level of care: IP Rehab Facility Barriers to Discharge: No Barriers Identified   Patient Goals and CMS Choice        Discharge Placement                       Discharge Plan and Services                                     Social Determinants of Health (SDOH) Interventions     Readmission Risk Interventions No flowsheet data found.

## 2019-06-14 NOTE — Progress Notes (Signed)
Tanya Knight, Tanya Salk, MD  Physician  Physical Medicine and Rehabilitation  Consult Note  Signed  Date of Service:  06/13/2019  6:06 AM      Related encounter: ED to Hosp-Admission (Current) from 06/10/2019 in [facility]      Signed      Expand AllCollapse All   Show:Clear all [x] Manual[x] Template[] Copied  Added by: [x] Angiulli, Lavon Paganini, PA-C[x] Tanya Knight, Tanya Salk, MD  [] Hover for details          Physical Medicine and Rehabilitation Consult Reason for Consult: Decreased functional ability with slurred speech Referring Physician: Dr.Xu     HPI: Tanya Knight is a 79 y.o. right-handed female with history of hypertension, seizure disorder maintained on Depakote.  Per chart review patient lives with spouse.  Independent prior to admission.  1 level home 3 steps to entry.  Presented 06/10/2019 with headache, nausea/vomiting, weakness and slurred speech.  Cranial CT scan showed a 16 cc hematoma centered in the cerebellar vermis with mild subdural and subarachnoid extension.  No hydrocephalus.  Blood pressure [id number] with systolic increasing into the 160s and started on Cleviprex.  Patient had been on aspirin prior to admission and held due to hematoma.  Admission chemistry sodium 134, potassium 3.2, urine drug screen negative, WBC 11,900.  CT angiogram showed no posterior fossa aneurysm or AVM identified.  Follow-up CT/MRI shows no evidence of additional or worsened cerebellar hemorrhage.  Neurology follow-up close monitoring of blood pressure.  Subcutaneous heparin initiated for DVT prophylaxis 06/11/2019.  Patient remains on Depakote 500 mg every 12 hours as prior to admission.  Tolerating a regular diet.  Therapy evaluations completed with recommendations of physical medicine rehab consult.   Patient lives with her husband.  Son is at bedside. Review of Systems  Constitutional: Negative for chills and fever.  HENT: Negative for hearing loss.   Eyes: Negative for  blurred vision and double vision.  Respiratory: Negative for cough and shortness of breath.   Cardiovascular: Negative for chest pain, palpitations and leg swelling.  Gastrointestinal: Positive for nausea and vomiting.       GERD  Genitourinary: Negative for dysuria, flank pain and hematuria.  Musculoskeletal: Positive for myalgias.  Skin: Negative for rash.  Neurological: Positive for dizziness, weakness and headaches.  All other systems reviewed and are negative.       Past Medical History:  Diagnosis Date  . Fracture of metatarsal      Repair of fractured R metatarsal --Dr Duda---4/08  . GERD (gastroesophageal reflux disease)    . Hypertension    . Melanoma in situ [facility]) 7/17    Dr Kellie Moor  . Osteoporosis    . Seizure disorder ([place])    . Seizures ([place])    . Vitamin D deficiency           Past Surgical History:  Procedure Laterality Date  . CATARACT EXTRACTION, BILATERAL Bilateral 2014  . EYE SURGERY        Obstructed tear duct  . FOOT SURGERY   2008  . MELANOMA EXCISION Left 11/2015    left lower leg  . TEAR DUCT PROBING   10/12    temporary stent  . TONSILLECTOMY AND ADENOIDECTOMY   1948         Family History  Problem Relation Age of Onset  . Hypertension Mother    . Knight disease Father    . Breast cancer Daughter 26  . Diabetes Maternal Aunt    . Coronary artery disease  Paternal Aunt    . Breast cancer Paternal Aunt    . Coronary artery disease Paternal Uncle    . Knight disease Maternal Grandmother    . Knight disease Maternal Grandfather    . Knight disease Paternal Grandmother    . Knight disease Paternal Grandfather    . Colon cancer Neg Hx    . Stomach cancer Neg Hx    . Pancreatic cancer Neg Hx    . Rectal cancer Neg Hx      Social History:  reports that she has never smoked. She has never used smokeless tobacco. She reports current alcohol use. She reports that she does not use drugs. Allergies:       Allergies  Allergen Reactions  . Phenytoin         Other reaction(s): Other (See Comments) Other Reaction: Other reaction REACTION: severe reaction          Medications Prior to Admission  Medication Sig Dispense Refill  . amLODipine (NORVASC) 2.5 MG tablet TAKE 1 TABLET(2.5 MG) BY MOUTH DAILY (Patient taking differently: Take 2.5 mg by mouth at bedtime. TAKE 1 TABLET(2.5 MG) BY MOUTH DAILY) 90 tablet 0  . aspirin 81 MG tablet Take 81 mg by mouth daily.        . cholecalciferol (VITAMIN D) 1000 units tablet Take 1,000 Units by mouth daily.      . divalproex (DEPAKOTE) 500 MG DR tablet TAKE 1 TABLET(500 MG) BY MOUTH TWICE DAILY (Patient taking differently: Take 500 mg by mouth See admin instructions. Pt takes 500 mg twice daily only on Monday,Wednesday & Friday. All other days are once daily) 180 tablet 3  . losartan (COZAAR) 100 MG tablet TAKE 1 TABLET(100 MG) BY MOUTH DAILY (Patient taking differently: Take 100 mg by mouth daily. TAKE 1 TABLET(100 MG) BY MOUTH DAILY) 90 tablet 0  . metoprolol succinate (TOPROL-XL) 50 MG 24 hr tablet TAKE 1 TABLET BY MOUTH DAILY WITH OR IMMEDIATELY FOLLOWING A MEAL (Patient taking differently: Take 50 mg by mouth daily. TAKE 1 TABLET BY MOUTH DAILY WITH OR IMMEDIATELY FOLLOWING A MEAL) 90 tablet 0  . Multiple Vitamin (MULTIVITAMIN) capsule Take 1 capsule by mouth daily.        . Multiple Vitamins-Minerals (OCUVITE PO) Take by mouth.            Home: Home Living Family/patient expects to be discharged to:: Private residence Living Arrangements: Spouse/significant other Available Help at Discharge: Family, Available 24 hours/day Type of Home: House Home Access: Stairs to enter [employer] of Steps: 3 Entrance Stairs-Rails: Right, Left Home Layout: One level Bathroom Shower/Tub: Chiropodist: Standard Home Equipment: Environmental consultant - 2 wheels, [place] - single point  Lives With: Spouse  Functional History: Prior Function Level of Independence: Independent Functional Status:    Mobility: Bed Mobility Overal bed mobility: Needs Assistance Bed Mobility: Supine to Sit Supine to sit: Min guard Transfers Overall transfer level: Needs assistance Equipment used: 1 person hand held assist Transfers: Sit to/from Stand, Stand Pivot Transfers Sit to Stand: Min assist, +2 safety/equipment Stand pivot transfers: Mod assist, +2 safety/equipment General transfer comment: MinA to rise from edge of bed, modA + 2 to pivot towards left onto [facility]. Noted decreased eccentric control Ambulation/Gait Ambulation/Gait assistance: Mod assist, +2 physical assistance, +2 safety/equipment Gait Distance (Feet): 10 Feet Assistive device: 2 person hand held assist Gait Pattern/deviations: Step-through pattern, Decreased stride length, Ataxic, Trunk flexed, Shuffle General Gait Details: Requiring modA + 2 for dynamic stability;  noted decreased truncal control, increased lateral sway. Cues provided for sequencing/direction and larger step lengths. Gait velocity: decreased Gait velocity interpretation: <1.31 ft/sec, indicative of household ambulator   ADL: ADL Overall ADL's : Needs assistance/impaired Eating/Feeding: Set up, Sitting Grooming: Set up, Min guard, Sitting Upper Body Bathing: Min guard, Sitting Lower Body Bathing: Moderate assistance, Sit to/from stand Upper Body Dressing : Min guard, Sitting Lower Body Dressing: Moderate assistance, Sit to/from stand Lower Body Dressing Details (indicate cue type and reason): pt was able to don socks seated EOB given increased time/effort Toilet Transfer: Moderate assistance, +2 for physical assistance, +2 for safety/equipment, Stand-pivot, BSC Toileting- Clothing Manipulation and Hygiene: Moderate assistance, +2 for physical assistance, +2 for safety/equipment, Sit to/from stand, Sitting/lateral lean Toileting - Clothing Manipulation Details (indicate cue type and reason): pt able to perform some pericare vial lateral leans, assist for gown  management Functional mobility during ADLs: Moderate assistance, +2 for physical assistance, +2 for safety/equipment(HHA) General ADL Comments: pt with weakness, pain, decreased activity tolerance and standing balance   Cognition: Cognition Overall Cognitive Status: Impaired/Different from baseline Arousal/Alertness: Lethargic Orientation Level: Oriented X4 Attention: Sustained Sustained Attention: Appears intact Memory: Impaired Memory Impairment: Decreased short term memory Decreased Short Term Memory: Verbal basic Problem Solving: Appears intact Safety/Judgment: Appears intact Cognition Arousal/Alertness: Awake/alert Behavior During Therapy: WFL for tasks assessed/performed Overall Cognitive Status: Impaired/Different from baseline Area of Impairment: Awareness, Safety/judgement Orientation Level: Disoriented to, Situation Safety/Judgement: Decreased awareness of deficits Awareness: Emergent General Comments: Pleasant, follows all commands. Decreased insight into deficits/safety.   Blood pressure (!) 146/93, pulse 63, temperature 98.1 F (36.7 C), temperature source Oral, resp. rate 18, height 5\' 1"  (1.549 m), weight 55 kg, SpO2 97 %. Physical Exam  Vitals reviewed. Constitutional: She is oriented to person, place, and time. She appears well-developed and well-nourished.  HENT:  Head: Normocephalic and atraumatic.  Eyes: Pupils are equal, round, and reactive to light. Conjunctivae and EOM are normal.  Neck:  Patient feels dizzy and a little nauseated when she turns her head side to side  Cardiovascular: Normal rate, regular rhythm and normal Knight sounds.  No murmur heard. Respiratory: Effort normal and breath sounds normal. No respiratory distress. She has no wheezes.  GI: Soft. Bowel sounds are normal. She exhibits no distension. There is no abdominal tenderness.  Musculoskeletal:        General: No deformity or edema.     Cervical back: Normal range of motion.    Neurological: She is alert and oriented to person, place, and time. Coordination and gait abnormal.  Patient is lethargic but arousable.  Makes eye contact with examiner.  Speech is a bit dysarthric but intelligible.  Follows commands.  Provides name and age.  Patient has minimal dysmetria bilateral finger-nose-finger Sitting balance is good.  She does get dizzy when she turns her head side to side. Her motor strength is 5/5 bilateral deltoid by stress grip hip flexion extension ankle dorsiflexion  Skin: Skin is warm and dry.  Psychiatric: She has a normal mood and affect. Her behavior is normal. Judgment and thought content normal.      Lab Results Last 24 Hours       Results for orders placed or performed during the hospital encounter of 06/10/19 (from the past 24 hour(s))  CBC     Status: None    Collection Time: 06/13/19  2:18 AM  Result Value Ref Range    WBC 4.6 4.0 - 10.5 K/uL    RBC 4.40 3.87 -  5.11 MIL/uL    Hemoglobin 13.2 12.0 - 15.0 g/dL    HCT 39.1 36.0 - 46.0 %    MCV 88.9 80.0 - 100.0 fL    MCH 30.0 26.0 - 34.0 pg    MCHC 33.8 30.0 - 36.0 g/dL    RDW 13.4 11.5 - 15.5 %    Platelets 262 150 - 400 K/uL    nRBC 0.0 0.0 - 0.2 %  Basic metabolic panel     Status: Abnormal    Collection Time: 06/13/19  2:18 AM  Result Value Ref Range    Sodium 134 (L) 135 - 145 mmol/L    Potassium 4.6 3.5 - 5.1 mmol/L    Chloride 100 98 - 111 mmol/L    CO2 23 22 - 32 mmol/L    Glucose, Bld 157 (H) 70 - 99 mg/dL    BUN 18 8 - 23 mg/dL    Creatinine, Ser 0.75 0.44 - 1.00 mg/dL    Calcium 9.2 8.9 - 10.3 mg/dL    GFR calc non Af Amer >60 >60 mL/min    GFR calc Af Amer >60 >60 mL/min    Anion gap 11 5 - 15       Imaging Results (Last 48 hours)  CT HEAD WO CONTRAST   Result Date: 06/12/2019 CLINICAL DATA:  Right-sided weakness and slurred speech. Follow-up cerebellar hemorrhage. EXAM: CT HEAD WITHOUT CONTRAST TECHNIQUE: Contiguous axial images were obtained from the base of the  skull through the vertex without intravenous contrast. COMPARISON:  MRI 06/11/2019.  CT studies 06/10/2019. FINDINGS: Brain: No evidence of additional bleeding. Intraparenchymal hemorrhage within the left cerebellar hemisphere and cerebellar vermis. Mild surrounding edema. Small amount of subarachnoid blood, now migrated invisible within some of the sulci over the cerebral hemispheres. Small amount of blood dependent in the occipital horns. Ventricular size is stable. Vascular: No primary vascular lesion evident. Skull: Negative Sinuses/Orbits: Clear/normal Other: None IMPRESSION: No evidence of additional or worsened cerebellar hemorrhage. Small amount of subarachnoid and intraventricular blood as expected in this case. No new or complicating feature. No sign of obstructive hydrocephalus. Electronically Signed   By: Nelson Chimes M.D.   On: 06/12/2019 12:09    MR BRAIN WO CONTRAST   Result Date: 06/11/2019 CLINICAL DATA:  Cerebellar hemorrhage. EXAM: MRI HEAD WITHOUT CONTRAST TECHNIQUE: Multiplanar, multiecho pulse sequences of the brain and surrounding structures were obtained without intravenous contrast. COMPARISON:  CT studies done yesterday. FINDINGS: Brain: Redemonstrated is acute intraparenchymal hemorrhage in the left cerebellum and cerebellar vermis. Overall size of the hematoma is approximately 4 cm in diameter. Surrounding edema. Small intraparenchymal cystic space measuring about 8 mm in size in the left cerebellum, nature uncertain. Subarachnoid extension has occurs. Blood products are present within the cerebellar folia. Some intraventricular blood layering dependently in the occipital horns. Cerebral hemispheres show a background pattern of mild for age chronic small-vessel disease of the white matter. Lateral and third ventricles are slightly prominent. There is some potential for ventricular obstruction at the distal aqueduct and upper fourth ventricle. Vascular: Major vessels at the base of the  brain show flow. Skull and upper cervical spine: Negative Sinuses/Orbits: Clear/normal Other: None IMPRESSION: MRI redemonstrates intraparenchymal hemorrhage in the left cerebellar hemisphere and cerebellar vermis measuring about 4 cm in diameter. There is surrounding edema. Subarachnoid penetration. 8 mm cystic area seen along the left lateral margin of the hematoma. Question if this could represent a pre-existing abnormality, not specifically characterized. Small amount of subarachnoid blood has migrated  dependent within the occipital horns of the lateral ventricles. Prominence of the lateral and third ventricles could possibly relate to compromise of the distal aqueduct/upper fourth ventricle. Mild chronic small-vessel ischemic change of the cerebral hemispheric white matter otherwise. Continued follow-up suggested, including postcontrast imaging to help look for an underlying brain lesion. Electronically Signed   By: Nelson Chimes M.D.   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 Does the need for close, 24 hr/day medical supervision in concert with the patient's rehab needs make it unreasonable for this patient to be served in a less intensive setting? Yes 2. 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 Due to bladder management, bowel management, safety, skin/wound care, disease management, medication administration, pain management and patient education, does the patient require 24 hr/day rehab nursing? Yes 4. Does the patient require coordinated care of a physician, rehab nurse, therapy disciplines of PT, OT, speech to address physical and functional deficits in the context of the above medical diagnosis(es)? Yes Addressing deficits in the following areas: balance, endurance,  locomotion, strength, transferring, bowel/bladder control, bathing, dressing, feeding, toileting, speech and psychosocial support 5. Can the patient actively participate in an intensive therapy program of at least 3 hrs of therapy per day at least 5 days per week? Yes 6. The potential for patient to make measurable gains while on inpatient rehab is excellent 7. Anticipated functional outcomes upon discharge from inpatient rehab are supervision  with PT, supervision with OT, supervision with SLP. 8. Estimated rehab length of stay to reach the above functional goals is: 14 to 17 days 9. Anticipated discharge destination: Home 10. Overall Rehab/Functional Prognosis: excellent   RECOMMENDATIONS: This patient's condition is appropriate for continued rehabilitative care in the following setting: CIR Patient has agreed to participate in recommended program. Yes Note that insurance prior authorization may be required for reimbursement for recommended care.   Comment:    "I have personally performed a face to face diagnostic evaluation of this patient.  Additionally, I have reviewed and concur with the physician assistant's documentation above."     Cathlyn Parsons, PA-C 06/13/2019    Charlett Blake M.D. [facility] FAAPM&R (Neuromuscular Med) Diplomate Am Board of Electrodiagnostic Med Fellow Am Board of Interventional Pain            Revision History                     Routing History

## 2019-06-14 NOTE — H&P (Signed)
Physical Medicine and Rehabilitation Admission H&P    Chief Complaint  Patient presents with  . Code Stroke  : HPI: Tanya Knight is a 79 year old right-handed female with history of hypertension, seizure disorder maintained on Depakote.  Per chart review patient lives with spouse.  Independent prior to admission.  1 level home with 3 steps to entry.  Presented 06/10/2019 with headache, nausea/vomiting, weakness and slurred speech.  Cranial CT scan showed a 16 cc hematoma centered in the cerebellar vermis with mild subdural and subarachnoid extension.  No hydrocephalus.  Blood pressure [id number] with systolic increasing into the 160s and started on Cleviprex.  Patient had been on aspirin prior to admission and held due to hematoma.  Admission chemistry sodium 134, potassium 3.2, urine drug screen negative, WBC 11,900.  CT angiogram showed no posterior fossa aneurysm or AVM identified.  Follow-up CT/MRI showed no evidence of additional  cerebellar hemorrhage.  Echocardiogram with ejection fraction of 60% without emboli.  Neurology follow-up close monitoring of blood pressure.  Subcutaneous heparin for DVT prophylaxis initiated 06/11/2019.  Patient remained on Depakote his prior to admission.  Tolerating a regular diet.  Therapy evaluations completed and patient was admitted for a comprehensive rehab program.  Pt c/o severe headache and also c/o nausea (ate <25% of lunch as well)- made worse by the bright light in the room/sunlight- asked for lights to be turned off and window blinds to be closed- said headache was 9/10- doesn't remember when her last BM was- not even sure if it was since came to hospital.  Cannot find any sign of BM since admission on 1/29. SO LBM was 3+ days ago.  When asked How are you, she responded with "I don't know".     Review of Systems  Constitutional: Negative for chills and fever.  HENT: Negative for hearing loss.   Eyes: Positive for double vision and photophobia.  Negative for blurred vision.  Respiratory: Negative for cough and shortness of breath.   Cardiovascular: Negative for chest pain, palpitations and leg swelling.  Gastrointestinal: Positive for nausea and vomiting.       GERD  Genitourinary: Negative for dysuria, flank pain and hematuria.  Musculoskeletal: Positive for myalgias.  Skin: Negative for rash.  Neurological: Positive for dizziness, seizures, weakness and headaches.  All other systems reviewed and are negative.  Past Medical History:  Diagnosis Date  . Fracture of metatarsal    Repair of fractured R metatarsal --Dr Duda---4/08  . GERD (gastroesophageal reflux disease)   . Hypertension   . Melanoma in situ [facility]) 7/17   Dr Kellie Moor  . Osteoporosis   . Seizure disorder ([place])   . Seizures ([place])   . Vitamin D deficiency    Past Surgical History:  Procedure Laterality Date  . CATARACT EXTRACTION, BILATERAL Bilateral 2014  . EYE SURGERY     Obstructed tear duct  . FOOT SURGERY  2008  . MELANOMA EXCISION Left 11/2015   left lower leg  . TEAR DUCT PROBING  10/12   temporary stent  . TONSILLECTOMY AND ADENOIDECTOMY  1948   Family History  Problem Relation Age of Onset  . Hypertension Mother   . Heart disease Father   . Breast cancer Daughter 66  . Diabetes Maternal Aunt   . Coronary artery disease Paternal Aunt   . Breast cancer Paternal Aunt   . Coronary artery disease Paternal Uncle   . Heart disease Maternal Grandmother   . Heart disease Maternal Grandfather   .  Heart disease Paternal Grandmother   . Heart disease Paternal Grandfather   . Colon cancer Neg Hx   . Stomach cancer Neg Hx   . Pancreatic cancer Neg Hx   . Rectal cancer Neg Hx    Social History:  reports that she has never smoked. She has never used smokeless tobacco. She reports current alcohol use. She reports that she does not use drugs. Allergies:  Allergies  Allergen Reactions  . Phenytoin     Other reaction(s): Other (See Comments) Other  Reaction: Other reaction REACTION: severe reaction   Medications Prior to Admission  Medication Sig Dispense Refill  . amLODipine (NORVASC) 2.5 MG tablet TAKE 1 TABLET(2.5 MG) BY MOUTH DAILY (Patient taking differently: Take 2.5 mg by mouth at bedtime. TAKE 1 TABLET(2.5 MG) BY MOUTH DAILY) 90 tablet 0  . aspirin 81 MG tablet Take 81 mg by mouth daily.      . cholecalciferol (VITAMIN D) 1000 units tablet Take 1,000 Units by mouth daily.    . divalproex (DEPAKOTE) 500 MG DR tablet TAKE 1 TABLET(500 MG) BY MOUTH TWICE DAILY (Patient taking differently: Take 500 mg by mouth See admin instructions. Pt takes 500 mg twice daily only on Monday,Wednesday & Friday. All other days are once daily) 180 tablet 3  . metoprolol succinate (TOPROL-XL) 50 MG 24 hr tablet TAKE 1 TABLET BY MOUTH DAILY WITH OR IMMEDIATELY FOLLOWING A MEAL (Patient taking differently: Take 50 mg by mouth daily. TAKE 1 TABLET BY MOUTH DAILY WITH OR IMMEDIATELY FOLLOWING A MEAL) 90 tablet 0  . Multiple Vitamin (MULTIVITAMIN) capsule Take 1 capsule by mouth daily.      . Multiple Vitamins-Minerals (OCUVITE PO) Take by mouth.        Drug Regimen Review Drug regimen was reviewed and remains appropriate with no significant issues identified  Home: Home Living Family/patient expects to be discharged to:: Private residence Living Arrangements: Spouse/significant other Available Help at Discharge: Family, Available 24 hours/day Type of Home: House Home Access: Stairs to enter [employer] of Steps: 3 Entrance Stairs-Rails: Right, Left Home Layout: One level Bathroom Shower/Tub: Chiropodist: Standard Home Equipment: Environmental consultant - 2 wheels, [place] - single point  Lives With: Spouse   Functional History: Prior Function Level of Independence: Independent  Functional Status:  Mobility: Bed Mobility Overal bed mobility: Needs Assistance Bed Mobility: Supine to Sit Supine to sit: Min guard, HOB  elevated General bed mobility comments: Reports dizziness upon sitting. Transfers Overall transfer level: Needs assistance Equipment used: Rolling walker (2 wheeled) Transfers: Sit to/from Stand Sit to Stand: Min assist, +2 safety/equipment Stand pivot transfers: Mod assist, +2 safety/equipment General transfer comment: minA+2 for safety, pt reports increased dizziness with standing;  Cues for hand placement Ambulation/Gait Ambulation/Gait assistance: Min assist, +2 physical assistance Gait Distance (Feet): 10 Feet Assistive device: Rolling walker (2 wheeled) Gait Pattern/deviations: Step-through pattern, Decreased stride length, Ataxic, Trunk flexed, Shuffle General Gait Details: Deferred further gt training as patient was orthostatic and symptomatic. Gait velocity: decreased Gait velocity interpretation: <1.31 ft/sec, indicative of household ambulator    ADL: ADL Overall ADL's : Needs assistance/impaired Eating/Feeding: Set up, Sitting Grooming: Set up, Sitting, Oral care, Wash/dry face Grooming Details (indicate cue type and reason): completed sitting due to BP Upper Body Bathing: Min guard, Sitting Lower Body Bathing: Moderate assistance, Sit to/from stand Upper Body Dressing : Min guard, Sitting Lower Body Dressing: Set up Lower Body Dressing Details (indicate cue type and reason): donned socks while sitting in bed  Toilet Transfer: +2 for physical assistance, +2 for safety/equipment, RW, Ambulation, Minimal assistance Toilet Transfer Details (indicate cue type and reason): simulated Toileting- Clothing Manipulation and Hygiene: +2 for physical assistance, +2 for safety/equipment, Minimal assistance Toileting - Clothing Manipulation Details (indicate cue type and reason): pt able to perform some pericare vial lateral leans, assist for gown management Functional mobility during ADLs: +2 for physical assistance, +2 for safety/equipment, Rolling walker, Minimal assistance General  ADL Comments: pt limited by orthostatic BP;decreased proprioception due to diplopia  Cognition: Cognition Overall Cognitive Status: Impaired/Different from baseline Arousal/Alertness: Lethargic Orientation Level: Oriented X4 Attention: Sustained Sustained Attention: Appears intact Memory: Impaired Memory Impairment: Decreased short term memory Decreased Short Term Memory: Verbal basic Problem Solving: Appears intact Safety/Judgment: Appears intact Cognition Arousal/Alertness: Awake/alert Behavior During Therapy: WFL for tasks assessed/performed Overall Cognitive Status: Impaired/Different from baseline Area of Impairment: Awareness, Safety/judgement Orientation Level: Disoriented to, Situation Safety/Judgement: Decreased awareness of deficits Awareness: Emergent General Comments: pt following all commands, appears to have decreased insight into deficits  Physical Exam: Blood pressure 140/60, pulse 60, temperature 98.3 F (36.8 C), temperature source Oral, resp. rate 18, height 5\' 1"  (1.549 m), weight 55 kg, SpO2 98 %. Physical Exam  Nursing note and vitals reviewed. Constitutional:  Frail thin appearing female- who actually is burrowed into covers, bruises along L jawline, L upper lip and B/L wrists/dorsum of hands, mumbling, not always making sense and speech was slowed and slightly slurred.    HENT:  Head: Normocephalic and atraumatic.  Right Ear: External ear normal.  Left Ear: External ear normal.  Nose: Nose normal.  No facial droop- Sensation intact to light touch on both sides of face; tongue midline   Eyes: Right eye exhibits no discharge. Left eye exhibits no discharge. No scleral icterus.  R eye couldn't cross midline to look left and L did appears grossly to do the same- had horizontal nystagmus with R eye- c/o diplopia and kept closing R eye "to be more comfortable".   Neck: No tracheal deviation present.  Cardiovascular:  RRR, no M/R/G  Respiratory:  CTA  B/L- no W/R/R  GI:  Soft, NT, slightly distended; hypoactive BS  Genitourinary:    Genitourinary Comments: Had purewick- small amount of medium amber urine in container   Musculoskeletal:     Cervical back: Normal range of motion and neck supple.     Comments: Couldn't completely comply with exam- tried to, but kept falling asleep Appears to be at least 4-/5 in UEs B/L and 4-/5 in LEs Tested throughout the deltoids, biceps, triceps, grip and finger abduction Also HF, KE, DF, and PF   Neurological:  Patient is alert sitting up in bed.  Makes eye contact with examiner.  Speech is dysarthric but intelligible.  Follows simple commands.  Provides name and age.  Limited overall awareness of deficits.  Attempted to follow commands, but slow to process, slow to respond, and very lethargic- fell asleep in middle of doing some simple commands No hoffman's no Clonus in UEs and LEs B/L Sensation intact to light touch in all 4 extremities    Results for orders placed or performed during the hospital encounter of 06/10/19 (from the past 48 hour(s))  CBC     Status: None   Collection Time: 06/13/19  2:18 AM  Result Value Ref Range   WBC 4.6 4.0 - 10.5 K/uL   RBC 4.40 3.87 - 5.11 MIL/uL   Hemoglobin 13.2 12.0 - 15.0 g/dL   HCT 39.1 36.0 -  46.0 %   MCV 88.9 80.0 - 100.0 fL   MCH 30.0 26.0 - 34.0 pg   MCHC 33.8 30.0 - 36.0 g/dL   RDW 13.4 11.5 - 15.5 %   Platelets 262 150 - 400 K/uL   nRBC 0.0 0.0 - 0.2 %    Comment: Performed at [facility], [place] [address], [place], [place] [id number]  Basic metabolic panel     Status: Abnormal   Collection Time: 06/13/19  2:18 AM  Result Value Ref Range   Sodium 134 (L) 135 - 145 mmol/L   Potassium 4.6 3.5 - 5.1 mmol/L   Chloride 100 98 - 111 mmol/L   CO2 23 22 - 32 mmol/L   Glucose, Bld 157 (H) 70 - 99 mg/dL   BUN 18 8 - 23 mg/dL   Creatinine, Ser 0.75 0.44 - 1.00 mg/dL   Calcium 9.2 8.9 - 10.3 mg/dL   GFR calc non Af Amer >60 >60 mL/min   GFR  calc Af Amer >60 >60 mL/min   Anion gap 11 5 - 15    Comment: Performed at [facility] [address] [place] [address]  CBC     Status: None   Collection Time: 06/14/19  4:29 AM  Result Value Ref Range   WBC 9.8 4.0 - 10.5 K/uL   RBC 4.44 3.87 - 5.11 MIL/uL   Hemoglobin 13.6 12.0 - 15.0 g/dL   HCT 40.2 36.0 - 46.0 %   MCV 90.5 80.0 - 100.0 fL   MCH 30.6 26.0 - 34.0 pg   MCHC 33.8 30.0 - 36.0 g/dL   RDW 13.9 11.5 - 15.5 %   Platelets 298 150 - 400 K/uL   nRBC 0.0 0.0 - 0.2 %    Comment: Performed at [facility], [place] [address], [place], [place] [id number]  Basic metabolic panel     Status: Abnormal   Collection Time: 06/14/19  4:29 AM  Result Value Ref Range   Sodium 133 (L) 135 - 145 mmol/L   Potassium 3.7 3.5 - 5.1 mmol/L   Chloride 99 98 - 111 mmol/L   CO2 24 22 - 32 mmol/L   Glucose, Bld 102 (H) 70 - 99 mg/dL   BUN 23 8 - 23 mg/dL   Creatinine, Ser 0.64 0.44 - 1.00 mg/dL   Calcium 9.0 8.9 - 10.3 mg/dL   GFR calc non Af Amer >60 >60 mL/min   GFR calc Af Amer >60 >60 mL/min   Anion gap 10 5 - 15    Comment: Performed at [facility], [place] [address] [place], [place] [address]   CT HEAD WO CONTRAST  Result Date: 06/12/2019 CLINICAL DATA:  Right-sided weakness and slurred speech. Follow-up cerebellar hemorrhage. EXAM: CT HEAD WITHOUT CONTRAST TECHNIQUE: Contiguous axial images were obtained from the base of the skull through the vertex without intravenous contrast. COMPARISON:  MRI 06/11/2019.  CT studies 06/10/2019. FINDINGS: Brain: No evidence of additional bleeding. Intraparenchymal hemorrhage within the left cerebellar hemisphere and cerebellar vermis. Mild surrounding edema. Small amount of subarachnoid blood, now migrated invisible within some of the sulci over the cerebral hemispheres. Small amount of blood dependent in the occipital horns. Ventricular size is stable. Vascular: No primary vascular lesion evident. Skull: Negative Sinuses/Orbits:  Clear/normal Other: None IMPRESSION: No evidence of additional or worsened cerebellar hemorrhage. Small amount of subarachnoid and intraventricular blood as expected in this case. No new or complicating feature. No sign of  obstructive hydrocephalus. Electronically Signed   By: Nelson Chimes M.D.   On: 06/12/2019 12:09       Medical Problem List and Plan: 1.  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   -patient may  shower  -ELOS/Goals: ~ 2.5 to 3 weeks/ Goals CGA 2.  Antithrombotics: -DVT/anticoagulation: Subcutaneous heparin initiated 06/11/2019  -antiplatelet therapy: N/A 3. Pain Management: Tramadol as needed, Fioricet as needed for headaches 4. Mood: Provide emotional support  -antipsychotic agents: N/A 5. Neuropsych: This patient is NOT capable of making decisions on her own behalf. 6. Skin/Wound Care: Routine skin checks 7. Fluids/Electrolytes/Nutrition: Routine in and outs with follow-up chemistries 8.  Hypertension.  Norvasc 10 mg daily, Toprol-XL 50 mg daily.  Monitor with increased mobility- monitor closely.  9.  History of seizure disorder.  Continue Depakote 500 mg every 12 hours For old seizure d/o as well as prevention of new seizures 10. Diplopia- suggest eye patch intermittently to help with double vision 11. Hypokalemia- last K+ 3.2- was repleted- will monitor 12. Severe daily headaches due to [place]- could add Topamax vs increasing Depakote for HA control-  13. Nausea/+/- vomiting- suggest zofran since less sedating- although can cause constipation- try prn, or scheduled before breakfast to help with nausea.   Lavon Paganini Angiulli, PA-C 06/14/2019   I have personally performed a face to face diagnostic evaluation of this patient and formulated the key components of the plan.  Additionally, I have personally reviewed laboratory data,  imaging studies, as well as relevant notes and concur with the physician assistant's documentation above.   The patient's status has not changed from the original H&P.  Any changes in documentation from the acute care chart have been noted above.

## 2019-06-14 NOTE — Progress Notes (Signed)
Pt medicated with fioricet for c/o of headache

## 2019-06-14 NOTE — Discharge Summary (Addendum)
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, likely hypertensive with cytotoxic edema but no hydrocephalus Active Problems:   Essential hypertension, benign   Osteoporosis   Seizure disorder ([place])   Hyperlipidemia   Medications to be continued on Rehab Allergies as of 06/14/2019       Reactions   Phenytoin    Other reaction(s): Other (See Comments) Other Reaction: Other reaction REACTION: severe reaction        Medication List     STOP taking these medications    aspirin 81 MG tablet   losartan 100 MG tablet Commonly known as: COZAAR       TAKE these medications    amLODipine 10 MG tablet Commonly known as: NORVASC Take 1 tablet (10 mg total) by mouth daily. Start taking on: June 15, 2019 What changed:  medication strength how much to take how to take this when to take this additional instructions   butalbital-acetaminophen-caffeine 50-325-40 MG tablet Commonly known as: FIORICET Take 1 tablet by mouth every 8 (eight) hours as needed for headache.   Chlorhexidine Gluconate Cloth 2 % Pads Apply 6 each topically daily. Start taking on: June 15, 2019   cholecalciferol 1000 units tablet Commonly known as: VITAMIN D Take 1,000 Units by mouth daily.   divalproex 500 MG DR tablet Commonly known as: DEPAKOTE Take 1 tablet (500 mg total) by mouth every 12 (twelve) hours. What changed:  how much to take how to take this when to take this additional instructions   heparin 5000 UNIT/ML injection Inject 1 mL (5,000 Units total) into the skin every 12 (twelve) hours.   labetalol 5 MG/ML  injection Commonly known as: NORMODYNE Inject 2-4 mLs (10-20 mg total) into the vein every 10 (ten) minutes as needed (BP < 160).   metoprolol succinate 50 MG 24 hr tablet Commonly known as: TOPROL-XL Take 1 tablet (50 mg total) by mouth daily. Take with or immediately following a meal. What changed:  how much to take how to take this when to take this additional instructions   multivitamin capsule Take 1 capsule by mouth daily.   OCUVITE PO Take by mouth.   ondansetron 4 MG/2ML Soln injection Commonly known as: ZOFRAN Inject 1 mL (2 mg total) into the vein every 6 (six) hours as needed for nausea or vomiting.   pantoprazole 40 MG tablet Commonly known as: PROTONIX Take 1 tablet (40 mg total) by mouth daily. Start taking on: June 15, 2019   senna-docusate 8.6-50 MG tablet Commonly known as: Senokot-S Take 1 tablet by mouth 2 (two) times daily.   traMADol 50 MG tablet Commonly known as: ULTRAM Take 1 tablet (50 mg total) by mouth every 6 (six) hours as needed (head and back pain).        LABORATORY STUDIES CBC    Component Value Date/Time   WBC 9.8 06/14/2019 0429   RBC 4.44 06/14/2019 0429   HGB 13.6 06/14/2019 0429   HCT 40.2 06/14/2019 0429   PLT 298 06/14/2019 0429   MCV 90.5 06/14/2019 0429   MCH 30.6 06/14/2019 0429   MCHC  33.8 06/14/2019 0429   RDW 13.9 06/14/2019 0429   LYMPHSABS 0.7 06/10/2019 0519   MONOABS 0.5 06/10/2019 0519   EOSABS 0.0 06/10/2019 0519   BASOSABS 0.0 06/10/2019 0519   CMP    Component Value Date/Time   NA 133 (L) 06/14/2019 0429   K 3.7 06/14/2019 0429   CL 99 06/14/2019 0429   CO2 24 06/14/2019 0429   GLUCOSE 102 (H) 06/14/2019 0429   BUN 23 06/14/2019 0429   CREATININE 0.64 06/14/2019 0429   CALCIUM 9.0 06/14/2019 0429   PROT 7.4 06/10/2019 0519   ALBUMIN 4.4 06/10/2019 0519   AST 30 06/10/2019 0519   ALT 17 06/10/2019 0519   ALKPHOS 60 06/10/2019 0519   BILITOT 0.3 06/10/2019 0519   GFRNONAA >60 06/14/2019  0429   GFRAA >60 06/14/2019 0429   COAGS Lab Results  Component Value Date   INR 1.0 06/10/2019   Lipid Panel    Component Value Date/Time   CHOL 219 (H) 06/11/2019 0241   TRIG 73 06/11/2019 0241   HDL 84 06/11/2019 0241   CHOLHDL 2.6 06/11/2019 0241   VLDL 15 06/11/2019 0241   LDLCALC 120 (H) 06/11/2019 0241   HgbA1C  Lab Results  Component Value Date   HGBA1C 5.1 06/11/2019   Urinalysis    Component Value Date/Time   COLORURINE STRAW (A) 06/10/2019 0623   APPEARANCEUR CLEAR 06/10/2019 0623   LABSPEC 1.007 06/10/2019 0623   PHURINE 7.0 06/10/2019 0623   GLUCOSEU >=500 (A) 06/10/2019 0623   HGBUR NEGATIVE 06/10/2019 0623   BILIRUBINUR NEGATIVE 06/10/2019 0623   BILIRUBINUR neg 08/09/2012 1110   KETONESUR NEGATIVE 06/10/2019 0623   PROTEINUR NEGATIVE 06/10/2019 0623   UROBILINOGEN negative 08/09/2012 1110   NITRITE NEGATIVE 06/10/2019 0623   LEUKOCYTESUR NEGATIVE 06/10/2019 0623   Urine Drug Screen     Component Value Date/Time   LABOPIA NONE DETECTED 06/10/2019 0623   COCAINSCRNUR NONE DETECTED 06/10/2019 0623   LABBENZ NONE DETECTED 06/10/2019 0623   AMPHETMU NONE DETECTED 06/10/2019 0623   THCU NONE DETECTED 06/10/2019 0623   LABBARB NONE DETECTED 06/10/2019 0623    Alcohol Level    Component Value Date/Time   ETH <10 06/10/2019 0519    SIGNIFICANT DIAGNOSTIC STUDIES CT ANGIO HEAD W OR WO CONTRAST  Result Date: 06/10/2019 CLINICAL DATA:  Subarachnoid hemorrhage; cerebral hemorrhage suspected. EXAM: CT ANGIOGRAPHY HEAD TECHNIQUE: Multidetector CT imaging of the head was performed using the standard protocol during bolus administration of intravenous contrast. Multiplanar CT image reconstructions and MIPs were obtained to evaluate the vascular anatomy. CONTRAST:  70mL OMNIPAQUE IOHEXOL 350 MG/ML SOLN COMPARISON:  Head CT 06/10/2019 FINDINGS: CTA HEAD Anterior circulation: The intracranial internal carotid arteries are patent bilaterally. Scattered calcified  plaque within these vessels but no significant stenosis. The bilateral middle and anterior cerebral arteries are patent without significant proximal stenosis. 2 mm inferiorly projecting aneurysm arising from the supraclinoid right ICA (series 8, image 76) (series 12, image 108). Posterior circulation: The intracranial vertebral arteries are patent without significant stenosis. The left vertebral artery is non dominant and developmentally diminutive beyond he left vertebral artery the origin of the left PICA. The basilar artery is patent without significant stenosis. Flow is seen within the proximal PICA, AICA and SCA vessels bilaterally. Predominantly fetal origin of the left posterior cerebral artery. The bilateral posterior cerebral arteries are patent without significant stenosis. No posterior fossa aneurysm or AVM is identified. No contrast blush is demonstrated to suggest active arterial extravasation. Venous sinuses: Poorly  assessed due to contrast timing. Anatomic variants: As described. IMPRESSION: No posterior fossa aneurysm or AVM is identified. Consider repeat vascular imaging once the hematoma involutes for further assessment. No intracranial large vessel occlusion or proximal high-grade arterial stenosis. 2 mm supraclinoid right ICA aneurysm. Electronically Signed   By: Kellie Simmering DO   On: 06/10/2019 09:53   CT HEAD WO CONTRAST  Result Date: 06/12/2019 CLINICAL DATA:  Right-sided weakness and slurred speech. Follow-up cerebellar hemorrhage. EXAM: CT HEAD WITHOUT CONTRAST TECHNIQUE: Contiguous axial images were obtained from the base of the skull through the vertex without intravenous contrast. COMPARISON:  MRI 06/11/2019.  CT studies 06/10/2019. FINDINGS: Brain: No evidence of additional bleeding. Intraparenchymal hemorrhage within the left cerebellar hemisphere and cerebellar vermis. Mild surrounding edema. Small amount of subarachnoid blood, now migrated invisible within some of the sulci over  the cerebral hemispheres. Small amount of blood dependent in the occipital horns. Ventricular size is stable. Vascular: No primary vascular lesion evident. Skull: Negative Sinuses/Orbits: Clear/normal Other: None IMPRESSION: No evidence of additional or worsened cerebellar hemorrhage. Small amount of subarachnoid and intraventricular blood as expected in this case. No new or complicating feature. No sign of obstructive hydrocephalus. Electronically Signed   By: Nelson Chimes M.D.   On: 06/12/2019 12:09   MR BRAIN WO CONTRAST  Result Date: 06/11/2019 CLINICAL DATA:  Cerebellar hemorrhage. EXAM: MRI HEAD WITHOUT CONTRAST TECHNIQUE: Multiplanar, multiecho pulse sequences of the brain and surrounding structures were obtained without intravenous contrast. COMPARISON:  CT studies done yesterday. FINDINGS: Brain: Redemonstrated is acute intraparenchymal hemorrhage in the left cerebellum and cerebellar vermis. Overall size of the hematoma is approximately 4 cm in diameter. Surrounding edema. Small intraparenchymal cystic space measuring about 8 mm in size in the left cerebellum, nature uncertain. Subarachnoid extension has occurs. Blood products are present within the cerebellar folia. Some intraventricular blood layering dependently in the occipital horns. Cerebral hemispheres show a background pattern of mild for age chronic small-vessel disease of the white matter. Lateral and third ventricles are slightly prominent. There is some potential for ventricular obstruction at the distal aqueduct and upper fourth ventricle. Vascular: Major vessels at the base of the brain show flow. Skull and upper cervical spine: Negative Sinuses/Orbits: Clear/normal Other: None IMPRESSION: MRI redemonstrates intraparenchymal hemorrhage in the left cerebellar hemisphere and cerebellar vermis measuring about 4 cm in diameter. There is surrounding edema. Subarachnoid penetration. 8 mm cystic area seen along the left lateral margin of the  hematoma. Question if this could represent a pre-existing abnormality, not specifically characterized. Small amount of subarachnoid blood has migrated dependent within the occipital horns of the lateral ventricles. Prominence of the lateral and third ventricles could possibly relate to compromise of the distal aqueduct/upper fourth ventricle. Mild chronic small-vessel ischemic change of the cerebral hemispheric white matter otherwise. Continued follow-up suggested, including postcontrast imaging to help look for an underlying brain lesion. Electronically Signed   By: Nelson Chimes M.D.   On: 06/11/2019 11:51   ECHOCARDIOGRAM COMPLETE  Result Date: 06/10/2019   ECHOCARDIOGRAM REPORT   Patient Name:   Tanya Knight Date of Exam: 06/10/2019 Medical Rec #:  [id number]       Height:       61.0 in Accession #:    [id number]      Weight:       121.3 lb Date of Birth:  06-24-1940       BSA:          1.53 m Patient Age:  78 years        BP:           132/62 mmHg Patient Gender: F               HR:           74 bpm. Exam Location:  Inpatient Procedure: 2D Echo, Cardiac Doppler and Color Doppler Indications:    Stroke 434.91  History:        Patient has no prior history of Echocardiogram examinations.                 Risk Factors:Hypertension and Non-Smoker.  Sonographer:    Paulita Fujita RDCS Referring Phys: [id number] Norris XU IMPRESSIONS  1. Left ventricular ejection fraction, by visual estimation, is 55 to 60%. The left ventricle has normal function. There is no left ventricular hypertrophy.  2. Left ventricular diastolic parameters are consistent with Grade I diastolic dysfunction (impaired relaxation).  3. The left ventricle has no regional wall motion abnormalities.  4. Global right ventricle has normal systolic function.The right ventricular size is normal. No increase in right ventricular wall thickness.  5. Left atrial size was normal.  6. Right atrial size was normal.  7. The mitral valve is normal in  structure. Trivial mitral valve regurgitation. No evidence of mitral stenosis.  8. The tricuspid valve is normal in structure.  9. The tricuspid valve is normal in structure. Tricuspid valve regurgitation is not demonstrated. 10. The aortic valve is normal in structure. Aortic valve regurgitation is trivial. Mild aortic valve sclerosis without stenosis. 11. The pulmonic valve was normal in structure. Pulmonic valve regurgitation is not visualized. 12. The inferior vena cava is normal in size with greater than 50% respiratory variability, suggesting right atrial pressure of 3 mmHg. FINDINGS  Left Ventricle: Left ventricular ejection fraction, by visual estimation, is 55 to 60%. The left ventricle has normal function. The left ventricle has no regional wall motion abnormalities. There is no left ventricular hypertrophy. Left ventricular diastolic parameters are consistent with Grade I diastolic dysfunction (impaired relaxation). Indeterminate filling pressures. Right Ventricle: The right ventricular size is normal. No increase in right ventricular wall thickness. Global RV systolic function is has normal systolic function. Left Atrium: Left atrial size was normal in size. Right Atrium: Right atrial size was normal in size Pericardium: There is no evidence of pericardial effusion. Mitral Valve: The mitral valve is normal in structure. Trivial mitral valve regurgitation. No evidence of mitral valve stenosis by observation. Tricuspid Valve: The tricuspid valve is normal in structure. Tricuspid valve regurgitation is not demonstrated. Aortic Valve: The aortic valve is normal in structure. Aortic valve regurgitation is trivial. Mild aortic valve sclerosis is present, with no evidence of aortic valve stenosis. Pulmonic Valve: The pulmonic valve was normal in structure. Pulmonic valve regurgitation is not visualized. Pulmonic regurgitation is not visualized. Aorta: The aortic root, ascending aorta and aortic arch are all  structurally normal, with no evidence of dilitation or obstruction. Venous: The inferior vena cava is normal in size with greater than 50% respiratory variability, suggesting right atrial pressure of 3 mmHg. IAS/Shunts: No atrial level shunt detected by color flow Doppler. There is no evidence of a patent foramen ovale. No ventricular septal defect is seen or detected. There is no evidence of an atrial septal defect.  LEFT VENTRICLE PLAX 2D LVIDd:         4.60 cm       Diastology LVIDs:  3.60 cm       LV e' lateral:   6.85 cm/s LV PW:         0.70 cm       LV E/e' lateral: 9.5 LV IVS:        0.70 cm       LV e' medial:    4.90 cm/s LVOT diam:     1.40 cm       LV E/e' medial:  13.3 LV SV:         43 ml LV SV Index:   27.76 LVOT Area:     1.54 cm  LV Volumes (MOD) LV area d, A2C:    24.70 cm LV area d, A4C:    30.70 cm LV area s, A2C:    15.10 cm LV area s, A4C:    19.80 cm LV major d, A2C:   6.75 cm LV major d, A4C:   7.15 cm LV major s, A2C:   5.38 cm LV major s, A4C:   6.00 cm LV vol d, MOD A2C: 75.7 ml LV vol d, MOD A4C: 107.0 ml LV vol s, MOD A2C: 35.4 ml LV vol s, MOD A4C: 53.3 ml LV SV MOD A2C:     40.3 ml LV SV MOD A4C:     107.0 ml LV SV MOD BP:      46.5 ml RIGHT VENTRICLE RV S prime:     9.57 cm/s TAPSE (M-mode): 1.7 cm LEFT ATRIUM             Index       RIGHT ATRIUM          Index LA diam:        3.50 cm 2.29 cm/m  RA Area:     9.43 cm LA Vol (A2C):   39.3 ml 25.74 ml/m RA Volume:   20.00 ml 13.10 ml/m LA Vol (A4C):   40.4 ml 26.46 ml/m LA Biplane Vol: 41.6 ml 27.24 ml/m  AORTIC VALVE LVOT Vmax:   94.60 cm/s LVOT Vmean:  64.000 cm/s LVOT VTI:    0.197 m  AORTA Ao Root diam: 3.40 cm MITRAL VALVE MV Area (PHT): 2.91 cm             SHUNTS MV PHT:        75.69 msec           Systemic VTI:  0.20 m MV Decel Time: 261 msec             Systemic Diam: 1.40 cm MV E velocity: 65.10 cm/s 103 cm/s MV A velocity: 93.40 cm/s 70.3 cm/s MV E/A ratio:  0.70       1.5  Mihai Croitoru MD Electronically  signed by Sanda Klein MD Signature Date/Time: 06/10/2019/4:55:35 PM    Final    CT HEAD CODE STROKE WO CONTRAST  Result Date: 06/10/2019 CLINICAL DATA:  Code stroke. Right-sided weakness and slurred speech EXAM: CT HEAD WITHOUT CONTRAST TECHNIQUE: Contiguous axial images were obtained from the base of the skull through the vertex without intravenous contrast. COMPARISON:  None available FINDINGS: Brain: Hemorrhage in the vermis estimated at 3.2 x 3.1 x 3.3 cm (there is extensive complicating artifact from patient's dental amalgam) with extension into the subdural space inferior to the tentorium and small volume subarachnoid hemorrhage seen at the level of the interpeduncular cistern and right sylvian fissure. Intraventricular extension is not seen. There is mass effect on the fourth ventricle without hydrocephalus. Background brain appearance is  normal for age. No overt mass. Vascular: No hyperdense vessel or unexpected calcification. Skull: Normal. Negative for fracture or focal lesion. Sinuses/Orbits: Bilateral cataract resection Other: Critical Value/emergent results were called by telephone at the time of interpretation on 06/10/2019 at 5:25 am to provider A[place]or, who is already aware ASPECTS [facility] Stroke Program Early CT Score) Not scored in this setting IMPRESSION: 16 cc hematoma centered in the cerebellar vermis with mild subdural and subarachnoid extension. An underlying cause is not identified and vascular imaging is recommended when appropriate. There is narrowing of the fourth ventricle without hydrocephalus. Electronically Signed   By: Monte Fantasia M.D.   On: 06/10/2019 05:27       HISTORY OF PRESENT ILLNESS Tanya Knight is a 79 y.o. female with past medical history significant for hypertension, seizures presents to the emergency department with complaints of headache, nausea vomiting slurred speech and weakness resulting in a fall.   Patient states that she was last known normal  around 11:30 PM on 06/09/2019 when she suddenly felt a sharp headache.  Shortly after she felt nauseated and started vomiting and went to bed but she was unable to go back to sleep.  Around 5 AM when she went to get out of from her bed she fell which prompted her to present to the emergency department.   Code stroke was activated in the ER, stat CT head was performed which showed a 16 cm hemorrhage in the cerebellum warmness with subdural and subarachnoid extension.There is fourth ventricle effacement with no hydrocephalus at the moment.  Blood pressure was 144/101 initially.  Blood pressure increased up to 0000000 systolic so patient received labetalol and started on Cleviprex.  Patient is on aspirin 81 mg daily.  Platelet count 299, INR 1. NIHSS: 3. Baseline MRS 0. ICH Score 1.   HOSPITAL COURSE Tanya Knight is a 79 y.o. female with history of HTN and seizures presenting with sudden onset HA, nausea, vomiting, slurred speech and weakness resulting in a fall. Hemorrhage felt to be hypertensive in etiology. BP now controlled. Pt still with significant nausea, dizziness from hemorrhage. Will transfer to CIR for ongoing therapies.     ICH:  Cerebellar vermis ICH with SDH and SAH, likely hypertensive Code Stroke CT head 16cc hematoma cerebellar vermis w/ mild SDH and SAH. 4th ventricle narrowing w/o hydrocephalus CTA head no AVM or aneurysm seen. No LVO or stenosis. 26mm supraclinoid R ICA aneurysm.  MRI - stable hematoma, SDH, SAH, trace IVH, no hydrocephalus, no obvious CAA CT head 1/31 - stable hematoma, no hydro 2D Echo - EF 55 to 60%. No cardiac source of emboli identified.  LDL - 120 HgbA1c - 5.1 UDS neg Heparin subq for VTE prophylaxis aspirin 81 mg daily prior to admission, now on No antithrombotic given hemorrhage  Therapy recommendations:  CIR   Disposition:  CIR (lives w/ husband, son supportive)   HA and Low Back Pain Could be related to elevated BP and stroke Increase norvasc CT no  hydro and stable hematoma On fioricet and tylenol PRN Add tramadol PRN HA much improved   Hypertension Home meds:  losartan 100, amlodipine 2.5, metoprolol 50 Stable at 140s On amlodipine 10 now Continue metoprolol 50 SBP goal < 160 Labetalol PRN Long-term BP goal normotensive   Hyperlipidemia  LDL 120 Not on statin PTA Hold statin given hemorrhage Consider addition of statin at time of follow up  Hx of seizure One time seizure in 1997 On depakote 500 bid -  continue   Other Stroke Risk Factors Advanced age ETOH use, alcohol level <10, advised to drink no more than 1 drink(s) a day   Other Active Problems Hypokalemia, resolved Hx melanoma insitu LLE Mild Leukocytosis, resolved  DISCHARGE EXAM Blood pressure (!) 151/68, pulse 64, temperature 98.8 F (37.1 C), temperature source Oral, resp. rate 16, height 5\' 1"  (1.549 m), weight 55 kg, SpO2 100 %. General - Thin built, frail elderly Caucasian lady, in no apparent distress.   Ophthalmologic - fundi not visualized due to noncooperation.   Cardiovascular - Regular rhythm and rate.   Mental Status -  Level of arousal and orientation to time, place, and person were intact. Language including expression, naming, repetition, comprehension was assessed and found intact. Mild dysarthria Fund of Knowledge was assessed and was intact.   Cranial Nerves II - XII - II - Visual field intact OU. III, IV, VI - Extraocular movements intact. V - Facial sensation intact bilaterally. VII - Facial movement intact bilaterally. VIII - Hearing intact bilaterally.   X - Palate elevates symmetrically.   XI - Chin turning & shoulder shrug intact bilaterally. XII - Tongue protrusion intact.   Motor Strength - The patient's strength was symmetrical in all extremities and pronator drift was absent.  Bulk was normal and fasciculations were absent.   Motor Tone - Muscle tone was assessed at the neck and appendages and was normal.   Reflexes  - The patient's reflexes were symmetrical in all extremities and she had no pathological reflexes.   Sensory - Light touch, temperature/pinprick were assessed and were symmetrical.     Coordination - The patient has dysmetria on left FTN, no significant dysmetria on HTS bilaterally although slow action.  Tremor was absent.   Gait and Station - deferred.   Discharge Diet  Regular Thin liquids  DISCHARGE PLAN Disposition:  Transfer to [place] for ongoing PT, OT and ST Due to hemorrhage and risk of bleeding, do not take aspirin, aspirin-containing medications, or ibuprofen products  Recommend ongoing stroke risk factor control by Primary Care Physician at time of discharge from inpatient rehabilitation. Follow-up PCP Viviana Simpler I, MD in 2 weeks following discharge from rehab. Follow-up in [facility] in 4 weeks following discharge from rehab, office to schedule an appointment.   32 minutes were spent preparing discharge.  Burnetta Sabin, MSN, APRN, ANVP-BC, AGPCNP-BC Advanced Practice Stroke Nurse [place] for Schedule & Pager information 06/14/2019 1:09 PM  I have personally obtained history,examined this patient, reviewed notes, independently viewed imaging studies, participated in medical decision making and plan of care.ROS completed by me personally and pertinent positives fully documented  I have made any additions or clarifications directly to the above note. Agree with note above.    Antony Contras, MD Medical Director [place] Pager: [phone] 06/14/2019 2:04 PM

## 2019-06-14 NOTE — Progress Notes (Signed)
  Speech Language Pathology Treatment: Cognitive-Linquistic  Patient Details Name: RESHONDA DIOS MRN: [id number] DOB: November 24, 1940 Today's Date: 06/14/2019 Time: [id number] SLP Time Calculation (min) (ACUTE ONLY): 15 min  Assessment / Plan / Recommendation Clinical Impression  Patient received at bedside for skilled ST targeting recall and dysarthria. Patient complaining of right sided headache and nausea, but agreeable to participation in session. Patient's speech approximately 80% intelligible at the conversational level, but noted to be sluggish. Sluggishness likely at least somewhat impacted by patient's lethargy.  ST provided education re: strategies to increase intelligibility and articulatory precision including over-articulation, slowing rate. Pt led in exercises and able to demonstrate ability to use strategies with min verbal cueing. ST also provided education re: compensatory strategies for recall including writing things down, repetition and association. Pt reports she uses a calendar at home to write down/track appointments for herself and her husband. Pt verbally accepted strategies. Pt complaining of worsening headache as session continued, complaining of some double vision. ST left to alert RN, RN entered room to assess patient and ST ended session. ST to follow as per POC.   HPI HPI: JACQULINE SHINES is a 79 y.o. female with a hx of GERD, hypertension, seizures presents to the Emergency Department complaining of acute, persistent generalized headache with associated slurred speech and vomiting onset around 11:30PM. Patient reports that she laid in bed but was unable to go back to sleep.  Intermittent vomiting throughout the night. She reports around 5 AM she went to get out of bed, was very imbalanced and fell.  Patient did strike her head at that time, no known LOC. Patient last known well at 10 PM when she went to bed.  Patient takes aspirin, but no anticoagulants. She lives with her  [place] with current plan of care       Recommendations                   SLP Visit Diagnosis: Cognitive communication deficit [id number]) Plan: Continue with current plan of care       Gibbon, M.Ed., [place] [facility] Acute Rehabilitation [phone]: Acute Rehab office [phone] - pager   Zeyad Delaguila 06/14/2019, 11:22 AM

## 2019-06-15 ENCOUNTER — Inpatient Hospital Stay (HOSPITAL_COMMUNITY): Payer: Medicare PPO

## 2019-06-15 ENCOUNTER — Inpatient Hospital Stay (HOSPITAL_COMMUNITY): Payer: Medicare PPO | Admitting: Speech Pathology

## 2019-06-15 ENCOUNTER — Inpatient Hospital Stay (HOSPITAL_COMMUNITY): Payer: Medicare PPO | Admitting: Occupational Therapy

## 2019-06-15 DIAGNOSIS — I614 Nontraumatic intracerebral hemorrhage in cerebellum: Secondary | ICD-10-CM

## 2019-06-15 LAB — CBC WITH DIFFERENTIAL/PLATELET
Abs Immature Granulocytes: 0.1 10*3/uL — ABNORMAL HIGH (ref 0.00–0.07)
Basophils Absolute: 0 10*3/uL (ref 0.0–0.1)
Basophils Relative: 0 %
Eosinophils Absolute: 0 10*3/uL (ref 0.0–0.5)
Eosinophils Relative: 0 %
HCT: 41.1 % (ref 36.0–46.0)
Hemoglobin: 14.1 g/dL (ref 12.0–15.0)
Immature Granulocytes: 1 %
Lymphocytes Relative: 10 %
Lymphs Abs: 1.1 10*3/uL (ref 0.7–4.0)
MCH: 30.1 pg (ref 26.0–34.0)
MCHC: 34.3 g/dL (ref 30.0–36.0)
MCV: 87.6 fL (ref 80.0–100.0)
Monocytes Absolute: 1.1 10*3/uL — ABNORMAL HIGH (ref 0.1–1.0)
Monocytes Relative: 10 %
Neutro Abs: 8.6 10*3/uL — ABNORMAL HIGH (ref 1.7–7.7)
Neutrophils Relative %: 79 %
Platelets: 356 10*3/uL (ref 150–400)
RBC: 4.69 MIL/uL (ref 3.87–5.11)
RDW: 13.4 % (ref 11.5–15.5)
WBC: 10.9 10*3/uL — ABNORMAL HIGH (ref 4.0–10.5)
nRBC: 0 % (ref 0.0–0.2)

## 2019-06-15 LAB — URINALYSIS, ROUTINE W REFLEX MICROSCOPIC
Bilirubin Urine: NEGATIVE
Glucose, UA: NEGATIVE mg/dL
Ketones, ur: 20 mg/dL — AB
Leukocytes,Ua: NEGATIVE
Nitrite: NEGATIVE
Protein, ur: NEGATIVE mg/dL
Specific Gravity, Urine: 1.02 (ref 1.005–1.030)
pH: 6 (ref 5.0–8.0)

## 2019-06-15 LAB — COMPREHENSIVE METABOLIC PANEL
ALT: 13 U/L (ref 0–44)
AST: 18 U/L (ref 15–41)
Albumin: 3.6 g/dL (ref 3.5–5.0)
Alkaline Phosphatase: 49 U/L (ref 38–126)
Anion gap: 13 (ref 5–15)
BUN: 14 mg/dL (ref 8–23)
CO2: 25 mmol/L (ref 22–32)
Calcium: 9.3 mg/dL (ref 8.9–10.3)
Chloride: 92 mmol/L — ABNORMAL LOW (ref 98–111)
Creatinine, Ser: 0.47 mg/dL (ref 0.44–1.00)
GFR calc Af Amer: >60 mL/min (ref >60)
GFR calc non Af Amer: >60 mL/min (ref >60)
Glucose, Bld: 123 mg/dL — ABNORMAL HIGH (ref 70–99)
Potassium: 3.5 mmol/L (ref 3.5–5.1)
Sodium: 130 mmol/L — ABNORMAL LOW (ref 135–145)
Total Bilirubin: 0.9 mg/dL (ref 0.3–1.2)
Total Protein: 6.5 g/dL (ref 6.5–8.1)

## 2019-06-15 IMAGING — CT CT HEAD W/O CM
4 series · 16 of 47 positions shown, 18 images · non-contrast
Comparison: [DATE], [DATE]

CLINICAL DATA: Follow-up stroke

EXAM:
CT HEAD WITHOUT CONTRAST
TECHNIQUE: Contiguous axial images were obtained from the base of the skull
through the vertex without intravenous contrast.

[Series 3: head without · axial · non-contrast · 0.40mm/px · z∈[-25,+95]mm · 7 of 33 slices shown, 9 images]
[im 5/33  brain]
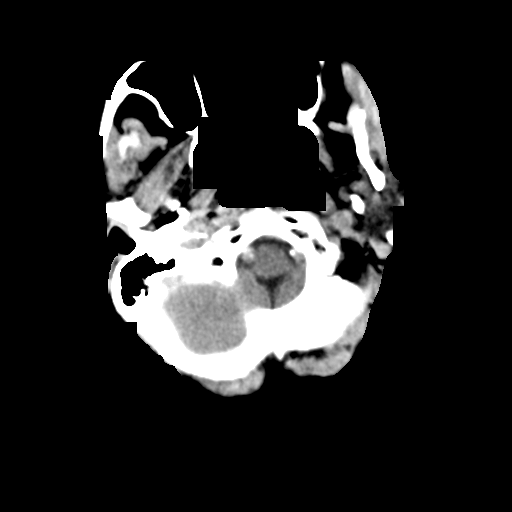
[im 5/33  bone]
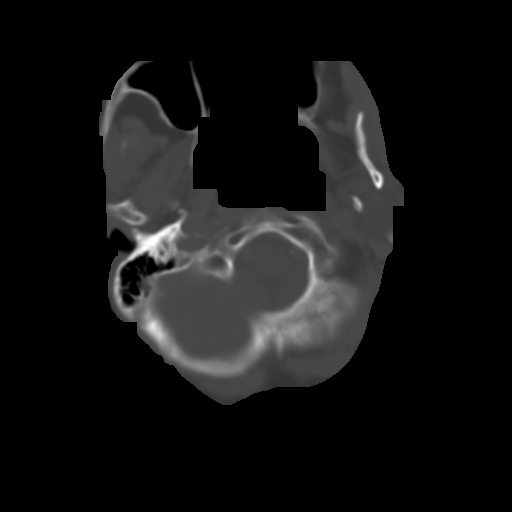
[im 9/33  brain]
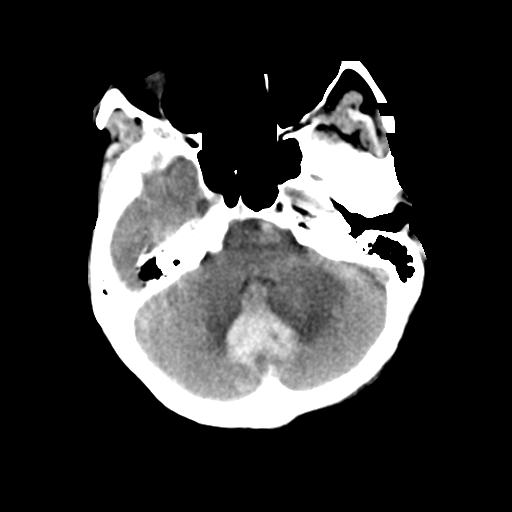
[im 13/33  brain]
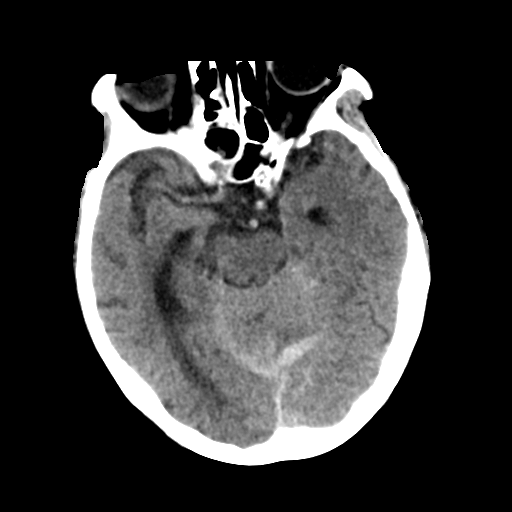
[im 17/33  brain]
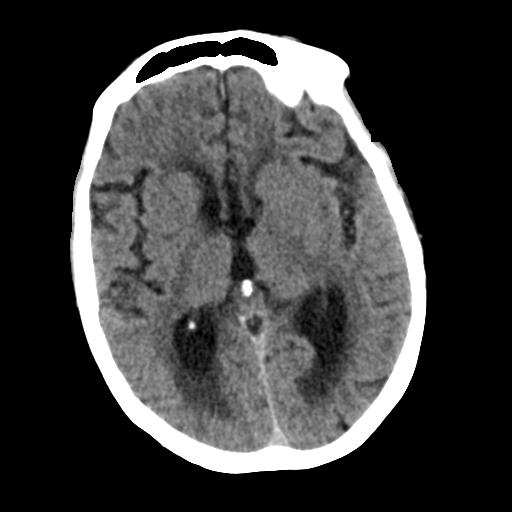
[im 21/33  brain]
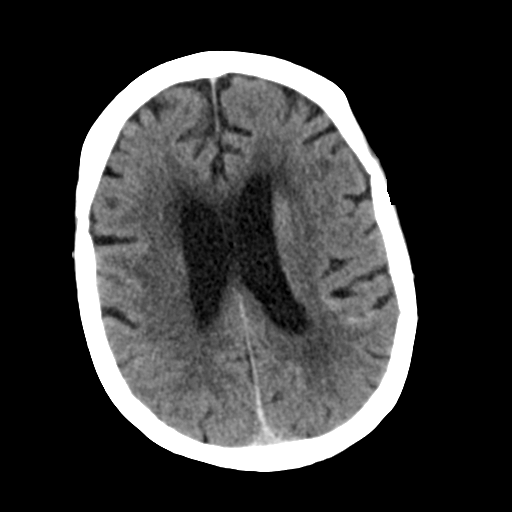
[im 21/33  bone]
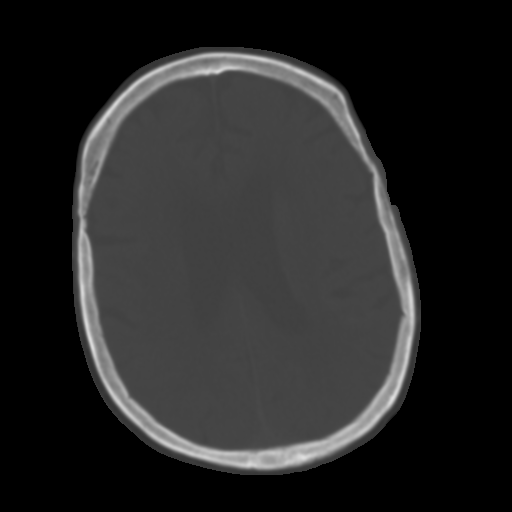
[im 25/33  brain]
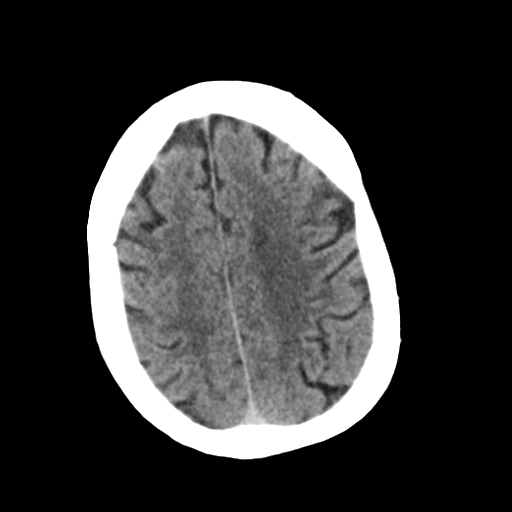
[im 29/33  brain]
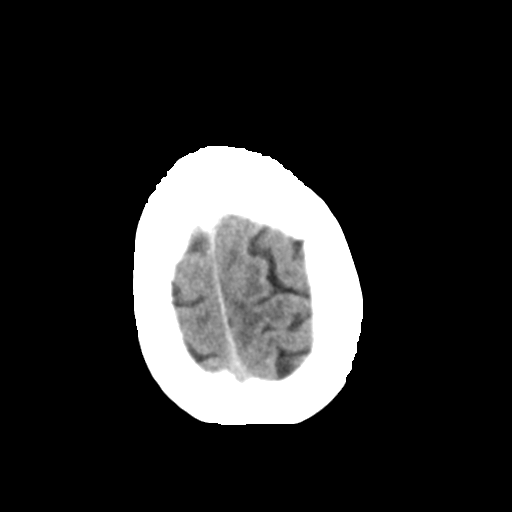

[Series 4: head bone · axial · 0.40mm/px · z∈[-29,+3]mm · 3 of 81 slices shown]
[im 9/81  bone]
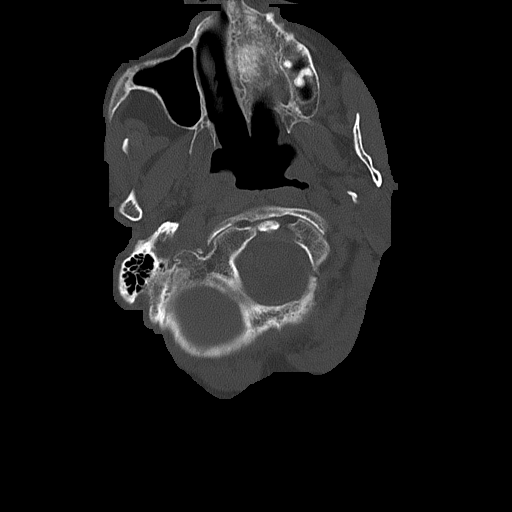
[im 17/81  bone]
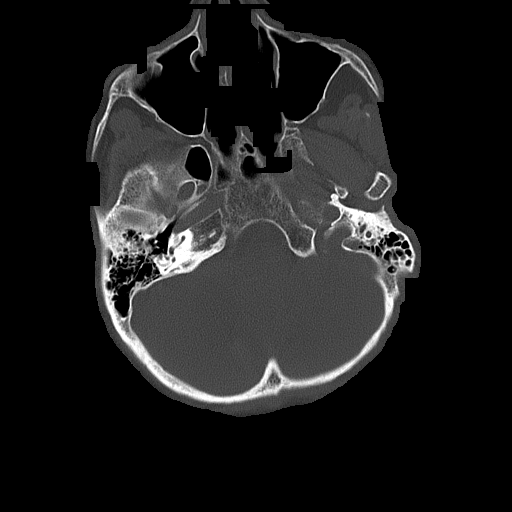
[im 25/81  bone]
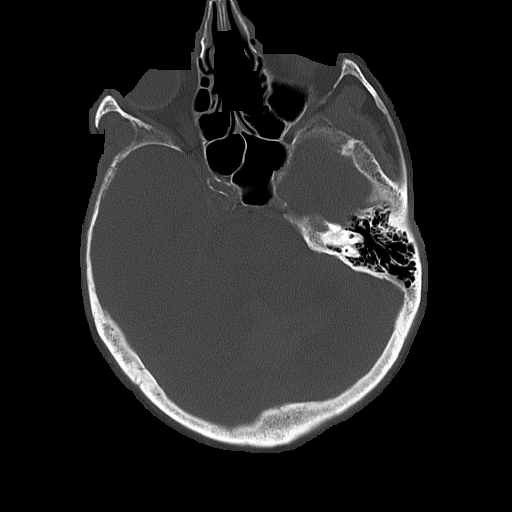

[Series 5: head without cor · coronal · non-contrast · 0.31mm/px · 3 of 67 slices shown]
[im 23/67  brain]
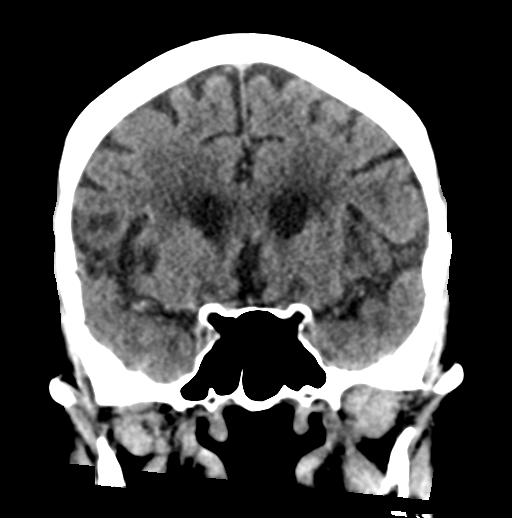
[im 30/67  brain]
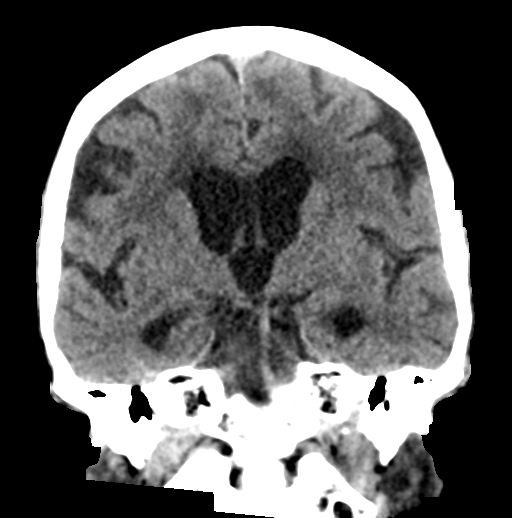
[im 37/67  brain]
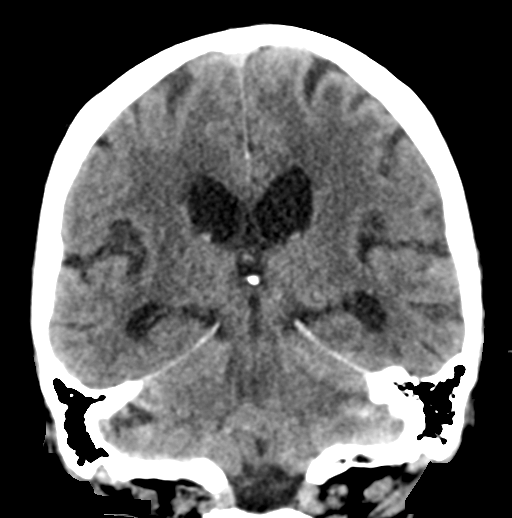

[Series 6: head without sag · sagittal · non-contrast · 0.35mm/px · 3 of 64 slices shown]
[im 24/64  brain]
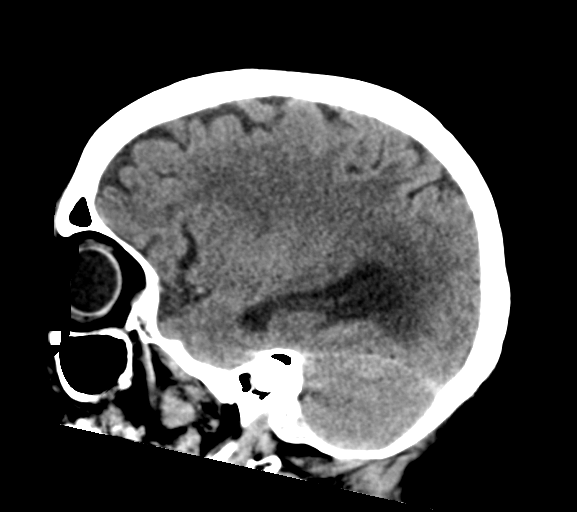
[im 33/64  brain]
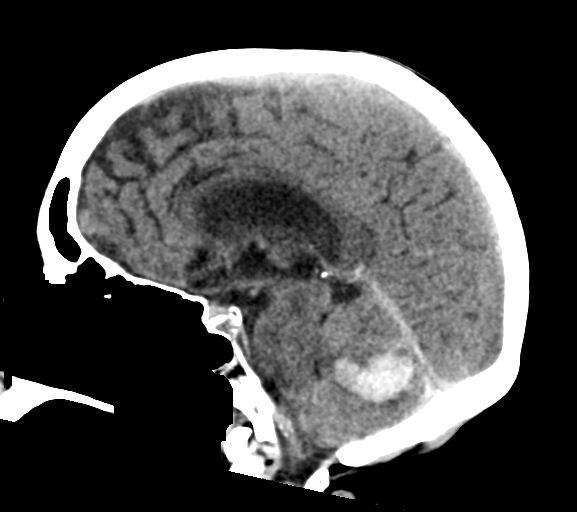
[im 41/64  brain]
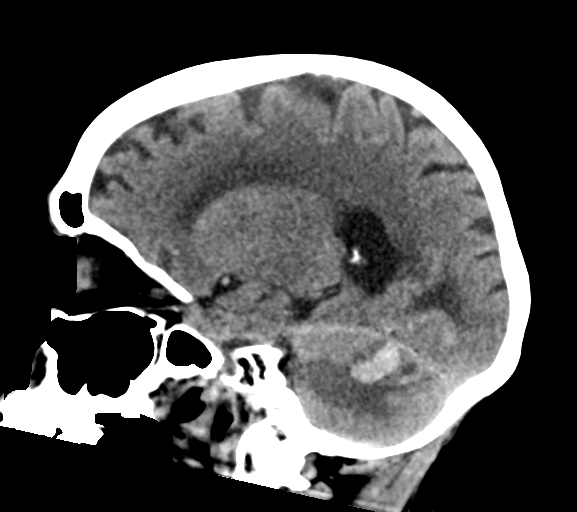

[16 of 47 positions shown; findings below may reference images not displayed]

FINDINGS: Brain: No significant interval change in a large intraparenchymal
hemorrhage of the midline cerebellum with adjacent edema, which
nearly effaces the fourth ventricle. Small volume subarachnoid
hemorrhage in the posterior fossa and about the inferior occipital
lobes. There is no significant interval change in mildly dilated
appearance of the lateral ventricles, however there is perhaps
slight interval increase in periventricular white matter
hypodensity, suggesting transependymal edema. Caliber of the
ventricles is somewhat increased in comparison to initial
presentation CT dated [DATE]. Incidental note of cavum septum
pellucidum et vergae variant of the lateral ventricles.

Vascular: No hyperdense vessel or unexpected calcification.

Skull: Normal. Negative for fracture or focal lesion.

Sinuses/Orbits: No acute finding.

Other: None.
IMPRESSION: 1. No significant interval change in large intraparenchymal
hemorrhage of the midline cerebellum with adjacent edema, which
nearly effaces the fourth ventricle.
2. There is no significant interval change in mildly dilated
appearance of the lateral ventricles, however there is perhaps
slight interval increase in periventricular white matter
hypodensity, suggesting transependymal edema. Caliber of the
ventricles is somewhat increased in comparison to initial
presentation CT dated [DATE]. Findings are concerning for
obstructive hydrocephalus.
3. Small volume subarachnoid hemorrhage in the posterior fossa and
about the inferior occipital lobes, unchanged.

## 2019-06-15 IMAGING — DX DG CHEST 2V
2 series · 2 of 2 positions shown · non-contrast
Comparison: [DATE].  CT chest [DATE].

CLINICAL DATA: Lethargy.

EXAM:
CHEST - 2 VIEW

[chest lat]
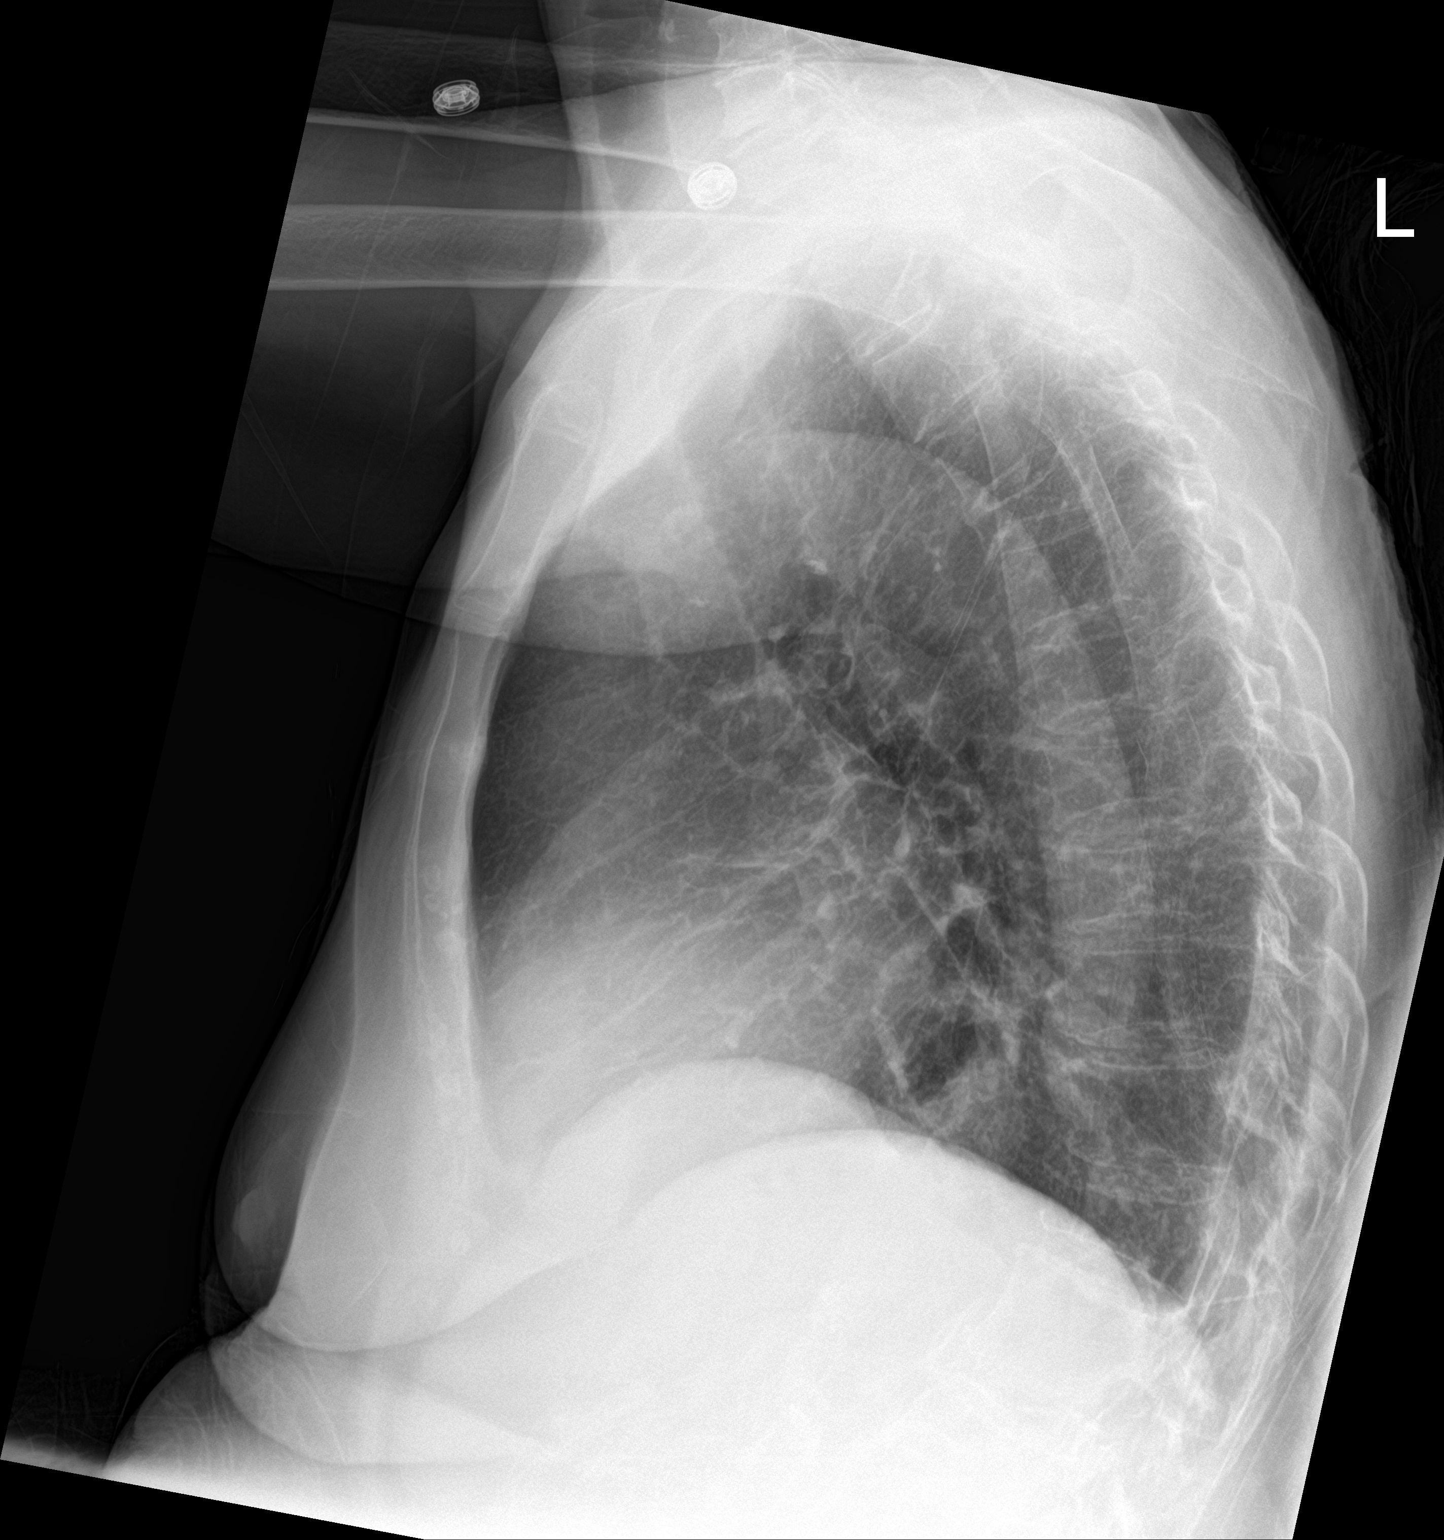

[chest ap]
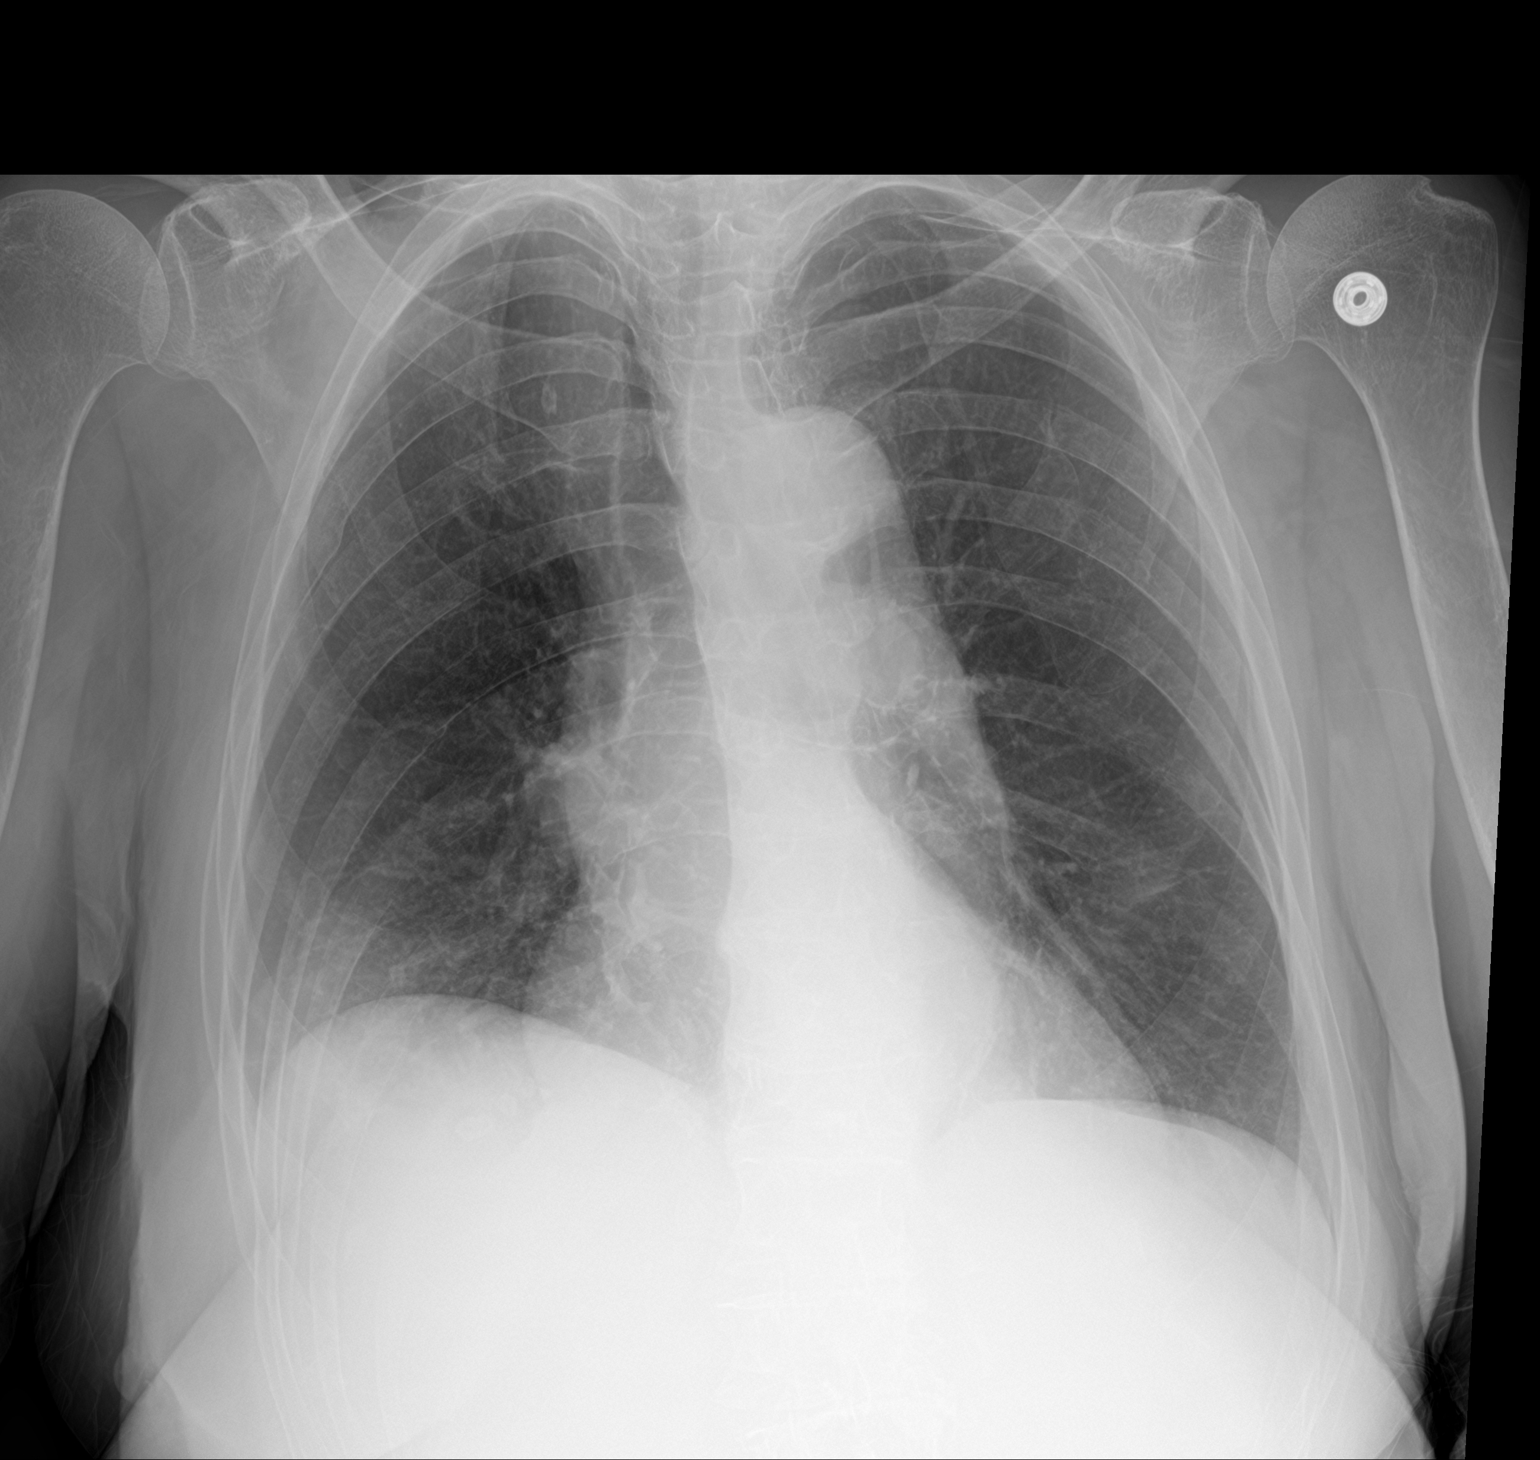

[2 of 2 positions shown; findings below may reference images not displayed]

FINDINGS: Patient is rotated. Trachea is midline. Heart size is accentuated by
AP technique. Scarring in the lingula. Favor superimposition of
shadows in the right costophrenic angle. Lungs are otherwise clear.
No pleural fluid. Old right rib fracture. Mid and lower thoracic and
upper lumbar compression fractures are again noted.
IMPRESSION: No acute findings.

## 2019-06-15 MED ORDER — METHYLPREDNISOLONE SODIUM SUCC 40 MG IJ SOLR
10.0000 mg | Freq: Every day | INTRAMUSCULAR | Status: AC
  Administered 2019-06-20: 08:00:00 10 mg via INTRAVENOUS
  Filled 2019-06-15: qty 0.25

## 2019-06-15 MED ORDER — METHYLPREDNISOLONE SODIUM SUCC 40 MG IJ SOLR
10.0000 mg | Freq: Every day | INTRAMUSCULAR | Status: AC
  Administered 2019-06-21: 10 mg via INTRAVENOUS
  Filled 2019-06-15: qty 0.25

## 2019-06-15 MED ORDER — METHYLPREDNISOLONE SODIUM SUCC 40 MG IJ SOLR
10.0000 mg | Freq: Every day | INTRAMUSCULAR | Status: AC
  Administered 2019-06-22: 10:00:00 10 mg via INTRAVENOUS
  Filled 2019-06-15: qty 0.25

## 2019-06-15 MED ORDER — METHYLPREDNISOLONE 4 MG PO TBPK
8.0000 mg | ORAL_TABLET | Freq: Every morning | ORAL | Status: DC
  Filled 2019-06-15: qty 21

## 2019-06-15 MED ORDER — METHYLPREDNISOLONE 4 MG PO TBPK: 4.0000 mg | ORAL_TABLET | Freq: Three times a day (TID) | ORAL | Status: DC

## 2019-06-15 MED ORDER — METHYLPREDNISOLONE SODIUM SUCC 40 MG IJ SOLR
20.0000 mg | Freq: Every day | INTRAMUSCULAR | Status: AC
  Administered 2019-06-19: 09:00:00 20 mg via INTRAVENOUS
  Filled 2019-06-15: qty 0.5

## 2019-06-15 MED ORDER — METHYLPREDNISOLONE 4 MG PO TBPK: 8.0000 mg | ORAL_TABLET | Freq: Every evening | ORAL | Status: DC

## 2019-06-15 MED ORDER — METHYLPREDNISOLONE 4 MG PO TBPK: 4.0000 mg | ORAL_TABLET | ORAL | Status: DC

## 2019-06-15 MED ORDER — ONDANSETRON HCL 4 MG/2ML IJ SOLN
4.0000 mg | Freq: Four times a day (QID) | INTRAMUSCULAR | Status: DC | PRN
  Administered 2019-06-17: 02:00:00 4 mg via INTRAVENOUS
  Filled 2019-06-15 (×3): qty 2

## 2019-06-15 MED ORDER — METHYLPREDNISOLONE 4 MG PO TBPK: 4.0000 mg | ORAL_TABLET | Freq: Four times a day (QID) | ORAL | Status: DC

## 2019-06-15 MED ORDER — METHYLPREDNISOLONE SODIUM SUCC 40 MG IJ SOLR
30.0000 mg | Freq: Every day | INTRAMUSCULAR | Status: AC
  Administered 2019-06-18: 09:00:00 30 mg via INTRAVENOUS
  Filled 2019-06-15: qty 0.75

## 2019-06-15 MED ORDER — SODIUM CHLORIDE 0.45 % IV SOLN: INTRAVENOUS | Status: DC

## 2019-06-15 MED ORDER — METHYLPREDNISOLONE SODIUM SUCC 125 MG IJ SOLR
40.0000 mg | Freq: Every day | INTRAMUSCULAR | Status: AC
  Administered 2019-06-17: 08:00:00 40 mg via INTRAVENOUS
  Filled 2019-06-15: qty 2

## 2019-06-15 MED ORDER — METOPROLOL SUCCINATE ER 50 MG PO TB24: 50.0000 mg | ORAL_TABLET | Freq: Every day | ORAL | Status: DC

## 2019-06-15 MED ORDER — METHYLPREDNISOLONE SODIUM SUCC 125 MG IJ SOLR
50.0000 mg | Freq: Every day | INTRAMUSCULAR | Status: AC
  Administered 2019-06-16: 08:00:00 50 mg via INTRAVENOUS
  Filled 2019-06-15: qty 2

## 2019-06-15 MED ORDER — METHYLPREDNISOLONE SODIUM SUCC 125 MG IJ SOLR
60.0000 mg | Freq: Every day | INTRAMUSCULAR | Status: AC
  Administered 2019-06-15: 60 mg via INTRAVENOUS
  Filled 2019-06-15: qty 2

## 2019-06-15 NOTE — Evaluation (Signed)
Physical Therapy Assessment and Plan  Patient Details  Name: Tanya Knight MRN: [id number] Date of Birth: 03/24/1941  PT Diagnosis: Abnormal posture, Difficulty walking, Dizziness and giddiness, Muscle weakness, and Vertigo of central origin Rehab Potential: Good ELOS: 2 weeks   Today's Date: 06/15/2019 PT Individual Time: 0902-0942 PT Individual Time Calculation (min): 40 min  and Today's Date: 06/15/2019 PT Missed Time: 20 Minutes Missed Time Reason: Patient fatigue(lethargy, dizziness/nausea)   Problem List:  Patient Active Problem List   Diagnosis Date Noted  . Hyperlipidemia 06/14/2019  . Cerebellar vermis ICH with SDH and SAH, likely hypertensive 06/10/2019  . Hyponatremia 09/08/2014  . Advance directive discussed with patient 09/08/2014  . Routine general medical examination at a health care facility 09/03/2011  . Essential hypertension, benign 10/12/2006  . GERD 10/12/2006  . Osteoporosis 10/12/2006  . Seizure disorder ([place]) 10/12/2006    Past Medical History:  Past Medical History:  Diagnosis Date  . Fracture of metatarsal    Repair of fractured R metatarsal --Dr Duda---4/08  . GERD (gastroesophageal reflux disease)   . Hypertension   . Melanoma in situ [facility]) 7/17   Dr Kellie Moor  . Osteoporosis   . Seizure disorder ([place])   . Seizures ([place])   . Vitamin D deficiency    Past Surgical History:  Past Surgical History:  Procedure Laterality Date  . CATARACT EXTRACTION, BILATERAL Bilateral 2014  . EYE SURGERY     Obstructed tear duct  . FOOT SURGERY  2008  . MELANOMA EXCISION Left 11/2015   left lower leg  . TEAR DUCT PROBING  10/12   temporary stent  . TONSILLECTOMY AND ADENOIDECTOMY  1948    Assessment & Plan Clinical Impression: Patient is a 79 y.o. year old female with history of hypertension, seizure disorder maintained on Depakote.  Per chart review patient lives with spouse.  Independent prior to admission.  1 level home with 3 steps to entry.   Presented 06/10/2019 with headache, nausea/vomiting, weakness and slurred speech.  Cranial CT scan showed a 16 cc hematoma centered in the cerebellar vermis with mild subdural and subarachnoid extension.  No hydrocephalus.  Blood pressure 945/038 with systolic increasing into the 160s and started on Cleviprex.  Patient had been on aspirin prior to admission and held due to hematoma.  Admission chemistry sodium 134, potassium 3.2, urine drug screen negative, WBC 11,900.  CT angiogram showed no posterior fossa aneurysm or AVM identified.  Follow-up CT/MRI showed no evidence of additional  cerebellar hemorrhage.  Echocardiogram with ejection fraction of 60% without emboli.  Neurology follow-up close monitoring of blood pressure.  Subcutaneous heparin for DVT prophylaxis initiated 06/11/2019.  Patient remained on Depakote his prior to admission.  Tolerating a regular diet. Therapy evaluations completed and patient was admitted for a comprehensive rehab program.   Patient currently requires  mod-max  with mobility secondary to muscle weakness, impaired timing and sequencing, decreased attention, decreased safety awareness, and delayed processing, central origin, and decreased sitting balance, decreased standing balance, and decreased postural control.  Prior to hospitalization, patient was independent  with mobility and lived with Spouse in a House home.  Home access is 3(per chart review, need to confirm with husband) .  Patient will benefit from skilled PT intervention to maximize safe functional mobility, minimize fall risk, and decrease caregiver burden for planned discharge home with 24 hour supervision.  Anticipate patient will benefit from follow up [place] at discharge.     Skilled Therapeutic Intervention Evaluation completed (see details  above and below) with education on PT POC and goals and individual treatment initiated with focus on  Pt supine in bed upon PT arrival, agreeable to PT examination,  reports dizziness and nausea. Oriented to person, place, and situation requires max frequency of stimulation to keep attention 2/2 significant lethargy. Pt able to recall home environment but gave information inconsistent per chart requiring clarification from husband. Pt able to follow commands performs finger to nose bilaterally, very slow to initiate movement in BLE & BUE requiring additional time for task, unable to formally assess strength d/t pt's lethargy. Therapist provided modA for rolling R/L in bed and supine<>sitting EOB for LE placement, sequencing and assist movement. Pt's blood pressure was elevated to 179/90 mmHg in supine and decreased to 164/87 mmHg once sitting EOB. Pt becomes nauseas with positional movements but did not vomit. PT examination limited d/t pt's significant lethargy. Pt left supine in bed with needs in reach and bed alarm set.       PT Evaluation Precautions/Restrictions Precautions Precautions: Fall Precaution Comments: high BP, dizziness Restrictions Weight Bearing Restrictions: No General   Vital SignsTherapy Vitals Pulse Rate: (!) 58 BP: (!) 164/87 Patient Position (if appropriate): Sitting Pain Pain Assessment Pain Scale: 0-10 Pain Score: 6  Pain Type: Acute pain Pain Location: Head Pain Orientation: Anterior Pain Descriptors / Indicators: Headache Pain Onset: On-going Pain Intervention(s): Medication (See eMAR) Home Living/Prior Functioning Home Living Available Help at Discharge: Family;Available 24 hours/day Type of Home: House Home Access: Stairs to enter [employer] of Steps: 3(per chart review, need to confirm with husband) Entrance Stairs-Rails: Right;Left Home Layout: One level Bathroom Shower/Tub: Chiropodist: Standard Additional Comments: has a w/c and walker at home  Lives With: Spouse Prior Function Level of Independence: Independent with basic ADLs;Independent with gait Driving: Yes Vocation:  Retired Leisure: Hobbies-yes (Comment) Comments: go for walks, regular exercise unable to understand type, has 1 grandchild Cognition Overall Cognitive Status: No family/caregiver present to determine baseline cognitive functioning Arousal/Alertness: Lethargic Orientation Level: Oriented to situation;Oriented to place;Oriented to person Sustained Attention: (difficult to assess due to lethargy) Memory: Impaired Memory Impairment: Decreased short term memory;Storage deficit;Retrieval deficit Decreased Short Term Memory: Verbal basic Awareness: Impaired Awareness Impairment: Intellectual impairment;Emergent impairment Executive Function: Self Monitoring;Reasoning Reasoning: Impaired Reasoning Impairment: Verbal basic Self Monitoring: Impaired Self Monitoring Impairment: Verbal basic;Functional basic Safety/Judgment: Appears intact Sensation Sensation Light Touch: Appears Intact(pt nodded head when asked if she could feel therapist touching her, unable to verablize location but pointed to leg being touched) Coordination Gross Motor Movements are Fluid and Coordinated: No Fine Motor Movements are Fluid and Coordinated: No Coordination and Movement Description: very slow to initiate movement, requires additional time Finger Nose Finger Test: able to perform bilaterally, very slow to movement Motor  Motor Motor: Other (comment);Abnormal postural alignment and control Motor - Skilled Clinical Observations: pt able to initiate movement of BLE and BUE (shoulder flexion to 90*, grip therapist fingers & push away/pull therapist towards lightly); dec truncal control;  Mobility Bed Mobility Bed Mobility: Rolling Right;Rolling Left;Supine to Sit;Sit to Supine Rolling Right: Moderate Assistance - Patient 50-74% Rolling Left: Moderate Assistance - Patient 50-74% Supine to Sit: Moderate Assistance - Patient 50-74% Sit to Supine: Moderate Assistance - Patient 50-74% Locomotion  Gait Ambulation:  No(unable to assess d/t significant lethargy) Gait Gait: No Stairs / Additional Locomotion Stairs: No Wheelchair Mobility Wheelchair Mobility: No  Trunk/Postural Assessment  Cervical Assessment Cervical Assessment: Exceptions to [facility] Thoracic Assessment Thoracic Assessment: Exceptions to [facility]  Lumbar Assessment Lumbar Assessment: Exceptions to [facility] Postural Control Postural Control: Deficits on evaluation Trunk Control: unable to maintain sitting balance, posterolateral lean to R may be d/t unlevel pelvis 2/2 hospital bed Postural Limitations: forward head, rounded shoulders, kyphotic  Balance Balance Balance Assessed: Yes Static Sitting Balance Static Sitting - Level of Assistance: 3: Mod assist;4: Min assist Dynamic Sitting Balance Dynamic Sitting - Level of Assistance: 2: Max assist Sitting balance - Comments: posterolateral leaning initially, when corrected able to maintain balance with minA Static Standing Balance Static Standing - Level of Assistance: Not tested (comment)(unable to assess standing balance secondary to lethargy) Dynamic Standing Balance Dynamic Standing - Level of Assistance: Not tested (comment) Extremity Assessment  RLE Assessment RLE Assessment: Exceptions to [facility] Passive Range of Motion (PROM) Comments: WFL for hip flexion, knee flexion/ext General Strength Comments: muscle activation but requires assist for movement likely d/t significant lethargy; able to initiate hip flexion/knee flexion/and wiggle toes LLE Assessment LLE Assessment: Exceptions to [facility] Passive Range of Motion (PROM) Comments: WFL for hip flexion, knee flexion/ext General Strength Comments: muscle activation but requires assist for movement likely d/t significant lethargy; able to initiate hip flexion/knee flexion/and wiggle toes    Refer to Care Plan for Long Term Goals  Recommendations for other services: None   Discharge Criteria: Patient will be discharged from PT if patient  refuses treatment 3 consecutive times without medical reason, if treatment goals not met, if there is a change in medical status, if patient makes no progress towards goals or if patient is discharged from hospital.  The above assessment, treatment plan, treatment alternatives and goals were discussed and mutually agreed upon: by patient  [name] SPT 06/15/2019, 7:40 AM

## 2019-06-15 NOTE — Progress Notes (Addendum)
Patient's BP was 187/87. Rechecked manually as 181/92. Patient lethargic, slow to respond, but oriented to person, place, situation. On-call provider notified. No new orders. Will continue to monitor.

## 2019-06-15 NOTE — Progress Notes (Signed)
Cranial CT scan completed showing no acute changes and reviewed by neurology services Dr. Lucious Groves.  Recommendations were for a Medrol dose pack.  Left message with son concerning findings of CT scan and await further follow-up by neurology services.  Initiated IV fluids to maintain hydration for now and monitor closely.

## 2019-06-15 NOTE — Evaluation (Signed)
Speech Language Pathology Assessment and Plan  Patient Details  Name: Tanya Knight MRN: [id number] Date of Birth: [birth year]  SLP Diagnosis: Dysarthria;Cognitive Impairments  Rehab Potential: Good ELOS: 2 weeks    Today's Date: 06/15/2019 SLP Individual Time: 2778-2423 SLP Individual Time Calculation (min): 45 min   Problem List:  Patient Active Problem List   Diagnosis Date Noted  . Hyperlipidemia 06/14/2019  . Cerebellar vermis ICH with SDH and SAH, likely hypertensive 06/10/2019  . Hyponatremia 09/08/2014  . Advance directive discussed with patient 09/08/2014  . Routine general medical examination at a health care facility 09/03/2011  . Essential hypertension, benign 10/12/2006  . GERD 10/12/2006  . Osteoporosis 10/12/2006  . Seizure disorder ([place]) 10/12/2006   Past Medical History:  Past Medical History:  Diagnosis Date  . Fracture of metatarsal    Repair of fractured R metatarsal --Dr Duda---4/08  . GERD (gastroesophageal reflux disease)   . Hypertension   . Melanoma in situ [facility]) 7/17   Dr Kellie Moor  . Osteoporosis   . Seizure disorder ([place])   . Seizures ([place])   . Vitamin D deficiency    Past Surgical History:  Past Surgical History:  Procedure Laterality Date  . CATARACT EXTRACTION, BILATERAL Bilateral 2014  . EYE SURGERY     Obstructed tear duct  . FOOT SURGERY  2008  . MELANOMA EXCISION Left 11/2015   left lower leg  . TEAR DUCT PROBING  10/12   temporary stent  . TONSILLECTOMY AND ADENOIDECTOMY  1948    Assessment / Plan / Recommendation Clinical Impression   HPI: Tanya Knight is a 79 year old right-handed female with history of hypertension, seizure disorder maintained on Depakote.  Per chart review patient lives with spouse.  Independent prior to admission.  1 level home with 3 steps to entry.  Presented 06/10/2019 with headache, nausea/vomiting, weakness and slurred speech.  Cranial CT scan showed a 16 cc hematoma centered in the cerebellar  vermis with mild subdural and subarachnoid extension.  No hydrocephalus.  Blood pressure 536/144 with systolic increasing into the 160s and started on Cleviprex.  Patient had been on aspirin prior to admission and held due to hematoma.  Admission chemistry sodium 134, potassium 3.2, urine drug screen negative, WBC 11,900.  CT angiogram showed no posterior fossa aneurysm or AVM identified.  Follow-up CT/MRI showed no evidence of additional  cerebellar hemorrhage.  Echocardiogram with ejection fraction of 60% without emboli.  Neurology follow-up close monitoring of blood pressure.  Subcutaneous heparin for DVT prophylaxis initiated 06/11/2019.  Patient remained on Depakote his prior to admission.  Tolerating a regular diet.  Therapy evaluations completed and patient was admitted for a comprehensive rehab program 06/14/19 and SLP evaluation was completed 06/15/19 with results as follows:  Pt was limited by significant lethargy this morning, and no family was present to confirm baseline speech-language and/or cognitive function, however per chart review, pt was independent prior to admission and now presents with dysarthria (~50% intelligible phrase level), and cognitive impairments with a moderate functional impact. Pt was oriented X4, however unable to identify any physical or cognitive deficits post-CVA. She received a 0/10 on the Cerebellar Cognitive Affective/Schmahmann Syndrome Scale (CCAS-Scale) Version 1A indicative of "definite CCAS" according to standardized test score. Significantly delayed processing also noted. Pt's very low vocal intensity and imprecise movement of articulations which is heavily influencing speech intelligibility. Suspect degree of lethargy also further reducing intelligibility. Pt did not recall any clear speech strategies.   Pt would benefit from skilled  ST services to address dysarthria and cognitive impairments detailed above in order to maximize safety, functional independence and  communication at d/c.   Skilled Therapeutic Interventions          Cognitive-linguistic evaluation was administered and results were reviewed with pt (see above for details). Max A verbal cues also provided for use of speech intelligibility strategies (increased vocal intensity and overarticulation) during word and phrase level speech tasks.    SLP Assessment  Patient will need skilled [facility] during CIR admission    Recommendations  Oral Care Recommendations: Oral care BID Patient destination: Home Follow up Recommendations: Home Health SLP;24 hour supervision/assistance Equipment Recommended: None recommended by SLP    SLP Frequency 3 to 5 out of 7 days   SLP Duration  SLP Intensity  SLP Treatment/Interventions 2 weeks  Minumum of 1-2 x/day, 30 to 90 minutes  Cognitive remediation/compensation;Cueing hierarchy;Functional tasks;Patient/family education;Speech/Language facilitation;Internal/external aids    Pain Pain Assessment Pain Scale: Faces Pain Score: 0-No pain Faces Pain Scale: No hurt  Prior Functioning Type of Home: House  Lives With: Spouse Available Help at Discharge: Family;Available 24 hours/day Vocation: Retired  Programmer, systems Overall Cognitive Status: No family/caregiver present to determine baseline cognitive functioning Arousal/Alertness: Lethargic Orientation Level: Oriented to situation;Oriented to place;Oriented to person Sustained Attention: (difficult to assess due to lethargy) Memory: Impaired Memory Impairment: Decreased short term memory;Storage deficit;Retrieval deficit Decreased Short Term Memory: Verbal basic Awareness: Impaired Awareness Impairment: Intellectual impairment;Emergent impairment Executive Function: Self Monitoring;Reasoning Reasoning: Impaired Reasoning Impairment: Verbal basic Self Monitoring: Impaired Self Monitoring Impairment: Verbal basic;Functional basic Safety/Judgment: Appears  intact  Comprehension Auditory Comprehension Overall Auditory Comprehension: Appears within functional limits for tasks assessed Commands: Within Functional Limits Conversation: Simple EffectiveTechniques: Extra processing time;Increased volume Visual Recognition/Discrimination Discrimination: Not tested Reading Comprehension Reading Status: Not tested Expression Expression Primary Mode of Expression: Verbal Verbal Expression Overall Verbal Expression: Appears within functional limits for tasks assessed Initiation: Impaired Repetition: No impairment Naming: No impairment Pragmatics: No impairment Non-Verbal Means of Communication: Not applicable Written Expression Written Expression: Not tested Oral Motor Oral Motor/Sensory Function Overall Oral Motor/Sensory Function: Within functional limits Motor Speech Overall Motor Speech: Impaired Respiration: Within functional limits Phonation: Low vocal intensity Resonance: Within functional limits Articulation: Impaired Level of Impairment: Sentence Intelligibility: Intelligibility reduced Word: 25-49% accurate Phrase: 25-49% accurate Sentence: 25-49% accurate Conversation: 25-49% accurate Motor Planning: Witnin functional limits Motor Speech Errors: Not applicable Effective Techniques: Increased vocal intensity;Over-articulate   Intelligibility: Intelligibility reduced Word: 25-49% accurate Phrase: 25-49% accurate Sentence: 25-49% accurate Conversation: 25-49% accurate  Short Term Goals: Week 1: SLP Short Term Goal 1 (Week 1): Pt will detect and correct errors during functionla tasks with Min A verbal/visual cues. SLP Short Term Goal 2 (Week 1): Pt will recall new and daily information with Min A verbal/visual cues for use of compensatory strategies. SLP Short Term Goal 3 (Week 1): Pt will utilize speech intelligibility strategies (specifically increased vocal intensity and overarticulation) with Min A verbal/visual cues  to achieve ~80% intelligibility at the sentence level.  Refer to Care Plan for Long Term Goals  Recommendations for other services: None   Discharge Criteria: Patient will be discharged from SLP if patient refuses treatment 3 consecutive times without medical reason, if treatment goals not met, if there is a change in medical status, if patient makes no progress towards goals or if patient is discharged from hospital.  The above assessment, treatment plan, treatment alternatives and goals were discussed and mutually agreed  upon: by patient  Arbutus Leas 06/15/2019, 10:29 AM

## 2019-06-15 NOTE — Progress Notes (Addendum)
Patient's BP elevated 182/75 this AM. Retaken manually at 05:45, 177/76. Linna Hoff, [place] notified. Pharmacy consulted, daily Toprol-XL 50mg  order time changed from 16:00 to 08:00. Patient shows no acute signs of distress.

## 2019-06-15 NOTE — Progress Notes (Signed)
Inpatient Rehabilitation  Patient information reviewed and entered into eRehab system by Amanda Steuart M. Sila Sarsfield, M.A., CCC/SLP, PPS Coordinator.  Information including medical coding, functional ability and quality indicators will be reviewed and updated through discharge.    

## 2019-06-15 NOTE — Plan of Care (Signed)
  Problem: Consults Goal: RH STROKE PATIENT EDUCATION Description: See Patient Education module for education specifics  06/15/2019 0933 by Amanda Cockayne, LPN Outcome: Progressing 06/14/2019 1934 by Amanda Cockayne, LPN Outcome: Progressing   Problem: RH BOWEL ELIMINATION Goal: RH STG MANAGE BOWEL WITH ASSISTANCE Description: STG Manage Bowel with min Assistance. 06/15/2019 0933 by Amanda Cockayne, LPN Outcome: Progressing 06/14/2019 1934 by Amanda Cockayne, LPN Outcome: Progressing Goal: RH STG MANAGE BOWEL W/MEDICATION W/ASSISTANCE Description: STG Manage Bowel with Medication with min Assistance. 06/15/2019 0933 by Amanda Cockayne, LPN Outcome: Progressing 06/14/2019 1934 by Amanda Cockayne, LPN Outcome: Progressing   Problem: RH BLADDER ELIMINATION Goal: RH STG MANAGE BLADDER WITH ASSISTANCE Description: STG Manage Bladder With min Assistance 06/15/2019 0933 by Amanda Cockayne, LPN Outcome: Progressing 06/14/2019 1934 by Amanda Cockayne, LPN Outcome: Progressing   Problem: RH SKIN INTEGRITY Goal: RH STG SKIN FREE OF INFECTION/BREAKDOWN Description: Patients skin will remain free from further infection or breakdown with min assist. 06/15/2019 0933 by Amanda Cockayne, LPN Outcome: Progressing 06/14/2019 1934 by Amanda Cockayne, LPN Outcome: Progressing Goal: RH STG MAINTAIN SKIN INTEGRITY WITH ASSISTANCE Description: STG Maintain Skin Integrity With min Assistance. 06/15/2019 0933 by Amanda Cockayne, LPN Outcome: Progressing 06/14/2019 1934 by Amanda Cockayne, LPN Outcome: Progressing Goal: RH STG ABLE TO PERFORM INCISION/WOUND CARE W/ASSISTANCE Description: STG Able To Perform Incision/Wound Care With min/mod Assistance. 06/15/2019 0933 by Amanda Cockayne, LPN Outcome: Progressing 06/14/2019 1934 by Amanda Cockayne, LPN Outcome: Progressing   Problem: RH SAFETY Goal: RH STG ADHERE TO SAFETY PRECAUTIONS W/ASSISTANCE/DEVICE Description: STG Adhere to Safety  Precautions With min Assistance/Device. 06/15/2019 0933 by Amanda Cockayne, LPN Outcome: Progressing 06/14/2019 1934 by Amanda Cockayne, LPN Outcome: Progressing   Problem: RH KNOWLEDGE DEFICIT Goal: RH STG INCREASE KNOWLEDGE OF HYPERTENSION Description: Patient/caregiver will verbalize understanding of management of HTN including diet, exercise, medications, monitoring, and follow up care with min assist. 06/15/2019 0933 by Amanda Cockayne, LPN Outcome: Progressing 06/14/2019 1934 by Amanda Cockayne, LPN Outcome: Progressing Goal: RH STG INCREASE KNOWLEGDE OF HYPERLIPIDEMIA Description: Patient/caregiver will verbalize understanding of management of HLD including diet, exercise, medications, monitoring, and follow up care with min assist. 06/15/2019 0933 by Amanda Cockayne, LPN Outcome: Progressing 06/14/2019 1934 by Amanda Cockayne, LPN Outcome: Progressing Goal: RH STG INCREASE KNOWLEDGE OF STROKE PROPHYLAXIS Description: Patient/caregiver will verbalize understanding of management of stroke prophylaxis including diet, exercise, medications, monitoring, and follow up care with min assist. 06/15/2019 0933 by Amanda Cockayne, LPN Outcome: Progressing 06/14/2019 1934 by Amanda Cockayne, LPN Outcome: Progressing

## 2019-06-15 NOTE — Evaluation (Signed)
Occupational Therapy Assessment and Plan  Patient Details  Name: Tanya Knight MRN: [id number] Date of Birth: 1940/07/03  OT Diagnosis: abnormal posture, cognitive deficits, disturbance of vision and muscle weakness (generalized) Rehab Potential: Rehab Potential (ACUTE ONLY): Excellent ELOS: 12-14 days   Today's Date: 06/15/2019 OT Individual Time: 0350-0938 OT Individual Time Calculation (min): 47 min     Problem List:  Patient Active Problem List   Diagnosis Date Noted  . Hyperlipidemia 06/14/2019  . Cerebellar vermis ICH with SDH and SAH, likely hypertensive 06/10/2019  . Hyponatremia 09/08/2014  . Advance directive discussed with patient 09/08/2014  . Routine general medical examination at a health care facility 09/03/2011  . Essential hypertension, benign 10/12/2006  . GERD 10/12/2006  . Osteoporosis 10/12/2006  . Seizure disorder ([place]) 10/12/2006    Past Medical History:  Past Medical History:  Diagnosis Date  . Fracture of metatarsal    Repair of fractured R metatarsal --Dr Duda---4/08  . GERD (gastroesophageal reflux disease)   . Hypertension   . Melanoma in situ [facility]) 7/17   Dr Kellie Moor  . Osteoporosis   . Seizure disorder ([place])   . Seizures ([place])   . Vitamin D deficiency    Past Surgical History:  Past Surgical History:  Procedure Laterality Date  . CATARACT EXTRACTION, BILATERAL Bilateral 2014  . EYE SURGERY     Obstructed tear duct  . FOOT SURGERY  2008  . MELANOMA EXCISION Left 11/2015   left lower leg  . TEAR DUCT PROBING  10/12   temporary stent  . TONSILLECTOMY AND ADENOIDECTOMY  1948    Assessment & Plan Clinical Impression: Patient is a 79 y.o. year old female with recent admission to the hospital on.  Patient transferred to CIR on 06/14/2019 .    Patient currently requires total with basic self-care skills secondary to muscle weakness, impaired timing and sequencing and decreased coordination, decreased visual motor skills, decreased  initiation, decreased attention and decreased problem solving and decreased sitting balance, decreased standing balance, decreased postural control, hemiplegia and decreased balance strategies.  Prior to hospitalization, patient could complete ADLs with independent .  Patient will benefit from skilled intervention to decrease level of assist with basic self-care skills and increase independence with basic self-care skills prior to discharge home with care partner.  Anticipate patient will require 24 hour supervision and follow up home health.  OT - End of Session Activity Tolerance: Improving Endurance Deficit: Yes Endurance Deficit Description: Pt unable to tolerate full OT session secondary to lethargy OT Assessment Rehab Potential (ACUTE ONLY): Excellent OT Patient demonstrates impairments in the following area(s): Balance;Cognition;Endurance;Motor;Vision;Safety;Pain OT Basic ADL's Functional Problem(s): Eating;Grooming;Bathing;Dressing;Toileting OT Transfers Functional Problem(s): Toilet;Tub/Shower OT Additional Impairment(s): None OT Plan OT Intensity: Minimum of 1-2 x/day, 45 to 90 minutes OT Frequency: 5 out of 7 days OT Duration/Estimated Length of Stay: 12-14 days OT Treatment/Interventions: Balance/vestibular training;Discharge planning;Pain management;Self Care/advanced ADL retraining;Therapeutic Activities;UE/LE Coordination activities;Therapeutic Exercise;Patient/family education;Functional mobility training;Cognitive remediation/compensation;Disease mangement/prevention;Community reintegration;DME/adaptive equipment instruction;Neuromuscular re-education;UE/LE Strength taining/ROM;Wheelchair propulsion/positioning OT Self Feeding Anticipated Outcome(s): modified independent OT Basic Self-Care Anticipated Outcome(s): min contact guard OT Toileting Anticipated Outcome(s): min contact guard OT Bathroom Transfers Anticipated Outcome(s): min contact guard OT Recommendation Patient  destination: Home Follow Up Recommendations: Home health OT;24 hour supervision/assistance Equipment Recommended: To be determined   Skilled Therapeutic Intervention Pt lethargic with eyes closed and in the bed to start the session.  When aroused, she would momentarily open her eyes partially and then close them.  She was able to  follow some simple manual muscle testing commands such as squeezing the therapist's hands or pulling against therapist while in supine.  BP taken in supine at 168/82.  She nodded her head when asked if she was dizzy in bed but could not rate.  Total assist was needed for supine to sit EOB on the right side.  Mod to max assist for sitting balance while working on selfcare tasks.  She needed washcloth place in her hand but then she was able to wash her face some and some of her arms with increased time and step by step cueing.  Eyes remained mostly closed throughout task with increased LOB to the right.  Pt nodded her head and mouthed "yes" when asked if she was more dizzy sitting up compared to laying down.  Attempted sit to stand with pt needing total assist for partial stand to scoot up to the top of the bed.  Total assist for return to supine to complete session.  Pt left with nursing in room to start IV in order to give pt fluids  OT Evaluation Precautions/Restrictions  Precautions Precautions: Fall Precaution Comments: high BP, dizziness   Vital Signs Therapy Vitals Temp: 97.9 F (36.6 C) Pulse Rate: (!) 55 Resp: 19 BP: (!) 172/74 Patient Position (if appropriate): Lying Oxygen Therapy SpO2: 97 % O2 Device: Room Air Pain Pain Assessment Pain Scale: Faces Pain Score: 0-No pain Home Living/Prior Functioning Home Living Available Help at Discharge: Family, Available 24 hours/day Type of Home: House Home Access: Stairs to enter [employer] of Steps: 3 Entrance Stairs-Rails: Right, Left Home Layout: One level Bathroom Shower/Tub: Scientist, forensic: Standard Additional Comments: has a w/c and walker at home  Lives With: Spouse IADL History Current License: Yes Occupation: Retired Prior Function Level of Independence: Independent with basic ADLs, Independent with gait Driving: Yes Vocation: Retired Leisure: Hobbies-yes (Comment) Comments: go for walks, regular exercise unable to understand type, has 1 grandchild ADL ADL Grooming: Moderate assistance(secondary to lethargy) Where Assessed-Grooming: Edge of bed Upper Body Bathing: Moderate assistance Where Assessed-Upper Body Bathing: Edge of bed Lower Body Bathing: Dependent Where Assessed-Lower Body Bathing: Edge of bed Upper Body Dressing: Dependent Where Assessed-Upper Body Dressing: Edge of bed Lower Body Dressing: Dependent Where Assessed-Lower Body Dressing: Edge of bed Toileting: Dependent Where Assessed-Toileting: Bedside Commode Vision Baseline Vision/History: No visual deficits Vision Assessment?: Vision impaired- to be further tested in functional context(Unable to test visiion secondary to being unable to maintain eyes open) Perception  Perception: Within Functional Limits Praxis Praxis: Intact Cognition Overall Cognitive Status: No family/caregiver present to determine baseline cognitive functioning Arousal/Alertness: Lethargic Memory: Impaired Memory Impairment: Decreased short term memory;Storage deficit;Retrieval deficit Decreased Short Term Memory: Verbal basic Immediate Memory Recall: (unable to test secondary to lethargy) Attention: Focused Focused Attention: Impaired Sustained Attention: Impaired Awareness: Impaired Awareness Impairment: Intellectual impairment;Emergent impairment Executive Function: Self Monitoring;Reasoning Reasoning: Impaired Reasoning Impairment: Verbal basic Self Monitoring: Impaired Self Monitoring Impairment: Verbal basic;Functional basic Safety/Judgment: Appears intact Comments: Unable to get  accurate picture of cognition secondary to lethargy. Sensation Sensation Light Touch: Appears Intact Additional Comments: Pt nodded head yes when therapist touched her hands and asked if she could feel it.  Will look at further when pt is more alert and able to participate. Coordination Gross Motor Movements are Fluid and Coordinated: No Fine Motor Movements are Fluid and Coordinated: No Motor  Motor Motor: Abnormal postural alignment and control Motor - Skilled Clinical Observations: Unble to assess accurately secondary to lethargy  but pt able to wash her face and wash both of her arms with increased time. Mobility  Bed Mobility Bed Mobility: Rolling Right;Supine to Sit;Sit to Supine Rolling Right: Total Assistance - Patient < 25% Supine to Sit: Total Assistance - Patient < 25% Transfers Sit to Stand: Total Assistance - Patient < 25%  Trunk/Postural Assessment  Cervical Assessment Cervical Assessment: Exceptions to WFL(cervical flexion in sitting with slight forward head) Thoracic Assessment Thoracic Assessment: Exceptions to WFL(thoracic rounding) Lumbar Assessment Lumbar Assessment: Exceptions to WFL(slight posterior pelvic tilt in sitting) Postural Control Postural Control: Deficits on evaluation Trunk Control: LOB to the right during EOB bathing tasks  Balance Balance Balance Assessed: Yes Static Sitting Balance Static Sitting - Balance Support: Feet supported Static Sitting - Level of Assistance: 2: Max assist Dynamic Sitting Balance Dynamic Sitting - Balance Support: During functional activity Dynamic Sitting - Level of Assistance: 2: Max assist Static Standing Balance Static Standing - Level of Assistance: 1: +2 Total assist Extremity/Trunk Assessment RUE Assessment RUE Assessment: Exceptions to [facility] General Strength Comments: strength grossly 3+/5 with grip as well as 4/5 with triceps and biceps.  Did not test further secondary to lethargy. LUE Assessment LUE  Assessment: Exceptions to [facility] General Strength Comments: Strength grossly 3+/5 with grip as well as 4/5 with triceps and biceps.  Did not test further secondary to lethargy.     Refer to Care Plan for Long Term Goals  Recommendations for other services: None    Discharge Criteria: Patient will be discharged from OT if patient refuses treatment 3 consecutive times without medical reason, if treatment goals not met, if there is a change in medical status, if patient makes no progress towards goals or if patient is discharged from hospital.  The above assessment, treatment plan, treatment alternatives and goals were discussed and mutually agreed upon: by patient  Libra Gatz OTR/L  06/15/2019, 4:01 PM

## 2019-06-15 NOTE — Patient Care Conference (Signed)
Inpatient RehabilitationTeam Conference and Plan of Care Update Date: 06/15/2019   Time: 10:50 AM    Patient Name: Tanya Knight      Medical Record Number: [id number]  Date of Birth: [birth year] Sex: Female         Room/Bed: 4W21C/4W21C-01 Payor Info: Payor: [employer] MEDICARE / Plan: [employer] MEDICARE CHOICE PPO / Product Type: *No Product type* /    Admit Date/Time:  06/14/2019  3:24 PM  Primary Diagnosis:  <principal problem not specified>  Patient Active Problem List   Diagnosis Date Noted  . Hyperlipidemia 06/14/2019  . Cerebellar vermis ICH with SDH and SAH, likely hypertensive 06/10/2019  . Hyponatremia 09/08/2014  . Advance directive discussed with patient 09/08/2014  . Routine general medical examination at a health care facility 09/03/2011  . Essential hypertension, benign 10/12/2006  . GERD 10/12/2006  . Osteoporosis 10/12/2006  . Seizure disorder ([place]) 10/12/2006    Expected Discharge Date: Expected Discharge Date: (TBD)  Team Members Present: Physician leading conference: Dr. Alysia Penna Social Worker Present: Lennart Pall, LCSW Nurse Present: Dorien Chihuahua, RN;Other (comment)(Inka Gibson, LPN) Case Manager: Karene Fry, RN PT Present: Michaelene Song, PT OT Present: Clyda Greener, OT SLP Present: Jettie Booze, CF-SLP PPS Coordinator present : Ileana Ladd, Burna Mortimer, SLP     Current Status/Progress Goal Weekly Team Focus  Bowel/Bladder   incontinent of B&B; LBM 1/31 per report  train B&B  assess toileting needs q shift/prn   Swallow/Nutrition/ Hydration             ADL's   eval pending         Mobility   modA for bed mobility & supine<>sit, modA for sitting balance d/t posterolateral leaning but when corrected able to maintain balance with minA, significantly lethargic requiring stimulation inbetween each task to attend to task, movement in all 4 extremities, very slow to initiate movement  supervision  sitting & standing balance, gait, functional  mobility, attention   Communication   Eval pending         Safety/Cognition/ Behavioral Observations  Eval pending         Pain   c/o headache pain 9/10; prn tramadol and tylenol  pain <5  assess pain q shift/prn; give prn medications   Skin   scattered bruising, abrasions bilateral elbows, upper lip, and hands  remain free of further breakdown/infection  assess q shift/prn    Rehab Goals Patient on target to meet rehab goals: Yes *See Care Plan and progress notes for long and short-term goals.     Barriers to Discharge  Current Status/Progress Possible Resolutions Date Resolved   Nursing                  PT  Inaccessible home environment;Other (comments)  dizziness & lethargy; 3 steps to enter              [place] home environment;Decreased caregiver support;Incontinence Home with spouse and son will assist as needed Son assisting both parents as needed          Discharge Planning/Teaching Needs:  Home with spouse and son  TBD   Team Discussion: CT head ordered, urine checked, chest xray ordered, more lethargic, nausea.  RN sleeping, refused breakfast, BM 1/31.  OT pending.  PT lethargic, was  min A on acute.  SLP lethargic, dysarthria, speech 50% intelligible, STM impaired.  Has a son; lives with husband.   Revisions to Treatment Plan: N/A     Medical Summary Current Status: Patient lethargic, nausea with movement.  Poor activity tolerance Weekly Focus/Goal: Initiate rehabilitation program, no work-up of lethargy  Barriers to Discharge: Medical stability   Possible Resolutions to Barriers: Repeat CT of the head, check urine check chest x-ray, Zofran prior to therapy   Continued Need for Acute Rehabilitation Level of Care: The patient requires daily medical management by a physician with specialized training in physical medicine and rehabilitation for the following reasons: Direction of a multidisciplinary physical  rehabilitation program to maximize functional independence : Yes Medical management of patient stability for increased activity during participation in an intensive rehabilitation regime.: Yes Analysis of laboratory values and/or radiology reports with any subsequent need for medication adjustment and/or medical intervention. : Yes   I attest that I was present, lead the team conference, and concur with the assessment and plan of the team.   Jodell Cipro M 06/16/2019, 10:03 AM   Team conference was held via web/ teleconference due to [place] - 19

## 2019-06-15 NOTE — Progress Notes (Addendum)
[facility] PHYSICAL MEDICINE & REHABILITATION PROGRESS NOTE   Subjective/Complaints:  Nauseated with therapy this am, also is lethargic, no reports of problems with sleep last noc.  ROS- no CP, SOB, no abd pain  Objective:   No results found. Recent Labs    06/14/19 0429 06/15/19 0712  WBC 9.8 10.9*  HGB 13.6 14.1  HCT 40.2 41.1  PLT 298 356   Recent Labs    06/14/19 0429 06/15/19 0712  NA 133* 130*  K 3.7 3.5  CL 99 92*  CO2 24 25  GLUCOSE 102* 123*  BUN 23 14  CREATININE 0.64 0.47  CALCIUM 9.0 9.3   No intake or output data in the 24 hours ending 06/15/19 [id number]   Physical Exam: Vital Signs Blood pressure (!) 179/90, pulse (!) 57, temperature 98.6 F (37 C), resp. rate 16, SpO2 99 %.  General: No acute distress Mood and affect are appropriate Heart: Regular rate and rhythm no rubs murmurs or extra sounds Lungs: Clear to auscultation, breathing unlabored, no rales or wheezes Abdomen: Positive bowel sounds, soft nontender to palpation, nondistended Extremities: No clubbing, cyanosis, or edema Skin: No evidence of breakdown, no evidence of rash Neurologic: Cranial nerves II through XII intact, motor strength is 5/5 in bilateral deltoid, bicep, tricep, grip, hip flexor, knee extensors, ankle dorsiflexor and plantar flexor Sensory exam normal sensation to light touch and proprioception in bilateral upper and lower extremities Cerebellar exam normal finger to nose to finger as well as heel to shin in bilateral upper and lower extremities Musculoskeletal: Full range of motion in all 4 extremities. No joint swelling     Assessment/Plan: 1. Functional deficits secondary to Cerebellar vermis ICH which require 3+ hours per day of interdisciplinary therapy in a comprehensive inpatient rehab setting.  Physiatrist is providing close team supervision and 24 hour management of active medical problems listed below.  Physiatrist and rehab team continue to assess barriers  to discharge/monitor patient progress toward functional and medical goals  Care Tool:  Bathing              Bathing assist       Upper Body Dressing/Undressing Upper body dressing   What is the patient wearing?: (pt is wearing a hospital gown.)    Upper body assist Assist Level: Total Assistance - Patient < 25%    Lower Body Dressing/Undressing Lower body dressing            Lower body assist       Toileting Toileting    Toileting assist Assist for toileting: Dependent - Patient 0%     Transfers Chair/bed transfer  Transfers assist           Locomotion Ambulation   Ambulation assist              Walk 10 feet activity   Assist           Walk 50 feet activity   Assist           Walk 150 feet activity   Assist           Walk 10 feet on uneven surface  activity   Assist           Wheelchair     Assist               Wheelchair 50 feet with 2 turns activity    Assist            Wheelchair 150 feet activity  Assist          Blood pressure (!) 179/90, pulse (!) 57, temperature 98.6 F (37 C), resp. rate 16, SpO2 99 %.    1.  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  Looks more leathargic than on Monday during consult , check urine, CXR, CT head, no sedating meds , may need sleep graph  Spoke with husband and updated him.             -patient may  shower              -ELOS/Goals: ~ 2.5 to 3 weeks/ Goals CGA  2.  Antithrombotics:  -DVT/anticoagulation: Subcutaneous heparin initiated 06/11/2019              -antiplatelet therapy: N/A  3. Pain Management: Tramadol as needed, Fioricet as needed for headaches  4. Mood: Provide emotional support              -antipsychotic agents: N/A  5. Neuropsych: This patient is NOT capable  of making decisions on her own behalf.  6. Skin/Wound Care: Routine skin checks  7. Fluids/Electrolytes/Nutrition: Routine in and outs with follow-up chemistries  8.  Hypertension.  Norvasc 10 mg daily, Toprol-XL 50 mg daily.  Monitor with increased mobility- monitor closely.   9.  History of seizure disorder.  Continue Depakote 500 mg every 12 hours  For old seizure d/o as well as prevention of new seizures  10. Diplopia- suggest eye patch intermittently to help with double vision  11. Hypokalemia- last K+ 3.2- was repleted- will monitor, also with mild hyponatremia , not on diuretics   12. Severe daily headaches due to [place]- could add Topamax vs increasing Depakote for HA control-   13. Nausea/+/- vomiting- suggest zofran since less sedating- although can cause constipation- try prn, or scheduled before breakfast to help with nausea.  LOS: 1 days A FACE TO [place] E Tanya Knight 06/15/2019, 9:21 AM

## 2019-06-16 ENCOUNTER — Inpatient Hospital Stay (HOSPITAL_COMMUNITY): Payer: Medicare PPO | Admitting: Physical Therapy

## 2019-06-16 ENCOUNTER — Inpatient Hospital Stay (HOSPITAL_COMMUNITY): Payer: Medicare PPO | Admitting: Speech Pathology

## 2019-06-16 ENCOUNTER — Inpatient Hospital Stay (HOSPITAL_COMMUNITY): Payer: Medicare PPO | Admitting: Occupational Therapy

## 2019-06-16 NOTE — Progress Notes (Signed)
Speech Language Pathology Daily Session Note  Patient Details  Name: Tanya Knight MRN: [id number] Date of Birth: 1941-04-06  Today's Date: 06/16/2019 SLP Individual Time: 0730-0828 SLP Individual Time Calculation (min): 58 min  Short Term Goals: Week 1: SLP Short Term Goal 1 (Week 1): Pt will detect and correct errors during functionla tasks with Min A verbal/visual cues. SLP Short Term Goal 2 (Week 1): Pt will recall new and daily information with Min A verbal/visual cues for use of compensatory strategies. SLP Short Term Goal 3 (Week 1): Pt will utilize speech intelligibility strategies (specifically increased vocal intensity and overarticulation) with Min A verbal/visual cues to achieve ~80% intelligibility at the sentence level.  Skilled Therapeutic Interventions: Pt was seen for skilled ST targeting cognition, and a bedside swallow was performed due to pt's lethargy as well as deficits noted during attempts to consume regular breakfast.   BSE: Pt presents with mild oral dysphagia, likely impacted by pt's current level of fatigue and current deficits. Prolonged mastication of regular breakfast solids noted, as well as trace lingual residue. With verbal cues, pt used lingual wash to effectively clear residue, however given pt's inefficiency of mastication and lethargy, would recommend downgrade to Dysphagia 3 solids to allow for easier intake and to build up endurance. Continue thin liquids. During skilled observation of pt consuming pills whole with water (given by RN), pt exhibited decreased bolus cohesion in addition to 1 immediate cough with medium to large sized pills. Much more efficient oral transit of pills noted when administered by RN one at a time and whole with applesauce. Additionally, not overt s/sx aspiration were observed with pill intake with applesauce. Therefore, recommend administer pills whole in puree, and large pills should be broken in half. Full supervision with meals. ST  will follow to ensure diet safety and efficiency as well as potential for advancement.    Cognitive interventions focused on arousal, initiation, sustained attention, and basic problem solving. Pt required fluctuating Mod-Max A multimodal cues for arousal throughout session. Overall Mod A verbal and tactile cues required for task initiation. Her eyes remained closed for majority of interactions, however she provided more verbal responses in comparison to yesterday and was awake enough for PO consumption. Pt required Max faded to Mod A verbal cues for sequencing steps of oral care and problem solving how to reposition herself in bed to allow for greater comfort. Pt left laying in bed with alarm set and needs within reach. Continue per current plan of care.       Pain Pain Assessment Pain Scale: Faces Faces Pain Scale: Hurts little more Pain Type: Acute pain Pain Location: Head Pain Descriptors / Indicators: Headache Pain Onset: On-going Pain Intervention(s): RN made aware Multiple Pain Sites: No  Therapy/Group: Individual Therapy  Arbutus Leas 06/16/2019, 9:32 AM

## 2019-06-16 NOTE — Progress Notes (Signed)
Patient was participating with therapy when RN went to check patient.

## 2019-06-16 NOTE — Plan of Care (Signed)
  Problem: RH BOWEL ELIMINATION Goal: RH STG MANAGE BOWEL WITH ASSISTANCE Description: STG Manage Bowel with min Assistance. Outcome: Not Progressing; LBM 1/31 PA aware; meds adjusted; decrease appetite due to lethargy at times   Problem: RH BLADDER ELIMINATION Goal: RH STG MANAGE BLADDER WITH ASSISTANCE Description: STG Manage Bladder With min Assistance Outcome: Not Progressing; incontinence   Problem: RH KNOWLEDGE DEFICIT Goal: RH STG INCREASE KNOWLEDGE OF HYPERTENSION Description: Patient/caregiver will verbalize understanding of management of HTN including diet, exercise, medications, monitoring, and follow up care with min assist. Outcome: Not Progressing; lethargy at times; alert x 1

## 2019-06-16 NOTE — Care Management (Signed)
Inpatient Rehabilitation Center Individual Statement of Services  Patient Name:  Tanya Knight  Date:  06/16/2019  Welcome to the [facility].  Our goal is to provide you with an individualized program based on your diagnosis and situation, designed to meet your specific needs.  With this comprehensive rehabilitation program, you will be expected to participate in at least 3 hours of rehabilitation therapies Monday-Friday, with modified therapy programming on the weekends.  Your rehabilitation program will include the following services:  Physical Therapy (PT), Occupational Therapy (OT), Speech Therapy (ST), 24 hour per day rehabilitation nursing, Therapeutic Recreaction (TR), Neuropsychology, Case Management (Social Worker), Rehabilitation Medicine, Nutrition Services and Pharmacy Services  Weekly team conferences will be held on Wednesdays to discuss your progress.  Your Social Worker will talk with you frequently to get your input and to update you on team discussions.  Team conferences with you and your family in attendance may also be held.  Expected length of stay: 2-2.5 weeks  Overall anticipated outcome: Supervision overall with exception of CGA for ADLs, bathing, showering, toileting and stairs.  Depending on your progress and recovery, your program may change. Your Social Worker will coordinate services and will keep you informed of any changes. Your Social Worker's name and contact numbers are listed  below.  The following services may also be recommended but are not provided by the [facility]:    [facility] will be made to provide these services after discharge if needed.  Arrangements include referral to agencies that provide these services.  Your insurance has been verified to be:  [employer] Medicare Choice PPO Your primary doctor is:  Dr. Viviana Simpler  Pertinent information will be shared with your doctor and your insurance company.  Social Worker:  [place], [place] or ([phone]   Information discussed with and copy given to patient by: Margarito Liner, 06/16/2019, 4:46 PM

## 2019-06-16 NOTE — Progress Notes (Signed)
Occupational Therapy Session Note  Patient Details  Name: Tanya Knight MRN: [id number] Date of Birth: December 09, 1940  Today's Date: 06/16/2019 OT Individual Time: [id number] OT Individual Time Calculation (min): 72 min    Short Term Goals: Week 1:  OT Short Term Goal 1 (Week 1): Pt will maintain sustained attention for 30 mins with no more than min instructional cueing during ADL tasks. OT Short Term Goal 2 (Week 1): Pt will complete UB selfcare in sitting with supervision. OT Short Term Goal 3 (Week 1): Pt will complete LB dressing sit to stand with mod assist. OT Short Term Goal 4 (Week 1): Pt will complete toilet transfers stand pivot with the RW with min assist.  Skilled Therapeutic Interventions/Progress Updates:    Pt in bed to start session with eyes closed but opened them to verbal salutations and questions from therapist.  She was able to state place and reason for hospitalization.  She was not aware that is was February and guessed that the day of the week was Monday.  She transferred from supine to sit on the right side of the bed with mod assist.  She maintained a posterior pelvic tilt during bathing with cervical protraction and thoracic rounding.  LOB posterior at times while sitting statically as well as when working on bathing tasks.  Mod assist for static sitting balance with max assist for dynamic when attempting to wash her feet or donn LB clothing.  She reported dizziness at 8/10 in sitting as well as supine.  She reported that closing her eyes did not improve the dizziness.  No noted resting nystagmus when therapist examined her this session.  She was able to donn a pullover shirt with min assist.  She donned brief and pants sit to stand with mod assist as well.  Max assist for stand pivot transfer to the wheelchair with pt maintaining flexed posture in standing as well as her knees buckling when taking steps.  She sat in the wheelchair at the sink for combing her hair.  Before  transfer back to the bed had pt attempt visual testing.  When therapist held up fingers at midline, to the right, and to the left, she reported seeing the correct number, however when looking at therapist at midline, to the right, and left she reported seeing two of him.  She did not track any when tested as she was still closing her eyes frequently and when attempting to touch therapist's fingers or pick up something presented, she would close the left eye.  When asked why she was closing her eye she stated "Because it's easier".  Will attempt further vision eval once she is more alert.  She completed stand pivot transfer back to the bed with max assist for 3-4 steps up the EOB.  Max assist for sit to supine with NT in room at the end of session to take vitals.  Call button and phone in reach with bed alarm in place.  Have ordered patch for pt to wear to decrease diplopia until further recommendations can be made.    Therapy Documentation Precautions:  Precautions Precautions: Fall Precaution Comments: dizziness, diplopia, elevated BP Restrictions Weight Bearing Restrictions: No  Pain: Pain Assessment Pain Scale: 0-10 Faces Pain Scale: Hurts whole lot Pain Type: Acute pain Pain Location: Head Pain Orientation: Anterior Pain Descriptors / Indicators: Aching Pain Frequency: Intermittent Pain Onset: On-going Pain Intervention(s): Medication (See eMAR) ADL: See Care Tool Section for some details of mobility and selfcare  Therapy/Group:  Individual Therapy  Makaria Poarch OTR/L 06/16/2019, 4:01 PM

## 2019-06-16 NOTE — Progress Notes (Signed)
Physical Therapy Session Note  Patient Details  Name: Tanya Knight MRN: [id number] Date of Birth: [birth year]  Today's Date: 06/16/2019 PT Individual Time: [id number] PT Individual Time Calculation (min): 16 min   and  Today's Date: 06/16/2019 PT Missed Time: 44 Minutes Missed Time Reason: Patient fatigue;Other (Comment)(lethargy)  Short Term Goals: Week 1:  PT Short Term Goal 1 (Week 1): Pt will participate in static standing balance with modA PT Short Term Goal 2 (Week 1): Pt will demonstrate improved attention to task requiring stimulation <50% during session PT Short Term Goal 3 (Week 1): Pt will perform sit<>stand transfer with minA PT Short Term Goal 4 (Week 1): Pt will perform dynamic sitting balance with minA PT Short Term Goal 5 (Week 1): Pt will initiate gait training  Skilled Therapeutic Interventions/Progress Updates:    Pt received semi-R sidelying poorly positioned in the bed in a deep sleep with mouth breathing. Therapist attempted to provide verbal and tactile stimulus to awaken patient but she would not arouse. Therapist donned TED hose total assist for DVT prophylaxis while continuing to provide verbal stimulus and she continued to remain asleep. Therapist assisted with rolling R/L in bed with total assist for increased stimulus and to improve pt positioning with her only opening eyes for ~2 seconds. +2 total assist for scooting towards HOB and pt continued to remain asleep with eyes closed. Pt therapeutically positioned in the bed. Assessed vitals: BP 172/76 (MAP 104), HR 63bpm, SpO2 98% on RA. RN notified of pt's lethargy and pt left supine in bed, asleep with needs in reach and bed alarm on. Missed 44 minutes of skilled physical therapy.   Therapy Documentation Precautions:  Precautions Precautions: Fall Precaution Comments: high BP, dizziness Restrictions Weight Bearing Restrictions: No  Pain: No indications of pain.   Therapy/Group: Individual Therapy  Tawana Scale, PT, DPT 06/16/2019, 7:58 AM

## 2019-06-16 NOTE — Care Management (Signed)
Patient Details  Name: Tanya Knight MRN: [id number] Date of Birth: 06-Mar-1941  Today's Date: 06/16/2019  Problem List:  Patient Active Problem List   Diagnosis Date Noted  . Hyperlipidemia 06/14/2019  . Cerebellar vermis ICH with SDH and SAH, likely hypertensive 06/10/2019  . Hyponatremia 09/08/2014  . Advance directive discussed with patient 09/08/2014  . Routine general medical examination at a health care facility 09/03/2011  . Essential hypertension, benign 10/12/2006  . GERD 10/12/2006  . Osteoporosis 10/12/2006  . Seizure disorder ([place]) 10/12/2006   Past Medical History:  Past Medical History:  Diagnosis Date  . Fracture of metatarsal    Repair of fractured R metatarsal --Dr Duda---4/08  . GERD (gastroesophageal reflux disease)   . Hypertension   . Melanoma in situ [facility]) 7/17   Dr Kellie Moor  . Osteoporosis   . Seizure disorder ([place])   . Seizures ([place])   . Vitamin D deficiency    Past Surgical History:  Past Surgical History:  Procedure Laterality Date  . CATARACT EXTRACTION, BILATERAL Bilateral 2014  . EYE SURGERY     Obstructed tear duct  . FOOT SURGERY  2008  . MELANOMA EXCISION Left 11/2015   left lower leg  . TEAR DUCT PROBING  10/12   temporary stent  . TONSILLECTOMY AND ADENOIDECTOMY  1948   Social History:  reports that she has never smoked. She has never used smokeless tobacco. She reports current alcohol use. She reports that she does not use drugs.  Family / Support Systems Marital Status: Married Patient Roles: Spouse, Parent Spouse/Significant Other: Maelani James Children: Son Chandre Deras Anticipated Caregiver: spouse with son assisting as needed Caregiver Availability: 24/7  Social History Preferred language: English Religion: Non-Denominational Read: Yes Write: Yes   Abuse/Neglect Abuse/Neglect Assessment Can Be Completed: Yes Physical Abuse: Denies Verbal Abuse: Denies Sexual Abuse: Denies Exploitation of patient/patient's  resources: Denies Self-Neglect: Denies  Emotional Status Pt's affect, behavior and adjustment status: Normal affect, keeps eyes closed, attempted to smile during conversation and is soft spoken  Patient / Family Perceptions, Expectations & Goals Pt/Family understanding of illness & functional limitations: Patient is alert and oriented however does not have an understanding of her current health state or functional limitations. The husband appears to have a fair understanding of the patient's status and functional limitations. Premorbid pt/family roles/activities: The patient was independent prior to admission Anticipated changes in roles/activities/participation: Independent prior to admission; will need assistance at discharge Pt/family expectations/goals: The husband notes he would like for her to be a functional as possible as he has health issues and relies on a cane for ambulation  [employer] available at discharge: Spouse or son will provide for transportation at discharge  Discharge Planning Living Arrangements: Spouse/significant other [place]: Spouse/significant other Type of Residence: Private residence Does the patient have any problems obtaining your medications?: No Home Management: Husband will manage the home at [place] Barriers to Discharge: Inaccessible home environment, Decreased caregiver support, Incontinence Sw Barriers to Discharge Comments: Home with spouse and son will assist as needed Social Work Anticipated Follow Up Needs: HH/OP Expected length of stay: 2-2.5 weeks  Clinical Impression The husband seems overwhelmed by the difference in the patient since admission and reports he is concerned about her but due to the community situation (Covid) he is not coming into the hospital. His son will provide information about his wife to him. Noted he will do what ever is needed to get his wife back home.  Hervey Ard,  Dalbert Batman 06/16/2019,  3:05 PM

## 2019-06-16 NOTE — Progress Notes (Signed)
Team Conference Report to [employer] discussion was reviewed with the spouse , including goals, any changes in plan of care and target length of stay of 2-2.5 weeks with overall goals set for supervision overall and CGA for steps and ADLs (shower/bath/toileting) .  Spouse  expressed understanding of information reviewed and noted he felt he could provide needed assistance or would get someone to provide the assistance for stairs and other activities as he ambulates with a cane and is in agreement.  The patient has a target discharge date of (TBD)Reviewed [facility] referral process and that SW/CM would order DME for discharge as recommended by the treating therapist.   Margarito Liner 06/16/2019, 3:14 PM

## 2019-06-16 NOTE — Progress Notes (Signed)
[place] PHYSICAL MEDICINE & REHABILITATION PROGRESS NOTE   Subjective/Complaints:  Lethargic this morning. Opens eyes to verbal stimulation. Discussed with Linna Hoff, [place], who states patient later ate breakfast and he discussed her condition with patient's son this morning.   ROS- no CP, SOB, no abd pain  Objective:   DG Chest 2 View  Result Date: 06/15/2019 CLINICAL DATA:  Lethargy. EXAM: CHEST - 2 VIEW COMPARISON:  07/18/2015.  CT chest 01/05/2015. FINDINGS: Patient is rotated. Trachea is midline. Heart size is accentuated by AP technique. Scarring in the lingula. Favor superimposition of shadows in the right costophrenic angle. Lungs are otherwise clear. No pleural fluid. Old right rib fracture. Mid and lower thoracic and upper lumbar compression fractures are again noted. IMPRESSION: No acute findings. Electronically Signed   By: Lorin Picket M.D.   On: 06/15/2019 10:12   CT HEAD WO CONTRAST  Result Date: 06/15/2019 CLINICAL DATA:  Follow-up stroke EXAM: CT HEAD WITHOUT CONTRAST TECHNIQUE: Contiguous axial images were obtained from the base of the skull through the vertex without intravenous contrast. COMPARISON:  06/12/2019, 06/10/2019 FINDINGS: Brain: No significant interval change in a large intraparenchymal hemorrhage of the midline cerebellum with adjacent edema, which nearly effaces the fourth ventricle. Small volume subarachnoid hemorrhage in the posterior fossa and about the inferior occipital lobes. There is no significant interval change in mildly dilated appearance of the lateral ventricles, however there is perhaps slight interval increase in periventricular white matter hypodensity, suggesting transependymal edema. Caliber of the ventricles is somewhat increased in comparison to initial presentation CT dated 06/10/2019. Incidental note of cavum septum pellucidum et vergae variant of the lateral ventricles. Vascular: No hyperdense vessel or unexpected calcification. Skull: Normal.  Negative for fracture or focal lesion. Sinuses/Orbits: No acute finding. Other: None. IMPRESSION: 1. No significant interval change in large intraparenchymal hemorrhage of the midline cerebellum with adjacent edema, which nearly effaces the fourth ventricle. 2. There is no significant interval change in mildly dilated appearance of the lateral ventricles, however there is perhaps slight interval increase in periventricular white matter hypodensity, suggesting transependymal edema. Caliber of the ventricles is somewhat increased in comparison to initial presentation CT dated 06/10/2019. Findings are concerning for obstructive hydrocephalus. 3. Small volume subarachnoid hemorrhage in the posterior fossa and about the inferior occipital lobes, unchanged. Electronically Signed   By: Eddie Candle M.D.   On: 06/15/2019 14:11   Recent Labs    06/14/19 0429 06/15/19 0712  WBC 9.8 10.9*  HGB 13.6 14.1  HCT 40.2 41.1  PLT 298 356   Recent Labs    06/14/19 0429 06/15/19 0712  NA 133* 130*  K 3.7 3.5  CL 99 92*  CO2 24 25  GLUCOSE 102* 123*  BUN 23 14  CREATININE 0.64 0.47  CALCIUM 9.0 9.3    Intake/Output Summary (Last 24 hours) at 06/16/2019 1000 Last data filed at 06/16/2019 [id number] Gross per 24 hour  Intake 977.41 ml  Output 650 ml  Net 327.41 ml     Physical Exam: Vital Signs Blood pressure (!) 172/76, pulse 68, temperature 97.9 F (36.6 C), temperature source Oral, resp. rate 16, weight 56.5 kg, SpO2 98 %. General: No acute distress, lethargic, opens eyes to stimulation.  Mood and affect are appropriate Heart: Regular rate and rhythm no rubs murmurs or extra sounds Lungs: Clear to auscultation, breathing unlabored, no rales or wheezes Abdomen: Positive bowel sounds, soft nontender to palpation, nondistended Extremities: No clubbing, cyanosis, or edema Skin: No evidence of breakdown, no evidence  of rash Neurologic: Cranial nerves II through XII intact, motor strength is 5/5 in bilateral  deltoid, bicep, tricep, grip, hip flexor, knee extensors, ankle dorsiflexor and plantar flexor Sensory exam normal sensation to light touch and proprioception in bilateral upper and lower extremities Cerebellar exam normal finger to nose to finger as well as heel to shin in bilateral upper and lower extremities Musculoskeletal: Full range of motion in all 4 extremities. No joint swelling   Assessment/Plan: 1. Functional deficits secondary to Cerebellar vermis ICH which require 3+ hours per day of interdisciplinary therapy in a comprehensive inpatient rehab setting.  Physiatrist is providing close team supervision and 24 hour management of active medical problems listed below.  Physiatrist and rehab team continue to assess barriers to discharge/monitor patient progress toward functional and medical goals  Care Tool:  Bathing    Body parts bathed by patient: Right arm, Left arm, Face   Body parts bathed by helper: Right lower leg, Left upper leg, Front perineal area, Buttocks, Right upper leg, Left lower leg, Chest, Abdomen     Bathing assist Assist Level: Maximal Assistance - Patient 24 - 49%     Upper Body Dressing/Undressing Upper body dressing   What is the patient wearing?: Hospital gown only    Upper body assist Assist Level: Total Assistance - Patient < 25%    Lower Body Dressing/Undressing Lower body dressing      What is the patient wearing?: Incontinence brief     Lower body assist Assist for lower body dressing: Total Assistance - Patient < 25%     Toileting Toileting    Toileting assist Assist for toileting: Maximal Assistance - Patient 25 - 49%     Transfers Chair/bed transfer  Transfers assist  Chair/bed transfer activity did not occur: Safety/medical concerns        Locomotion Ambulation   Ambulation assist   Ambulation activity did not occur: Safety/medical concerns          Walk 10 feet activity   Assist  Walk 10 feet activity did  not occur: Safety/medical concerns        Walk 50 feet activity   Assist Walk 50 feet with 2 turns activity did not occur: Safety/medical concerns         Walk 150 feet activity   Assist Walk 150 feet activity did not occur: Safety/medical concerns         Walk 10 feet on uneven surface  activity   Assist Walk 10 feet on uneven surfaces activity did not occur: Safety/medical concerns         Wheelchair     Assist     Wheelchair activity did not occur: Safety/medical concerns         Wheelchair 50 feet with 2 turns activity    Assist    Wheelchair 50 feet with 2 turns activity did not occur: Safety/medical concerns       Wheelchair 150 feet activity     Assist  Wheelchair 150 feet activity did not occur: Safety/medical concerns       Blood pressure (!) 172/76, pulse 68, temperature 97.9 F (36.6 C), temperature source Oral, resp. rate 16, weight 56.5 kg, SpO2 98 %.    1.  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  Looks more leathargic than on Monday during consult , check urine, CXR, CT head, no sedating meds ,  may need sleep graph  Spoke with husband and updated him.  2/4: Linna Hoff, [place], updated patient's son today and daughter yesterday. He has also been in communication with surgeon who says that CT is stable and it will take time for hemorrhage to resorb.              -patient may  shower              -ELOS/Goals: ~ 2.5 to 3 weeks/ Goals CGA  2.  Antithrombotics:  -DVT/anticoagulation: Subcutaneous heparin initiated 06/11/2019              -antiplatelet therapy: N/A  3. Pain Management: Continue Tramadol as needed, Fioricet as needed for headaches  4. Mood: Provide emotional support              -antipsychotic agents: N/A  5. Neuropsych: This patient is NOT capable of making  decisions on her own behalf.  6. Skin/Wound Care: Continue routine skin checks  7. Fluids/Electrolytes/Nutrition: Routine in and outs with follow-up chemistries  8.  Hypertension.  Norvasc 10 mg daily, Toprol-XL 50 mg daily.  Monitor with increased mobility- monitor closely.   2/4: Significantly elevated this morning. Flowsheet reviewed and last several reads have been very elevated though pulse is borderline to bradycardic. Will add hydralazine 10mg  daily for better BP control.  9.  History of seizure disorder.  Continue Depakote 500 mg every 12 hours  For old seizure d/o as well as prevention of new seizures  10. Diplopia- suggest eye patch intermittently to help with double vision  11. Hypokalemia- last K+ 3.2- was repleted- will monitor, also with mild hyponatremia , not on diuretics   12. Severe daily headaches due to [place]- could add Topamax vs increasing Depakote for HA control-   13. Nausea/+/- vomiting- suggest zofran since less sedating- although can cause constipation- try prn, or scheduled before breakfast to help with nausea.  LOS: 2 days A FACE TO FACE EVALUATION WAS PERFORMED  Shermaine Rivet P Jem Castro 06/16/2019, 10:00 AM

## 2019-06-17 ENCOUNTER — Inpatient Hospital Stay (HOSPITAL_COMMUNITY): Payer: Medicare PPO | Admitting: Speech Pathology

## 2019-06-17 ENCOUNTER — Inpatient Hospital Stay (HOSPITAL_COMMUNITY): Payer: Medicare PPO | Admitting: Occupational Therapy

## 2019-06-17 ENCOUNTER — Inpatient Hospital Stay (HOSPITAL_COMMUNITY): Payer: Medicare PPO | Admitting: Physical Therapy

## 2019-06-17 LAB — BASIC METABOLIC PANEL
Anion gap: 10 (ref 5–15)
BUN: 10 mg/dL (ref 8–23)
CO2: 27 mmol/L (ref 22–32)
Calcium: 9.1 mg/dL (ref 8.9–10.3)
Chloride: 88 mmol/L — ABNORMAL LOW (ref 98–111)
Creatinine, Ser: 0.43 mg/dL — ABNORMAL LOW (ref 0.44–1.00)
GFR calc Af Amer: >60 mL/min (ref >60)
GFR calc non Af Amer: >60 mL/min (ref >60)
Glucose, Bld: 116 mg/dL — ABNORMAL HIGH (ref 70–99)
Potassium: 2.6 mmol/L — CL (ref 3.5–5.1)
Sodium: 125 mmol/L — ABNORMAL LOW (ref 135–145)

## 2019-06-17 MED ORDER — POTASSIUM CHLORIDE 10 MEQ/100ML IV SOLN
10.0000 meq | INTRAVENOUS | Status: AC
  Administered 2019-06-17 (×4): 10 meq via INTRAVENOUS
  Filled 2019-06-17 (×4): qty 100

## 2019-06-17 MED ORDER — SODIUM CHLORIDE 0.9 % IV SOLN
INTRAVENOUS | Status: DC
  Administered 2019-06-18: 13:00:00 75 mL/h via INTRAVENOUS
  Administered 2019-06-19: 1000 mL via INTRAVENOUS

## 2019-06-17 NOTE — Plan of Care (Signed)
Patient's plan of care adjusted to 15/7 after speaking with care team and discussing with Marlowe Shores, PA as pt currently unable to tolerate therapy schedule with OT, PT, and SLP.   Page Spiro, PT, DPT

## 2019-06-17 NOTE — Progress Notes (Signed)
Patient complained of headache and nausea. While in room patient vomited small amount of clear, yellow emesis. Given prn Zofran 4mg  IV and prn tylenol for pain. Patient repositioned and given cold washcloth for head. No acute signs of distress. Will continue to monitor.

## 2019-06-17 NOTE — Progress Notes (Signed)
[place] PHYSICAL MEDICINE & REHABILITATION PROGRESS NOTE   Subjective/Complaints:  Appreciate Neuro assistance, Dr Leonie Man  ROS- no CP, SOB, no abd pain  Objective:   DG Chest 2 View  Result Date: 06/15/2019 CLINICAL DATA:  Lethargy. EXAM: CHEST - 2 VIEW COMPARISON:  07/18/2015.  CT chest 01/05/2015. FINDINGS: Patient is rotated. Trachea is midline. Heart size is accentuated by AP technique. Scarring in the lingula. Favor superimposition of shadows in the right costophrenic angle. Lungs are otherwise clear. No pleural fluid. Old right rib fracture. Mid and lower thoracic and upper lumbar compression fractures are again noted. IMPRESSION: No acute findings. Electronically Signed   By: Lorin Picket M.D.   On: 06/15/2019 10:12   CT HEAD WO CONTRAST  Result Date: 06/15/2019 CLINICAL DATA:  Follow-up stroke EXAM: CT HEAD WITHOUT CONTRAST TECHNIQUE: Contiguous axial images were obtained from the base of the skull through the vertex without intravenous contrast. COMPARISON:  06/12/2019, 06/10/2019 FINDINGS: Brain: No significant interval change in a large intraparenchymal hemorrhage of the midline cerebellum with adjacent edema, which nearly effaces the fourth ventricle. Small volume subarachnoid hemorrhage in the posterior fossa and about the inferior occipital lobes. There is no significant interval change in mildly dilated appearance of the lateral ventricles, however there is perhaps slight interval increase in periventricular white matter hypodensity, suggesting transependymal edema. Caliber of the ventricles is somewhat increased in comparison to initial presentation CT dated 06/10/2019. Incidental note of cavum septum pellucidum et vergae variant of the lateral ventricles. Vascular: No hyperdense vessel or unexpected calcification. Skull: Normal. Negative for fracture or focal lesion. Sinuses/Orbits: No acute finding. Other: None. IMPRESSION: 1. No significant interval change in large  intraparenchymal hemorrhage of the midline cerebellum with adjacent edema, which nearly effaces the fourth ventricle. 2. There is no significant interval change in mildly dilated appearance of the lateral ventricles, however there is perhaps slight interval increase in periventricular white matter hypodensity, suggesting transependymal edema. Caliber of the ventricles is somewhat increased in comparison to initial presentation CT dated 06/10/2019. Findings are concerning for obstructive hydrocephalus. 3. Small volume subarachnoid hemorrhage in the posterior fossa and about the inferior occipital lobes, unchanged. Electronically Signed   By: Eddie Candle M.D.   On: 06/15/2019 14:11   Recent Labs    06/15/19 0712  WBC 10.9*  HGB 14.1  HCT 41.1  PLT 356   Recent Labs    06/15/19 0712  NA 130*  K 3.5  CL 92*  CO2 25  GLUCOSE 123*  BUN 14  CREATININE 0.47  CALCIUM 9.3    Intake/Output Summary (Last 24 hours) at 06/17/2019 0913 Last data filed at 06/16/2019 1817 Gross per 24 hour  Intake 240 ml  Output --  Net 240 ml     Physical Exam: Vital Signs Blood pressure (!) 166/83, pulse 63, temperature (!) 97.5 F (36.4 C), temperature source Axillary, resp. rate 17, weight 56.5 kg, SpO2 96 %. General: No acute distress, lethargic, opens eyes to stimulation.  Mood and affect are appropriate Heart: Regular rate and rhythm no rubs murmurs or extra sounds Lungs: Clear to auscultation, breathing unlabored, no rales or wheezes Abdomen: Positive bowel sounds, soft nontender to palpation, nondistended Extremities: No clubbing, cyanosis, or edema Skin: No evidence of breakdown, no evidence of rash Neurologic: Cranial nerves II through XII intact, motor strength is 5/5 in bilateral deltoid, bicep, tricep, grip, hip flexor, knee extensors, ankle dorsiflexor and plantar flexor Sensory exam normal sensation to light touch and proprioception in bilateral upper  and lower extremities Cerebellar exam  normal finger to nose to finger as well as heel to shin in bilateral upper and lower extremities Musculoskeletal: Full range of motion in all 4 extremities. No joint swelling   Assessment/Plan: 1. Functional deficits secondary to Cerebellar vermis ICH which require 3+ hours per day of interdisciplinary therapy in a comprehensive inpatient rehab setting.  Physiatrist is providing close team supervision and 24 hour management of active medical problems listed below.  Physiatrist and rehab team continue to assess barriers to discharge/monitor patient progress toward functional and medical goals  Care Tool:  Bathing    Body parts bathed by patient: Face   Body parts bathed by helper: Front perineal area, Buttocks Body parts n/a: Right upper leg, Left upper leg, Right lower leg, Left lower leg, Abdomen, Chest, Left arm, Right arm(did not attempt)   Bathing assist Assist Level: Maximal Assistance - Patient 24 - 49%     Upper Body Dressing/Undressing Upper body dressing   What is the patient wearing?: Pull over shirt    Upper body assist Assist Level: Maximal Assistance - Patient 25 - 49%    Lower Body Dressing/Undressing Lower body dressing      What is the patient wearing?: Incontinence brief, Pants     Lower body assist Assist for lower body dressing: Maximal Assistance - Patient 25 - 49%     Toileting Toileting    Toileting assist Assist for toileting: Maximal Assistance - Patient 25 - 49%     Transfers Chair/bed transfer  Transfers assist  Chair/bed transfer activity did not occur: Safety/medical concerns  Chair/bed transfer assist level: Maximal Assistance - Patient 25 - 49%(stand pivot)     Locomotion Ambulation   Ambulation assist   Ambulation activity did not occur: Safety/medical concerns          Walk 10 feet activity   Assist  Walk 10 feet activity did not occur: Safety/medical concerns        Walk 50 feet activity   Assist Walk 50  feet with 2 turns activity did not occur: Safety/medical concerns         Walk 150 feet activity   Assist Walk 150 feet activity did not occur: Safety/medical concerns         Walk 10 feet on uneven surface  activity   Assist Walk 10 feet on uneven surfaces activity did not occur: Safety/medical concerns         Wheelchair     Assist Will patient use wheelchair at discharge?: No(Not anticipated; no LT goals set)   Wheelchair activity did not occur: Safety/medical concerns         Wheelchair 50 feet with 2 turns activity    Assist    Wheelchair 50 feet with 2 turns activity did not occur: Safety/medical concerns       Wheelchair 150 feet activity     Assist  Wheelchair 150 feet activity did not occur: Safety/medical concerns       Blood pressure (!) 166/83, pulse 63, temperature (!) 97.5 F (36.4 C), temperature source Axillary, resp. rate 17, weight 56.5 kg, SpO2 96 %.    1.  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  Looks more leathargic than on Monday during consult , check urine, CXR, CT head, no sedating meds , may need sleep graph  Spoke with husband and updated him.  2/4:  Linna Hoff, [place], updated patient's son today and daughter yesterday. He has also been in communication with surgeon who says that CT is stable and it will take time for hemorrhage to resorb.              -patient may  shower              -ELOS/Goals: ~ 2.5 to 3 weeks/ Goals CGA  2.  Antithrombotics:  -DVT/anticoagulation: Subcutaneous heparin initiated 06/11/2019              -antiplatelet therapy: N/A  3. Pain Management: Continue Tramadol as needed, Fioricet as needed for headaches  4. Mood: Provide emotional support              -antipsychotic agents: N/A  5. Neuropsych: This patient is NOT capable of making  decisions on her own behalf.  6. Skin/Wound Care: Continue routine skin checks  7. Fluids/Electrolytes/Nutrition: Routine in and outs with follow-up chemistries  8.  Hypertension.  Norvasc 10 mg daily, Toprol-XL 50 mg daily.  Monitor with increased mobility- monitor closely.    Vitals:   06/17/19 0543 06/17/19 0911  BP: (!) 182/91 (!) 166/83  Pulse: 63   Resp:    Temp: (!) 97.5 F (36.4 C)   SpO2:     9.  History of seizure disorder.  Continue Depakote 500 mg every 12 hours  For old seizure d/o as well as prevention of new seizures  10. Diplopia- suggest eye patch intermittently to help with double vision  11. Hypokalemia- last K+ 2.6- will supplement with K CL IV riders  will monitor, also with mild hyponatremia , not on diuretics  Hyponatremia- likely related to IVF will change from .45 NS to .9 NS - expect gradual improvement , recheck BMET in am  BMP Latest Ref Rng & Units 06/15/2019 06/14/2019 06/13/2019  Glucose 70 - 99 mg/dL 123(H) 102(H) 157(H)  BUN 8 - 23 mg/dL 14 23 18   Creatinine 0.44 - 1.00 mg/dL 0.47 0.64 0.75  Sodium 135 - 145 mmol/L 130(L) 133(L) 134(L)  Potassium 3.5 - 5.1 mmol/L 3.5 3.7 4.6  Chloride 98 - 111 mmol/L 92(L) 99 100  CO2 22 - 32 mmol/L 25 24 23   Calcium 8.9 - 10.3 mg/dL 9.3 9.0 9.2    12. Severe daily headaches due to ICH-add Topamax 13. Nausea/+/- vomiting- suggest zofran since less sedating- although can cause constipation- try prn, or scheduled before breakfast to help with nausea.  14. Dysphagia due to CVA   LOS: 3 days A FACE TO FACE EVALUATION WAS PERFORMED  Charlett Blake 06/17/2019, 9:13 AM

## 2019-06-17 NOTE — IPOC Note (Signed)
Overall Plan of Care [facility]) Patient Details Name: Tanya Knight MRN: [id number] DOB: 04/25/1941  Admitting Diagnosis: ICH (intracerebral hemorrhage) [facility])  Hospital Problems: Principal Problem:   Cerebellar vermis ICH with SDH and SAH, likely hypertensive     Functional Problem List: Nursing Bladder, Bowel, Medication Management, Motor, Safety, Pain, Perception  PT Balance, Endurance, Safety, Behavior, Motor, Sensory  OT Balance, Cognition, Endurance, Motor, Vision, Safety, Pain  SLP Cognition, Linguistic  TR         Basic ADL's: OT Eating, Grooming, Bathing, Dressing, Toileting     Advanced  ADL's: OT       Transfers: PT Bed Mobility, Bed to Chair, Car, Manufacturing systems engineer, Metallurgist: PT Ambulation, Stairs     Additional Impairments: OT None  SLP Communication, Social Cognition expression Memory, Awareness, Problem Solving  TR      Anticipated Outcomes Item Anticipated Outcome  Self Feeding modified independent  Swallowing      Basic self-care  min contact guard  Toileting  min contact guard   Bathroom Transfers min contact guard  Bowel/Bladder  Min/mod assist  Transfers  supervision  Locomotion  supervision  Communication  Supervision A  Cognition  Supervision A  Pain  < 3  Safety/Judgment  Min assist/ supervision   Therapy Plan: PT Intensity: Minimum of 1-2 x/day ,45 to 90 minutes PT Frequency: Total of 15 hours over 7 days of combined therapies PT Duration Estimated Length of Stay: 2 weeks OT Intensity: Minimum of 1-2 x/day, 45 to 90 minutes OT Frequency: 5 out of 7 days OT Duration/Estimated Length of Stay: 12-14 days SLP Intensity: Minumum of 1-2 x/day, 30 to 90 minutes SLP Frequency: 3 to 5 out of 7 days SLP Duration/Estimated Length of Stay: 2 weeks   Due to the current state of emergency, patients may not be receiving their 3-hours of Medicare-mandated therapy.   Team Interventions: Nursing Interventions  Patient/Family Education, Bladder Management, Bowel Management, Disease Management/Prevention, Medication Management, Pain Management, Discharge Planning, Cognitive Remediation/Compensation  PT interventions Ambulation/gait training, Balance/vestibular training, Cognitive remediation/compensation, DME/adaptive equipment instruction, Functional mobility training, Splinting/orthotics, Therapeutic Activities, UE/LE Strength taining/ROM, UE/LE Coordination activities, Therapeutic Exercise, Stair training, Patient/family education, Neuromuscular re-education, Disease management/prevention, Discharge planning, Psychosocial support  OT Interventions Balance/vestibular training, Discharge planning, Pain management, Self Care/advanced ADL retraining, Therapeutic Activities, UE/LE Coordination activities, Therapeutic Exercise, Patient/family education, Functional mobility training, Cognitive remediation/compensation, Disease mangement/prevention, Community reintegration, DME/adaptive equipment instruction, Neuromuscular re-education, UE/LE Strength taining/ROM, Wheelchair propulsion/positioning  SLP Interventions Cognitive remediation/compensation, English as a second language teacher, Functional tasks, Patient/family education, Speech/Language facilitation, Internal/external aids  TR Interventions    SW/CM Interventions Discharge Planning, Psychosocial Support, Disease Management/Prevention, Patient/Family Education   Barriers to Discharge MD  Medical stability  Nursing      PT Inaccessible home environment, Other (comments) dizziness & lethargy; 3 steps to enter  OT      SLP      SW Inaccessible home environment, Decreased caregiver support, Incontinence Home with spouse and son will assist as needed   Team Discharge Planning: Destination: PT-Home ,OT- Home , SLP-Home Projected Follow-up: PT-Home health PT, OT-  Home health OT, 24 hour supervision/assistance, SLP-Home Health SLP, 24 hour supervision/assistance Projected  Equipment Needs: PT-To be determined, OT- To be determined, SLP-None recommended by SLP Equipment Details: PT-has a w/c and walker (confirm with husband), OT-  Patient/family involved in discharge planning: PT- Patient,  OT-Patient unable/family or caregiver not available, SLP-Patient  MD ELOS: 12-14 days Medical Rehab Prognosis:  Excellent  Assessment: The patient has been admitted for CIR therapies with the diagnosis of cerebellar hemorrhage. The team will be addressing functional mobility, strength, stamina, balance, safety, adaptive techniques and equipment, self-care, bowel and bladder mgt, patient and caregiver education, NMR, visual-spatial awareness, vestibular rx, communication and cognition. Goals have been set at min/CG assist with self-care and supervision with mobility, supervison for cognition and communication.   Due to the current state of emergency, patients may not be receiving their 3 hours per day of Medicare-mandated therapy.    Meredith Staggers, MD, FAAPMR      See Team Conference Notes for weekly updates to the plan of care

## 2019-06-17 NOTE — Progress Notes (Signed)
Patient has a critical lab value. Potassium levels this morning was 2.6. Pt is ordered to have Potassium chloride 10 mEq in 154mL IVPB 1hr x4. Will continue to monitor. Amanda Cockayne, LPN

## 2019-06-17 NOTE — Progress Notes (Signed)
Occupational Therapy Session Note  Patient Details  Name: Tanya Knight MRN: [id number] Date of Birth: 1940/11/17  Today's Date: 06/17/2019 OT Individual Time: [id number] OT Individual Time Calculation (min): 72 min    Short Term Goals: Week 1:  OT Short Term Goal 1 (Week 1): Pt will maintain sustained attention for 30 mins with no more than min instructional cueing during ADL tasks. OT Short Term Goal 2 (Week 1): Pt will complete UB selfcare in sitting with supervision. OT Short Term Goal 3 (Week 1): Pt will complete LB dressing sit to stand with mod assist. OT Short Term Goal 4 (Week 1): Pt will complete toilet transfers stand pivot with the RW with min assist.  Skilled Therapeutic Interventions/Progress Updates:    Pt in bed to start session with eyes closed.  She would verbally attempt response to therapist when spoken too but still kept her eyes closed.  Max assist needed for supine to sit EOB with mod assist for initial sitting balance.  She needed max assist for stand pivot transfer to the wheelchair in order to work on grooming tasks at the sink.  Increased lean to the left in the wheelchair was noted.  Worked on pt completing oral hygiene from the wheelchair.  Mod demonstrational cueing for initiation with min assist overall.  Pt needed cueing to stop and setup of cup for rinsing.  Noted pt overshooting when attempting to reach for items when presented, likely secondary to diplopia, however pt kept eyes closed majority of the time and needed max instructional cueing to keep them open.  She was able to complete sit to stand with mod assist but needed max assist for static standing balance when washing her peri area secondary to increased knee and trunk flexion as well as posterior lean.  She needed max assist for donning pullover with max assist for donning new brief as well as.  Finished session with pt back in the bed as she could not maintain sustained attention more than 30 seconds to 1  minute before closing her eyes.  Max assist for transfer wheelchair to bed with mod assist to transfer to supine.  Call button and phone in reach with safety bed alarm in place.    Therapy Documentation Precautions:  Precautions Precautions: Fall Precaution Comments: dizziness, diplopia, elevated BP Restrictions Weight Bearing Restrictions: No  Vital Signs: Therapy Vitals Temp: (!) 97.5 F (36.4 C) Temp Source: Axillary Pulse Rate: 63 BP: (!) 166/83 Patient Position (if appropriate): Sitting Pain: Pain Assessment Pain Scale: Faces Faces Pain Scale: Hurts a little bit Pain Type: Acute pain Pain Location: Head Pain Orientation: Anterior Pain Descriptors / Indicators: Grimacing Pain Intervention(s): Repositioned;Emotional support ADL: See Care Tool Section for some details of mobility and selfcare  Therapy/Group: Individual Therapy  Shalen Petrak OTR/L 06/17/2019, 9:13 AM

## 2019-06-17 NOTE — Progress Notes (Signed)
Speech Language Pathology Daily Session Note  Patient Details  Name: Tanya Knight MRN: [id number] Date of Birth: 09/15/1940  Today's Date: 06/17/2019 SLP Individual Time: 1100-1115 SLP Individual Time Calculation (min): 15 min  Short Term Goals: Week 1: SLP Short Term Goal 1 (Week 1): Pt will detect and correct errors during functionla tasks with Min A verbal/visual cues. SLP Short Term Goal 2 (Week 1): Pt will recall new and daily information with Min A verbal/visual cues for use of compensatory strategies. SLP Short Term Goal 3 (Week 1): Pt will utilize speech intelligibility strategies (specifically increased vocal intensity and overarticulation) with Min A verbal/visual cues to achieve ~80% intelligibility at the sentence level. SLP Short Term Goal 4 (Week 1): Pt will consume current diet with minimal overt s/sx aspiration and demonstrate efficient mastication and oral clearance with Mod A verbal/visual cues  Skilled Therapeutic Interventions: Pt was seen for skilled ST targeting cognitive skills - specifically arousal and initiation. Pt was sleeping soundly and required Max A multimodal cues to arouse. She achieved eyes open for focused attention to visual stimuli on command X2, otherwise her eyes remained closed. Pt provided minimal verbal responses to questions, and although she did attempt to answer orientation questions, speech was unintelligible due to very low vocal intensity and no activation of articulators - she produced mostly vowel sounds and therapist was unable to decipher her utterances. Pt initiated in 1 self care task, however required overall Mod A verbal and physical assistance to wash left side of her face. Max A provided for repositioning pt to maintain sitting in center of bed (as opposed to laying with heavy left lean). Pt eventually stopped providing verbal responses and unable to maintain alertness for further participation. She missed 45 mins skilled ST - will attempt to  see pt later today if schedule allows. Pt left laying in bed with alarm set and needs within reach. Continue per current plan of care.        Pain Pain Assessment Pain Scale: Faces Faces Pain Scale: No hurt Pain Type: Acute pain Pain Location: Head Pain Orientation: Anterior Pain Descriptors / Indicators: Grimacing Pain Intervention(s): Repositioned;Emotional support  Therapy/Group: Individual Therapy  Arbutus Leas 06/17/2019, 11:17 AM

## 2019-06-17 NOTE — Progress Notes (Signed)
Physical Therapy Session Note  Patient Details  Name: Tanya Knight MRN: [id number] Date of Birth: 1941/03/02  Today's Date: 06/17/2019 PT Individual Time: 1400-1416 PT Individual Time Calculation (min): 16 min   and  Today's Date: 06/17/2019 PT Missed Time: 44 Minutes Missed Time Reason: Patient fatigue(lethargy)  Short Term Goals: Week 1:  PT Short Term Goal 1 (Week 1): Pt will participate in static standing balance with modA PT Short Term Goal 2 (Week 1): Pt will demonstrate improved attention to task requiring stimulation <50% during session PT Short Term Goal 3 (Week 1): Pt will perform sit<>stand transfer with minA PT Short Term Goal 4 (Week 1): Pt will perform dynamic sitting balance with minA PT Short Term Goal 5 (Week 1): Pt will initiate gait training  Skilled Therapeutic Interventions/Progress Updates:   Pt received supine in bed poorly positioned and in a deep sleep with mouth breathing. Therapist attempted verbal and tactile stimulus to arouse pt but she continued to remain asleep with eyes closed. Therapist donned BLE TED hose total assist for DVT prophylaxis. RN present for IV medication management and reports pt was able to open eyes earlier today with stimulus. Performed supine>sit EOB total assist for B LE management and trunk upright. Static sitting EOB required max assist due to posterior trunk lean with therapist manually facilitating improved upright trunk posture allowing progression to mod assist. Therapist continuing to provide verbal stimulus but pt remained with eyes closed though would have 1word response to therapist's question with increased time. Pt requesting to rest at this time. Sit>supine with +2 total assist for trunk descent and B LE management. Supine scoot towards HOB with +2 total assist and pt therapeutically positioned in supine. Left with needs in reach, lines intact, and bed alarm on. Missed 44 minutes of skilled physical therapy.   Therapist spoke  with Linna Hoff, [place] primary OT and ST regarding pt's lethargy limiting participation with therapy and determined to adjust pt's POC to15/7 at this time.  Therapy Documentation Precautions:  Precautions Precautions: Fall Precaution Comments: dizziness, diplopia, elevated BP Restrictions Weight Bearing Restrictions: No  Pain: No indications of pain.   Therapy/Group: Individual Therapy  Tawana Scale, PT, DPT 06/17/2019, 2:22 PM

## 2019-06-17 NOTE — Plan of Care (Signed)
  Problem: Consults Goal: RH STROKE PATIENT EDUCATION Description: See Patient Education module for education specifics  Outcome: Progressing   Problem: RH BOWEL ELIMINATION Goal: RH STG MANAGE BOWEL W/MEDICATION W/ASSISTANCE Description: STG Manage Bowel with Medication with min Assistance. Outcome: Progressing   Problem: RH SKIN INTEGRITY Goal: RH STG SKIN FREE OF INFECTION/BREAKDOWN Description: Patients skin will remain free from further infection or breakdown with min assist. Outcome: Progressing Goal: RH STG MAINTAIN SKIN INTEGRITY WITH ASSISTANCE Description: STG Maintain Skin Integrity With min Assistance. Outcome: Progressing Goal: RH STG ABLE TO PERFORM INCISION/WOUND CARE W/ASSISTANCE Description: STG Able To Perform Incision/Wound Care With min/mod Assistance. Outcome: Progressing   Problem: RH SAFETY Goal: RH STG ADHERE TO SAFETY PRECAUTIONS W/ASSISTANCE/DEVICE Description: STG Adhere to Safety Precautions With min Assistance/Device. Outcome: Progressing   Problem: RH KNOWLEDGE DEFICIT Goal: RH STG INCREASE KNOWLEDGE OF HYPERTENSION Description: Patient/caregiver will verbalize understanding of management of HTN including diet, exercise, medications, monitoring, and follow up care with min assist. Outcome: Progressing Goal: RH STG INCREASE KNOWLEGDE OF HYPERLIPIDEMIA Description: Patient/caregiver will verbalize understanding of management of HLD including diet, exercise, medications, monitoring, and follow up care with min assist. Outcome: Progressing Goal: RH STG INCREASE KNOWLEDGE OF STROKE PROPHYLAXIS Description: Patient/caregiver will verbalize understanding of management of stroke prophylaxis including diet, exercise, medications, monitoring, and follow up care with min assist. Outcome: Progressing

## 2019-06-18 ENCOUNTER — Inpatient Hospital Stay (HOSPITAL_COMMUNITY): Payer: Medicare PPO | Admitting: Physical Therapy

## 2019-06-18 ENCOUNTER — Inpatient Hospital Stay (HOSPITAL_COMMUNITY): Payer: Medicare PPO | Admitting: Occupational Therapy

## 2019-06-18 LAB — BASIC METABOLIC PANEL
Anion gap: 12 (ref 5–15)
Anion gap: 9 (ref 5–15)
BUN: 10 mg/dL (ref 8–23)
BUN: 8 mg/dL (ref 8–23)
CO2: 21 mmol/L — ABNORMAL LOW (ref 22–32)
CO2: 25 mmol/L (ref 22–32)
Calcium: 8.6 mg/dL — ABNORMAL LOW (ref 8.9–10.3)
Calcium: 8.5 mg/dL — ABNORMAL LOW (ref 8.9–10.3)
Chloride: 98 mmol/L (ref 98–111)
Chloride: 99 mmol/L (ref 98–111)
Creatinine, Ser: 0.51 mg/dL (ref 0.44–1.00)
Creatinine, Ser: 0.53 mg/dL (ref 0.44–1.00)
GFR calc Af Amer: >60 mL/min (ref >60)
GFR calc Af Amer: >60 mL/min (ref >60)
GFR calc non Af Amer: >60 mL/min (ref >60)
GFR calc non Af Amer: >60 mL/min (ref >60)
Glucose, Bld: 135 mg/dL — ABNORMAL HIGH (ref 70–99)
Glucose, Bld: 110 mg/dL — ABNORMAL HIGH (ref 70–99)
Potassium: 2.4 mmol/L — CL (ref 3.5–5.1)
Potassium: 2.9 mmol/L — ABNORMAL LOW (ref 3.5–5.1)
Sodium: 131 mmol/L — ABNORMAL LOW (ref 135–145)
Sodium: 133 mmol/L — ABNORMAL LOW (ref 135–145)

## 2019-06-18 MED ORDER — SODIUM CHLORIDE 0.9 % IV SOLN
INTRAVENOUS | Status: DC
  Filled 2019-06-18 (×3): qty 1000

## 2019-06-18 MED ORDER — TOPIRAMATE 25 MG PO TABS
25.0000 mg | ORAL_TABLET | Freq: Every day | ORAL | Status: DC
  Administered 2019-06-18 – 2019-06-19 (×2): 25 mg via ORAL
  Filled 2019-06-18 (×2): qty 1

## 2019-06-18 MED ORDER — SODIUM CHLORIDE 0.9 % IV SOLN: INTRAVENOUS | Status: DC

## 2019-06-18 MED ORDER — POTASSIUM CHLORIDE CRYS ER 20 MEQ PO TBCR
40.0000 meq | EXTENDED_RELEASE_TABLET | Freq: Three times a day (TID) | ORAL | Status: AC
  Administered 2019-06-18 – 2019-06-19 (×3): 40 meq via ORAL
  Filled 2019-06-18 (×3): qty 2

## 2019-06-18 NOTE — Progress Notes (Signed)
Physical Therapy Session Note  Patient Details  Name: Tanya Knight MRN: [id number] Date of Birth: 1940-10-06  Today's Date: 06/18/2019 PT Individual Time: [phone] and [id number] PT Individual Time Calculation (min): 56 min and 39 min    Short Term Goals: Week 1:  PT Short Term Goal 1 (Week 1): Pt will participate in static standing balance with modA PT Short Term Goal 2 (Week 1): Pt will demonstrate improved attention to task requiring stimulation <50% during session PT Short Term Goal 3 (Week 1): Pt will perform sit<>stand transfer with minA PT Short Term Goal 4 (Week 1): Pt will perform dynamic sitting balance with minA PT Short Term Goal 5 (Week 1): Pt will initiate gait training  Skilled Therapeutic Interventions/Progress Updates:    Session 1: Pt received supine in bed with nursing staff present. Pt able to open eyes and maintain alertness this morning responding to therapist's questions with pt agreeable to session stating "I don't want to but I should" though she still appears drowsy. Pt noted to be incontinent of bladder. Rolling R/L in bed using bedrails with min assist while therapist performed total assist LB clothing management and peri-care. Supine in bed donned pants max assist for threading LEs then manually facilitated hook-lying position for pt to bridge and assist with pulling pants over hips. Supine>sitting EOB with mod assist for B LE management and trunk upright. Able to sit EOB with min/mod assist due to posterior lean. R stand/squat pivot transfer to w/c with mod assist and therapist monitoring B LE knee buckling. Pt agreeable to remain seated up in w/c; however, therapist does not feel pt safe in normal w/c due to significant lethargy the past few days - attempted to retrieve TIS w/c to allow OOB activity tolerance; however, no access to TIS w/c available at this time. Transported pt back to room and pt performed seated oral care in w/c with manual facilitation for anterior  trunk lean and mod assist for set-up. L squat pivot w/c>EOB with mod assist for lifting/pivoting hips and again therapist monitoring knee buckling. Sit>supine with min/mod assist for trunk descent and B LE management. Pt left supine in bed with needs in reach and bed alarm on.  Session 2: Pt receive supine in bed with her son, Pilar Plate, present and pt is still alert at this time with eyes open though increased drowsiness. Pt agreeable to therapy session. Continues to report low back and headache pain - RN notified for medication administration. Pt's son reports prior to hospitalization pt was very active going to multiple group exercise classes, walking, and going to the [employer] at least 4-5x/week and was completely independent with all activities - pt's son reports she lives at home with her husband who will be her 24hr support (he is ambulatory with a cane) and that they have a 1story home with 3STE B HRs (though too wide to reach simultaneously). Supine>sitting EOB with max multimodal cuing and hand-over-hand assist to initiate the task with mod assist for B LE management and trunk upright. Sitting EOB with mod/max assist for trunk control due to posterior lean pt reports increase in headache pain therefore returned to supine to assess vitals with mod assist for trunk descent and B LE management. Supine vitals: BP 139/84 (MAP 101), HR 70bpm. RN present for medication administration therefore pt returned to sitting EOB as described above. Therapist provided max assist for trunk control while sitting EOB due to posterior lean and vitals reassessed BP 121/82 (MAP 93), HR 82bpm Pt  then demonstrating increasing fatigue with no longer holding eyes open and upon questioning pt requests to rest at this time - returned to supine as described above. Therapist educated pt's son regarding her CLOF as well as CIR information regarding daily therapy schedule with current 15/7 POC and weekly team meetings. Pt left supine in bed  with needs in reach, lines intact, and bed alarm on.    Therapy Documentation Precautions:  Precautions Precautions: Fall Precaution Comments: dizziness, diplopia, elevated BP Restrictions Weight Bearing Restrictions: No  Pain:   Session 1: Repots low back and headache pain, unrated but reporting this frequently throughout session, - RN notified and present for medication administration - therapist provided repositioning and rests for pain management.   Session 2: Reports low back and headache pain rated 9/10 with headache pain worsening upon sitting EOB - RN notified and present for medication administration - therapist provided repositioning and rests for pain management.    Therapy/Group: Individual Therapy  Tawana Scale, PT, DPT 06/18/2019, 7:55 AM

## 2019-06-18 NOTE — Progress Notes (Addendum)
Occupational Therapy Session Note  Patient Details  Name: Tanya Knight MRN: [id number] Date of Birth: 1941/03/21  Today's Date: 06/18/2019 OT Individual Time: 1450-1631 OT Individual Minutes = 101 minutes    Short Term Goals: Week 1:  OT Short Term Goal 1 (Week 1): Pt will maintain sustained attention for 30 mins with no more than min instructional cueing during ADL tasks. OT Short Term Goal 2 (Week 1): Pt will complete UB selfcare in sitting with supervision. OT Short Term Goal 3 (Week 1): Pt will complete LB dressing sit to stand with mod assist. OT Short Term Goal 4 (Week 1): Pt will complete toilet transfers stand pivot with the RW with min assist.  Skilled Therapeutic Interventions/Progress Updates: Inititally patient extremely difficult to engaged and kept eyes closed and dosed in and out.   She complained of "sometimes" double vision but no dizziness this session.     She was able to assist with peribathing to doff soaked brief and wash her front periarea.   She was engaged long enough to only require min A for lateral rolls for further cleansing and to don dry brief.   After re educated on patch wear (patient and son) and after she allowed patch to be donned, she was able to participate in session actively physically for at least 30 minutes.   She was able to engage sustained attentions for 6 minutes at a time.  She did require prompting at times to reengage and stay awake.              ~She stated she did not sleep last night.           Likely no sleep affected patient's ability to stay awake, orientated and engaged.  Otherwise the focus of session:  lying supine to bed of bed transfer=moderate assistance with extra time to process the task   Static sitting balance initially required moderate physical assist and cues to focus on an item away to maintain non diplopia and so as not to get dizzy.   She was unaware initially that her feet were off the floor and that she was holding  knees flexed but not touching feet to floor.   With a tactile cue she was able to correct posture and touch feet to floor and maintain balance with min A.   After stabilizing feet on floor was able to maintain both static and dynamic balance for 5 minutes at a time before either leaning left laterally or posteriorally.  She was able right and self correct with a tactile cue if sh eleaned left, but with posterior leaning, she required moderate assistance to come back to midline.  Oral care she was able to cognitively process the complete task and practice her visual perceptual skills to locat eth eitems and correctly unloose paste and apply to toothbrush.  She was positioned on her side and with both legs slightly elvated with a pillow between to ease her back pain.  She required moderate asisstance for bed mobility as she began to fall back to sleep.  She complained of her back hurting and at the end of the session this clinician applied therapy grade hot packs for 20 minutes and monitored every 5 minutes.     At the end of the session, her bed alarm was engaged; her son was seated in the recliner next to her, she demonstrated use and understanding of using the call bell if sh eneeded to void or if her brief was  wet.  Continue OT Plan of Care     Therapy Documentation Precautions:  Precautions Precautions: Fall Precaution Comments: dizziness, diplopia, elevated BP Restrictions Weight Bearing Restrictions: No  Pain:9/10 in both back and head.   RN had given pain meds and this clinician applied therapeutic hot packs at the end of the session     Therapy/Group: Individual Therapy  [facility] [place] 06/18/2019, 4:52 PM

## 2019-06-18 NOTE — Progress Notes (Signed)
[facility] PHYSICAL MEDICINE & REHABILITATION PROGRESS NOTE   Subjective/Complaints:  Pt's tolerating only 1 hour/day of therapy per PT; 2nd time out of bed total since admission- pt reports her head and back are still hurting- just had pain meds.   Sitting up at sink with head lolling back and forth.    K+ 2.4!!  ROS- no CP, SOB, no abd pain  Objective:   No results found. No results for input(s): WBC, HGB, HCT, PLT in the last 72 hours. Recent Labs    06/17/19 0810 06/18/19 1018  NA 125* 131*  K 2.6* 2.4*  CL 88* 98  CO2 27 21*  GLUCOSE 116* 135*  BUN 10 10  CREATININE 0.43* 0.51  CALCIUM 9.1 8.6*    Intake/Output Summary (Last 24 hours) at 06/18/2019 1448 Last data filed at 06/18/2019 1302 Gross per 24 hour  Intake 1257.48 ml  Output -  Net 1257.48 ml     Physical Exam: Vital Signs Blood pressure 121/82, pulse 82, temperature (!) 97.3 F (36.3 C), temperature source Oral, resp. rate 16, weight 55.8 kg, SpO2 99 %. General: No acute distress, lethargic, opens eyes to stimulation.  Mood and affect are appropriate Heart: Regular rate and rhythm no rubs murmurs or extra sounds Lungs: Clear to auscultation, breathing unlabored, no rales or wheezes Abdomen: Positive bowel sounds, soft nontender to palpation, nondistended Extremities: No clubbing, cyanosis, or edema Skin: No evidence of breakdown, no evidence of rash Neurologic: Cranial nerves II through XII intact, motor strength is 5/5 in bilateral deltoid, bicep, tricep, grip, hip flexor, knee extensors, ankle dorsiflexor and plantar flexor Sensory exam normal sensation to light touch and proprioception in bilateral upper and lower extremities Cerebellar exam normal finger to nose to finger as well as heel to shin in bilateral upper and lower extremities Musculoskeletal: Full range of motion in all 4 extremities. No joint swelling   Assessment/Plan: 1. Functional deficits secondary to Cerebellar vermis ICH which  require 3+ hours per day of interdisciplinary therapy in a comprehensive inpatient rehab setting.  Physiatrist is providing close team supervision and 24 hour management of active medical problems listed below.  Physiatrist and rehab team continue to assess barriers to discharge/monitor patient progress toward functional and medical goals  Care Tool:  Bathing    Body parts bathed by patient: Face   Body parts bathed by helper: Front perineal area, Buttocks Body parts n/a: Right upper leg, Left upper leg, Right lower leg, Left lower leg, Abdomen, Chest, Left arm, Right arm(did not attempt)   Bathing assist Assist Level: Maximal Assistance - Patient 24 - 49%     Upper Body Dressing/Undressing Upper body dressing   What is the patient wearing?: Pull over shirt    Upper body assist Assist Level: Total Assistance - Patient < 25%    Lower Body Dressing/Undressing Lower body dressing      What is the patient wearing?: Incontinence brief, Pants     Lower body assist Assist for lower body dressing: Dependent - Patient 0%     Toileting Toileting    Toileting assist Assist for toileting: Moderate Assistance - Patient 50 - 74%     Transfers Chair/bed transfer  Transfers assist  Chair/bed transfer activity did not occur: Safety/medical concerns  Chair/bed transfer assist level: Moderate Assistance - Patient 50 - 74%     Locomotion Ambulation   Ambulation assist   Ambulation activity did not occur: Safety/medical concerns          Walk  10 feet activity   Assist  Walk 10 feet activity did not occur: Safety/medical concerns        Walk 50 feet activity   Assist Walk 50 feet with 2 turns activity did not occur: Safety/medical concerns         Walk 150 feet activity   Assist Walk 150 feet activity did not occur: Safety/medical concerns         Walk 10 feet on uneven surface  activity   Assist Walk 10 feet on uneven surfaces activity did not  occur: Safety/medical concerns         Wheelchair     Assist Will patient use wheelchair at discharge?: No(Not anticipated; no LT goals set)   Wheelchair activity did not occur: Safety/medical concerns         Wheelchair 50 feet with 2 turns activity    Assist    Wheelchair 50 feet with 2 turns activity did not occur: Safety/medical concerns       Wheelchair 150 feet activity     Assist  Wheelchair 150 feet activity did not occur: Safety/medical concerns       Blood pressure 121/82, pulse 82, temperature (!) 97.3 F (36.3 C), temperature source Oral, resp. rate 16, weight 55.8 kg, SpO2 99 %.    1.  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  Looks more lethargic than on Monday during consult , check urine, CXR, CT head, no sedating meds , may need sleep graph Spoke with husband and updated him.  2/4: Linna Hoff, PA, updated patient's son today and daughter yesterday. He has also been in communication with surgeon who says that CT is stable and it will take time for hemorrhage to resorb.              -patient may  shower             -ELOS/Goals: ~ 2.5 to 3 weeks/ Goals CGA 2.  Antithrombotics: -DVT/anticoagulation: Subcutaneous heparin initiated 06/11/2019             -antiplatelet therapy: N/A 3. Pain Management: Continue Tramadol as needed, Fioricet as needed for headaches 4. Mood: Provide emotional support             -antipsychotic agents: N/A 5. Neuropsych: This patient is NOT capable of making decisions on her own behalf. 6. Skin/Wound Care: Continue routine skin checks 7. Fluids/Electrolytes/Nutrition: Routine in and outs with follow-up chemistries 8.  Hypertension.  Norvasc 10 mg daily, Toprol-XL 50 mg daily.  Monitor with increased mobility- monitor closely.    Vitals:   06/18/19 1332 06/18/19 1336  BP: 139/84  121/82  Pulse: 70 82  Resp:    Temp:    SpO2:     9.  History of seizure disorder.  Continue Depakote 500 mg every 12 hours For old seizure d/o as well as prevention of new seizures 10. Diplopia- suggest eye patch intermittently to help with double vision 11. Hypokalemia- last K+ 2.6- will supplement with K CL IV riders  will monitor, also with mild hyponatremia , not on diuretics   2/6- gave KCl 40 mEq x3 in 24 hours and also 10 mEq hourly by IVFs  -will recheck today and tomorrow.  Hyponatremia- likely related to IVF will change from .45 NS to .9 NS - expect gradual improvement , recheck BMET in am   2/6- Na up to  131- doing better- con't NS IVFs BMP Latest Ref Rng & Units 06/18/2019 06/17/2019 06/15/2019  Glucose 70 - 99 mg/dL 135(H) 116(H) 123(H)  BUN 8 - 23 mg/dL 10 10 14   Creatinine 0.44 - 1.00 mg/dL 0.51 0.43(L) 0.47  Sodium 135 - 145 mmol/L 131(L) 125(L) 130(L)  Potassium 3.5 - 5.1 mmol/L 2.4(LL) 2.6(LL) 3.5  Chloride 98 - 111 mmol/L 98 88(L) 92(L)  CO2 22 - 32 mmol/L 21(L) 27 25  Calcium 8.9 - 10.3 mg/dL 8.6(L) 9.1 9.3    12. Severe daily headaches due to ICH-add Topamax 13. Nausea/+/- vomiting- suggest zofran since less sedating- although can cause constipation- try prn, or scheduled before breakfast to help with nausea.  2/6- still having daily HA's - con't meds for now.  14. Dysphagia due to CVA   LOS: 4 days A FACE TO FACE EVALUATION WAS PERFORMED  Laine Giovanetti 06/18/2019, 2:48 PM

## 2019-06-19 ENCOUNTER — Inpatient Hospital Stay (HOSPITAL_COMMUNITY): Payer: Medicare PPO | Admitting: Speech Pathology

## 2019-06-19 LAB — BASIC METABOLIC PANEL
Anion gap: 8 (ref 5–15)
BUN: 5 mg/dL — ABNORMAL LOW (ref 8–23)
CO2: 24 mmol/L (ref 22–32)
Calcium: 8.5 mg/dL — ABNORMAL LOW (ref 8.9–10.3)
Chloride: 98 mmol/L (ref 98–111)
Creatinine, Ser: 0.47 mg/dL (ref 0.44–1.00)
GFR calc Af Amer: >60 mL/min (ref >60)
GFR calc non Af Amer: >60 mL/min (ref >60)
Glucose, Bld: 104 mg/dL — ABNORMAL HIGH (ref 70–99)
Potassium: 2.7 mmol/L — CL (ref 3.5–5.1)
Sodium: 130 mmol/L — ABNORMAL LOW (ref 135–145)

## 2019-06-19 MED ORDER — POTASSIUM CHLORIDE CRYS ER 20 MEQ PO TBCR
40.0000 meq | EXTENDED_RELEASE_TABLET | Freq: Three times a day (TID) | ORAL | Status: AC
  Administered 2019-06-19 (×2): 40 meq via ORAL
  Filled 2019-06-19 (×2): qty 2

## 2019-06-19 MED ORDER — MAGNESIUM OXIDE 400 (241.3 MG) MG PO TABS
400.0000 mg | ORAL_TABLET | Freq: Two times a day (BID) | ORAL | Status: DC
  Administered 2019-06-19 – 2019-06-20 (×3): 400 mg via ORAL
  Filled 2019-06-19 (×3): qty 1

## 2019-06-19 NOTE — Progress Notes (Signed)
Speech Language Pathology Daily Session Note  Patient Details  Name: Tanya Knight MRN: [id number] Date of Birth: [birth year]  Today's Date: 06/19/2019 SLP Individual Time: 0949-1000 SLP Individual Time Calculation (min): 11 min  Short Term Goals: Week 1: SLP Short Term Goal 1 (Week 1): (not able to target at this time due to severe deficits and lethargy) SLP Short Term Goal 1 - Progress (Week 1): Discontinued (comment) SLP Short Term Goal 2 (Week 1): Pt will recall new and daily information with Min A verbal/visual cues for use of compensatory strategies. SLP Short Term Goal 3 (Week 1): Pt will provide intelligible word and phrase level verbal responses to questions in 50% opportunities with Mod A cues. SLP Short Term Goal 3 - Progress (Week 1): Revised due to lack of progress SLP Short Term Goal 4 (Week 1): Pt will consume current diet with minimal overt s/sx aspiration and demonstrate efficient mastication and oral clearance with Mod A verbal/visual cues SLP Short Term Goal 5 (Week 1): Pt will initiate during functional familiar tasks in 75% opportunities with Max A multimodal cues.  Skilled Therapeutic Interventions:  Pt was seen for skilled ST targeting dysphagia goals.  Upon arrival, pt was quite lethargic and had complaints of 9/10 headache.  She would frequently reach up and rub the right side of her face as she reported that was were the pain was greatest.  Despite pain, pt reported that she was "going to try" to participate in therapy and she was noted to make attempts to engage with therapist, although due to headache she had difficulty keeping her eyes open.  Blinds were closed due to pt's reports of sensitivity to light.  Pt was agreeable to a functional snack of dys 3 textures to monitor toleration of currently prescribed diet.  Pt consumed graham crackers and water via straw with no overt s/s of aspiration or residue remaining in the oral cavity post swallow, min cues needed for use  of swallowing precautions.  Pt politely declined further trials after consuming 1 graham cracker and appeared to become more lethargic.  As a result, session was ended early due to pt's pain and fatigue.  Pt was left in bed with bed alarm set and call bell within reach.  Continue per current plan of care.   Pain Pain Assessment Pain Scale: 0-10 Pain Score: 9  Pain Location: Head Pain Descriptors / Indicators: Headache Pain Intervention(s): Other (Comment)(limited activity to pt's comfort level)  Therapy/Group: Individual Therapy  Noel Henandez, Selinda Orion 06/19/2019, 12:38 PM

## 2019-06-19 NOTE — Progress Notes (Signed)
[facility] PHYSICAL MEDICINE & REHABILITATION PROGRESS NOTE   Subjective/Complaints:  Pt reports she's been incontinent of urine and feels uncomfortable in depends, due to being wet.  Headache not quite as bad today- saw that Topamax was supposed to be ordered, but wasn't- ordered yesterday.  Since K+ won't stay up, will order Magnesium Oxide 400 mg BID, check Mg in AM as wlel as KCl 40 mEq x3 and recheck labs  In AM and weekly since not ordered.     ROS- no CP, SOB, no abd pain  Objective:   No results found. No results for input(s): WBC, HGB, HCT, PLT in the last 72 hours. Recent Labs    06/18/19 2036 06/19/19 0533  NA 133* 130*  K 2.9* 2.7*  CL 99 98  CO2 25 24  GLUCOSE 110* 104*  BUN 8 5*  CREATININE 0.53 0.47  CALCIUM 8.5* 8.5*    Intake/Output Summary (Last 24 hours) at 06/19/2019 0933 Last data filed at 06/19/2019 0700 Gross per 24 hour  Intake 2601.18 ml  Output --  Net 2601.18 ml     Physical Exam: Vital Signs Blood pressure (!) 162/88, pulse 74, temperature (!) 97.5 F (36.4 C), resp. rate 18, weight 55.8 kg, SpO2 99 %. General: No acute distress,  A little less lethargic, eyes open entire conversation; in bed  Mood and affect are appropriate Heart: Regular rate and rhythm no rubs murmurs or extra sounds Lungs: Clear to auscultation, breathing unlabored, no rales or wheezes Abdomen: Positive bowel sounds, soft nontender to palpation, nondistended Extremities: No clubbing, cyanosis, or edema Skin: No evidence of breakdown, no evidence of rash Neurologic: Cranial nerves II through XII intact, motor strength is 5/5 in bilateral deltoid, bicep, tricep, grip, hip flexor, knee extensors, ankle dorsiflexor and plantar flexor Sensory exam normal sensation to light touch and proprioception in bilateral upper and lower extremities Cerebellar exam normal finger to nose to finger as well as heel to shin in bilateral upper and lower extremities Musculoskeletal: Full  range of motion in all 4 extremities. No joint swelling   Assessment/Plan: 1. Functional deficits secondary to Cerebellar vermis ICH which require 3+ hours per day of interdisciplinary therapy in a comprehensive inpatient rehab setting.  Physiatrist is providing close team supervision and 24 hour management of active medical problems listed below.  Physiatrist and rehab team continue to assess barriers to discharge/monitor patient progress toward functional and medical goals  Care Tool:  Bathing    Body parts bathed by patient: Face   Body parts bathed by helper: Front perineal area, Buttocks Body parts n/a: Right upper leg, Left upper leg, Right lower leg, Left lower leg, Abdomen, Chest, Left arm, Right arm(did not attempt)   Bathing assist Assist Level: Maximal Assistance - Patient 24 - 49%     Upper Body Dressing/Undressing Upper body dressing   What is the patient wearing?: Pull over shirt    Upper body assist Assist Level: Total Assistance - Patient < 25%    Lower Body Dressing/Undressing Lower body dressing      What is the patient wearing?: Incontinence brief, Pants     Lower body assist Assist for lower body dressing: Dependent - Patient 0%     Toileting Toileting    Toileting assist Assist for toileting: Dependent - Patient 0%     Transfers Chair/bed transfer  Transfers assist  Chair/bed transfer activity did not occur: Safety/medical concerns  Chair/bed transfer assist level: Moderate Assistance - Patient 50 - 74%  Locomotion Ambulation   Ambulation assist   Ambulation activity did not occur: Safety/medical concerns          Walk 10 feet activity   Assist  Walk 10 feet activity did not occur: Safety/medical concerns        Walk 50 feet activity   Assist Walk 50 feet with 2 turns activity did not occur: Safety/medical concerns         Walk 150 feet activity   Assist Walk 150 feet activity did not occur: Safety/medical  concerns         Walk 10 feet on uneven surface  activity   Assist Walk 10 feet on uneven surfaces activity did not occur: Safety/medical concerns         Wheelchair     Assist Will patient use wheelchair at discharge?: No(Not anticipated; no LT goals set)   Wheelchair activity did not occur: Safety/medical concerns         Wheelchair 50 feet with 2 turns activity    Assist    Wheelchair 50 feet with 2 turns activity did not occur: Safety/medical concerns       Wheelchair 150 feet activity     Assist  Wheelchair 150 feet activity did not occur: Safety/medical concerns       Blood pressure (!) 162/88, pulse 74, temperature (!) 97.5 F (36.4 C), resp. rate 18, weight 55.8 kg, SpO2 99 %.    1.  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  Looks more lethargic than on Monday during consult , check urine, CXR, CT head, no sedating meds , may need sleep graph Spoke with husband and updated him.  2/4: Linna Hoff, PA, updated patient's son today and daughter yesterday. He has also been in communication with surgeon who says that CT is stable and it will take time for hemorrhage to resorb.              -patient may  shower             -ELOS/Goals: ~ 2.5 to 3 weeks/ Goals CGA 2.  Antithrombotics: -DVT/anticoagulation: Subcutaneous heparin initiated 06/11/2019             -antiplatelet therapy: N/A 3. Pain Management: Continue Tramadol as needed, Fioricet as needed for headaches 4. Mood: Provide emotional support             -antipsychotic agents: N/A 5. Neuropsych: This patient is NOT capable of making decisions on her own behalf. 6. Skin/Wound Care: Continue routine skin checks 7. Fluids/Electrolytes/Nutrition: Routine in and outs with follow-up chemistries 8.  Hypertension.  Norvasc 10 mg daily, Toprol-XL 50 mg daily.   Monitor with increased mobility- monitor closely.    Vitals:   06/19/19 0350 06/19/19 0846  BP: (!) 164/87 (!) 162/88  Pulse: 72 74  Resp: 18   Temp: (!) 97.5 F (36.4 C)   SpO2: 99%    9.  History of seizure disorder.  Continue Depakote 500 mg every 12 hours For old seizure d/o as well as prevention of new seizures 10. Diplopia- suggest eye patch intermittently to help with double vision 11. Hypokalemia- last K+ 2.6- will supplement with K CL IV riders  will monitor, also with mild hyponatremia , not on diuretics   2/6- gave KCl 40 mEq x3 in 24 hours and also 10 mEq hourly by IVFs  -will recheck today and tomorrow.  2/7- K+ up to 2.7- will give another KCl 40 mEq x3 and also add Mg level in AM, and Mg Oxide 400 mg BID since likely low, since K+ won't improve; labs in AM Hyponatremia- likely related to IVF will change from .45 NS to .9 NS - expect gradual improvement , recheck BMET in am   2/6- Na up to 131- doing better- con't NS IVFs  2/7- Na back down to 130- will con't IVFs and recheck in AM BMP Latest Ref Rng & Units 06/19/2019 06/18/2019 06/18/2019  Glucose 70 - 99 mg/dL 104(H) 110(H) 135(H)  BUN 8 - 23 mg/dL 5(L) 8 10  Creatinine 0.44 - 1.00 mg/dL 0.47 0.53 0.51  Sodium 135 - 145 mmol/L 130(L) 133(L) 131(L)  Potassium 3.5 - 5.1 mmol/L 2.7(LL) 2.9(L) 2.4(LL)  Chloride 98 - 111 mmol/L 98 99 98  CO2 22 - 32 mmol/L 24 25 21(L)  Calcium 8.9 - 10.3 mg/dL 8.5(L) 8.5(L) 8.6(L)    12. Severe daily headaches due to ICH-add Topamax 13. Nausea/+/- vomiting- suggest zofran since less sedating- although can cause constipation- try prn, or scheduled before breakfast to help with nausea.  2/6- still having daily HA's - con't meds for now.  2/7- saw hadn't added Topamax on 2/5, so added 2/6 in afternoon- HA slightly improved today.   14. Dysphagia due to CVA   LOS: 5 days A FACE TO FACE EVALUATION WAS PERFORMED  Arnecia Ector 06/19/2019, 9:33 AM

## 2019-06-19 NOTE — Significant Event (Signed)
CRITICAL VALUE ALERT  Critical Value:  Potassium 2.7  Date & Time Notified:  06/19/19-0653 hrs  Provider Notified: Dr. Dagoberto Ligas  Orders Received/Actions taken: MD informed

## 2019-06-20 ENCOUNTER — Inpatient Hospital Stay (HOSPITAL_COMMUNITY): Payer: Medicare PPO

## 2019-06-20 ENCOUNTER — Inpatient Hospital Stay (HOSPITAL_COMMUNITY): Payer: Medicare PPO | Admitting: Occupational Therapy

## 2019-06-20 ENCOUNTER — Inpatient Hospital Stay (HOSPITAL_COMMUNITY): Payer: Medicare PPO | Admitting: Speech Pathology

## 2019-06-20 LAB — CBC WITH DIFFERENTIAL/PLATELET
Abs Immature Granulocytes: 0.1 10*3/uL — ABNORMAL HIGH (ref 0.00–0.07)
Basophils Absolute: 0 10*3/uL (ref 0.0–0.1)
Basophils Relative: 0 %
Eosinophils Absolute: 0.2 10*3/uL (ref 0.0–0.5)
Eosinophils Relative: 2 %
HCT: 37.6 % (ref 36.0–46.0)
Hemoglobin: 13.3 g/dL (ref 12.0–15.0)
Immature Granulocytes: 1 %
Lymphocytes Relative: 13 %
Lymphs Abs: 1.7 10*3/uL (ref 0.7–4.0)
MCH: 30.2 pg (ref 26.0–34.0)
MCHC: 35.4 g/dL (ref 30.0–36.0)
MCV: 85.3 fL (ref 80.0–100.0)
Monocytes Absolute: 1.1 10*3/uL — ABNORMAL HIGH (ref 0.1–1.0)
Monocytes Relative: 9 %
Neutro Abs: 9.4 10*3/uL — ABNORMAL HIGH (ref 1.7–7.7)
Neutrophils Relative %: 75 %
Platelets: 286 10*3/uL (ref 150–400)
RBC: 4.41 MIL/uL (ref 3.87–5.11)
RDW: 13.2 % (ref 11.5–15.5)
WBC: 12.5 10*3/uL — ABNORMAL HIGH (ref 4.0–10.5)
nRBC: 0 % (ref 0.0–0.2)

## 2019-06-20 LAB — COMPREHENSIVE METABOLIC PANEL
ALT: 16 U/L (ref 0–44)
AST: 17 U/L (ref 15–41)
Albumin: 3.1 g/dL — ABNORMAL LOW (ref 3.5–5.0)
Alkaline Phosphatase: 51 U/L (ref 38–126)
Anion gap: 11 (ref 5–15)
BUN: <5 mg/dL — ABNORMAL LOW (ref 8–23)
CO2: 21 mmol/L — ABNORMAL LOW (ref 22–32)
Calcium: 8.9 mg/dL (ref 8.9–10.3)
Chloride: 99 mmol/L (ref 98–111)
Creatinine, Ser: 0.46 mg/dL (ref 0.44–1.00)
GFR calc Af Amer: >60 mL/min (ref >60)
GFR calc non Af Amer: >60 mL/min (ref >60)
Glucose, Bld: 108 mg/dL — ABNORMAL HIGH (ref 70–99)
Potassium: 3.6 mmol/L (ref 3.5–5.1)
Sodium: 131 mmol/L — ABNORMAL LOW (ref 135–145)
Total Bilirubin: 0.7 mg/dL (ref 0.3–1.2)
Total Protein: 5.5 g/dL — ABNORMAL LOW (ref 6.5–8.1)

## 2019-06-20 LAB — TSH: TSH: 1.421 u[IU]/mL (ref 0.350–4.500)

## 2019-06-20 LAB — OSMOLALITY: Osmolality: 275 mOsm/kg (ref 275–295)

## 2019-06-20 LAB — CORTISOL: Cortisol, Plasma: 7.8 ug/dL

## 2019-06-20 LAB — MAGNESIUM: Magnesium: 1.6 mg/dL — ABNORMAL LOW (ref 1.7–2.4)

## 2019-06-20 MED ORDER — LACTATED RINGERS IV SOLN: INTRAVENOUS | Status: DC

## 2019-06-20 MED ORDER — TOPIRAMATE 25 MG PO TABS
25.0000 mg | ORAL_TABLET | Freq: Two times a day (BID) | ORAL | Status: DC
  Administered 2019-06-20 – 2019-06-27 (×14): 25 mg via ORAL
  Filled 2019-06-20 (×14): qty 1

## 2019-06-20 MED ORDER — PRO-STAT SUGAR FREE PO LIQD
30.0000 mL | Freq: Two times a day (BID) | ORAL | Status: DC
  Administered 2019-06-20 – 2019-07-13 (×45): 30 mL via ORAL
  Filled 2019-06-20 (×45): qty 30

## 2019-06-20 MED ORDER — MAGNESIUM OXIDE 400 (241.3 MG) MG PO TABS
400.0000 mg | ORAL_TABLET | Freq: Three times a day (TID) | ORAL | Status: DC
  Administered 2019-06-20 – 2019-07-13 (×67): 400 mg via ORAL
  Filled 2019-06-20 (×67): qty 1

## 2019-06-20 NOTE — Consult Note (Addendum)
Reason for Consult: Hyponatremia, hypokalemia, hypomagnesemia Referring Physician: Alysia Penna MD (PM&R)  HPI:  79 year old Caucasian woman with past medical history significant for hypertension, seizure disorder, osteoporosis and GERD presented to the hospital 11 days ago with headache, nausea/vomiting, slurred speech and weakness resulting in a fall.  Stat CT scan of the head performed in the emergency room showed a 16 cm hemorrhage in the cerebellar vermis with subarachnoid and subdural extension.  She underwent evaluation with CT angiogram that did not show any aneurysm or arteriovenous malformation and echocardiogram that did not show source of emboli.  Following blood pressure control and plan for risk modification, she was discharged to CIR 6 days ago.  Concern is raised with hyponatremia, hypokalemia and hypomagnesemia that have been difficult to manage.  Her son who is available at bedside reports some changes in her mental status over the past week.  She does not have prior history of kidney injury, recurrent nephrolithiasis, recurrent UTIs or chronic NSAID use.  Past Medical History:  Diagnosis Date  . Fracture of metatarsal    Repair of fractured R metatarsal --Dr Duda---4/08  . GERD (gastroesophageal reflux disease)   . Hypertension   . Melanoma in situ [facility]) 7/17   Dr Kellie Moor  . Osteoporosis   . Seizure disorder ([place])   . Seizures ([place])   . Vitamin D deficiency     Past Surgical History:  Procedure Laterality Date  . CATARACT EXTRACTION, BILATERAL Bilateral 2014  . EYE SURGERY     Obstructed tear duct  . FOOT SURGERY  2008  . MELANOMA EXCISION Left 11/2015   left lower leg  . TEAR DUCT PROBING  10/12   temporary stent  . TONSILLECTOMY AND ADENOIDECTOMY  1948    Family History  Problem Relation Age of Onset  . Hypertension Mother   . Heart disease Father   . Breast cancer Daughter 38  . Diabetes Maternal Aunt   . Coronary artery disease Paternal Aunt   .  Breast cancer Paternal Aunt   . Coronary artery disease Paternal Uncle   . Heart disease Maternal Grandmother   . Heart disease Maternal Grandfather   . Heart disease Paternal Grandmother   . Heart disease Paternal Grandfather   . Colon cancer Neg Hx   . Stomach cancer Neg Hx   . Pancreatic cancer Neg Hx   . Rectal cancer Neg Hx     Social History:  reports that she has never smoked. She has never used smokeless tobacco. She reports current alcohol use. She reports that she does not use drugs.  Allergies:  Allergies  Allergen Reactions  . Phenytoin     Other reaction(s): Other (See Comments) Other Reaction: Other reaction REACTION: severe reaction    Medications:  Scheduled: . amLODipine  10 mg Oral Daily  . divalproex  500 mg Oral Q12H  . heparin  5,000 Units Subcutaneous BID  . magnesium oxide  400 mg Oral BID  . [START ON 06/21/2019] methylPREDNISolone (SOLU-MEDROL) injection  10 mg Intravenous Daily   Followed by  . [START ON 06/22/2019] methylPREDNISolone (SOLU-MEDROL) injection  10 mg Intravenous Daily  . metoprolol succinate  50 mg Oral Daily  . pantoprazole  40 mg Oral Daily  . senna-docusate  1 tablet Oral BID  . topiramate  25 mg Oral BID    BMP Latest Ref Rng & Units 06/20/2019 06/19/2019 06/18/2019  Glucose 70 - 99 mg/dL 108(H) 104(H) 110(H)  BUN 8 - 23 mg/dL <5(L) 5(L) 8  Creatinine 0.44 - 1.00 mg/dL 0.46 0.47 0.53  Sodium 135 - 145 mmol/L 131(L) 130(L) 133(L)  Potassium 3.5 - 5.1 mmol/L 3.6 2.7(LL) 2.9(L)  Chloride 98 - 111 mmol/L 99 98 99  CO2 22 - 32 mmol/L 21(L) 24 25  Calcium 8.9 - 10.3 mg/dL 8.9 8.5(L) 8.5(L)   CBC Latest Ref Rng & Units 06/20/2019 06/15/2019 06/14/2019  WBC 4.0 - 10.5 K/uL 12.5(H) 10.9(H) 9.8  Hemoglobin 12.0 - 15.0 g/dL 13.3 14.1 13.6  Hematocrit 36.0 - 46.0 % 37.6 41.1 40.2  Platelets 150 - 400 K/uL 286 356 298   No results found.  Review of Systems  Constitutional: Positive for appetite change and fatigue. Negative for unexpected  weight change.  HENT: Negative.   Eyes: Negative.   Respiratory: Negative.   Cardiovascular: Negative.   Gastrointestinal: Positive for nausea. Negative for blood in stool, constipation, diarrhea and vomiting.  Endocrine: Negative for cold intolerance, polydipsia and polyphagia.  Genitourinary: Negative for dysuria, flank pain and hematuria.  Musculoskeletal: Positive for back pain.  Skin: Negative for pallor and rash.  Neurological: Positive for headaches. Negative for dizziness and facial asymmetry.  Psychiatric/Behavioral: Positive for decreased concentration.   Blood pressure (!) 156/78, pulse 61, temperature 98.2 F (36.8 C), resp. rate 16, weight 55.8 kg, SpO2 100 %. Physical Exam  Nursing note and vitals reviewed. Constitutional: She is oriented to person, place, and time. She appears well-developed and well-nourished.  Lethargic but awakens to calling out her name and attempts to engage in some conversation-limited  HENT:  Head: Normocephalic and atraumatic.  Nose: Nose normal.  Dry oral mucosa/oropharynx  Eyes: Pupils are equal, round, and reactive to light. Conjunctivae and EOM are normal. No scleral icterus.  Neck: No JVD present.  Limited range of motion  Cardiovascular: Normal rate, regular rhythm and normal heart sounds.  No murmur heard. Respiratory: Effort normal and breath sounds normal. She has no wheezes. She has no rales.  GI: Soft. Bowel sounds are normal. There is no abdominal tenderness. There is no rebound and no guarding.  Musculoskeletal:        General: No edema.     Cervical back: Neck supple.  Neurological: She is oriented to person, place, and time.  Skin: Skin is warm and dry. No rash noted. No pallor.    Assessment/Plan: 1.  Hyponatremia: This appears to be euvolemic to possibly slightly hypovolemic.  We will switch her from normal saline to balanced isotonic fluid to help replace bicarbonate/potassium as well.  I will check a TSH level, cortisol  level along with serum osmolality and urine osmolality.  Continue to optimize protein/caloric intake.  Unclear if she needs repeat CT scan of the head as the one from last week did not show any hydrocephalus or extension of intracranial bleed.  I suspect that she has an element of non-osmotically mediated ADH release from pain and nausea. 2.  Hypokalemia: Suspected to be total body deficit likely in the setting of poor oral intake and increased sodium delivery to distal tubal from ongoing normal saline.  We will switch to lactated Ringer's and begin low-dose ARB for hypertension management if blood pressures remain persistently elevated with pain management. 3.  Hypomagnesemia: Likely total body deficit compounded by ongoing PPI use (chronic).  Will adjust oral potassium supplementation. 4.  Hypertension: Elevated blood pressures noted on amlodipine and metoprolol.  Will start low-dose ARB if pain management does not result in good control. 5.  Status post hemorrhagic stroke: Continue efforts at blood  pressure control with addition of ARB.  Currently in CIR.  Aurthur Wingerter K. 06/20/2019, 2:21 PM

## 2019-06-20 NOTE — Plan of Care (Signed)
  Problem: Consults Goal: RH STROKE PATIENT EDUCATION Description: See Patient Education module for education specifics  Outcome: Progressing   Problem: RH BOWEL ELIMINATION Goal: RH STG MANAGE BOWEL WITH ASSISTANCE Description: STG Manage Bowel with min Assistance. Outcome: Progressing Goal: RH STG MANAGE BOWEL W/MEDICATION W/ASSISTANCE Description: STG Manage Bowel with Medication with min Assistance. Outcome: Progressing   Problem: RH BLADDER ELIMINATION Goal: RH STG MANAGE BLADDER WITH ASSISTANCE Description: STG Manage Bladder With min Assistance Outcome: Progressing   Problem: RH SKIN INTEGRITY Goal: RH STG SKIN FREE OF INFECTION/BREAKDOWN Description: Patients skin will remain free from further infection or breakdown with min assist. Outcome: Progressing Goal: RH STG MAINTAIN SKIN INTEGRITY WITH ASSISTANCE Description: STG Maintain Skin Integrity With min Assistance. Outcome: Progressing   Problem: RH SAFETY Goal: RH STG ADHERE TO SAFETY PRECAUTIONS W/ASSISTANCE/DEVICE Description: STG Adhere to Safety Precautions With min Assistance/Device. Outcome: Progressing   Problem: RH KNOWLEDGE DEFICIT Goal: RH STG INCREASE KNOWLEDGE OF HYPERTENSION Description: Patient/caregiver will verbalize understanding of management of HTN including diet, exercise, medications, monitoring, and follow up care with min assist. Outcome: Progressing Goal: RH STG INCREASE KNOWLEGDE OF HYPERLIPIDEMIA Description: Patient/caregiver will verbalize understanding of management of HLD including diet, exercise, medications, monitoring, and follow up care with min assist. Outcome: Progressing Goal: RH STG INCREASE KNOWLEDGE OF STROKE PROPHYLAXIS Description: Patient/caregiver will verbalize understanding of management of stroke prophylaxis including diet, exercise, medications, monitoring, and follow up care with min assist. Outcome: Progressing   Problem: RH SKIN INTEGRITY Goal: RH STG ABLE TO  PERFORM INCISION/WOUND CARE W/ASSISTANCE Description: STG Able To Perform Incision/Wound Care With min/mod Assistance. Outcome: Not Progressing

## 2019-06-20 NOTE — Progress Notes (Signed)
Occupational Therapy Session Note  Patient Details  Name: Tanya Knight MRN: [id number] Date of Birth: 16-Dec-1940  Today's Date: 06/20/2019 OT Individual Time: [id number] OT Individual Time Calculation (min): 49 min    Short Term Goals: Week 1:  OT Short Term Goal 1 (Week 1): Pt will maintain sustained attention for 30 mins with no more than min instructional cueing during ADL tasks. OT Short Term Goal 2 (Week 1): Pt will complete UB selfcare in sitting with supervision. OT Short Term Goal 3 (Week 1): Pt will complete LB dressing sit to stand with mod assist. OT Short Term Goal 4 (Week 1): Pt will complete toilet transfers stand pivot with the RW with min assist.  Skilled Therapeutic Interventions/Progress Updates:    Pt alert but in bed to start session.  She reported headache and some back pain when asked as well as some dizziness.  She was not able to accurately rate the dizziness on a 0-10 point scale.  She did not report any diplopia when asked, but did exhibit overshooting when reaching for items at the sink.  Mod assist for transfer to the right side of the bed and for stand pivot transfer to the wheelchair.  She worked on washing her face with setup as well as oral hygiene with min assist.  She needed mod assist to flex her trunk forward out of the back of the chair for reaching the water to turn it on and off or to reach the soap.  When attempting to turn the water on to rinse out her toothbrush, she was reaching toward the mirror and no where near the handle for the cold water.  Also at times pt with right eye closed as well.  She was oriented to place this session and reason for being in the hospital, but she was not aware of the day of the week.  Finished session with pt sitting up in the wheelchair with safety belt in place and call button and phone in reach.  Encouraged pt to maintain sitting up until PT as she has not been out of the bed for any extended period the last week.     Therapy Documentation Precautions:  Precautions Precautions: Fall Precaution Comments: diplopia Restrictions Weight Bearing Restrictions: No  Pain: Pain Assessment Pain Scale: Faces Faces Pain Scale: Hurts little more Pain Type: Acute pain Pain Location: Head Pain Orientation: Right Pain Descriptors / Indicators: Headache Pain Onset: On-going Pain Intervention(s): Repositioned;Emotional support ADL: See Care Tool Section for some details of mobility and selfcare  Therapy/Group: Individual Therapy  Jacquie Lukes OTR/L 06/20/2019, 12:11 PM

## 2019-06-20 NOTE — Progress Notes (Signed)
Speech Language Pathology Daily Session Note  Patient Details  Name: Tanya Knight MRN: [id number] Date of Birth: 1940/06/25  Today's Date: 06/20/2019 SLP Individual Time: 1400-1430 SLP Individual Time Calculation (min): 30 min  Short Term Goals: Week 1: SLP Short Term Goal 1 (Week 1): (not able to target at this time due to severe deficits and lethargy) SLP Short Term Goal 1 - Progress (Week 1): Discontinued (comment) SLP Short Term Goal 2 (Week 1): Pt will recall new and daily information with Min A verbal/visual cues for use of compensatory strategies. SLP Short Term Goal 3 (Week 1): Pt will provide intelligible word and phrase level verbal responses to questions in 50% opportunities with Mod A cues. SLP Short Term Goal 3 - Progress (Week 1): Revised due to lack of progress SLP Short Term Goal 4 (Week 1): Pt will consume current diet with minimal overt s/sx aspiration and demonstrate efficient mastication and oral clearance with Mod A verbal/visual cues SLP Short Term Goal 5 (Week 1): Pt will initiate during functional familiar tasks in 75% opportunities with Max A multimodal cues.  Skilled Therapeutic Interventions: Pt was seen for skilled ST targeting dysphagia and cognitive goals. Pt visibly fatigued but arousable to medium volume voice and repositioning to upright position. She responded to Max A cues to open her eyes and although she required physical assist for completion she initiated in 2/2 opportunities to assist in shifting her position in bed. She consumed thin liquid via straw without overt s/sx aspiration, however Moderate verbal cues required for use of slow rate, small sips. Pt with increasing lethargy throughout session, which limited her ability to provide verbal responses to inquiries. Pt does benefit from extra time for processing and responding to commands. In conversation regarding daily activities, she recalled 1/2 items she had for lunch (~1.5 hrs prior to session) with  Min A question cues, however unable to recall previous OT or PT sessions/activities. Pt's son was present at bedside throughout session and noted another decline in her ability to maintain alertness and speech intelligibility since the weekend. He was receptive to all verbal and demonstrative educational opportunities that arose concerning pt's cognitive status, oral dysphagia (exacerbated by fatigue), and speech intelligibility. Pt left laying in bed with alarm set, needs within reach, son still present. Continue per current plan of care.        Pain Pain Assessment Pain Scale: Faces Faces Pain Scale: Hurts little more Pain Type: Acute pain Pain Location: Head Pain Descriptors / Indicators: Headache Pain Onset: On-going Pain Intervention(s): RN made aware Multiple Pain Sites: Yes 2nd Pain Site Pain Type: Acute pain Pain Location: Back  Therapy/Group: Individual Therapy  Arbutus Leas 06/20/2019, 2:31 PM

## 2019-06-20 NOTE — Progress Notes (Addendum)
[facility] PHYSICAL MEDICINE & REHABILITATION PROGRESS NOTE   Subjective/Complaints:  Pt states she has upper back pain and HA pain Participating with OT , come pass pointing noted  Reviewed labs low Mg+++ as well as K+   As discussed with pt , had cataracts correcting vision for reading in one eye and distance in the other eye  ROS- no CP, SOB, no abd pain  Objective:   No results found. Recent Labs    06/20/19 0508  WBC 12.5*  HGB 13.3  HCT 37.6  PLT 286   Recent Labs    06/19/19 0533 06/20/19 0508  NA 130* 131*  K 2.7* 3.6  CL 98 99  CO2 24 21*  GLUCOSE 104* 108*  BUN 5* <5*  CREATININE 0.47 0.46  CALCIUM 8.5* 8.9    Intake/Output Summary (Last 24 hours) at 06/20/2019 0830 Last data filed at 06/20/2019 0400 Gross per 24 hour  Intake 3257.5 ml  Output --  Net 3257.5 ml     Physical Exam: Vital Signs Blood pressure (!) 156/78, pulse 61, temperature 98.2 F (36.8 C), resp. rate 16, weight 55.8 kg, SpO2 100 %. General: No acute distress,  A little less lethargic, eyes open entire conversation; in bed  Mood and affect are appropriate Heart: Regular rate and rhythm no rubs murmurs or extra sounds Lungs: Clear to auscultation, breathing unlabored, no rales or wheezes Abdomen: Positive bowel sounds, soft nontender to palpation, nondistended Extremities: No clubbing, cyanosis, or edema Skin: No evidence of breakdown, no evidence of rash Neurologic: Cranial nerves II through XII intact, motor strength is 5/5 in bilateral deltoid, bicep, tricep, grip, hip flexor, knee extensors, ankle dorsiflexor and plantar flexor Sensory exam normal sensation to light touch and proprioception in bilateral upper and lower extremities Cerebellar exam normal finger to nose to finger as well as heel to shin in bilateral upper and lower extremities Musculoskeletal: Full range of motion in all 4 extremities. No joint swelling   Assessment/Plan: 1. Functional deficits secondary to  Cerebellar vermis ICH which require 3+ hours per day of interdisciplinary therapy in a comprehensive inpatient rehab setting.  Physiatrist is providing close team supervision and 24 hour management of active medical problems listed below.  Physiatrist and rehab team continue to assess barriers to discharge/monitor patient progress toward functional and medical goals  Care Tool:  Bathing    Body parts bathed by patient: Face   Body parts bathed by helper: Front perineal area, Buttocks Body parts n/a: Right upper leg, Left upper leg, Right lower leg, Left lower leg, Abdomen, Chest, Left arm, Right arm(did not attempt)   Bathing assist Assist Level: Maximal Assistance - Patient 24 - 49%     Upper Body Dressing/Undressing Upper body dressing   What is the patient wearing?: Pull over shirt    Upper body assist Assist Level: Total Assistance - Patient < 25%    Lower Body Dressing/Undressing Lower body dressing      What is the patient wearing?: Incontinence brief, Pants     Lower body assist Assist for lower body dressing: Dependent - Patient 0%     Toileting Toileting    Toileting assist Assist for toileting: Dependent - Patient 0%     Transfers Chair/bed transfer  Transfers assist  Chair/bed transfer activity did not occur: Safety/medical concerns  Chair/bed transfer assist level: Moderate Assistance - Patient 50 - 74%     Locomotion Ambulation   Ambulation assist   Ambulation activity did not occur: Safety/medical concerns  Walk 10 feet activity   Assist  Walk 10 feet activity did not occur: Safety/medical concerns        Walk 50 feet activity   Assist Walk 50 feet with 2 turns activity did not occur: Safety/medical concerns         Walk 150 feet activity   Assist Walk 150 feet activity did not occur: Safety/medical concerns         Walk 10 feet on uneven surface  activity   Assist Walk 10 feet on uneven surfaces  activity did not occur: Safety/medical concerns         Wheelchair     Assist Will patient use wheelchair at discharge?: No(Not anticipated; no LT goals set)   Wheelchair activity did not occur: Safety/medical concerns         Wheelchair 50 feet with 2 turns activity    Assist    Wheelchair 50 feet with 2 turns activity did not occur: Safety/medical concerns       Wheelchair 150 feet activity     Assist  Wheelchair 150 feet activity did not occur: Safety/medical concerns       Blood pressure (!) 156/78, pulse 61, temperature 98.2 F (36.8 C), resp. rate 16, weight 55.8 kg, SpO2 100 %.    1.  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  More alert today, pass pointing with OT. Cont PT< OT, SLP 15/7             -patient may  shower             -ELOS/Goals: ~ 2.5 to 3 weeks/ Goals CGA- may need to downgrade 2.  Antithrombotics: -DVT/anticoagulation: Subcutaneous heparin initiated 06/11/2019             -antiplatelet therapy: N/A 3. Pain Management: Continue Tramadol as needed, Fioricet as needed for headaches 4. Mood: Provide emotional support             -antipsychotic agents: N/A 5. Neuropsych: This patient is NOT capable of making decisions on her own behalf. 6. Skin/Wound Care: Continue routine skin checks 7. Fluids/Electrolytes/Nutrition: Routine in and outs with follow-up chemistries 8.  Hypertension.  Norvasc 10 mg daily, Toprol-XL 50 mg daily.  Monitor with increased mobility- monitor closely.    Vitals:   06/19/19 2155 06/20/19 0517  BP: (!) 171/94 (!) 156/78  Pulse: 68 61  Resp:  16  Temp:  98.2 F (36.8 C)  SpO2:  100%  some lability , check orhtostatics , cont IV poor po intake  9.  History of seizure disorder.  Continue Depakote 500 mg every 12 hours For old seizure d/o as well as prevention of new  seizures 10. Diplopia- suggest eye patch intermittently to help with double vision 11. Hypokalemia- last K+ 2.6- will supplement with K CL IV riders  will monitor, also with mild hyponatremia , not on diuretics   2/6- gave KCl 40 mEq x3 in 24 hours and also 10 mEq hourly by IVFs  E lyte abnormaility, ? Etiology , low K+, Mg+++ and Na+ ask nephro to consult, BUN/Creat ok, urine 2/3 without proteinuria-  2/7- K+ up to 2.7- will give another KCl 40 mEq x3 and also add Mg level in AM, and Mg Oxide 400 mg BID since likely low, since K+ won't improve; labs in AM Hyponatremia- likely related to IVF will change from .45 NS  to .9 NS - expect gradual improvement , recheck BMET in am   2/6- Na up to 131- doing better- con't NS IVFs  2/7- Na back down to 130- will con't IVFs and recheck in AM BMP Latest Ref Rng & Units 06/20/2019 06/19/2019 06/18/2019  Glucose 70 - 99 mg/dL 108(H) 104(H) 110(H)  BUN 8 - 23 mg/dL <5(L) 5(L) 8  Creatinine 0.44 - 1.00 mg/dL 0.46 0.47 0.53  Sodium 135 - 145 mmol/L 131(L) 130(L) 133(L)  Potassium 3.5 - 5.1 mmol/L 3.6 2.7(LL) 2.9(L)  Chloride 98 - 111 mmol/L 99 98 99  CO2 22 - 32 mmol/L 21(L) 24 25  Calcium 8.9 - 10.3 mg/dL 8.9 8.5(L) 8.5(L)    12. Severe daily headaches due to ICH-add Topamax 13. Nausea/+/- vomiting- suggest zofran since less sedating- although can cause constipation- try prn, or scheduled before breakfast to help with nausea.  2/6- still having daily HA's - con't meds for now.  2/7- saw hadn't added Topamax on 2/5, so added 2/6 in afternoon- HA slightly improved today.   14. Dysphagia due to CVA   LOS: 6 days A FACE TO FACE EVALUATION WAS PERFORMED  Charlett Blake 06/20/2019, 8:30 AM

## 2019-06-20 NOTE — Progress Notes (Addendum)
Physical Therapy Session Note  Patient Details  Name: Tanya Knight MRN: [id number] Date of Birth: 1940-08-12  Today's Date: 06/20/2019 PT Individual Time: 1010-1035 PT Individual Time Calculation (min): 25 min   and Today's Date: 06/20/2019 PT Missed Time: 35 Minutes Missed Time Reason: Patient fatigue(lethargic)  Short Term Goals: Week 1:  PT Short Term Goal 1 (Week 1): Pt will participate in static standing balance with modA PT Short Term Goal 2 (Week 1): Pt will demonstrate improved attention to task requiring stimulation <50% during session PT Short Term Goal 3 (Week 1): Pt will perform sit<>stand transfer with minA PT Short Term Goal 4 (Week 1): Pt will perform dynamic sitting balance with minA PT Short Term Goal 5 (Week 1): Pt will initiate gait training  Skilled Therapeutic Interventions/Progress Updates:    Patient seated in w/c poor positioning with hips scooted forward/almost falling out of chair and head in significant cervical extension demonstrating safety concern d/t severe lethargy, NTs in room about to re-position pt with steady but agreed to let therapy take over. Pt agreeable to therapy treatment and denies pain and dizziness with positional changes today. Therapist assist pt to forward trunk lean and +2 to scoot hips back in w/c for better sitting posture prior to transferring back to bed modA. Squat pivot transfer back to bed modA for positioning, technique, and cues for sequencing. Seated at EOB with minA for support, pt able to reach with UE and give therapist a high five crossing midline repeated bilaterally x1 each, with cues to open eyes and to visually spot target prior to reaching. Sit>supine modA for truncal control and UE positioning, pt able to manage LE with minA for positioning. Pt appeared incontinent of bladder, therapist assist to doff brief and pericare, modA rolling R<>L for appropriate weight shift, pt able to reach and bend legs with cues but very slow to  initiate movement. The following exercises were performed in supine/hooklying with cues for technique, hold times, and positioning: wiggling toes, ankle DF/PF x5 reps, bridging 2x5, hip adduction and abduction with light manual resistance from therapist x5 reps each. Attempted to have pt track therapist's finger to assess ocular ROM/tracking, but unable to d/t significant lethargy. Pt requires max stimulation to maintain attention to task d/t lethargy. Therapist switched call bell to soft touch for pt accessibility. Pt left supine in bed with needs in reach and bed alarm set.   Therapy Documentation Precautions:  Precautions Precautions: Fall Precaution Comments: dizziness, diplopia, elevated BP Restrictions Weight Bearing Restrictions: No    Therapy/Group: Individual Therapy  [name] SPT      This entire session was performed under the direct supervision and direction of a licensed physical therapist. I have personally read, edited and approve of the note as written.  Netta Corrigan, PT, DPT, CSRS 06/20/19    11:29 AM                           06/20/2019, 7:43 AM

## 2019-06-21 ENCOUNTER — Inpatient Hospital Stay (HOSPITAL_COMMUNITY): Payer: Medicare PPO | Admitting: Speech Pathology

## 2019-06-21 ENCOUNTER — Inpatient Hospital Stay (HOSPITAL_COMMUNITY): Payer: Medicare PPO | Admitting: Physical Therapy

## 2019-06-21 ENCOUNTER — Inpatient Hospital Stay (HOSPITAL_COMMUNITY): Payer: Medicare PPO | Admitting: Occupational Therapy

## 2019-06-21 LAB — NA AND K (SODIUM & POTASSIUM), RAND UR
Potassium Urine: 27 mmol/L
Sodium, Ur: 103 mmol/L

## 2019-06-21 LAB — URINALYSIS, ROUTINE W REFLEX MICROSCOPIC
Bilirubin Urine: NEGATIVE
Glucose, UA: NEGATIVE mg/dL
Ketones, ur: NEGATIVE mg/dL
Nitrite: NEGATIVE
Protein, ur: NEGATIVE mg/dL
Specific Gravity, Urine: 1.008 (ref 1.005–1.030)
pH: 9 — ABNORMAL HIGH (ref 5.0–8.0)

## 2019-06-21 LAB — OSMOLALITY, URINE: Osmolality, Ur: 369 mOsm/kg (ref 300–900)

## 2019-06-21 LAB — CREATININE, URINE, RANDOM: Creatinine, Urine: 30.58 mg/dL

## 2019-06-21 LAB — CHLORIDE, URINE, RANDOM: Chloride Urine: 77 mmol/L

## 2019-06-21 LAB — RENAL FUNCTION PANEL
Albumin: 3.2 g/dL — ABNORMAL LOW (ref 3.5–5.0)
Anion gap: 12 (ref 5–15)
BUN: 9 mg/dL (ref 8–23)
CO2: 20 mmol/L — ABNORMAL LOW (ref 22–32)
Calcium: 9 mg/dL (ref 8.9–10.3)
Chloride: 101 mmol/L (ref 98–111)
Creatinine, Ser: 0.55 mg/dL (ref 0.44–1.00)
GFR calc Af Amer: >60 mL/min (ref >60)
GFR calc non Af Amer: >60 mL/min (ref >60)
Glucose, Bld: 122 mg/dL — ABNORMAL HIGH (ref 70–99)
Phosphorus: 2 mg/dL — ABNORMAL LOW (ref 2.5–4.6)
Potassium: 3.1 mmol/L — ABNORMAL LOW (ref 3.5–5.1)
Sodium: 133 mmol/L — ABNORMAL LOW (ref 135–145)

## 2019-06-21 MED ORDER — POTASSIUM CHLORIDE CRYS ER 20 MEQ PO TBCR
20.0000 meq | EXTENDED_RELEASE_TABLET | Freq: Two times a day (BID) | ORAL | Status: AC
  Administered 2019-06-21 – 2019-06-22 (×3): 20 meq via ORAL
  Filled 2019-06-21 (×3): qty 1

## 2019-06-21 NOTE — Plan of Care (Signed)
  Problem: RH BOWEL ELIMINATION Goal: RH STG MANAGE BOWEL W/MEDICATION W/ASSISTANCE Description: STG Manage Bowel with Medication with min Assistance. Outcome: Progressing   Problem: RH SKIN INTEGRITY Goal: RH STG SKIN FREE OF INFECTION/BREAKDOWN Description: Patients skin will remain free from further infection or breakdown with min assist. Outcome: Progressing Goal: RH STG MAINTAIN SKIN INTEGRITY WITH ASSISTANCE Description: STG Maintain Skin Integrity With min Assistance. Outcome: Progressing Goal: RH STG ABLE TO PERFORM INCISION/WOUND CARE W/ASSISTANCE Description: STG Able To Perform Incision/Wound Care With min/mod Assistance. Outcome: Progressing   Problem: RH SAFETY Goal: RH STG ADHERE TO SAFETY PRECAUTIONS W/ASSISTANCE/DEVICE Description: STG Adhere to Safety Precautions With min Assistance/Device. Outcome: Progressing   Problem: RH KNOWLEDGE DEFICIT Goal: RH STG INCREASE KNOWLEDGE OF HYPERTENSION Description: Patient/caregiver will verbalize understanding of management of HTN including diet, exercise, medications, monitoring, and follow up care with min assist. Outcome: Progressing Goal: RH STG INCREASE KNOWLEGDE OF HYPERLIPIDEMIA Description: Patient/caregiver will verbalize understanding of management of HLD including diet, exercise, medications, monitoring, and follow up care with min assist. Outcome: Progressing Goal: RH STG INCREASE KNOWLEDGE OF STROKE PROPHYLAXIS Description: Patient/caregiver will verbalize understanding of management of stroke prophylaxis including diet, exercise, medications, monitoring, and follow up care with min assist. Outcome: Progressing   Problem: RH BOWEL ELIMINATION Goal: RH STG MANAGE BOWEL WITH ASSISTANCE Description: STG Manage Bowel with min Assistance. Outcome: Not Progressing   Problem: RH BLADDER ELIMINATION Goal: RH STG MANAGE BLADDER WITH ASSISTANCE Description: STG Manage Bladder With min Assistance Outcome: Not  Progressing

## 2019-06-21 NOTE — Progress Notes (Addendum)
Speech Language Pathology Daily Session Note  Patient Details  Name: Tanya Knight MRN: [id number] Date of Birth: 02-28-1941  Today's Date: 06/21/2019 SLP Individual Time: 0730-0800 SLP Individual Time Calculation (min): 30 min  Short Term Goals: Week 1: SLP Short Term Goal 1 (Week 1): (not able to target at this time due to severe deficits and lethargy) SLP Short Term Goal 1 - Progress (Week 1): Discontinued (comment) SLP Short Term Goal 2 (Week 1): Pt will recall new and daily information with Min A verbal/visual cues for use of compensatory strategies. SLP Short Term Goal 3 (Week 1): Pt will provide intelligible word and phrase level verbal responses to questions in 50% opportunities with Mod A cues. SLP Short Term Goal 3 - Progress (Week 1): Revised due to lack of progress SLP Short Term Goal 4 (Week 1): Pt will consume current diet with minimal overt s/sx aspiration and demonstrate efficient mastication and oral clearance with Mod A verbal/visual cues SLP Short Term Goal 5 (Week 1): Pt will initiate during functional familiar tasks in 75% opportunities with Max A multimodal cues.  Skilled Therapeutic Interventions: Pt was seen for skilled ST targeting dysphagia and speech goals. Pt found awake and upright in bed consuming breakfast with NT's full supervision. SLP took over supervision and although pt observed to use safe swallow strategies independently, she became fatigued after consuming ~10% of Dys 3 potatoes and therefore SLP took over feeding pt in order to preserve endurance and in hopes to increase overall Po intake. Pt consumed entire bowl of grits. No overt s/sx aspiration observed during intake of potatoes or grits, however prolonged mastication and trace lingual residue of solids evident. During consumption of thin liquids via cup, pt exhibited immediate cough in ~40% presentations, suspicious for delayed swallow initiation (upon observation of holding and hyoid movement). SLP  provided verbal cues for pt to only consume POs when fully alert, upright, and when drinking to swallow bolus quickly. With these verbal cues, no further overt s/sx aspiration observed with liquids during session. Recommend continue current diet. During word and phrase level speech tasks, pt was 90% intelligible and responsive to Mod A verbal cues to increase rate of speech and vocal intensity. Suspect that pt will be more independent with use of speech strategies once able to maintain alertness, as her intelligibility appears to fluctuate significantly depending on degree of lethargy. Pt left laying semi-reclined, needs within reach, alarm set. Continue per current plan of care.    Pain Pain Assessment Pain Scale: Faces Faces Pain Scale: No hurt  Therapy/Group: Individual Therapy  Arbutus Leas 06/21/2019, 9:58 AM

## 2019-06-21 NOTE — Progress Notes (Signed)
Physical Therapy Session Note  Patient Details  Name: Tanya Knight MRN: [id number] Date of Birth: 1940/05/27  Today's Date: 06/21/2019 PT Individual Time: [id number] PT Individual Time Calculation (min): 54 min   Short Term Goals: Week 1:  PT Short Term Goal 1 (Week 1): Pt will participate in static standing balance with modA PT Short Term Goal 2 (Week 1): Pt will demonstrate improved attention to task requiring stimulation <50% during session PT Short Term Goal 3 (Week 1): Pt will perform sit<>stand transfer with minA PT Short Term Goal 4 (Week 1): Pt will perform dynamic sitting balance with minA PT Short Term Goal 5 (Week 1): Pt will initiate gait training  Skilled Therapeutic Interventions/Progress Updates:    Pt received supine in bed with her son, Pilar Plate, present and Neoma Laming, SW present to discuss ELOS and goals as well as pt's progress this past week. Pt very lethargic and holding R hand on her face/head reporting 10/10 R sided headache pain - RN notified and present for medication administration. Despite this, pt agreeable to therapy session. Supine>sit, HOB partially elevated, with mod assist primarily for trunk upright and max multimodal cuing for sequencing with increased time for processing and initiation of the task. Tolerated sitting EOB ~41minutes with therapist providing mod/max assist for trunk control due to posterior lean but with manual facilitation for forward trunk weight shift and hand-over-hand assist to place hands forward on her knees pt able to maintain this position ~30seconds with min assist - cuing throughout to maintain eyes awake as pt remains lethargic. L squat pivot transfer EOB>w/c with mod/max assist for lifting/pivoting hips and +2 present for safety - manual facilitation for increased anterior trunk flexion and pt able to initiate lifting bottom.  Transported to/from gym in w/c. Therapist retrieved pt a TIS w/c to allow improved upright OOB activity tolerance  between therapy sessions as she is unsafe to sit in regular w/c. R squat pivot w/c>TIS w/c with max assist for lifting/pivoting hips due to increased pt lethargy - +2 present for safety - manual facilitation for increased anterior trunk flexion and weight shift. Transported back to room in TIS w/c with pt starting to fall asleep therefore therapist assisted with R squat pivot back to bed again with max assist for lifting/pivoting hips. Sit>supine with max assist for trunk descent and B LE management with multimodal cuing for sequencing to increase pt participation and independence. Pt left supine in bed with needs in reach, lines intact, bed alarm on, and pt's [name]) present.   Therapy Documentation Precautions:  Precautions Precautions: Fall Precaution Comments: diplopia Restrictions Weight Bearing Restrictions: No  Pain: Reports 10/10 R sided headache pain - RN present to provide medication.    Therapy/Group: Individual Therapy  Tawana Scale, PT, DPT 06/21/2019, 12:58 PM

## 2019-06-21 NOTE — Plan of Care (Deleted)
  Problem: Consults Goal: RH STROKE PATIENT EDUCATION Description: See Patient Education module for education specifics  Outcome: Progressing   Problem: RH BOWEL ELIMINATION Goal: RH STG MANAGE BOWEL W/MEDICATION W/ASSISTANCE Description: STG Manage Bowel with Medication with min Assistance. 06/21/2019 1822 by Toy Cookey F, LPN Outcome: Progressing 06/21/2019 1219 by Amanda Cockayne, LPN Outcome: Progressing   Problem: RH SKIN INTEGRITY Goal: RH STG SKIN FREE OF INFECTION/BREAKDOWN Description: Patients skin will remain free from further infection or breakdown with min assist. 06/21/2019 1822 by Amanda Cockayne, LPN Outcome: Progressing 06/21/2019 1219 by Amanda Cockayne, LPN Outcome: Progressing Goal: RH STG MAINTAIN SKIN INTEGRITY WITH ASSISTANCE Description: STG Maintain Skin Integrity With min Assistance. 06/21/2019 1822 by Toy Cookey F, LPN Outcome: Progressing 06/21/2019 1219 by Amanda Cockayne, LPN Outcome: Progressing   Problem: RH SAFETY Goal: RH STG ADHERE TO SAFETY PRECAUTIONS W/ASSISTANCE/DEVICE Description: STG Adhere to Safety Precautions With min Assistance/Device. 06/21/2019 1822 by Toy Cookey F, LPN Outcome: Progressing 06/21/2019 1219 by Amanda Cockayne, LPN Outcome: Progressing   Problem: RH KNOWLEDGE DEFICIT Goal: RH STG INCREASE KNOWLEDGE OF HYPERTENSION Description: Patient/caregiver will verbalize understanding of management of HTN including diet, exercise, medications, monitoring, and follow up care with min assist. 06/21/2019 1822 by Toy Cookey F, LPN Outcome: Progressing 06/21/2019 1219 by Amanda Cockayne, LPN Outcome: Progressing Goal: RH STG INCREASE KNOWLEGDE OF HYPERLIPIDEMIA Description: Patient/caregiver will verbalize understanding of management of HLD including diet, exercise, medications, monitoring, and follow up care with min assist. 06/21/2019 1822 by Toy Cookey F, LPN Outcome: Progressing 06/21/2019 1219 by Amanda Cockayne, LPN Outcome: Progressing Goal: RH STG INCREASE KNOWLEDGE OF STROKE PROPHYLAXIS Description: Patient/caregiver will verbalize understanding of management of stroke prophylaxis including diet, exercise, medications, monitoring, and follow up care with min assist. 06/21/2019 1822 by Amanda Cockayne, LPN Outcome: Progressing 06/21/2019 1219 by Amanda Cockayne, LPN Outcome: Progressing   Problem: RH BOWEL ELIMINATION Goal: RH STG MANAGE BOWEL WITH ASSISTANCE Description: STG Manage Bowel with min Assistance. 06/21/2019 1822 by Toy Cookey F, LPN Outcome: Not Progressing 06/21/2019 1219 by Amanda Cockayne, LPN Outcome: Not Progressing   Problem: RH BLADDER ELIMINATION Goal: RH STG MANAGE BLADDER WITH ASSISTANCE Description: STG Manage Bladder With min Assistance 06/21/2019 1822 by Amanda Cockayne, LPN Outcome: Not Progressing 06/21/2019 1219 by Amanda Cockayne, LPN Outcome: Not Progressing   Problem: RH SKIN INTEGRITY Goal: RH STG ABLE TO PERFORM INCISION/WOUND CARE W/ASSISTANCE Description: STG Able To Perform Incision/Wound Care With min/mod Assistance. 06/21/2019 1822 by Amanda Cockayne, LPN Outcome: Not Progressing 06/21/2019 1219 by Amanda Cockayne, LPN Outcome: Progressing

## 2019-06-21 NOTE — Progress Notes (Signed)
[facility] PROGRESS NOTE   Subjective/Complaints:  My bottom hurts ,  Discussed barrier cream use with LPN, pt incont of urine Discussed with Nephro, likely SIADH  ROS- no CP, SOB, no abd pain  Objective:   No results found. Recent Labs    06/20/19 0508  WBC 12.5*  HGB 13.3  HCT 37.6  PLT 286   Recent Labs    06/19/19 0533 06/20/19 0508  NA 130* 131*  K 2.7* 3.6  CL 98 99  CO2 24 21*  GLUCOSE 104* 108*  BUN 5* <5*  CREATININE 0.47 0.46  CALCIUM 8.5* 8.9    Intake/Output Summary (Last 24 hours) at 06/21/2019 0854 Last data filed at 06/21/2019 0847 Gross per 24 hour  Intake 800 ml  Output --  Net 800 ml     Physical Exam: Vital Signs Blood pressure (!) 143/71, pulse 73, temperature 98.7 F (37.1 C), resp. rate 16, weight 55.8 kg, SpO2 99 %. General: No acute distress,  A little less lethargic, eyes open entire conversation; in bed  Mood and affect are appropriate Heart: Regular rate and rhythm no rubs murmurs or extra sounds Lungs: Clear to auscultation, breathing unlabored, no rales or wheezes Abdomen: Positive bowel sounds, soft nontender to palpation, nondistended Extremities: No clubbing, cyanosis, or edema Skin: No evidence of breakdown, no evidence of rash Neurologic: oriented to person place time (month not day) and situation  motor strength is 5/5 in bilateral deltoid, bicep, tricep, grip, hip flexor, knee extensors, ankle dorsiflexor and plantar flexor Sensory exam normal sensation to light touch and proprioception in bilateral upper and lower extremities Cerebellar exam normal finger to nose to finger as well as heel to shin in bilateral upper and lower extremities Musculoskeletal: Full range of motion in all 4 extremities. No joint swelling   Assessment/Plan: 1. Functional deficits secondary to Cerebellar vermis ICH which require 3+ hours per day of interdisciplinary therapy in a comprehensive inpatient rehab  setting.  Physiatrist is providing close team supervision and 24 hour management of active medical problems listed below.  Physiatrist and rehab team continue to assess barriers to discharge/monitor patient progress toward functional and medical goals  Care Tool:  Bathing    Body parts bathed by patient: Face   Body parts bathed by helper: Front perineal area, Buttocks Body parts n/a: Right upper leg, Left upper leg, Right lower leg, Left lower leg, Abdomen, Chest, Left arm, Right arm(did not attempt)   Bathing assist Assist Level: Maximal Assistance - Patient 24 - 49%     Upper Body Dressing/Undressing Upper body dressing   What is the patient wearing?: Pull over shirt    Upper body assist Assist Level: Total Assistance - Patient < 25%    Lower Body Dressing/Undressing Lower body dressing      What is the patient wearing?: Incontinence brief, Pants     Lower body assist Assist for lower body dressing: Dependent - Patient 0%     Toileting Toileting    Toileting assist Assist for toileting: Dependent - Patient 0%     Transfers Chair/bed transfer  Transfers assist  Chair/bed transfer activity did not occur: Safety/medical concerns  Chair/bed transfer assist level: Moderate Assistance - Patient 50 - 74%(stand pivot)     Locomotion Ambulation   Ambulation assist   Ambulation activity did not occur: Safety/medical concerns          Walk 10 feet activity   Assist  Walk 10 feet activity did not  occur: Safety/medical concerns        Walk 50 feet activity   Assist Walk 50 feet with 2 turns activity did not occur: Safety/medical concerns         Walk 150 feet activity   Assist Walk 150 feet activity did not occur: Safety/medical concerns         Walk 10 feet on uneven surface  activity   Assist Walk 10 feet on uneven surfaces activity did not occur: Safety/medical concerns         Wheelchair     Assist Will patient use  wheelchair at discharge?: No(Not anticipated; no LT goals set)   Wheelchair activity did not occur: Safety/medical concerns         Wheelchair 50 feet with 2 turns activity    Assist    Wheelchair 50 feet with 2 turns activity did not occur: Safety/medical concerns       Wheelchair 150 feet activity     Assist  Wheelchair 150 feet activity did not occur: Safety/medical concerns       Blood pressure (!) 143/71, pulse 73, temperature 98.7 F (37.1 C), resp. rate 16, weight 55.8 kg, SpO2 99 %.    1.  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  More alert today, oriented to person, place , month and "cerebral hemorrhage"  Cont PT< OT, SLP 15/7             -patient may  shower             -ELOS/Goals: ~ 2.5 to 3 weeks/ Goals CGA- may need to downgrade 2.  Antithrombotics: -DVT/anticoagulation: Subcutaneous heparin initiated 06/11/2019             -antiplatelet therapy: N/A 3. Pain Management: Continue Tramadol as needed, Fioricet as needed for headaches 4. Mood: Provide emotional support             -antipsychotic agents: N/A 5. Neuropsych: This patient is NOT capable of making decisions on her own behalf. 6. Skin/Wound Care: Continue routine skin checks 7. Fluids/Electrolytes/Nutrition: Routine in and outs with follow-up chemistries 8.  Hypertension.  Norvasc 10 mg daily, Toprol-XL 50 mg daily.  Monitor with increased mobility- monitor closely.    Vitals:   06/20/19 1443 06/20/19 1947  BP:  (!) 143/71  Pulse:  73  Resp: 18 16  Temp:  98.7 F (37.1 C)  SpO2:  99%  some lability , check orhtostatics , cont IV poor po intake  9.  History of seizure disorder.  Continue Depakote 500 mg every 12 hours For old seizure d/o as well as prevention of new seizures 10. Diplopia- suggest eye patch intermittently to help with double  vision 11. Hypokalemia- last K+ 2.6- will supplement with K CL IV riders  will monitor, also with mild hyponatremia , not on diuretics   2/6- gave KCl 40 mEq x3 in 24 hours and also 10 mEq hourly by IVFs  E lyte abnormaility, ? Etiology , low K+, Mg+++ and Na+ ask nephro to consult, BUN/Creat ok, urine 2/3 without proteinuria-  2/7- K+ up to 2.7- will give another KCl 40 mEq x3 and also add Mg level in AM, and Mg Oxide 400 mg BID since likely low, since K+ won't improve; labs in AM Hyponatremia- As per nephro, now receiving lactated ringers   2/6- Na up to 131- doing better- con't NS  IVFs  2/7- Na back down to 130- will con't IVFs and recheck in AM BMP Latest Ref Rng & Units 06/20/2019 06/19/2019 06/18/2019  Glucose 70 - 99 mg/dL 108(H) 104(H) 110(H)  BUN 8 - 23 mg/dL <5(L) 5(L) 8  Creatinine 0.44 - 1.00 mg/dL 0.46 0.47 0.53  Sodium 135 - 145 mmol/L 131(L) 130(L) 133(L)  Potassium 3.5 - 5.1 mmol/L 3.6 2.7(LL) 2.9(L)  Chloride 98 - 111 mmol/L 99 98 99  CO2 22 - 32 mmol/L 21(L) 24 25  Calcium 8.9 - 10.3 mg/dL 8.9 8.5(L) 8.5(L)    12. Severe daily headaches due to ICH-Improved ,  now on BID Topamax 13. Nausea/+/- vomiting- improved ate 100% breakfast!  14. Dysphagia due to CVA   LOS: 7 days A FACE TO FACE EVALUATION WAS PERFORMED  Charlett Blake 06/21/2019, 8:54 AM

## 2019-06-21 NOTE — Progress Notes (Signed)
Patient ID: Tanya Knight, female   DOB: 11-Jun-1940, 79 y.o.   MRN: [id number] [facility] Progress Note   Assessment/ Plan:   1.  Hyponatremia: This appears to be euvolemic to possibly slightly hypovolemic.    Clinically consistent with SIADH versus nonosmotic ADH release.  TSH level normal with cortisol on the lower end of the spectrum on Solu-Medrol.  Sodium level slightly better with ongoing fluid/caloric supplementation.  Urine osmolality/electrolytes pending. 2.  Hypokalemia: Suspected to be total body deficit likely in the setting of poor oral intake and increased sodium delivery to distal tubal from ongoing normal saline.    Will supplement via oral route. 3.  Hypomagnesemia: Likely total body deficit compounded by ongoing PPI use (chronic).    On oral magnesium supplement. 4.  Hypertension:  Pressures fairly controlled on amlodipine and metoprolol, will continue to follow to decide on need to start ARB/ACE inhibitor. 5.  Status post hemorrhagic stroke: Continue efforts at blood pressure control with addition of ARB.  Currently in CIR.  Subjective:   Reports to have had a "yucky morning" with continued back pain and difficulty finding a comfortable position in bed.   Objective:   BP (!) 143/71 (BP Location: Left Arm)   Pulse 73   Temp 98.7 F (37.1 C)   Resp 16   Wt 55.8 kg   SpO2 99%   BMI 23.24 kg/m   Intake/Output Summary (Last 24 hours) at 06/21/2019 1226 Last data filed at 06/21/2019 0847 Gross per 24 hour  Intake 800 ml  Output -  Net 800 ml   Weight change:   Physical Exam: Gen: Appears uncomfortable resting in bed CVS: Pulse regular rhythm, normal rate, S1 and S2 normal Resp: Poor inspiratory effort with decreased breath sounds over bases, no rales/rhonchi Abd: Soft, flat, nontender Ext: Trace ankle edema  Imaging: No results found.  Labs: BMET Recent Labs  Lab 06/15/19 [id number] 06/17/19 0810 06/18/19 1018 06/18/19 2036 06/19/19 0533  06/20/19 0508 06/21/19 0857  NA 130* 125* 131* 133* 130* 131* 133*  K 3.5 2.6* 2.4* 2.9* 2.7* 3.6 3.1*  CL 92* 88* 98 99 98 99 101  CO2 25 27 21* 25 24 21* 20*  GLUCOSE 123* 116* 135* 110* 104* 108* 122*  BUN 14 10 10 8  5* <5* 9  CREATININE 0.47 0.43* 0.51 0.53 0.47 0.46 0.55  CALCIUM 9.3 9.1 8.6* 8.5* 8.5* 8.9 9.0  PHOS  --   --   --   --   --   --  2.0*   CBC Recent Labs  Lab 06/15/19 0712 06/20/19 0508  WBC 10.9* 12.5*  NEUTROABS 8.6* 9.4*  HGB 14.1 13.3  HCT 41.1 37.6  MCV 87.6 85.3  PLT 356 286   Medications:    . amLODipine  10 mg Oral Daily  . divalproex  500 mg Oral Q12H  . feeding supplement (PRO-STAT SUGAR FREE 64)  30 mL Oral BID  . heparin  5,000 Units Subcutaneous BID  . magnesium oxide  400 mg Oral TID  . [START ON 06/22/2019] methylPREDNISolone (SOLU-MEDROL) injection  10 mg Intravenous Daily  . metoprolol succinate  50 mg Oral Daily  . pantoprazole  40 mg Oral Daily  . potassium chloride  20 mEq Oral BID  . senna-docusate  1 tablet Oral BID  . topiramate  25 mg Oral BID   Elmarie Shiley, MD 06/21/2019, 12:26 PM

## 2019-06-21 NOTE — Progress Notes (Signed)
Occupational Therapy Session Note  Patient Details  Name: Tanya Knight MRN: [id number] Date of Birth: 01/16/1941  Today's Date: 06/21/2019 OT Individual Time: [id number] OT Individual Time Calculation (min): 56 min    Short Term Goals: Week 1:  OT Short Term Goal 1 (Week 1): Pt will maintain sustained attention for 30 mins with no more than min instructional cueing during ADL tasks. OT Short Term Goal 2 (Week 1): Pt will complete UB selfcare in sitting with supervision. OT Short Term Goal 3 (Week 1): Pt will complete LB dressing sit to stand with mod assist. OT Short Term Goal 4 (Week 1): Pt will complete toilet transfers stand pivot with the RW with min assist.  Skilled Therapeutic Interventions/Progress Updates:    Pt completed bathing and dressing sitting on the side of the bed.  She needed mod assist for transfer to the edge with mod assist for static sitting balance secondary to posterior lean.  Mod assist was maintained while she completed UB bathing and dressing to donn a pullover shirt.  Noted ataxia in the LUE as well as the right, but more significant in the left.  Pt with decreased accuracy when reaching for the soap or deodorant with the RUE secondary to diplopia.  She would at times close her right eye to help her focus more on the object.  When asked to identify therapist's fingers, she was inconsistent in both the right and left fields.  She needed mod assist for sit to stand with washing her LB.  Max assist for LB dressing.  Increased posterior lean in standing with decreased hip extension and upright posture through her head and trunk.  She needed min assist for crossing her LEs over the opposite knee for donning her gripper socks.  Returned to supine at conclusion of session secondary to increased fatigue with mod assist.  Call button and phone in reach with safety alarm in place.    Therapy Documentation Precautions:  Precautions Precautions: Fall Precaution Comments:  diplopia Restrictions Weight Bearing Restrictions: No  Pain: Pain Assessment Pain Scale: Faces Pain Score: 0-No pain Faces Pain Scale: No hurt Pain Type: Acute pain Pain Location: Head Pain Orientation: Right;Anterior Pain Descriptors / Indicators: Headache Pain Onset: On-going Pain Intervention(s): Repositioned;Emotional support ADL: See Care Tool Section for some details of mobility and selfcare  Therapy/Group: Individual Therapy  Tejay Hubert OTR/L 06/21/2019, 12:31 PM

## 2019-06-22 ENCOUNTER — Inpatient Hospital Stay (HOSPITAL_COMMUNITY): Payer: Medicare PPO | Admitting: Occupational Therapy

## 2019-06-22 ENCOUNTER — Inpatient Hospital Stay (HOSPITAL_COMMUNITY): Payer: Medicare PPO

## 2019-06-22 ENCOUNTER — Inpatient Hospital Stay (HOSPITAL_COMMUNITY): Payer: Medicare PPO | Admitting: Speech Pathology

## 2019-06-22 LAB — RENAL FUNCTION PANEL
Albumin: 3 g/dL — ABNORMAL LOW (ref 3.5–5.0)
Anion gap: 11 (ref 5–15)
BUN: 13 mg/dL (ref 8–23)
CO2: 18 mmol/L — ABNORMAL LOW (ref 22–32)
Calcium: 8.9 mg/dL (ref 8.9–10.3)
Chloride: 104 mmol/L (ref 98–111)
Creatinine, Ser: 0.44 mg/dL (ref 0.44–1.00)
GFR calc Af Amer: >60 mL/min (ref >60)
GFR calc non Af Amer: >60 mL/min (ref >60)
Glucose, Bld: 103 mg/dL — ABNORMAL HIGH (ref 70–99)
Phosphorus: 2.5 mg/dL (ref 2.5–4.6)
Potassium: 3.8 mmol/L (ref 3.5–5.1)
Sodium: 133 mmol/L — ABNORMAL LOW (ref 135–145)

## 2019-06-22 LAB — MAGNESIUM: Magnesium: 1.7 mg/dL (ref 1.7–2.4)

## 2019-06-22 MED ORDER — FAMOTIDINE 20 MG PO TABS
20.0000 mg | ORAL_TABLET | Freq: Every day | ORAL | Status: DC
  Administered 2019-06-22 – 2019-07-12 (×21): 20 mg via ORAL
  Filled 2019-06-22 (×21): qty 1

## 2019-06-22 MED ORDER — POTASSIUM CHLORIDE CRYS ER 20 MEQ PO TBCR
20.0000 meq | EXTENDED_RELEASE_TABLET | Freq: Two times a day (BID) | ORAL | Status: AC
  Administered 2019-06-22 – 2019-06-23 (×2): 20 meq via ORAL
  Filled 2019-06-22 (×2): qty 1

## 2019-06-22 NOTE — Progress Notes (Signed)
[facility] PHYSICAL MEDICINE & REHABILITATION PROGRESS NOTE   Subjective/Complaints:  Appreciate nephro note ROS- no CP, SOB, no abd pain Denies heartburn   Objective:   No results found. Recent Labs    06/20/19 0508  WBC 12.5*  HGB 13.3  HCT 37.6  PLT 286   Recent Labs    06/21/19 0857 06/22/19 0524  NA 133* 133*  K 3.1* 3.8  CL 101 104  CO2 20* 18*  GLUCOSE 122* 103*  BUN 9 13  CREATININE 0.55 0.44  CALCIUM 9.0 8.9    Intake/Output Summary (Last 24 hours) at 06/22/2019 0926 Last data filed at 06/22/2019 0817 Gross per 24 hour  Intake 1620 ml  Output 300 ml  Net 1320 ml     Physical Exam: Vital Signs Blood pressure (!) 165/96, pulse 74, temperature 97.8 F (36.6 C), temperature source Oral, resp. rate 16, weight 55.8 kg, SpO2 99 %. General: No acute distress,  A little less lethargic, eyes open entire conversation; in bed  Mood and affect are appropriate Heart: Regular rate and rhythm no rubs murmurs or extra sounds Lungs: Clear to auscultation, breathing unlabored, no rales or wheezes Abdomen: Positive bowel sounds, soft nontender to palpation, nondistended Extremities: No clubbing, cyanosis, or edema Skin: No evidence of breakdown, no evidence of rash Neurologic: oriented to person place time (month not day) and situation  motor strength is 5/5 in bilateral deltoid, bicep, tricep, grip, hip flexor, knee extensors, ankle dorsiflexor and plantar flexor Sensory exam normal sensation to light touch and proprioception in bilateral upper and lower extremities Cerebellar exam normal finger to nose to finger as well as heel to shin in bilateral upper and lower extremities Musculoskeletal: Full range of motion in all 4 extremities. No joint swelling   Assessment/Plan: 1. Functional deficits secondary to Cerebellar vermis ICH which require 3+ hours per day of interdisciplinary therapy in a comprehensive inpatient rehab setting.  Physiatrist is providing close team  supervision and 24 hour management of active medical problems listed below.  Physiatrist and rehab team continue to assess barriers to discharge/monitor patient progress toward functional and medical goals  Care Tool:  Bathing    Body parts bathed by patient: Right arm, Left arm, Chest, Abdomen, Right upper leg, Left upper leg, Right lower leg, Left lower leg, Face   Body parts bathed by helper: Front perineal area, Buttocks Body parts n/a: Right upper leg, Left upper leg, Right lower leg, Left lower leg, Abdomen, Chest, Left arm, Right arm(did not attempt)   Bathing assist Assist Level: Moderate Assistance - Patient 50 - 74%     Upper Body Dressing/Undressing Upper body dressing   What is the patient wearing?: Pull over shirt    Upper body assist Assist Level: Moderate Assistance - Patient 50 - 74%    Lower Body Dressing/Undressing Lower body dressing      What is the patient wearing?: Incontinence brief, Pants     Lower body assist Assist for lower body dressing: Maximal Assistance - Patient 25 - 49%     Toileting Toileting    Toileting assist Assist for toileting: Dependent - Patient 0%     Transfers Chair/bed transfer  Transfers assist  Chair/bed transfer activity did not occur: Safety/medical concerns  Chair/bed transfer assist level: Maximal Assistance - Patient 25 - 49%     Locomotion Ambulation   Ambulation assist   Ambulation activity did not occur: Safety/medical concerns          Walk 10 feet activity  Assist  Walk 10 feet activity did not occur: Safety/medical concerns        Walk 50 feet activity   Assist Walk 50 feet with 2 turns activity did not occur: Safety/medical concerns         Walk 150 feet activity   Assist Walk 150 feet activity did not occur: Safety/medical concerns         Walk 10 feet on uneven surface  activity   Assist Walk 10 feet on uneven surfaces activity did not occur: Safety/medical  concerns         Wheelchair     Assist Will patient use wheelchair at discharge?: No(Not anticipated; no LT goals set)   Wheelchair activity did not occur: Safety/medical concerns         Wheelchair 50 feet with 2 turns activity    Assist    Wheelchair 50 feet with 2 turns activity did not occur: Safety/medical concerns       Wheelchair 150 feet activity     Assist  Wheelchair 150 feet activity did not occur: Safety/medical concerns       Blood pressure (!) 165/96, pulse 74, temperature 97.8 F (36.6 C), temperature source Oral, resp. rate 16, weight 55.8 kg, SpO2 99 %.    1.  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  More alert today, oriented to person, place , month and "cerebral hemorrhage"  Cont PT< OT, SLP 15/7             -patient may  shower         Team conference today please see physician documentation under team conference tab, met with team  to discuss problems,progress, and goals. Formulized individual treatment plan based on medical history, underlying problem and comorbidities. 2.  Antithrombotics: -DVT/anticoagulation: Subcutaneous heparin initiated 06/11/2019             -antiplatelet therapy: N/A 3. Pain Management: Continue Tramadol as needed, Fioricet as needed for headaches 4. Mood: Provide emotional support             -antipsychotic agents: N/A 5. Neuropsych: This patient is NOT capable of making decisions on her own behalf. 6. Skin/Wound Care: Continue routine skin checks 7. Fluids/Electrolytes/Nutrition: Routine in and outs with follow-up chemistries 8.  Hypertension.  Norvasc 10 mg daily, Toprol-XL 50 mg daily.  Monitor with increased mobility- monitor closely.    Vitals:   06/21/19 1947 06/22/19 0406  BP: (!) 141/85 (!) 165/96  Pulse: 83 74  Resp: 16 16  Temp: (!) 97.5 F (36.4 C)  97.8 F (36.6 C)  SpO2: 99% 99%  some lability , check orhtostatics , cont IV poor po intake  9.  History of seizure disorder.  Continue Depakote 500 mg every 12 hours For old seizure d/o as well as prevention of new seizures 10. Diplopia- suggest eye patch intermittently to help with double vision 11. Hypokalemia- last K+ 2.6- will supplement with K CL IV riders  will monitor, also with mild hyponatremia , not on diuretics   2/6- gave KCl 40 mEq x3 in 24 hours and also 10 mEq hourly by IVFs  E lyte abnormaility, ? Etiology , low K+, Mg+++ and Na+ ask nephro to consult, BUN/Creat ok, urine 2/3 without proteinuria-  2/7- K+ up to 2.7- will give another KCl 40 mEq x3 and also add Mg level in AM, and Mg Oxide  400 mg BID since likely low, since K+ won't improve; labs in AM Hyponatremia- As per nephro, now receiving lactated ringers   2/6- Na up to 131- doing better- con't NS IVFs  2/7- Na back down to 130- will con't IVFs and recheck in AM BMP Latest Ref Rng & Units 06/22/2019 06/21/2019 06/20/2019  Glucose 70 - 99 mg/dL 103(H) 122(H) 108(H)  BUN 8 - 23 mg/dL 13 9 <5(L)  Creatinine 0.44 - 1.00 mg/dL 0.44 0.55 0.46  Sodium 135 - 145 mmol/L 133(L) 133(L) 131(L)  Potassium 3.5 - 5.1 mmol/L 3.8 3.1(L) 3.6  Chloride 98 - 111 mmol/L 104 101 99  CO2 22 - 32 mmol/L 18(L) 20(L) 21(L)  Calcium 8.9 - 10.3 mg/dL 8.9 9.0 8.9    12. Severe daily headaches due to ICH-Improved ,  now on BID Topamax 13. Nausea/+/- vomiting- improved intake better 14 GERD will change PPI to H2 blocker to help with Mg+++ absorption  14. Dysphagia due to CVA   LOS: 8 days A FACE TO FACE EVALUATION WAS PERFORMED  Charlett Blake 06/22/2019, 9:26 AM

## 2019-06-22 NOTE — Progress Notes (Signed)
Occupational Therapy Session Note  Patient Details  Name: Tanya Knight MRN: [id number] Date of Birth: 06/01/1940  Today's Date: 06/22/2019 OT Individual Time: 1305-1405 OT Individual Time Calculation (min): 60 min    Short Term Goals: Week 1:  OT Short Term Goal 1 (Week 1): Pt will maintain sustained attention for 30 mins with no more than min instructional cueing during ADL tasks. OT Short Term Goal 2 (Week 1): Pt will complete UB selfcare in sitting with supervision. OT Short Term Goal 3 (Week 1): Pt will complete LB dressing sit to stand with mod assist. OT Short Term Goal 4 (Week 1): Pt will complete toilet transfers stand pivot with the RW with min assist. Week 2:     Skilled Therapeutic Interventions/Progress Updates:    1:1 Pt received in the tilt in space w/c. Pt taken to the gym. Performed stand pivot transfer to the mat with mod A. Engaged in working on static sitting balance, functional reach, following one step directions. Pt required min to max A for sitting balance with strong posterior lean and to the left. Pt with awareness of falling to the left but unable to self correct. Pt required hand over hand occasional support to following through with reaching and obtaining object on table top and then reaching to the right to put them in the box- overall max A but was awake and alert for the entire session.   Pt returned back to the room and performed stand pivot transfer with mod A to the toilet with BSC over it. Pt had been incontinent of urine but able to void on the toilet. Hygiene and clothing change required total A. Pt transferred back to the w.c and then into bed with mod A for each transfer. Returned to supine with max A. Left resting in the bed. Pt's son present and discussed her progress and goals and d/c plans.  Therapy Documentation Precautions:  Precautions Precautions: Fall Precaution Comments: diplopia Restrictions Weight Bearing Restrictions: No Pain:  c/o  minor back pain but helped with repositioning.     Therapy/Group: Individual Therapy  Willeen Cass [facility] 06/22/2019, 3:43 PM

## 2019-06-22 NOTE — Progress Notes (Signed)
Team Conference Report to Patient/Family  Team Conference discussion was reviewed with the caregiver/son, including goals, any changes in plan of care and target discharge date of 07/06/19 at a Minimal assistance level (patient 75%/25% helper).  Son expressed understanding and is in agreement.  The son plans to review information with his father and discuss how they will manage the assistance the patient will need at discharge and resources available to them in more detail. Reviewed [place] services and DME coverage along with assistance available by private pay. Son stated an understanding of the information reviewed.  Tanya Knight B 06/22/2019, 2:24 PM

## 2019-06-22 NOTE — Progress Notes (Signed)
Physical Therapy Weekly Progress Note  Patient Details  Name: Tanya Knight MRN: [id number] Date of Birth: 14-Mar-1941  Beginning of progress report period: June 15, 2019 End of progress report period: June 22, 2019  Today's Date: 06/22/2019 PT Individual Time: 0930-1015 PT Individual Time Calculation (min): 45 min   Patient has met 4 of 5 short term goals. Pt is making slow progress with OOB activity however continues to be limited by fatigue and lethargy. Pt requires min-mod assist for bed mobility, min assist for sitting balance, mod assist for transfers and mod assist +2 for gait up to 10 ft with B HHA. Will continue to progress out of bed mobility and activity tolerance.    Patient continues to demonstrate the following deficits muscle weakness, decreased coordination, decreased initiation, decreased attention and decreased safety awareness and decreased sitting balance, decreased standing balance, decreased postural control and decreased balance strategies and therefore will continue to benefit from skilled PT intervention to increase functional independence with mobility.  Patient progressing toward long term goals..  Plan of care revisions: Pt's frequency has been changed to 15/7 secondary to patients lethargy over the past week. Pt is slowly progressing with OOB activity however can't tolerate a full 3 hours of therapy per day. . Long term goals remain appropriate at this time, will reassess next week.   PT Short Term Goals Week 1:  PT Short Term Goal 1 (Week 1): Pt will participate in static standing balance with modA PT Short Term Goal 1 - Progress (Week 1): Met PT Short Term Goal 2 (Week 1): Pt will demonstrate improved attention to task requiring stimulation <50% during session PT Short Term Goal 2 - Progress (Week 1): Met PT Short Term Goal 3 (Week 1): Pt will perform sit<>stand transfer with minA PT Short Term Goal 3 - Progress (Week 1): Progressing toward goal PT Short  Term Goal 4 (Week 1): Pt will perform dynamic sitting balance with minA PT Short Term Goal 4 - Progress (Week 1): Met PT Short Term Goal 5 (Week 1): Pt will initiate gait training PT Short Term Goal 5 - Progress (Week 1): Met Week 2:  PT Short Term Goal 1 (Week 2): Pt will ambulate x 20 ft with LRAD and mod assist PT Short Term Goal 2 (Week 2): Pt will perform bed<>chair transfer with min assist PT Short Term Goal 3 (Week 2): Pt will tolertate OOB activity x 45 minutes while fully participating in therapy session PT Short Term Goal 4 (Week 2): Pt will maintain static sitting balance with CGA  Skilled Therapeutic Interventions/Progress Updates:    Pt supine in bed upon PT arrival, pt alert and talking to doctor when therapists arrives. Pt covering her R eye with her hand, therapist asks if patient is seeing double, pt reports "I think so". Pt able to report accurately the number of fingers therapist is holding up. Pt does report a headache again today but does not rate. Minimized direct sunlight in the room by closing blinds in order to help with headache, pt appears to have some light sensitivity. Pt continues to need increased time for following commands and initiation with all mobility. Therapist donned teds and socks total assist. Pt performed rolling this session with min assist and increased time for initiation, cues for cues of bedrails and techniques while therapist pulled pants over hips total assist. Pt transferred to sitting EOB with mod assist. Beginning of session focused on sitting balance while patient also participated in visual testing.  Assessed visual smooth pursuits, pt unable to fully track and with some decreased accuracy following finger, and decreased occular range noted. Pt does report some light headedness and dizziness, no nystagmus noted with any mobility today, pt could potentially have motion sensitivity. Vitals assess- BP 128/87, HR 95 bpm. Pt continues to report some double  vision, donned R eye patch. While sitting EOB pt initially with posterior lean requiring mod assist to prevent LOB and correct. With B UE support on therapist's shoulders pt able to maintain static sitting balance with CGA. Pt able to participate in dynamic sitting balance task with single UE support while reaching with other UE outside BOS, overall min assist for balance. Pt performed sit>stand this session with mod assist +2 HHA. In static standing pt able to maintain balance with mod assist, continues with posterior lean. Pt ambulated  X 10 ft this session with B HHA and mod assist +2, pt with short shuffled steps, narrow BOS and proximal postural instability noted. Pt left in TIS w/c, therapist adjusted leg rests and head rest to promote better positioning. Attempted to work on seated activity however pt continues to keep eyes closed now with increased fatigue, opening eyes to name. Pt missed 15 minutes of skilled therapy tx secondary to fatigue, left reclined in TIS w/c to work on OOB activity tolerance with chair alarm set and needs in reach.   Therapy Documentation Precautions:  Precautions Precautions: Fall Precaution Comments: diplopia Restrictions Weight Bearing Restrictions: No General:     Therapy/Group: Individual Therapy  Netta Corrigan, PT, DPT, CSRS 06/22/2019, 7:37 AM

## 2019-06-22 NOTE — Patient Care Conference (Signed)
Inpatient RehabilitationTeam Conference and Plan of Care Update Date: 06/22/2019   Time: 10:40 AM   Patient Name: Tanya Knight      Medical Record Number: [id number]  Date of Birth: May 07, 1941 Sex: Female         Room/Bed: 4W21C/4W21C-01 Payor Info: Payor: [employer] MEDICARE / Plan: [employer] MEDICARE CHOICE PPO / Product Type: *No Product type* /    Admit Date/Time:  06/14/2019  3:24 PM  Primary Diagnosis:  ICH (intracerebral hemorrhage) [facility])  Patient Active Problem List   Diagnosis Date Noted  . Hyperlipidemia 06/14/2019  . Cerebellar vermis ICH with SDH and SAH, likely hypertensive 06/10/2019  . Hyponatremia 09/08/2014  . Advance directive discussed with patient 09/08/2014  . Routine general medical examination at a health care facility 09/03/2011  . Essential hypertension, benign 10/12/2006  . GERD 10/12/2006  . Osteoporosis 10/12/2006  . Seizure disorder ([place]) 10/12/2006    Expected Discharge Date: Expected Discharge Date: 07/06/19  Team Members Present: Physician leading conference: Dr. Alysia Penna Social Worker Present: Lennart Pall, LCSW Nurse Present: Dorien Chihuahua, RN;Other (comment)(Marie Poynter, RN) Case Manager: Karene Fry, RN PT Present: Michaelene Song, PT OT Present: Willeen Cass, OT SLP Present: Jettie Booze, CF-SLP PPS Coordinator present : Ileana Ladd, Burna Mortimer, SLP     Current Status/Progress Goal Weekly Team Focus  Bowel/Bladder   Inc of bowel and bladder  Train B&B  assess toiletting q shift and prn   Swallow/Nutrition/ Hydration   Dys 3/thin, full supervision mainly due to lethargy and need for assistance self-feeding to preseve some endurance  Supervision A  tolerance current diet, trials of regular if appropriate (determined by lethargy and endurance)   ADL's   Min to mod assist for UB bathing and dressing sitting unsupported.  Frequent LOB to the right and posteriorly.  Mod to max assist for LB bathing and dressing sit to stand.  Max  assist for stand pivot transfers to the wheelchair.  Visual deficits including scanning issues as well as AROM and diplopia.  Ataxic movements in the LUE greater than right.  currently contact guard overall at this time  selfcare retraining, transfer training, balance retraining, neuromuscular re-education, DME education, therapeutic activites, visual treatment.   Mobility   Limited progression this week due to significant pt lethargy limiting participation in therapy sessions - mod assist bed mobility, mod/max assist trunk control sitting EOB due to posterior lean; mod/max assist squat pivot transfers; unable to progress to standing/ambulation  supervision - pending progress may need to downgrade goals  bed mobility training, transfer training, trunk control in sitting, activity tolerance, attention   Communication   intelligibilty ranges 50-90% intelligible depending on lethargy and total verbal output  Supervision A  use of increased rate of speech and vocal intensity for greater intelligibility   Safety/Cognition/ Behavioral Observations  Mod A initiation, emergent awareness, recall  Supervision A  initiation, basic problem solving, emergent awareness, recall   Pain   Pt currently complains of no pain.   Maintain pain <3  Assess pain q shift and prn   Skin   scatteree bruisong, abrasions bilateeral, elbows, upper lip and hands.  remain free of breakdown and infection   assess q shift and prn     Rehab Goals Patient on target to meet rehab goals: Yes *See Care Plan and progress notes for long and short-term goals.     Barriers to Discharge  Current Status/Progress Possible Resolutions Date Resolved   Nursing  PT                    OT                  SLP                SW Inaccessible home environment;Decreased caregiver support;Incontinence Home with spouse and son will assist as needed Son assisting both parents as needed; spouse walks with a cane           Discharge Planning/Teaching Needs:  Home with spouse and son  TBD   Team Discussion: Less lethargic, more oriented, BP up, nephrology following, meds adjusted.  Rn inc B/B, IV fluids going, bruising.  OT min/mod UB ADL, mod/max LB ADL, diplopia, ataxia, goals min guard, may downgrade to min A.  PT fatigue limits her, sitting balance min/mod A, needs cuing and increased time, mod A 10', +2 HHA, mod A transfers.  SLP fatigue, mod/max basic cognition, downgraded diet last week, D3, not eating much.   Revisions to Treatment Plan: N/A     Medical Summary Current Status: remains incont, pain is better, no skin breakdown, requires IVF ofor e lyte abnormality Weekly Focus/Goal: Work on endurance  Barriers to Discharge: Medical stability;Other (comments)  Barriers to Discharge Comments: fatigue Possible Resolutions to Barriers: Nephrology assist   Continued Need for Acute Rehabilitation Level of Care: The patient requires daily medical management by a physician with specialized training in physical medicine and rehabilitation for the following reasons: Direction of a multidisciplinary physical rehabilitation program to maximize functional independence : Yes Medical management of patient stability for increased activity during participation in an intensive rehabilitation regime.: Yes Analysis of laboratory values and/or radiology reports with any subsequent need for medication adjustment and/or medical intervention. : Yes   I attest that I was present, lead the team conference, and concur with the assessment and plan of the team.   Jodell Cipro M 06/22/2019, 8:47 PM   Team conference was held via web/ teleconference due to [place] - 19

## 2019-06-22 NOTE — Progress Notes (Signed)
Patient ID: Tanya Knight, female   DOB: May 13, 1940, 79 y.o.   MRN: [id number] [facility] Progress Note   Assessment/ Plan:   1.  Hyponatremia: This appears to be euvolemic to possibly slightly hypovolemic.    Clinically consistent with SIADH versus nonosmotic ADH release.  No clear evidence of hypothyroidism or adrenal insufficiency.  Serum/urine electrolytes pointing to SIADH likely as a consequence of recent CVA.  Oral intake remains suboptimal and she is now on caloric supplementation with Prostat.  I will decrease rate of LR with goal to discontinue fluids in the next 24 to 48 hours as her oral intake improves. 2.  Hypokalemia: Suspected to be total body deficit likely in the setting of poor oral intake and increased sodium delivery to distal tubal from ongoing normal saline.  Will give additional dose of potassium today. 3.  Hypomagnesemia: Likely total body deficit compounded by ongoing PPI use (chronic).    On oral magnesium supplement with borderline low magnesium stores. 4.  Hypertension:  Pressures fairly controlled on amlodipine and metoprolol, intermittent elevations noted to be related to pain/discomfort. 5.  Status post hemorrhagic stroke: Ongoing inpatient rehabilitation with PT/OT/speech therapy.  Subjective:   Reports to have had a comfortable night with intermittent pain.   Objective:   BP (!) 165/96 (BP Location: Left Arm)   Pulse 74   Temp 97.8 F (36.6 C) (Oral)   Resp 16   Wt 55.8 kg   SpO2 99%   BMI 23.24 kg/m   Intake/Output Summary (Last 24 hours) at 06/22/2019 0837 Last data filed at 06/22/2019 0817 Gross per 24 hour  Intake 1980 ml  Output 300 ml  Net 1680 ml   Weight change:   Physical Exam: Gen: Appears to be comfortable resting in bed, visibly fatigued CVS: Pulse regular rhythm, normal rate, S1 and S2 normal Resp: Poor inspiratory effort with decreased breath sounds over bases, no rales/rhonchi Abd: Soft, flat, nontender Ext: Trace  ankle edema  Imaging: No results found.  Labs: BMET Recent Labs  Lab 06/17/19 0810 06/18/19 1018 06/18/19 2036 06/19/19 0533 06/20/19 0508 06/21/19 0857 06/22/19 0524  NA 125* 131* 133* 130* 131* 133* 133*  K 2.6* 2.4* 2.9* 2.7* 3.6 3.1* 3.8  CL 88* 98 99 98 99 101 104  CO2 27 21* 25 24 21* 20* 18*  GLUCOSE 116* 135* 110* 104* 108* 122* 103*  BUN 10 10 8  5* <5* 9 13  CREATININE 0.43* 0.51 0.53 0.47 0.46 0.55 0.44  CALCIUM 9.1 8.6* 8.5* 8.5* 8.9 9.0 8.9  PHOS  --   --   --   --   --  2.0* 2.5   CBC Recent Labs  Lab 06/20/19 0508  WBC 12.5*  NEUTROABS 9.4*  HGB 13.3  HCT 37.6  MCV 85.3  PLT 286   Medications:    . amLODipine  10 mg Oral Daily  . divalproex  500 mg Oral Q12H  . feeding supplement (PRO-STAT SUGAR FREE 64)  30 mL Oral BID  . heparin  5,000 Units Subcutaneous BID  . magnesium oxide  400 mg Oral TID  . methylPREDNISolone (SOLU-MEDROL) injection  10 mg Intravenous Daily  . metoprolol succinate  50 mg Oral Daily  . pantoprazole  40 mg Oral Daily  . potassium chloride  20 mEq Oral BID  . senna-docusate  1 tablet Oral BID  . topiramate  25 mg Oral BID   Elmarie Shiley, MD 06/22/2019, 8:37 AM

## 2019-06-22 NOTE — Plan of Care (Signed)
  Problem: Consults Goal: RH STROKE PATIENT EDUCATION Description: See Patient Education module for education specifics  Outcome: Progressing   Problem: RH BOWEL ELIMINATION Goal: RH STG MANAGE BOWEL WITH ASSISTANCE Description: STG Manage Bowel with min Assistance. Outcome: Progressing Goal: RH STG MANAGE BOWEL W/MEDICATION W/ASSISTANCE Description: STG Manage Bowel with Medication with min Assistance. Outcome: Progressing   Problem: RH BLADDER ELIMINATION Goal: RH STG MANAGE BLADDER WITH ASSISTANCE Description: STG Manage Bladder With min Assistance Outcome: Progressing   Problem: RH SKIN INTEGRITY Goal: RH STG SKIN FREE OF INFECTION/BREAKDOWN Description: Patients skin will remain free from further infection or breakdown with min assist. Outcome: Progressing Goal: RH STG MAINTAIN SKIN INTEGRITY WITH ASSISTANCE Description: STG Maintain Skin Integrity With min Assistance. Outcome: Progressing Goal: RH STG ABLE TO PERFORM INCISION/WOUND CARE W/ASSISTANCE Description: STG Able To Perform Incision/Wound Care With min/mod Assistance. Outcome: Progressing   Problem: RH SAFETY Goal: RH STG ADHERE TO SAFETY PRECAUTIONS W/ASSISTANCE/DEVICE Description: STG Adhere to Safety Precautions With min Assistance/Device. Outcome: Progressing   Problem: RH KNOWLEDGE DEFICIT Goal: RH STG INCREASE KNOWLEDGE OF HYPERTENSION Description: Patient/caregiver will verbalize understanding of management of HTN including diet, exercise, medications, monitoring, and follow up care with min assist. Outcome: Progressing Goal: RH STG INCREASE KNOWLEGDE OF HYPERLIPIDEMIA Description: Patient/caregiver will verbalize understanding of management of HLD including diet, exercise, medications, monitoring, and follow up care with min assist. Outcome: Progressing Goal: RH STG INCREASE KNOWLEDGE OF STROKE PROPHYLAXIS Description: Patient/caregiver will verbalize understanding of management of stroke prophylaxis  including diet, exercise, medications, monitoring, and follow up care with min assist. Outcome: Progressing

## 2019-06-22 NOTE — Progress Notes (Signed)
Speech Language Pathology Weekly Progress and Session Note  Patient Details  Name: Tanya Knight MRN: [id number] Date of Birth: June 14, 1940  Beginning of progress report period: June 15, 2019 End of progress report period: June 22, 2019  Today's Date: 06/22/2019 SLP Individual Time: 7824-2353 SLP Individual Time Calculation (min): 45 min  Short Term Goals: Week 1: SLP Short Term Goal 1 (Week 1): (not able to target at this time due to severe deficits and lethargy) SLP Short Term Goal 1 - Progress (Week 1): Discontinued (comment) SLP Short Term Goal 2 (Week 1): Pt will recall new and daily information with Min A verbal/visual cues for use of compensatory strategies. SLP Short Term Goal 2 - Progress (Week 1): Progressing toward goal SLP Short Term Goal 3 (Week 1): Pt will provide intelligible word and phrase level verbal responses to questions in 50% opportunities with Mod A cues. SLP Short Term Goal 3 - Progress (Week 1): Met SLP Short Term Goal 4 (Week 1): Pt will consume current diet with minimal overt s/sx aspiration and demonstrate efficient mastication and oral clearance with Mod A verbal/visual cues SLP Short Term Goal 4 - Progress (Week 1): Met SLP Short Term Goal 5 (Week 1): Pt will initiate during functional familiar tasks in 75% opportunities with Max A multimodal cues. SLP Short Term Goal 5 - Progress (Week 1): Progressing toward goal    New Short Term Goals: Week 2: SLP Short Term Goal 1 (Week 2): Pt will consume current diet with minimal overt s/sx aspiration and demonstrate efficient mastication and oral clearance with Min A verbal/visual cues. SLP Short Term Goal 2 (Week 2): Pt will provide intelligible word and phrase level verbal responses to questions in 75% opportunities with Mod A cues for use of increased rate of speech and vocal intensity. SLP Short Term Goal 3 (Week 2): Pt will initiate during functional familiar tasks in 50% opportunities with Mod A  multimodal cues. SLP Short Term Goal 4 (Week 2): Pt will demonstrate emergent awareness evidenced by her ability to detect verbal and/or functional errors during familiar tasks with Mod A visual/verbal cues. SLP Short Term Goal 5 (Week 2): Pt will demonstrate basic problem solving during functional and familiar tasks with Mod A verbal/visual cues.  Weekly Progress Updates: Pt has been limited in her ability to participate in therapy this week due to increased lethargy and medical changes. As a result, 1 short term goal was discontinued (as it was no longer appropriate to target), however she did meet 2 out of 4 other applicable short term goals this reporting period. Pt's diet was downgraded from regular textures to Dysphagia 3 (mech soft) due to inefficient mastication of regular solids and limited intake due to lack of endurance. Pt is currently consuming Dysphagia 3 diet with thin liquids and is using swallow strategies with Min A cues from clinician. Pt is currently Max assist for cognition and communication due to dysarthria impacting intelligibility (and further impacted by lethargy) and cognitive impairments impacting initiation, arousal, short term recall, basic problem solving, and emergent awareness. Pt has demonstrated improved verbal reposes and initiation this week, when fully alert. Pt and family education is ongoing. Pt would continue to benefit from skilled ST while inpatient in order to maximize functional independence and reduce burden of care prior to discharge. Anticipate that pt will need 24/7 supervision at discharge in addition to [place] follow up at next level of care.      Intensity: Minumum of 1-2 x/day, 30 to  90 minutes Frequency: 3 to 5 out of 7 days Duration/Length of Stay: 2 weeks Treatment/Interventions: Cognitive remediation/compensation;Cueing hierarchy;Functional tasks;Patient/family education;Speech/Language facilitation;Internal/external aids;Dysphagia/aspiration  precaution training   Daily Session  Skilled Therapeutic Interventions: Pt was seen for skilled ST targeting dysphagia and cognitive goals. SLP provided skilled observation of pt consuming puree and soft solids (dys 3) from her breakfast tray, as well as thin liquids. Pt continues to fatigue during self-feeding, requiring Mod-Max assist for feeding to preserve endurance. No overt s/sx aspiration were observed across intake of solids or liquids, however Min A lingual residue of solids required Moderate verbal cues from clinician for use of lingual wash and liquid sweep for oral clearance. Pt required overall Max A multimodal cues for task initiation throughout both informal and structured activities. Max A for problem solving and error awareness were required for pt to sort XL cards by color (black or red). She also continues to present with delayed processing of auditory and other instruction, therefore requiring extra time to complete all task and communicate. Pt was left laying in bed with alarm set and needs within reach. Continue per current plan of care.          Pain Pain Assessment Pain Scale: Faces Faces Pain Scale: No hurt  Therapy/Group: Individual Therapy  Arbutus Leas 06/22/2019, 7:17 AM

## 2019-06-23 ENCOUNTER — Inpatient Hospital Stay (HOSPITAL_COMMUNITY): Payer: Medicare PPO | Admitting: Physical Therapy

## 2019-06-23 ENCOUNTER — Inpatient Hospital Stay (HOSPITAL_COMMUNITY): Payer: Medicare PPO | Admitting: Speech Pathology

## 2019-06-23 ENCOUNTER — Inpatient Hospital Stay (HOSPITAL_COMMUNITY): Payer: Medicare PPO | Admitting: Occupational Therapy

## 2019-06-23 NOTE — Progress Notes (Signed)
Speech Language Pathology Daily Session Note  Patient Details  Name: HAMSIKA MILAN MRN: [id number] Date of Birth: 1941-05-09  Today's Date: 06/23/2019 SLP Individual Time: [id number] SLP Individual Time Calculation (min): 30 min  Short Term Goals: Week 2: SLP Short Term Goal 1 (Week 2): Pt will consume current diet with minimal overt s/sx aspiration and demonstrate efficient mastication and oral clearance with Min A verbal/visual cues. SLP Short Term Goal 2 (Week 2): Pt will provide intelligible word and phrase level verbal responses to questions in 75% opportunities with Mod A cues for use of increased rate of speech and vocal intensity. SLP Short Term Goal 3 (Week 2): Pt will initiate during functional familiar tasks in 50% opportunities with Mod A multimodal cues. SLP Short Term Goal 4 (Week 2): Pt will demonstrate emergent awareness evidenced by her ability to detect verbal and/or functional errors during familiar tasks with Mod A visual/verbal cues. SLP Short Term Goal 5 (Week 2): Pt will demonstrate basic problem solving during functional and familiar tasks with Mod A verbal/visual cues.  Skilled Therapeutic Interventions: Pt was seen for skilled ST targeting cognitive goals. Pt with significantly improved level of alertness, sustained attention, and ability to fully participate in therapy this morning in comparison to last 2 sessions. Pt initiated during 100% of functional tasks and initiated verbal responses in ~80% of opportunities. Pt also with improved speech intelligibility due to independent use of slightly increased rate and intensity of speech today. Although pt did not recall card sorting task from previous session, she sorted cards by color (black or red) with 90% accuracy when provided Min A verbal cues for problem solving and verbal stimulation to maintain arousal, as well as Mod A verbal cues for recall and redirection to task. Pt continues to required overall Max A verbal and  visual cues for recall of functional information such as whether or not she received morning medications, etc. Prior to leaving room, pt spontaneously requested therapist adjust thermostat, also a good indication of improved ability to initiate and problem solve today. Pt left laying in bed with alarm set and needs within reach. Continue per current plan of care.          Pain Pain Assessment Pain Scale: Faces Faces Pain Scale: Hurts a little bit Pain Type: Acute pain Pain Location: Head Pain Descriptors / Indicators: Headache Pain Onset: Gradual Pain Intervention(s): Other (Comment)(RN has already administered pain medication) Multiple Pain Sites: No  Therapy/Group: Individual Therapy  Arbutus Leas 06/23/2019, 10:17 AM

## 2019-06-23 NOTE — Progress Notes (Signed)
Physical Therapy Session Note  Patient Details  Name: Tanya Knight MRN: [id number] Date of Birth: 04-24-1941  Today's Date: 06/23/2019 PT Individual Time: 1500-1510 PT Individual Time Calculation (min): 10 min   Short Term Goals: Week 2:  PT Short Term Goal 1 (Week 2): Pt will ambulate x 20 ft with LRAD and mod assist PT Short Term Goal 2 (Week 2): Pt will perform bed<>chair transfer with min assist PT Short Term Goal 3 (Week 2): Pt will tolertate OOB activity x 45 minutes while fully participating in therapy session PT Short Term Goal 4 (Week 2): Pt will maintain static sitting balance with CGA  Skilled Therapeutic Interventions/Progress Updates:   Pt in supine and engaged in conversation about activities she has already participated in today. Asked pt about what she wanted to work on during our session, pt said she wanted to stay in bed. Asked to at least get up to TIS and pt stated no. Provided max encouragement and pt's son also present to encourage pt, pt continued to decline. Stated she was too tired. Pt also keeping eyes closed while talking w/ therapist, did not open despite lights on and talking to therapist. Pt remained in supine, all needs in reach. Missed 20 min of skilled PT 2/2 fatigue.   Therapy Documentation Precautions:  Precautions Precautions: Fall Precaution Comments: diplopia Restrictions Weight Bearing Restrictions: No General: PT Amount of Missed Time (min): 20 Minutes PT Missed Treatment Reason: Patient fatigue Vital Signs: Therapy Vitals Temp: 97.6 F (36.4 C) Pulse Rate: 77 Resp: 16 BP: 103/77 Patient Position (if appropriate): Lying Oxygen Therapy SpO2: 100 % O2 Device: Room Air Pain: Pain Assessment Pain Scale: Faces Pain Score: 0-No pain  Therapy/Group: Individual Therapy  Khali Albanese Clent Demark 06/23/2019, 3:24 PM

## 2019-06-23 NOTE — Progress Notes (Signed)
[facility] PHYSICAL MEDICINE & REHABILITATION PROGRESS NOTE   Subjective/Complaints:  As discussed with  nephro may be able to d/c lactated ringers today  ROS- no CP, SOB, no abd pain Denies heartburn   Objective:   No results found. No results for input(s): WBC, HGB, HCT, PLT in the last 72 hours. Recent Labs    06/21/19 0857 06/22/19 0524  NA 133* 133*  K 3.1* 3.8  CL 101 104  CO2 20* 18*  GLUCOSE 122* 103*  BUN 9 13  CREATININE 0.55 0.44  CALCIUM 9.0 8.9    Intake/Output Summary (Last 24 hours) at 06/23/2019 0845 Last data filed at 06/23/2019 0757 Gross per 24 hour  Intake 440 ml  Output --  Net 440 ml     Physical Exam: Vital Signs Blood pressure (!) 152/77, pulse 67, temperature 97.6 F (36.4 C), resp. rate 18, weight 55.8 kg, SpO2 100 %. General: No acute distress,  A little less lethargic, eyes open entire conversation; in bed  Mood and affect are appropriate Heart: Regular rate and rhythm no rubs murmurs or extra sounds Lungs: Clear to auscultation, breathing unlabored, no rales or wheezes Abdomen: Positive bowel sounds, soft nontender to palpation, nondistended Extremities: No clubbing, cyanosis, or edema Skin: No evidence of breakdown, no evidence of rash Neurologic: oriented to person place time (month not day) and situation  motor strength is 5/5 in bilateral deltoid, bicep, tricep, grip, hip flexor, knee extensors, ankle dorsiflexor and plantar flexor Sensory exam normal sensation to light touch and proprioception in bilateral upper and lower extremities Cerebellar exam normal finger to nose to finger as well as heel to shin in bilateral upper and lower extremities Musculoskeletal: Full range of motion in all 4 extremities. No joint swelling   Assessment/Plan: 1. Functional deficits secondary to Cerebellar vermis ICH which require 3+ hours per day of interdisciplinary therapy in a comprehensive inpatient rehab setting.  Physiatrist is providing close  team supervision and 24 hour management of active medical problems listed below.  Physiatrist and rehab team continue to assess barriers to discharge/monitor patient progress toward functional and medical goals  Care Tool:  Bathing    Body parts bathed by patient: Right arm, Left arm, Chest, Abdomen, Right upper leg, Left upper leg, Right lower leg, Left lower leg, Face   Body parts bathed by helper: Front perineal area, Buttocks Body parts n/a: Right upper leg, Left upper leg, Right lower leg, Left lower leg, Abdomen, Chest, Left arm, Right arm(did not attempt)   Bathing assist Assist Level: Moderate Assistance - Patient 50 - 74%     Upper Body Dressing/Undressing Upper body dressing   What is the patient wearing?: Pull over shirt    Upper body assist Assist Level: Moderate Assistance - Patient 50 - 74%    Lower Body Dressing/Undressing Lower body dressing      What is the patient wearing?: Incontinence brief, Pants     Lower body assist Assist for lower body dressing: Maximal Assistance - Patient 25 - 49%     Toileting Toileting    Toileting assist Assist for toileting: Dependent - Patient 0%     Transfers Chair/bed transfer  Transfers assist  Chair/bed transfer activity did not occur: Safety/medical concerns  Chair/bed transfer assist level: 2 Helpers     Locomotion Ambulation   Ambulation assist   Ambulation activity did not occur: Safety/medical concerns  Assist level: 2 helpers(mod +2 HHA) Assistive device: Hand held assist Max distance: 10 ft   Walk  10 feet activity   Assist  Walk 10 feet activity did not occur: Safety/medical concerns  Assist level: 2 helpers Assistive device: Hand held assist   Walk 50 feet activity   Assist Walk 50 feet with 2 turns activity did not occur: Safety/medical concerns         Walk 150 feet activity   Assist Walk 150 feet activity did not occur: Safety/medical concerns         Walk 10 feet on  uneven surface  activity   Assist Walk 10 feet on uneven surfaces activity did not occur: Safety/medical concerns         Wheelchair     Assist Will patient use wheelchair at discharge?: No(Not anticipated; no LT goals set)   Wheelchair activity did not occur: Safety/medical concerns         Wheelchair 50 feet with 2 turns activity    Assist    Wheelchair 50 feet with 2 turns activity did not occur: Safety/medical concerns       Wheelchair 150 feet activity     Assist  Wheelchair 150 feet activity did not occur: Safety/medical concerns       Blood pressure (!) 152/77, pulse 67, temperature 97.6 F (36.4 C), resp. rate 18, weight 55.8 kg, SpO2 100 %.    1.  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  More alert today, oriented to person, place , month and "cerebral hemorrhage"  Cont PT< OT, SLP 15/7             -patient may  shower          2.  Antithrombotics: -DVT/anticoagulation: Subcutaneous heparin initiated 06/11/2019             -antiplatelet therapy: N/A 3. Pain Management: Continue Tramadol as needed, Fioricet as needed for headaches 4. Mood: Provide emotional support             -antipsychotic agents: N/A 5. Neuropsych: This patient is NOT capable of making decisions on her own behalf. 6. Skin/Wound Care: Continue routine skin checks 7. Fluids/Electrolytes/Nutrition: Routine in and outs with follow-up chemistries 8.  Hypertension.  Norvasc 10 mg daily, Toprol-XL 50 mg daily.  Monitor with increased mobility- monitor closely.    Vitals:   06/23/19 0819 06/23/19 0823  BP: (!) 152/77 (!) 152/77  Pulse: 67   Resp:    Temp:    SpO2:    some lability , check orhtostatics , cont IV poor po intake  9.  History of seizure disorder.  Continue Depakote 500 mg every 12 hours For old seizure d/o as well as  prevention of new seizures 10. Diplopia- suggest eye patch intermittently to help with double vision 11. Hypokalemia- last K+ 2.6- will supplement with K CL IV riders  will monitor, also with mild hyponatremia , not on diuretics   2/6- gave KCl 40 mEq x3 in 24 hours and also 10 mEq hourly by IVFs  E lyte abnormaility, probable SIADH , low K+, Mg+++ and Na+ ask nephro to consult, BUN/Creat ok, urine 2/3 without proteinuria-  2/7- K+ up to 2.7- will give another KCl 40 mEq x3 and also add Mg level in AM, and Mg Oxide 400 mg BID since likely low, since K+ won't improve; labs in AM Hyponatremia- As per nephro, now receiving lactated ringers   2/6- Na up to 131- doing better- con't  NS IVFs  2/7- Na back down to 130- will con't IVFs and recheck in AM BMP Latest Ref Rng & Units 06/22/2019 06/21/2019 06/20/2019  Glucose 70 - 99 mg/dL 103(H) 122(H) 108(H)  BUN 8 - 23 mg/dL 13 9 <5(L)  Creatinine 0.44 - 1.00 mg/dL 0.44 0.55 0.46  Sodium 135 - 145 mmol/L 133(L) 133(L) 131(L)  Potassium 3.5 - 5.1 mmol/L 3.8 3.1(L) 3.6  Chloride 98 - 111 mmol/L 104 101 99  CO2 22 - 32 mmol/L 18(L) 20(L) 21(L)  Calcium 8.9 - 10.3 mg/dL 8.9 9.0 8.9    12. Severe daily headaches due to ICH-Improved ,  now on BID Topamax, some HA today but did not sleep well due to freq urination , likely from IVF  13. Nausea/+/- vomiting- improved intake better 14 GERD will change PPI to H2 blocker to help with Mg+++ absorption  14. Dysphagia due to CVA   LOS: 9 days A FACE TO FACE EVALUATION WAS PERFORMED  Tanya Knight 06/23/2019, 8:45 AM

## 2019-06-23 NOTE — Progress Notes (Signed)
Occupational Therapy Session Note  Patient Details  Name: Tanya Knight MRN: [id number] Date of Birth: 1940-11-13  Today's Date: 06/23/2019 OT Individual Time: 1400-1430 OT Individual Time Calculation (min): 30 min    Short Term Goals: Week 1:  OT Short Term Goal 1 (Week 1): Pt will maintain sustained attention for 30 mins with no more than min instructional cueing during ADL tasks. OT Short Term Goal 1 - Progress (Week 1): Not met OT Short Term Goal 2 (Week 1): Pt will complete UB selfcare in sitting with supervision. OT Short Term Goal 2 - Progress (Week 1): Not met OT Short Term Goal 3 (Week 1): Pt will complete LB dressing sit to stand with mod assist. OT Short Term Goal 3 - Progress (Week 1): Not met OT Short Term Goal 4 (Week 1): Pt will complete toilet transfers stand pivot with the RW with min assist. OT Short Term Goal 4 - Progress (Week 1): Not met Week 2:  OT Short Term Goal 1 (Week 2): Pt will maintain sustained attention for 30 mins with no more than min instructional cueing during ADL tasks. OT Short Term Goal 2 (Week 2): Pt will complete UB selfcare in sitting with supervision. OT Short Term Goal 3 (Week 2): Pt will complete LB dressing sit to stand with mod assist. OT Short Term Goal 4 (Week 2): Pt will complete toilet transfers stand pivot with the RW with min assist. Week 3:     Skilled Therapeutic Interventions/Progress Updates:    1:1Pt in bed when arrived. Pt came to EOB with min A with max instructional cues for sequencing. Pt required min A to maintain balance at EOB. Used STEDY to transport into the bathroom. Pt had been incontinent of bowel and bladder and required total A for hygiene/ clean up and changing of brief; however pt able to maintain standing balance in STEDY with bilateral UE support with min guard! Pt was even able to pull up her pants with min to mod A standing balance. Transitioned in STEDY to sink and stood with min A to wash her hands with hand  over hand to turn on water and get soap. Pt brushed teeth perched on seat and then was able to come into standing with supervision to spit in the sink. Pt demonstrated sustained attention to all tasks. Sat at the end of the bed and was able to side step with therapist to the [facility] with mod A with instructional cues and mod A to facilitate a weight shift. Returned to supine with min guard with instructional cues for postioning self. Left resting with son present.   Therapy Documentation Precautions:  Precautions Precautions: Fall Precaution Comments: diplopia Restrictions Weight Bearing Restrictions: No General: General PT Missed Treatment Reason: Patient fatigue Vital Signs: Therapy Vitals Temp: 97.6 F (36.4 C) Pulse Rate: 77 Resp: 16 BP: 103/77 Patient Position (if appropriate): Lying Oxygen Therapy SpO2: 100 % O2 Device: Room Air Pain: Pain Assessment Pain Scale: Faces Pain Score: 0-No pain   Therapy/Group: Individual Therapy  Willeen Cass [facility] 06/23/2019, 4:00 PM

## 2019-06-23 NOTE — Progress Notes (Signed)
Occupational Therapy Weekly Progress Note  Patient Details  Name: Tanya Knight MRN: [id number] Date of Birth: [birth year]  Beginning of progress report period: June 15, 2019 End of progress report period: June 23, 2019  Today's Date: 06/23/2019 OT Individual Time: 1003-1057 OT Individual Time Calculation (min): 54 min    Patient has met 0 of 4 short term goals.  Pt is currently making slower progress than expected with OT at this time.  She still demonstrates decreased overall sustained attention to less than 10 mins during selfcare sessions.  At times, she will sit and close her eyes, requiring mod to max instructional cueing to maintain her attention on the task.  [name] continues to report some double vision and it is noted when she is reaching for items during bathing she will close the right eye frequently.  When asked to tell how many fingers are being held up in various areas of her vision, she is 50% right.  Because of her fluctuating attention level it has been difficult to determine exactly how well she sees or when there is diplopia present.  Will continue to examine vision further as she does tend to overshoot when reaching for objects.  [name] continues to demonstrate BUE ataxia with the LUE being more impaired at this time.  She uses both of them functionally at a diminished level.  She completes UB bathing and dressing at a min assist level in supported sitting.  When sitting unsupported, she needs max assist for sitting balance during these tasks.  Max assist is also needed for LB bathing and dressing sit to stand secondary to increased posterior lean and increased trunk flexion.  She also needs max assist for stand pivot transfers but is approaching mod assist level.  Speech is still dysarthric and slow, but she has demonstrated orientation to place, time, and situation recently.  Feel that lately she has been limited secondary to some of her medical issues related to  low potassium, sodium,and magnesium.  Feel that once this is better controlled she will progress faster.  Recommend continued CIR level OT at this time for progression to min guard assist level goals.      Patient continues to demonstrate the following deficits: muscle weakness, decreased visual motor skills and diplopia, decreased sustained attention, decreased initiation, decreased attention, decreased awareness, decreased safety awareness and delayed processing and decreased sitting balance, decreased standing balance, decreased postural control and decreased balance strategies and therefore will continue to benefit from skilled OT intervention to enhance overall performance with BADL.  Patient progressing toward long term goals..  Continue plan of care.  OT Short Term Goals Week 2:  OT Short Term Goal 1 (Week 2): Pt will maintain sustained attention for 30 mins with no more than min instructional cueing during ADL tasks. OT Short Term Goal 2 (Week 2): Pt will complete UB selfcare in sitting with supervision. OT Short Term Goal 3 (Week 2): Pt will complete LB dressing sit to stand with mod assist. OT Short Term Goal 4 (Week 2): Pt will complete toilet transfers stand pivot with the RW with min assist.  Skilled Therapeutic Interventions/Progress Updates:    Pt in bed to start session with her eyes closed,  She needed mod instructional cueing to keep them open initially.  She agreed to participate in OT session and transferred to the EOB from supine with HOB flat and mod assist.  Initial static sitting balance was min assist secondary to posterior lean.  Had  pt work on bathing and dressing tasks from the EOB.  Max assist for dynamic sitting balance while completing washing of face, arms, and legs.  Increased LOB to the right and posteriorly.  Pt with slow BUE movements when reaching for items on the bedside table with increased ataxia noted in the LUE compared to a smaller amount on the right.  Pt  still closes her eye on the right side at times when reaching, but when asked if she sees double, her responses are inconsistent.  Mod instructional cueing to sequence through bathing tasks secondary to not being able to verbally recall what therapist wanted her to wash next.  Mod assist for sit to stand and standing balance with completion of washing front and back peri area as well as pulling garments over hips.  She was able to thread her brief and pants with min assist over her LEs prior to standing.  Therapist assisted with donning TEDs and gripper socks as well.  Max assist for stand pivot transfer to the tilt in space wheelchair.  Pt left in the tilt in space wheelchair with soft touch call button and phone in reach with safety belt in place with pt resting.  Notified nursing to continue checks on pt as she was up in the wheelchair tilted up.      Therapy Documentation Precautions:  Precautions Precautions: Fall Precaution Comments: diplopia Restrictions Weight Bearing Restrictions: No  Pain: Pain Assessment Pain Scale: Faces Pain Score: 0-No pain Faces Pain Scale: Hurts a little bit Pain Type: Acute pain Pain Location: Head Pain Descriptors / Indicators: Headache Pain Onset: Gradual Pain Intervention(s): Other (Comment)(RN has already administered pain medication) Multiple Pain Sites: No ADL: See Care Tool Section for some details of mobility and selfcare  Therapy/Group: Individual Therapy  Toula Miyasaki OTR/L 06/23/2019, 1:51 PM

## 2019-06-23 NOTE — Progress Notes (Signed)
Patient ID: Tanya Knight, female   DOB: August 12, 1940, 79 y.o.   MRN: [id number] [facility] Progress Note   Assessment/ Plan:   1.  Hyponatremia: This appears to be euvolemic to possibly slightly hypovolemic.    Clinically consistent with SIADH versus nonosmotic ADH release.  No clear evidence of hypothyroidism or adrenal insufficiency.  Serum/urine electrolytes pointing to SIADH likely as a consequence of recent CVA.  Stop LR today and monitor labs. 2.  Hypokalemia: Suspected to be total body deficit likely in the setting of poor oral intake and increased sodium delivery to distal tubal from ongoing normal saline. Monitor with labs tomorrow. 3.  Hypomagnesemia: Likely total body deficit compounded by ongoing PPI use (chronic).    On oral magnesium supplement with borderline low magnesium stores. 4.  Hypertension:  Pressures fairly controlled on amlodipine and metoprolol, intermittent elevations noted to be related to pain/discomfort. 5.  Status post hemorrhagic stroke: Ongoing inpatient rehabilitation with PT/OT/speech therapy.  Subjective:   Reports to be tired this morning.   Objective:   BP (!) 152/77   Pulse 67   Temp 97.6 F (36.4 C)   Resp 18   Wt 55.8 kg   SpO2 100%   BMI 23.24 kg/m   Intake/Output Summary (Last 24 hours) at 06/23/2019 1135 Last data filed at 06/23/2019 0757 Gross per 24 hour  Intake 440 ml  Output -  Net 440 ml   Weight change:   Physical Exam: Gen: Appears to be comfortable resting in reclining wheelchair CVS: Pulse regular rhythm, normal rate, S1 and S2 normal Resp: Poor inspiratory effort with decreased breath sounds over bases, no rales/rhonchi Abd: Soft, flat, nontender Ext: Trace ankle edema  Imaging: No results found.  Labs: BMET Recent Labs  Lab 06/17/19 0810 06/18/19 1018 06/18/19 2036 06/19/19 0533 06/20/19 0508 06/21/19 0857 06/22/19 0524  NA 125* 131* 133* 130* 131* 133* 133*  K 2.6* 2.4* 2.9* 2.7* 3.6 3.1* 3.8   CL 88* 98 99 98 99 101 104  CO2 27 21* 25 24 21* 20* 18*  GLUCOSE 116* 135* 110* 104* 108* 122* 103*  BUN 10 10 8  5* <5* 9 13  CREATININE 0.43* 0.51 0.53 0.47 0.46 0.55 0.44  CALCIUM 9.1 8.6* 8.5* 8.5* 8.9 9.0 8.9  PHOS  --   --   --   --   --  2.0* 2.5   CBC Recent Labs  Lab 06/20/19 0508  WBC 12.5*  NEUTROABS 9.4*  HGB 13.3  HCT 37.6  MCV 85.3  PLT 286   Medications:    . amLODipine  10 mg Oral Daily  . divalproex  500 mg Oral Q12H  . famotidine  20 mg Oral QHS  . feeding supplement (PRO-STAT SUGAR FREE 64)  30 mL Oral BID  . heparin  5,000 Units Subcutaneous BID  . magnesium oxide  400 mg Oral TID  . metoprolol succinate  50 mg Oral Daily  . potassium chloride  20 mEq Oral BID  . senna-docusate  1 tablet Oral BID  . topiramate  25 mg Oral BID   Elmarie Shiley, MD 06/23/2019, 11:35 AM

## 2019-06-23 NOTE — Plan of Care (Signed)
  Problem: Consults Goal: RH STROKE PATIENT EDUCATION Description: See Patient Education module for education specifics  Outcome: Progressing   Problem: RH BOWEL ELIMINATION Goal: RH STG MANAGE BOWEL WITH ASSISTANCE Description: STG Manage Bowel with min Assistance. Outcome: Progressing Goal: RH STG MANAGE BOWEL W/MEDICATION W/ASSISTANCE Description: STG Manage Bowel with Medication with min Assistance. Outcome: Progressing   Problem: RH BLADDER ELIMINATION Goal: RH STG MANAGE BLADDER WITH ASSISTANCE Description: STG Manage Bladder With min Assistance Outcome: Progressing   Problem: RH SKIN INTEGRITY Goal: RH STG SKIN FREE OF INFECTION/BREAKDOWN Description: Patients skin will remain free from further infection or breakdown with min assist. Outcome: Progressing Goal: RH STG MAINTAIN SKIN INTEGRITY WITH ASSISTANCE Description: STG Maintain Skin Integrity With min Assistance. Outcome: Progressing Goal: RH STG ABLE TO PERFORM INCISION/WOUND CARE W/ASSISTANCE Description: STG Able To Perform Incision/Wound Care With min/mod Assistance. Outcome: Progressing   Problem: RH SAFETY Goal: RH STG ADHERE TO SAFETY PRECAUTIONS W/ASSISTANCE/DEVICE Description: STG Adhere to Safety Precautions With min Assistance/Device. Outcome: Progressing   Problem: RH KNOWLEDGE DEFICIT Goal: RH STG INCREASE KNOWLEDGE OF HYPERTENSION Description: Patient/caregiver will verbalize understanding of management of HTN including diet, exercise, medications, monitoring, and follow up care with min assist. Outcome: Progressing Goal: RH STG INCREASE KNOWLEGDE OF HYPERLIPIDEMIA Description: Patient/caregiver will verbalize understanding of management of HLD including diet, exercise, medications, monitoring, and follow up care with min assist. Outcome: Progressing Goal: RH STG INCREASE KNOWLEDGE OF STROKE PROPHYLAXIS Description: Patient/caregiver will verbalize understanding of management of stroke prophylaxis  including diet, exercise, medications, monitoring, and follow up care with min assist. Outcome: Progressing

## 2019-06-24 ENCOUNTER — Inpatient Hospital Stay (HOSPITAL_COMMUNITY): Payer: Medicare PPO | Admitting: Speech Pathology

## 2019-06-24 ENCOUNTER — Inpatient Hospital Stay (HOSPITAL_COMMUNITY): Payer: Medicare PPO | Admitting: Occupational Therapy

## 2019-06-24 ENCOUNTER — Inpatient Hospital Stay (HOSPITAL_COMMUNITY): Payer: Medicare PPO | Admitting: Physical Therapy

## 2019-06-24 LAB — RENAL FUNCTION PANEL
Albumin: 3.1 g/dL — ABNORMAL LOW (ref 3.5–5.0)
Anion gap: 12 (ref 5–15)
BUN: 22 mg/dL (ref 8–23)
CO2: 22 mmol/L (ref 22–32)
Calcium: 9.4 mg/dL (ref 8.9–10.3)
Chloride: 99 mmol/L (ref 98–111)
Creatinine, Ser: 0.62 mg/dL (ref 0.44–1.00)
GFR calc Af Amer: >60 mL/min (ref >60)
GFR calc non Af Amer: >60 mL/min (ref >60)
Glucose, Bld: 97 mg/dL (ref 70–99)
Phosphorus: 4.7 mg/dL — ABNORMAL HIGH (ref 2.5–4.6)
Potassium: 3.8 mmol/L (ref 3.5–5.1)
Sodium: 133 mmol/L — ABNORMAL LOW (ref 135–145)

## 2019-06-24 LAB — MAGNESIUM: Magnesium: 1.9 mg/dL (ref 1.7–2.4)

## 2019-06-24 NOTE — Plan of Care (Signed)
  Problem: Consults Goal: RH STROKE PATIENT EDUCATION Description: See Patient Education module for education specifics  Outcome: Progressing   Problem: RH BOWEL ELIMINATION Goal: RH STG MANAGE BOWEL WITH ASSISTANCE Description: STG Manage Bowel with min Assistance. Outcome: Progressing Goal: RH STG MANAGE BOWEL W/MEDICATION W/ASSISTANCE Description: STG Manage Bowel with Medication with min Assistance. Outcome: Progressing   Problem: RH BLADDER ELIMINATION Goal: RH STG MANAGE BLADDER WITH ASSISTANCE Description: STG Manage Bladder With min Assistance Outcome: Progressing   Problem: RH SKIN INTEGRITY Goal: RH STG SKIN FREE OF INFECTION/BREAKDOWN Description: Patients skin will remain free from further infection or breakdown with min assist. Outcome: Progressing Goal: RH STG MAINTAIN SKIN INTEGRITY WITH ASSISTANCE Description: STG Maintain Skin Integrity With min Assistance. Outcome: Progressing Goal: RH STG ABLE TO PERFORM INCISION/WOUND CARE W/ASSISTANCE Description: STG Able To Perform Incision/Wound Care With min/mod Assistance. Outcome: Progressing   Problem: RH SAFETY Goal: RH STG ADHERE TO SAFETY PRECAUTIONS W/ASSISTANCE/DEVICE Description: STG Adhere to Safety Precautions With min Assistance/Device. Outcome: Progressing   Problem: RH KNOWLEDGE DEFICIT Goal: RH STG INCREASE KNOWLEDGE OF HYPERTENSION Description: Patient/caregiver will verbalize understanding of management of HTN including diet, exercise, medications, monitoring, and follow up care with min assist. Outcome: Progressing Goal: RH STG INCREASE KNOWLEGDE OF HYPERLIPIDEMIA Description: Patient/caregiver will verbalize understanding of management of HLD including diet, exercise, medications, monitoring, and follow up care with min assist. Outcome: Progressing Goal: RH STG INCREASE KNOWLEDGE OF STROKE PROPHYLAXIS Description: Patient/caregiver will verbalize understanding of management of stroke prophylaxis  including diet, exercise, medications, monitoring, and follow up care with min assist. Outcome: Progressing

## 2019-06-24 NOTE — Progress Notes (Signed)
Speech Language Pathology Daily Session Note  Patient Details  Name: Tanya Knight MRN: [id number] Date of Birth: 31-Jan-1941  Today's Date: 06/24/2019 SLP Individual Time: 1030-1100 SLP Individual Time Calculation (min): 30 min and Today's Date: 06/24/2019 SLP Missed Time: 15 Minutes Missed Time Reason: Patient fatigue  Short Term Goals: Week 2: SLP Short Term Goal 1 (Week 2): Pt will consume current diet with minimal overt s/sx aspiration and demonstrate efficient mastication and oral clearance with Min A verbal/visual cues. SLP Short Term Goal 2 (Week 2): Pt will provide intelligible word and phrase level verbal responses to questions in 75% opportunities with Mod A cues for use of increased rate of speech and vocal intensity. SLP Short Term Goal 3 (Week 2): Pt will initiate during functional familiar tasks in 50% opportunities with Mod A multimodal cues. SLP Short Term Goal 4 (Week 2): Pt will demonstrate emergent awareness evidenced by her ability to detect verbal and/or functional errors during familiar tasks with Mod A visual/verbal cues. SLP Short Term Goal 5 (Week 2): Pt will demonstrate basic problem solving during functional and familiar tasks with Mod A verbal/visual cues.  Skilled Therapeutic Interventions: Skilled treatment session focused on cognitive goals. Upon arrival, patient was lethargic and required Max verbal cues for arousal. Patient required Max verbal cues for sustained attention for ~30 seconds while reading a card aloud. Max verbal cues were also needed for use of an increased vocal intensity to maximize intelligibility without success. Patient attempted to sign her name on the card and demonstrated perseveration on letters. Session ended 15 minutes early due to fatigue and lethargy. Patient left upright in bed with alarm on and all needs within reach. Continue with current plan of care.      Pain No/Denies Pain   Therapy/Group: Individual Therapy  Vasilisa Vore,  Lucinda Spells 06/24/2019, 3:22 PM

## 2019-06-24 NOTE — Progress Notes (Signed)
Patient ID: AHLYSSA SWISHER, female   DOB: 1941/05/10, 79 y.o.   MRN: [id number] [facility] Progress Note   Assessment/ Plan:   1.  Hyponatremia: Clinically consistent with SIADH versus nonosmotic ADH release along with "tea and toast phenomenon" based on multiple electrolyte deficiencies/albumin.  No clear evidence of hypothyroidism or adrenal insufficiency.  Serum/urine electrolytes pointing to SIADH likely as a consequence of recent CVA.  Anticipate sodium levels continue improving as her caloric intake improves. 2.  Hypokalemia: Suspected to be total body deficit likely in the setting of poor oral intake and increased sodium delivery to distal tubal from ongoing normal saline.  Corrected, continue to replace magnesium and optimize nutritional intake. 3.  Hypomagnesemia:  Likely total body deficit with chronic PPI use/poor intake.  Monitor off PPI and ongoing replacement. 4.  Hypertension:  Pressures fairly controlled on amlodipine and metoprolol, intermittent elevations noted to be related to pain/discomfort. 5.  Status post hemorrhagic stroke: Ongoing inpatient rehabilitation with PT/OT/speech therapy.  I will sign off at this time and remain available for questions or concerns.  Subjective:   Without acute events overnight, intermittent back pain/headache noted.   Objective:   BP (!) 131/93   Pulse 72   Temp (!) 97.5 F (36.4 C) (Oral)   Resp 16   Wt 55.8 kg   SpO2 100%   BMI 23.24 kg/m   Intake/Output Summary (Last 24 hours) at 06/24/2019 1057 Last data filed at 06/23/2019 1251 Gross per 24 hour  Intake 120 ml  Output --  Net 120 ml   Weight change:   Physical Exam: Gen: Sitting up in bed, working with speech therapy CVS: Pulse regular rhythm, normal rate, S1 and S2 normal Resp: Poor inspiratory effort with decreased breath sounds over bases, no rales/rhonchi Abd: Soft, flat, nontender Ext: Trace ankle edema  Imaging: No results  found.  Labs: BMET Recent Labs  Lab 06/18/19 1018 06/18/19 2036 06/19/19 0533 06/20/19 0508 06/21/19 0857 06/22/19 0524 06/24/19 0556  NA 131* 133* 130* 131* 133* 133* 133*  K 2.4* 2.9* 2.7* 3.6 3.1* 3.8 3.8  CL 98 99 98 99 101 104 99  CO2 21* 25 24 21* 20* 18* 22  GLUCOSE 135* 110* 104* 108* 122* 103* 97  BUN 10 8 5* <5* 9 13 22   CREATININE 0.51 0.53 0.47 0.46 0.55 0.44 0.62  CALCIUM 8.6* 8.5* 8.5* 8.9 9.0 8.9 9.4  PHOS  --   --   --   --  2.0* 2.5 4.7*   CBC Recent Labs  Lab 06/20/19 0508  WBC 12.5*  NEUTROABS 9.4*  HGB 13.3  HCT 37.6  MCV 85.3  PLT 286   Medications:    . amLODipine  10 mg Oral Daily  . divalproex  500 mg Oral Q12H  . famotidine  20 mg Oral QHS  . feeding supplement (PRO-STAT SUGAR FREE 64)  30 mL Oral BID  . heparin  5,000 Units Subcutaneous BID  . magnesium oxide  400 mg Oral TID  . metoprolol succinate  50 mg Oral Daily  . senna-docusate  1 tablet Oral BID  . topiramate  25 mg Oral BID   Elmarie Shiley, MD 06/24/2019, 10:57 AM

## 2019-06-24 NOTE — Progress Notes (Signed)
Occupational Therapy Session Note  Patient Details  Name: Tanya Knight MRN: [id number] Date of Birth: April 02, 1941  Today's Date: 06/24/2019 OT Individual Time: [id number] OT Individual Time Calculation (min): 57 min    Short Term Goals: Week 2:  OT Short Term Goal 1 (Week 2): Pt will maintain sustained attention for 30 mins with no more than min instructional cueing during ADL tasks. OT Short Term Goal 2 (Week 2): Pt will complete UB selfcare in sitting with supervision. OT Short Term Goal 3 (Week 2): Pt will complete LB dressing sit to stand with mod assist. OT Short Term Goal 4 (Week 2): Pt will complete toilet transfers stand pivot with the RW with min assist.  Skilled Therapeutic Interventions/Progress Updates:    Pt agreed to working on shower and dressing during session.  She needed mod assist for supine to sit on the left side of the bed with mod demonstrational cueing to sequence.  The Stedy was used for transfer to the shower bench in the walk-in shower secondary to time limitations.  She was able to complete sit to stand from the EOB and shower bench with the Stedy and min assist.  She needed max assist for dynamic sitting balance during bathing secondary to slow progressive forward and left lean that she could not correct without physical assist and max instructional cueing.  She also needed max instructional cueing to sequence bathing as initially she did not even try to put water on the washcloth and also needed cueing and assist to apply soap to it.  She tended to perseverate on washing the right arm and chest excessively as well as just rinsing the right shoulder.  Mod assist needed for rinsing off completely.  Sit to stand in the shower was completed with min assist but pt demonstrates posterior lean and LOB, which requires mod assist to correct.  She worked on dressing at the sink with mod assist for donning pullover shirt and severe forward LOB when attempting to pull it over her  head.  Mod assist was also needed for threading her brief and her paper scrubs.  Total assist to complete gripper socks with transfer back to the bed with mod assist stand pivot secondary to fatigue.  Pt with eyes closed and call button in reach at end of session.  Bed alarm in place as well.    Therapy Documentation Precautions:  Precautions Precautions: Fall Precaution Comments: diplopia Restrictions Weight Bearing Restrictions: No  Pain: Pain Assessment Pain Scale: Faces Pain Score: 0-No pain ADL: See Care Tool Section for some details of mobility and selfcare  Therapy/Group: Individual Therapy  Analeise Mccleery OTR/L 06/24/2019, 11:35 AM

## 2019-06-24 NOTE — Progress Notes (Signed)
Physical Therapy Session Note  Patient Details  Name: Tanya Knight MRN: [id number] Date of Birth: [birth year]  Today's Date: 06/24/2019 PT Individual Time: 1420-1453 PT Individual Time Calculation (min): 33 min   and  Today's Date: 06/24/2019 PT Missed Time: 12 Minutes Missed Time Reason: Patient fatigue  Short Term Goals: Week 2:  PT Short Term Goal 1 (Week 2): Pt will ambulate x 20 ft with LRAD and mod assist PT Short Term Goal 2 (Week 2): Pt will perform bed<>chair transfer with min assist PT Short Term Goal 3 (Week 2): Pt will tolertate OOB activity x 45 minutes while fully participating in therapy session PT Short Term Goal 4 (Week 2): Pt will maintain static sitting balance with CGA  Skilled Therapeutic Interventions/Progress Updates:    Pt received in the bed with nursing staff assisting with lunch meal - therapist to return after patient has eaten.   Upon therapist return pt supine in bed with her son, Pilar Plate, present and pt with eyes open though lethargic and agreeable to therapy session. Supine>sit, HOB elevated and using bedrail with min assist for moving LEs to EOB and trunk upright - continues to require increased time to initiate and complete task. Pt able to maintain sitting balance EOB with min assist today though continues to demonstrate posterior lean. Sit<>stands EOB<>stedy 2x with min assist for lifting and balance due to posterior lean - tolerates standing in stedy with B UE support ~1-76minutes each trial with min assist for balance and manual facilitation with multimodal cuing for increased anterior weight shift. During 2nd stand pt noted to start being incontinent of bowels. Transported to/from bathroom in stedy. Sit<>stand stedy<>BSC over toilet with min assist for lifting/lowering with pt using B UE support on stedy. Standing with min/mod assist required max assist for LB clothing management. Pt had started being incontinent of bowels in brief but able to void more on  toilet. Standing with min/mod assist for balance due to posterior lean while +2 assist completed total assist peri-care. Transported back to room in stedy and pt deferred additional therapy at this time despite encouragement due to fatigue. Sit>supine with min/mod assist for trunk descent and B LE management into the bed. Pt left supine in bed with needs in reach and bed alarm on - pt quickly falling asleep. Missed 12 minutes of skilled physical therapy.  Therapy Documentation Precautions:  Precautions Precautions: Fall Precaution Comments: diplopia Restrictions Weight Bearing Restrictions: No  Pain:   No indications of pain during session.   Therapy/Group: Individual Therapy  Tawana Scale, PT, DPT 06/24/2019, 1:01 PM

## 2019-06-24 NOTE — Progress Notes (Signed)
Patient ID: Tanya Knight, female   DOB: 08-21-1940, 79 y.o.   MRN: [id number] Met with the patient's son and spoke with the daughter (via telephone) regarding team conference report. Tentative plan is to discharge home on 07/06/19 @ Min assist overall. Reviewed with both patient's adult children, the options for care at discharge and potential to request for an extension of the rehab stay to allow continued progression and hope to reach a higher functional level for discharge as the patient "was not the same 24 hours after admission that she was upon decision to admit to rehab from acute "due to medical issues. SNF placement at that time was "not offered". Relayed the information received from [facility] regarding SNF benefits. [employer] noted potential for denial of SNF as patient was currently receiving therapy in a Rehabilitation facility. [place] (Medicare intermediary) and the potential for denial of coverage per [facility] and how to file a family appeal of denial if received. Also reviewed resources available to them should they proceed with home discharge with [facility] PT, OT and SLP; including out of pocket expense for aide/assistance for the patient's husband to provide care when other family members were out of the home. Discussed current functional status and projected level for discharge at present with another update on Wednesday 06/29/19. Therapy has also recommended use of a stedy to facilitate patient transfers. Reviewed with the family how to access DME not covered by insurance. Both state an understanding of the information reviewed and would like some time to talk about the information covered and discuss their options/plan for patient care.

## 2019-06-24 NOTE — Progress Notes (Signed)
[facility] PHYSICAL MEDICINE & REHABILITATION PROGRESS NOTE   Subjective/Complaints:  Appreciate renal note. ROS- no CP, SOB, no abd pain Denies heartburn   Objective:   No results found. No results for input(s): WBC, HGB, HCT, PLT in the last 72 hours. Recent Labs    06/22/19 0524 06/24/19 0556  NA 133* 133*  K 3.8 3.8  CL 104 99  CO2 18* 22  GLUCOSE 103* 97  BUN 13 22  CREATININE 0.44 0.62  CALCIUM 8.9 9.4    Intake/Output Summary (Last 24 hours) at 06/24/2019 0834 Last data filed at 06/23/2019 1251 Gross per 24 hour  Intake 120 ml  Output --  Net 120 ml     Physical Exam: Vital Signs Blood pressure (!) 131/93, pulse 72, temperature (!) 97.5 F (36.4 C), temperature source Oral, resp. rate 16, weight 55.8 kg, SpO2 100 %. General: No acute distress,  A little less lethargic, eyes open entire conversation; in bed  Mood and affect are appropriate Heart: Regular rate and rhythm no rubs murmurs or extra sounds Lungs: Clear to auscultation, breathing unlabored, no rales or wheezes Abdomen: Positive bowel sounds, soft nontender to palpation, nondistended Extremities: No clubbing, cyanosis, or edema Skin: No evidence of breakdown, no evidence of rash Neurologic: oriented to person place time (month not day) and situation  motor strength is 5/5 in bilateral deltoid, bicep, tricep, grip, hip flexor, knee extensors, ankle dorsiflexor and plantar flexor Sensory exam normal sensation to light touch and proprioception in bilateral upper and lower extremities Cerebellar exam normal finger to nose to finger as well as heel to shin in bilateral upper and lower extremities Musculoskeletal: Full range of motion in all 4 extremities. No joint swelling   Assessment/Plan: 1. Functional deficits secondary to Cerebellar vermis ICH which require 3+ hours per day of interdisciplinary therapy in a comprehensive inpatient rehab setting.  Physiatrist is providing close team supervision and  24 hour management of active medical problems listed below.  Physiatrist and rehab team continue to assess barriers to discharge/monitor patient progress toward functional and medical goals  Care Tool:  Bathing    Body parts bathed by patient: Right arm, Left arm, Right upper leg, Left upper leg, Right lower leg, Left lower leg, Face   Body parts bathed by helper: Front perineal area, Buttocks Body parts n/a: Abdomen, Chest(did not attempt this session)   Bathing assist Assist Level: Moderate Assistance - Patient 50 - 74%     Upper Body Dressing/Undressing Upper body dressing   What is the patient wearing?: Pull over shirt    Upper body assist Assist Level: Moderate Assistance - Patient 50 - 74%    Lower Body Dressing/Undressing Lower body dressing      What is the patient wearing?: Incontinence brief, Pants     Lower body assist Assist for lower body dressing: Maximal Assistance - Patient 25 - 49%     Toileting Toileting    Toileting assist Assist for toileting: Dependent - Patient 0%     Transfers Chair/bed transfer  Transfers assist  Chair/bed transfer activity did not occur: Safety/medical concerns  Chair/bed transfer assist level: Maximal Assistance - Patient 25 - 49%     Locomotion Ambulation   Ambulation assist   Ambulation activity did not occur: Safety/medical concerns  Assist level: 2 helpers(mod +2 HHA) Assistive device: Hand held assist Max distance: 10 ft   Walk 10 feet activity   Assist  Walk 10 feet activity did not occur: Safety/medical concerns  Assist  level: 2 helpers Assistive device: Hand held assist   Walk 50 feet activity   Assist Walk 50 feet with 2 turns activity did not occur: Safety/medical concerns         Walk 150 feet activity   Assist Walk 150 feet activity did not occur: Safety/medical concerns         Walk 10 feet on uneven surface  activity   Assist Walk 10 feet on uneven surfaces activity did  not occur: Safety/medical concerns         Wheelchair     Assist Will patient use wheelchair at discharge?: No(Not anticipated; no LT goals set)   Wheelchair activity did not occur: Safety/medical concerns         Wheelchair 50 feet with 2 turns activity    Assist    Wheelchair 50 feet with 2 turns activity did not occur: Safety/medical concerns       Wheelchair 150 feet activity     Assist  Wheelchair 150 feet activity did not occur: Safety/medical concerns       Blood pressure (!) 131/93, pulse 72, temperature (!) 97.5 F (36.4 C), temperature source Oral, resp. rate 16, weight 55.8 kg, SpO2 100 %.    1.  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  More alert today, oriented to person, place ,very poor endurance  month   Cont PT< OT, SLP 15/7             -patient may  shower          2.  Antithrombotics: -DVT/anticoagulation: Subcutaneous heparin initiated 06/11/2019             -antiplatelet therapy: N/A 3. Pain Management: Continue Tramadol as needed, Fioricet as needed for headaches 4. Mood: Provide emotional support             -antipsychotic agents: N/A 5. Neuropsych: This patient is NOT capable of making decisions on her own behalf. 6. Skin/Wound Care: Continue routine skin checks 7. Fluids/Electrolytes/Nutrition: Routine in and outs with follow-up chemistries 8.  Hypertension.  Norvasc 10 mg daily, Toprol-XL 50 mg daily.  Monitor with increased mobility- monitor closely.    Vitals:   06/24/19 0742 06/24/19 0745  BP: (!) 131/93 (!) 131/93  Pulse:  72  Resp:    Temp:    SpO2:  100%  some lability , check orhtostatics , cont IV poor po intake  9.  History of seizure disorder.  Continue Depakote 500 mg every 12 hours For old seizure d/o as well as prevention of new seizures 10. Diplopia- suggest eye patch  intermittently to help with double vision 11. Hypokalemia- last K+ 2.6- will supplement with K CL IV riders  will monitor, also with mild hyponatremia , not on diuretics   2/6- gave KCl 40 mEq x3 in 24 hours and also 10 mEq hourly by IVFs  E lyte abnormaility, probable SIADH , low K+, Mg+++ and Na+ ask nephro to consult, BUN/Creat ok, urine 2/3 without proteinuria-  2/7- K+ up to 2.7- will give another KCl 40 mEq x3 and also add Mg level in AM, and Mg Oxide 400 mg BID since likely low, since K+ won't improve; labs in AM Hyponatremia- As per nephro, now receiving lactated ringers   2/6- Na up to 131- doing better- con't NS IVFs  2/7- Na back down to 130- will con't IVFs and  recheck in AM BMP Latest Ref Rng & Units 06/24/2019 06/22/2019 06/21/2019  Glucose 70 - 99 mg/dL 97 103(H) 122(H)  BUN 8 - 23 mg/dL 22 13 9   Creatinine 0.44 - 1.00 mg/dL 0.62 0.44 0.55  Sodium 135 - 145 mmol/L 133(L) 133(L) 133(L)  Potassium 3.5 - 5.1 mmol/L 3.8 3.8 3.1(L)  Chloride 98 - 111 mmol/L 99 104 101  CO2 22 - 32 mmol/L 22 18(L) 20(L)  Calcium 8.9 - 10.3 mg/dL 9.4 8.9 9.0    12. Vascular HA improved on topiramate 13. Nausea/+/- vomiting- improved intake better 14 GERD will change PPI to H2 blocker to help with Mg+++ absorption  14. Dysphagia due to CVA   LOS: 10 days A FACE TO FACE EVALUATION WAS PERFORMED  Charlett Blake 06/24/2019, 8:34 AM

## 2019-06-25 ENCOUNTER — Inpatient Hospital Stay (HOSPITAL_COMMUNITY): Payer: Medicare PPO | Admitting: Speech Pathology

## 2019-06-25 ENCOUNTER — Inpatient Hospital Stay (HOSPITAL_COMMUNITY): Payer: Medicare PPO

## 2019-06-25 ENCOUNTER — Inpatient Hospital Stay (HOSPITAL_COMMUNITY): Payer: Medicare PPO | Admitting: Occupational Therapy

## 2019-06-25 NOTE — Progress Notes (Signed)
Physical Therapy Session Note  Patient Details  Name: Tanya Knight MRN: [id number] Date of Birth: Oct 28, 1940  Today's Date: 06/25/2019 PT Individual Time: 1120-1205 PT Individual Time Calculation (min): 45 min   Short Term Goals: Week 1:  PT Short Term Goal 1 (Week 1): Pt will participate in static standing balance with modA PT Short Term Goal 1 - Progress (Week 1): Met PT Short Term Goal 2 (Week 1): Pt will demonstrate improved attention to task requiring stimulation <50% during session PT Short Term Goal 2 - Progress (Week 1): Met PT Short Term Goal 3 (Week 1): Pt will perform sit<>stand transfer with minA PT Short Term Goal 3 - Progress (Week 1): Progressing toward goal PT Short Term Goal 4 (Week 1): Pt will perform dynamic sitting balance with minA PT Short Term Goal 4 - Progress (Week 1): Met PT Short Term Goal 5 (Week 1): Pt will initiate gait training PT Short Term Goal 5 - Progress (Week 1): Met Week 2:  PT Short Term Goal 1 (Week 2): Pt will ambulate x 20 ft with LRAD and mod assist PT Short Term Goal 2 (Week 2): Pt will perform bed<>chair transfer with min assist PT Short Term Goal 3 (Week 2): Pt will tolertate OOB activity x 45 minutes while fully participating in therapy session PT Short Term Goal 4 (Week 2): Pt will maintain static sitting balance with CGA Week 3:     Skilled Therapeutic Interventions/Progress Updates:    PAIN  Denies pain this am  Pt initially sleeping supine but fairly easy to arouse.  Agreeable to sitting up and attempting to get OOB to wc w/PT.  Pt supine to side w/mod assist, sit to sit w/mod to max assist and sequential verbal cueing.   Initially leaning heavily post.  Worked on wt shifting w/hands propped on knees pt sits several min w/cga.  Worked on reaching forwards w/return to upright x 8 reps and min assist, tends toovershoot and return to post lean.  With fatigue also began to lean to R, worked on reaching across body to L and return to  midline, also overshoots to L w/return.    STS from bed w/mod assist of 1, leans posteriorly, worked on wt shifting in standing w/pt using hands on therapists  Waist to promote ant wt shift. Bed to wc stand pivot transfer w/mod assist and verbal cues for safety/seqencing.  Pt repositioned in wc w/cues and mod assist.  Transported to rehab gym in [place] wc.  STS to swedish walker set at lower setting to facilitate ant wt shift.  Gait 65f x 2 w/mod assist of 1 to control walker/facilitate upright posture, shuffling gait, verbal and tactile cues for trunk and hip extension/lower walker may have overcorrected post tendency w/relutant increased hip/trunk flexion w/gait.  Pt max assist for turning and backing to sitting surfaces (mat, wc) and requires min assist w/resting sitting balance due to fatigue.  Once pt seated in wc w/feet on leg rests, she was able to reposition via boosting using exts x 4 w/step by step cuing for task only.  Improved tolerance for activity today w/progression to gait w/swedish walker.   Pt transported to room at end of session. Pt left oob in wc, chair tilted for safety/comfort w/alarm belt set and needs in reach   Therapy Documentation Precautions:  Precautions Precautions: Fall Precaution Comments: diplopia Restrictions Weight Bearing Restrictions: No    Therapy/Group: Individual Therapy  BCallie Fielding PAdmire2/13/2021, 12:12  PM  

## 2019-06-25 NOTE — Progress Notes (Signed)
Occupational Therapy Session Note  Patient Details  Name: Tanya Knight MRN: [id number] Date of Birth: 1940/06/04  Today's Date: 06/25/2019 OT Individual Time: [id number] Individual Time Calculation (min): 45 min    Short Term Goals: Week 2:  OT Short Term Goal 1 (Week 2): Pt will maintain sustained attention for 30 mins with no more than min instructional cueing during ADL tasks. OT Short Term Goal 2 (Week 2): Pt will complete UB selfcare in sitting with supervision. OT Short Term Goal 3 (Week 2): Pt will complete LB dressing sit to stand with mod assist. OT Short Term Goal 4 (Week 2): Pt will complete toilet transfers stand pivot with the RW with min assist.  Skilled Therapeutic Interventions/Progress Updates:    Treatment session with focus on arousal, sitting balance, and sequencing during self-care tasks.  Pt received supine in bed, awaking upon arrival and agreeable to therapy session.  Engaged in LB bathing at bed level with rolling and encouraging pt to participate in aspects of bathing.  Completed bed mobility with mod assist to EOB.  Engaged in bathing seated EOB with focus on sitting balance.  Pt with extreme posterior lean in sitting, incorporated reaching for items on table to facilitate increased upright posture and weight shifting out of posterior lean.  Pt still requiring mod assist for sitting balance.  Mod assist for UB bathing and dressing, facilitating upright posture.  Completed LB dressing with pt bending forward to pull pants over knee and then sit > stand with mod assist.  Pt able to pull pants over hips while therapist facilitated upright standing.  Pt increased fatigue post standing, therefore returned to bed and left semi-reclined with all needs in reach.  Therapy Documentation Precautions:  Precautions Precautions: Fall Precaution Comments: diplopia Restrictions Weight Bearing Restrictions: No General:   Vital Signs: Therapy Vitals Pulse Rate: 92 BP:  111/85 Pain: Pain Assessment Pain Scale: 0-10 Pain Score: 0-No pain   Therapy/Group: Individual Therapy  Simonne Come 06/25/2019, 12:22 PM

## 2019-06-25 NOTE — Progress Notes (Signed)
Speech Language Pathology Daily Session Note  Patient Details  Name: Tanya Knight MRN: [id number] Date of Birth: [birth year]  Today's Date: 06/25/2019 SLP Individual Time: 1300-1330 SLP Individual Time Calculation (min): 30 min  Short Term Goals: Week 2: SLP Short Term Goal 1 (Week 2): Pt will consume current diet with minimal overt s/sx aspiration and demonstrate efficient mastication and oral clearance with Min A verbal/visual cues. SLP Short Term Goal 2 (Week 2): Pt will provide intelligible word and phrase level verbal responses to questions in 75% opportunities with Mod A cues for use of increased rate of speech and vocal intensity. SLP Short Term Goal 3 (Week 2): Pt will initiate during functional familiar tasks in 50% opportunities with Mod A multimodal cues. SLP Short Term Goal 4 (Week 2): Pt will demonstrate emergent awareness evidenced by her ability to detect verbal and/or functional errors during familiar tasks with Mod A visual/verbal cues. SLP Short Term Goal 5 (Week 2): Pt will demonstrate basic problem solving during functional and familiar tasks with Mod A verbal/visual cues.  Skilled Therapeutic Interventions:  Pt was seen for skilled ST targeting cognitive goals.  Upon arrival, pt was finishing her lunch with nurse techs providing full supervision.  Pt indicated that she was full and didn't want to eat any more.  Pt indicated that she wanted to brush her teeth after meal and was able to complete oral care with set up assist and min verbal cues for task sequencing and organization.  Pt reported 9/10 back pain but did not otherwise appear in distress.  Kpad applied and tilt in space wheelchair reclined to provide relief.  Pt was left in wheelchair with chair alarm set and call bell within reach. Continue per current plan of care.    Pain Pain Assessment Pain Scale: 0-10 Pain Score: 9  Pain Location: Back Pain Intervention(s): Repositioned;RN made aware  Therapy/Group:  Individual Therapy  Rodderick Holtzer, Selinda Orion 06/25/2019, 1:32 PM

## 2019-06-25 NOTE — Progress Notes (Signed)
[facility] PHYSICAL MEDICINE & REHABILITATION PROGRESS NOTE   Subjective/Complaints:  Appreciate renal note.Nephro has signed off after correction of elyte abnormalities Oriented to person , Month and situation  ROS- no CP, SOB, no abd pain Denies heartburn   Objective:   No results found. No results for input(s): WBC, HGB, HCT, PLT in the last 72 hours. Recent Labs    06/24/19 0556  NA 133*  K 3.8  CL 99  CO2 22  GLUCOSE 97  BUN 22  CREATININE 0.62  CALCIUM 9.4    Intake/Output Summary (Last 24 hours) at 06/25/2019 0826 Last data filed at 06/25/2019 [id number] Gross per 24 hour  Intake 300 ml  Output --  Net 300 ml     Physical Exam: Vital Signs Blood pressure 124/67, pulse 76, temperature 98.8 F (37.1 C), temperature source Oral, resp. rate 18, weight 55.8 kg, SpO2 99 %. General: No acute distress,  A little less lethargic, eyes open entire conversation; in bed  Mood and affect are appropriate Heart: Regular rate and rhythm no rubs murmurs or extra sounds Lungs: Clear to auscultation, breathing unlabored, no rales or wheezes Abdomen: Positive bowel sounds, soft nontender to palpation, nondistended Extremities: No clubbing, cyanosis, or edema Skin: No evidence of breakdown, no evidence of rash Neurologic: oriented to person place time (month not day) and situation  motor strength is 4/5 in bilateral deltoid, bicep, tricep, grip, hip flexor, knee extensors, ankle dorsiflexor and plantar flexor Sensory exam normal sensation to light touch and proprioception in bilateral upper and lower extremities Cerebellar exam moderate dysmetria on L and MIld on RIght l finger to nose to finger as well as heel to shin in bilateral upper and lower extremities Musculoskeletal: Full range of motion in all 4 extremities. No joint swelling   Assessment/Plan: 1. Functional deficits secondary to Cerebellar vermis ICH which require 3+ hours per day of interdisciplinary therapy in a  comprehensive inpatient rehab setting.  Physiatrist is providing close team supervision and 24 hour management of active medical problems listed below.  Physiatrist and rehab team continue to assess barriers to discharge/monitor patient progress toward functional and medical goals  Care Tool:  Bathing    Body parts bathed by patient: Right arm, Left arm, Right upper leg, Left upper leg, Right lower leg, Left lower leg, Face, Chest, Abdomen   Body parts bathed by helper: Front perineal area, Buttocks Body parts n/a: Abdomen, Chest(did not attempt this session)   Bathing assist Assist Level: Moderate Assistance - Patient 50 - 74%     Upper Body Dressing/Undressing Upper body dressing   What is the patient wearing?: Pull over shirt    Upper body assist Assist Level: Moderate Assistance - Patient 50 - 74%    Lower Body Dressing/Undressing Lower body dressing      What is the patient wearing?: Pants, Incontinence brief     Lower body assist Assist for lower body dressing: Maximal Assistance - Patient 25 - 49%     Toileting Toileting    Toileting assist Assist for toileting: Dependent - Patient 0%     Transfers Chair/bed transfer  Transfers assist  Chair/bed transfer activity did not occur: Safety/medical concerns  Chair/bed transfer assist level: Maximal Assistance - Patient 25 - 49%     Locomotion Ambulation   Ambulation assist   Ambulation activity did not occur: Safety/medical concerns  Assist level: 2 helpers(mod +2 HHA) Assistive device: Hand held assist Max distance: 10 ft   Walk 10 feet activity  Assist  Walk 10 feet activity did not occur: Safety/medical concerns  Assist level: 2 helpers Assistive device: Hand held assist   Walk 50 feet activity   Assist Walk 50 feet with 2 turns activity did not occur: Safety/medical concerns         Walk 150 feet activity   Assist Walk 150 feet activity did not occur: Safety/medical concerns          Walk 10 feet on uneven surface  activity   Assist Walk 10 feet on uneven surfaces activity did not occur: Safety/medical concerns         Wheelchair     Assist Will patient use wheelchair at discharge?: No(Not anticipated; no LT goals set)   Wheelchair activity did not occur: Safety/medical concerns         Wheelchair 50 feet with 2 turns activity    Assist    Wheelchair 50 feet with 2 turns activity did not occur: Safety/medical concerns       Wheelchair 150 feet activity     Assist  Wheelchair 150 feet activity did not occur: Safety/medical concerns       Blood pressure 124/67, pulse 76, temperature 98.8 F (37.1 C), temperature source Oral, resp. rate 18, weight 55.8 kg, SpO2 99 %.    1.  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  Slow progress with therapy   Cont PT,  OT, SLP 15/7             -patient may  shower          2.  Antithrombotics: -DVT/anticoagulation: Subcutaneous heparin initiated 06/11/2019             -antiplatelet therapy: N/A 3. Pain Management: Continue Tramadol as needed, Fioricet as needed for headaches 4. Mood: Provide emotional support             -antipsychotic agents: N/A 5. Neuropsych: This patient is NOT capable of making decisions on her own behalf. 6. Skin/Wound Care: Continue routine skin checks 7. Fluids/Electrolytes/Nutrition: Routine in and outs with follow-up chemistries 8.  Hypertension.  Norvasc 10 mg daily, Toprol-XL 50 mg daily.  Monitor with increased mobility- monitor closely.    Vitals:   06/24/19 1939 06/25/19 0432  BP: 119/74 124/67  Pulse: 72 76  Resp: 16 18  Temp: 98.5 F (36.9 C) 98.8 F (37.1 C)  SpO2: 99% 99%  controlled 2/13 9.  History of seizure disorder.  Continue Depakote 500 mg every 12 hours For old seizure d/o as well as prevention of  new seizures 10. Diplopia- suggest eye patch intermittently to help with double vision 11. Hypokalemia- last K+ 2.6- will supplement with K CL IV riders  will monitor, also with mild hyponatremia , not on diuretics   2/6- gave KCl 40 mEq x3 in 24 hours and also 10 mEq hourly by IVFs  E lyte abnormaility, probable SIADH , low K+, Mg+++ and Na+ improved Nephro signed off  2/7- K+ up to 2.7- will give another KCl 40 mEq x3 and also add Mg level in AM, and Mg Oxide 400 mg BID since likely low, since K+ won't improve; labs in AM Hyponatremia- As per nephro, now receiving lactated ringers   2/6- Na up to 131- doing better- con't NS IVFs  2/7- Na back down to 130- will con't IVFs and recheck in AM BMP Latest Ref Rng &  Units 06/24/2019 06/22/2019 06/21/2019  Glucose 70 - 99 mg/dL 97 103(H) 122(H)  BUN 8 - 23 mg/dL 22 13 9   Creatinine 0.44 - 1.00 mg/dL 0.62 0.44 0.55  Sodium 135 - 145 mmol/L 133(L) 133(L) 133(L)  Potassium 3.5 - 5.1 mmol/L 3.8 3.8 3.1(L)  Chloride 98 - 111 mmol/L 99 104 101  CO2 22 - 32 mmol/L 22 18(L) 20(L)  Calcium 8.9 - 10.3 mg/dL 9.4 8.9 9.0    12. Vascular HA improved on topiramate 13. Nausea/+/- vomiting- improved intake better 14 GERD will change PPI to H2 blocker to help with Mg+++ absorption  14. Dysphagia due to CVA   LOS: 11 days A FACE TO FACE EVALUATION WAS PERFORMED  Charlett Blake 06/25/2019, 8:26 AM

## 2019-06-26 ENCOUNTER — Inpatient Hospital Stay (HOSPITAL_COMMUNITY): Payer: Medicare PPO | Admitting: Occupational Therapy

## 2019-06-26 ENCOUNTER — Inpatient Hospital Stay (HOSPITAL_COMMUNITY): Payer: Medicare PPO

## 2019-06-26 NOTE — Progress Notes (Signed)
Physical Therapy Session Note  Patient Details  Name: Tanya Knight MRN: [id number] Date of Birth: [birth year]  Today's Date: 06/26/2019 PT Individual Time: 1031-1130 PT Individual Time Calculation (min): 59 min    Short Term Goals: Week 2:  PT Short Term Goal 1 (Week 2): Pt will ambulate x 20 ft with LRAD and mod assist PT Short Term Goal 2 (Week 2): Pt will perform bed<>chair transfer with min assist PT Short Term Goal 3 (Week 2): Pt will tolertate OOB activity x 45 minutes while fully participating in therapy session PT Short Term Goal 4 (Week 2): Pt will maintain static sitting balance with CGA  Skilled Therapeutic Interventions/Progress Updates:    Patient supine in bed upon PT arrival, agreeable to therapy tx, states 9/10 HA. NT present to assess pt for toileting, pt urine incontinent requiring brief and bed change, therapist stated able to help NT. Rolling R<>L for pericare, to change brief and bedding, pt required minA and additional time to initiate movement, cues for sequencing and positioning. Supine>sitting EOB minA for LE management and coming to upright posture. Initially, pt required modA to steady in sitting and requiring cueing and assist for UE and LE positioning for stability. Therapist donned pt's eye patch on L eye prior to engaging in exercises. Sit> stand pivot transfer to TIS w/c with modA + HHA for upright posture, positioning, sequencing, and to facilitate weight shifting. Pt transported to rehab gym via TIS w/c. Stand pivot transfer modA to mat table. Sitting EOM, pt performed dynamic reaching for objects at various heights and distances to facilitate anterior trunk lean and lateral weight shifting. Sit>standing at Ethelene Hal with modA to dec forward flexed posturing, pt ambulated 5' with modA, cues for weight shifting, inc step length, upright posture, and forward gaze. 2nd therapist brought w/c behind pt > modA to sitting and reposition in w/c however pt able to  demonstrate good posterior scooting.  In standing, pt demonstrates significantly extended posture initially needing cues to lean forward however during gait remains in significant forward trunk lean and shuffles. At end of session, pt appeared significantly fatigued as she began shutting her eyes. Transported pt back to room in [place]. Therapist asked pt if she is sensitive to the light and if the eye patch helps to which she agreed with both. Therapist closed blinds and left eye patch on pt, she repeated "thank you, that's wonderful" multiple times. Pt left in w/c with needs in reach and chair alarm set.   Therapy Documentation Precautions:  Precautions Precautions: Fall Precaution Comments: diplopia Restrictions Weight Bearing Restrictions: No    Therapy/Group: Individual Therapy  [name] SPT 06/26/2019, 7:45 AM

## 2019-06-26 NOTE — Progress Notes (Signed)
[facility] PHYSICAL MEDICINE & REHABILITATION PROGRESS NOTE   Subjective/Complaints:  ROS- no CP, SOB, no abd pain Denies heartburn   Objective:   No results found. No results for input(s): WBC, HGB, HCT, PLT in the last 72 hours. Recent Labs    06/24/19 0556  NA 133*  K 3.8  CL 99  CO2 22  GLUCOSE 97  BUN 22  CREATININE 0.62  CALCIUM 9.4    Intake/Output Summary (Last 24 hours) at 06/26/2019 0728 Last data filed at 06/25/2019 1807 Gross per 24 hour  Intake 460 ml  Output --  Net 460 ml     Physical Exam: Vital Signs Blood pressure 122/77, pulse 71, temperature 97.9 F (36.6 C), resp. rate 18, weight 55.8 kg, SpO2 99 %. General: No acute distress,  A little less lethargic, eyes open entire conversation; in bed  Mood and affect are appropriate Heart: Regular rate and rhythm no rubs murmurs or extra sounds Lungs: Clear to auscultation, breathing unlabored, no rales or wheezes Abdomen: Positive bowel sounds, soft nontender to palpation, nondistended Extremities: No clubbing, cyanosis, or edema Skin: No evidence of breakdown, no evidence of rash Neurologic: oriented to person place time (month not day) and situation  motor strength is 4/5 in bilateral deltoid, bicep, tricep, grip, hip flexor, knee extensors, ankle dorsiflexor and plantar flexor Sensory exam normal sensation to light touch and proprioception in bilateral upper and lower extremities Cerebellar exam moderate dysmetria on L and MIld on RIght l finger to nose to finger as well as heel to shin in bilateral upper and lower extremities Musculoskeletal: Full range of motion in all 4 extremities. No joint swelling   Assessment/Plan: 1. Functional deficits secondary to Cerebellar vermis ICH which require 3+ hours per day of interdisciplinary therapy in a comprehensive inpatient rehab setting.  Physiatrist is providing close team supervision and 24 hour management of active medical problems listed  below.  Physiatrist and rehab team continue to assess barriers to discharge/monitor patient progress toward functional and medical goals  Care Tool:  Bathing    Body parts bathed by patient: Right arm, Left arm, Chest, Abdomen, Front perineal area, Right upper leg, Left upper leg, Face   Body parts bathed by helper: Buttocks Body parts n/a: Abdomen, Chest(did not attempt this session)   Bathing assist Assist Level: Moderate Assistance - Patient 50 - 74%     Upper Body Dressing/Undressing Upper body dressing   What is the patient wearing?: Pull over shirt    Upper body assist Assist Level: Moderate Assistance - Patient 50 - 74%    Lower Body Dressing/Undressing Lower body dressing      What is the patient wearing?: Pants, Incontinence brief     Lower body assist Assist for lower body dressing: Maximal Assistance - Patient 25 - 49%     Toileting Toileting    Toileting assist Assist for toileting: Dependent - Patient 0%     Transfers Chair/bed transfer  Transfers assist  Chair/bed transfer activity did not occur: Safety/medical concerns  Chair/bed transfer assist level: Moderate Assistance - Patient 50 - 74%     Locomotion Ambulation   Ambulation assist   Ambulation activity did not occur: Safety/medical concerns  Assist level: Moderate Assistance - Patient 50 - 74% Assistive device: Walker-platform Max distance: 20   Walk 10 feet activity   Assist  Walk 10 feet activity did not occur: Safety/medical concerns  Assist level: Moderate Assistance - Patient - 50 - 74% Assistive device: Walker-platform  Walk 50 feet activity   Assist Walk 50 feet with 2 turns activity did not occur: Safety/medical concerns         Walk 150 feet activity   Assist Walk 150 feet activity did not occur: Safety/medical concerns         Walk 10 feet on uneven surface  activity   Assist Walk 10 feet on uneven surfaces activity did not occur: Safety/medical  concerns         Wheelchair     Assist Will patient use wheelchair at discharge?: No(Not anticipated; no LT goals set)   Wheelchair activity did not occur: Safety/medical concerns         Wheelchair 50 feet with 2 turns activity    Assist    Wheelchair 50 feet with 2 turns activity did not occur: Safety/medical concerns       Wheelchair 150 feet activity     Assist  Wheelchair 150 feet activity did not occur: Safety/medical concerns       Blood pressure 122/77, pulse 71, temperature 97.9 F (36.6 C), resp. rate 18, weight 55.8 kg, SpO2 99 %.    1.  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  Slow progress with therapy   Cont PT,  OT, SLP 15/7             -patient may  shower          2.  Antithrombotics: -DVT/anticoagulation: Subcutaneous heparin initiated 06/11/2019             -antiplatelet therapy: N/A 3. Pain Management: Continue Tramadol as needed, Fioricet as needed for headaches 4. Mood: Provide emotional support             -antipsychotic agents: N/A 5. Neuropsych: This patient is NOT capable of making decisions on her own behalf. 6. Skin/Wound Care: Continue routine skin checks 7. Fluids/Electrolytes/Nutrition: Routine in and outs with follow-up chemistries 8.  Hypertension.  Norvasc 10 mg daily, Toprol-XL 50 mg daily.  Monitor with increased mobility- monitor closely.    Vitals:   06/25/19 1922 06/26/19 0653  BP: 124/72 122/77  Pulse: 68 71  Resp: 18 18  Temp: 97.8 F (36.6 C) 97.9 F (36.6 C)  SpO2: 98% 99%  controlled 2/14 9.  History of seizure disorder.  Continue Depakote 500 mg every 12 hours For old seizure d/o as well as prevention of new seizures 10. Diplopia- suggest eye patch intermittently to help with double vision 11. Hypokalemia- last K+ 2.6- will supplement with K CL IV riders   will monitor, also with mild hyponatremia , not on diuretics   2/6- gave KCl 40 mEq x3 in 24 hours and also 10 mEq hourly by IVFs  E lyte abnormaility, probable SIADH , low K+, Mg+++ and Na+ improved Nephro signed off  CMET in am  BMP Latest Ref Rng & Units 06/24/2019 06/22/2019 06/21/2019  Glucose 70 - 99 mg/dL 97 103(H) 122(H)  BUN 8 - 23 mg/dL 22 13 9   Creatinine 0.44 - 1.00 mg/dL 0.62 0.44 0.55  Sodium 135 - 145 mmol/L 133(L) 133(L) 133(L)  Potassium 3.5 - 5.1 mmol/L 3.8 3.8 3.1(L)  Chloride 98 - 111 mmol/L 99 104 101  CO2 22 - 32 mmol/L 22 18(L) 20(L)  Calcium 8.9 - 10.3 mg/dL 9.4 8.9 9.0    12. Vascular HA improved on topiramate 25mg  BID  13. Nausea/+/- vomiting-  improved intake better meal intake 25-80% 14 GERD will change PPI to H2 blocker to help with Mg+++ absorption  14. Dysphagia due to CVA   LOS: 12 days A FACE TO FACE EVALUATION WAS PERFORMED  Charlett Blake 06/26/2019, 7:28 AM

## 2019-06-26 NOTE — Progress Notes (Signed)
Occupational Therapy Session Note  Patient Details  Name: Tanya Knight MRN: [id number] Date of Birth: May 21, 1940  Today's Date: 06/26/2019 OT Individual Time: [id number] OT Individual Time Calculation (min): 59 min   Short Term Goals: Week 2:  OT Short Term Goal 1 (Week 2): Pt will maintain sustained attention for 30 mins with no more than min instructional cueing during ADL tasks. OT Short Term Goal 2 (Week 2): Pt will complete UB selfcare in sitting with supervision. OT Short Term Goal 3 (Week 2): Pt will complete LB dressing sit to stand with mod assist. OT Short Term Goal 4 (Week 2): Pt will complete toilet transfers stand pivot with the RW with min assist.  Skilled Therapeutic Interventions/Progress Updates:    Pt greeted in the TIS, alert, and agreeable to attempt to use the toilet. Mod A for stand pivot<elevated toilet using the grab bar. After OT completed clothing mgt, pt with large continent BM and also voided bladder. Pt attempted to complete perihygiene in the front herself however needed assistance. She stood with Mod A for balance and manual facilitation for anterior weight shifting during hygiene and clothing mgt afterwards. Once she returned to the w/c, pt engaged in UB bathing and dressing tasks with TIS placed in neutral. Pt able to complete UB self care with supervision assist. Worked on anterior weight shifting when reaching for grooming items on the sink with Mod A and min facilitation for the Rt UE. At end of session pt was reclined in TIS with safety belt fastened, in company of her son. Tx focus placed on trunk control, functional transfers, standing endurance, balance, and activity tolerance.    Pt wore the eye patch during session and lights were dimmed as often as possible to address vestibular issues. She reported dizziness was 10/10 in severity but asymptomatic during session     Therapy Documentation Precautions:  Precautions Precautions: Fall Precaution  Comments: diplopia Restrictions Weight Bearing Restrictions: No Vital Signs: Therapy Vitals Temp: 98.7 F (37.1 C) Temp Source: Oral Pulse Rate: 74 Resp: 18 BP: 125/76 Patient Position (if appropriate): Sitting Oxygen Therapy SpO2: 98 % O2 Device: Room Air Pain: No c/o pain during tx. When offered to be set up with k-pad for her back, she declined (son said she's been having some back pain)   ADL: ADL Grooming: Moderate assistance(secondary to lethargy) Where Assessed-Grooming: Edge of bed Upper Body Bathing: Moderate assistance Where Assessed-Upper Body Bathing: Edge of bed Lower Body Bathing: Dependent Where Assessed-Lower Body Bathing: Edge of bed Upper Body Dressing: Dependent Where Assessed-Upper Body Dressing: Edge of bed Lower Body Dressing: Dependent Where Assessed-Lower Body Dressing: Edge of bed Toileting: Dependent Where Assessed-Toileting: Bedside Commode :     Therapy/Group: Individual Therapy  Fenix Rorke A Christina Gintz 06/26/2019, 3:51 PM

## 2019-06-27 ENCOUNTER — Inpatient Hospital Stay (HOSPITAL_COMMUNITY): Payer: Medicare PPO

## 2019-06-27 ENCOUNTER — Inpatient Hospital Stay (HOSPITAL_COMMUNITY): Payer: Medicare PPO | Admitting: Speech Pathology

## 2019-06-27 ENCOUNTER — Inpatient Hospital Stay (HOSPITAL_COMMUNITY): Payer: Medicare PPO | Admitting: Occupational Therapy

## 2019-06-27 LAB — COMPREHENSIVE METABOLIC PANEL
ALT: 18 U/L (ref 0–44)
AST: 18 U/L (ref 15–41)
Albumin: 2.9 g/dL — ABNORMAL LOW (ref 3.5–5.0)
Alkaline Phosphatase: 45 U/L (ref 38–126)
Anion gap: 11 (ref 5–15)
BUN: 31 mg/dL — ABNORMAL HIGH (ref 8–23)
CO2: 20 mmol/L — ABNORMAL LOW (ref 22–32)
Calcium: 9.1 mg/dL (ref 8.9–10.3)
Chloride: 107 mmol/L (ref 98–111)
Creatinine, Ser: 0.66 mg/dL (ref 0.44–1.00)
GFR calc Af Amer: >60 mL/min (ref >60)
GFR calc non Af Amer: >60 mL/min (ref >60)
Glucose, Bld: 105 mg/dL — ABNORMAL HIGH (ref 70–99)
Potassium: 3.9 mmol/L (ref 3.5–5.1)
Sodium: 138 mmol/L (ref 135–145)
Total Bilirubin: 0.7 mg/dL (ref 0.3–1.2)
Total Protein: 5.6 g/dL — ABNORMAL LOW (ref 6.5–8.1)

## 2019-06-27 LAB — CBC WITH DIFFERENTIAL/PLATELET
Abs Immature Granulocytes: 0.13 10*3/uL — ABNORMAL HIGH (ref 0.00–0.07)
Basophils Absolute: 0.1 10*3/uL (ref 0.0–0.1)
Basophils Relative: 1 %
Eosinophils Absolute: 0.2 10*3/uL (ref 0.0–0.5)
Eosinophils Relative: 2 %
HCT: 38.1 % (ref 36.0–46.0)
Hemoglobin: 13.1 g/dL (ref 12.0–15.0)
Immature Granulocytes: 1 %
Lymphocytes Relative: 14 %
Lymphs Abs: 1.5 10*3/uL (ref 0.7–4.0)
MCH: 30.9 pg (ref 26.0–34.0)
MCHC: 34.4 g/dL (ref 30.0–36.0)
MCV: 89.9 fL (ref 80.0–100.0)
Monocytes Absolute: 1.3 10*3/uL — ABNORMAL HIGH (ref 0.1–1.0)
Monocytes Relative: 12 %
Neutro Abs: 7.3 10*3/uL (ref 1.7–7.7)
Neutrophils Relative %: 70 %
Platelets: 328 10*3/uL (ref 150–400)
RBC: 4.24 MIL/uL (ref 3.87–5.11)
RDW: 14.7 % (ref 11.5–15.5)
WBC: 10.5 10*3/uL (ref 4.0–10.5)
nRBC: 0 % (ref 0.0–0.2)

## 2019-06-27 MED ORDER — TOPIRAMATE 25 MG PO TABS
50.0000 mg | ORAL_TABLET | Freq: Two times a day (BID) | ORAL | Status: DC
  Administered 2019-06-27: 21:00:00 50 mg via ORAL
  Filled 2019-06-27 (×2): qty 2

## 2019-06-27 NOTE — Progress Notes (Signed)
[facility] PHYSICAL MEDICINE & REHABILITATION PROGRESS NOTE   Subjective/Complaints: Complains of some headache and fatigue this morning. Denies other sources of pain, constipation. Slept well last night.  Labs stable this morning.  ROS- no CP, SOB, no abd pain Denies heartburn   Objective:   No results found. Recent Labs    06/27/19 0723  WBC 10.5  HGB 13.1  HCT 38.1  PLT 328   Recent Labs    06/27/19 0723  NA 138  K 3.9  CL 107  CO2 20*  GLUCOSE 105*  BUN 31*  CREATININE 0.66  CALCIUM 9.1    Intake/Output Summary (Last 24 hours) at 06/27/2019 1006 Last data filed at 06/27/2019 0600 Gross per 24 hour  Intake 480 ml  Output --  Net 480 ml     Physical Exam: Vital Signs Blood pressure 138/83, pulse 78, temperature 97.9 F (36.6 C), temperature source Oral, resp. rate 16, weight 55.8 kg, SpO2 99 %. General: No acute distress,  A little less lethargic, eyes open entire conversation; in bed, fatigued.  Mood and affect are appropriate Heart: Regular rate and rhythm no rubs murmurs or extra sounds Lungs: Clear to auscultation, breathing unlabored, no rales or wheezes Abdomen: Positive bowel sounds, soft nontender to palpation, nondistended Extremities: No clubbing, cyanosis, or edema Skin: No evidence of breakdown, no evidence of rash Neurologic: oriented to person place time (month not day) and situation  motor strength is 4/5 in bilateral deltoid, bicep, tricep, grip, hip flexor, knee extensors, ankle dorsiflexor and plantar flexor Sensory exam normal sensation to light touch and proprioception in bilateral upper and lower extremities Cerebellar exam moderate dysmetria on L and MIld on RIght l finger to nose to finger as well as heel to shin in bilateral upper and lower extremities Musculoskeletal: Full range of motion in all 4 extremities. No joint swelling   Assessment/Plan: 1. Functional deficits secondary to Cerebellar vermis ICH which require 3+ hours per  day of interdisciplinary therapy in a comprehensive inpatient rehab setting.  Physiatrist is providing close team supervision and 24 hour management of active medical problems listed below.  Physiatrist and rehab team continue to assess barriers to discharge/monitor patient progress toward functional and medical goals  Care Tool:  Bathing     Body parts bathed by patient: Right arm, Left arm, Chest, Abdomen, Front perineal area, Right upper leg, Left upper leg, Face   Body parts bathed by helper: Buttocks Body parts n/a: Abdomen, Chest(did not attempt this session)   Bathing assist Assist Level: Moderate Assistance - Patient 50 - 74%     Upper Body Dressing/Undressing Upper body dressing   What is the patient wearing?: Pull over shirt    Upper body assist Assist Level: Supervision/Verbal cueing    Lower Body Dressing/Undressing Lower body dressing      What is the patient wearing?: Pants, Incontinence brief     Lower body assist Assist for lower body dressing: Maximal Assistance - Patient 25 - 49%     Toileting Toileting    Toileting assist Assist for toileting: Maximal Assistance - Patient 25 - 49%     Transfers Chair/bed transfer  Transfers assist  Chair/bed transfer activity did not occur: Safety/medical concerns  Chair/bed transfer assist level: Moderate Assistance - Patient 50 - 74%     Locomotion Ambulation   Ambulation assist   Ambulation activity did not occur: Safety/medical concerns  Assist level: Moderate Assistance - Patient 50 - 74% Assistive device: Ethelene Hal Max distance: 5'  Walk 10 feet activity   Assist  Walk 10 feet activity did not occur: Safety/medical concerns  Assist level: Moderate Assistance - Patient - 50 - 74% Assistive device: Walker-platform   Walk 50 feet activity   Assist Walk 50 feet with 2 turns activity did not occur: Safety/medical concerns         Walk 150 feet activity   Assist Walk 150 feet  activity did not occur: Safety/medical concerns         Walk 10 feet on uneven surface  activity   Assist Walk 10 feet on uneven surfaces activity did not occur: Safety/medical concerns         Wheelchair     Assist Will patient use wheelchair at discharge?: No(Not anticipated; no LT goals set)   Wheelchair activity did not occur: Safety/medical concerns         Wheelchair 50 feet with 2 turns activity    Assist    Wheelchair 50 feet with 2 turns activity did not occur: Safety/medical concerns       Wheelchair 150 feet activity     Assist  Wheelchair 150 feet activity did not occur: Safety/medical concerns       Blood pressure 138/83, pulse 78, temperature 97.9 F (36.6 C), temperature source Oral, resp. rate 16, weight 55.8 kg, SpO2 99 %.    1.  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  Slow progress with therapy     Continue PT, OT, SLP 15/7               -patient may  shower 2.  Antithrombotics: -DVT/anticoagulation: Subcutaneous heparin initiated 06/11/2019             -antiplatelet therapy: N/A 3. Pain Management: Continue Tramadol as needed, Fioricet as needed for headaches 4. Mood: Provide emotional support             -antipsychotic agents: N/A 5. Neuropsych: This patient is NOT capable of making decisions on her own behalf. 6. Skin/Wound Care: Continue routine skin checks 7. Fluids/Electrolytes/Nutrition: Routine in and outs with follow-up chemistries 8.  Hypertension.  Norvasc 10 mg daily, Toprol-XL 50 mg daily.  Monitor with increased mobility- monitor closely.    Vitals:   06/26/19 1950 06/27/19 0441  BP: 120/78 138/83  Pulse: 81 78  Resp: 16 16  Temp: 97.7 F (36.5 C) 97.9 F (36.6 C)  SpO2: 98% 99%  controlled 2/14  2/15: well controlled.  9.  History of seizure disorder.  Continue  Depakote 500 mg every 12 hours For old seizure d/o as well as prevention of new seizures 10. Diplopia- suggest eye patch intermittently to help with double vision 11. Hypokalemia- last K+ 2.6- will supplement with K CL IV riders  will monitor, also with mild hyponatremia , not on diuretics   2/6- gave KCl 40 mEq x3 in 24 hours and also 10 mEq hourly by IVFs  E lyte abnormaility, probable SIADH , low K+, Mg+++ and Na+ improved Nephro signed off  CMET in am   2/15: K+ is stable at 3.8. Magnesium continues to trend upward; currently 1.9.    BMP Latest Ref Rng & Units 06/27/2019 06/24/2019 06/22/2019  Glucose 70 - 99 mg/dL 105(H) 97 103(H)  BUN 8 - 23 mg/dL 31(H) 22 13  Creatinine 0.44 - 1.00 mg/dL 0.66 0.62 0.44  Sodium 135 - 145 mmol/L 138 133(L)  133(L)  Potassium 3.5 - 5.1 mmol/L 3.9 3.8 3.8  Chloride 98 - 111 mmol/L 107 99 104  CO2 22 - 32 mmol/L 20(L) 22 18(L)  Calcium 8.9 - 10.3 mg/dL 9.1 9.4 8.9    12. Vascular HA improved on topiramate 25mg  BID   -Headache still an issue. Patient's hands are on her head this morning, appears to be in greater pain than she is stating. Will increase Topiramate to 50mg  BID.  13. Nausea/+/- vomiting- improved intake better meal intake 25-80% 14 GERD will change PPI to H2 blocker to help with Mg+++ absorption  14. Dysphagia due to CVA   LOS: 13 days A FACE TO FACE EVALUATION WAS PERFORMED  Zerah Hilyer P Namari Breton 06/27/2019, 10:06 AM

## 2019-06-27 NOTE — Plan of Care (Signed)
  Problem: Consults Goal: RH STROKE PATIENT EDUCATION Description: See Patient Education module for education specifics  Outcome: Progressing   Problem: RH BOWEL ELIMINATION Goal: RH STG MANAGE BOWEL WITH ASSISTANCE Description: STG Manage Bowel with min Assistance. Outcome: Progressing Goal: RH STG MANAGE BOWEL W/MEDICATION W/ASSISTANCE Description: STG Manage Bowel with Medication with min Assistance. Outcome: Progressing   Problem: RH BLADDER ELIMINATION Goal: RH STG MANAGE BLADDER WITH ASSISTANCE Description: STG Manage Bladder With min Assistance Outcome: Progressing   Problem: RH SKIN INTEGRITY Goal: RH STG SKIN FREE OF INFECTION/BREAKDOWN Description: Patients skin will remain free from further infection or breakdown with min assist. Outcome: Progressing Goal: RH STG MAINTAIN SKIN INTEGRITY WITH ASSISTANCE Description: STG Maintain Skin Integrity With min Assistance. Outcome: Progressing Goal: RH STG ABLE TO PERFORM INCISION/WOUND CARE W/ASSISTANCE Description: STG Able To Perform Incision/Wound Care With min/mod Assistance. Outcome: Progressing   Problem: RH SAFETY Goal: RH STG ADHERE TO SAFETY PRECAUTIONS W/ASSISTANCE/DEVICE Description: STG Adhere to Safety Precautions With min Assistance/Device. Outcome: Progressing   Problem: RH KNOWLEDGE DEFICIT Goal: RH STG INCREASE KNOWLEDGE OF HYPERTENSION Description: Patient/caregiver will verbalize understanding of management of HTN including diet, exercise, medications, monitoring, and follow up care with min assist. Outcome: Progressing Goal: RH STG INCREASE KNOWLEGDE OF HYPERLIPIDEMIA Description: Patient/caregiver will verbalize understanding of management of HLD including diet, exercise, medications, monitoring, and follow up care with min assist. Outcome: Progressing Goal: RH STG INCREASE KNOWLEDGE OF STROKE PROPHYLAXIS Description: Patient/caregiver will verbalize understanding of management of stroke prophylaxis  including diet, exercise, medications, monitoring, and follow up care with min assist. Outcome: Progressing

## 2019-06-27 NOTE — Progress Notes (Signed)
Physical Therapy Session Note  Patient Details  Name: Tanya Knight MRN: [id number] Date of Birth: 06/20/1940  Today's Date: 06/27/2019 PT Individual Time: [id number] PT Individual Time Calculation (min): 58 min    Short Term Goals: Week 2:  PT Short Term Goal 1 (Week 2): Pt will ambulate x 20 ft with LRAD and mod assist PT Short Term Goal 2 (Week 2): Pt will perform bed<>chair transfer with min assist PT Short Term Goal 3 (Week 2): Pt will tolertate OOB activity x 45 minutes while fully participating in therapy session PT Short Term Goal 4 (Week 2): Pt will maintain static sitting balance with CGA  Skilled Therapeutic Interventions/Progress Updates:    Patient received supine in bed upon PT arrival, [name] present, agreeable to therapy tx, reports HA is better today but rates 8/10. Pt states having the blinds closed has helped dec HA. Pt appeared significantly more alert upon PT arrival, with both eyes open wide and was participating in conversation with son while also changing facial expressions. Therapist donned TEDs and socks totalA, and donned pants maxA for LE threading. Pt performed bridging to assist donning pants requiring cues for technique. Supine>sit transfer with minA and cues for sequencing and positioning. Sitting EOB, pt demonstrated static sitting balance with CGA. Stand pivot transfer with mod-maxA for anterior trunk lean as pt demonstrates excessive posterior lean, cues for sequencing and technique. Transported to ortho gym in [place] w/c for energy conservation. Sit>stand to RW with minA > stand pivot transfer w/RW > mat with modA cues for sequencing, positioning, and anterior trunk lean. While sitting EOM, pt performed x 10 reps forward trunk lean with cues to reach for her socks to facilitate forward trunk lean in preparation for gait, pt stated it felt good on her back. Pt ambulated 3' with RW and mod-maxA, pt appeared unsafe with poor standing balance and gait mechanics d/t  significant truncal instability and fatigue, ambulated 3' backward to mat. Attempted to stand with pt with her arm around [name] however pt began to lose balance laterally d/t truncal instability and fatigue. Pt ambulated 20' with B hands on therapist shoulders, while therapist seated on stool in front of pt to provide modA for weight shifting, UE support, cues for posture and sequencing. Pt significantly fatigued after gait requiring continual verbal stimulation to keep eyes open. Asked pt to try standing for 1 more repetition and she denied multiple times. Pt worked on sitting at edge of TIS with B feet support and back unsupported x2 minutes with intermittent CGA to assist forward trunk lean. Pt transported in [place] back to room, left in w/c with needs in reach and chair alarm set.    Therapy Documentation Precautions:  Precautions Precautions: Fall Precaution Comments: diplopia Restrictions Weight Bearing Restrictions: No    Therapy/Group: Individual Therapy  [name] SPT 06/27/2019, 7:42 AM

## 2019-06-27 NOTE — Progress Notes (Signed)
Occupational Therapy Session Note  Patient Details  Name: Tanya Knight MRN: [id number] Date of Birth: 1940/11/16  Today's Date: 06/27/2019 OT Individual Time: 0902-1002 OT Individual Time Calculation (min): 60 min    Short Term Goals: Week 2:  OT Short Term Goal 1 (Week 2): Pt will maintain sustained attention for 30 mins with no more than min instructional cueing during ADL tasks. OT Short Term Goal 2 (Week 2): Pt will complete UB selfcare in sitting with supervision. OT Short Term Goal 3 (Week 2): Pt will complete LB dressing sit to stand with mod assist. OT Short Term Goal 4 (Week 2): Pt will complete toilet transfers stand pivot with the RW with min assist.  Skilled Therapeutic Interventions/Progress Updates:    Pt in bed with eyes closed to start session.  She opened them to therapist's voice, but could not state the name of the therapist.  She needed mod questioning cueing to determine day of the month with min questioning cueing for the day of the week.  She was able to transfer from supine to sit with mod assist.  She then transferred with mod assist stand pivot to the tilt in space wheelchair stand pivot.  She was able to work on selfcare tasks sit to stand at the sink.  Supervision with min instructional cueing for UB bathing.  Pt with decreased consistency when reaching for the washcloth or when trying to put soap in the wash pan secondary to diplopia.  She continues to close her right eye at times when looking at objects.  When asked why, she is unable to answer.  If an object is presented at midline and she is asked "How many do you see?" she is inconsistent.  Throughout bathing and dressing she would need max assist for dynamic sitting balance on the edge of the wheelchair.  When not engaged in task, she would let her trunk go back in the chair and then close her eyes.  Mod instructional cueing was needed to maintain sustained attention to tasks overall.  Mod assist was needed for  standing when washing peri area or when pulling garments over her hips.  Mod assist was also needed for threading all items over her feet including the gripper socks.  She was left sitting in the tilt in space wheelchair tilted back with call button and phone in reach and safety belt in place.    Therapy Documentation Precautions:  Precautions Precautions: Fall Precaution Comments: diplopia Restrictions Weight Bearing Restrictions: No   Pain: Pain Assessment Pain Scale: Faces Pain Score: 0-No pain ADL: See Care Tool Section for some details of mobility and selfcare  Therapy/Group: Individual Therapy  Ander Wamser OTR/L 06/27/2019, 12:21 PM

## 2019-06-27 NOTE — Progress Notes (Signed)
Speech Language Pathology Daily Session Note  Patient Details  Name: Tanya Knight MRN: [id number] Date of Birth: [birth year]  Today's Date: 06/27/2019 SLP Individual Time: [id number] SLP Individual Time Calculation (min): 30 min and Today's Date: 06/27/2019 SLP Missed Time: 15 Minutes Missed Time Reason: Patient fatigue  Short Term Goals: Week 2: SLP Short Term Goal 1 (Week 2): Pt will consume current diet with minimal overt s/sx aspiration and demonstrate efficient mastication and oral clearance with Min A verbal/visual cues. SLP Short Term Goal 2 (Week 2): Pt will provide intelligible word and phrase level verbal responses to questions in 75% opportunities with Mod A cues for use of increased rate of speech and vocal intensity. SLP Short Term Goal 3 (Week 2): Pt will initiate during functional familiar tasks in 50% opportunities with Mod A multimodal cues. SLP Short Term Goal 4 (Week 2): Pt will demonstrate emergent awareness evidenced by her ability to detect verbal and/or functional errors during familiar tasks with Mod A visual/verbal cues. SLP Short Term Goal 5 (Week 2): Pt will demonstrate basic problem solving during functional and familiar tasks with Mod A verbal/visual cues.  Skilled Therapeutic Interventions: Skilled treatment session focused on cognitive goals. Upon arrival, patient was lethargic while upright in the wheelchair. Patient required Min verbal cues to recall the current date and more than a reasonable amount of time to initiate making a calendar. Patient able to place initial 4 numbers on the calendar but then was falling asleep. Patient reported the task was "using too much energy" and requested to get back into bed to take a nap. Patient transferred to the bed via the [facility] with +2 assist for safety and Mod verbal cues for sequencing with task. SLP attempted to continue cognitive task while patient was in bed without success. Therefore, patient missed remaining 15 minutes  of session. Patient left supine in bed with alarm on and all needs within reach. Continue with current plan of care.      Pain Pain Assessment Pain Scale: Faces Pain Score: 0-No pain  Therapy/Group: Individual Therapy  [place]ha Behlke 06/27/2019, 12:32 PM

## 2019-06-28 ENCOUNTER — Inpatient Hospital Stay (HOSPITAL_COMMUNITY): Payer: Medicare PPO | Admitting: Occupational Therapy

## 2019-06-28 ENCOUNTER — Inpatient Hospital Stay (HOSPITAL_COMMUNITY): Payer: Medicare PPO | Admitting: Speech Pathology

## 2019-06-28 ENCOUNTER — Inpatient Hospital Stay (HOSPITAL_COMMUNITY): Payer: Medicare PPO | Admitting: Physical Therapy

## 2019-06-28 LAB — VALPROIC ACID LEVEL: Valproic Acid Lvl: 63 ug/mL (ref 50.0–100.0)

## 2019-06-28 MED ORDER — TOPIRAMATE 25 MG PO TABS
25.0000 mg | ORAL_TABLET | Freq: Two times a day (BID) | ORAL | Status: DC
  Administered 2019-06-28 – 2019-07-01 (×6): 25 mg via ORAL
  Filled 2019-06-28 (×6): qty 1

## 2019-06-28 NOTE — Progress Notes (Signed)
Physical Therapy Session Note  Patient Details  Name: Tanya Knight MRN: [id number] Date of Birth: 12/08/1940  Today's Date: 06/28/2019 PT Individual Time: 0916-1004 PT Individual Time Calculation (min): 48 min   Short Term Goals: Week 2:  PT Short Term Goal 1 (Week 2): Pt will ambulate x 20 ft with LRAD and mod assist PT Short Term Goal 2 (Week 2): Pt will perform bed<>chair transfer with min assist PT Short Term Goal 3 (Week 2): Pt will tolertate OOB activity x 45 minutes while fully participating in therapy session PT Short Term Goal 4 (Week 2): Pt will maintain static sitting balance with CGA  Skilled Therapeutic Interventions/Progress Updates:    Patient supine in bed upon PT arrival, agreeable to therapy tx, reports 8/10 HA but states better than yesterday and that having the blinds closed has helped. Therapist donned TEDs, socks, and pants maxA for LE treading, pt assisted by following verbal commands to move R/L LE and bridging. Rolling to R with CGA and verbal cues for sequencing and hand placement > sidelying > sitting EOB with minA to come to upright posture, pt demonstrated improved ability to utilize UE to push through and LE management. Stand pivot transfer > TIS with modA + HHA to stabilize d/t posterolateral leaning secondary to truncal instability and cues for sequencing, posture, weight shifting, and positioning. With verbal cues, pt demonstrated ability to scoot hips back into seat for optimal positioning. Transported pt to rehab gym via [place] for energy conservation and time management. Stand pivot transfer from TIS > mat table modA. Throughout sitting balance, standing balance and gait, therapist positioned in front of pt seated on stool so that in standing pt could rest B hands on therapist's shoulders for stability d/t improved safety and ability to perform activity. In sitting, pt performed dynamic sitting balance reaching to therapist's hand at various locations, therapist  providing intermittent minA to correct for posterior trunk leaning, extensive verbal cueing to lean forward, pt responded well to "bring your chest towards therapist's head." Pt demonstrates difficulty with depth perception and dissociated L/R (I.e. when tapping L hand to reach and given verbal cues, pt will attempt to reach with R hand instead). Pt performed reaching for toes in sitting to stretch back and facilitate anterior trunk lean prior to standing activities. Sitting EOM > stand with minA to come upright and intermittent modA for anterior trunk lean. In standing, pt reached for bean bags held by another therapist at various heights/distances to facilitate unilateral UE support and weight shifting, with modA from therapist for posturing and stability in standing. Used Geologist, engineering for Warden/ranger. Pt stated her back was tired, standing > sit with minA, pt rested with wedge and pillow for support behind her, pt performed x5 knee fall outs R<>L in hooklying. Supine> sitting EOM with modA for trunk stability coming to upright. Sit > stand with minA > pt ambulated 10' with modA for posterolateral trunk instability + 2 for w/c follow, for stability and facilitation of weight shifting for improved step length, cues for step length, anterior trunk lean, and sequencing. Stand > sitting in TIS with minA cues for seqeuncing. Transported pt to // bars > sit > standing in // bars with minA continues cues for posture. Pt ambulated fwd and back in // bars x1 with minA from therapist to facilitate weight shifting, continued cueing as previously described. Pt transported back to room at end of session in TIS. Pt left in TIS with needs in reach and  chair alarm set.   Therapy Documentation Precautions:  Precautions Precautions: Fall Precaution Comments: diplopia Restrictions Weight Bearing Restrictions: No   Therapy/Group: Individual Therapy  [name] SPT 06/28/2019, 7:37 AM

## 2019-06-28 NOTE — Progress Notes (Signed)
Occupational Therapy Session Note  Patient Details  Name: Tanya Knight MRN: [id number] Date of Birth: 09/17/1940  Today's Date: 06/28/2019 OT Individual Time: [id number] OT Individual Time Calculation (min): 43 min    Short Term Goals: Week 2:  OT Short Term Goal 1 (Week 2): Pt will maintain sustained attention for 30 mins with no more than min instructional cueing during ADL tasks. OT Short Term Goal 2 (Week 2): Pt will complete UB selfcare in sitting with supervision. OT Short Term Goal 3 (Week 2): Pt will complete LB dressing sit to stand with mod assist. OT Short Term Goal 4 (Week 2): Pt will complete toilet transfers stand pivot with the RW with min assist.  Skilled Therapeutic Interventions/Progress Updates:    Pt completed supine to sit EOB with min assist.  Once on the EOB, she worked on donning her pants and gripper socks.  Min guard assist needed for static sitting with mod assist for dynamic sitting when donning clothing.  She completed stand pivot transfer to the wheelchair with mod assist.  Therapist took her down to the dayroom where her vision was examined further.  She demonstrates decreased tracking bilaterally with impaired ability to maintain fixation on moving target as well as demonstrating jerky movements.  Decreased superior ROM is also noted bilaterally as well.  Horizontal nystagmus is present with fixed gaze slightly to the right of midline.  She was unable to converge the left eye as it would split out laterally while the right eye moved toward midline.  She reported no diplopia with near vision in any quadrant.  Visual fields were also [place] for gross testing.  When having her complete finger to nose, she was slower than normal on both sides, but demonstrated more accuracy than what has been seen in prior sessions.  She does still close her right eye at times, but she is unable to state why other than "just clearing her vision".  Will continue to examine vestibular  aspects in further treatments, but she reported no dizziness this session with sitting up.  Finished session with transport back to the room and pt left sitting up in the tilt in space wheelchair with her son present and call button and phone in reach.    Therapy Documentation Precautions:  Precautions Precautions: Fall Precaution Comments: diplopia Restrictions Weight Bearing Restrictions: No   Pain: Pain Assessment Pain Scale: Faces Pain Score: 0-No pain ADL: See Care Tool for some details of mobility and selfcare  Therapy/Group: Individual Therapy  Renesha Lizama OTR/L 06/28/2019, 4:25 PM

## 2019-06-28 NOTE — Plan of Care (Signed)
  Problem: Consults Goal: RH STROKE PATIENT EDUCATION Description: See Patient Education module for education specifics  Outcome: Progressing   Problem: RH BOWEL ELIMINATION Goal: RH STG MANAGE BOWEL WITH ASSISTANCE Description: STG Manage Bowel with min Assistance. Outcome: Progressing Goal: RH STG MANAGE BOWEL W/MEDICATION W/ASSISTANCE Description: STG Manage Bowel with Medication with min Assistance. Outcome: Progressing   Problem: RH BLADDER ELIMINATION Goal: RH STG MANAGE BLADDER WITH ASSISTANCE Description: STG Manage Bladder With min Assistance Outcome: Progressing   Problem: RH SKIN INTEGRITY Goal: RH STG SKIN FREE OF INFECTION/BREAKDOWN Description: Patients skin will remain free from further infection or breakdown with min assist. Outcome: Progressing Goal: RH STG MAINTAIN SKIN INTEGRITY WITH ASSISTANCE Description: STG Maintain Skin Integrity With min Assistance. Outcome: Progressing Goal: RH STG ABLE TO PERFORM INCISION/WOUND CARE W/ASSISTANCE Description: STG Able To Perform Incision/Wound Care With min/mod Assistance. Outcome: Progressing   Problem: RH SAFETY Goal: RH STG ADHERE TO SAFETY PRECAUTIONS W/ASSISTANCE/DEVICE Description: STG Adhere to Safety Precautions With min Assistance/Device. Outcome: Progressing   Problem: RH KNOWLEDGE DEFICIT Goal: RH STG INCREASE KNOWLEDGE OF HYPERTENSION Description: Patient/caregiver will verbalize understanding of management of HTN including diet, exercise, medications, monitoring, and follow up care with min assist. Outcome: Progressing Goal: RH STG INCREASE KNOWLEGDE OF HYPERLIPIDEMIA Description: Patient/caregiver will verbalize understanding of management of HLD including diet, exercise, medications, monitoring, and follow up care with min assist. Outcome: Progressing Goal: RH STG INCREASE KNOWLEDGE OF STROKE PROPHYLAXIS Description: Patient/caregiver will verbalize understanding of management of stroke prophylaxis  including diet, exercise, medications, monitoring, and follow up care with min assist. Outcome: Progressing

## 2019-06-28 NOTE — Progress Notes (Signed)
Speech Language Pathology Daily Session Note  Patient Details  Name: Tanya Knight MRN: [id number] Date of Birth: 09/30/1940  Today's Date: 06/28/2019 SLP Individual Time: 1105-1150 SLP Individual Time Calculation (min): 45 min  Short Term Goals: Week 2: SLP Short Term Goal 1 (Week 2): Pt will consume current diet with minimal overt s/sx aspiration and demonstrate efficient mastication and oral clearance with Min A verbal/visual cues. SLP Short Term Goal 2 (Week 2): Pt will provide intelligible word and phrase level verbal responses to questions in 75% opportunities with Mod A cues for use of increased rate of speech and vocal intensity. SLP Short Term Goal 3 (Week 2): Pt will initiate during functional familiar tasks in 50% opportunities with Mod A multimodal cues. SLP Short Term Goal 4 (Week 2): Pt will demonstrate emergent awareness evidenced by her ability to detect verbal and/or functional errors during familiar tasks with Mod A visual/verbal cues. SLP Short Term Goal 5 (Week 2): Pt will demonstrate basic problem solving during functional and familiar tasks with Mod A verbal/visual cues.  Skilled Therapeutic Interventions: Pt was seen for skilled ST intervention targeting aforementioned goals. Pt was pleasant and cooperative with unfamiliar therapist, and was noted to be fully alert upon arrival of SLP. Pt speech was fully intelligible today, without need for repetition or cues. SLP facilitated session by administering the Cerebellar-Cognitive Affective/Schmahmann Syndrome Scale (CCAS-Scale). Pt failed 10/10 subtests, including semantic fluency (animal naming), phonemic fluency (words beginning with a specific letter), category switching (vegetable/profession), immediate and delayed recall (recognized 2/5 words given multiple choice options), digit repetition (forward and backward), figure drawing/copying (cube), similarities, and attention. After 45 minutes, pt stopped responding to  questions. Pt then indicated she was too tired to continue. Pt missed 15 minutes of therapy today due to fatigue.  Pt was left in wheelchair with alarm on, all needs within reach. Continue ST per current plan of care.  Pain Pain Assessment Pain Scale: 0-10 Pain Score: 0-No pain  Therapy/Group: Individual Therapy   Cheney Gosch B. Quentin Ore, [facility], CCC-SLP Speech Language Pathologist  Shonna Chock 06/28/2019, 12:28 PM

## 2019-06-28 NOTE — Progress Notes (Signed)
[facility] PHYSICAL MEDICINE & REHABILITATION PROGRESS NOTE   Subjective/Complaints:  No issues overnite, pt vocal volume louder anxius, asking about her "pee pad", talking about puppies using "pee pad",  Increased diplopia Still c/o HA  ROS- no CP, SOB, no abd pain Denies heartburn   Objective:   No results found. Recent Labs    06/27/19 0723  WBC 10.5  HGB 13.1  HCT 38.1  PLT 328   Recent Labs    06/27/19 0723  NA 138  K 3.9  CL 107  CO2 20*  GLUCOSE 105*  BUN 31*  CREATININE 0.66  CALCIUM 9.1    Intake/Output Summary (Last 24 hours) at 06/28/2019 0838 Last data filed at 06/28/2019 0836 Gross per 24 hour  Intake 580 ml  Output --  Net 580 ml     Physical Exam: Vital Signs Blood pressure 130/67, pulse 70, temperature 97.9 F (36.6 C), resp. rate 16, weight 55.8 kg, SpO2 99 %. General: No acute distress,  A little less lethargic, eyes open entire conversation; in bed, fatigued.  Mood and affect are appropriate Heart: Regular rate and rhythm no rubs murmurs or extra sounds Lungs: Clear to auscultation, breathing unlabored, no rales or wheezes Abdomen: Positive bowel sounds, soft nontender to palpation, nondistended Extremities: No clubbing, cyanosis, or edema Skin: No evidence of breakdown, no evidence of rash Neurologic: oriented to person place time (month not day) and situation  motor strength is 4/5 in bilateral deltoid, bicep, tricep, grip, hip flexor, knee extensors, ankle dorsiflexor and plantar flexor Sensory exam normal sensation to light touch and proprioception in bilateral upper and lower extremities Cerebellar exam moderate dysmetria on L and MIld on RIght l finger to nose to finger as well as heel to shin in bilateral upper and lower extremities Musculoskeletal: Full range of motion in all 4 extremities. No joint swelling   Assessment/Plan: 1. Functional deficits secondary to Cerebellar vermis ICH which require 3+ hours per day of  interdisciplinary therapy in a comprehensive inpatient rehab setting.  Physiatrist is providing close team supervision and 24 hour management of active medical problems listed below.  Physiatrist and rehab team continue to assess barriers to discharge/monitor patient progress toward functional and medical goals  Care Tool:  Bathing     Body parts bathed by patient: Right arm, Left arm, Chest, Abdomen, Front perineal area, Right upper leg, Left upper leg, Face   Body parts bathed by helper: Right lower leg, Left lower leg, Buttocks Body parts n/a: Abdomen, Chest(did not attempt this session)   Bathing assist Assist Level: Moderate Assistance - Patient 50 - 74%     Upper Body Dressing/Undressing Upper body dressing   What is the patient wearing?: Pull over shirt    Upper body assist Assist Level: Supervision/Verbal cueing    Lower Body Dressing/Undressing Lower body dressing      What is the patient wearing?: Pants, Incontinence brief     Lower body assist Assist for lower body dressing: Maximal Assistance - Patient 25 - 49%     Toileting Toileting    Toileting assist Assist for toileting: Maximal Assistance - Patient 25 - 49%     Transfers Chair/bed transfer  Transfers assist  Chair/bed transfer activity did not occur: Safety/medical concerns  Chair/bed transfer assist level: Moderate Assistance - Patient 50 - 74%     Locomotion Ambulation   Ambulation assist   Ambulation activity did not occur: Safety/medical concerns  Assist level: Moderate Assistance - Patient 50 - 74% Assistive  device: Other (comment)(B hand on therapist's shoulders while PT sitting on stool in front of pt) Max distance: 20'   Walk 10 feet activity   Assist  Walk 10 feet activity did not occur: Safety/medical concerns  Assist level: Moderate Assistance - Patient - 50 - 74% Assistive device: Other (comment)(B hand on therapist's shoulders while PT sitting on stool in front of pt)    Walk 50 feet activity   Assist Walk 50 feet with 2 turns activity did not occur: Safety/medical concerns         Walk 150 feet activity   Assist Walk 150 feet activity did not occur: Safety/medical concerns         Walk 10 feet on uneven surface  activity   Assist Walk 10 feet on uneven surfaces activity did not occur: Safety/medical concerns         Wheelchair     Assist Will patient use wheelchair at discharge?: No(Not anticipated; no LT goals set)   Wheelchair activity did not occur: Safety/medical concerns         Wheelchair 50 feet with 2 turns activity    Assist    Wheelchair 50 feet with 2 turns activity did not occur: Safety/medical concerns       Wheelchair 150 feet activity     Assist  Wheelchair 150 feet activity did not occur: Safety/medical concerns       Blood pressure 130/67, pulse 70, temperature 97.9 F (36.6 C), resp. rate 16, weight 55.8 kg, SpO2 99 %.    1.  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  Team conf in am   Continue PT, OT, SLP 15/7               -patient may  shower 2.  Antithrombotics: -DVT/anticoagulation: Subcutaneous heparin initiated 06/11/2019             -antiplatelet therapy: N/A 3. Pain Management: Continue Tramadol as needed, Fioricet as needed for headaches 4. Mood: Provide appears more anxious this am, increased diplopia- likely side effect of increased topamax              -antipsychotic agents: N/A 5. Neuropsych: This patient is NOT capable of making decisions on her own behalf. 6. Skin/Wound Care: Continue routine skin checks 7. Fluids/Electrolytes/Nutrition: Routine in and outs with follow-up chemistries 8.  Hypertension.  Norvasc 10 mg daily, Toprol-XL 50 mg daily.  Monitor with increased mobility- monitor closely.    Vitals:   06/27/19 2019  06/28/19 0551  BP: 112/60 130/67  Pulse: 67 70  Resp: 16 16  Temp: (!) 97.3 F (36.3 C) 97.9 F (36.6 C)  SpO2: 99% 99%    2/16: well controlled.  9.  History of seizure disorder.  Continue Depakote 500 mg every 12 hours For old seizure d/o as well as prevention of new seizures 10. Diplopia- suggest eye patch intermittently to help with double vision 11. Hypokalemia- last K+ 2.6- will supplement with K CL IV riders  will monitor, also with mild hyponatremia , not on diuretics   2/6- gave KCl 40 mEq x3 in 24 hours and also 10 mEq hourly by IVFs  E lyte abnormaility, probable SIADH , low K+, Mg+++ and Na+ improved Nephro signed off    2/15: K+ is stable at 3.8. Magnesium continues to trend upward; currently 1.9.    BMP Latest Ref Rng &  Units 06/27/2019 06/24/2019 06/22/2019  Glucose 70 - 99 mg/dL 105(H) 97 103(H)  BUN 8 - 23 mg/dL 31(H) 22 13  Creatinine 0.44 - 1.00 mg/dL 0.66 0.62 0.44  Sodium 135 - 145 mmol/L 138 133(L) 133(L)  Potassium 3.5 - 5.1 mmol/L 3.9 3.8 3.8  Chloride 98 - 111 mmol/L 107 99 104  CO2 22 - 32 mmol/L 20(L) 22 18(L)  Calcium 8.9 - 10.3 mg/dL 9.1 9.4 8.9    12. Vascular HA, persists despite topamax increase, will reduce dose to 25mg  Check serum valproate level , may be able to increase dose to help with HA  13. Nausea/+/- vomiting- improved intake better meal intake 25-80% 14 GERD will change PPI to H2 blocker to help with Mg+++ absorption  14. Dysphagia due to CVA   LOS: 14 days A FACE TO FACE EVALUATION WAS PERFORMED  Charlett Blake 06/28/2019, 8:38 AM

## 2019-06-29 ENCOUNTER — Inpatient Hospital Stay (HOSPITAL_COMMUNITY): Payer: Medicare PPO | Admitting: Physical Therapy

## 2019-06-29 ENCOUNTER — Inpatient Hospital Stay (HOSPITAL_COMMUNITY): Payer: Medicare PPO | Admitting: Speech Pathology

## 2019-06-29 ENCOUNTER — Inpatient Hospital Stay (HOSPITAL_COMMUNITY): Payer: Medicare PPO | Admitting: Occupational Therapy

## 2019-06-29 NOTE — Progress Notes (Signed)
Team Conference Report to Patient/Family  Team Conference discussion was reviewed with the patient and caregiver/Son, including goals for CGA/Min guard overall due to issues with posterior lean, sitting balance/rt side lean, any changes in plan of care adjusted goals based on progression and increased attention, alertness noted this week and target discharge date extended due to change in status/missed first week of therapy.  Patient and caregiver express understanding and are in agreement.  The patient has a target discharge date of 07/13/19. Family education set up for 07/11/19 with spouse and son.  Dorien Chihuahua B 06/29/2019, 4:06 PM

## 2019-06-29 NOTE — Progress Notes (Addendum)
Speech Language Pathology Weekly Progress and Session Note  Patient Details  Name: Tanya Knight MRN: [id number] Date of Birth: June 02, 1940  Beginning of progress report period: June 22, 2019 End of progress report period: June 29, 2019  Today's Date: 06/29/2019 SLP Individual Time: 4235-3614 SLP Individual Time Calculation (min): 30 min  Short Term Goals: Week 2: SLP Short Term Goal 1 (Week 2): Pt will consume current diet with minimal overt s/sx aspiration and demonstrate efficient mastication and oral clearance with Min A verbal/visual cues. SLP Short Term Goal 1 - Progress (Week 2): Met SLP Short Term Goal 2 (Week 2): Pt will provide intelligible word and phrase level verbal responses to questions in 75% opportunities with Mod A cues for use of increased rate of speech and vocal intensity. SLP Short Term Goal 2 - Progress (Week 2): Met SLP Short Term Goal 3 (Week 2): Pt will initiate during functional familiar tasks in 50% opportunities with Mod A multimodal cues. SLP Short Term Goal 3 - Progress (Week 2): Met SLP Short Term Goal 4 (Week 2): Pt will demonstrate emergent awareness evidenced by her ability to detect verbal and/or functional errors during familiar tasks with Mod A visual/verbal cues. SLP Short Term Goal 4 - Progress (Week 2): Met SLP Short Term Goal 5 (Week 2): Pt will demonstrate basic problem solving during functional and familiar tasks with Mod A verbal/visual cues. SLP Short Term Goal 5 - Progress (Week 2): Met    New Short Term Goals: Week 3: SLP Short Term Goal 1 (Week 3): Pt will consume current diet with minimal overt s/sx aspiration and demonstrate efficient mastication and oral clearance with Supervision A verbal/visual cues. SLP Short Term Goal 2 (Week 3): Pt will achieve 90% intelligibility at sentence level in 8/10 opportunities with Supervision A cues for use of increased rate of speech and vocal intensity. SLP Short Term Goal 3 (Week 3): Pt will  initiate during functional familiar tasks in 80% opportunities with Min A multimodal cues. SLP Short Term Goal 4 (Week 3): Pt will demonstrate emergent awareness evidenced by her ability to detect verbal and/or functional errors during familiar tasks with Min A visual/verbal cues. SLP Short Term Goal 5 (Week 3): Pt will demonstrate basic problem solving during functional and familiar tasks with Min A verbal/visual cues. SLP Short Term Goal 6 (Week 3): Pt will demonstrate OX4 with Supervision A verbal/visual cues for use of external aids.  Weekly Progress Updates: Pt has made functional gains and met 5 out of 5 short term goals. Pt is currently ~Mod assist due to mild dysphagia, dysarthria, and cognitive deficits with a more moderate impact on daily functioning. Pt is consuming Dys 3 (mech soft) diet with thin liquids and required full supervision primarily due to fluctuating levels of fatigue that requires cues to maintain alertness and attention to intake. Pt has demonstrated improved basic problem solving and initiation during tasks and well as verbal output and intelligibility through use of increased vocal intensity. Pt education is ongoing; pt's son was present for early ST session but would benefit from continued education regarding current deficits prior to discharge. Pt would continue to benefit from skilled ST while inpatient in order to maximize functional independence and reduce burden of care prior to discharge. Anticipate that pt will need 24/7 supervision at discharge in addition to [place] follow up at next level of care.     Intensity: Minumum of 1-2 x/day, 30 to 90 minutes Frequency: 3 to 5 out of 7  days Duration/Length of Stay: 07/06/19 Treatment/Interventions: Cognitive remediation/compensation;Cueing hierarchy;Functional tasks;Patient/family education;Speech/Language facilitation;Internal/external aids;Dysphagia/aspiration precaution training   Daily Session  Skilled Therapeutic  Interventions: Pt was seen for skilled ST targeting cognitive goals. During a familiar calendar creation task, pt sequenced numbers on a monthly calendar with overall Min A for problem solving. She also oriented to date with increased Mod A cues using monthly calendar. Pt's speech is significantly improved since last encounter with this SLP; she independently used increased vocal intensity and rate to achieve 90% intelligibly at the sentence level. SLP also assisted pt with functional problem solving using call bell to request medication and hot pack for headache. Pt left laying in bed with alarm set and needs within reach. Continue per current plan of care.           Pain Pain Assessment Pain Score: Asleep  Therapy/Group: Individual Therapy  Arbutus Leas 06/29/2019, 7:19 AM

## 2019-06-29 NOTE — Plan of Care (Signed)
  Problem: Consults Goal: RH STROKE PATIENT EDUCATION Description: See Patient Education module for education specifics  Outcome: Progressing   Problem: RH BOWEL ELIMINATION Goal: RH STG MANAGE BOWEL WITH ASSISTANCE Description: STG Manage Bowel with min Assistance. Outcome: Progressing Goal: RH STG MANAGE BOWEL W/MEDICATION W/ASSISTANCE Description: STG Manage Bowel with Medication with min Assistance. Outcome: Progressing   Problem: RH BLADDER ELIMINATION Goal: RH STG MANAGE BLADDER WITH ASSISTANCE Description: STG Manage Bladder With min Assistance Outcome: Progressing   Problem: RH SKIN INTEGRITY Goal: RH STG SKIN FREE OF INFECTION/BREAKDOWN Description: Patients skin will remain free from further infection or breakdown with min assist. Outcome: Progressing Goal: RH STG MAINTAIN SKIN INTEGRITY WITH ASSISTANCE Description: STG Maintain Skin Integrity With min Assistance. Outcome: Progressing Goal: RH STG ABLE TO PERFORM INCISION/WOUND CARE W/ASSISTANCE Description: STG Able To Perform Incision/Wound Care With min/mod Assistance. Outcome: Progressing   Problem: RH SAFETY Goal: RH STG ADHERE TO SAFETY PRECAUTIONS W/ASSISTANCE/DEVICE Description: STG Adhere to Safety Precautions With min Assistance/Device. Outcome: Progressing   Problem: RH KNOWLEDGE DEFICIT Goal: RH STG INCREASE KNOWLEDGE OF HYPERTENSION Description: Patient/caregiver will verbalize understanding of management of HTN including diet, exercise, medications, monitoring, and follow up care with min assist. Outcome: Progressing Goal: RH STG INCREASE KNOWLEGDE OF HYPERLIPIDEMIA Description: Patient/caregiver will verbalize understanding of management of HLD including diet, exercise, medications, monitoring, and follow up care with min assist. Outcome: Progressing Goal: RH STG INCREASE KNOWLEDGE OF STROKE PROPHYLAXIS Description: Patient/caregiver will verbalize understanding of management of stroke prophylaxis  including diet, exercise, medications, monitoring, and follow up care with min assist. Outcome: Progressing

## 2019-06-29 NOTE — Patient Care Conference (Signed)
Inpatient RehabilitationTeam Conference and Plan of Care Update Date: 06/29/2019   Time: 10:40 AM    Patient Name: Tanya Knight      Medical Record Number: [id number]  Date of Birth: January 18, 1941 Sex: Female         Room/Bed: 4W21C/4W21C-01 Payor Info: Payor: [employer] MEDICARE / Plan: [employer] MEDICARE CHOICE PPO / Product Type: *No Product type* /    Admit Date/Time:  06/14/2019  3:24 PM  Primary Diagnosis:  ICH (intracerebral hemorrhage) [facility])  Patient Active Problem List   Diagnosis Date Noted  . Hyperlipidemia 06/14/2019  . Cerebellar vermis ICH with SDH and SAH, likely hypertensive 06/10/2019  . Hyponatremia 09/08/2014  . Advance directive discussed with patient 09/08/2014  . Routine general medical examination at a health care facility 09/03/2011  . Essential hypertension, benign 10/12/2006  . GERD 10/12/2006  . Osteoporosis 10/12/2006  . Seizure disorder ([place]) 10/12/2006    Expected Discharge Date: Expected Discharge Date: 07/13/19  Team Members Present: Physician leading conference: Dr. Alysia Penna Social Worker Present: Lennart Pall, LCSW Nurse Present: Dorien Chihuahua, RN;Other (comment)(Susan Truman Hayward, RN) Case Manager: Karene Fry, RN PT Present: Burnard Bunting, PT OT Present: Clyda Greener, OT SLP Present: Jettie Booze, CF-SLP PPS Coordinator present : Gunnar Fusi, SLP     Current Status/Progress Goal Weekly Team Focus  Bowel/Bladder   cont of B&B, pt requesting to utilize bed pan and stedy to get to bathroom  remain cont. of bowel and bladder,   assess q shift and orn, be sure to answer cll bells in timly manner   Swallow/Nutrition/ Hydration   Dys 3/thin, full supervision  Supervision A  tolerance diet, sustained attention to intake, trials regular if alterness and endurance improves   ADL's   supervision to min assist for UB selfcare, mod assist for LB selfcare sit to stand. mod assist for stand pivot transfers.  Decreased visual tracking, decreased sustained attention   currently contact guard overall at this time  selfcare retraining, balance retraining, transfer training, therapeutic activities, pt/family education, neuromsucular re-education   Mobility   improved alertness and attention to task requiring less sitmulation to remain alert during sessions CGA-minA for all bed mobility, minA supine<>sit, Supervision-CGA for static sitting and min-modA for dynamic sitting balance due to posterior lean, modA transfers except minA sit<>stand, modA gait 20' +2 for w/c follow, minA gait in //bars  supervision - will be downgrading on thursday depending on goal assessment and LOS  transfer training, standing balance dynamic & static, gait, trunk control with dyanmic sitting, activity tolerance   Communication   90-100% intelligible when fully alert  Supervision A  increased vocal intensity   Safety/Cognition/ Behavioral Observations  Mod A initiation, emergent awareness, recall  Supervision A  initiation, basic problem solving, emergent awareness, recall   Pain   pt c/o headace level 6/10   maintain pain <3  assess pain q shift and prn    Skin   bruising in elbows and upper lip   remain free of breakdown and infection  assess skin q sift and prn     Rehab Goals Patient on target to meet rehab goals: Yes *See Care Plan and progress notes for long and short-term goals.     Barriers to Discharge  Current Status/Progress Possible Resolutions Date Resolved   Nursing                  PT  OT                  SLP                SW Inaccessible home environment;Decreased caregiver support;Lack of/limited family support;Incontinence;Insurance for SNF coverage Home with spouse and son will assist as needed after work Son assisting both parents as needed; spouse uses a cane to walk and son works; assess need for hired help          Discharge Planning/Teaching Needs:  Home with spouse and son  Transfer, B+D, use of equipment, medication  management   Team Discussion: Perked up, headaches, tylenol for headaches, some confusion.  RN cont bladder, confusion, meds in apple sauce, TEDs on, bruising R hand and L leg, has eye patch.  OT S/min UB EOB, LB mod A, sit to stand min a, falls back, transfers mod A, ataxia LUE, visual issues, goals S/CGA, fatigues easily.  PT fatigues, needs rest breaks, min/mod transfers, amb min/mod in parallel bars, mod/max amb outside parallel bars, goals S.  SLP participating fully, STM and problem solving main issue, goals S, currently min/mod A.  Husband with cane and son works.  Hopeful for S level at time of DC.   Revisions to Treatment Plan: N/A     Medical Summary Current Status: Level of alertness is improving.,  Improved meal intake.  Blood pressure overall improving although some lability remains Weekly Focus/Goal: Headache management, tends to do better on a lower dose of Topamax from a cognitive standpoint may need to supplement with Tylenol  Barriers to Discharge: Medical stability;Other (comments)  Barriers to Discharge Comments: Vascular headache Possible Resolutions to Barriers: Continue rehabilitation program, see above, family training prior to discharge   Continued Need for Acute Rehabilitation Level of Care: The patient requires daily medical management by a physician with specialized training in physical medicine and rehabilitation for the following reasons: Direction of a multidisciplinary physical rehabilitation program to maximize functional independence : Yes Medical management of patient stability for increased activity during participation in an intensive rehabilitation regime.: Yes Analysis of laboratory values and/or radiology reports with any subsequent need for medication adjustment and/or medical intervention. : Yes   I attest that I was present, lead the team conference, and concur with the assessment and plan of the team.   Jodell Cipro M 06/29/2019, 2:51 PM   Team  conference was held via web/ teleconference due to [place] - 19

## 2019-06-29 NOTE — Progress Notes (Signed)
[facility] PHYSICAL MEDICINE & REHABILITATION PROGRESS NOTE   Subjective/Complaints:   No issues overnite , pt did well in PT this am  Good appetite   ROS- no CP, SOB, no abd pain Denies heartburn   Objective:   No results found. Recent Labs    06/27/19 0723  WBC 10.5  HGB 13.1  HCT 38.1  PLT 328   Recent Labs    06/27/19 0723  NA 138  K 3.9  CL 107  CO2 20*  GLUCOSE 105*  BUN 31*  CREATININE 0.66  CALCIUM 9.1    Intake/Output Summary (Last 24 hours) at 06/29/2019 0824 Last data filed at 06/28/2019 1820 Gross per 24 hour  Intake 470 ml  Output --  Net 470 ml     Physical Exam: Vital Signs Blood pressure 123/72, pulse 74, temperature (!) 97.5 F (36.4 C), resp. rate 16, weight 55.8 kg, SpO2 100 %. General: No acute distress,  A little less lethargic, eyes open entire conversation; in bed, fatigued.  Mood and affect are appropriate Heart: Regular rate and rhythm no rubs murmurs or extra sounds Lungs: Clear to auscultation, breathing unlabored, no rales or wheezes Abdomen: Positive bowel sounds, soft nontender to palpation, nondistended Extremities: No clubbing, cyanosis, or edema Skin: No evidence of breakdown, no evidence of rash Neurologic: oriented to person place time (month not day) and situation  motor strength is 4/5 in bilateral deltoid, bicep, tricep, grip, hip flexor, knee extensors, ankle dorsiflexor and plantar flexor Sensory exam normal sensation to light touch and proprioception in bilateral upper and lower extremities Cerebellar exam moderate dysmetria on L and MIld on RIght l finger to nose to finger as well as heel to shin in bilateral upper and lower extremities Musculoskeletal: Full range of motion in all 4 extremities. No joint swelling   Assessment/Plan: 1. Functional deficits secondary to Cerebellar vermis ICH which require 3+ hours per day of interdisciplinary therapy in a comprehensive inpatient rehab setting.  Physiatrist is  providing close team supervision and 24 hour management of active medical problems listed below.  Physiatrist and rehab team continue to assess barriers to discharge/monitor patient progress toward functional and medical goals  Care Tool:  Bathing     Body parts bathed by patient: Right arm, Left arm, Chest, Abdomen, Front perineal area, Right upper leg, Left upper leg, Face   Body parts bathed by helper: Right lower leg, Left lower leg, Buttocks Body parts n/a: Abdomen, Chest(did not attempt this session)   Bathing assist Assist Level: Moderate Assistance - Patient 50 - 74%     Upper Body Dressing/Undressing Upper body dressing   What is the patient wearing?: Pull over shirt    Upper body assist Assist Level: Supervision/Verbal cueing    Lower Body Dressing/Undressing Lower body dressing      What is the patient wearing?: Pants     Lower body assist Assist for lower body dressing: Moderate Assistance - Patient 50 - 74%     Toileting Toileting    Toileting assist Assist for toileting: Maximal Assistance - Patient 25 - 49%     Transfers Chair/bed transfer  Transfers assist  Chair/bed transfer activity did not occur: Safety/medical concerns  Chair/bed transfer assist level: Moderate Assistance - Patient 50 - 74%     Locomotion Ambulation   Ambulation assist   Ambulation activity did not occur: Safety/medical concerns  Assist level: Moderate Assistance - Patient 50 - 74% Assistive device: Other (comment)(B hands on therapist's shoulders, therapist on stool  in front of pt) Max distance: 50'   Walk 10 feet activity   Assist  Walk 10 feet activity did not occur: Safety/medical concerns  Assist level: Moderate Assistance - Patient - 50 - 74% Assistive device: Other (comment)(B hands on therapist's shoulders, therapist on stool in front of pt)   Walk 50 feet activity   Assist Walk 50 feet with 2 turns activity did not occur: Safety/medical concerns          Walk 150 feet activity   Assist Walk 150 feet activity did not occur: Safety/medical concerns         Walk 10 feet on uneven surface  activity   Assist Walk 10 feet on uneven surfaces activity did not occur: Safety/medical concerns         Wheelchair     Assist Will patient use wheelchair at discharge?: No(Not anticipated; no LT goals set)   Wheelchair activity did not occur: Safety/medical concerns         Wheelchair 50 feet with 2 turns activity    Assist    Wheelchair 50 feet with 2 turns activity did not occur: Safety/medical concerns       Wheelchair 150 feet activity     Assist  Wheelchair 150 feet activity did not occur: Safety/medical concerns       Blood pressure 123/72, pulse 74, temperature (!) 97.5 F (36.4 C), resp. rate 16, weight 55.8 kg, SpO2 100 %.    1.  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  Team conference today please see physician documentation under team conference tab, met with team  to discuss problems,progress, and goals. Formulized individual treatment plan based on medical history, underlying problem and comorbidities.  Continue PT, OT, SLP 15/7               -patient may  shower 2.  Antithrombotics: -DVT/anticoagulation: Subcutaneous heparin initiated 06/11/2019             -antiplatelet therapy: N/A 3. Pain Management: Continue Tramadol as needed, Fioricet as needed for headaches 4. Mood: Provide appears more anxious this am, increased diplopia- likely side effect of increased topamax              -antipsychotic agents: N/A 5. Neuropsych: This patient is NOT capable of making decisions on her own behalf. 6. Skin/Wound Care: Continue routine skin checks 7. Fluids/Electrolytes/Nutrition: Routine in and outs with follow-up chemistries 8.  Hypertension.  Norvasc 10 mg  daily, Toprol-XL 50 mg daily.  Monitor with increased mobility- monitor closely.    Vitals:   06/29/19 0519 06/29/19 0802  BP: (!) 142/87 123/72  Pulse: 70 74  Resp: 16   Temp: (!) 97.5 F (36.4 C)   SpO2: 100%     2/17: well controlled.  9.  History of seizure disorder.  Continue Depakote 500 mg every 12 hours For old seizure d/o as well as prevention of new seizures 10. Diplopia- suggest eye patch intermittently to help with double vision 11. Hypokalemia- last K+ 2.6- will supplement with K CL IV riders  will monitor, also with mild hyponatremia , not on diuretics   2/6- gave KCl 40 mEq x3 in 24 hours and also 10 mEq hourly by IVFs  E lyte abnormaility, probable SIADH , low K+, Mg+++ and Na+ improved Nephro signed off    2/15: K+ is stable at 3.8. Magnesium continues to  trend upward; currently 1.9.    BMP Latest Ref Rng & Units 06/27/2019 06/24/2019 06/22/2019  Glucose 70 - 99 mg/dL 105(H) 97 103(H)  BUN 8 - 23 mg/dL 31(H) 22 13  Creatinine 0.44 - 1.00 mg/dL 0.66 0.62 0.44  Sodium 135 - 145 mmol/L 138 133(L) 133(L)  Potassium 3.5 - 5.1 mmol/L 3.9 3.8 3.8  Chloride 98 - 111 mmol/L 107 99 104  CO2 22 - 32 mmol/L 20(L) 22 18(L)  Calcium 8.9 - 10.3 mg/dL 9.1 9.4 8.9    12. Vascular HA, persists despite topamax increase, will reduce dose to 63m Check serum valproate level , may be able to increase dose to help with HA , pt getting relief with tylenol, cont curren tmanagement  13. Nausea/+/- vomiting- improved intake better meal intake ~50% 14 GERD will change PPI to H2 blocker to help with Mg+++ absorption  14. Dysphagia due to CVA   LOS: 15 days A FACE TO FACE EVALUATION WAS PERFORMED  ACharlett Blake2/17/2021, 8:24 AM

## 2019-06-29 NOTE — Progress Notes (Signed)
Physical Therapy Session Note  Patient Details  Name: Tanya Knight MRN: [id number] Date of Birth: 05/18/1940  Today's Date: 06/29/2019 PT Individual Time: [id number] PT Individual Time Calculation (min): 45 min    Short Term Goals: Week 2:  PT Short Term Goal 1 (Week 2): Pt will ambulate x 20 ft with LRAD and mod assist PT Short Term Goal 2 (Week 2): Pt will perform bed<>chair transfer with min assist PT Short Term Goal 3 (Week 2): Pt will tolertate OOB activity x 45 minutes while fully participating in therapy session PT Short Term Goal 4 (Week 2): Pt will maintain static sitting balance with CGA  Skilled Therapeutic Interventions/Progress Updates:    Patient supine in bed upon PT arrival, agreeable to therapy, states 8/10 HA but continues to report that she thinks it's improving. Therapist donned TEDs, socks, and pants maxA with pt demonstrating ability to follow commands with LE movement to allow for therapist assist for LE threading and to bridge with good hip clearance from bed. Pt performed bridging with 3-5s hold x 10 reps. Supine > sit with CGA-minA for trunk upright, cues for sequencing. Stand pivot transfer from bed > TIS with minA requiring cues for anterior weight shift. Pt performed posterior reciprocal hip scooting with supervision throughout session when sitting in TIS. Transported pt in TIS to rehab gym for energy conservation. Sit > standing in // bars with minA, in standing pt reached for therapist's hand at various heights/distances to facilitate weight shifting, head/eye tracking, and intermittent unilateral UE support. Pt ambulated fwd/retro x2 with seated rest in between repetitions, minA fwd and min-modA retro to facilitate weight shifting, cues for anterior weight shift, upright posture and sequencing. Pt moderately fatigued after gait as demonstrated by inc posterior trunk lean need for inc sequence cueing during second rep of retro gait. Stand > sit with minA for hip  placement in w/c to take rest break. Pt performed hip scooting forward in chair by slouching to slide down but able to bring trunk forward with supervision to sit in high perched position with back unsupported then proceeded to toss horseshoes with CGA-minA to stability x 8 reps each UE. Pt demonstrated improved ability to keep posture in dynamic sitting activity requiring few cues for min cues for anterior trunk lean and head/trunk upright. Pt did not require stimulation to maintain attention to task during session and engaged in conversation throughout. Pt transported back to room in TIS at end of session. Stand pivot transfer to bed with minA + HHA, sit> supine with supervision. Pt left in bed with need sin reach and bed alarm set.   Therapy Documentation Precautions:  Precautions Precautions: Fall Precaution Comments: diplopia Restrictions Weight Bearing Restrictions: No    Therapy/Group: Individual Therapy  [name] SPT 06/29/2019, 7:37 AM

## 2019-06-29 NOTE — Progress Notes (Signed)
Physical Therapy Session Note  Patient Details  Name: Tanya Knight MRN: [id number] Date of Birth: 1941/03/24  Today's Date: 06/29/2019 PT Individual Time: 1137-1205 PT Individual Time Calculation (min): 28 min   Short Term Goals: Week 2:  PT Short Term Goal 1 (Week 2): Pt will ambulate x 20 ft with LRAD and mod assist PT Short Term Goal 2 (Week 2): Pt will perform bed<>chair transfer with min assist PT Short Term Goal 3 (Week 2): Pt will tolertate OOB activity x 45 minutes while fully participating in therapy session PT Short Term Goal 4 (Week 2): Pt will maintain static sitting balance with CGA  Skilled Therapeutic Interventions/Progress Updates: Pt presented in bed agreeable to therapy. Pt performed supine to sit with CGA, increased time and use of bed features. Pt able to scoot to EOB with CGA. Performed blocked practice STS with standard chair placed in front of pt to encourage anterior wt shifting x 5 all performed with minA for facilitation of anterior lean. Pt then indicating may need to go to bathroom. Pt ambulated with RW x 48f to toilet with modA and max cues for staying within RW and stepping to L with LLE for improved L foot placement. Performed toilet transfer with CGA and verbal cues for hand placement with PTA providing maxA for peri-care (+void). Used Stedy for transfer back to bed due to time management. Pt returned sit to supine with supervision and bed flat. Pt repositioned to comfort and left with bed alarm on, call bell within reach and needs met.      Therapy Documentation Precautions:  Precautions Precautions: Fall Precaution Comments: diplopia Restrictions Weight Bearing Restrictions: No General:   Vital Signs:  Pain:   Mobility:   Locomotion :    Trunk/Postural Assessment :    Balance:   Exercises:   Other Treatments:      Therapy/Group: Individual Therapy  [name] 06/29/2019, 3:00 PM

## 2019-06-29 NOTE — Progress Notes (Signed)
Occupational Therapy Session Note  Patient Details  Name: Tanya Knight MRN: [id number] Date of Birth: 19-Apr-1941  Today's Date: 06/29/2019 OT Individual Time: [id number] OT Individual Time Calculation (min): 61 min    Short Term Goals: Week 2:  OT Short Term Goal 1 (Week 2): Pt will maintain sustained attention for 30 mins with no more than min instructional cueing during ADL tasks. OT Short Term Goal 2 (Week 2): Pt will complete UB selfcare in sitting with supervision. OT Short Term Goal 3 (Week 2): Pt will complete LB dressing sit to stand with mod assist. OT Short Term Goal 4 (Week 2): Pt will complete toilet transfers stand pivot with the RW with min assist.  Skilled Therapeutic Interventions/Progress Updates:    Pt completed supine to sit with min guard assist to start session.  She then used the RW for ambulation into the shower for bathing tasks.  Max assist for balance secondary to frequent posterior lean as well as LOB to the right on one occasion.  She was able to remove all clothing prior to showering with min assist and use of the grab bars for support.  She completed all bathing with mod instructional cueing for sequencing bathing in order to apply soap to washcloth and wash all parts of her body.  Once she completed bathing and drying off, she transferred to the wheelchair for dressing at the sink with mod assist.  She was able to donn her pullover shirt with supervision.  Mod assist was needed for donning the brief and pants sit to stand.  She completed combing her hair with min assist and brushing her teeth with supervision.  Left pt in the tilt in space wheelchair with call button in reach and safety belt in place at end of session.    Therapy Documentation Precautions:  Precautions Precautions: Fall Precaution Comments: diplopia Restrictions Weight Bearing Restrictions: No   Vital Signs: Therapy Vitals Temp: 97.8 F (36.6 C) Pulse Rate: 65 Resp: 18 BP:  120/71 Patient Position (if appropriate): Sitting Oxygen Therapy SpO2: 100 % O2 Device: Room Air Pain: Pain Assessment Pain Scale: Faces Pain Score: 0-No pain ADL: See Care Tool Section for some details of mobility and selfcare  Therapy/Group: Individual Therapy  Naika Noto OTR/L 06/29/2019, 3:48 PM

## 2019-06-30 ENCOUNTER — Inpatient Hospital Stay (HOSPITAL_COMMUNITY): Payer: Medicare PPO | Admitting: Speech Pathology

## 2019-06-30 ENCOUNTER — Inpatient Hospital Stay (HOSPITAL_COMMUNITY): Payer: Medicare PPO | Admitting: Physical Therapy

## 2019-06-30 ENCOUNTER — Inpatient Hospital Stay (HOSPITAL_COMMUNITY): Payer: Medicare PPO | Admitting: Occupational Therapy

## 2019-06-30 MED ORDER — ADULT MULTIVITAMIN W/MINERALS CH
1.0000 | ORAL_TABLET | Freq: Every day | ORAL | Status: DC
  Administered 2019-06-30 – 2019-07-13 (×14): 1 via ORAL
  Filled 2019-06-30 (×14): qty 1

## 2019-06-30 MED ORDER — ALUM & MAG HYDROXIDE-SIMETH 200-200-20 MG/5ML PO SUSP
30.0000 mL | ORAL | Status: DC | PRN
  Administered 2019-06-30: 17:00:00 30 mL via ORAL
  Filled 2019-06-30: qty 30

## 2019-06-30 MED ORDER — ENSURE ENLIVE PO LIQD
237.0000 mL | Freq: Two times a day (BID) | ORAL | Status: DC
  Administered 2019-06-30 – 2019-07-07 (×11): 237 mL via ORAL

## 2019-06-30 NOTE — Progress Notes (Signed)
Initial Nutrition Assessment  RD working remotely.   DOCUMENTATION CODES:   Not applicable  INTERVENTION:   Ensure Enlive po BID, each supplement provides 350 kcal and 20 grams of protein  MVI daily  Continue 16ml Pro-stat BID, each supplement provides 100 kcal and 15 grams of protein  Provided education on importance of adequate protein/calorie intake   NUTRITION DIAGNOSIS:   Increased nutrient needs related to other (see comment)(therapies) as evidenced by estimated needs.   GOAL:   Patient will meet greater than or equal to 90% of their needs    MONITOR:   PO intake, Supplement acceptance, Weight trends, Labs  REASON FOR ASSESSMENT:   LOS    ASSESSMENT:   Pt with a PMH significant for HTN, GERD, and seizure disorder presented 06/10/2019 with headache, N/V, weakness and slurred speech. Pt admitted to [facility] with Cerebellar vermis ICH with SDH and SAH, likely hypertensive and admitted to CIR on 2/2.  Pt reports appetite is good and states appetite was good PTA. Pt reports her diet varied quite a bit, but she always had 3 meals a day. Pt denies any wt loss PTA. Discussed importance of adequate protein/calorie intake with pt. Pt agreeable to Ensure Enlive while admitted.   PO intake: 10-65% x last 7 recorded meals (42% average intake)   Medications reviewed and include: Pepcid, 93ml Pro-stat BID, Mag-Ox, Senokot-S Labs reviewed.  NUTRITION - FOCUSED PHYSICAL EXAM:  RD unable to perform at this time.   Diet Order:   Diet Order            DIET DYS 3 Room service appropriate? Yes; Fluid consistency: Thin  Diet effective now              EDUCATION NEEDS:   Education needs have been addressed  Skin:  Skin Assessment: Reviewed RN Assessment  Last BM:  2/18  Height:   Ht Readings from Last 1 Encounters:  06/10/19 5\' 1"  (1.549 m)    Weight:   Wt Readings from Last 1 Encounters:  06/30/19 52.8 kg    BMI:  Body mass index is 21.99  kg/m.  Estimated Nutritional Needs:   Kcal:  1350-1550  Protein:  65-80 grams  Fluid:  >1.35L/d   Larkin Ina, MS, RD, LDN RD pager number and weekend/on-call pager number located in [place].

## 2019-06-30 NOTE — Progress Notes (Signed)
Occupational Therapy Session Note  Patient Details  Name: Tanya Knight MRN: [id number] Date of Birth: February 27, 1941  Today's Date: 06/30/2019 OT Individual Time: 1002-1104 OT Individual Time Calculation (min): 62 min    Short Term Goals: Week 2:  OT Short Term Goal 1 (Week 2): Pt will maintain sustained attention for 30 mins with no more than min instructional cueing during ADL tasks. OT Short Term Goal 2 (Week 2): Pt will complete UB selfcare in sitting with supervision. OT Short Term Goal 3 (Week 2): Pt will complete LB dressing sit to stand with mod assist. OT Short Term Goal 4 (Week 2): Pt will complete toilet transfers stand pivot with the RW with min assist.  Skilled Therapeutic Interventions/Progress Updates:    Pt in bed to start session, agreeable to participate in therapy.  She reports no double vision this morning and therapist discussed with her that she only has to wear the eye patch if she is trying to look at something or participate in therapy and there is double vision.  She voiced understanding.  She does exhibit direction changing horizontal nystagmus at rest, and reports some dizziness with supine to sit.  She was able to transition to sitting this session with supervision.  She worked on donning her pants and shoes at the EOB with overall min guard assist for sitting balance unsupported.  Therapist assisted with TEDS prior to shoes, but then she was able to donn them and tie them with slightly increased time.  She transferred stand pivot to the wheelchair with use of the RW and mod assist.  Grooming tasks of brushing her teeth and combing her hair were completed with supervision overall.  Next, therapist took her down to the ortho gym where she worked on standing balance with use of the Dynavision.  She was able to complete two, one minute intervals with both the left and right hands.  Left hand average reaction time was 3.75 while the right hand was 3.5.  She needed max assist  for standing balance when engaged in Dynavision use secondary to posterior lean.  Increased fatigue was also noted as when pt would sit, she would needed mod instructional cueing to scoot back in the chair, otherwise she would just fall back with her hips out to the edge.  In addition, when she was not stimulated with activity she would sit with her eyes close, requiring mod instructional cueing to open them up.  Finished session with transfer back to the room and pt sitting up in the wheelchair with call button and phone in reach and safety belt in place.    Therapy Documentation Precautions:  Precautions Precautions: Fall Precaution Comments: diplopia Restrictions Weight Bearing Restrictions: No   Pain: Pain Assessment Pain Scale: Faces Pain Score: 0-No pain ADL: See Care Tool Section for some details of selfcare and mobility  Therapy/Group: Individual Therapy  Alexina Niccoli OTR/L 06/30/2019, 12:30 PM

## 2019-06-30 NOTE — Progress Notes (Signed)
Physical Therapy Weekly Progress Note  Patient Details  Name: Tanya Knight MRN: [id number] Date of Birth: 11/30/1940  Beginning of progress report period: June 22, 2019 End of progress report period: June 30, 2019  Today's Date: 06/30/2019 PT Individual Time: 7915-0569 PT Individual Time Calculation (min): 48 min   Patient has met 3 of 4 short term goals. Tanya Knight is demonstrating increasing activity tolerance associated with decreasing lethargy/fatigue and as a result is demonstrating improving progression with functional mobility especially in the past ~4 days. She is performing bed mobility with min assist, sit<>stand and bed<>chair transfers with min/mod assist, and has progressed to gait training up to 8f using B UE support on therapist with mod assist.   Patient continues to demonstrate the following deficits muscle weakness, decreased cardiorespiratoy endurance, impaired timing and sequencing, unbalanced muscle activation, motor apraxia, ataxia, decreased coordination and decreased motor planning, decreased visual acuity, decreased visual perceptual skills and decreased visual motor skills, decreased midline orientation and decreased attention to left, decreased initiation, decreased attention, decreased awareness, decreased problem solving, decreased safety awareness, decreased memory and delayed processing and decreased sitting balance, decreased standing balance, decreased postural control and decreased balance strategies and therefore will continue to benefit from skilled PT intervention to increase functional independence with mobility.  Patient progressing toward long term goals.  Plan of care revisions: Due to slower than expected progress pt's ELOS has been extended by 1 week..  PT Short Term Goals Week 2:  PT Short Term Goal 1 (Week 2): Pt will ambulate x 20 ft with LRAD and mod assist PT Short Term Goal 1 - Progress (Week 2): Met PT Short Term Goal 2 (Week 2): Pt  will perform bed<>chair transfer with min assist PT Short Term Goal 2 - Progress (Week 2): Progressing toward goal PT Short Term Goal 3 (Week 2): Pt will tolertate OOB activity x 45 minutes while fully participating in therapy session PT Short Term Goal 3 - Progress (Week 2): Met PT Short Term Goal 4 (Week 2): Pt will maintain static sitting balance with CGA PT Short Term Goal 4 - Progress (Week 2): Met Week 3:  PT Short Term Goal 1 (Week 3): Pt will perform supine<>sit with CGA PT Short Term Goal 2 (Week 3): Pt will perform sit<>stand and stand pivot transfers using LRAD with min assist PT Short Term Goal 3 (Week 3): Pt will ambulate at least 546fusing LRAD with min assist PT Short Term Goal 4 (Week 3): Pt will initiate stair training  Skilled Therapeutic Interventions/Progress Updates:  Ambulation/gait training;Balance/vestibular training;Cognitive remediation/compensation;DME/adaptive equipment instruction;Functional mobility training;Splinting/orthotics;Therapeutic Activities;UE/LE Strength taining/ROM;UE/LE Coordination activities;Therapeutic Exercise;Stair training;Patient/family education;Neuromuscular re-education;Disease management/prevention;Discharge planning;Psychosocial support;Wheelchair propulsion/positioning;Pain management  Pt received supine in bed but awake and eyes fully open; pt agreeable to therapy session. Donned B LE TEDs and socks max assist for time management. Supine>sitting EOB with CGA for trunk upright. Stand pivot EOB>TIS w/c using UE support on armrest and therapist with min assist for balance.  Transported to/from gym in w/c for energy conservation. Sit<>stands in // bars using UE support on bars to come to stand with min assist for lifting/balance - pt continues to demonstrate posterior lean in stance, cuing to correct.  Gait training forward/backwards in // bars: 1st trial: using B UE support on bars with min assist for balance and pt demonstrating symmetrical and  appropriate step lengths, good R/L lateral weight shifting, and slow walking speed 2nd trial: used B UE support on therapist's shoulders - demonstrated shuffled  steps with poor R/L lateral weight shift, increased postural sway and even slower gait speed resulting in need for increased assistance - using UE support on therapist required increased energy expenditure also 3rd trial: returned to B UE support on // bars and pt demonstrating improving gait mechanics though due to fatigue shorter step lengths (R smaller than L) and decreased weight shifting  4th trial: initiated 2 steps into 4th trial with pt reporting feeling lightheaded - returned to sitting in w/c tilted backwards with feet elevated and vitals assessed: BP 147/74, HR 66bpm, SpO2 100% Once symptoms dissipated performed standing balance task with intermittent UE support on table of grasping clothespins and placing them on basketball goal net with min/mod assist for balance due to posterior lean - max multimodal cuing for increased anterior weight shift. Transported back to room and pt agreeable to remain in TIS w/c until after dinner. Pt's son, Tanya Knight, present upon arrival back to room and therapist updated him on her increased alertness and increased activity tolerance during therapy session. Pt left seated in TIS w/c with needs in reach, seat belt alarm on, and tilted backwards.   Therapy Documentation Precautions:  Precautions Precautions: Fall Precaution Comments: diplopia Restrictions Weight Bearing Restrictions: No  Pain: On way back to room pt noted to start holding the L side of her head and reporting headache pain - RN notified and reports recent medication administration.   Therapy/Group: Individual Therapy  Tanya Knight, PT, DPT 06/30/2019, 3:59 PM

## 2019-06-30 NOTE — Progress Notes (Signed)
Speech Language Pathology Daily Session Note  Patient Details  Name: MONTSERRATH REINHOLT MRN: [id number] Date of Birth: 10/15/1940  Today's Date: 06/30/2019 SLP Individual Time: 1200-1230 SLP Individual Time Calculation (min): 30 min  Short Term Goals: Week 3: SLP Short Term Goal 1 (Week 3): Pt will consume current diet with minimal overt s/sx aspiration and demonstrate efficient mastication and oral clearance with Supervision A verbal/visual cues. SLP Short Term Goal 2 (Week 3): Pt will achieve 90% intelligibility at sentence level in 8/10 opportunities with Supervision A cues for use of increased rate of speech and vocal intensity. SLP Short Term Goal 3 (Week 3): Pt will initiate during functional familiar tasks in 80% opportunities with Min A multimodal cues. SLP Short Term Goal 4 (Week 3): Pt will demonstrate emergent awareness evidenced by her ability to detect verbal and/or functional errors during familiar tasks with Min A visual/verbal cues. SLP Short Term Goal 5 (Week 3): Pt will demonstrate basic problem solving during functional and familiar tasks with Min A verbal/visual cues. SLP Short Term Goal 6 (Week 3): Pt will demonstrate OX4 with Supervision A verbal/visual cues for use of external aids.  Skilled Therapeutic Interventions:  Skilled treatment session targeted cognition goals. SLP facilitated session by providing Maximal verbal cues to recall basic information related to orientation information and therapies provided earlier in the day. Pt appeared fatigued during session which might have impeded her ability to recall information. Pt left in bed, bed alarm on and all needs within reach.      Pain    Therapy/Group: Individual Therapy  Jaidin Ugarte 06/30/2019, 2:52 PM

## 2019-06-30 NOTE — Plan of Care (Signed)
  Problem: Consults Goal: RH STROKE PATIENT EDUCATION Description: See Patient Education module for education specifics  Outcome: Progressing   Problem: RH BOWEL ELIMINATION Goal: RH STG MANAGE BOWEL WITH ASSISTANCE Description: STG Manage Bowel with min Assistance. Outcome: Progressing Goal: RH STG MANAGE BOWEL W/MEDICATION W/ASSISTANCE Description: STG Manage Bowel with Medication with min Assistance. Outcome: Progressing   Problem: RH BLADDER ELIMINATION Goal: RH STG MANAGE BLADDER WITH ASSISTANCE Description: STG Manage Bladder With min Assistance Outcome: Progressing   Problem: RH SKIN INTEGRITY Goal: RH STG SKIN FREE OF INFECTION/BREAKDOWN Description: Patients skin will remain free from further infection or breakdown with min assist. Outcome: Progressing Goal: RH STG MAINTAIN SKIN INTEGRITY WITH ASSISTANCE Description: STG Maintain Skin Integrity With min Assistance. Outcome: Progressing Goal: RH STG ABLE TO PERFORM INCISION/WOUND CARE W/ASSISTANCE Description: STG Able To Perform Incision/Wound Care With min/mod Assistance. Outcome: Progressing   Problem: RH SAFETY Goal: RH STG ADHERE TO SAFETY PRECAUTIONS W/ASSISTANCE/DEVICE Description: STG Adhere to Safety Precautions With min Assistance/Device. Outcome: Progressing   Problem: RH KNOWLEDGE DEFICIT Goal: RH STG INCREASE KNOWLEDGE OF HYPERTENSION Description: Patient/caregiver will verbalize understanding of management of HTN including diet, exercise, medications, monitoring, and follow up care with min assist. Outcome: Progressing Goal: RH STG INCREASE KNOWLEGDE OF HYPERLIPIDEMIA Description: Patient/caregiver will verbalize understanding of management of HLD including diet, exercise, medications, monitoring, and follow up care with min assist. Outcome: Progressing Goal: RH STG INCREASE KNOWLEDGE OF STROKE PROPHYLAXIS Description: Patient/caregiver will verbalize understanding of management of stroke prophylaxis  including diet, exercise, medications, monitoring, and follow up care with min assist. Outcome: Progressing

## 2019-06-30 NOTE — Progress Notes (Signed)
[facility] PHYSICAL MEDICINE & REHABILITATION PROGRESS NOTE   Subjective/Complaints:   Alert, sitting balance much improved able to sit crosslegged in the bed  ROS- no CP, SOB, no abd pain Denies heartburn   Objective:   No results found. No results for input(s): WBC, HGB, HCT, PLT in the last 72 hours. No results for input(s): NA, K, CL, CO2, GLUCOSE, BUN, CREATININE, CALCIUM in the last 72 hours.  Intake/Output Summary (Last 24 hours) at 06/30/2019 0937 Last data filed at 06/29/2019 1730 Gross per 24 hour  Intake 180 ml  Output --  Net 180 ml     Physical Exam: Vital Signs Blood pressure 134/82, pulse 79, temperature 97.8 F (36.6 C), temperature source Oral, resp. rate 16, weight 52.8 kg, SpO2 99 %. General: No acute distress,  A little less lethargic, eyes open entire conversation; in bed, fatigued.  Mood and affect are appropriate Heart: Regular rate and rhythm no rubs murmurs or extra sounds Lungs: Clear to auscultation, breathing unlabored, no rales or wheezes Abdomen: Positive bowel sounds, soft nontender to palpation, nondistended Extremities: No clubbing, cyanosis, or edema Skin: No evidence of breakdown, no evidence of rash Neurologic: oriented to person place time (month not day) and situation  motor strength is 4/5 in bilateral deltoid, bicep, tricep, grip, hip flexor, knee extensors, ankle dorsiflexor and plantar flexor Sensory exam normal sensation to light touch and proprioception in bilateral upper and lower extremities Cerebellar exam mild dysmetria on L  finger to nose to finger as well as heel to shin in bilateral upper and lower extremities Musculoskeletal: Full range of motion in all 4 extremities. No joint swelling   Assessment/Plan: 1. Functional deficits secondary to Cerebellar vermis ICH which require 3+ hours per day of interdisciplinary therapy in a comprehensive inpatient rehab setting.  Physiatrist is providing close team supervision and 24  hour management of active medical problems listed below.  Physiatrist and rehab team continue to assess barriers to discharge/monitor patient progress toward functional and medical goals  Care Tool:  Bathing     Body parts bathed by patient: Right arm, Left arm, Chest, Abdomen, Front perineal area, Right upper leg, Left upper leg, Face, Right lower leg, Left lower leg, Buttocks   Body parts bathed by helper: Right lower leg, Left lower leg, Buttocks Body parts n/a: Abdomen, Chest(did not attempt this session)   Bathing assist Assist Level: Minimal Assistance - Patient > 75%     Upper Body Dressing/Undressing Upper body dressing   What is the patient wearing?: Pull over shirt    Upper body assist Assist Level: Supervision/Verbal cueing    Lower Body Dressing/Undressing Lower body dressing      What is the patient wearing?: Pants, Incontinence brief     Lower body assist Assist for lower body dressing: Moderate Assistance - Patient 50 - 74%     Toileting Toileting    Toileting assist Assist for toileting: Maximal Assistance - Patient 25 - 49%     Transfers Chair/bed transfer  Transfers assist  Chair/bed transfer activity did not occur: Safety/medical concerns  Chair/bed transfer assist level: Moderate Assistance - Patient 50 - 74%     Locomotion Ambulation   Ambulation assist   Ambulation activity did not occur: Safety/medical concerns  Assist level: Maximal Assistance - Patient 25 - 49% Assistive device: Walker-rolling Max distance: 10'   Walk 10 feet activity   Assist  Walk 10 feet activity did not occur: Safety/medical concerns  Assist level: Moderate Assistance - Patient -  50 - 74% Assistive device: Other (comment)(B hands on therapist's shoulders, therapist on stool in front of pt)   Walk 50 feet activity   Assist Walk 50 feet with 2 turns activity did not occur: Safety/medical concerns         Walk 150 feet activity   Assist Walk 150  feet activity did not occur: Safety/medical concerns         Walk 10 feet on uneven surface  activity   Assist Walk 10 feet on uneven surfaces activity did not occur: Safety/medical concerns         Wheelchair     Assist Will patient use wheelchair at discharge?: No(Not anticipated; no LT goals set)   Wheelchair activity did not occur: Safety/medical concerns         Wheelchair 50 feet with 2 turns activity    Assist    Wheelchair 50 feet with 2 turns activity did not occur: Safety/medical concerns       Wheelchair 150 feet activity     Assist  Wheelchair 150 feet activity did not occur: Safety/medical concerns       Blood pressure 134/82, pulse 79, temperature 97.8 F (36.6 C), temperature source Oral, resp. rate 16, weight 52.8 kg, SpO2 99 %.    1.  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  Making good progress discussed with the patient's husband  Continue PT, OT, SLP 15/7               -patient may  shower 2.  Antithrombotics: -DVT/anticoagulation: Subcutaneous heparin initiated 06/11/2019             -antiplatelet therapy: N/A 3. Pain Management: Continue Tramadol as needed, Fioricet as needed for headaches 4. Mood: Provide appears more anxious this am, increased diplopia- likely side effect of increased topamax              -antipsychotic agents: N/A 5. Neuropsych: This patient is NOT capable of making decisions on her own behalf. 6. Skin/Wound Care: Continue routine skin checks 7. Fluids/Electrolytes/Nutrition: Routine in and outs with follow-up chemistries 8.  Hypertension.  Norvasc 10 mg daily, Toprol-XL 50 mg daily.  Monitor with increased mobility- monitor closely.    Vitals:   06/30/19 0336 06/30/19 0824  BP: (!) 144/80 134/82  Pulse: 75 79  Resp: 16   Temp: 97.8 F (36.6 C)   SpO2: 99%      2/18: well controlled.  9.  History of seizure disorder.  Continue Depakote 500 mg every 12 hours For old seizure d/o as well as prevention of new seizures 10. Diplopia- suggest eye patch intermittently to help with double vision 11. Hypokalemia- last K+ 2.6- will supplement with K CL IV riders  will monitor, also with mild hyponatremia , not on diuretics   2/6- gave KCl 40 mEq x3 in 24 hours and also 10 mEq hourly by IVFs  E lyte abnormaility, probable SIADH , low K+, Mg+++ and Na+ improved Nephro signed off    2/15: K+ is stable at 3.8. Magnesium continues to trend upward; currently 1.9.    BMP Latest Ref Rng & Units 06/27/2019 06/24/2019 06/22/2019  Glucose 70 - 99 mg/dL 105(H) 97 103(H)  BUN 8 - 23 mg/dL 31(H) 22 13  Creatinine 0.44 - 1.00 mg/dL 0.66 0.62 0.44  Sodium 135 - 145 mmol/L 138 133(L) 133(L)  Potassium 3.5 - 5.1  mmol/L 3.9 3.8 3.8  Chloride 98 - 111 mmol/L 107 99 104  CO2 22 - 32 mmol/L 20(L) 22 18(L)  Calcium 8.9 - 10.3 mg/dL 9.1 9.4 8.9    12. Vascular HA, persists despite topamax increase, will reduce dose to 25mg  pt getting relief with tylenol, cont curren tmanagement  13. Nausea/+/- vomiting- improved intake better meal intake ~50% 14 GERD will change PPI to H2 blocker to help with Mg+++ absorption  14. Dysphagia due to CVA   LOS: 16 days A FACE TO FACE EVALUATION WAS PERFORMED  Charlett Blake 06/30/2019, 9:37 AM

## 2019-07-01 ENCOUNTER — Inpatient Hospital Stay (HOSPITAL_COMMUNITY): Payer: Medicare PPO | Admitting: Speech Pathology

## 2019-07-01 ENCOUNTER — Inpatient Hospital Stay (HOSPITAL_COMMUNITY): Payer: Medicare PPO | Admitting: Physical Therapy

## 2019-07-01 ENCOUNTER — Inpatient Hospital Stay (HOSPITAL_COMMUNITY): Payer: Medicare PPO | Admitting: Occupational Therapy

## 2019-07-01 MED ORDER — TOPIRAMATE 25 MG PO TABS
25.0000 mg | ORAL_TABLET | Freq: Every day | ORAL | Status: DC
  Administered 2019-07-02 – 2019-07-04 (×3): 25 mg via ORAL
  Filled 2019-07-01 (×3): qty 1

## 2019-07-01 NOTE — Progress Notes (Signed)
Physical Therapy Session Note  Patient Details  Name: Tanya Knight MRN: [id number] Date of Birth: December 08, 1940  Today's Date: 07/01/2019 PT Individual Time: [id number] PT Individual Time Calculation (min): 61 min   Short Term Goals: Week 3:  PT Short Term Goal 1 (Week 3): Pt will perform supine<>sit with CGA PT Short Term Goal 2 (Week 3): Pt will perform sit<>stand and stand pivot transfers using LRAD with min assist PT Short Term Goal 3 (Week 3): Pt will ambulate at least 60ft using LRAD with min assist PT Short Term Goal 4 (Week 3): Pt will initiate stair training  Skilled Therapeutic Interventions/Progress Updates:    Pt received supine in bed awake with eyes wide open - MD present for morning assessment. Pt agreeable to therapy session. Therapist donned B LE TED hose and socks max assist for time management. Supine>sit EOB with CGA for steadying. Sitting EOB donned pants with max assist for threading LEs for time management and energy conservation. Sit>stand EOB>no UE support with min/mod assist for balance due to posterior lean. Pulled pants over hips with mod assist for pants and standing balance due to posterior lean. Stand pivot to TIS w/c with min/mod assist for balance.  Transported to/from gym in w/c for energy conservation. Sit<>stands throughout session with cuing for pushing up from chair (not pulling up on AD) and min assist for balance due to posterior lean - cuing for midline orientation. Gait training 44ft using RW with min assist for balance going straight and mod assist for balance and AD management while turning - demonstrating poor AD management too far forward, forward flexed trunk, and increased weightbearing through B UEs although has good B LE step lengths. Gait training 65ft using [place]on [place] walker (back brakes locked for straight steering) to promote increased upright trunk posture with mod assist due to increased assist needed for AD management and pt demonstrating improved  upright posture and continued good LE step lengths. Transitioned back to gait using RW ~4ft with visual demonstration and cuing for improved upright posture and closer AD management with pt demonstrating improvement with frequent cuing - continues to require min assist for straight gait and mod assist for balance when turning (especially turning to sit in w/c) due R/L and posterior leaning/LOB. Initiated dynamic standing balance task of card matching with pt requiring mod assist for balance due to posterior lean and pt frequently holding R eye shut when trying to read cards. Pt reports needing to use bathroom. Transported back to room in w/c. Stand pivot w/c> BSC over toilet using grab bar with min assist for balance and max assist for LB clothing management due to urgency. Pt left seated on BSC over toilet with NT present to assume care of patient.   Pt demonstrating improving activity tolerance now able to participate in more activities during a session with decreased frequency and duration of rest breaks.   Therapy Documentation Precautions:  Precautions Precautions: Fall Precaution Comments: diplopia Restrictions Weight Bearing Restrictions: No  Pain:  RN exiting upon therapist's arrival reporting pt complaining of a headache but that she recently administered medication - no complaints of pain from patient throughout session.   Therapy/Group: Individual Therapy  Tawana Scale, PT, DPT 07/01/2019, 7:59 AM

## 2019-07-01 NOTE — Progress Notes (Signed)
Occupational Therapy Weekly Progress Note  Patient Details  Name: Tanya Knight MRN: [id number] Date of Birth: March 17, 1941  Beginning of progress report period: June 24, 2019 End of progress report period: July 01, 2019  Today's Date: 07/01/2019 OT Individual Time: 8115-7262 OT Individual Time Calculation (min): 72 min    Patient has met 2 of 4 short term goals.  Pt is making steady progress with OT at this time.  She is improving in areas of bathing, dressing, balance, and attention.  She still needs mod instructional cueing to maintain sustained attention to selfcare tasks.  At times if she is not stimulated with activity she will sit and close her eyes, requiring therapist verbal cueing to maintain her attention.  She is able to complete UB bathing with supervision from the shower seat as well as mod assist with LB bathing.  Increased posterior lean is present with all standing, requiring mod assist to maintain.  She is able to complete LB dressing at mod assist level as well with improved dynamic sitting balance noted during all dressing tasks when sitting unsupported.  Mrs. Mesenbrink continues to exhibit some dizziness with resting direction changing horizontal nystagmus noted.  She does not however report any diplopia that was present in her earlier stay.  LUE functional use continues to improve, but she does exhibit some increased ataxia when compared with the RUE.  Decreased memory is noted but it is improving and she has begun using a memory notebook as well.  Feel she is making steady progress with OT at this time and is on target for supervision to contact guard goals with planned discharge on 3/3.  Will continue with CIR level therapy at this time.   Patient continues to demonstrate the following deficits: muscle weakness, impaired timing and sequencing, unbalanced muscle activation, ataxia and decreased coordination, decreased attention, decreased awareness, decreased problem  solving, decreased safety awareness and decreased memory, central origin and decreased sitting balance, decreased standing balance, decreased postural control and decreased balance strategies and therefore will continue to benefit from skilled OT intervention to enhance overall performance with BADL and Reduce care partner burden.  Patient progressing toward long term goals..  Continue plan of care.  OT Short Term Goals Week 3:  OT Short Term Goal 1 (Week 3): Pt will complete toilet transfers stand pivot with the RW with min assist. OT Short Term Goal 2 (Week 3): Pt will maintain sustained attention for 30 mins with no more than min instructional cueing during ADL tasks. OT Short Term Goal 3 (Week 3): Pt will complete LB dressing sit to stand with min assist. OT Short Term Goal 4 (Week 3): Pt will complete LB bathing sit to stand with min assist.  Skilled Therapeutic Interventions/Progress Updates:    Pt completed supine to sit EOB with supervision.  She then sat EOB and donned her pants and shoes.  She needed mod assist for pulling pants over hips in standing secondary to posterior LOB.  She was able to donn the shoes and tie them with supervision.  She then completed stand pivot transfer to the wheelchair with use of the RW and worked on brushing her teeth with supervision.  Therapist next took her down to the therapy gym where she transferred to the therapy mat with mod assist using the RW.  Increased posterior LB with mod instructional cueing for hand placement on the arm rests during sit to stand.  Once on the mat she worked on [place] coordination with use  of the Box and Blocks Test.  She was able to complete 32 blocks with the left hand in 1 minute and 31 blocks with the right.  She also worked on picking up and placing 1 1/2" pegs in a peg board with 11 pegs on the left in 1 minute and 12 pegs on the right.  Finished session with return to the wheelchair and then back to the room where she was left up  in the wheelchair.  Memory notebook completed with mod questioning cueing from therapist to recall events during session.  Pt left with safety belt in place and call button in reach.    Therapy Documentation Precautions:  Precautions Precautions: Fall Precaution Comments: diplopia Restrictions Weight Bearing Restrictions: No   Pain: Pain Assessment Pain Scale: Faces Pain Score: 0-No pain ADL: See Care Tool Section for some details of selfcare mobility  Therapy/Group: Individual Therapy  Bhavika Schnider OTR/L 07/01/2019, 3:42 PM

## 2019-07-01 NOTE — Progress Notes (Signed)
[place] PHYSICAL MEDICINE & REHABILITATION PROGRESS NOTE   Subjective/Complaints:  Discussed with physical therapy today.  Patient did very well in therapies yesterday   ROS- no CP, SOB, no abd pain Denies heartburn   Objective:   No results found. No results for input(s): WBC, HGB, HCT, PLT in the last 72 hours. No results for input(s): NA, K, CL, CO2, GLUCOSE, BUN, CREATININE, CALCIUM in the last 72 hours.  Intake/Output Summary (Last 24 hours) at 07/01/2019 0818 Last data filed at 06/30/2019 1700 Gross per 24 hour  Intake 660 ml  Output --  Net 660 ml     Physical Exam: Vital Signs Blood pressure (!) 143/84, pulse 60, temperature 98.3 F (36.8 C), temperature source Oral, resp. rate 16, weight 52.8 kg, SpO2 100 %.  General: No acute distress Mood and affect are appropriate Heart: Regular rate and rhythm no rubs murmurs or extra sounds Lungs: Clear to auscultation, breathing unlabored, no rales or wheezes Abdomen: Positive bowel sounds, soft nontender to palpation, nondistended Extremities: No clubbing, cyanosis, or edema Skin: No evidence of breakdown, no evidence of rash Neurologic: Cranial nerves II through XII intact, motor strength is 5/5 in bilateral deltoid, bicep, tricep, grip, hip flexor, knee extensors, ankle dorsiflexor and plantar flexor Sensory exam normal sensation to light touch and proprioception in bilateral upper and lower extremities Saccadic eye movements are large amplitude, exaggerated  Cerebellar exam normal finger to nose to finger  in RIght upper, mild dysmetria LUE Musculoskeletal: Full range of motion in all 4 extremities. No joint swelling  Assessment/Plan: 1. Functional deficits secondary to Cerebellar vermis ICH which require 3+ hours per day of interdisciplinary therapy in a comprehensive inpatient rehab setting.  Physiatrist is providing close team supervision and 24 hour management of active medical problems listed below.  Physiatrist  and rehab team continue to assess barriers to discharge/monitor patient progress toward functional and medical goals  Care Tool:  Bathing     Body parts bathed by patient: Right arm, Left arm, Chest, Abdomen, Front perineal area, Right upper leg, Left upper leg, Face, Right lower leg, Left lower leg, Buttocks   Body parts bathed by helper: Right lower leg, Left lower leg, Buttocks Body parts n/a: Abdomen, Chest(did not attempt this session)   Bathing assist Assist Level: Minimal Assistance - Patient > 75%     Upper Body Dressing/Undressing Upper body dressing   What is the patient wearing?: Pull over shirt    Upper body assist Assist Level: Supervision/Verbal cueing    Lower Body Dressing/Undressing Lower body dressing      What is the patient wearing?: Pants     Lower body assist Assist for lower body dressing: Moderate Assistance - Patient 50 - 74%     Toileting Toileting    Toileting assist Assist for toileting: Maximal Assistance - Patient 25 - 49%     Transfers Chair/bed transfer  Transfers assist  Chair/bed transfer activity did not occur: Safety/medical concerns  Chair/bed transfer assist level: Minimal Assistance - Patient > 75%     Locomotion Ambulation   Ambulation assist   Ambulation activity did not occur: Safety/medical concerns  Assist level: Minimal Assistance - Patient > 75% Assistive device: Parallel bars Max distance: 17ft   Walk 10 feet activity   Assist  Walk 10 feet activity did not occur: Safety/medical concerns  Assist level: Minimal Assistance - Patient > 75% Assistive device: Parallel bars   Walk 50 feet activity   Assist Walk 50 feet with 2  turns activity did not occur: Safety/medical concerns         Walk 150 feet activity   Assist Walk 150 feet activity did not occur: Safety/medical concerns         Walk 10 feet on uneven surface  activity   Assist Walk 10 feet on uneven surfaces activity did not  occur: Safety/medical concerns         Wheelchair     Assist Will patient use wheelchair at discharge?: No(Not anticipated; no LT goals set)   Wheelchair activity did not occur: Safety/medical concerns         Wheelchair 50 feet with 2 turns activity    Assist    Wheelchair 50 feet with 2 turns activity did not occur: Safety/medical concerns       Wheelchair 150 feet activity     Assist  Wheelchair 150 feet activity did not occur: Safety/medical concerns       Blood pressure (!) 143/84, pulse 60, temperature 98.3 F (36.8 C), temperature source Oral, resp. rate 16, weight 52.8 kg, SpO2 100 %.    1.  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  Making good progress discussed with the patient's husband  Continue PT, OT, SLP 15/7               -patient may  shower 2.  Antithrombotics: -DVT/anticoagulation: Subcutaneous heparin initiated 06/11/2019             -antiplatelet therapy: N/A 3. Pain Management: Continue Tramadol as needed, Fioricet as needed for headaches 4. Mood: Provide appears more anxious this am, increased diplopia- likely side effect of increased topamax              -antipsychotic agents: N/A 5. Neuropsych: This patient is NOT capable of making decisions on her own behalf. 6. Skin/Wound Care: Continue routine skin checks 7. Fluids/Electrolytes/Nutrition: Routine in and outs with follow-up chemistries 8.  Hypertension.  Norvasc 10 mg daily, Toprol-XL 50 mg daily.  Monitor with increased mobility- monitor closely.    Vitals:   06/30/19 1956 07/01/19 0504  BP: (!) 152/81 (!) 143/84  Pulse: 70 60  Resp: 16 16  Temp: 97.9 F (36.6 C) 98.3 F (36.8 C)  SpO2: 100% 100%   2/19 well controlled 9.  History of seizure disorder.  Continue Depakote 500 mg every 12 hours For old seizure d/o as well as  prevention of new seizures 10. Diplopia- suggest eye patch intermittently to help with double vision 11. Hypokalemia- last K+ 2.6- will supplement with K CL IV riders  will monitor, also with mild hyponatremia , not on diuretics   2/6- gave KCl 40 mEq x3 in 24 hours and also 10 mEq hourly by IVFs  E lyte abnormaility, probable SIADH , low K+, Mg+++ and Na+ improved Nephro signed off    2/15: K+ is stable at 3.8. Magnesium continues to trend upward; currently 1.9.   Recheck BMET 2/22 BMP Latest Ref Rng & Units 06/27/2019 06/24/2019 06/22/2019  Glucose 70 - 99 mg/dL 105(H) 97 103(H)  BUN 8 - 23 mg/dL 31(H) 22 13  Creatinine 0.44 - 1.00 mg/dL 0.66 0.62 0.44  Sodium 135 - 145 mmol/L 138 133(L) 133(L)  Potassium 3.5 - 5.1 mmol/L 3.9 3.8 3.8  Chloride 98 - 111 mmol/L 107 99 104  CO2 22 - 32 mmol/L 20(L) 22 18(L)  Calcium 8.9 - 10.3  mg/dL 9.1 9.4 8.9    12. Vascular HA, persists despite topamax increase, will reduce dose to 25mg  pt getting relief with tylenol, will change topamax to qhs and monitor HA frequency 13. Nausea/+/- vomiting- improved intake better meal intake ~50% 14 GERD will change PPI to H2 blocker to help with Mg+++ absorption  14. Dysphagia due to CVA   LOS: 17 days A FACE TO FACE EVALUATION WAS PERFORMED  Charlett Blake 07/01/2019, 8:18 AM

## 2019-07-01 NOTE — Progress Notes (Signed)
Speech Language Pathology Daily Session Note  Patient Details  Name: Tanya Knight MRN: [id number] Date of Birth: 03/31/1941  Today's Date: 07/01/2019 SLP Individual Time: [id number] SLP Individual Time Calculation (min): 31 min  Short Term Goals: Week 3: SLP Short Term Goal 1 (Week 3): Pt will consume current diet with minimal overt s/sx aspiration and demonstrate efficient mastication and oral clearance with Supervision A verbal/visual cues. SLP Short Term Goal 2 (Week 3): Pt will achieve 90% intelligibility at sentence level in 8/10 opportunities with Supervision A cues for use of increased rate of speech and vocal intensity. SLP Short Term Goal 3 (Week 3): Pt will initiate during functional familiar tasks in 80% opportunities with Min A multimodal cues. SLP Short Term Goal 4 (Week 3): Pt will demonstrate emergent awareness evidenced by her ability to detect verbal and/or functional errors during familiar tasks with Min A visual/verbal cues. SLP Short Term Goal 5 (Week 3): Pt will demonstrate basic problem solving during functional and familiar tasks with Min A verbal/visual cues. SLP Short Term Goal 6 (Week 3): Pt will demonstrate OX4 with Supervision A verbal/visual cues for use of external aids.  Skilled Therapeutic Interventions: Pt was seen for skilled ST targeting cognitive goals. Per pt request, a new calendar was provided in order for her to track of date. Pt independently oriented to place and situation, oriented to date with Min A verbal and visual cues for accuracy in use of calendar. She also expressed confusion regarding course of hospitalization, therefore SLP provided Mod A cues for organization in order to create a sequential list of events during hospitalization. Given pt's deficits in short term recall and increased insight into deficits/motivation to "get reorganized", SLP started pt a memory notebook to record daily events to assist with day to day recall. Provided Min A  question cues and Mod A for use of schedule, pt recalled events of previous therapy sessions to record in notebook. Pt left laying in bed with alarm set and needs within reach. Continue per current plan of care.       Pain Pain Assessment Pain Scale: 0-10 Pain Score: 7  Pain Type: Chronic pain Pain Location: Head Pain Descriptors / Indicators: Aching Pain Frequency: Intermittent Patients Stated Pain Goal: 7 Pain Intervention(s): Medication (See eMAR) Multiple Pain Sites: No  Therapy/Group: Individual Therapy  Arbutus Leas 07/01/2019, 11:26 AM

## 2019-07-02 ENCOUNTER — Inpatient Hospital Stay (HOSPITAL_COMMUNITY): Payer: Medicare PPO

## 2019-07-02 ENCOUNTER — Inpatient Hospital Stay (HOSPITAL_COMMUNITY): Payer: Medicare PPO | Admitting: Occupational Therapy

## 2019-07-02 DIAGNOSIS — I610 Nontraumatic intracerebral hemorrhage in hemisphere, subcortical: Secondary | ICD-10-CM

## 2019-07-02 DIAGNOSIS — G441 Vascular headache, not elsewhere classified: Secondary | ICD-10-CM

## 2019-07-02 DIAGNOSIS — E876 Hypokalemia: Secondary | ICD-10-CM

## 2019-07-02 DIAGNOSIS — R569 Unspecified convulsions: Secondary | ICD-10-CM

## 2019-07-02 DIAGNOSIS — I10 Essential (primary) hypertension: Secondary | ICD-10-CM

## 2019-07-02 DIAGNOSIS — I69391 Dysphagia following cerebral infarction: Secondary | ICD-10-CM

## 2019-07-02 NOTE — Progress Notes (Addendum)
[place] PHYSICAL MEDICINE & REHABILITATION PROGRESS NOTE   Subjective/Complaints: Patient seen sitting up in bed this morning.  She states she slept fairly overnight.  She keeps her right eye closed.  ROS: + Visual disturbance. Denies CP, SOB, N/V/D  Objective:   No results found. No results for input(s): WBC, HGB, HCT, PLT in the last 72 hours. No results for input(s): NA, K, CL, CO2, GLUCOSE, BUN, CREATININE, CALCIUM in the last 72 hours.  Intake/Output Summary (Last 24 hours) at 07/02/2019 1220 Last data filed at 07/02/2019 0810 Gross per 24 hour  Intake 580 ml  Output --  Net 580 ml     Physical Exam: Vital Signs Blood pressure 140/81, pulse 62, temperature 98.7 F (37.1 C), temperature source Oral, resp. rate 19, weight 52.8 kg, SpO2 98 %. Constitutional: No distress . Vital signs reviewed. HENT: Normocephalic.  Atraumatic. Eyes: Prefers to keep right eye closed. No discharge. Cardiovascular: No JVD. Respiratory: Normal effort.  No stridor. GI: Non-distended. Skin: Warm and dry.  Intact. Psych: Normal mood.  Normal behavior. Musc: No edema in extremities.  No tenderness in extremities. Neurologic: Alert Motor: 5/5 throughout   Assessment/Plan: 1. Functional deficits secondary to Cerebellar vermis ICH which require 3+ hours per day of interdisciplinary therapy in a comprehensive inpatient rehab setting.  Physiatrist is providing close team supervision and 24 hour management of active medical problems listed below.  Physiatrist and rehab team continue to assess barriers to discharge/monitor patient progress toward functional and medical goals  Care Tool:  Bathing     Body parts bathed by patient: Right arm, Left arm, Chest, Abdomen, Front perineal area, Right upper leg, Left upper leg, Face, Right lower leg, Left lower leg, Buttocks   Body parts bathed by helper: Right lower leg, Left lower leg, Buttocks Body parts n/a: Abdomen, Chest(did not attempt this  session)   Bathing assist Assist Level: Minimal Assistance - Patient > 75%     Upper Body Dressing/Undressing Upper body dressing   What is the patient wearing?: Pull over shirt    Upper body assist Assist Level: Supervision/Verbal cueing    Lower Body Dressing/Undressing Lower body dressing      What is the patient wearing?: Pants     Lower body assist Assist for lower body dressing: Minimal Assistance - Patient > 75%     Toileting Toileting    Toileting assist Assist for toileting: Maximal Assistance - Patient 25 - 49%     Transfers Chair/bed transfer  Transfers assist  Chair/bed transfer activity did not occur: Safety/medical concerns  Chair/bed transfer assist level: Moderate Assistance - Patient 50 - 74%     Locomotion Ambulation   Ambulation assist   Ambulation activity did not occur: Safety/medical concerns  Assist level: Moderate Assistance - Patient 50 - 74% Assistive device: Walker-rolling Max distance: 67ft   Walk 10 feet activity   Assist  Walk 10 feet activity did not occur: Safety/medical concerns  Assist level: Moderate Assistance - Patient - 50 - 74% Assistive device: Walker-rolling   Walk 50 feet activity   Assist Walk 50 feet with 2 turns activity did not occur: Safety/medical concerns  Assist level: Minimal Assistance - Patient > 75% Assistive device: Walker-rolling    Walk 150 feet activity   Assist Walk 150 feet activity did not occur: Safety/medical concerns         Walk 10 feet on uneven surface  activity   Assist Walk 10 feet on uneven surfaces activity did not occur:  Safety/medical concerns         Wheelchair     Assist Will patient use wheelchair at discharge?: No(Not anticipated; no LT goals set)   Wheelchair activity did not occur: Safety/medical concerns         Wheelchair 50 feet with 2 turns activity    Assist    Wheelchair 50 feet with 2 turns activity did not occur:  Safety/medical concerns       Wheelchair 150 feet activity     Assist  Wheelchair 150 feet activity did not occur: Safety/medical concerns       Blood pressure 140/81, pulse 62, temperature 98.7 F (37.1 C), temperature source Oral, resp. rate 19, weight 52.8 kg, SpO2 98 %.  Medical assessment and plan:  1.  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   Continue CIR  2.  Antithrombotics: -DVT/anticoagulation: Subcutaneous heparin initiated 06/11/2019             -antiplatelet therapy: N/A 3. Pain Management: Continue Tramadol as needed, Fioricet as needed for headaches 4. Mood: In good spirits             -antipsychotic agents: N/A 5. Neuropsych: This patient is NOT capable of making decisions on her own behalf. 6. Skin/Wound Care: Continue routine skin checks 7. Fluids/Electrolytes/Nutrition: Routine in and outs. 8.  Hypertension.  Norvasc 10 mg daily, Toprol-XL 50 mg daily.  Monitor with increased mobility- monitor closely.    Vitals:   07/01/19 2004 07/02/19 0439  BP: 118/62 140/81  Pulse: 71 62  Resp: 18 19  Temp: 98.3 F (36.8 C) 98.7 F (37.1 C)  SpO2: 98% 98%   Controlled on 2/20 9.  History of seizure disorder.  Continue Depakote 500 mg every 12 hours  Valproic acid within normal limits on 2/16 10. Diplopia- suggest eye patch intermittently to help with double vision 11. Hypokalemia  Potassium 3.9 on 2/15, labs ordered for Monday  BMP Latest Ref Rng & Units 06/27/2019 06/24/2019 06/22/2019  Glucose 70 - 99 mg/dL 105(H) 97 103(H)  BUN 8 - 23 mg/dL 31(H) 22 13  Creatinine 0.44 - 1.00 mg/dL 0.66 0.62 0.44  Sodium 135 - 145 mmol/L 138 133(L) 133(L)  Potassium 3.5 - 5.1 mmol/L 3.9 3.8 3.8  Chloride 98 - 111 mmol/L 107 99 104  CO2 22 - 32 mmol/L 20(L) 22 18(L)  Calcium 8.9 - 10.3 mg/dL 9.1 9.4 8.9  12. Vascular  HA:  Topamax reduced to 25mg  due to?  Side effects  Continue Tylenol as needed  Controlled on 2/20 13. Nausea/+/- vomiting-  Improved 14 GERD: Unchanged PPI to H2 blocker to help with Mg+++ absorption  14. Dysphagia due to CVA   D3 thins, advance as tolerated  LOS: 18 days A FACE TO FACE EVALUATION WAS PERFORMED  Erinn Mendosa Lorie Phenix 07/02/2019, 12:20 PM

## 2019-07-02 NOTE — Progress Notes (Signed)
Occupational Therapy Session Note  Patient Details  Name: NAREH MATZKE MRN: [id number] Date of Birth: January 11, 1941  Today's Date: 07/02/2019 OT Individual Time: 1300-1358 OT Individual Time Calculation (min): 58 min    Short Term Goals: Week 1:  OT Short Term Goal 1 (Week 1): Pt will maintain sustained attention for 30 mins with no more than min instructional cueing during ADL tasks. OT Short Term Goal 1 - Progress (Week 1): Not met OT Short Term Goal 2 (Week 1): Pt will complete UB selfcare in sitting with supervision. OT Short Term Goal 2 - Progress (Week 1): Not met OT Short Term Goal 3 (Week 1): Pt will complete LB dressing sit to stand with mod assist. OT Short Term Goal 3 - Progress (Week 1): Not met OT Short Term Goal 4 (Week 1): Pt will complete toilet transfers stand pivot with the RW with min assist. OT Short Term Goal 4 - Progress (Week 1): Not met  Skilled Therapeutic Interventions/Progress Updates:    1:1. Pt recievd in bed reporting feeling off (nystagmus at rest but pt reports just turning head to look at cards with husband). Vitals WNL. Ptt completes stand pivot transfers throughout session with MIN A and posterior lean with bed rail and grab bar. tp able to bathe with A only to maintain balance standing with grab bar to wash peri area and buttocks. Pt completes dressing with S for UB and MIN A for LB to don brief, pants and non skid socks. Pt applies lotion to LEs with set up and grooms with set up at sink. Exited session with pt seated in bed, exit alarm on and call light in reach Therapy Documentation Precautions:  Precautions Precautions: Fall Precaution Comments: diplopia Restrictions Weight Bearing Restrictions: No General:   Vital Signs:   Pain:   ADL: ADL Grooming: Moderate assistance(secondary to lethargy) Where Assessed-Grooming: Edge of bed Upper Body Bathing: Moderate assistance Where Assessed-Upper Body Bathing: Edge of bed Lower Body Bathing:  Dependent Where Assessed-Lower Body Bathing: Edge of bed Upper Body Dressing: Dependent Where Assessed-Upper Body Dressing: Edge of bed Lower Body Dressing: Dependent Where Assessed-Lower Body Dressing: Edge of bed Toileting: Dependent Where Assessed-Toileting: Bedside Commode Vision   Perception    Praxis   Exercises:   Other Treatments:     Therapy/Group: Individual Therapy  Tonny Branch 07/02/2019, 1:32 PM

## 2019-07-03 ENCOUNTER — Inpatient Hospital Stay (HOSPITAL_COMMUNITY): Payer: Medicare PPO | Admitting: Physical Therapy

## 2019-07-03 ENCOUNTER — Inpatient Hospital Stay (HOSPITAL_COMMUNITY): Payer: Medicare PPO | Admitting: Speech Pathology

## 2019-07-03 ENCOUNTER — Inpatient Hospital Stay (HOSPITAL_COMMUNITY): Payer: Medicare PPO

## 2019-07-03 DIAGNOSIS — R799 Abnormal finding of blood chemistry, unspecified: Secondary | ICD-10-CM

## 2019-07-03 MED ORDER — ONDANSETRON HCL 4 MG PO TABS
4.0000 mg | ORAL_TABLET | Freq: Four times a day (QID) | ORAL | Status: DC | PRN
  Administered 2019-07-03 – 2019-07-13 (×9): 4 mg via ORAL
  Filled 2019-07-03 (×9): qty 1

## 2019-07-03 NOTE — Progress Notes (Signed)
[facility] PHYSICAL MEDICINE & REHABILITATION PROGRESS NOTE   Subjective/Complaints: Patient seen laying in bed this morning.  She states she slept well overnight.  No reported issues overnight.  ROS: Denies CP, SOB, N/V/D,?  Reliability  Objective:   No results found. No results for input(s): WBC, HGB, HCT, PLT in the last 72 hours. No results for input(s): NA, K, CL, CO2, GLUCOSE, BUN, CREATININE, CALCIUM in the last 72 hours.  Intake/Output Summary (Last 24 hours) at 07/03/2019 1252 Last data filed at 07/03/2019 1230 Gross per 24 hour  Intake 420 ml  Output -  Net 420 ml     Physical Exam: Vital Signs Blood pressure 114/76, pulse 67, temperature 98.2 F (36.8 C), resp. rate 18, weight 52.8 kg, SpO2 99 %.  Constitutional: No distress . Vital signs reviewed. HENT: Normocephalic.  Atraumatic. Eyes: EOMI, tends to keep right eye closed. No discharge. Cardiovascular: No JVD. Respiratory: Normal effort.  No stridor. GI: Non-distended. Skin: Warm and dry.  Intact. Psych: Pleasantly confused. Musc: No edema in extremities.  No tenderness in extremities. Neurologic: Alert and oriented to person and place Motor: 5/5 throughout, unchanged  Assessment/Plan: 1. Functional deficits secondary to Cerebellar vermis ICH which require 3+ hours per day of interdisciplinary therapy in a comprehensive inpatient rehab setting.  Physiatrist is providing close team supervision and 24 hour management of active medical problems listed below.  Physiatrist and rehab team continue to assess barriers to discharge/monitor patient progress toward functional and medical goals  Care Tool:  Bathing     Body parts bathed by patient: Right arm, Left arm, Chest, Abdomen, Front perineal area, Right upper leg, Left upper leg, Face, Right lower leg, Left lower leg, Buttocks   Body parts bathed by helper: Right lower leg, Left lower leg, Buttocks Body parts n/a: Abdomen, Chest(did not attempt this  session)   Bathing assist Assist Level: Minimal Assistance - Patient > 75%     Upper Body Dressing/Undressing Upper body dressing   What is the patient wearing?: Pull over shirt    Upper body assist Assist Level: Supervision/Verbal cueing    Lower Body Dressing/Undressing Lower body dressing      What is the patient wearing?: Pants     Lower body assist Assist for lower body dressing: Minimal Assistance - Patient > 75%     Toileting Toileting    Toileting assist Assist for toileting: Maximal Assistance - Patient 25 - 49%     Transfers Chair/bed transfer  Transfers assist  Chair/bed transfer activity did not occur: Safety/medical concerns  Chair/bed transfer assist level: Minimal Assistance - Patient > 75%     Locomotion Ambulation   Ambulation assist   Ambulation activity did not occur: Safety/medical concerns  Assist level: Moderate Assistance - Patient 50 - 74% Assistive device: Walker-rolling Max distance: 74ft   Walk 10 feet activity   Assist  Walk 10 feet activity did not occur: Safety/medical concerns  Assist level: Moderate Assistance - Patient - 50 - 74% Assistive device: Walker-rolling   Walk 50 feet activity   Assist Walk 50 feet with 2 turns activity did not occur: Safety/medical concerns  Assist level: Minimal Assistance - Patient > 75% Assistive device: Walker-rolling    Walk 150 feet activity   Assist Walk 150 feet activity did not occur: Safety/medical concerns         Walk 10 feet on uneven surface  activity   Assist Walk 10 feet on uneven surfaces activity did not occur: Safety/medical concerns  Wheelchair     Assist Will patient use wheelchair at discharge?: No(Not anticipated; no LT goals set)   Wheelchair activity did not occur: Safety/medical concerns         Wheelchair 50 feet with 2 turns activity    Assist    Wheelchair 50 feet with 2 turns activity did not occur: Safety/medical  concerns       Wheelchair 150 feet activity     Assist  Wheelchair 150 feet activity did not occur: Safety/medical concerns       Blood pressure 114/76, pulse 67, temperature 98.2 F (36.8 C), resp. rate 18, weight 52.8 kg, SpO2 99 %.  Medical assessment and plan:  1.  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   Continue CIR  2.  Antithrombotics: -DVT/anticoagulation: Subcutaneous heparin initiated 06/11/2019             -antiplatelet therapy: N/A 3. Pain Management: Continue Tramadol as needed, Fioricet as needed for headaches 4. Mood: In good spirits             -antipsychotic agents: N/A 5. Neuropsych: This patient is NOT capable of making decisions on her own behalf. 6. Skin/Wound Care: Continue routine skin checks 7. Fluids/Electrolytes/Nutrition: Routine in and outs. 8.  Hypertension.  Norvasc 10 mg daily, Toprol-XL 50 mg daily.  Monitor with increased mobility- monitor closely.    Vitals:   07/02/19 1429 07/02/19 2024  BP: 133/76 114/76  Pulse: 68 67  Resp: 16 18  Temp: 97.6 F (36.4 C) 98.2 F (36.8 C)  SpO2: 99% 99%   Controlled on 2/21 9.  History of seizure disorder.  Continue Depakote 500 mg every 12 hours  Valproic acid within normal limits on 2/16 10. Diplopia- suggest eye patch intermittently to help with double vision 11. Hypokalemia  Potassium 3.9 on 2/15, labs ordered for tomorrow  BMP Latest Ref Rng & Units 06/27/2019 06/24/2019 06/22/2019  Glucose 70 - 99 mg/dL 105(H) 97 103(H)  BUN 8 - 23 mg/dL 31(H) 22 13  Creatinine 0.44 - 1.00 mg/dL 0.66 0.62 0.44  Sodium 135 - 145 mmol/L 138 133(L) 133(L)  Potassium 3.5 - 5.1 mmol/L 3.9 3.8 3.8  Chloride 98 - 111 mmol/L 107 99 104  CO2 22 - 32 mmol/L 20(L) 22 18(L)  Calcium 8.9 - 10.3 mg/dL 9.1 9.4 8.9  12. Vascular HA:  Topamax reduced to 25mg  due to?  Side  effects  Continue Tylenol as needed  Controlled on 2/21  13. Nausea/+/- vomiting-  Improved 14 GERD: Unchanged PPI to H2 blocker to help with Mg+++ absorption  14. Dysphagia due to CVA   D3 thins, advance as tolerated 15.  Elevated BUN  Trending up on 2/15, labs ordered for tomorrow  Encourage fluids  LOS: 19 days A FACE TO FACE EVALUATION WAS PERFORMED  Tanya Knight Lorie Phenix 07/03/2019, 12:52 PM

## 2019-07-03 NOTE — Progress Notes (Signed)
Occupational Therapy Session Note  Patient Details  Name: Tanya Knight MRN: [id number] Date of Birth: Nov 13, 1940  Today's Date: 07/03/2019 OT Individual Time: 1300-1345 OT Individual Time Calculation (min): 45 min    Short Term Goals: Week 3:  OT Short Term Goal 1 (Week 3): Pt will complete toilet transfers stand pivot with the RW with min assist. OT Short Term Goal 2 (Week 3): Pt will maintain sustained attention for 30 mins with no more than min instructional cueing during ADL tasks. OT Short Term Goal 3 (Week 3): Pt will complete LB dressing sit to stand with min assist. OT Short Term Goal 4 (Week 3): Pt will complete LB bathing sit to stand with min assist.  Skilled Therapeutic Interventions/Progress Updates:    Pt received supine with son present, reporting CLOF. Pt c/o HA and discussed impact of visual disturbances on HA/balance. Pt completed bed mobility to EOB with CGA. Pt used RW to complete ambulatory transfer into the bathroom with min A overall. Very poor sequencing/safety awareness when turning to sit on toilet with mod posterior lean. Pt requiring mod A overall for toileting tasks, esp clothing management. Pt returned to the TIS w/c with HHA, mod A overall. She completed oral care at the sink with (S). Pt was taken down to the therapy gym via w/c for time management. Pt completed stand pivot transfer to the mat with RW with min A overall. Pt completed seated level BUE coordination activity of throwing/catching ball at dynamic surface. Activity graded up for pt to complete in standing with min balance support provided. Pt able to complete ~10x consecutive throws with no reports of HA increase and 2x dropping ball. With reduction of posterior leg support on mat pt's posterior lean increased and she required mod A for balance support. Pt was returned to her room via [place]. Pt was left sitting up in the TIS with her son present, all needs met.   Therapy Documentation Precautions:   Precautions Precautions: Fall Precaution Comments: diplopia Restrictions Weight Bearing Restrictions: No  Therapy/Group: Individual Therapy  Curtis Sites 07/03/2019, 7:26 AM

## 2019-07-03 NOTE — Progress Notes (Signed)
Speech Language Pathology Daily Session Note  Patient Details  Name: RAEA GRATZER MRN: [id number] Date of Birth: 11-Dec-1940  Today's Date: 07/03/2019 SLP Individual Time: 1119-1200 SLP Individual Time Calculation (min): 41 min  Short Term Goals: Week 3: SLP Short Term Goal 1 (Week 3): Pt will consume current diet with minimal overt s/sx aspiration and demonstrate efficient mastication and oral clearance with Supervision A verbal/visual cues. SLP Short Term Goal 2 (Week 3): Pt will achieve 90% intelligibility at sentence level in 8/10 opportunities with Supervision A cues for use of increased rate of speech and vocal intensity. SLP Short Term Goal 3 (Week 3): Pt will initiate during functional familiar tasks in 80% opportunities with Min A multimodal cues. SLP Short Term Goal 4 (Week 3): Pt will demonstrate emergent awareness evidenced by her ability to detect verbal and/or functional errors during familiar tasks with Min A visual/verbal cues. SLP Short Term Goal 5 (Week 3): Pt will demonstrate basic problem solving during functional and familiar tasks with Min A verbal/visual cues. SLP Short Term Goal 6 (Week 3): Pt will demonstrate OX4 with Supervision A verbal/visual cues for use of external aids.  Skilled Therapeutic Interventions:  Pt was seen for skilled ST targeting cognitive goals.  SLP facilitated the session with a newspaper reading task to address memory goals.  Pt complained of visual disturbances which made reading the newspaper difficult; therefore, therapist read articles for pt.  When article was chunked into short sections, pt was able to recall 1-3 details from each section with min-mod assist verbal cues.  This task was very engaging for pt initially as she stated that she was an avid reader prior to admission; however, pt quickly became cognitively fatigued as task progressed and she eventually needed up to max assist for recall of details.  SLP recorded activity into memory  notebook and left pt in bed with bed alarm set and call bell within reach.  Continue per current plan of care.    Pain Pain Assessment Pain Scale: 0-10 Pain Score: 0-No pain  Therapy/Group: Individual Therapy  Bralyn Folkert, Selinda Orion 07/03/2019, 12:20 PM

## 2019-07-03 NOTE — Progress Notes (Signed)
Physical Therapy Session Note  Patient Details  Name: Tanya Knight MRN: [id number] Date of Birth: 25-Jun-1940  Today's Date: 07/03/2019 PT Individual Time: 0803-0827 PT Individual Time Calculation (min): 24 min   And Today's Date: 07/03/2019 PT Missed Time: 21 Minutes Missed Time Reason: Unavailable (Comment)(pt requesting to eat prior to exercising in therapy)  Short Term Goals: Week 3:  PT Short Term Goal 1 (Week 3): Pt will perform supine<>sit with CGA PT Short Term Goal 2 (Week 3): Pt will perform sit<>stand and stand pivot transfers using LRAD with min assist PT Short Term Goal 3 (Week 3): Pt will ambulate at least 30ft using LRAD with min assist PT Short Term Goal 4 (Week 3): Pt will initiate stair training  Skilled Therapeutic Interventions/Progress Updates:   Pt received supine in bed awake and alert - agreeable to therapy session. Reports this morning when Dr. Posey Pronto performed morning assessment she was so drowsy that she couldn't recall the current [place] President - she said "I know it is Aeronautical engineer...how stupid.Marland Kitchenthe more I tried to remember the harder it became." Pt reports she hasn't had breakfast and is very hungry. Therapist notified RN. Pt required increased time to initiate mobility today with pt initially sitting up in the bed to "stretch" and performed butterfly stretch in bed. Therapist donned B LE TEDs and socks max assist and educated pt on purpose of them. Performed supine>sitting EOB, HOB partially elevated, with close supervision. Stand pivot EOB>w/c with min assist for balance due to posterior lean. Once in chair pt deferring further mobility until after eating breakfast despite encouragement. Therapist reoriented pt to date and assisted with writing in her memory notebook. Pt left seated in TIS w/c with needs in reach, seat belt alarm on, breakfast tray set-up and NT present to assume care of patient. Missed 21 minutes of skilled physical therapy.  Therapy  Documentation Precautions:  Precautions Precautions: Fall Precaution Comments: diplopia Restrictions Weight Bearing Restrictions: No  Pain: Once up in TIS w/c reported headache pain, pt reports having received her morning medications and nursing staff notified of the pain.   Therapy/Group: Individual Therapy  Tawana Scale, PT, DPT 07/03/2019, 7:50 AM

## 2019-07-04 ENCOUNTER — Other Ambulatory Visit: Payer: Self-pay

## 2019-07-04 ENCOUNTER — Inpatient Hospital Stay (HOSPITAL_COMMUNITY): Payer: Medicare PPO

## 2019-07-04 ENCOUNTER — Inpatient Hospital Stay (HOSPITAL_COMMUNITY): Payer: Medicare PPO | Admitting: Occupational Therapy

## 2019-07-04 ENCOUNTER — Inpatient Hospital Stay (HOSPITAL_COMMUNITY): Payer: Medicare PPO | Admitting: Speech Pathology

## 2019-07-04 LAB — COMPREHENSIVE METABOLIC PANEL
ALT: 18 U/L (ref 0–44)
AST: 19 U/L (ref 15–41)
Albumin: 3.3 g/dL — ABNORMAL LOW (ref 3.5–5.0)
Alkaline Phosphatase: 45 U/L (ref 38–126)
Anion gap: 12 (ref 5–15)
BUN: 24 mg/dL — ABNORMAL HIGH (ref 8–23)
CO2: 25 mmol/L (ref 22–32)
Calcium: 9.6 mg/dL (ref 8.9–10.3)
Chloride: 104 mmol/L (ref 98–111)
Creatinine, Ser: 0.79 mg/dL (ref 0.44–1.00)
GFR calc Af Amer: >60 mL/min (ref >60)
GFR calc non Af Amer: >60 mL/min (ref >60)
Glucose, Bld: 104 mg/dL — ABNORMAL HIGH (ref 70–99)
Potassium: 3.8 mmol/L (ref 3.5–5.1)
Sodium: 141 mmol/L (ref 135–145)
Total Bilirubin: 0.4 mg/dL (ref 0.3–1.2)
Total Protein: 5.7 g/dL — ABNORMAL LOW (ref 6.5–8.1)

## 2019-07-04 LAB — CBC WITH DIFFERENTIAL/PLATELET
Abs Immature Granulocytes: 0.04 10*3/uL (ref 0.00–0.07)
Basophils Absolute: 0.1 10*3/uL (ref 0.0–0.1)
Basophils Relative: 1 %
Eosinophils Absolute: 0.2 10*3/uL (ref 0.0–0.5)
Eosinophils Relative: 4 %
HCT: 40.7 % (ref 36.0–46.0)
Hemoglobin: 13.8 g/dL (ref 12.0–15.0)
Immature Granulocytes: 1 %
Lymphocytes Relative: 33 %
Lymphs Abs: 1.7 10*3/uL (ref 0.7–4.0)
MCH: 30.3 pg (ref 26.0–34.0)
MCHC: 33.9 g/dL (ref 30.0–36.0)
MCV: 89.5 fL (ref 80.0–100.0)
Monocytes Absolute: 0.5 10*3/uL (ref 0.1–1.0)
Monocytes Relative: 10 %
Neutro Abs: 2.6 10*3/uL (ref 1.7–7.7)
Neutrophils Relative %: 51 %
Platelets: 287 10*3/uL (ref 150–400)
RBC: 4.55 MIL/uL (ref 3.87–5.11)
RDW: 14.6 % (ref 11.5–15.5)
WBC: 5.1 10*3/uL (ref 4.0–10.5)
nRBC: 0 % (ref 0.0–0.2)

## 2019-07-04 LAB — MAGNESIUM: Magnesium: 2 mg/dL (ref 1.7–2.4)

## 2019-07-04 NOTE — Progress Notes (Signed)
Occupational Therapy Session Note  Patient Details  Name: Tanya Knight MRN: [id number] Date of Birth: 1941/01/12  Today's Date: 07/04/2019 OT Individual Time: 0802-0900 OT Individual Time Calculation (min): 58 min    Short Term Goals: Week 3:  OT Short Term Goal 1 (Week 3): Pt will complete toilet transfers stand pivot with the RW with min assist. OT Short Term Goal 2 (Week 3): Pt will maintain sustained attention for 30 mins with no more than min instructional cueing during ADL tasks. OT Short Term Goal 3 (Week 3): Pt will complete LB dressing sit to stand with min assist. OT Short Term Goal 4 (Week 3): Pt will complete LB bathing sit to stand with min assist.  Skilled Therapeutic Interventions/Progress Updates:    Pt completed shower and dressing to start session.  Supervision for supine to sit EOB with mod assist for ambulation to the shower bench with use of the RW.  Frequent LOB to both the right, left, and occasionally the posteriorly during mobility.  Mod instructional cueing for staying closer to the walker and for reducing step length.  She completed bathing sit to stand with min assist using the grab bars for support in standing.  Dressing was completed sit to stand from the EOB after transfer out with use of the RW and mod assist.  She completed UB dressing in sitting with supervision, LB dressing at min assist sit to stand overall.  She finished session with stand pivot transfer to the wheelchair with min assist using the RW for support, where she worked on eating her breakfast.  Pt left with call button and phone in reach and safety belt in place.  Pt continues to demonstrate greater sustained attention to tasks.  She still does exhibit resting horizontal nystagmus as well as catch up saccades with visual scanning.    Therapy Documentation Precautions:  Precautions Precautions: Fall Precaution Comments: visual nystagmus Restrictions Weight Bearing Restrictions: No    Pain: Pain Assessment Pain Scale: Faces Pain Score: 0-No pain Pain Type: Chronic pain Pain Location: Head Pain Descriptors / Indicators: Aching Pain Frequency: Intermittent Pain Onset: On-going ADL: See Care Tool Section for some details of mobility and selfcare  Therapy/Group: Individual Therapy  Emil Weigold OTR/L 07/04/2019, 9:00 AM

## 2019-07-04 NOTE — Progress Notes (Signed)
Physical Therapy Session Note  Patient Details  Name: Tanya Knight MRN: [id number] Date of Birth: 1940/09/23  Today's Date: 07/04/2019 PT Individual Time: 1305-1405 PT Individual Time Calculation (min): 60 min    Short Term Goals: Week 3:  PT Short Term Goal 1 (Week 3): Pt will perform supine<>sit with CGA PT Short Term Goal 2 (Week 3): Pt will perform sit<>stand and stand pivot transfers using LRAD with min assist PT Short Term Goal 3 (Week 3): Pt will ambulate at least 72ft using LRAD with min assist PT Short Term Goal 4 (Week 3): Pt will initiate stair training  Skilled Therapeutic Interventions/Progress Updates:    Patient seated in recliner upon PT arrival, agreeable to therapy tx, reports 8/10 HA. Pt appeared significantly more alert during today's session and was interactive with therapist throughout session. She stated that her husband and son can both help her at home - her husband intermittently uses a cane when he wants to but typically only uses it in the community for feelings of security. All sit <> standing at RW transfers throughout session required CGA-minA and cues for sequencing, hand placement, and trunk upright. Pt performed sit > stand and ambulated 5' with RW to TIS with minA for RW management and cues for position in RW. Pt transported to rehab gym in w/c for energy conservation. Pt reported seeing triple out of L eye and double out of R eye but states the eye patch does not help.   Therapist assessed the following:  - (+) VOR  - (+) VOR cancellation  - abnormal eye tracking in all planes; presents with saccadic movements  - absent smooth pursuits   Pt began holding head and reported feeling nauseas saying that focusing on a target and the eye tracking makes her head hurt. Therapist had retrieved emesis bag, however, pt did not require use of bag throughout session. Pt ambulated 28' + 45' + 35' in session with seated rest breaks, CGA-minA for RW management, cues for  upright posture and sequencing. Pt ascended/descended 4 steps with CGA and cues for sequencing. When descending, although pt did not require physical assistance to steady, she demonstrated posterior trunk lean potentially d/t fatigue. Pt transported back to room in w/c. Pt ambulated 5' with RW to recliner with minA for RW management, cues for sequencing and to slow down when turning/backing up to chair as pt started to take short, quick steps.  Pt left in recliner with needs in reach and chair alarm set.   Therapy Documentation Precautions:  Precautions Precautions: Fall Precaution Comments: diplopia Restrictions Weight Bearing Restrictions: No   Therapy/Group: Individual Therapy  Elmyra Ricks Dorthey Depace SPT 07/04/2019, 7:30 AM

## 2019-07-04 NOTE — Progress Notes (Signed)
[facility] PHYSICAL MEDICINE & REHABILITATION PROGRESS NOTE   Subjective/Complaints: Episode of vomiting yesterday , thinks she ate too much and too quickly  ROS: Denies CP, SOB, N/V/D,?  Reliability  Objective:   No results found. Recent Labs    07/04/19 0722  WBC 5.1  HGB 13.8  HCT 40.7  PLT 287   Recent Labs    07/04/19 0722  NA 141  K 3.8  CL 104  CO2 25  GLUCOSE 104*  BUN 24*  CREATININE 0.79  CALCIUM 9.6    Intake/Output Summary (Last 24 hours) at 07/04/2019 0836 Last data filed at 07/03/2019 1839 Gross per 24 hour  Intake 300 ml  Output --  Net 300 ml     Physical Exam: Vital Signs Blood pressure 119/70, pulse 67, temperature 98.3 F (36.8 C), resp. rate 18, weight 52.8 kg, SpO2 99 %.  Constitutional: No distress . Vital signs reviewed. HENT: Normocephalic.  Atraumatic. Eyes: EOMI, tends to keep right eye closed. No discharge. Cardiovascular: No JVD. Respiratory: Normal effort.  No stridor. GI: Non-distended. Skin: Warm and dry.  Intact. Psych: Pleasantly confused. Musc: No edema in extremities.  No tenderness in extremities. Neurologic: Alert and oriented to person and place Motor: 5/5 throughout, unchanged  Assessment/Plan: 1. Functional deficits secondary to Cerebellar vermis ICH which require 3+ hours per day of interdisciplinary therapy in a comprehensive inpatient rehab setting.  Physiatrist is providing close team supervision and 24 hour management of active medical problems listed below.  Physiatrist and rehab team continue to assess barriers to discharge/monitor patient progress toward functional and medical goals  Care Tool:  Bathing     Body parts bathed by patient: Right arm, Left arm, Chest, Abdomen, Front perineal area, Right upper leg, Left upper leg, Face, Right lower leg, Left lower leg, Buttocks   Body parts bathed by helper: Right lower leg, Left lower leg, Buttocks Body parts n/a: Abdomen, Chest(did not attempt this  session)   Bathing assist Assist Level: Minimal Assistance - Patient > 75%     Upper Body Dressing/Undressing Upper body dressing   What is the patient wearing?: Pull over shirt    Upper body assist Assist Level: Supervision/Verbal cueing    Lower Body Dressing/Undressing Lower body dressing      What is the patient wearing?: Pants     Lower body assist Assist for lower body dressing: Minimal Assistance - Patient > 75%     Toileting Toileting    Toileting assist Assist for toileting: Moderate Assistance - Patient 50 - 74%     Transfers Chair/bed transfer  Transfers assist  Chair/bed transfer activity did not occur: Safety/medical concerns  Chair/bed transfer assist level: Minimal Assistance - Patient > 75%     Locomotion Ambulation   Ambulation assist   Ambulation activity did not occur: Safety/medical concerns  Assist level: Moderate Assistance - Patient 50 - 74% Assistive device: Walker-rolling Max distance: 57ft   Walk 10 feet activity   Assist  Walk 10 feet activity did not occur: Safety/medical concerns  Assist level: Moderate Assistance - Patient - 50 - 74% Assistive device: Walker-rolling   Walk 50 feet activity   Assist Walk 50 feet with 2 turns activity did not occur: Safety/medical concerns  Assist level: Minimal Assistance - Patient > 75% Assistive device: Walker-rolling    Walk 150 feet activity   Assist Walk 150 feet activity did not occur: Safety/medical concerns         Walk 10 feet on uneven surface  activity   Assist Walk 10 feet on uneven surfaces activity did not occur: Safety/medical concerns         Wheelchair     Assist Will patient use wheelchair at discharge?: No(Not anticipated; no LT goals set)   Wheelchair activity did not occur: Safety/medical concerns         Wheelchair 50 feet with 2 turns activity    Assist    Wheelchair 50 feet with 2 turns activity did not occur: Safety/medical  concerns       Wheelchair 150 feet activity     Assist  Wheelchair 150 feet activity did not occur: Safety/medical concerns       Blood pressure 119/70, pulse 67, temperature 98.3 F (36.8 C), resp. rate 18, weight 52.8 kg, SpO2 99 %.  Medical assessment and plan:  1.  Truncal ataxia and central vestibular dysfunction secondary to intracranial hemorrhage, cerebellar vermis with SDH and SAH likely related to hypertensive crisis-    Continue CIR PT, OT, SLP  2.  Antithrombotics: -DVT/anticoagulation: Subcutaneous heparin initiated 06/11/2019             -antiplatelet therapy: N/A 3. Pain Management: Continue Tramadol as needed, Fioricet as needed for headaches 4. Mood: In good spirits             -antipsychotic agents: N/A 5. Neuropsych: This patient is NOT capable of making decisions on her own behalf. 6. Skin/Wound Care: Continue routine skin checks 7. Fluids/Electrolytes/Nutrition: Routine in and outs. 8.  Hypertension.  Norvasc 10 mg daily, Toprol-XL 50 mg daily.  Monitor with increased mobility- monitor closely.    Vitals:   07/03/19 1927 07/04/19 0424  BP: 132/78 119/70  Pulse: 72 67  Resp: 18 18  Temp: 98.2 F (36.8 C) 98.3 F (36.8 C)  SpO2: 99% 99%   Controlled on 2/22 9.  History of seizure disorder.  Continue Depakote 500 mg every 12 hours  Valproic acid within normal limits on 2/16 10. Diplopia- suggest eye patch intermittently to help with double vision 11. Hypokalemia  Resolved   BMP Latest Ref Rng & Units 07/04/2019 06/27/2019 06/24/2019  Glucose 70 - 99 mg/dL 104(H) 105(H) 97  BUN 8 - 23 mg/dL 24(H) 31(H) 22  Creatinine 0.44 - 1.00 mg/dL 0.79 0.66 0.62  Sodium 135 - 145 mmol/L 141 138 133(L)  Potassium 3.5 - 5.1 mmol/L 3.8 3.9 3.8  Chloride 98 - 111 mmol/L 104 107 99  CO2 22 - 32 mmol/L 25 20(L) 22  Calcium 8.9 - 10.3 mg/dL 9.6 9.1 9.4  12. Vascular HA:  Topamax will d/c  Continue Tylenol as needed- working well   Controlled on 2/21  13.  Nausea/+/- vomiting-  Improved 14 GERD: Unchanged PPI to H2 blocker to help with Mg+++ absorption  14. Dysphagia due to CVA   D3 thins, advance as tolerated 15.  Elevated BUN  Trending up on 2/15, normalizing with improved intake   Encourage fluids- only ~354ml recorded per day  LOS: 20 days A FACE TO [place] E Jada Fass 07/04/2019, 8:36 AM

## 2019-07-04 NOTE — Progress Notes (Signed)
Speech Language Pathology Daily Session Note  Patient Details  Name: Tanya Knight MRN: [id number] Date of Birth: 09-29-1940  Today's Date: 07/04/2019 SLP Individual Time: 8676-7209 SLP Individual Time Calculation (min): 47 min  Short Term Goals: Week 3: SLP Short Term Goal 1 (Week 3): Pt will consume current diet with minimal overt s/sx aspiration and demonstrate efficient mastication and oral clearance with Supervision A verbal/visual cues. SLP Short Term Goal 2 (Week 3): Pt will achieve 90% intelligibility at sentence level in 8/10 opportunities with Supervision A cues for use of increased rate of speech and vocal intensity. SLP Short Term Goal 3 (Week 3): Pt will initiate during functional familiar tasks in 80% opportunities with Min A multimodal cues. SLP Short Term Goal 4 (Week 3): Pt will demonstrate emergent awareness evidenced by her ability to detect verbal and/or functional errors during familiar tasks with Min A visual/verbal cues. SLP Short Term Goal 5 (Week 3): Pt will demonstrate basic problem solving during functional and familiar tasks with Min A verbal/visual cues. SLP Short Term Goal 6 (Week 3): Pt will demonstrate OX4 with Supervision A verbal/visual cues for use of external aids.  Skilled Therapeutic Interventions: Pt was seen for skilled ST targeting dysphagia and cognitive goals. SLP provided upgraded regular texture snack with thin liquids, which pt consumed with efficient mastication and oral clearance and was Mod I for use of swallow precautions. No overt s/sx aspiration observed with solids or liquids. Given current presentation and vast and consistent improvement in ability to maintain alertness and attention to tasks, recommend upgrade to regular diet, continue thin liquids. Pt also no longer requires full supervision during meals, however set up assistance should be provided. Discussed importance of alertness and positioning for PO intake with pt. During interventions  targeting recall, pt used memory notebook with Min A verbal cues in order to recall information from weekend therapy and course of hospitalization. She independently recalled a question her OT requested she ask this SLP. With use of a calendar she identified today's date with Supervision A verbal cues. During a basic 3-step action card sequencing task, extra time and overall Min A verbal and visual cues were required for problem solving in order to complete task with accuracy. Pt left sitting in recliner with alarm set and call bell at side, needs met to her satisfaction. Continue per current plan of care.       Pain Pain Assessment Pain Scale: 0-10 Pain Score: 0-No pain  Therapy/Group: Individual Therapy  Arbutus Leas 07/04/2019, 12:27 PM

## 2019-07-04 NOTE — Progress Notes (Signed)
Pt called for assistance and there was emesis in lap that resembled what pt had for dinner. Pt stated that she ate too much food at dinner. Pt stated she did not want nausea medicine but later informed this nurse that she was feeling nauseous. Pt only had order for IV Zofran but IV had been removed, this nurse contacted MD, MD changed order to PO. PRN Zofran administered. No other concerns to report. At this time.

## 2019-07-04 NOTE — Progress Notes (Signed)
Physical Therapy Session Note  Patient Details  Name: Tanya Knight MRN: [id number] Date of Birth: [birth year]  Today's Date: 07/04/2019 PT Individual Time: [id number] PT Individual Time Calculation (min): 16 min   Short Term Goals: Week 3:  PT Short Term Goal 1 (Week 3): Pt will perform supine<>sit with CGA PT Short Term Goal 2 (Week 3): Pt will perform sit<>stand and stand pivot transfers using LRAD with min assist PT Short Term Goal 3 (Week 3): Pt will ambulate at least 7ft using LRAD with min assist PT Short Term Goal 4 (Week 3): Pt will initiate stair training  Skilled Therapeutic Interventions/Progress Updates:  Pt seen to make up missed time from previous date.  Pt received lying in recliner & agreeable to tx. Pt transfers sit<>stand x 2 with min assist with posterior lean on initial trial with less noted on 2nd & max cuing for proper hand placement. Attempted gait with RW when pt reports dizziness so assisted back to sitting & orthostatic vitals checked:  Sitting: HR = 66 bpm, BP = 141/83 mmHg (LUE) Standing at 0: HR = 76 bpm, BP = 133/76 mmHg (LUE)  Pt requested to rest 2/2 dizziness & reports "If I had tried to walk farther I would have gotten sick". Pt left in recliner with chair alarm donned, call bell & all needs in reach, BLE elevated.  Therapy Documentation Precautions:  Precautions Precautions: Fall Precaution Comments: visual nystagmus Restrictions Weight Bearing Restrictions: No  Pain: HA, 8/10 - nurse made aware.    Therapy/Group: Individual Therapy  Waunita Schooner 07/04/2019, 11:58 AM

## 2019-07-04 NOTE — Plan of Care (Signed)
  Problem: Consults Goal: RH STROKE PATIENT EDUCATION Description: See Patient Education module for education specifics  Outcome: Progressing   Problem: RH BOWEL ELIMINATION Goal: RH STG MANAGE BOWEL WITH ASSISTANCE Description: STG Manage Bowel with min Assistance. Outcome: Progressing Goal: RH STG MANAGE BOWEL W/MEDICATION W/ASSISTANCE Description: STG Manage Bowel with Medication with min Assistance. Outcome: Progressing   Problem: RH BLADDER ELIMINATION Goal: RH STG MANAGE BLADDER WITH ASSISTANCE Description: STG Manage Bladder With min Assistance Outcome: Progressing   Problem: RH SKIN INTEGRITY Goal: RH STG SKIN FREE OF INFECTION/BREAKDOWN Description: Patients skin will remain free from further infection or breakdown with min assist. Outcome: Progressing Goal: RH STG MAINTAIN SKIN INTEGRITY WITH ASSISTANCE Description: STG Maintain Skin Integrity With min Assistance. Outcome: Progressing Goal: RH STG ABLE TO PERFORM INCISION/WOUND CARE W/ASSISTANCE Description: STG Able To Perform Incision/Wound Care With min/mod Assistance. Outcome: Progressing   Problem: RH SAFETY Goal: RH STG ADHERE TO SAFETY PRECAUTIONS W/ASSISTANCE/DEVICE Description: STG Adhere to Safety Precautions With min Assistance/Device. Outcome: Progressing   Problem: RH KNOWLEDGE DEFICIT Goal: RH STG INCREASE KNOWLEDGE OF HYPERTENSION Description: Patient/caregiver will verbalize understanding of management of HTN including diet, exercise, medications, monitoring, and follow up care with min assist. Outcome: Progressing Goal: RH STG INCREASE KNOWLEGDE OF HYPERLIPIDEMIA Description: Patient/caregiver will verbalize understanding of management of HLD including diet, exercise, medications, monitoring, and follow up care with min assist. Outcome: Progressing Goal: RH STG INCREASE KNOWLEDGE OF STROKE PROPHYLAXIS Description: Patient/caregiver will verbalize understanding of management of stroke prophylaxis  including diet, exercise, medications, monitoring, and follow up care with min assist. Outcome: Progressing

## 2019-07-05 ENCOUNTER — Inpatient Hospital Stay (HOSPITAL_COMMUNITY): Payer: Medicare PPO

## 2019-07-05 ENCOUNTER — Inpatient Hospital Stay (HOSPITAL_COMMUNITY): Payer: Medicare PPO | Admitting: Speech Pathology

## 2019-07-05 ENCOUNTER — Inpatient Hospital Stay (HOSPITAL_COMMUNITY): Payer: Medicare PPO | Admitting: Occupational Therapy

## 2019-07-05 LAB — GLUCOSE, CAPILLARY: Glucose-Capillary: 87 mg/dL (ref 70–99)

## 2019-07-05 NOTE — Progress Notes (Signed)
[facility] PHYSICAL MEDICINE & REHABILITATION PROGRESS NOTE   Subjective/Complaints: No further vomiting reported by nsg, HA controlled by tylenol   ROS: Denies CP, SOB, N/V/D,?  Reliability  Objective:   No results found. Recent Labs    07/04/19 0722  WBC 5.1  HGB 13.8  HCT 40.7  PLT 287   Recent Labs    07/04/19 0722  NA 141  K 3.8  CL 104  CO2 25  GLUCOSE 104*  BUN 24*  CREATININE 0.79  CALCIUM 9.6    Intake/Output Summary (Last 24 hours) at 07/05/2019 [id number] Last data filed at 07/05/2019 0730 Gross per 24 hour  Intake 240 ml  Output --  Net 240 ml     Physical Exam: Vital Signs Blood pressure 133/83, pulse 61, temperature 98.2 F (36.8 C), resp. rate 18, height 5\' 1"  (1.549 m), weight 52.8 kg, SpO2 100 %.   General: No acute distress Mood and affect are appropriate Heart: Regular rate and rhythm no rubs murmurs or extra sounds Lungs: Clear to auscultation, breathing unlabored, no rales or wheezes Abdomen: Positive bowel sounds, soft nontender to palpation, nondistended Extremities: No clubbing, cyanosis, or edema Skin: No evidence of breakdown, no evidence of rash Neurologic:eyes with lg amp saccadic pursuit  motor strength is 4/5 in bilateral deltoid, bicep, tricep, grip, hip flexor, knee extensors, ankle dorsiflexor and plantar flexor  Cerebellar exam mild dysmetria Left l finger to nose to finger Musculoskeletal: Full range of motion in all 4 extremities. No joint swelling  Assessment/Plan: 1. Functional deficits secondary to Cerebellar vermis ICH which require 3+ hours per day of interdisciplinary therapy in a comprehensive inpatient rehab setting.  Physiatrist is providing close team supervision and 24 hour management of active medical problems listed below.  Physiatrist and rehab team continue to assess barriers to discharge/monitor patient progress toward functional and medical goals  Care Tool:  Bathing     Body parts bathed by patient:  Right arm, Left arm, Chest, Abdomen, Front perineal area, Right upper leg, Left upper leg, Face, Right lower leg, Left lower leg, Buttocks   Body parts bathed by helper: Right lower leg, Left lower leg, Buttocks Body parts n/a: Abdomen, Chest(did not attempt this session)   Bathing assist Assist Level: Minimal Assistance - Patient > 75%     Upper Body Dressing/Undressing Upper body dressing   What is the patient wearing?: Pull over shirt    Upper body assist Assist Level: Set up assist    Lower Body Dressing/Undressing Lower body dressing      What is the patient wearing?: Pants, Incontinence brief     Lower body assist Assist for lower body dressing: Minimal Assistance - Patient > 75%     Toileting Toileting    Toileting assist Assist for toileting: Moderate Assistance - Patient 50 - 74%     Transfers Chair/bed transfer  Transfers assist  Chair/bed transfer activity did not occur: Safety/medical concerns  Chair/bed transfer assist level: Minimal Assistance - Patient > 75%     Locomotion Ambulation   Ambulation assist   Ambulation activity did not occur: Safety/medical concerns  Assist level: Minimal Assistance - Patient > 75% Assistive device: Walker-rolling Max distance: 45'   Walk 10 feet activity   Assist  Walk 10 feet activity did not occur: Safety/medical concerns  Assist level: Minimal Assistance - Patient > 75% Assistive device: Walker-rolling   Walk 50 feet activity   Assist Walk 50 feet with 2 turns activity did not occur: Safety/medical concerns  Assist level: Minimal Assistance - Patient > 75% Assistive device: Walker-rolling    Walk 150 feet activity   Assist Walk 150 feet activity did not occur: Safety/medical concerns         Walk 10 feet on uneven surface  activity   Assist Walk 10 feet on uneven surfaces activity did not occur: Safety/medical concerns         Wheelchair     Assist Will patient use wheelchair  at discharge?: No(Not anticipated; no LT goals set)   Wheelchair activity did not occur: Safety/medical concerns         Wheelchair 50 feet with 2 turns activity    Assist    Wheelchair 50 feet with 2 turns activity did not occur: Safety/medical concerns       Wheelchair 150 feet activity     Assist  Wheelchair 150 feet activity did not occur: Safety/medical concerns       Blood pressure 133/83, pulse 61, temperature 98.2 F (36.8 C), resp. rate 18, height 5\' 1"  (1.549 m), weight 52.8 kg, SpO2 100 %.  Medical assessment and plan:  1.  Truncal ataxia and central vestibular dysfunction secondary to intracranial hemorrhage, cerebellar vermis with SDH and SAH likely related to hypertensive crisis-    Continue CIR PT, OT, SLP team conf in am  2.  Antithrombotics: -DVT/anticoagulation: Subcutaneous heparin initiated 06/11/2019             -antiplatelet therapy: N/A 3. Pain Management: Continue Tramadol as needed, Fioricet as needed for headaches 4. Mood: In good spirits             -antipsychotic agents: N/A 5. Neuropsych: This patient is NOT capable of making decisions on her own behalf. 6. Skin/Wound Care: Continue routine skin checks 7. Fluids/Electrolytes/Nutrition: Routine in and outs. 8.  Hypertension.  Norvasc 10 mg daily, Toprol-XL 50 mg daily.  Monitor with increased mobility- monitor closely.    Vitals:   07/04/19 2018 07/05/19 0455  BP: (!) 144/91 133/83  Pulse: 67 61  Resp: 18 18  Temp: 97.7 F (36.5 C) 98.2 F (36.8 C)  SpO2: 99% 100%   Controlled on 2/23 9.  History of seizure disorder.  Continue Depakote 500 mg every 12 hours  Valproic acid within normal limits on 2/16 10. Diplopia- suggest eye patch intermittently to help with double vision 11. Hypokalemia  Resolved   BMP Latest Ref Rng & Units 07/04/2019 06/27/2019 06/24/2019  Glucose 70 - 99 mg/dL 104(H) 105(H) 97  BUN 8 - 23 mg/dL 24(H) 31(H) 22  Creatinine 0.44 - 1.00 mg/dL 0.79 0.66 0.62   Sodium 135 - 145 mmol/L 141 138 133(L)  Potassium 3.5 - 5.1 mmol/L 3.8 3.9 3.8  Chloride 98 - 111 mmol/L 104 107 99  CO2 22 - 32 mmol/L 25 20(L) 22  Calcium 8.9 - 10.3 mg/dL 9.6 9.1 9.4  12. Vascular HA:  Topamax will d/c  Continue Tylenol as needed- working well   Controlled on 2/23 13. Nausea/+/- vomiting-  Improved 14 GERD: Unchanged PPI to H2 blocker to help with Mg+++ absorption  14. Dysphagia due to CVA   D3 thins, advance as tolerated 15.  Elevated BUN  Trending up on 2/15, normalizing with improved intake   Encourage fluids- only ~335ml recorded per day  LOS: 21 days A FACE TO [place] E Shulem Mader 07/05/2019, 8:28 AM

## 2019-07-05 NOTE — Progress Notes (Signed)
Physical Therapy Session Note  Patient Details  Name: Tanya Knight MRN: [id number] Date of Birth: 21-Jan-1941  Today's Date: 07/05/2019 PT Individual Time: 1415-1500 PT Individual Time Calculation (min): 45 min   Short Term Goals: Week 3:  PT Short Term Goal 1 (Week 3): Pt will perform supine<>sit with CGA PT Short Term Goal 2 (Week 3): Pt will perform sit<>stand and stand pivot transfers using LRAD with min assist PT Short Term Goal 3 (Week 3): Pt will ambulate at least 109ft using LRAD with min assist PT Short Term Goal 4 (Week 3): Pt will initiate stair training  Skilled Therapeutic Interventions/Progress Updates:    Patient seated in recliner upon PT arrival, agreeable to therapy tx, states having a HA but RN gave medication 'just a little bit ago.' Pt reported needing to go to the bathroom, sit>standing at [facility] with minA for trunk upright. Pt ambulated with RW 15' to bathroom with CGA and cues for position in RW, stand pivot transfer to commode with minA for RW management. Pt tolerated standing balance at RW and attempted to doff pants/brief with unilateral UE support, therapist provided modA for clothing management. Pt continent of bladder and voided at commode. Sit > stand with CGA, pt stood at RW to allow for therapist to perform clothing management totalA for time management. Pt ambulated 10' out of bathroom to sink to wash hands while performing standing balance with CGA. Stand > sitting in w/c with CGA. Pt transported in w/c to day room for time management and energy conservation. Pt participated in treadmill training this session with use of litegait harness system, CGA-minA assist to step on/off treadmill and CGA for standing balance while donning harness.   Gait training as follows: Trial 1- 1:27 min 0.7 mph 88' Trial 2- 1:39 min 0.7 mph 97'  Trial 3- 1:44 min 0.7 mph 100'   Pt demonstrated dec step length RLE throughout trials requiring max cueing and dec hip/knee flexion as she  began to fatigue, therapist provided upright posture cues. Pt reported feeling fatigued at end of session. She performed retro gait/step to get off of the treadmill with minA, standing balance for therapist to doff harness, stand > sit with CGA. Pt transported back to room in w/c. Pt ambulated 10' with RW > stand pivot transfer to recliner with CGA an cues for sequencing and technique with lateral step as pt demonstrated cross over step multiple times. Pt left in recliner with needs in reach and chair alarm set.     Therapy Documentation Precautions:  Precautions Precautions: Fall Precaution Comments: visual nystagmus Restrictions Weight Bearing Restrictions: No    Therapy/Group: Individual Therapy  [name] SPT 07/05/2019, 7:29 AM

## 2019-07-05 NOTE — Plan of Care (Signed)
  Problem: Consults Goal: RH STROKE PATIENT EDUCATION Description: See Patient Education module for education specifics  Outcome: Progressing   Problem: RH BOWEL ELIMINATION Goal: RH STG MANAGE BOWEL WITH ASSISTANCE Description: STG Manage Bowel with min Assistance. Outcome: Progressing Goal: RH STG MANAGE BOWEL W/MEDICATION W/ASSISTANCE Description: STG Manage Bowel with Medication with min Assistance. Outcome: Progressing   Problem: RH BLADDER ELIMINATION Goal: RH STG MANAGE BLADDER WITH ASSISTANCE Description: STG Manage Bladder With min Assistance Outcome: Progressing   Problem: RH SKIN INTEGRITY Goal: RH STG SKIN FREE OF INFECTION/BREAKDOWN Description: Patients skin will remain free from further infection or breakdown with min assist. Outcome: Progressing Goal: RH STG MAINTAIN SKIN INTEGRITY WITH ASSISTANCE Description: STG Maintain Skin Integrity With min Assistance. Outcome: Progressing Goal: RH STG ABLE TO PERFORM INCISION/WOUND CARE W/ASSISTANCE Description: STG Able To Perform Incision/Wound Care With min/mod Assistance. Outcome: Progressing   Problem: RH SAFETY Goal: RH STG ADHERE TO SAFETY PRECAUTIONS W/ASSISTANCE/DEVICE Description: STG Adhere to Safety Precautions With min Assistance/Device. Outcome: Progressing   Problem: RH KNOWLEDGE DEFICIT Goal: RH STG INCREASE KNOWLEDGE OF HYPERTENSION Description: Patient/caregiver will verbalize understanding of management of HTN including diet, exercise, medications, monitoring, and follow up care with min assist. Outcome: Progressing Goal: RH STG INCREASE KNOWLEGDE OF HYPERLIPIDEMIA Description: Patient/caregiver will verbalize understanding of management of HLD including diet, exercise, medications, monitoring, and follow up care with min assist. Outcome: Progressing Goal: RH STG INCREASE KNOWLEDGE OF STROKE PROPHYLAXIS Description: Patient/caregiver will verbalize understanding of management of stroke prophylaxis  including diet, exercise, medications, monitoring, and follow up care with min assist. Outcome: Progressing

## 2019-07-05 NOTE — Progress Notes (Signed)
Occupational Therapy Session Note  Patient Details  Name: Tanya Knight MRN: [id number] Date of Birth: [birth year]  Today's Date: 07/05/2019 OT Individual Time: [id number] OT Individual Time Calculation (min): 56 min    Short Term Goals: Week 3:  OT Short Term Goal 1 (Week 3): Pt will complete toilet transfers stand pivot with the RW with min assist. OT Short Term Goal 2 (Week 3): Pt will maintain sustained attention for 30 mins with no more than min instructional cueing during ADL tasks. OT Short Term Goal 3 (Week 3): Pt will complete LB dressing sit to stand with min assist. OT Short Term Goal 4 (Week 3): Pt will complete LB bathing sit to stand with min assist.  Skilled Therapeutic Interventions/Progress Updates:    Pt pleasant and joyful this am when therapist entered.  She reported feeling good and being ready to get OOB.  Supervision for supine to sit with some increased dizziness noted.  She reported dizziness at rest in the bed at a 2/10 with transition to sitting causing an increase to 8/10.  Still with noted vertical nystagmus during scanning and with midline gaze.  She completed LB dressing with min assist including donning TEDs and shoes.  She then transferred over to the sink with use of the RW to attempt brushing teeth in standing.  Min assist needed for transfer with mod instructional cueing to stay inside of the walker and to take smaller steps.  She was not able to maintain standing for oral hygiene secondary to increased dizziness in standing, rated 10/10 and some increased nausea.  After sitting the nausea improved slightly, but she still remained dizzy per her report at 8/10. It slowly subsided a little, but no number given  She completed oral hygiene in sitting along with brushing her hair.  She was transferred from the tilt in space wheelchair to a standard with min assist and reported dizziness at only a 6/10 with the task.  Gaze fixation during transfers or in sitting did not  decreased symptoms.  She did report improvement with eye closure however.  Finished session with pt in the wheelchair and safety belt in place.  Call button and phone in reach.  Pt was able to recall some things she had completed in her sessions yesterday.    Therapy Documentation Precautions:  Precautions Precautions: Fall Precaution Comments: visual nystagmus Restrictions Weight Bearing Restrictions: No  Pain: Pain Assessment Pain Scale: 0-10 Pain Score: 8  Faces Pain Scale: Hurts a little bit Pain Type: Chronic pain Pain Location: Head Pain Orientation: Right Pain Descriptors / Indicators: Aching Pain Frequency: Intermittent Pain Onset: On-going Patients Stated Pain Goal: 3 Pain Intervention(s): Medication (See eMAR) ADL: See Care Tool Section for some details of mobility and selfcare  Therapy/Group: Individual Therapy  [name] OTR/L 07/05/2019, 9:52 AM

## 2019-07-05 NOTE — Progress Notes (Signed)
Speech Language Pathology Daily Session Note  Patient Details  Name: Tanya Knight MRN: [id number] Date of Birth: [birth year]  Today's Date: 07/05/2019 SLP Individual Time: 1001-1032 SLP Individual Time Calculation (min): 31 min  Short Term Goals: Week 3: SLP Short Term Goal 1 (Week 3): Pt will consume current diet with minimal overt s/sx aspiration and demonstrate efficient mastication and oral clearance with Supervision A verbal/visual cues. SLP Short Term Goal 2 (Week 3): Pt will achieve 90% intelligibility at sentence level in 8/10 opportunities with Supervision A cues for use of increased rate of speech and vocal intensity. SLP Short Term Goal 3 (Week 3): Pt will initiate during functional familiar tasks in 80% opportunities with Min A multimodal cues. SLP Short Term Goal 4 (Week 3): Pt will demonstrate emergent awareness evidenced by her ability to detect verbal and/or functional errors during familiar tasks with Min A visual/verbal cues. SLP Short Term Goal 5 (Week 3): Pt will demonstrate basic problem solving during functional and familiar tasks with Min A verbal/visual cues. SLP Short Term Goal 6 (Week 3): Pt will demonstrate OX4 with Supervision A verbal/visual cues for use of external aids.  Skilled Therapeutic Interventions: Pt was seen for skilled ST targeting cognitive goals. Pt recalled new and daily information with no more than Supervision A question cues and use of memory notebook to assist with recall of details. Pt independently oriented to date and place today. Prior to engagement in basic money management activity from [place], SLP had pt rate anticipated difficulty level involved with sorting and counting money, which pt reporting should be "very easy, I do it every day." Although pt sorted coins Mod I, she required Mod A verbal cues for recall/working memory throughout task, and Min A for error awareness and problem solving in order to make basic calculations in order to derive  a total. Pt had good insight into level of difficulty of this task and expressed desire to continue to work on money management in future sessions, as that is something she enjoyed doing at baseline. Pt left sitting in recliner with alarm set and needs within reach. Continue per current plan of care.       Pain Pain Assessment Pain Scale: 0-10 Pain Score: 0-No pain  Therapy/Group: Individual Therapy  Arbutus Leas 07/05/2019, 12:09 PM

## 2019-07-06 ENCOUNTER — Inpatient Hospital Stay (HOSPITAL_COMMUNITY): Payer: Medicare PPO | Admitting: Physical Therapy

## 2019-07-06 ENCOUNTER — Inpatient Hospital Stay (HOSPITAL_COMMUNITY): Payer: Medicare PPO | Admitting: Speech Pathology

## 2019-07-06 ENCOUNTER — Inpatient Hospital Stay (HOSPITAL_COMMUNITY): Payer: Medicare PPO | Admitting: Occupational Therapy

## 2019-07-06 MED ORDER — MECLIZINE HCL 25 MG PO TABS
12.5000 mg | ORAL_TABLET | Freq: Two times a day (BID) | ORAL | Status: DC | PRN
  Administered 2019-07-07 – 2019-07-11 (×6): 12.5 mg via ORAL
  Filled 2019-07-06 (×6): qty 1

## 2019-07-06 NOTE — Plan of Care (Signed)
  Problem: Consults Goal: RH STROKE PATIENT EDUCATION Description: See Patient Education module for education specifics  Outcome: Progressing   Problem: RH BOWEL ELIMINATION Goal: RH STG MANAGE BOWEL WITH ASSISTANCE Description: STG Manage Bowel with min Assistance. Outcome: Progressing Goal: RH STG MANAGE BOWEL W/MEDICATION W/ASSISTANCE Description: STG Manage Bowel with Medication with min Assistance. Outcome: Progressing   Problem: RH BLADDER ELIMINATION Goal: RH STG MANAGE BLADDER WITH ASSISTANCE Description: STG Manage Bladder With min Assistance Outcome: Progressing   Problem: RH SKIN INTEGRITY Goal: RH STG SKIN FREE OF INFECTION/BREAKDOWN Description: Patients skin will remain free from further infection or breakdown with min assist. Outcome: Progressing Goal: RH STG MAINTAIN SKIN INTEGRITY WITH ASSISTANCE Description: STG Maintain Skin Integrity With min Assistance. Outcome: Progressing Goal: RH STG ABLE TO PERFORM INCISION/WOUND CARE W/ASSISTANCE Description: STG Able To Perform Incision/Wound Care With min/mod Assistance. Outcome: Progressing   Problem: RH SAFETY Goal: RH STG ADHERE TO SAFETY PRECAUTIONS W/ASSISTANCE/DEVICE Description: STG Adhere to Safety Precautions With min Assistance/Device. Outcome: Progressing   Problem: RH KNOWLEDGE DEFICIT Goal: RH STG INCREASE KNOWLEDGE OF HYPERTENSION Description: Patient/caregiver will verbalize understanding of management of HTN including diet, exercise, medications, monitoring, and follow up care with min assist. Outcome: Progressing Goal: RH STG INCREASE KNOWLEGDE OF HYPERLIPIDEMIA Description: Patient/caregiver will verbalize understanding of management of HLD including diet, exercise, medications, monitoring, and follow up care with min assist. Outcome: Progressing Goal: RH STG INCREASE KNOWLEDGE OF STROKE PROPHYLAXIS Description: Patient/caregiver will verbalize understanding of management of stroke prophylaxis  including diet, exercise, medications, monitoring, and follow up care with min assist. Outcome: Progressing

## 2019-07-06 NOTE — Progress Notes (Signed)
Occupational Therapy Session Note  Patient Details  Name: Tanya Knight MRN: [id number] Date of Birth: [birth year]  Today's Date: 07/06/2019 OT Individual Time: [id number] OT Individual Time Calculation (min): 42 min    Short Term Goals: Week 3:  OT Short Term Goal 1 (Week 3): Pt will complete toilet transfers stand pivot with the RW with min assist. OT Short Term Goal 2 (Week 3): Pt will maintain sustained attention for 30 mins with no more than min instructional cueing during ADL tasks. OT Short Term Goal 3 (Week 3): Pt will complete LB dressing sit to stand with min assist. OT Short Term Goal 4 (Week 3): Pt will complete LB bathing sit to stand with min assist.  Skilled Therapeutic Interventions/Progress Updates:    Pt in bed to start session reporting increased dizziness as well as a headache.  Tylenol given earlier per her report.  She states dizziness at 9/10 in sitting and this remained throughout session.  Visual fixation with transfers to the EOB or to the shower did not cause a decrease in her dizziness.  She completed functional mobility to the shower bench with min assist using the RW for support.  She was able to complete all bathing with min assist overall and min instructional cueing for sequencing.  She was able to transfer out to the EOB for dressing tasks with min assist.  She was able to complete UB dressing with supervision and LB dressing with min assist sit to stand.  Finished session with transfer to the bedside recliner with min assist to complete session.  Pt left with call button and phone in reach and safety alarm belt in place.  Nursing made aware of need for some nausea medicine.    Therapy Documentation Precautions:  Precautions Precautions: Fall Precaution Comments: visual nystagmus Restrictions Weight Bearing Restrictions: No  Pain: Pain Assessment Pain Scale: Faces Pain Score: 9  Faces Pain Scale: No hurt Pain Type: Acute pain Pain Location: Head Pain  Orientation: Right Pain Descriptors / Indicators: Discomfort Pain Frequency: Intermittent Pain Onset: With Activity Patients Stated Pain Goal: 3 Pain Intervention(s): Medication (See eMAR);Repositioned ADL: See Care Tool Section for some details of mobility and selfcare  Therapy/Group: Individual Therapy  Reshma Hoey OTR/L 07/06/2019, 10:46 AM

## 2019-07-06 NOTE — Progress Notes (Signed)
[facility] PHYSICAL MEDICINE & REHABILITATION PROGRESS NOTE   Subjective/Complaints:  Complaining about vision, "monovision"  One eye corrected for near and one for far  ROS: Denies CP, SOB, N/V/D,?  Reliability  Objective:   No results found. Recent Labs    07/04/19 0722  WBC 5.1  HGB 13.8  HCT 40.7  PLT 287   Recent Labs    07/04/19 0722  NA 141  K 3.8  CL 104  CO2 25  GLUCOSE 104*  BUN 24*  CREATININE 0.79  CALCIUM 9.6    Intake/Output Summary (Last 24 hours) at 07/06/2019 0853 Last data filed at 07/06/2019 0810 Gross per 24 hour  Intake 340 ml  Output --  Net 340 ml     Physical Exam: Vital Signs Blood pressure 111/89, pulse 67, temperature 97.7 F (36.5 C), resp. rate 17, height 5' 1" (1.549 m), weight 52.8 kg, SpO2 97 %.   General: No acute distress Mood and affect are appropriate Heart: Regular rate and rhythm no rubs murmurs or extra sounds Lungs: Clear to auscultation, breathing unlabored, no rales or wheezes Abdomen: Positive bowel sounds, soft nontender to palpation, nondistended Extremities: No clubbing, cyanosis, or edema Skin: No evidence of breakdown, no evidence of rash Neurologic:eyes with lg amp saccadic pursuit  motor strength is 4/5 in bilateral deltoid, bicep, tricep, grip, hip flexor, knee extensors, ankle dorsiflexor and plantar flexor  Cerebellar exam mild dysmetria Left l finger to nose to finger Musculoskeletal: Full range of motion in all 4 extremities. No joint swelling  Assessment/Plan: 1. Functional deficits secondary to Cerebellar vermis ICH which require 3+ hours per day of interdisciplinary therapy in a comprehensive inpatient rehab setting.  Physiatrist is providing close team supervision and 24 hour management of active medical problems listed below.  Physiatrist and rehab team continue to assess barriers to discharge/monitor patient progress toward functional and medical goals  Care Tool:  Bathing     Body parts  bathed by patient: Right arm, Left arm, Chest, Abdomen, Front perineal area, Right upper leg, Left upper leg, Face, Right lower leg, Left lower leg, Buttocks   Body parts bathed by helper: Right lower leg, Left lower leg, Buttocks Body parts n/a: Abdomen, Chest(did not attempt this session)   Bathing assist Assist Level: Minimal Assistance - Patient > 75%     Upper Body Dressing/Undressing Upper body dressing   What is the patient wearing?: Pull over shirt    Upper body assist Assist Level: Set up assist    Lower Body Dressing/Undressing Lower body dressing      What is the patient wearing?: Pants     Lower body assist Assist for lower body dressing: Minimal Assistance - Patient > 75%     Toileting Toileting    Toileting assist Assist for toileting: Moderate Assistance - Patient 50 - 74%     Transfers Chair/bed transfer  Transfers assist  Chair/bed transfer activity did not occur: Safety/medical concerns  Chair/bed transfer assist level: Minimal Assistance - Patient > 75%     Locomotion Ambulation   Ambulation assist   Ambulation activity did not occur: Safety/medical concerns  Assist level: Minimal Assistance - Patient > 75% Assistive device: Walker-rolling Max distance: 5'   Walk 10 feet activity   Assist  Walk 10 feet activity did not occur: Safety/medical concerns  Assist level: Minimal Assistance - Patient > 75% Assistive device: Walker-rolling   Walk 50 feet activity   Assist Walk 50 feet with 2 turns activity did not occur:  Safety/medical concerns  Assist level: Minimal Assistance - Patient > 75% Assistive device: Walker-rolling    Walk 150 feet activity   Assist Walk 150 feet activity did not occur: Safety/medical concerns         Walk 10 feet on uneven surface  activity   Assist Walk 10 feet on uneven surfaces activity did not occur: Safety/medical concerns         Wheelchair     Assist Will patient use wheelchair  at discharge?: No(Not anticipated; no LT goals set)   Wheelchair activity did not occur: Safety/medical concerns         Wheelchair 50 feet with 2 turns activity    Assist    Wheelchair 50 feet with 2 turns activity did not occur: Safety/medical concerns       Wheelchair 150 feet activity     Assist  Wheelchair 150 feet activity did not occur: Safety/medical concerns       Blood pressure 111/89, pulse 67, temperature 97.7 F (36.5 C), resp. rate 17, height 5' 1" (1.549 m), weight 52.8 kg, SpO2 97 %.  Medical assessment and plan:  1.  Truncal ataxia and central vestibular dysfunction secondary to intracranial hemorrhage, cerebellar vermis with SDH and SAH likely related to hypertensive crisis-    Continue CIR PT, OT, SLP Team conference today please see physician documentation under team conference tab, met with team  to discuss problems,progress, and goals. Formulized individual treatment plan based on medical history, underlying problem and comorbidities. 2.  Antithrombotics: -DVT/anticoagulation: Subcutaneous heparin initiated 06/11/2019             -antiplatelet therapy: N/A 3. Pain Management: Continue Tramadol as needed, Fioricet as needed for headaches 4. Mood: In good spirits             -antipsychotic agents: N/A 5. Neuropsych: This patient is NOT capable of making decisions on her own behalf. 6. Skin/Wound Care: Continue routine skin checks 7. Fluids/Electrolytes/Nutrition: Routine in and outs. 8.  Hypertension.  Norvasc 10 mg daily, Toprol-XL 50 mg daily.  Monitor with increased mobility- monitor closely.    Vitals:   07/06/19 0407 07/06/19 0805  BP: (!) 150/80 111/89  Pulse: 60 67  Resp: 17   Temp: 97.7 F (36.5 C)   SpO2: 97%    Controlled on 2/24 9.  History of seizure disorder.  Continue Depakote 500 mg every 12 hours  Valproic acid within normal limits on 2/16 10. Diplopia- suggest eye patch intermittently to help with double vision 11.  Hypokalemia  Resolved   BMP Latest Ref Rng & Units 07/04/2019 06/27/2019 06/24/2019  Glucose 70 - 99 mg/dL 104(H) 105(H) 97  BUN 8 - 23 mg/dL 24(H) 31(H) 22  Creatinine 0.44 - 1.00 mg/dL 0.79 0.66 0.62  Sodium 135 - 145 mmol/L 141 138 133(L)  Potassium 3.5 - 5.1 mmol/L 3.8 3.9 3.8  Chloride 98 - 111 mmol/L 104 107 99  CO2 22 - 32 mmol/L 25 20(L) 22  Calcium 8.9 - 10.3 mg/dL 9.6 9.1 9.4  12. Vascular HA:  Topamax will d/c  Continue Tylenol as needed- working well   Controlled on 2/23 13. Nausea/+/- vomiting-  Improved 14 GERD: Unchanged PPI to H2 blocker to help with Mg+++ absorption  14. Dysphagia due to CVA   D3 thins, advance as tolerated 15.  Elevated BUN  Trending up on 2/15, normalizing with improved intake   Encourage fluids- only ~385m recorded per day  LOS: 22 days A FACE TO FACE EVALUATION  WAS PERFORMED  Charlett Blake 07/06/2019, 8:53 AM

## 2019-07-06 NOTE — Progress Notes (Signed)
Physical Therapy Session Note  Patient Details  Name: Tanya Knight MRN: [id number] Date of Birth: Nov 16, 1940  Today's Date: 07/06/2019 PT Individual Time: [id number] PT Individual Time Calculation (min): 57 min   Short Term Goals: Week 4:  PT Short Term Goal 1 (Week 4): STG = LTG d/t ELOS  Skilled Therapeutic Interventions/Progress Updates:   Pt in recliner and agreeable to therapy, HA 9/10 and pt reports mild nausea. Made RN who was present to provide pain medication. Sit>stand and ambulated to toilet w/ min assist using RW. Pt performed LE garment management w/o physical assist, toilet transfer w/ min assist and pt continent of void. Sat at sink to wash hands from w/c level. Total assist w/c transport to/from therapy gym. Attempted to work on static standing balance w/o UE support while matching cards. Pt w/ increased frustration w/ task 2/2 vision impairments making scanning for cards difficult, requesting to perform different task. Worked on gait training w/o AD to emphasize balance strategies and lateral weight shifting. Ambulated 58' and 50' w/ min-mod assist for balance. Pt w/ increased forward trunk flexion, verbal cues for upright posture, and poor foot clearance bilaterally. Verbal cues to increase foot clearance and to listen for auditory cue of feet shuffling on floor. CGA-min assist for all sit<>stands from w/c w/o RW in front of her, pt able to verbalize technique and UE placement w/o prompting. Performed NuStep @ level 2, 5 min and 3 min to work on endurance training, global strengthening, and reciprocal movement pattern. Emphasis on large amplitude movements and maintaining a pace of >60 steps/min. Returned to room and ended session in recliner, all needs in reach.   Therapy Documentation Precautions:  Precautions Precautions: Fall Precaution Comments: visual nystagmus Restrictions Weight Bearing Restrictions: No Pain: Pain Assessment Pain Scale: 0-10 Pain Score: 9  Pain  Type: Chronic pain Pain Location: Head Pain Orientation: Right  Therapy/Group: Individual Therapy  [name] Clent Demark 07/06/2019, 3:03 PM

## 2019-07-06 NOTE — Patient Care Conference (Signed)
Inpatient RehabilitationTeam Conference and Plan of Care Update Date: 07/06/2019   Time: 10:40 AM    Patient Name: Tanya Knight      Medical Record Number: [id number]  Date of Birth: Dec 10, 1940 Sex: Female         Room/Bed: 4W21C/4W21C-01 Payor Info: Payor: [employer] MEDICARE / Plan: [employer] MEDICARE CHOICE PPO / Product Type: *No Product type* /    Admit Date/Time:  06/14/2019  3:24 PM  Primary Diagnosis:  ICH (intracerebral hemorrhage) [facility])  Patient Active Problem List   Diagnosis Date Noted  . Elevated BUN   . Dysphagia, post-stroke   . Vascular headache   . Hypokalemia   . Seizures ([place])   . Essential hypertension   . Hyperlipidemia 06/14/2019  . Cerebellar vermis ICH with SDH and SAH, likely hypertensive 06/10/2019  . Hyponatremia 09/08/2014  . Advance directive discussed with patient 09/08/2014  . Routine general medical examination at a health care facility 09/03/2011  . Essential hypertension, benign 10/12/2006  . GERD 10/12/2006  . Osteoporosis 10/12/2006  . Seizure disorder ([place]) 10/12/2006    Expected Discharge Date: Expected Discharge Date: 07/13/19  Team Members Present: Physician leading conference: Dr. Alysia Penna Nurse Present: Dorien Chihuahua, RN;Other (comment)(Susan Truman Hayward, RN) Case Manager: Karene Fry, RN PT Present: Michaelene Song, PT OT Present: Clyda Greener, OT SLP Present: Jettie Booze, CF-SLP PPS Coordinator present : Gunnar Fusi, SLP     Current Status/Progress Goal Weekly Team Focus  Bowel/Bladder   Pt continent of B/B, LBM 2/21  To remain continent  Assess toileting q shift and prn   Swallow/Nutrition/ Hydration   Upgraded regular textures, set up asssit  Supervision A  tolerance upgraded diet and independence with meals   ADL's   supervision for UB selfcare, min assist for LB selfcare sit to stand.  Min assist for transfers stand pivot with min to mod for mobiity into the shower or toilet from EOB with use of the RW for support.   currently contact guard overall at this time  selfcare retraining, balance retraining, transfer training, therapeutic activites, pt/family education, neuromuscular re-education   Mobility   supervision/CGA bed mobility, CGA-min A stand pivot w/o AD, CGA-minA gait up to 45' with RW, CGA 4 steps B rails  supervision overall, CGA stairs  dynamic standing balance, trunk control, gait, treadmill training, weight shifting   Communication   100% intelligible  Supervision A  goals met for speech intelligibility   Safety/Cognition/ Behavioral Observations  Min A recall with aids, error awareness, problem solving, Supervision A sustained attention  Supervision A  basic problem solving, recall with aids/strategies, emergent awareness   Pain   Pt c/o headache, has prns  Pain less than 5  Assess pain q shift and prn   Skin   Abrasions on elbows, lip and hands. Scattered bruising.  Prevent further skin breakdown  Assess skin q shift and prn    Rehab Goals Patient on target to meet rehab goals: Yes *See Care Plan and progress notes for long and short-term goals.     Barriers to Discharge  Current Status/Progress Possible Resolutions Date Resolved   Nursing                  PT                    OT                  SLP  SW Inaccessible home environment;Decreased caregiver support;Lack of/limited family support;Incontinence;Insurance for SNF coverage Home with spouse, son to assist as needed after work Son assisting both parent, spouse uses a cane to walk, son works, looking to hire help to assist during the day          Discharge Planning/Teaching Needs:  Home with spouse and son  Transfer, B+D, use of equipment, medication management   Team Discussion: Cognition improved, R side ataxia resolved, mild L side ataxia, headaches improved.  RN A+O, nausea, given zofran, no BM since 2/21, cont B/B, crushing meds.  OT S UB B/D, set up grooming, LB B/D min A sit to stand, transfers  toilet min A, dizzyness, downgrade goals to S/CGA.  PT transfers min A, CGA stairs, gait CGA/min A, goals S/min A.  SLP Reg textures, memory notebook in use, min A, goals are S.   Revisions to Treatment Plan: N/A     Medical Summary Current Status: Patient alert, orientation is normal visual issues are combination of premorbid cataract correction as well as stroke related problems with oculovestibular reflexes Weekly Focus/Goal: Headache management has improved no longer requiring Topamax, this may have helped with her overall improved cognition  Barriers to Discharge: Other (comments)  Barriers to Discharge Comments: Ataxia, stroke related visual dysfunction superimposed on premorbid visual acuity issues Possible Resolutions to Barriers: Continue balance training continue continence Tory strategies for patient issues, caregiver training   Continued Need for Acute Rehabilitation Level of Care: The patient requires daily medical management by a physician with specialized training in physical medicine and rehabilitation for the following reasons: Direction of a multidisciplinary physical rehabilitation program to maximize functional independence : Yes Medical management of patient stability for increased activity during participation in an intensive rehabilitation regime.: Yes Analysis of laboratory values and/or radiology reports with any subsequent need for medication adjustment and/or medical intervention. : Yes   I attest that I was present, lead the team conference, and concur with the assessment and plan of the team.   Jodell Cipro M 07/06/2019, 2:04 PM   Team conference was held via web/ teleconference due to [place] - 19

## 2019-07-06 NOTE — Progress Notes (Signed)
Speech Language Pathology Weekly Progress and Session Note  Patient Details  Name: Tanya Knight MRN: [id number] Date of Birth: 1940/10/02  Beginning of progress report period: June 29, 2019 End of progress report period: July 06, 2019  Today's Date: 07/06/2019 SLP Individual Time: 8315-1761    Short Term Goals: Week 3: SLP Short Term Goal 1 (Week 3): Pt will consume current diet with minimal overt s/sx aspiration and demonstrate efficient mastication and oral clearance with Supervision A verbal/visual cues. SLP Short Term Goal 1 - Progress (Week 3): Met SLP Short Term Goal 2 (Week 3): Pt will achieve 90% intelligibility at sentence level in 8/10 opportunities with Supervision A cues for use of increased rate of speech and vocal intensity. SLP Short Term Goal 2 - Progress (Week 3): Met SLP Short Term Goal 3 (Week 3): Pt will initiate during functional familiar tasks in 80% opportunities with Min A multimodal cues. SLP Short Term Goal 3 - Progress (Week 3): Met SLP Short Term Goal 4 (Week 3): Pt will demonstrate emergent awareness evidenced by her ability to detect verbal and/or functional errors during familiar tasks with Min A visual/verbal cues. SLP Short Term Goal 4 - Progress (Week 3): Met SLP Short Term Goal 5 (Week 3): Pt will demonstrate basic problem solving during functional and familiar tasks with Min A verbal/visual cues. SLP Short Term Goal 5 - Progress (Week 3): Met SLP Short Term Goal 6 (Week 3): Pt will demonstrate OX4 with Supervision A verbal/visual cues for use of external aids. SLP Short Term Goal 6 - Progress (Week 3): Met    New Short Term Goals: Week 4: SLP Short Term Goal 1 (Week 4): STG=LTG due to remaining length of stay  Weekly Progress Updates: Pt has made excellent functional gains and met 6 out of 6 short term goals this reporting period. Pt is currently ~Min assist due to cognitive impairments impacting short term memory, basic problem solving, and  emergent awareness. Pt was upgraded to a regular diet with thin liquids and no longer requires full supervision during meals due to increased independence in ability to self monitor sustained attention to intake as well as consistency in ability to maintain alertness. Pt has demonstrated improved use of external aids and strategies to assist with recall of daily and new information, basic problem solving, and sustained attention. Pt education is ongoing. Pt would continue to benefit from skilled ST while inpatient in order to maximize functional independence and reduce burden of care prior to discharge. Anticipate that pt will need 24/7 supervision at discharge in addition to [place] follow up at next level of care.      Intensity: Minumum of 1-2 x/day, 30 to 90 minutes Frequency: 3 to 5 out of 7 days Duration/Length of Stay: 07/13/19 Treatment/Interventions: Cognitive remediation/compensation;Cueing hierarchy;Functional tasks;Patient/family education;Speech/Language facilitation;Internal/external aids;Dysphagia/aspiration precaution training   Daily Session  Skilled Therapeutic Interventions: Pt was seen for skilled ST targeting cognitive goals. SLP independently OX4 today. With 1 vebal cue, pt initiated use of memory notebook to recall daily events and with SLP's assistance with handwriting (difficult for pt due to visual disturbances), she set up a new page in notebook to record today's events. During a familiar money management task from the [place] she demonstrated good carryover of organizational and problem solving strategies implemented to complete this task in previous session with Min A verbal and visual cues. Pt's recall within task also improved with her implementation of verbal repetiton of instructions for working [employer] and verbal problem  solving throughout task. Pt left sitting upright in bed with MD present. Continue per current plan of care.           Pain Pain Assessment Pain Scale:  Faces Faces Pain Scale: No hurt  Therapy/Group: Individual Therapy  Arbutus Leas 07/06/2019, 7:01 AM

## 2019-07-06 NOTE — Progress Notes (Signed)
Physical Therapy Weekly Progress Note  Patient Details  Name: Tanya Knight MRN: [id number] Date of Birth: 07-20-1940  Beginning of progress report period: June 30, 2019 End of progress report period: July 07, 2019   Patient has met 4 of 4 short term goals.  Patient is more alert/awake each day during therapy sessions not requiring any stimulation to keep attention to task. She is  CGA with bed mobility and supine <> sit. CGA-minA with transfers and gait up to 20' with RW and CGA for stair negotiation using B rails.   Patient continues to demonstrate the following deficits muscle weakness, impaired timing and sequencing, decreased visual perceptual skills and decreased visual motor skills, delayed processing and decreased standing balance, decreased postural control and decreased balance strategies and therefore will continue to benefit from skilled PT intervention to increase functional independence with mobility.    Patient progressing toward long term goals.  Continue plan of care.  PT Short Term Goals Week 3:  PT Short Term Goal 1 (Week 3): Pt will perform supine<>sit with CGA PT Short Term Goal 1 - Progress (Week 3): Met PT Short Term Goal 2 (Week 3): Pt will perform sit<>stand and stand pivot transfers using LRAD with min assist PT Short Term Goal 2 - Progress (Week 3): Met PT Short Term Goal 3 (Week 3): Pt will ambulate at least 25f using LRAD with min assist PT Short Term Goal 3 - Progress (Week 3): Met PT Short Term Goal 4 (Week 3): Pt will initiate stair training PT Short Term Goal 4 - Progress (Week 3): Met Week 4:  PT Short Term Goal 1 (Week 4): STG = LTG d/t ELOS  Skilled Therapeutic Interventions/Progress Updates:  Ambulation/gait training;Balance/vestibular training;Cognitive remediation/compensation;DME/adaptive equipment instruction;Functional mobility training;Splinting/orthotics;Therapeutic Activities;UE/LE Strength taining/ROM;UE/LE Coordination  activities;Therapeutic Exercise;Stair training;Patient/family education;Neuromuscular re-education;Disease management/prevention;Discharge planning;Psychosocial support;Wheelchair propulsion/positioning;Pain management   Therapy Documentation Precautions:  Precautions Precautions: Fall Precaution Comments: visual nystagmus Restrictions Weight Bearing Restrictions: No   Therapy/Group: Individual Therapy  NJuliann PulseSPT 07/06/2019, 7:31 AM

## 2019-07-07 ENCOUNTER — Inpatient Hospital Stay (HOSPITAL_COMMUNITY): Payer: Medicare PPO | Admitting: Physical Therapy

## 2019-07-07 ENCOUNTER — Inpatient Hospital Stay (HOSPITAL_COMMUNITY): Payer: Medicare PPO | Admitting: Occupational Therapy

## 2019-07-07 ENCOUNTER — Inpatient Hospital Stay (HOSPITAL_COMMUNITY): Payer: Medicare PPO | Admitting: Speech Pathology

## 2019-07-07 MED ORDER — ENSURE ENLIVE PO LIQD
237.0000 mL | Freq: Three times a day (TID) | ORAL | Status: DC
  Administered 2019-07-07 – 2019-07-13 (×15): 237 mL via ORAL

## 2019-07-07 NOTE — Progress Notes (Signed)
Nutrition Follow-up  DOCUMENTATION CODES:   Not applicable  INTERVENTION:   Snacks BID  Ensure Enlive po TID, each supplement provides 350 kcal and 20 grams of protein  Continue MVI daily  Continue 11ml Pro-stat BID, each supplement provides 100 kcal and 15 grams of protein   NUTRITION DIAGNOSIS:   Increased nutrient needs related to other (see comment)(therapies) as evidenced by estimated needs.  Ongoing.  GOAL:   Patient will meet greater than or equal to 90% of their needs  Progressing.   MONITOR:   PO intake, Supplement acceptance, Weight trends, Labs  REASON FOR ASSESSMENT:   LOS    ASSESSMENT:   Pt with a PMH significant for HTN, GERD, and seizure disorder presented 06/10/2019 with headache, N/V, weakness and slurred speech. Pt admitted to [facility] with Cerebellar vermis ICH with SDH and SAH, likely hypertensive and admitted to CIR on 2/2.  Pt states appetite has been fine, but she reports intermittent nausea that has impacted her PO intake. RN endorses nausea. Pt reports obtaining a Ginger Ale from RN last night and states this helped her nausea significantly. Pt reports drinking all 3 Ensure Enlives, but states she isn't always able to finish them. Pt agreeable to snacks between meals and to continuing Ensure Enlive.   Admit Weight: 124.56 lbs Current Weight: 116.4 lbs  Pt reports UBW before admission was ~120 lbs.  PO Intake: 25-100% x last 8 recorded meals (46% average intake)  Medications reviewed and include: Pepcid, Ensure Enlive BID, 38ml Pro-stat BID, Mag-Ox, MVI, Senokot-S  Labs reviewed.   NUTRITION - FOCUSED PHYSICAL EXAM:    Most Recent Value  Orbital Region  No depletion  Upper Arm Region  Mild depletion  Thoracic and Lumbar Region  No depletion  Buccal Region  No depletion  Temple Region  Mild depletion  Clavicle Bone Region  Mild depletion  Clavicle and Acromion Bone Region  Mild depletion  Scapular Bone Region  Mild depletion  Dorsal  Hand  No depletion  Patellar Region  No depletion  Anterior Thigh Region  No depletion  Posterior Calf Region  No depletion  Edema (RD Assessment)  None  Hair  Reviewed  Eyes  Reviewed  Mouth  Reviewed  Skin  Reviewed  Nails  Reviewed       Diet Order:   Diet Order            Diet regular Room service appropriate? Yes; Fluid consistency: Thin  Diet effective now              EDUCATION NEEDS:   Education needs have been addressed  Skin:  Skin Assessment: Reviewed RN Assessment  Last BM:  2/24  Height:   Ht Readings from Last 1 Encounters:  07/04/19 5\' 1"  (1.549 m)    Weight:   Wt Readings from Last 1 Encounters:  06/30/19 52.8 kg    Ideal Body Weight:  47.72 kg  BMI:  Body mass index is 21.99 kg/m.  Estimated Nutritional Needs:   Kcal:  1350-1550  Protein:  65-80 grams  Fluid:  >1.35L/d    Larkin Ina, MS, RD, LDN RD pager number and weekend/on-call pager number located in [place].

## 2019-07-07 NOTE — Progress Notes (Signed)
Patient requested not to take scheduled morning medications until her nausea subsided. Patient states at this time she is ready for them.

## 2019-07-07 NOTE — Progress Notes (Signed)
Physical Therapy Session Note  Patient Details  Name: Tanya Knight MRN: [id number] Date of Birth: Jul 05, 1940  Today's Date: 07/07/2019 PT Individual Time: 1005-1047 PT Individual Time Calculation (min): 42 min   Short Term Goals: Week 4:  PT Short Term Goal 1 (Week 4): STG = LTG d/t ELOS  Skilled Therapeutic Interventions/Progress Updates:   Pt received sitting in recliner and agreeable to therapy session. Reports "I'm making progress, I didn't throw up this morning" then stating "my head is killing me and I'm dizzy but I'm fine." Sit>stand recliner>RW with min assist for balance due to posterior lean. Gait ~13ft to w/c using RW with CGA/min assist for balance and pt cuing herself correctly through proper AD management when turning to sit.  Transported to/from gym in w/c. Standing with B UE support on litegait and CGA for steadying therapist donned harness. Stepping on/off treadmill while in litegait harness for safety with min assist for balance and cuing for sequencing. Performed the following locomotor treadmill training trails while in litegait harness for safety but not providing BWS: 1st trial: 43min5sec at 0.71mph totaling 151ft without UE support; however, pt demonstrating significant forward trunk lean therefore allowed UE support for 2nd half of the walk - therapist providing multimodal cuing for increased B LE step length and manual facilitation for R/L lateral weight shift onto stance limb  2nd trial: 86min5sec at 0.33mph totaling 166ft without UE support the whole time - with cuing pt able to improve upright trunk with less forward flexion, pt demonstrating improving B LE step length and therapist continuing to provide multimodal cuing for increased step length and manual facilitation for R/L lateral weight shifting Stepped off treadmill and doffed litegait harness as described above. Participated in overground gait training ~7ft, ~33ft (seated break between) without UE support and min  assist for balance - pt demonstrating improving upright trunk posture (no significant forward trunk flexion, able to correct the minimal amount with min cuing), increasing B LE step length, and increasing gait speed - although does have increased postural sway/path deviation. Transported back to room. Gait ~43ft w/c>recline, no AD, again with min assist for balance due to increased postural sway. Pt left seated in recliner with needs in reach, seat belt alarm on, and RN present.   Therapy Documentation Precautions:  Precautions Precautions: Fall Precaution Comments: visual nystagmus Restrictions Weight Bearing Restrictions: No  Pain Details above. RN present at end of session for medication administration as pt deferred intervention during session.   Therapy/Group: Individual Therapy  Tawana Scale, PT, DPT  07/07/2019, 7:56 AM

## 2019-07-07 NOTE — Progress Notes (Signed)
Occupational Therapy Session Note  Patient Details  Name: Tanya Knight MRN: [id number] Date of Birth: 07-14-1940  Today's Date: 07/07/2019 OT Individual Time: [id number] OT Individual Time Calculation (min): 32 min    Short Term Goals: Week 3:  OT Short Term Goal 1 (Week 3): Pt will complete toilet transfers stand pivot with the RW with min assist. OT Short Term Goal 2 (Week 3): Pt will maintain sustained attention for 30 mins with no more than min instructional cueing during ADL tasks. OT Short Term Goal 3 (Week 3): Pt will complete LB dressing sit to stand with min assist. OT Short Term Goal 4 (Week 3): Pt will complete LB bathing sit to stand with min assist.  Skilled Therapeutic Interventions/Progress Updates:    Pt completed LB dressing from cross legged sitting in the bed to sitting.  She was able to donn her TEDs, gripper socks, and pants with min assist sit to stand.  She reported dizziness at 9/10 with sitting as well with standing.  Increased posterior LOB when standing to pull pants over hips as well as with functional transfer over to the bedside chair.  Pt left with call button and phone in reach with safety belt in place.    Therapy Documentation Precautions:  Precautions Precautions: Fall Precaution Comments: visual nystagmus Restrictions Weight Bearing Restrictions: No General:   Vital Signs:  Pain: Pain Assessment Pain Scale: Faces ADL: ADL Grooming: Moderate assistance(secondary to lethargy) Where Assessed-Grooming: Edge of bed Upper Body Bathing: Moderate assistance Where Assessed-Upper Body Bathing: Edge of bed Lower Body Bathing: Dependent Where Assessed-Lower Body Bathing: Edge of bed Upper Body Dressing: Dependent Where Assessed-Upper Body Dressing: Edge of bed Lower Body Dressing: Dependent Where Assessed-Lower Body Dressing: Edge of bed Toileting: Dependent Where Assessed-Toileting: Bedside Commode Vision   Perception    Praxis    Exercises:   Other Treatments:     Therapy/Group: Individual Therapy  Dreon Pineda 07/07/2019, 8:39 AM

## 2019-07-07 NOTE — Progress Notes (Signed)
Speech Language Pathology Daily Session Note  Patient Details  Name: Tanya Knight MRN: [id number] Date of Birth: 1940/06/07  Today's Date: 07/07/2019 SLP Individual Time: 6160-7371 SLP Individual Time Calculation (min): 56 min  Short Term Goals: Week 4: SLP Short Term Goal 1 (Week 4): STG=LTG due to remaining length of stay  Skilled Therapeutic Interventions: Pt was seen for skilled ST targeting cognitive skills. SLP facilitated session with overall Min A verbal cues for recall of medications that are new to this admission, however she recalled 100% functions and some names of familiar home medications Mod I. Pt then used list of current medications to organize a QID pill box with overall Supervision A verbal and visual cues for problem solving; she was able to detect of 2/2 errors during organization of pill box without interventions from SLP. Discussed recommendation for supervision when organizing pills in pill box at home for greatest safety, and pt was in agreement. Pt left sitting in recliner with alarm set and needs met to her satisfaction. Continue per current plan of care.       Pain Pain Assessment Pain Scale: 0-10 Pain Score: 9  Pain Type: Acute pain Pain Location: Head Pain Descriptors / Indicators: Headache Pain Frequency: Intermittent Pain Onset: Gradual Patients Stated Pain Goal: 7 Pain Intervention(s): RN made aware Multiple Pain Sites: No  Therapy/Group: Individual Therapy  Arbutus Leas 07/07/2019, 3:16 PM

## 2019-07-07 NOTE — Progress Notes (Signed)
[place] PHYSICAL MEDICINE & REHABILITATION PROGRESS NOTE   Subjective/Complaints:  Some HA, has intermittent nausea as well.  BM yesterday evening, ready to start with OT   ROS: Denies CP, SOB, N/V/D,?  Reliability  Objective:   No results found. No results for input(s): WBC, HGB, HCT, PLT in the last 72 hours. No results for input(s): NA, K, CL, CO2, GLUCOSE, BUN, CREATININE, CALCIUM in the last 72 hours.  Intake/Output Summary (Last 24 hours) at 07/07/2019 0757 Last data filed at 07/06/2019 1837 Gross per 24 hour  Intake 480 ml  Output --  Net 480 ml     Physical Exam: Vital Signs Blood pressure (!) 151/86, pulse 64, temperature 97.6 F (36.4 C), resp. rate 17, height 5\' 1"  (1.549 m), weight 52.8 kg, SpO2 97 %.   General: No acute distress Mood and affect are appropriate Heart: Regular rate and rhythm no rubs murmurs or extra sounds Lungs: Clear to auscultation, breathing unlabored, no rales or wheezes Abdomen: Positive bowel sounds, soft nontender to palpation, nondistended Extremities: No clubbing, cyanosis, or edema Skin: No evidence of breakdown, no evidence of rash Neurologic:eyes with lg amp saccadic pursuit  motor strength is 4/5 in bilateral deltoid, bicep, tricep, grip, hip flexor, knee extensors, ankle dorsiflexor and plantar flexor  Cerebellar exam mild dysmetria Left l finger to nose to finger Musculoskeletal: Full range of motion in all 4 extremities. No joint swelling  Assessment/Plan: 1. Functional deficits secondary to Cerebellar vermis ICH which require 3+ hours per day of interdisciplinary therapy in a comprehensive inpatient rehab setting.  Physiatrist is providing close team supervision and 24 hour management of active medical problems listed below.  Physiatrist and rehab team continue to assess barriers to discharge/monitor patient progress toward functional and medical goals  Care Tool:  Bathing     Body parts bathed by patient: Right arm,  Left arm, Chest, Abdomen, Front perineal area, Right upper leg, Left upper leg, Face, Right lower leg, Left lower leg, Buttocks   Body parts bathed by helper: Right lower leg, Left lower leg, Buttocks Body parts n/a: Abdomen, Chest(did not attempt this session)   Bathing assist Assist Level: Minimal Assistance - Patient > 75%     Upper Body Dressing/Undressing Upper body dressing   What is the patient wearing?: Pull over shirt    Upper body assist Assist Level: Set up assist    Lower Body Dressing/Undressing Lower body dressing      What is the patient wearing?: Pants, Incontinence brief     Lower body assist Assist for lower body dressing: Minimal Assistance - Patient > 75%     Toileting Toileting    Toileting assist Assist for toileting: Supervision/Verbal cueing     Transfers Chair/bed transfer  Transfers assist  Chair/bed transfer activity did not occur: Safety/medical concerns  Chair/bed transfer assist level: Minimal Assistance - Patient > 75%     Locomotion Ambulation   Ambulation assist   Ambulation activity did not occur: Safety/medical concerns  Assist level: Moderate Assistance - Patient 50 - 74% Assistive device: No Device Max distance: 50'   Walk 10 feet activity   Assist  Walk 10 feet activity did not occur: Safety/medical concerns  Assist level: Moderate Assistance - Patient - 50 - 74% Assistive device: No Device   Walk 50 feet activity   Assist Walk 50 feet with 2 turns activity did not occur: Safety/medical concerns  Assist level: Moderate Assistance - Patient - 50 - 74% Assistive device: No Device  Walk 150 feet activity   Assist Walk 150 feet activity did not occur: Safety/medical concerns         Walk 10 feet on uneven surface  activity   Assist Walk 10 feet on uneven surfaces activity did not occur: Safety/medical concerns         Wheelchair     Assist Will patient use wheelchair at discharge?: No(Not  anticipated; no LT goals set)   Wheelchair activity did not occur: Safety/medical concerns         Wheelchair 50 feet with 2 turns activity    Assist    Wheelchair 50 feet with 2 turns activity did not occur: Safety/medical concerns       Wheelchair 150 feet activity     Assist  Wheelchair 150 feet activity did not occur: Safety/medical concerns       Blood pressure (!) 151/86, pulse 64, temperature 97.6 F (36.4 C), resp. rate 17, height 5\' 1"  (1.549 m), weight 52.8 kg, SpO2 97 %.  Medical assessment and plan:  1.  Truncal ataxia and central vestibular dysfunction secondary to intracranial hemorrhage, cerebellar vermis with SDH and SAH likely related to hypertensive crisis-    Continue CIR PT, OT, SLP  2.  Antithrombotics: -DVT/anticoagulation: Subcutaneous heparin initiated 06/11/2019             -antiplatelet therapy: N/A 3. Pain Management: Continue Tramadol as needed, Fioricet as needed for headaches 4. Mood: In good spirits             -antipsychotic agents: N/A 5. Neuropsych: This patient is NOT capable of making decisions on her own behalf. 6. Skin/Wound Care: Continue routine skin checks 7. Fluids/Electrolytes/Nutrition: Routine in and outs. 8.  Hypertension.  Norvasc 10 mg daily, Toprol-XL 50 mg daily.  Monitor with increased mobility- monitor closely.    Vitals:   07/06/19 1953 07/07/19 0403  BP: 124/80 (!) 151/86  Pulse: 66 64  Resp: 16 17  Temp: (!) 97.3 F (36.3 C) 97.6 F (36.4 C)  SpO2: 98% 97%   Controlled on 2/24 9.  History of seizure disorder.  Continue Depakote 500 mg every 12 hours  Valproic acid within normal limits on 2/16 10. Diplopia- suggest eye patch intermittently to help with double vision 11. Hypokalemia  Resolved   BMP Latest Ref Rng & Units 07/04/2019 06/27/2019 06/24/2019  Glucose 70 - 99 mg/dL 104(H) 105(H) 97  BUN 8 - 23 mg/dL 24(H) 31(H) 22  Creatinine 0.44 - 1.00 mg/dL 0.79 0.66 0.62  Sodium 135 - 145 mmol/L 141  138 133(L)  Potassium 3.5 - 5.1 mmol/L 3.8 3.9 3.8  Chloride 98 - 111 mmol/L 104 107 99  CO2 22 - 32 mmol/L 25 20(L) 22  Calcium 8.9 - 10.3 mg/dL 9.6 9.1 9.4  12. Vascular HA:    Continue Tylenol as needed- working well   Controlled on 2/24 13. Nausea/+/- vomiting-  Improved PRN Zofran 14 GERD: Unchanged PPI to H2 blocker to help with Mg+++ absorption  14. Dysphagia due to CVA   D3 thins, advance as tolerated 15.  Elevated BUN  Trending up on 2/15, normalizing with improved intake   Encourage fluids- only ~358ml recorded per day 16.  Central vestibular disorder, symptomatic at times with dizziness will trial meclizine and monitor for nausea  LOS: 23 days A FACE TO [place] E Madalena Kesecker 07/07/2019, 7:57 AM

## 2019-07-07 NOTE — Progress Notes (Signed)
Team Conference Report to Patient/Family  Team Conference discussion was reviewed with the caregiver, including goals, any changes in plan of care and target discharge date.  The son/caregiver expressed understanding and is in agreement.  The patient has a target discharge date of 07/13/19. Family education set up for 07/11/19 @ 1300.  Dorien Chihuahua B 07/07/2019, 9:07 AM

## 2019-07-07 NOTE — Plan of Care (Signed)
  Problem: Consults Goal: RH STROKE PATIENT EDUCATION Description: See Patient Education module for education specifics  Outcome: Progressing   Problem: RH BOWEL ELIMINATION Goal: RH STG MANAGE BOWEL WITH ASSISTANCE Description: STG Manage Bowel with min Assistance. Outcome: Progressing Goal: RH STG MANAGE BOWEL W/MEDICATION W/ASSISTANCE Description: STG Manage Bowel with Medication with min Assistance. Outcome: Progressing   Problem: RH BLADDER ELIMINATION Goal: RH STG MANAGE BLADDER WITH ASSISTANCE Description: STG Manage Bladder With min Assistance Outcome: Progressing   Problem: RH SKIN INTEGRITY Goal: RH STG SKIN FREE OF INFECTION/BREAKDOWN Description: Patients skin will remain free from further infection or breakdown with min assist. Outcome: Progressing Goal: RH STG MAINTAIN SKIN INTEGRITY WITH ASSISTANCE Description: STG Maintain Skin Integrity With min Assistance. Outcome: Progressing Goal: RH STG ABLE TO PERFORM INCISION/WOUND CARE W/ASSISTANCE Description: STG Able To Perform Incision/Wound Care With min/mod Assistance. Outcome: Progressing   Problem: RH KNOWLEDGE DEFICIT Goal: RH STG INCREASE KNOWLEDGE OF HYPERTENSION Description: Patient/caregiver will verbalize understanding of management of HTN including diet, exercise, medications, monitoring, and follow up care with min assist. Outcome: Progressing Goal: RH STG INCREASE KNOWLEGDE OF HYPERLIPIDEMIA Description: Patient/caregiver will verbalize understanding of management of HLD including diet, exercise, medications, monitoring, and follow up care with min assist. Outcome: Progressing Goal: RH STG INCREASE KNOWLEDGE OF STROKE PROPHYLAXIS Description: Patient/caregiver will verbalize understanding of management of stroke prophylaxis including diet, exercise, medications, monitoring, and follow up care with min assist. Outcome: Progressing

## 2019-07-08 ENCOUNTER — Inpatient Hospital Stay (HOSPITAL_COMMUNITY): Payer: Medicare PPO | Admitting: Physical Therapy

## 2019-07-08 ENCOUNTER — Inpatient Hospital Stay (HOSPITAL_COMMUNITY): Payer: Medicare PPO | Admitting: Speech Pathology

## 2019-07-08 ENCOUNTER — Inpatient Hospital Stay (HOSPITAL_COMMUNITY): Payer: Medicare PPO | Admitting: Occupational Therapy

## 2019-07-08 MED ORDER — TRAZODONE HCL 50 MG PO TABS
50.0000 mg | ORAL_TABLET | Freq: Every evening | ORAL | Status: DC | PRN
  Administered 2019-07-08 – 2019-07-12 (×5): 50 mg via ORAL
  Filled 2019-07-08 (×5): qty 1

## 2019-07-08 NOTE — Progress Notes (Signed)
Speech Language Pathology Daily Session Note  Patient Details  Name: Tanya Knight MRN: [id number] Date of Birth: 1941/02/20  Today's Date: 07/08/2019 SLP Individual Time: 1430-1500 SLP Individual Time Calculation (min): 30 min  Short Term Goals: Week 4: SLP Short Term Goal 1 (Week 4): STG=LTG due to remaining length of stay  Skilled Therapeutic Interventions:  Skilled treatment session targeted cognition goals. SLP facilitiated session by providing checkbook task. Initially pt had difficulty reading the information so adaptations were made. Pt able to list all transactions and perform appropriate calculations. SLP progressed pt to more complex mathematical numbers. Pt able to perform task mentally but unable to visually perform addition/subtraction of complex amounts. Pt requested that I write in her memory book at end of session. Pt left in recliner, seat alarm on and all needs within reach.      Pain Pain Assessment Pain Scale: Faces Pain Score: 0-No pain  Therapy/Group: Individual Therapy  Aidric Endicott 07/08/2019, 3:06 PM

## 2019-07-08 NOTE — Progress Notes (Signed)
Physical Therapy Session Note  Patient Details  Name: Tanya Knight MRN: [id number] Date of Birth: 1940-12-05  Today's Date: 07/08/2019 PT Individual Time: 1300-1344 PT Individual Time Calculation (min): 44 min   Short Term Goals: Week 4:  PT Short Term Goal 1 (Week 4): STG = LTG d/t ELOS  Skilled Therapeutic Interventions/Progress Updates: Pt presents sitting in recliner chair w/ LEs elevated.  Pt states HA of 8/10 and has requested meds from nursing, but has not received yet.  Pt agreeable to therapy although dizziness continues.  BP taken in sitting w/ 124/68 and HR of 85, standing at 116/75 and HR 88.  Pt performed standing balance w/ focus on item on wall for improved dizziness, minimal effect.  Pt amb multiple trials w/ RW and CGA to min A as turns for support.  Pt requires verbal cueing for safe turns with step through gait pattern.  Pt tends to retropulse w/ approach to seat.  Verbal cues for hand placement, especially w/ stand to sit.  Pt returned to recliner w/ chair alarm on and all needs within reach.  Nursing in room to administer pain meds for HA.     Therapy Documentation Precautions:  Precautions Precautions: Fall Precaution Comments: visual nystagmus Restrictions Weight Bearing Restrictions: No General:   Vital Signs:   Pain: Pain Assessment Pain Scale: Faces Pain Score: 0-No pain Pt states HA 8/10, has requested pain meds from nursing.      Therapy/Group: Individual Therapy  Ladoris Gene 07/08/2019, 1:46 PM

## 2019-07-08 NOTE — Progress Notes (Signed)
[place] PHYSICAL MEDICINE & REHABILITATION PROGRESS NOTE   Subjective/Complaints:  Poor sleep at noc  ROS: Denies CP, SOB, N/V/D,?  Reliability  Objective:   No results found. No results for input(s): WBC, HGB, HCT, PLT in the last 72 hours. No results for input(s): NA, K, CL, CO2, GLUCOSE, BUN, CREATININE, CALCIUM in the last 72 hours.  Intake/Output Summary (Last 24 hours) at 07/08/2019 0831 Last data filed at 07/07/2019 1742 Gross per 24 hour  Intake 320 ml  Output --  Net 320 ml     Physical Exam: Vital Signs Blood pressure 130/71, pulse 77, temperature 98.1 F (36.7 C), resp. rate 18, height 5\' 1"  (1.549 m), weight 52.8 kg, SpO2 99 %.   General: No acute distress Mood and affect are appropriate Heart: Regular rate and rhythm no rubs murmurs or extra sounds Lungs: Clear to auscultation, breathing unlabored, no rales or wheezes Abdomen: Positive bowel sounds, soft nontender to palpation, nondistended Extremities: No clubbing, cyanosis, or edema Skin: No evidence of breakdown, no evidence of rash Neurologic:eyes with lg amp saccadic pursuit  motor strength is 4/5 in bilateral deltoid, bicep, tricep, grip, hip flexor, knee extensors, ankle dorsiflexor and plantar flexor  Cerebellar exam minimal  dysmetria Left l finger to nose to finger Musculoskeletal: Full range of motion in all 4 extremities. No joint swelling  Assessment/Plan: 1. Functional deficits secondary to Cerebellar vermis ICH which require 3+ hours per day of interdisciplinary therapy in a comprehensive inpatient rehab setting.  Physiatrist is providing close team supervision and 24 hour management of active medical problems listed below.  Physiatrist and rehab team continue to assess barriers to discharge/monitor patient progress toward functional and medical goals  Care Tool:  Bathing     Body parts bathed by patient: Right arm, Left arm, Chest, Abdomen, Front perineal area, Right upper leg, Left  upper leg, Face, Right lower leg, Left lower leg, Buttocks   Body parts bathed by helper: Right lower leg, Left lower leg, Buttocks Body parts n/a: Abdomen, Chest(did not attempt this session)   Bathing assist Assist Level: Minimal Assistance - Patient > 75%     Upper Body Dressing/Undressing Upper body dressing   What is the patient wearing?: Pull over shirt    Upper body assist Assist Level: Set up assist    Lower Body Dressing/Undressing Lower body dressing      What is the patient wearing?: Pants     Lower body assist Assist for lower body dressing: Minimal Assistance - Patient > 75%     Toileting Toileting    Toileting assist Assist for toileting: Supervision/Verbal cueing     Transfers Chair/bed transfer  Transfers assist  Chair/bed transfer activity did not occur: Safety/medical concerns  Chair/bed transfer assist level: Minimal Assistance - Patient > 75%     Locomotion Ambulation   Ambulation assist   Ambulation activity did not occur: Safety/medical concerns  Assist level: Minimal Assistance - Patient > 75% Assistive device: No Device Max distance: 27ft   Walk 10 feet activity   Assist  Walk 10 feet activity did not occur: Safety/medical concerns  Assist level: Minimal Assistance - Patient > 75% Assistive device: No Device   Walk 50 feet activity   Assist Walk 50 feet with 2 turns activity did not occur: Safety/medical concerns  Assist level: Minimal Assistance - Patient > 75% Assistive device: No Device    Walk 150 feet activity   Assist Walk 150 feet activity did not occur: Safety/medical concerns  Walk 10 feet on uneven surface  activity   Assist Walk 10 feet on uneven surfaces activity did not occur: Safety/medical concerns         Wheelchair     Assist Will patient use wheelchair at discharge?: No(Not anticipated; no LT goals set)   Wheelchair activity did not occur: Safety/medical concerns          Wheelchair 50 feet with 2 turns activity    Assist    Wheelchair 50 feet with 2 turns activity did not occur: Safety/medical concerns       Wheelchair 150 feet activity     Assist  Wheelchair 150 feet activity did not occur: Safety/medical concerns       Blood pressure 130/71, pulse 77, temperature 98.1 F (36.7 C), resp. rate 18, height 5\' 1"  (1.549 m), weight 52.8 kg, SpO2 99 %.  Medical assessment and plan:  1.  Truncal ataxia and central vestibular dysfunction secondary to intracranial hemorrhage, cerebellar vermis with SDH and SAH likely related to hypertensive crisis-    Continue CIR PT, OT, SLP, d/c date 3/3  2.  Antithrombotics: -DVT/anticoagulation: Subcutaneous heparin initiated 06/11/2019             -antiplatelet therapy: N/A 3. Pain Management: Continue Tramadol as needed, Fioricet as needed for headaches 4. Mood: In good spirits             -antipsychotic agents: N/A 5. Neuropsych: This patient is NOT capable of making decisions on her own behalf. 6. Skin/Wound Care: Continue routine skin checks 7. Fluids/Electrolytes/Nutrition: Routine in and outs. 8.  Hypertension.  Norvasc 10 mg daily, Toprol-XL 50 mg daily.  Monitor with increased mobility- monitor closely.    Vitals:   07/07/19 2002 07/08/19 0523  BP: (!) 150/81 130/71  Pulse: 65 77  Resp: 19 18  Temp: 97.7 F (36.5 C) 98.1 F (36.7 C)  SpO2: 100% 99%   Controlled on 2/26 9.  History of seizure disorder.  Continue Depakote 500 mg every 12 hours  Valproic acid within normal limits on 2/16 10. Diplopia- suggest eye patch intermittently to help with double vision 11. Hypokalemia  Resolved   BMP Latest Ref Rng & Units 07/04/2019 06/27/2019 06/24/2019  Glucose 70 - 99 mg/dL 104(H) 105(H) 97  BUN 8 - 23 mg/dL 24(H) 31(H) 22  Creatinine 0.44 - 1.00 mg/dL 0.79 0.66 0.62  Sodium 135 - 145 mmol/L 141 138 133(L)  Potassium 3.5 - 5.1 mmol/L 3.8 3.9 3.8  Chloride 98 - 111 mmol/L 104 107 99  CO2  22 - 32 mmol/L 25 20(L) 22  Calcium 8.9 - 10.3 mg/dL 9.6 9.1 9.4  12. Vascular HA:    Continue Tylenol as needed- working well   Controlled on 2/26 13. Nausea/+/- vomiting-  Improved PRN Zofran 14 GERD: Unchanged PPI to H2 blocker to help with Mg+++ absorption  14. Dysphagia due to CVA   D3 thins, advance as tolerated 15.  Elevated BUN  Trending up on 2/15, normalizing with improved intake   Encourage fluids- only ~343ml recorded per day 16.  Central vestibular disorder, symptomatic at times with dizziness will trial meclizine and monitor for nausea  17. Insomnia trial trazodone  LOS: 24 days A FACE TO FACE EVALUATION WAS PERFORMED  Charlett Blake 07/08/2019, 8:31 AM

## 2019-07-08 NOTE — Progress Notes (Signed)
Occupational Therapy Weekly Progress Note  Patient Details  Name: Tanya Knight MRN: [id number] Date of Birth: 1940/08/25  Beginning of progress report period: July 02, 2019 End of progress report period: July 08, 2019  Today's Date: 07/08/2019 OT Individual Time: 0737-1062 OT Individual Time Calculation (min): 60 min    Patient has met 4 of 4 short term goals.  Tanya Knight is making steady progress with OT at this time.  Over the past week her attention has improved dramatically as well as her memory.  She is able to complete bathing tasks sit to stand at min guard assist in the shower with use of a grab bar for support and only min questioning cueing to sequence task.  She completes dressing sit to stand from the EOB with min assist.  LOB posteriorly occurs at times when she is attempting to pull her pants over her hips.  Transfers are currently at a min assist level overall with use of the RW and LOB to the left intermittently.  She still exhibits significant dizziness at rest with saccadic eye movements noted with visual tracking as well as fixed gaze.  We have been trying to have her work on fixed gaze with all transitions supine to sit and sit to stand to help reduce the symptoms, but so far, this has had little affect on reducing the symptoms.  Feel she is on target to meet contact guard assist goals at discharge expected 3/3.  Will continue with current OT POC and 15/7 treatment to reach these established goals.    Patient continues to demonstrate the following deficits: muscle weakness, unbalanced muscle activation and decreased coordination, decreased memory, central origin and decreased standing balance and decreased balance strategies and therefore will continue to benefit from skilled OT intervention to enhance overall performance with BADL, iADL and Reduce care partner burden.  Patient progressing toward long term goals..  Continue plan of care.  OT Short Term Goals Week 4:   OT Short Term Goal 1 (Week 4): Continue working on established LTGs set at supervision to min guard assist level.  Skilled Therapeutic Interventions/Progress Updates:    Pt completed bathing and dressing during session.  She completed transfers with use of the RW for support and min assist to the tub bench in the bathroom.  One LOB to the noted during transfer.  She completed UB bathing with supervision and LB bathing with min guard assist using the grab bar for support.  Min questioning cueing needed for thoroughness during bathing overall.  She completed dressing tasks sit to stand from the EOB with min assist.  Mod instructional cueing during session to make sure and turn herself square to the surface she is transferring to and backing up with the walker instead of trying to sit down too early.  She completed last transfer to the bedside recliner in this manner with min guard assist and no LOB.  Pt left with call button and phone in reach and safety belt in place.     Therapy Documentation Precautions:  Precautions Precautions: Fall Precaution Comments: visual nystagmus Restrictions Weight Bearing Restrictions: No  Pain: Pain Assessment Pain Scale: Faces Pain Score: 0-No pain ADL: See Care Tool Section for some details of mobility and selfcare  Therapy/Group: Individual Therapy  [name] OTR/L 07/08/2019, 12:36 PM

## 2019-07-09 ENCOUNTER — Inpatient Hospital Stay (HOSPITAL_COMMUNITY): Payer: Medicare PPO | Admitting: Occupational Therapy

## 2019-07-09 ENCOUNTER — Inpatient Hospital Stay (HOSPITAL_COMMUNITY): Payer: Medicare PPO | Admitting: Speech Pathology

## 2019-07-09 NOTE — Progress Notes (Signed)
Occupational Therapy Session Note  Patient Details  Name: Tanya Knight MRN: [id number] Date of Birth: Apr 12, 1941  Today's Date: 07/09/2019 OT Individual Time: 1001-1057 OT Individual Time Calculation (min): 56 min   Skilled Therapeutic Interventions/Progress Updates:    Pt greeted in the recliner with no c/o pain. Agreeable to shower. Already completed oral care and toileting with RN staff. She completed an ambulatory transfer to the TTB using RW with steady assist. Pt doffed clothing sit<stand using the grab bar with steady assist as well. Pt able to self sequence bathing tasks with increased time, talking aloud about motor plan at times. Seated figure 4 for washing feet. Afterwards pt ambulated with device to the recliner where she dressed at sit<stand level using the RW. Steady assist for standing balance while elevating pants over hips. Setup for grooming tasks and donning overhead shirt. We then prepared a few laundry bags for her son to take home to wash this weekend. Pts homework was to remember to remind him about laundry when he visits. Pt remembered to ask therapist to record therapeutic events in her memory journal. Pt able to recall events acurrately and write them down herself, needing to close one eye to do so. Pt reports she can't read what she's written due to impaired vision. We discussed her leisure interest of reading and how vision impacts participation. Suggested to pt trying audiobooks at home, and how this occupation can help her work on working and [employer] post d/c. Pt appreciative. Left her with all needs within reach and safety belt fastened.   Therapy Documentation Precautions:  Precautions Precautions: Fall Precaution Comments: visual nystagmus Restrictions Weight Bearing Restrictions: No Vital Signs: Therapy Vitals Temp: 98.2 F (36.8 C) Pulse Rate: 66 Resp: 18 BP: 131/72 Patient Position (if appropriate): Sitting Oxygen Therapy SpO2: 99 % O2 Device: Room  Air ADL: ADL Grooming: Moderate assistance(secondary to lethargy) Where Assessed-Grooming: Edge of bed Upper Body Bathing: Moderate assistance Where Assessed-Upper Body Bathing: Edge of bed Lower Body Bathing: Dependent Where Assessed-Lower Body Bathing: Edge of bed Upper Body Dressing: Dependent Where Assessed-Upper Body Dressing: Edge of bed Lower Body Dressing: Dependent Where Assessed-Lower Body Dressing: Edge of bed Toileting: Dependent Where Assessed-Toileting: Bedside Commode      Therapy/Group: Individual Therapy  Skeet Simmer 07/09/2019, 4:33 PM

## 2019-07-09 NOTE — Progress Notes (Signed)
[place] PHYSICAL MEDICINE & REHABILITATION PROGRESS NOTE   Subjective/Complaints:  Had a better night of sleep. Still doesn't sleep a whole lot but that's her baseline  ROS: Patient denies fever, rash, sore throat, blurred vision, nausea, vomiting, diarrhea, cough, shortness of breath or chest pain, joint or back pain, headache, or mood change.    Objective:   No results found. No results for input(s): WBC, HGB, HCT, PLT in the last 72 hours. No results for input(s): NA, K, CL, CO2, GLUCOSE, BUN, CREATININE, CALCIUM in the last 72 hours.  Intake/Output Summary (Last 24 hours) at 07/09/2019 1246 Last data filed at 07/08/2019 1907 Gross per 24 hour  Intake 300 ml  Output --  Net 300 ml     Physical Exam: Vital Signs Blood pressure 139/70, pulse 63, temperature (!) 97.5 F (36.4 C), temperature source Oral, resp. rate 19, height 5\' 1"  (1.549 m), weight 52.8 kg, SpO2 100 %.   Constitutional: No distress . Vital signs reviewed. HEENT: EOMI, oral membranes moist Neck: supple Cardiovascular: RRR without murmur. No JVD    Respiratory/Chest: CTA Bilaterally without wheezes or rales. Normal effort    GI/Abdomen: BS +, non-tender, non-distended Ext: no clubbing, cyanosis, or edema Skin: No evidence of breakdown, no evidence of rash Neurologic:eyes with lrg amp saccadic pursuit  motor strength is 4/5 in bilateral deltoid, bicep, tricep, grip, hip flexor, knee extensors, ankle dorsiflexor and plantar flexor--some diplopia and depth perception issues Cerebellar exam minimal  dysmetria Left l finger to nose to finger Musculoskeletal: normal rom   Assessment/Plan: 1. Functional deficits secondary to Cerebellar vermis ICH which require 3+ hours per day of interdisciplinary therapy in a comprehensive inpatient rehab setting.  Physiatrist is providing close team supervision and 24 hour management of active medical problems listed below.  Physiatrist and rehab team continue to assess  barriers to discharge/monitor patient progress toward functional and medical goals  Care Tool:  Bathing     Body parts bathed by patient: Right arm, Left arm, Chest, Abdomen, Front perineal area, Right upper leg, Left upper leg, Face, Right lower leg, Left lower leg, Buttocks   Body parts bathed by helper: Right lower leg, Left lower leg, Buttocks Body parts n/a: Abdomen, Chest(did not attempt this session)   Bathing assist Assist Level: Contact Guard/Touching assist     Upper Body Dressing/Undressing Upper body dressing   What is the patient wearing?: Pull over shirt    Upper body assist Assist Level: Set up assist    Lower Body Dressing/Undressing Lower body dressing      What is the patient wearing?: Pants     Lower body assist Assist for lower body dressing: Contact Guard/Touching assist     Toileting Toileting    Toileting assist Assist for toileting: Supervision/Verbal cueing     Transfers Chair/bed transfer  Transfers assist  Chair/bed transfer activity did not occur: Safety/medical concerns  Chair/bed transfer assist level: Minimal Assistance - Patient > 75%     Locomotion Ambulation   Ambulation assist   Ambulation activity did not occur: Safety/medical concerns  Assist level: Contact Guard/Touching assist Assistive device: Walker-rolling Max distance: 40'   Walk 10 feet activity   Assist  Walk 10 feet activity did not occur: Safety/medical concerns  Assist level: Contact Guard/Touching assist Assistive device: Walker-rolling   Walk 50 feet activity   Assist Walk 50 feet with 2 turns activity did not occur: Safety/medical concerns  Assist level: Minimal Assistance - Patient > 75% Assistive device: No Device  Walk 150 feet activity   Assist Walk 150 feet activity did not occur: Safety/medical concerns         Walk 10 feet on uneven surface  activity   Assist Walk 10 feet on uneven surfaces activity did not occur:  Safety/medical concerns         Wheelchair     Assist Will patient use wheelchair at discharge?: No(Not anticipated; no LT goals set)   Wheelchair activity did not occur: Safety/medical concerns         Wheelchair 50 feet with 2 turns activity    Assist    Wheelchair 50 feet with 2 turns activity did not occur: Safety/medical concerns       Wheelchair 150 feet activity     Assist  Wheelchair 150 feet activity did not occur: Safety/medical concerns       Blood pressure 139/70, pulse 63, temperature (!) 97.5 F (36.4 C), temperature source Oral, resp. rate 19, height 5\' 1"  (1.549 m), weight 52.8 kg, SpO2 100 %.  Medical assessment and plan:  1.  Truncal ataxia and central vestibular dysfunction secondary to intracranial hemorrhage, cerebellar vermis with SDH and SAH likely related to hypertensive crisis-    Continue CIR PT, OT, SLP, d/c date 3/3  -outpt ophtho assessment 2.  Antithrombotics: -DVT/anticoagulation: Subcutaneous heparin initiated 06/11/2019             -antiplatelet therapy: N/A 3. Pain Management: Continue Tramadol as needed, Fioricet as needed for headaches 4. Mood: In good spirits             -antipsychotic agents: N/A 5. Neuropsych: This patient is NOT capable of making decisions on her own behalf. 6. Skin/Wound Care: Continue routine skin checks 7. Fluids/Electrolytes/Nutrition: Routine in and outs. 8.  Hypertension.  Norvasc 10 mg daily, Toprol-XL 50 mg daily.  Monitor with increased mobility- monitor closely.    Vitals:   07/08/19 2009 07/09/19 0542  BP: 137/74 139/70  Pulse: (!) 58 63  Resp:  19  Temp:  (!) 97.5 F (36.4 C)  SpO2: 99% 100%   Controlled on 2/27 9.  History of seizure disorder.  Continue Depakote 500 mg every 12 hours  Valproic acid within normal limits on 2/16 10. Diplopia- suggest eye patch intermittently to help with double vision 11. Hypokalemia  Resolved   BMP Latest Ref Rng & Units 07/04/2019 06/27/2019  06/24/2019  Glucose 70 - 99 mg/dL 104(H) 105(H) 97  BUN 8 - 23 mg/dL 24(H) 31(H) 22  Creatinine 0.44 - 1.00 mg/dL 0.79 0.66 0.62  Sodium 135 - 145 mmol/L 141 138 133(L)  Potassium 3.5 - 5.1 mmol/L 3.8 3.9 3.8  Chloride 98 - 111 mmol/L 104 107 99  CO2 22 - 32 mmol/L 25 20(L) 22  Calcium 8.9 - 10.3 mg/dL 9.6 9.1 9.4  12. Vascular HA:    Continue Tylenol as needed- working well   Controlled on 2/27 13. Nausea/+/- vomiting-  Improved PRN Zofran 14 GERD: Unchanged PPI to H2 blocker to help with Mg+++ absorption  14. Dysphagia due to CVA   D3 thins, advance as tolerated 15.  Elevated BUN  Trending up on 2/15, normalizing with improved intake   Encourage fluids- only ~373ml recorded per day 16.  Central vestibular disorder, symptomatic at times with dizziness will trial meclizine and monitor for nausea  17. Insomnia trial trazodone---improvement    LOS: 25 days A FACE TO FACE EVALUATION WAS PERFORMED  Meredith Staggers 07/09/2019, 12:46 PM

## 2019-07-09 NOTE — Progress Notes (Signed)
Speech Language Pathology Daily Session Note  Patient Details  Name: Tanya Knight MRN: [id number] Date of Birth: 1940-08-22  Today's Date: 07/09/2019 SLP Individual Time: 0700-0800 SLP Individual Time Calculation (min): 60 min  Short Term Goals: Week 4: SLP Short Term Goal 1 (Week 4): STG=LTG due to remaining length of stay  Skilled Therapeutic Interventions: Patient received skilled SLP services targeting cognitive goals. Patient utilized memory book to recall daily tasks from this week with verbal cue x1. Patient requested to go to the bathroom. Patient required supervision to recall steps to safely transfer from bed to bathroom with use of her walker. Patient recalled 3 external compensatory memory strategies she can implement in the home environment with min verbal cues. SLP facilitated use of list making as an external compensatory memory strategy with patient creating a grocery list of 10 items she needs at the store with supervision. Patient completed a serial reversal task for sets of 3 related words with 70% accuracy and min verbal cues. At the end of therapy session patient was upright in bed, bed alarm activated, and nursing present in room administering medications.  Pain Pain Assessment Pain Scale: Faces Faces Pain Scale: No hurt  Therapy/Group: Individual Therapy  Cristy Folks 07/09/2019, 8:01 AM

## 2019-07-10 ENCOUNTER — Inpatient Hospital Stay (HOSPITAL_COMMUNITY): Payer: Medicare PPO | Admitting: Speech Pathology

## 2019-07-10 ENCOUNTER — Inpatient Hospital Stay (HOSPITAL_COMMUNITY): Payer: Medicare PPO | Admitting: Occupational Therapy

## 2019-07-10 NOTE — Progress Notes (Addendum)
Occupational Therapy Session Note  Patient Details  Name: Tanya Knight MRN: [id number] Date of Birth: 17-Oct-1940  Today's Date: 07/10/2019 OT Individual Time: 0905-1000 OT Individual Time Calculation (min): 55 min    Short Term Goals: Week 4:  OT Short Term Goal 1 (Week 4): Continue working on established LTGs set at supervision to min guard assist level.  Skilled Therapeutic Interventions/Progress Updates:    Session 1:  0905-1000) Pt seen for OT treatment with report of dizziness at a 6/10 while in the bed.  She was able to transition to the EOB for LB dressing tasks which were completed with min guard assist sit to stand.  She was able to complete grooming task of brushing her teeth in standing at the sink with min guard assist as well.  One LOB backwards when attempting to step back to the wheelchair without BUE support on the walker.  Next, she was taken down to the ADL apartment where therapist reviewed proper technique for transferring into and out of the walk-in shower.  She was then able to complete with min assist using the RW and stepping in backwards and turned to the seat with simulated grab bars.  Pt with increased dizziness with sit to stand and standing, requiring rest breaks with eyes closed to help calm things down.  Finished session with return to the room and pt transferring to the recliner with call button and phone in reach. Safety alarm belt in place as well.    Session 2 [phone])  Pt's son present for session so worked on education of transfers to the toilet and the walk-in shower.  Tanya Knight was able to complete functional mobility from the recliner to the 3:1 over the toilet with min guard assist using the RW for support.  She was able to complete all aspects of toileting at min guard as well and ambulated out to the sink for washing her hands.  Therapist then took pt and her son down to the ADL apartment where she practiced the walk-in shower transfer to the shower  seat.  This was performed with min guard assist using the RW.  Discussed need for removal of throw rugs in the bathroom as well as the need for a shower seat, which they already have.  She was able to step on posteriorly using the walker and then turn and sit down.  Did talk about just backing into the shower and sitting, by having the shower seat turned out toward the entrance, as to eliminate turning and possible LOB as well.  Finished session with return to the room and pt transferring to the bed.  Therapist also provided discussion with pt and her son on her current visual deficits and treatment with visual tracking and gaze stabilization.  Educated on need for fixed gaze compensation with transitional movements as well as when driving in a car, to help decrease dizziness.  Exercises for visual tracking and gaze stabilization have not been performed much at this point as pt stays nauseated a lot and rates her dizziness at 7-9/10 most of the time.  Feel like this will need to be addressed more through home health as tolerated.    Therapy Documentation Precautions:  Precautions Precautions: Fall Precaution Comments: visual nystagmus Restrictions Weight Bearing Restrictions: No   Pain: Pain Assessment Pain Scale: Faces Pain Score: 0-No pain ADL: See Care Tool Section for some details of mobility and selfcare  Therapy/Group: Individual Therapy  Chesnee Floren OTR/L 07/10/2019, 12:13 PM

## 2019-07-10 NOTE — Progress Notes (Signed)
Tanya Knight PHYSICAL MEDICINE & REHABILITATION PROGRESS NOTE   Subjective/Complaints:  No problems this morning. OT reports dizziness with his session just now.   Objective:   No results found. No results for input(s): WBC, HGB, HCT, PLT in the last 72 hours. No results for input(s): NA, K, CL, CO2, GLUCOSE, BUN, CREATININE, CALCIUM in the last 72 hours.  Intake/Output Summary (Last 24 hours) at 07/10/2019 1220 Last data filed at 07/10/2019 0700 Gross per 24 hour  Intake 360 ml  Output --  Net 360 ml     Physical Exam: Vital Signs Blood pressure 120/68, pulse 66, temperature 98.4 F (36.9 C), resp. rate 19, height 5\' 1"  (1.549 m), weight 52.8 kg, SpO2 99 %.   Constitutional: No distress . Vital signs reviewed. HEENT: EOMI, oral membranes moist Neck: supple Cardiovascular: RRR without murmur. No JVD    Respiratory/Chest: CTA Bilaterally without wheezes or rales. Normal effort    GI/Abdomen: BS +, non-tender, non-distended Ext: no clubbing, cyanosis, or edema Skin: No evidence of breakdown, no evidence of rash Neurologic: motor strength is 4/5 in bilateral deltoid, bicep, tricep, grip, hip flexor, knee extensors, ankle dorsiflexor and plantar flexor--some diplopia and depth perception issues Cerebellar exam minimal  dysmetria Left l finger to nose to finger Musculoskeletal: normal rom   Assessment/Plan: 1. Functional deficits secondary to Cerebellar vermis ICH which require 3+ hours per day of interdisciplinary therapy in a comprehensive inpatient rehab setting.  Physiatrist is providing close team supervision and 24 hour management of active medical problems listed below.  Physiatrist and rehab team continue to assess barriers to discharge/monitor patient progress toward functional and medical goals  Care Tool:  Bathing     Body parts bathed by patient: Right arm, Left arm, Chest, Abdomen, Front perineal area, Right upper leg, Left upper leg, Face, Right lower leg, Left  lower leg, Buttocks   Body parts bathed by helper: Right lower leg, Left lower leg, Buttocks Body parts n/a: Abdomen, Chest(did not attempt this session)   Bathing assist Assist Level: Contact Guard/Touching assist     Upper Body Dressing/Undressing Upper body dressing   What is the patient wearing?: Pull over shirt    Upper body assist Assist Level: Set up assist    Lower Body Dressing/Undressing Lower body dressing      What is the patient wearing?: Pants     Lower body assist Assist for lower body dressing: Contact Guard/Touching assist     Toileting Toileting    Toileting assist Assist for toileting: Supervision/Verbal cueing     Transfers Chair/bed transfer  Transfers assist  Chair/bed transfer activity did not occur: Safety/medical concerns  Chair/bed transfer assist level: Contact Guard/Touching assist     Locomotion Ambulation   Ambulation assist   Ambulation activity did not occur: Safety/medical concerns  Assist level: Contact Guard/Touching assist Assistive device: Walker-rolling Max distance: 40'   Walk 10 feet activity   Assist  Walk 10 feet activity did not occur: Safety/medical concerns  Assist level: Contact Guard/Touching assist Assistive device: Walker-rolling   Walk 50 feet activity   Assist Walk 50 feet with 2 turns activity did not occur: Safety/medical concerns  Assist level: Minimal Assistance - Patient > 75% Assistive device: No Device    Walk 150 feet activity   Assist Walk 150 feet activity did not occur: Safety/medical concerns         Walk 10 feet on uneven surface  activity   Assist Walk 10 feet on uneven surfaces activity did  not occur: Safety/medical concerns         Wheelchair     Assist Will patient use wheelchair at discharge?: No(Not anticipated; no LT goals set)   Wheelchair activity did not occur: Safety/medical concerns         Wheelchair 50 feet with 2 turns  activity    Assist    Wheelchair 50 feet with 2 turns activity did not occur: Safety/medical concerns       Wheelchair 150 feet activity     Assist  Wheelchair 150 feet activity did not occur: Safety/medical concerns       Blood pressure 120/68, pulse 66, temperature 98.4 F (36.9 C), resp. rate 19, height 5\' 1"  (1.549 m), weight 52.8 kg, SpO2 99 %.  Medical assessment and plan:  1.  Truncal ataxia and central vestibular dysfunction secondary to intracranial hemorrhage, cerebellar vermis with SDH and SAH likely related to hypertensive crisis-    Continue CIR PT, OT, SLP, d/c date 3/3  -outpt ophtho assessment  -working through vestibular symptoms as possible with OT/PT 2.  Antithrombotics: -DVT/anticoagulation: Subcutaneous heparin initiated 06/11/2019             -antiplatelet therapy: N/A 3. Pain Management: Continue Tramadol as needed, Fioricet as needed for headaches 4. Mood: In good spirits             -antipsychotic agents: N/A 5. Neuropsych: This patient is NOT capable of making decisions on her own behalf. 6. Skin/Wound Care: Continue routine skin checks 7. Fluids/Electrolytes/Nutrition: Routine in and outs. 8.  Hypertension.  Norvasc 10 mg daily, Toprol-XL 50 mg daily.  Monitor with increased mobility- monitor closely.    Vitals:   07/09/19 2014 07/10/19 0612  BP: (!) 145/79 120/68  Pulse: 68 66  Resp: 19 19  Temp: 98.1 F (36.7 C) 98.4 F (36.9 C)  SpO2: 99% 99%   Controlled on 2/28 9.  History of seizure disorder.  Continue Depakote 500 mg every 12 hours  Valproic acid within normal limits on 2/16 10. Diplopia- suggest eye patch intermittently to help with double vision 11. Hypokalemia  Resolved   BMP Latest Ref Rng & Units 07/04/2019 06/27/2019 06/24/2019  Glucose 70 - 99 mg/dL 104(H) 105(H) 97  BUN 8 - 23 mg/dL 24(H) 31(H) 22  Creatinine 0.44 - 1.00 mg/dL 0.79 0.66 0.62  Sodium 135 - 145 mmol/L 141 138 133(L)  Potassium 3.5 - 5.1 mmol/L 3.8 3.9  3.8  Chloride 98 - 111 mmol/L 104 107 99  CO2 22 - 32 mmol/L 25 20(L) 22  Calcium 8.9 - 10.3 mg/dL 9.6 9.1 9.4  12. Vascular HA:    Continue Tylenol as needed- working well   Controlled on 2/28 13. Nausea/+/- vomiting-  Improved PRN Zofran 14 GERD: Unchanged PPI to H2 blocker to help with Mg+++ absorption  14. Dysphagia due to CVA   D3 thins, advance as tolerated 15.  Elevated BUN  Trending up on 2/15, normalizing with improved intake   Encourage fluids- only ~312ml recorded per day 16.  Central vestibular disorder, symptomatic at times with dizziness will trial meclizine and monitor for nausea  17. Insomnia trial trazodone---improvement    LOS: 26 days A FACE TO FACE EVALUATION WAS PERFORMED  Meredith Staggers 07/10/2019, 12:20 PM

## 2019-07-10 NOTE — Progress Notes (Signed)
Speech Language Pathology Daily Session Note  Patient Details  Name: Tanya Knight MRN: [id number] Date of Birth: [birth year]  Today's Date: 07/10/2019 SLP Individual Time: 1300-1346 SLP Individual Time Calculation (min): 46 min  Short Term Goals: Week 4: SLP Short Term Goal 1 (Week 4): STG=LTG due to remaining length of stay  Skilled Therapeutic Interventions: Pt was seen for skilled ST targeting cognitive goals. Pt located and used memory notebook to recall details of weekend therapy Mod I. She also recalled new PRN medication functions with Supervision A verbal cues, Min A verbal cues required for 1 out of 2 other new scheduled medicines. During a novel memory game, pt used word and picture association compensatory memory strategy with overall Min A verbal cues. Pt's son was present at bedside for ~50% session and discussed pt's excellent progress toward cognitive goals over the last week. He had questions about pt's vision, which SLP deferred to MD and OT. Pt left sitting in recliner with alarm set and needs within reach, son still present. Continue per current plan of care.       Pain Pain Assessment Pain Scale: 0-10 Pain Score: 0-No pain  Therapy/Group: Individual Therapy  Arbutus Leas 07/10/2019, 1:51 PM

## 2019-07-11 ENCOUNTER — Encounter (HOSPITAL_COMMUNITY): Payer: Medicare PPO | Admitting: Speech Pathology

## 2019-07-11 ENCOUNTER — Ambulatory Visit (HOSPITAL_COMMUNITY): Payer: Medicare PPO

## 2019-07-11 ENCOUNTER — Encounter (HOSPITAL_COMMUNITY): Payer: Medicare PPO | Admitting: Occupational Therapy

## 2019-07-11 LAB — CBC WITH DIFFERENTIAL/PLATELET
Abs Immature Granulocytes: 0.02 10*3/uL (ref 0.00–0.07)
Basophils Absolute: 0 10*3/uL (ref 0.0–0.1)
Basophils Relative: 1 %
Eosinophils Absolute: 0.4 10*3/uL (ref 0.0–0.5)
Eosinophils Relative: 9 %
HCT: 37.2 % (ref 36.0–46.0)
Hemoglobin: 12.3 g/dL (ref 12.0–15.0)
Immature Granulocytes: 0 %
Lymphocytes Relative: 30 %
Lymphs Abs: 1.5 10*3/uL (ref 0.7–4.0)
MCH: 30.4 pg (ref 26.0–34.0)
MCHC: 33.1 g/dL (ref 30.0–36.0)
MCV: 92.1 fL (ref 80.0–100.0)
Monocytes Absolute: 0.6 10*3/uL (ref 0.1–1.0)
Monocytes Relative: 13 %
Neutro Abs: 2.2 10*3/uL (ref 1.7–7.7)
Neutrophils Relative %: 47 %
Platelets: 210 10*3/uL (ref 150–400)
RBC: 4.04 MIL/uL (ref 3.87–5.11)
RDW: 15 % (ref 11.5–15.5)
WBC: 4.8 10*3/uL (ref 4.0–10.5)
nRBC: 0 % (ref 0.0–0.2)

## 2019-07-11 LAB — COMPREHENSIVE METABOLIC PANEL
ALT: 14 U/L (ref 0–44)
AST: 16 U/L (ref 15–41)
Albumin: 2.9 g/dL — ABNORMAL LOW (ref 3.5–5.0)
Alkaline Phosphatase: 41 U/L (ref 38–126)
Anion gap: 10 (ref 5–15)
BUN: 14 mg/dL (ref 8–23)
CO2: 31 mmol/L (ref 22–32)
Calcium: 9.1 mg/dL (ref 8.9–10.3)
Chloride: 99 mmol/L (ref 98–111)
Creatinine, Ser: 0.74 mg/dL (ref 0.44–1.00)
GFR calc Af Amer: >60 mL/min (ref >60)
GFR calc non Af Amer: >60 mL/min (ref >60)
Glucose, Bld: 97 mg/dL (ref 70–99)
Potassium: 4 mmol/L (ref 3.5–5.1)
Sodium: 140 mmol/L (ref 135–145)
Total Bilirubin: 0.3 mg/dL (ref 0.3–1.2)
Total Protein: 5.1 g/dL — ABNORMAL LOW (ref 6.5–8.1)

## 2019-07-11 NOTE — Progress Notes (Signed)
Speech Language Pathology Daily Session Note  Patient Details  Name: Tanya Knight MRN: [id number] Date of Birth: Dec 17, 1940  Today's Date: 07/11/2019 SLP Individual Time: [id number] SLP Individual Time Calculation (min): 45 min  Short Term Goals: Week 4: SLP Short Term Goal 1 (Week 4): STG=LTG due to remaining length of stay  Skilled Therapeutic Interventions: Pt was seen for skilled ST targeting education with pt and her son, as well as cognitive interventions. SLP facilitated session with functional conversation regarding pt's goals and progress in ST throughout rehab admission. Handout and verbal review or compensatory memory strategies provided, with emphasis on notetaking, routines, and "everything has its' place". SLP also reinforced recommendation for 24/7 supervision at discharge and assistance with medication and finance management as well as complex cooking, which pt and son verbally acknowledged. Questions regarding home activities to assist with cognition answered to pt and son's satisfaction. SLP also began readministering Cerebellar Cognitive Affective Syndrome Scale (CCAS-Scale version 1.A), to assess improvement on standardized testing since admission. However, due to time constraints readministration was not finished; will complete at next session. Pt left sitting in chair with alarm set and needs within reach, son still present. Continue per current plan of care.       Pain Pain Assessment Pain Scale: 0-10 Pain Score: 0-No pain  Therapy/Group: Individual Therapy  Arbutus Leas 07/11/2019, 3:11 PM

## 2019-07-11 NOTE — Progress Notes (Signed)
[place] PHYSICAL MEDICINE & REHABILITATION PROGRESS NOTE   Subjective/Complaints: Has some nausea and headache this morning. Has emesis x1 this morning.  Requests Tylenol. Very pleasant and appears improved since I last saw her.   Objective:   No results found. Recent Labs    07/11/19 0545  WBC 4.8  HGB 12.3  HCT 37.2  PLT 210   Recent Labs    07/11/19 0545  NA 140  K 4.0  CL 99  CO2 31  GLUCOSE 97  BUN 14  CREATININE 0.74  CALCIUM 9.1    Intake/Output Summary (Last 24 hours) at 07/11/2019 1227 Last data filed at 07/10/2019 1900 Gross per 24 hour  Intake 240 ml  Output --  Net 240 ml     Physical Exam: Vital Signs Blood pressure 123/73, pulse (!) 57, temperature (!) 97.4 F (36.3 C), temperature source Oral, resp. rate 16, height 5\' 1"  (1.549 m), weight 52.8 kg, SpO2 97 %.   Constitutional: No distress . Vital signs reviewed. Lying in bed with eyes closed, easily arousable.  HEENT: EOMI, oral membranes moist Neck: supple Cardiovascular: RRR without murmur. No JVD    Respiratory/Chest: CTA Bilaterally without wheezes or rales. Normal effort    GI/Abdomen: BS +, non-tender, non-distended Ext: no clubbing, cyanosis, or edema Skin: No evidence of breakdown, no evidence of rash Neurologic: motor strength is 4/5 in bilateral deltoid, bicep, tricep, grip, hip flexor, knee extensors, ankle dorsiflexor and plantar flexor--some diplopia and depth perception issues Cerebellar exam minimal  dysmetria Left l finger to nose to finger Musculoskeletal: normal rom   Assessment/Plan: 1. Functional deficits secondary to Cerebellar vermis ICH which require 3+ hours per day of interdisciplinary therapy in a comprehensive inpatient rehab setting.  Physiatrist is providing close team supervision and 24 hour management of active medical problems listed below.  Physiatrist and rehab team continue to assess barriers to discharge/monitor patient progress toward functional and  medical goals  Care Tool:  Bathing     Body parts bathed by patient: Right arm, Left arm, Chest, Abdomen, Front perineal area, Right upper leg, Left upper leg, Face, Right lower leg, Left lower leg, Buttocks   Body parts bathed by helper: Right lower leg, Left lower leg, Buttocks Body parts n/a: Abdomen, Chest(did not attempt this session)   Bathing assist Assist Level: Contact Guard/Touching assist     Upper Body Dressing/Undressing Upper body dressing   What is the patient wearing?: Pull over shirt    Upper body assist Assist Level: Set up assist    Lower Body Dressing/Undressing Lower body dressing      What is the patient wearing?: Pants     Lower body assist Assist for lower body dressing: Contact Guard/Touching assist     Toileting Toileting    Toileting assist Assist for toileting: Contact Guard/Touching assist     Transfers Chair/bed transfer  Transfers assist  Chair/bed transfer activity did not occur: Safety/medical concerns  Chair/bed transfer assist level: Contact Guard/Touching assist     Locomotion Ambulation   Ambulation assist   Ambulation activity did not occur: Safety/medical concerns  Assist level: Contact Guard/Touching assist Assistive device: Walker-rolling Max distance: 40'   Walk 10 feet activity   Assist  Walk 10 feet activity did not occur: Safety/medical concerns  Assist level: Contact Guard/Touching assist Assistive device: Walker-rolling   Walk 50 feet activity   Assist Walk 50 feet with 2 turns activity did not occur: Safety/medical concerns  Assist level: Minimal Assistance - Patient >  75% Assistive device: No Device    Walk 150 feet activity   Assist Walk 150 feet activity did not occur: Safety/medical concerns         Walk 10 feet on uneven surface  activity   Assist Walk 10 feet on uneven surfaces activity did not occur: Safety/medical concerns         Wheelchair     Assist Will  patient use wheelchair at discharge?: No(Not anticipated; no LT goals set)   Wheelchair activity did not occur: Safety/medical concerns         Wheelchair 50 feet with 2 turns activity    Assist    Wheelchair 50 feet with 2 turns activity did not occur: Safety/medical concerns       Wheelchair 150 feet activity     Assist  Wheelchair 150 feet activity did not occur: Safety/medical concerns       Blood pressure 123/73, pulse (!) 57, temperature (!) 97.4 F (36.3 C), temperature source Oral, resp. rate 16, height 5\' 1"  (1.549 m), weight 52.8 kg, SpO2 97 %.  Medical assessment and plan:  1.  Truncal ataxia and central vestibular dysfunction secondary to intracranial hemorrhage, cerebellar vermis with SDH and SAH likely related to hypertensive crisis-    Continue CIR PT, OT, SLP, d/c date 3/3  -outpt ophtho assessment  -working through vestibular symptoms as possible with OT/PT 2.  Antithrombotics: -DVT/anticoagulation: Continue Subcutaneous heparin, initiated 06/11/2019             -antiplatelet therapy: N/A 3. Pain Management: Continue Tramadol as needed, Fioricet as needed for headaches. Well controlled. 4. Mood: In good spirits             -antipsychotic agents: N/A 5. Neuropsych: This patient is NOT capable of making decisions on her own behalf. 6. Skin/Wound Care: Continue routine skin checks 7. Fluids/Electrolytes/Nutrition: Routine in and outs. 8.  Hypertension.  Norvasc 10 mg daily, Toprol-XL 50 mg daily.  Monitor with increased mobility- monitor closely.    Vitals:   07/10/19 1921 07/11/19 0415  BP: 123/77 123/73  Pulse: 65 (!) 57  Resp: 17 16  Temp: 97.6 F (36.4 C) (!) 97.4 F (36.3 C)  SpO2: 97% 97%   Controlled on 2/28, 3/1 9.  History of seizure disorder.  Continue Depakote 500 mg every 12 hours  Valproic acid within normal limits on 2/16 10. Diplopia- suggest eye patch intermittently to help with double vision 11. Hypokalemia  Resolved    BMP Latest Ref Rng & Units 07/11/2019 07/04/2019 06/27/2019  Glucose 70 - 99 mg/dL 97 104(H) 105(H)  BUN 8 - 23 mg/dL 14 24(H) 31(H)  Creatinine 0.44 - 1.00 mg/dL 0.74 0.79 0.66  Sodium 135 - 145 mmol/L 140 141 138  Potassium 3.5 - 5.1 mmol/L 4.0 3.8 3.9  Chloride 98 - 111 mmol/L 99 104 107  CO2 22 - 32 mmol/L 31 25 20(L)  Calcium 8.9 - 10.3 mg/dL 9.1 9.6 9.1  12. Vascular HA:  Continue Tylenol as needed- working well   Controlled on 2/28, 3/1 13. Nausea/+/- vomiting-  Improved PRN Zofran, receiving daily 14 GERD: Unchanged PPI to H2 blocker to help with Mg+++ absorption  14. Dysphagia due to CVA   D3 thins, advance as tolerated 15.  Elevated BUN  Trending up on 2/15, normalizing with improved intake   Encourage fluids- only ~375ml recorded per day 16.  Central vestibular disorder, symptomatic at times with dizziness will trial meclizine and monitor for nausea, receiving daily 17.  Insomnia trial trazodone---improvement    LOS: 27 days A FACE TO FACE EVALUATION WAS PERFORMED  Tanya Knight 07/11/2019, 12:27 PM

## 2019-07-11 NOTE — Progress Notes (Addendum)
Physical Therapy Session Note  Patient Details  Name: Tanya Knight MRN: [id number] Date of Birth: 20-Dec-1940  Today's Date: 07/11/2019 PT Individual Time: 1431-1516 PT Individual Time Calculation (min): 45 min    Short Term Goals: Week 4:  PT Short Term Goal 1 (Week 4): STG = LTG d/t ELOS  Skilled Therapeutic Interventions/Progress Updates:    Patient seated in w/c upon PT arrival, agreeable to therapy tx, denies pain but states upset stomach. Therapist asked pt if wanting therpaist to ask RN for anti-nausea medication, pt reported that it was too soon to take it, therapist offered asking at end of session if nausea persisted however was not needed by end of session d/t no c/o from pt. Pt's son present for family education today - with each task, therapist performed first giving cues to pt's son and then allowed pt's son to guard pt and ask questions. Sit > stand at [facility] with CGA, pt then ambulated 241' to rehab apartment with CGA-minA from therapist transitioned to pt's son providing same level of assistance for RW management and cues for upright posture and positioning in RW. Stand pivot transfer with RW and CGA-minA from pt's son for RW management and cues for positioning in RW > sitting in recliner CGA and cues for sequencing. Sitting in recliner > standing at [place] with minA d/t low, unstable chair > pt ambulated 15' to w/c with CGA. Pt transported in w/c to ortho gym for car transfer. Therapist provided cueing to pt and pt's son through stand pivot transfer with RW in/out of car while pt's son provided CGA throughout. Therapist demonstrated how to collapse RW and what a transport chair looks like to pt's son d/t that type of equipment being sent home with pt at discharge. Pt transported in w/c to rehab gym for time management and energy conservation. Pt ascended/descended 4 steps with therapist providing CGA-minA for balance, therapist giving cues to pt's son about safe positioning and guarding. Pt  repeated stair negotiation with pt's son guarding. Pt demonstrated alt pattern when ascending and step-to when descending, using R railing only. Pt transported in w/c back to room d/t fatigue. Once in room, sit > stand without RW with CGA > pt ambulated 8' to recliner with minA + HHA. Therapist asked pt and pt's son if they had any questions, pt's son inquired about HHPT and OPPT length/frequency, therapist discussed with pt's son how scheduling varies. Pt left in recliner with needs in reach and chair alarm set.   Therapy Documentation Precautions:  Precautions Precautions: Fall Precaution Comments: visual nystagmus Restrictions Weight Bearing Restrictions: No    Therapy/Group: Individual Therapy  [name] SPT 07/11/2019, 7:28 AM

## 2019-07-11 NOTE — Discharge Summary (Signed)
Physician Discharge Summary  Patient ID: Tanya Knight MRN: [id number] DOB/AGE: [birth year] 79 y.o.  Admit date: 06/14/2019 Discharge date: 07/13/2019  Discharge Diagnoses:  Principal Problem:   Cerebellar vermis ICH with SDH and SAH, likely hypertensive Active Problems:   Dysphagia, post-stroke   Vascular headache   Hypokalemia   Seizures (HCC)   Essential hypertension   Elevated BUN GERD Central vestibular disorder  Discharged Condition: Stable  Significant Diagnostic Studies: DG Chest 2 View  Result Date: 06/15/2019 CLINICAL DATA:  Lethargy. EXAM: CHEST - 2 VIEW COMPARISON:  07/18/2015.  CT chest 01/05/2015. FINDINGS: Patient is rotated. Trachea is midline. Heart size is accentuated by AP technique. Scarring in the lingula. Favor superimposition of shadows in the right costophrenic angle. Lungs are otherwise clear. No pleural fluid. Old right rib fracture. Mid and lower thoracic and upper lumbar compression fractures are again noted. IMPRESSION: No acute findings. Electronically Signed   By: Lorin Picket M.D.   On: 06/15/2019 10:12   CT HEAD WO CONTRAST  Result Date: 06/15/2019 CLINICAL DATA:  Follow-up stroke EXAM: CT HEAD WITHOUT CONTRAST TECHNIQUE: Contiguous axial images were obtained from the base of the skull through the vertex without intravenous contrast. COMPARISON:  06/12/2019, 06/10/2019 FINDINGS: Brain: No significant interval change in a large intraparenchymal hemorrhage of the midline cerebellum with adjacent edema, which nearly effaces the fourth ventricle. Small volume subarachnoid hemorrhage in the posterior fossa and about the inferior occipital lobes. There is no significant interval change in mildly dilated appearance of the lateral ventricles, however there is perhaps slight interval increase in periventricular white matter hypodensity, suggesting transependymal edema. Caliber of the ventricles is somewhat increased in comparison to initial presentation CT dated  06/10/2019. Incidental note of cavum septum pellucidum et vergae variant of the lateral ventricles. Vascular: No hyperdense vessel or unexpected calcification. Skull: Normal. Negative for fracture or focal lesion. Sinuses/Orbits: No acute finding. Other: None. IMPRESSION: 1. No significant interval change in large intraparenchymal hemorrhage of the midline cerebellum with adjacent edema, which nearly effaces the fourth ventricle. 2. There is no significant interval change in mildly dilated appearance of the lateral ventricles, however there is perhaps slight interval increase in periventricular white matter hypodensity, suggesting transependymal edema. Caliber of the ventricles is somewhat increased in comparison to initial presentation CT dated 06/10/2019. Findings are concerning for obstructive hydrocephalus. 3. Small volume subarachnoid hemorrhage in the posterior fossa and about the inferior occipital lobes, unchanged. Electronically Signed   By: Eddie Candle M.D.   On: 06/15/2019 14:11    Labs:  Basic Metabolic Panel: Recent Labs  Lab 07/11/19 0545  NA 140  K 4.0  CL 99  CO2 31  GLUCOSE 97  BUN 14  CREATININE 0.74  CALCIUM 9.1    CBC: Recent Labs  Lab 07/11/19 0545  WBC 4.8  NEUTROABS 2.2  HGB 12.3  HCT 37.2  MCV 92.1  PLT 210    CBG: No results for input(s): GLUCAP in the last 168 hours. Family history.  Mother with hypertension.  Father with CAD.  Daughter with breast cancer.  Paternal aunt with diabetes mellitus, negative for colon cancer, stomach cancer, pancreatic cancer rectal cancer  Brief HPI:   Tanya Knight is a 79 y.o. right-handed female with history of hypertension, seizure disorder maintained on Depakote.  Per chart review lives with spouse independent prior to admission.  Presented 06/10/2019 with headache, nausea and vomiting as well as weakness and slurred speech.  Cranial CT scan showed a 16 cm hematoma  centered in the cerebellar vermis with mild subdural and  subarachnoid extension.  No hydrocephalus.  Blood pressure [id number] with systolic increasing into the 160s and started on Cleviprex.  Patient had been on aspirin prior to admission held due to hematoma.  Admission chemistry sodium 134, potassium 3.2, urine drug screen negative, WBC 11,900.  CT angiogram showed no posterior fossa aneurysm or AVM identified.  Follow-up CT/MRI showed no evidence of additional cerebellar hemorrhage.  Echocardiogram with ejection fraction of 60% without emboli.  Neurology follow-up close monitoring of blood pressure.  Subcutaneous heparin for DVT prophylaxis initiated 06/11/2019.  Maintain on Depakote his prior to admission.  Tolerating a regular diet.  Patient was admitted for a comprehensive rehab program.  Follow-up CT scan of the head 06/15/2019 due to some increased lethargy showed no significant interval change and large intraparenchymal hemorrhage of the midline cerebellum with adjacent edema.   Hospital Course: Tanya Knight was admitted to rehab 06/14/2019 for inpatient therapies to consist of PT, ST and OT at least three hours five days a week. Past admission physiatrist, therapy team and rehab RN have worked together to provide customized collaborative inpatient rehab.  Pertaining to patient's central vestibular dysfunction secondary to intracranial hemorrhage, cerebellar vermis with SDH and SAH likely related to hypertensive crisis.  Patient was slow progressive gains while on rehab services.  She would follow-up with neurology services.  Maintained on subcutaneous heparin for DVT prophylaxis initiated 06/11/2019.  She remained on Depakote his prior to admission no seizure activity.  Blood pressure control with Norvasc 10 mg daily as well as Toprol-XL 50 mg daily.  Patient follow-up primary MD.  Bouts of hypokalemia resolved with potassium supplement and latest potassium 4.0.  She was tolerating a regular diet.  In regards to patient's central vestibular disorder symptomatic  with dizziness placed on a trial of meclizine.   Blood pressures were monitored on TID basis and controlled  /She is receiving routine toileting for bowel and bladder.  Tanya Knight has made gains during rehab stay and is attending therapies  Tanya Knight will continue to receive follow up therapies   after discharge  Rehab course: During patient's stay in rehab weekly team conferences were held to monitor patient's progress, set goals and discuss barriers to discharge. At admission, patient required minimal assistance to ambulate 10 feet rolling walker, minimal assist sit to stand, minimal guard supine to sit.  Minimal guard upper body bathing moderate assist lower body bathing minimal guard upper body dressing set up lower body dressing  Physical exam.  Blood pressure 140/60 pulse 60 temperature 98.3 respirations 18 oxygen saturation 98% room air Constitutional.  Frail-appearing female speech was slow slightly slurred. HEENT Head.  Normocephalic and atraumatic Right ear.  External ear canal normal Left ear external ear canal normal Eyes.  Pupils round and reactive to light no discharge without nystagmus Neck.  Supple nontender no JVD without thyromegaly Cardiac regular rate rhythm without any extra sounds or murmur heard Respiratory effort normal no respiratory distress without wheeze Abdomen.  Soft nontender positive bowel sounds without rebound Musculoskeletal. Cervical back.  Normal range of motion neck supple Appears to be at least 4- out of 5 in upper extremities bilateral lower extremities 4- out of 5 although difficult to assess at times.  Tanya Knight  has had improvement in activity tolerance, balance, postural control as well as ability to compensate for deficits. Tanya Knight has had improvement in functional use RUE/LUE  and RLE/LLE as well as improvement in awareness.  Working with  energy conservation techniques.  Perform standing balance with focus on item on wall for improved dizziness.  Ambulates  multiple trials rolling walker contact-guard assist to minimal assist depending on level of fatigue.  Required some verbal cues for safe turns.  She was able to transition to edge of bed for lower body dressing task which were completed with minimal guard assist sit to stand.  She was able to complete grooming task of brushing her teeth and standing at the sink with minimal guard assist as well.  She was taken to the ADL apartment where therapist reviewed proper techniques for transferring into and out of the walk-in shower.  She was then able to complete with minimal assist using rolling walker and stepping and backwards and turned to the seat with simulated grab bars.  Speech therapy facilitated sessions with functional conversation regarding patient's goals and progress.  Handout and verbal review of compensatory memory strategies provided with patient and family.  Recommendations remain 24-hour supervision for safety.  Full family teaching completed plan discharge to home       Disposition: Discharge to home    Diet: Regular  Special Instructions: No driving smoking or alcohol  No aspirin products  Medications at discharge 1.  Tylenol as needed 2.  Norvasc 10 mg p.o. daily 3.  Depakote 500 mg p.o. every 12 hours 4.  Pepcid 20 mg p.o. nightly 5.  Magnesium oxide 400 mg p.o. 3 times daily 6.  Antivert 12.5 mg p.o. twice daily as needed dizziness 7.  Toprol-XL 50 mg p.o. daily 8.  Multivitamin 1 tablet daily 9.  Senokot S1 tablet p.o. twice daily hold for loose stools  Discharge Instructions    Ambulatory referral to Neurology   Complete by: As directed    An appointment is requested in approximately 4 weeks ICH/SDH/SAH   Ambulatory referral to Physical Medicine Rehab   Complete by: As directed    Moderate complexity follow-up 1 to 2 weeks ICH/SDH/SAH      Follow-up Information    Lovorn, Jinny Blossom, MD Follow up.   Specialty: Physical Medicine and Rehabilitation Why: Office to  call for appointment Contact information: [id number] [place] [address] [phone]           Signed: Cathlyn Parsons 07/13/2019, 5:27 AM

## 2019-07-11 NOTE — Progress Notes (Signed)
Occupational Therapy Session Note  Patient Details  Name: LAFREDA OUIMETTE MRN: [id number] Date of Birth: 1941-03-08  Today's Date: 07/11/2019 OT Individual Time: 1255-1330 OT Individual Time Calculation (min): 35 min   Skilled Therapeutic Interventions/Progress Updates:    Pt greeted in bed with no c/o pain. Reviewed vestibular exercise program together before spouse arrived. Utilized teach back to assess understanding as she explained and demonstrated her exercises for spouse Pilar Plate. Pt required min vcs for accuracy. Gave paper handout to Pilar Plate who was also writing down notes using his personal writing pad. Pt then needed to use the restroom. Discussed with Pilar Plate to always provide CGA assistance anytime when pt stood and to don the gait belt. Pilar Plate provided this assist while pt completed toilet transfer using RW, toileting tasks, and also LB dressing tasks sit<stand from EOB. OT also had pt and spouse practice simulated shower transfers using ledge and chair for shower. Discussed purchase of nonslip shower treads for the floor and also wearing nonslip footwear during transfers. At end of session pt transferred to the w/c and was left with all needs and safety belt fastened, handed off to SLP for continued family education.   Therapy Documentation Precautions:  Precautions Precautions: Fall Precaution Comments: visual nystagmus Restrictions Weight Bearing Restrictions: No Pain: Pain Assessment Pain Scale: 0-10 Pain Score: 0-No pain ADL: ADL Grooming: Moderate assistance(secondary to lethargy) Where Assessed-Grooming: Edge of bed Upper Body Bathing: Moderate assistance Where Assessed-Upper Body Bathing: Edge of bed Lower Body Bathing: Dependent Where Assessed-Lower Body Bathing: Edge of bed Upper Body Dressing: Dependent Where Assessed-Upper Body Dressing: Edge of bed Lower Body Dressing: Dependent Where Assessed-Lower Body Dressing: Edge of bed Toileting: Dependent Where  Assessed-Toileting: Bedside Commode      Therapy/Group: Individual Therapy  Kevan Prouty A Rivers Gassmann 07/11/2019, 4:28 PM

## 2019-07-12 ENCOUNTER — Inpatient Hospital Stay (HOSPITAL_COMMUNITY): Payer: Medicare PPO | Admitting: Speech Pathology

## 2019-07-12 ENCOUNTER — Inpatient Hospital Stay (HOSPITAL_COMMUNITY): Payer: Medicare PPO | Admitting: Occupational Therapy

## 2019-07-12 ENCOUNTER — Inpatient Hospital Stay (HOSPITAL_COMMUNITY): Payer: Medicare PPO

## 2019-07-12 MED ORDER — METOPROLOL SUCCINATE ER 50 MG PO TB24: 50.0000 mg | ORAL_TABLET | Freq: Every day | ORAL | 0 refills | Status: DC

## 2019-07-12 MED ORDER — MECLIZINE HCL 12.5 MG PO TABS: 12.5000 mg | ORAL_TABLET | Freq: Two times a day (BID) | ORAL | 0 refills | Status: DC | PRN

## 2019-07-12 MED ORDER — ACETAMINOPHEN 325 MG PO TABS: 650.0000 mg | ORAL_TABLET | ORAL | Status: DC | PRN

## 2019-07-12 MED ORDER — AMLODIPINE BESYLATE 10 MG PO TABS: 10.0000 mg | ORAL_TABLET | Freq: Every day | ORAL | 0 refills | Status: DC

## 2019-07-12 MED ORDER — FAMOTIDINE 20 MG PO TABS: 20.0000 mg | ORAL_TABLET | Freq: Every day | ORAL | 0 refills | Status: DC

## 2019-07-12 MED ORDER — DIVALPROEX SODIUM 500 MG PO DR TAB: 500.0000 mg | DELAYED_RELEASE_TABLET | Freq: Two times a day (BID) | ORAL | 0 refills | Status: DC

## 2019-07-12 MED ORDER — VITAMIN D 1000 UNITS PO TABS: 1000.0000 [IU] | ORAL_TABLET | Freq: Every day | ORAL | 0 refills | Status: DC

## 2019-07-12 MED ORDER — MAGNESIUM OXIDE 400 (241.3 MG) MG PO TABS: 400.0000 mg | ORAL_TABLET | Freq: Three times a day (TID) | ORAL | 0 refills | Status: DC

## 2019-07-12 MED ORDER — PANTOPRAZOLE SODIUM 40 MG PO TBEC: 40.0000 mg | DELAYED_RELEASE_TABLET | Freq: Every day | ORAL | 0 refills | Status: DC

## 2019-07-12 NOTE — Plan of Care (Signed)
  Problem: Consults Goal: RH STROKE PATIENT EDUCATION Description: See Patient Education module for education specifics  Outcome: Completed/Met   Problem: RH BOWEL ELIMINATION Goal: RH STG MANAGE BOWEL WITH ASSISTANCE Description: STG Manage Bowel with mod I Assistance. Outcome: Completed/Met Goal: RH STG MANAGE BOWEL W/MEDICATION W/ASSISTANCE Description: STG Manage Bowel with Medication with mod I Assistance. Outcome: Completed/Met   Problem: RH BLADDER ELIMINATION Goal: RH STG MANAGE BLADDER WITH ASSISTANCE Description: STG Manage Bladder With mod I Assistance Outcome: Completed/Met   Problem: RH SKIN INTEGRITY Goal: RH STG SKIN FREE OF INFECTION/BREAKDOWN Description: Patients skin will remain free from further infection or breakdown with mod I assist. Outcome: Completed/Met Goal: RH STG MAINTAIN SKIN INTEGRITY WITH ASSISTANCE Description: STG Maintain Skin Integrity With mod I Assistance. Outcome: Completed/Met Goal: RH STG ABLE TO PERFORM INCISION/WOUND CARE W/ASSISTANCE Description: STG Able To Perform Incision/Wound Care With mod I Assistance. Outcome: Completed/Met   Problem: RH SAFETY Goal: RH STG ADHERE TO SAFETY PRECAUTIONS W/ASSISTANCE/DEVICE Description: STG Adhere to Safety Precautions With mod I Assistance/Device. Outcome: Completed/Met   Problem: RH KNOWLEDGE DEFICIT Goal: RH STG INCREASE KNOWLEDGE OF HYPERTENSION Description: Patient/caregiver will verbalize understanding of management of HTN including diet, exercise, medications, monitoring, and follow up care with min assist. Outcome: Completed/Met Goal: RH STG INCREASE KNOWLEGDE OF HYPERLIPIDEMIA Description: Patient/caregiver will verbalize understanding of management of HLD including diet, exercise, medications, monitoring, and follow up care with min assist. Outcome: Completed/Met Goal: RH STG INCREASE KNOWLEDGE OF STROKE PROPHYLAXIS Description: Patient/caregiver will verbalize understanding of  management of stroke prophylaxis including diet, exercise, medications, monitoring, and follow up care with min assist. Outcome: Completed/Met

## 2019-07-12 NOTE — Progress Notes (Signed)
Occupational Therapy Discharge Summary  Patient Details  Name: Tanya Knight MRN: [id number] Date of Birth: Aug 26, 1940   Patient has met 70 of 61 long term goals due to improved activity tolerance, improved balance, postural control, ability to compensate for deficits, improved attention, improved awareness and improved coordination.  Patient to discharge at overall Supervision to CGA  level.  Patient's care partner is independent to provide the necessary physical and cognitive assistance at discharge.    Reasons goals not met: n/a  Recommendation:  Patient will benefit from ongoing skilled OT services in home health setting to continue to advance functional skills in the area of BADL.  Equipment: BSC  Reasons for discharge: treatment goals met  Patient/family agrees with progress made and goals achieved: Yes  OT Discharge ADL ADL Eating: Set up Grooming: Supervision/safety Where Assessed-Grooming: Standing at sink Upper Body Bathing: Setup Where Assessed-Upper Body Bathing: Shower Lower Body Bathing: Contact guard Where Assessed-Lower Body Bathing: Shower Upper Body Dressing: Setup Where Assessed-Upper Body Dressing: Chair Lower Body Dressing: Contact guard Where Assessed-Lower Body Dressing: Chair Toileting: Contact guard Where Assessed-Toileting: Glass blower/designer: Therapist, music Method: Product/process development scientist Method: Heritage manager: Grab bars, Gaffer Additional Comments: decreased saddacic eye movements and oculomotor strength Perception  Perception: Within Functional Limits Praxis Praxis: Intact Cognition Overall Cognitive Status: Impaired/Different from baseline Orientation Level: Oriented X4 Attention: Selective Focused Attention: Impaired Sustained Attention: Appears intact Memory: Impaired Memory Impairment: Decreased short term memory;Storage  deficit;Retrieval deficit Decreased Short Term Memory: Verbal basic;Functional complex Awareness: Impaired Awareness Impairment: Emergent impairment Problem Solving: Impaired Problem Solving Impairment: Verbal complex;Functional basic Executive Function: Organizing;Initiating Reasoning: Appears intact Reasoning Impairment: Verbal basic Organizing: Impaired Organizing Impairment: Verbal basic;Functional basic Initiating: Appears intact Self Monitoring: Impaired Self Monitoring Impairment: Verbal basic;Functional basic Safety/Judgment: Appears intact Sensation Sensation Light Touch: Appears Intact Hot/Cold: Appears Intact Proprioception: Appears Intact Stereognosis: Appears Intact Coordination Gross Motor Movements are Fluid and Coordinated: Yes Fine Motor Movements are Fluid and Coordinated: Yes Motor  Motor Motor: Abnormal postural alignment and control Motor - Discharge Observations: postural instability noted with dynamic standing/gait longer distances when pt becomes fatigued, dec postural awareness requiring cues   Balance Static Sitting Balance Static Sitting - Level of Assistance: 7: Independent Dynamic Sitting Balance Dynamic Sitting - Level of Assistance: 5: Stand by assistance Static Standing Balance Static Standing - Level of Assistance: 5: Stand by assistance Dynamic Standing Balance Dynamic Standing - Level of Assistance: 4: Min assist Extremity/Trunk Assessment RUE Assessment RUE Assessment: Within Functional Limits LUE Assessment LUE Assessment: Within Functional Limits   [name] 07/12/2019, 12:38 PM

## 2019-07-12 NOTE — Progress Notes (Signed)
Speech Language Pathology Discharge Summary  Patient Details  Name: Tanya Knight MRN: [id number] Date of Birth: 04-04-1941  Today's Date: 07/12/2019 SLP Individual Time: 6606-3016 SLP Individual Time Calculation (min): 30 min   Skilled Therapeutic Interventions:  Pt was seen for skilled ST targeting cognitive goals. Upon arrival, pt requested to use restroom, therefore SLP assisted with 1 verbal cue for safety awareness with use of walker (staying inside walker). Once in the restroom she successfully voided. Once seated back in recliner, pt used memory notebook to recall daily events and requested SLP record information from session Mod I. SLP finished re-administration of CCAS-Scale (Cerebellar Cognitive Affective Schmahmann Syndrome Scale) assessment (started at end of yesterday's session), which pt received a standard score of 4/10. Although this score still indicates "definite CCAS" according to standardized scoring system of this test, pt's score today was improved by 4 points in comparison to score at admission. She is alos noted to have performed with much greater accuracy (although not sufficient to receive the point) throughout other subtests within exam. Most noteable improvements in areas of abstract thinking, selective attention, verbal recall (with cues), and cube copy. Results reviewed with pt in detail. Pt left sitting in recliner with seatbelt alarm in place and needs met to her satisfaction. Continue per current plan of care.      Patient has met 6 of 6 long term goals.  Patient to discharge at overall Supervision level.  Reasons goals not met: n/a   Clinical Impression/Discharge Summary:   Once pt's medical stability and ability to maintain arousal improved, pt made excellent functional gains and ultimately met 6 out of 6 long term goals this admission. Pt currently requires Supervision assist for basic cognitive tasks and will require 24/7 supervision at discharge. Pt is  consuming regular diet with thin liquids and is independent with safe swallowing and GERD precautions. Pt has demonstrated improved orientation, basic problem solving, short term recall with use of compensatory strategies, as well as intellectual and emergent awareness. Her speech intelligibility also significantly improved; she is independent with use of increased vocal intensity to achieve 100% intelligibility at the conversation level now. However, given mild cognitive deficits (primarily in the areas of problem solving and recall) still present, recommend pt continue to receive skilled ST services upon discharge. Pt and family education is complete at this time.    Care Partner:  Caregiver Able to Provide Assistance: Yes  Type of Caregiver Assistance: Cognitive  Recommendation:  Home Health SLP;24 hour supervision/assistance  Rationale for SLP Follow Up: Maximize cognitive function and independence;Reduce caregiver burden   Equipment: none   Reasons for discharge: Discharged from hospital   Patient/Family Agrees with Progress Made and Goals Achieved: Yes    Arbutus Leas 07/12/2019, 7:15 AM

## 2019-07-12 NOTE — Progress Notes (Signed)
Physical Therapy Discharge Summary  Patient Details  Name: Tanya Knight MRN: [id number] Date of Birth: 09-Feb-1941  Today's Date: 07/12/2019 PT Individual Time: 1345-1445 PT Individual Time Calculation (min): 60 min    Patient has met 9 of 9 long term goals due to improved activity tolerance, improved balance, improved postural control, increased strength, improved attention, improved awareness and improved coordination.  Patient to discharge at an ambulatory level Supervision.   Patient's care partner is independent to provide the necessary physical assistance at discharge.  Reasons goals not met: N/A  Recommendation:  Patient will benefit from ongoing skilled PT services in home health setting to continue to advance safe functional mobility, address ongoing impairments in standing balance, strength, coordination, postural control, gait, and minimize fall risk.  Equipment: transport chair and RW  Reasons for discharge: treatment goals met  Patient/family agrees with progress made and goals achieved: Yes  Skilled Physical Therapy Interventions Patient seated in recliner upon PT arrival, agreeable to therapy tx, reports 8/10 HA. Therapist asked if pt wanted RN to bring tylenol for the HA, pt reported yes, therapist communicated with RN. RN in/out to administer medication. Sit > standing at [facility] with supervision. Pt ambulated 200' with RW with supervision into rehab apartment. Stand pivot with RW > recliner with supervision. Sit > stand from recliner with supervision, cues for sequencing and hand positioning for push off. Stand pivot transfer with RW to bed > stand > sit > supine with supervision only. Pt performed self B QL stretch while supine in bed. Pt performed rolling R<>L with supervision, supine > sitting EOB with supervision. Sit > standing at RW, pt ambulated 10' to w/c > stand > sit with supervision. Pt transported to ortho gym in w/c for energy conservation. Pt performed car transfer  with supervision, cues for sequencing and to reach back to car prior to sitting down. Pt ambulated up/down ramp with RW with CGA-minA for safety and RW management, cues for positioning in walker and slower speed. Pt performed picking up an object from the ground while standing at [facility] with CGA for safety. Pt transported to rehab gym in w/c for energy conservation. Pt ascended (alt pattern)/descended (step-to) 12 steps with CGA. Stand pivot to w/c with supervision. Pt transported to other side of gym in w/c for energy conservation. Stand pivot transfer to mat table with supervision. Therapist performed strength and sensation testing (see below). Therapist donned maxisky harness totalA while pt in standing. The following exercises were performed while using the maxisky:  - fwd gait x 1 lap  - backwards gait x 1 lap  - side stepping x 1 lap each direction  - anticipatory balance strategies with therapist providing perturbations multiple bouts in all directions; pt able to take multiple steps to recover forward however had numerous posterior LOB d/t unable to react with quick step back   Stand pivot transfer to w/c with RW and supervision. Pt transported in w/c back to room d/t fatigue. Stand pivot transfer with RW > recliner with supervision. Pt left seated in recliner with needs in reach and chair alarm set.    PT Discharge Precautions/Restrictions Precautions Precautions: Fall Precaution Comments: global weakness and postural instability, visual/vestibular involvement Restrictions Weight Bearing Restrictions: No Cognition Overall Cognitive Status: Impaired/Different from baseline Arousal/Alertness: Awake/alert Orientation Level: Oriented X4 Attention: Selective Sustained Attention: Appears intact Memory: Impaired Awareness: Impaired Safety/Judgment: Appears intact Sensation Sensation Light Touch: Appears Intact Proprioception: Impaired by gross assessment Additional Comments: BUE and BLE  sensation intact  to light touch Coordination Gross Motor Movements are Fluid and Coordinated: Yes Fine Motor Movements are Fluid and Coordinated: Yes Coordination and Movement Description: very slow to initiate movement, requires additional time Motor  Motor Motor: Abnormal postural alignment and control Motor - Discharge Observations: postural instability noted with dynamic standing/gait longer distances when pt becomes fatigued, dec postural awareness requiring cues  Mobility Bed Mobility Bed Mobility: Rolling Right;Rolling Left;Supine to Sit;Sit to Supine Rolling Right: Supervision/verbal cueing Rolling Left: Supervision/Verbal cueing Supine to Sit: Supervision/Verbal cueing Sit to Supine: Supervision/Verbal cueing Transfers Transfers: Sit to Stand;Squat Pivot Transfers Sit to Stand: Supervision/Verbal cueing Squat Pivot Transfers: Supervision/Verbal cueing Transfer (Assistive device): Rolling walker Locomotion  Gait Ambulation: Yes Gait Assistance: Supervision/Verbal cueing Gait Distance (Feet): 200 Feet Assistive device: Rolling walker Gait Assistance Details: Verbal cues for precautions/safety;Verbal cues for safe use of DME/AE;Verbal cues for gait pattern;Verbal cues for technique Gait Assistance Details: cues for positioning in RW Stairs / Additional Locomotion Stairs: Yes Stairs Assistance: Contact Guard/Touching assist Stair Management Technique: One rail Right Number of Stairs: 12 Height of Stairs: 6 Ramp: Minimal Assistance - Patient >75% Wheelchair Mobility Wheelchair Mobility: No  Trunk/Postural Assessment  Cervical Assessment Cervical Assessment: Exceptions to WFL(forward head posture) Thoracic Assessment Thoracic Assessment: Exceptions to WFL(rounded shoulder, mild kyphosis) Lumbar Assessment Lumbar Assessment: Exceptions to WFL(posterior pelvic tilt in sitting) Postural Control Postural Control: Deficits on evaluation Trunk Control: postural swaying  with dynamic standing with increased distance of gait or increased fatigue  Balance Balance Balance Assessed: Yes Static Sitting Balance Static Sitting - Level of Assistance: 7: Independent Dynamic Sitting Balance Dynamic Sitting - Level of Assistance: 5: Stand by assistance Static Standing Balance Static Standing - Level of Assistance: 5: Stand by assistance Dynamic Standing Balance Dynamic Standing - Level of Assistance: 5: Stand by assistance Extremity Assessment  RLE Assessment RLE Assessment: Exceptions to [facility] RLE Strength Right Hip Flexion: 4-/5 Right Knee Flexion: 4+/5 Right Knee Extension: 5/5 Right Ankle Dorsiflexion: 4-/5 LLE Assessment LLE Assessment: Exceptions to [facility] LLE Strength Left Hip Flexion: 4-/5 Left Knee Flexion: 4+/5 Left Knee Extension: 5/5 Left Ankle Dorsiflexion: 4/5    Kaytlynne Neace SPT 07/12/2019, 7:39 AM

## 2019-07-12 NOTE — Progress Notes (Signed)
Occupational Therapy Session Note  Patient Details  Name: Tanya Knight MRN: [id number] Date of Birth: [birth year]  Today's Date: 07/12/2019 OT Individual Time: 0920-1000 OT Individual Time Calculation (min): 40 min    Short Term Goals: Week 1:  OT Short Term Goal 1 (Week 1): Pt will maintain sustained attention for 30 mins with no more than min instructional cueing during ADL tasks. OT Short Term Goal 1 - Progress (Week 1): Not met OT Short Term Goal 2 (Week 1): Pt will complete UB selfcare in sitting with supervision. OT Short Term Goal 2 - Progress (Week 1): Not met OT Short Term Goal 3 (Week 1): Pt will complete LB dressing sit to stand with mod assist. OT Short Term Goal 3 - Progress (Week 1): Not met OT Short Term Goal 4 (Week 1): Pt will complete toilet transfers stand pivot with the RW with min assist. OT Short Term Goal 4 - Progress (Week 1): Not met Week 2:  OT Short Term Goal 1 (Week 2): Pt will maintain sustained attention for 30 mins with no more than min instructional cueing during ADL tasks. OT Short Term Goal 1 - Progress (Week 2): Not met OT Short Term Goal 2 (Week 2): Pt will complete UB selfcare in sitting with supervision. OT Short Term Goal 2 - Progress (Week 2): Met OT Short Term Goal 3 (Week 2): Pt will complete LB dressing sit to stand with mod assist. OT Short Term Goal 3 - Progress (Week 2): Met OT Short Term Goal 4 (Week 2): Pt will complete toilet transfers stand pivot with the RW with min assist. OT Short Term Goal 4 - Progress (Week 2): Not met Week 3:  OT Short Term Goal 1 (Week 3): Pt will complete toilet transfers stand pivot with the RW with min assist. OT Short Term Goal 1 - Progress (Week 3): Met OT Short Term Goal 2 (Week 3): Pt will maintain sustained attention for 30 mins with no more than min instructional cueing during ADL tasks. OT Short Term Goal 2 - Progress (Week 3): Met OT Short Term Goal 3 (Week 3): Pt will complete LB dressing sit to stand  with min assist. OT Short Term Goal 3 - Progress (Week 3): Met OT Short Term Goal 4 (Week 3): Pt will complete LB bathing sit to stand with min assist. OT Short Term Goal 4 - Progress (Week 3): Met Week 4:  OT Short Term Goal 1 (Week 4): Continue working on established LTGs set at supervision to min guard assist level.      Skilled Therapeutic Interventions/Progress Updates:    Pt received in bed ready for therapy.  Pt ambulated with her RW to bathroom, completed toileting, stepped into shower, showered seated with one sit to stand, ambulated to sit on toilet, and donned LB clothing with CGA.  Set up for UB clothing.  Ambulated to sink with CGA and then stood at sink with S to brush teeth and comb hair. She then ambulated to recliner. Pt participated extremely well but did get fatigued at end of session. No c/o dizziness.  Therapist wrote in her memory notebook. Pt resting in recliner with belt alarm on and all needs met.   Therapy Documentation Precautions:  Precautions Precautions: Fall Precaution Comments: global weakness and postural instability, visual/vestibular involvement Restrictions Weight Bearing Restrictions: No   Pain: Pain Assessment Pain Scale: 0-10 Pain Score: 0-No pain     Therapy/Group: Individual Therapy  Adriane Guglielmo 07/12/2019, 12:28 PM

## 2019-07-12 NOTE — Progress Notes (Signed)
[place] PHYSICAL MEDICINE & REHABILITATION PROGRESS NOTE   Subjective/Complaints: Nausea and headache are better controlled.  Urinating normally.  Sleeping well at night.  Having regular BM.   Objective:   No results found. Recent Labs    07/11/19 0545  WBC 4.8  HGB 12.3  HCT 37.2  PLT 210   Recent Labs    07/11/19 0545  NA 140  K 4.0  CL 99  CO2 31  GLUCOSE 97  BUN 14  CREATININE 0.74  CALCIUM 9.1    Intake/Output Summary (Last 24 hours) at 07/12/2019 1143 Last data filed at 07/12/2019 0717 Gross per 24 hour  Intake 240 ml  Output --  Net 240 ml     Physical Exam: Vital Signs Blood pressure 125/66, pulse 65, temperature 98.3 F (36.8 C), temperature source Oral, resp. rate 16, height 5\' 1"  (1.549 m), weight 52.8 kg, SpO2 100 %.   Constitutional: No distress . Vital signs reviewed. Lying in bed with eyes closed, easily arousable.  HEENT: EOMI, oral membranes moist Neck: supple Cardiovascular: RRR without murmur. No JVD    Respiratory/Chest: CTA Bilaterally without wheezes or rales. Normal effort    GI/Abdomen: BS +, non-tender, non-distended Ext: no clubbing, cyanosis, or edema Skin: No evidence of breakdown, no evidence of rash Neurologic: motor strength is 4/5 in bilateral deltoid, bicep, tricep, grip, hip flexor, knee extensors, ankle dorsiflexor and plantar flexor--some diplopia and depth perception issues Cerebellar exam minimal  dysmetria Left l finger to nose to finger Musculoskeletal: normal rom Psych: Very pleasant.   Assessment/Plan: 1. Functional deficits secondary to Cerebellar vermis ICH which require 3+ hours per day of interdisciplinary therapy in a comprehensive inpatient rehab setting.  Physiatrist is providing close team supervision and 24 hour management of active medical problems listed below.  Physiatrist and rehab team continue to assess barriers to discharge/monitor patient progress toward functional and medical goals  Care  Tool:  Bathing     Body parts bathed by patient: Right arm, Left arm, Chest, Abdomen, Front perineal area, Right upper leg, Left upper leg, Face, Right lower leg, Left lower leg, Buttocks   Body parts bathed by helper: Right lower leg, Left lower leg, Buttocks Body parts n/a: Abdomen, Chest(did not attempt this session)   Bathing assist Assist Level: Contact Guard/Touching assist     Upper Body Dressing/Undressing Upper body dressing   What is the patient wearing?: Pull over shirt    Upper body assist Assist Level: Set up assist    Lower Body Dressing/Undressing Lower body dressing      What is the patient wearing?: Pants     Lower body assist Assist for lower body dressing: Contact Guard/Touching assist     Toileting Toileting    Toileting assist Assist for toileting: Contact Guard/Touching assist     Transfers Chair/bed transfer  Transfers assist  Chair/bed transfer activity did not occur: Safety/medical concerns  Chair/bed transfer assist level: Contact Guard/Touching assist     Locomotion Ambulation   Ambulation assist   Ambulation activity did not occur: Safety/medical concerns  Assist level: Minimal Assistance - Patient > 75% Assistive device: Walker-rolling Max distance: 241'   Walk 10 feet activity   Assist  Walk 10 feet activity did not occur: Safety/medical concerns  Assist level: Minimal Assistance - Patient > 75% Assistive device: Walker-rolling   Walk 50 feet activity   Assist Walk 50 feet with 2 turns activity did not occur: Safety/medical concerns  Assist level: Minimal Assistance - Patient >  75% Assistive device: Walker-rolling    Walk 150 feet activity   Assist Walk 150 feet activity did not occur: Safety/medical concerns  Assist level: Minimal Assistance - Patient > 75% Assistive device: Walker-rolling    Walk 10 feet on uneven surface  activity   Assist Walk 10 feet on uneven surfaces activity did not occur:  Safety/medical concerns         Wheelchair     Assist Will patient use wheelchair at discharge?: No(Not anticipated; no LT goals set)   Wheelchair activity did not occur: Safety/medical concerns         Wheelchair 50 feet with 2 turns activity    Assist    Wheelchair 50 feet with 2 turns activity did not occur: Safety/medical concerns       Wheelchair 150 feet activity     Assist  Wheelchair 150 feet activity did not occur: Safety/medical concerns       Blood pressure 125/66, pulse 65, temperature 98.3 F (36.8 C), temperature source Oral, resp. rate 16, height 5\' 1"  (1.549 m), weight 52.8 kg, SpO2 100 %.  Medical assessment and plan:  1.  Truncal ataxia and central vestibular dysfunction secondary to intracranial hemorrhage, cerebellar vermis with SDH and SAH likely related to hypertensive crisis-    Continue CIR PT, OT, SLP, d/c date 3/3  -outpt ophtho assessment  -working through vestibular symptoms as possible with OT/PT 2.  Antithrombotics: -DVT/anticoagulation: Continue Subcutaneous heparin, initiated 06/11/2019             -antiplatelet therapy: N/A 3. Pain Management: Continue Tramadol as needed, Fioricet as needed for headaches. Well controlled. 4. Mood: In good spirits             -antipsychotic agents: N/A 5. Neuropsych: This patient is NOT capable of making decisions on her own behalf. 6. Skin/Wound Care: Continue routine skin checks 7. Fluids/Electrolytes/Nutrition: Routine in and outs. 8.  Hypertension.  Norvasc 10 mg daily, Toprol-XL 50 mg daily.  Monitor with increased mobility- monitor closely.    Vitals:   07/11/19 1949 07/12/19 0307  BP: 123/69 125/66  Pulse: 62 65  Resp: 18 16  Temp: 98.5 F (36.9 C) 98.3 F (36.8 C)  SpO2: 99% 100%   Controlled on 2/28, 3/1, 3/2 9.  History of seizure disorder.  Continue Depakote 500 mg every 12 hours  Valproic acid within normal limits on 2/16 10. Diplopia- suggest eye patch intermittently  to help with double vision 11. Hypokalemia  Resolved   BMP Latest Ref Rng & Units 07/11/2019 07/04/2019 06/27/2019  Glucose 70 - 99 mg/dL 97 104(H) 105(H)  BUN 8 - 23 mg/dL 14 24(H) 31(H)  Creatinine 0.44 - 1.00 mg/dL 0.74 0.79 0.66  Sodium 135 - 145 mmol/L 140 141 138  Potassium 3.5 - 5.1 mmol/L 4.0 3.8 3.9  Chloride 98 - 111 mmol/L 99 104 107  CO2 22 - 32 mmol/L 31 25 20(L)  Calcium 8.9 - 10.3 mg/dL 9.1 9.6 9.1  12. Vascular HA:  Continue Tylenol as needed- working well   Controlled on 2/28, 3/1, 3/2 13. Nausea/+/- vomiting-  Improved PRN Zofran, receiving daily 14 GERD: Unchanged PPI to H2 blocker to help with Mg+++ absorption  14. Dysphagia due to CVA   D3 thins, advance as tolerated 15.  Elevated BUN  Trending up on 2/15, normalizing with improved intake   Encourage fluids- only ~333ml recorded per day 16.  Central vestibular disorder, symptomatic at times with dizziness will trial meclizine and monitor for  nausea, receiving daily 17. Insomnia trial trazodone---improvement 18. Disposition: Will require 24/7 supervision upon discharge.     LOS: 28 days A FACE TO FACE EVALUATION WAS PERFORMED  Clide Deutscher Lot [place]ford 07/12/2019, 11:43 AM

## 2019-07-13 NOTE — Discharge Instructions (Signed)
Inpatient Rehab Discharge Instructions  Tanya Knight Discharge date and time: No discharge date for patient encounter.   Activities/Precautions/ Functional Status: Activity: activity as tolerated Diet: regular diet Wound Care: none needed Functional status:  ___ No restrictions     ___ Walk up steps independently ___ 24/7 supervision/assistance   ___ Walk up steps with assistance ___ Intermittent supervision/assistance  ___ Bathe/dress independently ___ Walk with walker     _x__ Bathe/dress with assistance ___ Walk Independently    ___ Shower independently ___ Walk with assistance    ___ Shower with assistance ___ No alcohol     ___ Return to work/school ________  COMMUNITY REFERRALS UPON DISCHARGE:  Home Health: PT,OT, ST  Agency:[facility] Phone:[phone]  Medical Equipment/Items Ordered:RW, 3n1, Transport W/C  Agency/Supplier:[facility]  Special Instructions: No driving smoking or alcohol   My questions have been answered and I understand these instructions. I will adhere to these goals and the provided educational materials after my discharge from the hospital.  Patient/Caregiver Signature _______________________________ Date __________  Clinician Signature _______________________________________ Date __________  Please bring this form and your medication list with you to all your follow-up doctor's appointments.

## 2019-07-13 NOTE — Plan of Care (Signed)
  Problem: Increased Nutrient Needs (NI-5.1) Goal: Food and/or nutrient delivery Description: Individualized approach for food/nutrient provision. Outcome: Adequate for Discharge   

## 2019-07-13 NOTE — Care Management (Signed)
The overall goal for the admission was met for:   Discharge location: Home with spouse and son  Length of Stay: 22 days with discharge 07/13/19  Discharge activity level: Patient to discharge at overall Supervision to CGA  level  Home/community participation: Limited Participation  Services provided included: MD, RD, PT, OT, SLP, RN, CM, Pharmacy, Neuropsych and SW  Financial Services: Medicare  Follow-up services arranged: Home Health: Pt, OT, SLP with [facility], DME: RW, 3n1, transport chair and Patient/Family request agency HH: [facility]-denied coverage, DME: None  Comments (or additional information):[facility] [phone]  Patient/Family verbalized understanding of follow-up arrangements: Yes  Individual responsible for coordination of the follow-up plan: Spouse: Frank Olsen [phone] and son:Frank Sockwell [phone]  Confirmed correct DME delivered: Sharp, Deborah B 07/13/2019    Sharp, Deborah B 

## 2019-07-13 NOTE — Progress Notes (Signed)
Patient received the discharged instructions before she left from [place].

## 2019-07-14 ENCOUNTER — Telehealth: Payer: Self-pay

## 2019-07-14 DIAGNOSIS — H8190 Unspecified disorder of vestibular function, unspecified ear: Secondary | ICD-10-CM | POA: Diagnosis not present

## 2019-07-14 DIAGNOSIS — M810 Age-related osteoporosis without current pathological fracture: Secondary | ICD-10-CM | POA: Diagnosis not present

## 2019-07-14 DIAGNOSIS — R1310 Dysphagia, unspecified: Secondary | ICD-10-CM | POA: Diagnosis not present

## 2019-07-14 DIAGNOSIS — H5500 Unspecified nystagmus: Secondary | ICD-10-CM | POA: Diagnosis not present

## 2019-07-14 DIAGNOSIS — I69391 Dysphagia following cerebral infarction: Secondary | ICD-10-CM | POA: Diagnosis not present

## 2019-07-14 DIAGNOSIS — G40909 Epilepsy, unspecified, not intractable, without status epilepticus: Secondary | ICD-10-CM | POA: Diagnosis not present

## 2019-07-14 DIAGNOSIS — I69293 Ataxia following other nontraumatic intracranial hemorrhage: Secondary | ICD-10-CM | POA: Diagnosis not present

## 2019-07-14 DIAGNOSIS — I10 Essential (primary) hypertension: Secondary | ICD-10-CM | POA: Diagnosis not present

## 2019-07-14 DIAGNOSIS — I69298 Other sequelae of other nontraumatic intracranial hemorrhage: Secondary | ICD-10-CM | POA: Diagnosis not present

## 2019-07-14 DIAGNOSIS — K219 Gastro-esophageal reflux disease without esophagitis: Secondary | ICD-10-CM | POA: Diagnosis not present

## 2019-07-14 NOTE — Telephone Encounter (Signed)
Transitional Care call--Spouse Pilar Plate for update and Tanya Knight for appt    1. Are you/is patient experiencing any problems since coming home? No Are there any questions regarding any aspect of care? No 2. Are there any questions regarding medications administration/dosing? No Are meds being taken as prescribed? Yes Patient should review meds with caller to confirm 3. Have there been any falls? No 4. Has Home Health been to the house and/or have they contacted you? Yes If not, have you tried to contact them? Can we help you contact them? 5. Are bowels and bladder emptying properly? Yes Are there any unexpected incontinence issues? No If applicable, is patient following bowel/bladder programs? 6. Any fevers, problems with breathing, unexpected pain? No 7. Are there any skin problems or new areas of breakdown? No 8. Has the patient/family member arranged specialty MD follow up (ie cardiology/neurology/renal/surgical/etc)? Yes  Can we help arrange? 9. Does the patient need any other services or support that we can help arrange? No 10. Are caregivers following through as expected in assisting the patient? Yes 11. Has the patient quit smoking, drinking alcohol, or using drugs as recommended? Yes  Appointment time 11:40 am, arrive time 11:20 am with Dr. Ranell Patrick on 07/26/2019 [address]

## 2019-07-14 NOTE — Telephone Encounter (Signed)
Noted  

## 2019-07-14 NOTE — Telephone Encounter (Signed)
Transition Care Management Follow-up Telephone Call  Date of discharge and from where: 07/13/2019[facility]  How have you been since you were released from the hospital? Husband states that patient is doing okay. He states that physical therapy is coming to work with patient with her functional status.   Any questions or concerns? No   Items Reviewed:  Did the pt receive and understand the discharge instructions provided? Yes   Medications obtained and verified? Yes   Any new allergies since your discharge? No   Dietary orders reviewed? Yes  Do you have support at home? Yes   Functional Questionnaire: (I = Independent and D = Dependent) ADLs: D  Bathing/Dressing- D  Meal Prep- D  Eating- I  Maintaining continence- I  Transferring/Ambulation- D  Managing Meds- D  Follow up appointments reviewed:   PCP Hospital f/u appt confirmed? No  Husband stated that he wants to wait and allow his son to call and schedule an appointment. His son handles all of their doctors appointments.  [facility] f/u appt confirmed? N/A  Are transportation arrangements needed? No   If their condition worsens, is the pt aware to call PCP or go to the Emergency Dept.? Yes  Was the patient provided with contact information for the PCP's office or ED? Yes  Was to pt encouraged to call back with questions or concerns? Yes

## 2019-07-15 ENCOUNTER — Telehealth: Payer: Self-pay

## 2019-07-15 NOTE — Telephone Encounter (Signed)
Pt's son called triage line and left a message asking if it would be safe for the pt to get her 2nd Covid vaccine. She received her 1st vaccine prior to her CVA a month ago.Wanted to make sure it was safe for her to get her 2nd vaccine soon.

## 2019-07-15 NOTE — Telephone Encounter (Addendum)
1)Frank, son, called back. There are no appointments anytime soon for hospital follow up, except for some same days. Can patient be worked in somewhere or it is ok to wait 2 weeks or more to get in?  2)Frank also wanted to make sure that patient is ok to proceed with her second Pfizer vaccine scheduled for next week?  Frank's CB is [phone] (he is on [place])

## 2019-07-16 NOTE — Telephone Encounter (Signed)
Okay to add at 1:30 this Thursday---or 1:45 and add same day at [facility] on the 18th. If needed, I can add on at the end on a Wednesday afternoon

## 2019-07-16 NOTE — Telephone Encounter (Signed)
Yes---it would be safe for her to get her second COVID vaccine

## 2019-07-18 DIAGNOSIS — K219 Gastro-esophageal reflux disease without esophagitis: Secondary | ICD-10-CM | POA: Diagnosis not present

## 2019-07-18 DIAGNOSIS — G40909 Epilepsy, unspecified, not intractable, without status epilepticus: Secondary | ICD-10-CM | POA: Diagnosis not present

## 2019-07-18 DIAGNOSIS — R1310 Dysphagia, unspecified: Secondary | ICD-10-CM | POA: Diagnosis not present

## 2019-07-18 DIAGNOSIS — I10 Essential (primary) hypertension: Secondary | ICD-10-CM | POA: Diagnosis not present

## 2019-07-18 DIAGNOSIS — I69391 Dysphagia following cerebral infarction: Secondary | ICD-10-CM | POA: Diagnosis not present

## 2019-07-18 DIAGNOSIS — M810 Age-related osteoporosis without current pathological fracture: Secondary | ICD-10-CM | POA: Diagnosis not present

## 2019-07-18 DIAGNOSIS — I69293 Ataxia following other nontraumatic intracranial hemorrhage: Secondary | ICD-10-CM | POA: Diagnosis not present

## 2019-07-18 DIAGNOSIS — H8190 Unspecified disorder of vestibular function, unspecified ear: Secondary | ICD-10-CM | POA: Diagnosis not present

## 2019-07-18 DIAGNOSIS — I69298 Other sequelae of other nontraumatic intracranial hemorrhage: Secondary | ICD-10-CM | POA: Diagnosis not present

## 2019-07-18 NOTE — Telephone Encounter (Signed)
Noted  

## 2019-07-18 NOTE — Telephone Encounter (Signed)
I left a detailed message on son's voice mail to call back.

## 2019-07-18 NOTE — Telephone Encounter (Signed)
Pilar Plate called back and scheduled appointment on 07/21/19 @ 1:30.

## 2019-07-19 DIAGNOSIS — H8190 Unspecified disorder of vestibular function, unspecified ear: Secondary | ICD-10-CM | POA: Diagnosis not present

## 2019-07-19 DIAGNOSIS — I69298 Other sequelae of other nontraumatic intracranial hemorrhage: Secondary | ICD-10-CM | POA: Diagnosis not present

## 2019-07-19 DIAGNOSIS — I69293 Ataxia following other nontraumatic intracranial hemorrhage: Secondary | ICD-10-CM | POA: Diagnosis not present

## 2019-07-19 DIAGNOSIS — M810 Age-related osteoporosis without current pathological fracture: Secondary | ICD-10-CM | POA: Diagnosis not present

## 2019-07-19 DIAGNOSIS — G40909 Epilepsy, unspecified, not intractable, without status epilepticus: Secondary | ICD-10-CM | POA: Diagnosis not present

## 2019-07-19 DIAGNOSIS — I69391 Dysphagia following cerebral infarction: Secondary | ICD-10-CM | POA: Diagnosis not present

## 2019-07-19 DIAGNOSIS — K219 Gastro-esophageal reflux disease without esophagitis: Secondary | ICD-10-CM | POA: Diagnosis not present

## 2019-07-19 DIAGNOSIS — I10 Essential (primary) hypertension: Secondary | ICD-10-CM | POA: Diagnosis not present

## 2019-07-19 DIAGNOSIS — R1310 Dysphagia, unspecified: Secondary | ICD-10-CM | POA: Diagnosis not present

## 2019-07-21 ENCOUNTER — Ambulatory Visit: Payer: Medicare PPO | Admitting: Internal Medicine

## 2019-07-21 ENCOUNTER — Other Ambulatory Visit: Payer: Self-pay

## 2019-07-21 ENCOUNTER — Encounter: Payer: Self-pay | Admitting: Internal Medicine

## 2019-07-21 DIAGNOSIS — I619 Nontraumatic intracerebral hemorrhage, unspecified: Secondary | ICD-10-CM | POA: Diagnosis not present

## 2019-07-21 DIAGNOSIS — K219 Gastro-esophageal reflux disease without esophagitis: Secondary | ICD-10-CM

## 2019-07-21 DIAGNOSIS — I10 Essential (primary) hypertension: Secondary | ICD-10-CM | POA: Diagnosis not present

## 2019-07-21 DIAGNOSIS — G40909 Epilepsy, unspecified, not intractable, without status epilepticus: Secondary | ICD-10-CM

## 2019-07-21 MED ORDER — FAMOTIDINE 20 MG PO TABS: 20.0000 mg | ORAL_TABLET | Freq: Every evening | ORAL | 0 refills | Status: DC | PRN

## 2019-07-21 NOTE — Assessment & Plan Note (Signed)
Still with rare symptoms pepcid prn only

## 2019-07-21 NOTE — Assessment & Plan Note (Signed)
BP Readings from Last 3 Encounters:  07/21/19 122/74  07/13/19 133/76  06/14/19 (!) 151/68   Has had reasonable control More medication now due to concern that BP led to bleed

## 2019-07-21 NOTE — Assessment & Plan Note (Signed)
None related to the bleed Still on depakote

## 2019-07-21 NOTE — Assessment & Plan Note (Signed)
Still with sig balance and vision changes Speech is better than when in rehab Working with home PT/OT still Needs assistance at home

## 2019-07-21 NOTE — Progress Notes (Signed)
Subjective:    Patient ID: Tanya Knight, female    DOB: 11-03-1940, 79 y.o.   MRN: [id number]  HPI Here for hospital follow up after acute admission and then inpatient rehab for intracerebral hemorrhage Reviewed hospital and rehab records Here with [name]  This visit occurred during the SARS-CoV-2 public health emergency.  Safety protocols were in place, including screening questions prior to the visit, additional usage of staff PPE, and extensive cleaning of exam room while observing appropriate contact time as indicated for disinfecting solutions.   Had headache and nausea---found to have [place] (with SDH and SAH extension) No neurosurgical intervention needed Has had extensive inpatient rehab--with slow progress  Now home Very unstable now--can stand but doesn't walk without someone holding on (with gait belt) Using standard walker Using bathroom herself after gets there---no incontinence. She dresses after set up Showers with bench---with stand by assist from husband  Husband is handling all instrumental ADLs Son doing the shopping  Has been still getting home PT/OT Will have speech evaluation  Current Outpatient Medications on File Prior to Visit  Medication Sig Dispense Refill  . amLODipine (NORVASC) 10 MG tablet Take 1 tablet (10 mg total) by mouth daily. 30 tablet 0  . divalproex (DEPAKOTE) 500 MG DR tablet Take 1 tablet (500 mg total) by mouth every 12 (twelve) hours. 60 tablet 0  . famotidine (PEPCID) 20 MG tablet Take 1 tablet (20 mg total) by mouth at bedtime. 30 tablet 0  . magnesium oxide (MAG-OX) 400 (241.3 Mg) MG tablet Take 1 tablet (400 mg total) by mouth 3 (three) times daily. 90 tablet 0  . meclizine (ANTIVERT) 12.5 MG tablet Take 1 tablet (12.5 mg total) by mouth 2 (two) times daily as needed for dizziness. 30 tablet 0  . metoprolol succinate (TOPROL-XL) 50 MG 24 hr tablet Take 1 tablet (50 mg total) by mouth daily. Take with or immediately following a  meal. 30 tablet 0  . Multiple Vitamin (MULTIVITAMIN) capsule Take 1 capsule by mouth daily.      . Ondansetron HCl (ZOFRAN PO) Take by mouth. Unsure of dosage    . acetaminophen (TYLENOL) 325 MG tablet Take 2 tablets (650 mg total) by mouth every 4 (four) hours as needed for mild pain (or temp > 37.5 C (99.5 F)). (Patient not taking: Reported on 07/21/2019)    . cholecalciferol (VITAMIN D) 1000 units tablet Take 1 tablet (1,000 Units total) by mouth daily. (Patient not taking: Reported on 07/21/2019) 30 tablet 0   No current facility-administered medications on file prior to visit.    Allergies  Allergen Reactions  . Phenytoin     Other reaction(s): Other (See Comments) Other Reaction: Other reaction REACTION: severe reaction    Past Medical History:  Diagnosis Date  . Fracture of metatarsal    Repair of fractured R metatarsal --Dr Duda---4/08  . GERD (gastroesophageal reflux disease)   . Hypertension   . Melanoma in situ [facility]) 7/17   Dr Kellie Moor  . Osteoporosis   . Seizure disorder ([place])   . Seizures ([place])   . Vitamin D deficiency     Past Surgical History:  Procedure Laterality Date  . CATARACT EXTRACTION, BILATERAL Bilateral 2014  . EYE SURGERY     Obstructed tear duct  . FOOT SURGERY  2008  . MELANOMA EXCISION Left 11/2015   left lower leg  . TEAR DUCT PROBING  10/12   temporary stent  . [place]  Family History  Problem Relation Age of Onset  . Hypertension Mother   . Heart disease Father   . Breast cancer Daughter 68  . Diabetes Maternal Aunt   . Coronary artery disease Paternal Aunt   . Breast cancer Paternal Aunt   . Coronary artery disease Paternal Uncle   . Heart disease Maternal Grandmother   . Heart disease Maternal Grandfather   . Heart disease Paternal Grandmother   . Heart disease Paternal Grandfather   . Colon cancer Neg Hx   . Stomach cancer Neg Hx   . Pancreatic cancer Neg Hx   . Rectal cancer Neg Hx      Social History   Socioeconomic History  . Marital status: Married    Spouse name: Not on file  . Number of children: 3  . Years of education: Not on file  . Highest education level: Not on file  Occupational History  . Occupation: retired Financial planner: retired  Tobacco Use  . Smoking status: Never Smoker  . Smokeless tobacco: Never Used  Substance and Sexual Activity  . Alcohol use: Yes    Alcohol/week: 0.0 standard drinks    Comment: occasional  . Drug use: No  . Sexual activity: Yes    Birth control/protection: Post-menopausal  Other Topics Concern  . Not on file  Social History Narrative   Regular exercise-yes---aerobics, Pilates, jogged in past (now walks)      Has living will   Husband, then one of her children, has health care POA   Would accept resuscitation but no prolonged artificial ventilation   Probably would not want tube feeds if cognitively unaware   Social Determinants of Health   Financial Resource Strain:   . Difficulty of Paying Living Expenses:   Food Insecurity:   . Worried About Charity fundraiser in the Last Year:   . Arboriculturist in the Last Year:   Transportation Needs:   . Film/video editor (Medical):   Marland Kitchen Lack of Transportation (Non-Medical):   Physical Activity:   . Days of Exercise per Week:   . Minutes of Exercise per Session:   Stress:   . Feeling of Stress :   Social Connections:   . Frequency of Communication with Friends and Family:   . Frequency of Social Gatherings with Friends and Family:   . Attends Religious Services:   . Active Member of Clubs or Organizations:   . Attends Archivist Meetings:   Marland Kitchen Marital Status:   Intimate Partner Violence:   . Fear of Current or Ex-Partner:   . Emotionally Abused:   Marland Kitchen Physically Abused:   . Sexually Abused:    Review of Systems Had second COVID vaccine this morning Vision still poor---can't focus to read. Going to neuro-ophthalmologist for this  Still gets vertigo--meclizine helps. Does have "eye bouncing" Appetite is good now--did lose some weight in the hospital No seizures through this episode Still only rare heartburn  No dysphagia Bowels okay Having some back pain and spasms--shooting pain (mostly night but sometimes in chair). Heat can help    Objective:   Physical Exam  Constitutional: No distress.  Eyes: Pupils are equal, round, and reactive to light.  No nystagmus  Neck: No thyromegaly present.  Cardiovascular: Normal rate, regular rhythm and normal heart sounds. Exam reveals no gallop.  No murmur heard. Respiratory: Effort normal and breath sounds normal. No respiratory distress. She has no wheezes. She has no rales.  GI: Soft. There is no abdominal tenderness.  Musculoskeletal:        General: No edema.  Lymphadenopathy:    She has no cervical adenopathy.  Neurological:  Able to stand and sit independently Takes a few steps No focal weakness Normal tone  Psychiatric: She has a normal mood and affect. Her behavior is normal.           Assessment & Plan:

## 2019-07-22 ENCOUNTER — Telehealth: Payer: Self-pay

## 2019-07-22 DIAGNOSIS — I69298 Other sequelae of other nontraumatic intracranial hemorrhage: Secondary | ICD-10-CM | POA: Diagnosis not present

## 2019-07-22 DIAGNOSIS — I10 Essential (primary) hypertension: Secondary | ICD-10-CM | POA: Diagnosis not present

## 2019-07-22 DIAGNOSIS — R1310 Dysphagia, unspecified: Secondary | ICD-10-CM | POA: Diagnosis not present

## 2019-07-22 DIAGNOSIS — M810 Age-related osteoporosis without current pathological fracture: Secondary | ICD-10-CM | POA: Diagnosis not present

## 2019-07-22 DIAGNOSIS — I69293 Ataxia following other nontraumatic intracranial hemorrhage: Secondary | ICD-10-CM | POA: Diagnosis not present

## 2019-07-22 DIAGNOSIS — K219 Gastro-esophageal reflux disease without esophagitis: Secondary | ICD-10-CM | POA: Diagnosis not present

## 2019-07-22 DIAGNOSIS — H8190 Unspecified disorder of vestibular function, unspecified ear: Secondary | ICD-10-CM | POA: Diagnosis not present

## 2019-07-22 DIAGNOSIS — I69391 Dysphagia following cerebral infarction: Secondary | ICD-10-CM | POA: Diagnosis not present

## 2019-07-22 DIAGNOSIS — G40909 Epilepsy, unspecified, not intractable, without status epilepticus: Secondary | ICD-10-CM | POA: Diagnosis not present

## 2019-07-22 NOTE — Telephone Encounter (Signed)
Marlowe Kays OT with [facility] left v/m requesting verbal orders for [facility] OT 1 x a wk for 6 weeks.

## 2019-07-22 NOTE — Telephone Encounter (Signed)
Verbal orders left on verified VM for [facility]

## 2019-07-22 NOTE — Telephone Encounter (Signed)
That is fine 

## 2019-07-26 ENCOUNTER — Other Ambulatory Visit: Payer: Self-pay

## 2019-07-26 ENCOUNTER — Other Ambulatory Visit: Payer: Self-pay | Admitting: Physical Medicine and Rehabilitation

## 2019-07-26 ENCOUNTER — Encounter: Payer: Self-pay | Admitting: Physical Medicine and Rehabilitation

## 2019-07-26 ENCOUNTER — Encounter: Payer: Medicare PPO | Attending: Physical Medicine and Rehabilitation | Admitting: Physical Medicine and Rehabilitation

## 2019-07-26 VITALS — BP 110/77 | HR 87 | Temp 97.8°F | Ht 61.5 in | Wt 117.0 lb

## 2019-07-26 DIAGNOSIS — I69391 Dysphagia following cerebral infarction: Secondary | ICD-10-CM | POA: Diagnosis not present

## 2019-07-26 DIAGNOSIS — I619 Nontraumatic intracerebral hemorrhage, unspecified: Secondary | ICD-10-CM | POA: Diagnosis not present

## 2019-07-26 DIAGNOSIS — I10 Essential (primary) hypertension: Secondary | ICD-10-CM | POA: Insufficient documentation

## 2019-07-26 MED ORDER — AMLODIPINE BESYLATE 5 MG PO TABS: 5.0000 mg | ORAL_TABLET | Freq: Every day | ORAL | 0 refills | Status: DC

## 2019-07-26 MED ORDER — SCOPOLAMINE 1 MG/3DAYS TD PT72: 1.0000 | MEDICATED_PATCH | TRANSDERMAL | 12 refills | Status: DC

## 2019-07-26 NOTE — Progress Notes (Signed)
Subjective:    Patient ID: Tanya Knight, female    DOB: 1941-01-15, 79 y.o.   MRN: [id number]  HPI  Tanya Knight is a 79 year old woman who presents for transitional care follow-up after CIR admission for intracerebral hemorrhage.  She has been doing well at home. She is receiving home PT and OT and has been working on strengthening, ADLs, and ambulation. She has been ambulating around her home with her husband's assistance. She gets out of her wheelchair 4-5 times per day to use the bathroom and this is when she usually ambulates. She has not ambulated outside except for her doctors' visits.   She has been taking all medications as prescribed. She is not sure if she uses the Meclizine. She still has nausea, dizziness, though they are improved from the hospital.   She no longer has headache. Denies pain, constipation. Sleeping well at night.  Her SBP in office is 110 and she has been taking amlodipine 10mg  daily. She does take her BP at home.   Pain Inventory Average Pain 8 Pain Right Now 0 My pain is .  In the last 24 hours, has pain interfered with the following? General activity 10 Relation with others 10 Enjoyment of life 10 What TIME of day is your pain at its worst? morning Sleep (in general) Good  Pain is worse with: . Pain improves with: rest and medication Relief from Meds: 8  Mobility walk with assistance use a walker ability to climb steps?  yes do you drive?  no use a wheelchair needs help with transfers  Function retired I need assistance with the following:  meal prep, household duties and shopping  Neuro/Psych trouble walking dizziness  Prior Studies Any changes since last visit?  no  Physicians involved in your care Any changes since last visit?  no   Family History  Problem Relation Age of Onset  . Hypertension Mother   . Heart disease Father   . Breast cancer Daughter 8  . Diabetes Maternal Aunt   . Coronary artery disease Paternal  Aunt   . Breast cancer Paternal Aunt   . Coronary artery disease Paternal Uncle   . Heart disease Maternal Grandmother   . Heart disease Maternal Grandfather   . Heart disease Paternal Grandmother   . Heart disease Paternal Grandfather   . Colon cancer Neg Hx   . Stomach cancer Neg Hx   . Pancreatic cancer Neg Hx   . Rectal cancer Neg Hx    Social History   Socioeconomic History  . Marital status: Married    Spouse name: Not on file  . Number of children: 3  . Years of education: Not on file  . Highest education level: Not on file  Occupational History  . Occupation: retired Financial planner: retired  Tobacco Use  . Smoking status: Never Smoker  . Smokeless tobacco: Never Used  Substance and Sexual Activity  . Alcohol use: Yes    Alcohol/week: 0.0 standard drinks    Comment: occasional  . Drug use: No  . Sexual activity: Yes    Birth control/protection: Post-menopausal  Other Topics Concern  . Not on file  Social History Narrative   Regular exercise-yes---aerobics, Pilates, jogged in past (now walks)      Has living will   Husband, then one of her children, has health care POA   Would accept resuscitation but no prolonged artificial ventilation   Probably would not want tube feeds  if cognitively unaware   Social Determinants of Health   Financial Resource Strain:   . Difficulty of Paying Living Expenses:   Food Insecurity:   . Worried About Charity fundraiser in the Last Year:   . Arboriculturist in the Last Year:   Transportation Needs:   . Film/video editor (Medical):   Marland Kitchen Lack of Transportation (Non-Medical):   Physical Activity:   . Days of Exercise per Week:   . Minutes of Exercise per Session:   Stress:   . Feeling of Stress :   Social Connections:   . Frequency of Communication with Friends and Family:   . Frequency of Social Gatherings with Friends and Family:   . Attends Religious Services:   . Active Member of Clubs or  Organizations:   . Attends Archivist Meetings:   Marland Kitchen Marital Status:    Past Surgical History:  Procedure Laterality Date  . CATARACT EXTRACTION, BILATERAL Bilateral 2014  . EYE SURGERY     Obstructed tear duct  . FOOT SURGERY  2008  . MELANOMA EXCISION Left 11/2015   left lower leg  . TEAR DUCT PROBING  10/12   temporary stent  . TONSILLECTOMY AND ADENOIDECTOMY  1948   Past Medical History:  Diagnosis Date  . Fracture of metatarsal    Repair of fractured R metatarsal --Dr Duda---4/08  . GERD (gastroesophageal reflux disease)   . Hypertension   . Melanoma in situ [facility]) 7/17   Dr Kellie Moor  . Osteoporosis   . Seizure disorder ([place])   . Seizures ([place])   . Vitamin D deficiency    BP 110/77   Pulse 87   Temp 97.8 F (36.6 C)   Ht 5' 1.5" (1.562 m)   Wt 117 lb (53.1 kg)   SpO2 97%   BMI 21.75 kg/m   Opioid Risk Score:   Fall Risk Score:  `1  Depression screen PHQ 2/9  Depression screen [facility] 2/9 03/14/2019 09/23/2017 09/15/2016 09/14/2015 09/08/2014 09/06/2013 09/06/2013  Decreased Interest 0 0 0 0 0 0 0  Down, Depressed, Hopeless 0 0 0 0 0 0 0  PHQ - 2 Score 0 0 0 0 0 0 0     Review of Systems  Constitutional: Negative.   HENT: Negative.   Eyes: Negative.   Respiratory: Negative.   Cardiovascular: Negative.   Gastrointestinal: Positive for nausea.  Endocrine: Negative.   Genitourinary: Negative.   Musculoskeletal: Positive for gait problem.  Skin: Negative.   Allergic/Immunologic: Negative.   Neurological: Positive for dizziness.  Hematological: Negative.   Psychiatric/Behavioral: Negative.   All other systems reviewed and are negative.      Objective:   Physical Exam Constitutional: No distress . Vital signs reviewed. Sitting up in [facility] with son at bedside.  HEENT: EOMI, oral membranes moist Neck: supple Cardiovascular: RRR without murmur. No JVD    Respiratory/Chest: CTA Bilaterally without wheezes or rales. Normal effort    GI/Abdomen: BS +,  non-tender, non-distended Ext: no clubbing, cyanosis, or edema Skin: No evidence of breakdown, no evidence of rash Neurologic: motor strength is 4/5 in bilateral deltoid, bicep, tricep, grip, hip flexor, knee extensors, ankle dorsiflexor and plantar flexor--some diplopia and depth perception issues Did not attempt ambulation as she did not bring walker with her to appointment.  Cerebellar exam minimal  dysmetria Left l finger to nose to finger Musculoskeletal: normal rom Psych: Very pleasant.        Assessment & Plan:  1. 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              Continue home therapy. Progressing well. Patient asked when she may be able to ambulate on her own without her husband's assistance. Advised that she still need's her husband's assistance at this time for safety but she will continue to improve with home therapy and her team will inform her when it is safe for her to ambulate independently.             -outpt ophtho assessment scheduled for April.               2. Pain Management: Headaches have resolved and she is not taking anything for pain.   3. Mood: In good spirits  4. Hypertension. BP low in office. Could also contribute to dizziness. Decrease Norvasc to 5mg  daily. Continue Toprol-XL 50 mg daily. Check BP daily and log results; please bring to follow-up appointment.   5. History of seizure disorder. Continue Depakote 500 mg every 12 hours              6. Dizziness and nausea: Prescribed scopolamine patch.  All questions answered. RTC in 1 month to assess progress with above interventions.

## 2019-07-27 DIAGNOSIS — I10 Essential (primary) hypertension: Secondary | ICD-10-CM | POA: Diagnosis not present

## 2019-07-27 DIAGNOSIS — R1310 Dysphagia, unspecified: Secondary | ICD-10-CM | POA: Diagnosis not present

## 2019-07-27 DIAGNOSIS — I69298 Other sequelae of other nontraumatic intracranial hemorrhage: Secondary | ICD-10-CM | POA: Diagnosis not present

## 2019-07-27 DIAGNOSIS — M810 Age-related osteoporosis without current pathological fracture: Secondary | ICD-10-CM | POA: Diagnosis not present

## 2019-07-27 DIAGNOSIS — I69293 Ataxia following other nontraumatic intracranial hemorrhage: Secondary | ICD-10-CM | POA: Diagnosis not present

## 2019-07-27 DIAGNOSIS — I69391 Dysphagia following cerebral infarction: Secondary | ICD-10-CM | POA: Diagnosis not present

## 2019-07-27 DIAGNOSIS — K219 Gastro-esophageal reflux disease without esophagitis: Secondary | ICD-10-CM | POA: Diagnosis not present

## 2019-07-27 DIAGNOSIS — G40909 Epilepsy, unspecified, not intractable, without status epilepticus: Secondary | ICD-10-CM | POA: Diagnosis not present

## 2019-07-27 DIAGNOSIS — H8190 Unspecified disorder of vestibular function, unspecified ear: Secondary | ICD-10-CM | POA: Diagnosis not present

## 2019-07-28 DIAGNOSIS — M810 Age-related osteoporosis without current pathological fracture: Secondary | ICD-10-CM | POA: Diagnosis not present

## 2019-07-28 DIAGNOSIS — I69298 Other sequelae of other nontraumatic intracranial hemorrhage: Secondary | ICD-10-CM | POA: Diagnosis not present

## 2019-07-28 DIAGNOSIS — K219 Gastro-esophageal reflux disease without esophagitis: Secondary | ICD-10-CM | POA: Diagnosis not present

## 2019-07-28 DIAGNOSIS — R1310 Dysphagia, unspecified: Secondary | ICD-10-CM | POA: Diagnosis not present

## 2019-07-28 DIAGNOSIS — I10 Essential (primary) hypertension: Secondary | ICD-10-CM | POA: Diagnosis not present

## 2019-07-28 DIAGNOSIS — G40909 Epilepsy, unspecified, not intractable, without status epilepticus: Secondary | ICD-10-CM | POA: Diagnosis not present

## 2019-07-28 DIAGNOSIS — H8190 Unspecified disorder of vestibular function, unspecified ear: Secondary | ICD-10-CM | POA: Diagnosis not present

## 2019-07-28 DIAGNOSIS — I69293 Ataxia following other nontraumatic intracranial hemorrhage: Secondary | ICD-10-CM | POA: Diagnosis not present

## 2019-07-28 DIAGNOSIS — I69391 Dysphagia following cerebral infarction: Secondary | ICD-10-CM | POA: Diagnosis not present

## 2019-07-29 ENCOUNTER — Telehealth: Payer: Self-pay

## 2019-07-29 DIAGNOSIS — I69391 Dysphagia following cerebral infarction: Secondary | ICD-10-CM | POA: Diagnosis not present

## 2019-07-29 DIAGNOSIS — R1310 Dysphagia, unspecified: Secondary | ICD-10-CM | POA: Diagnosis not present

## 2019-07-29 DIAGNOSIS — H8190 Unspecified disorder of vestibular function, unspecified ear: Secondary | ICD-10-CM | POA: Diagnosis not present

## 2019-07-29 DIAGNOSIS — K219 Gastro-esophageal reflux disease without esophagitis: Secondary | ICD-10-CM | POA: Diagnosis not present

## 2019-07-29 DIAGNOSIS — M810 Age-related osteoporosis without current pathological fracture: Secondary | ICD-10-CM | POA: Diagnosis not present

## 2019-07-29 DIAGNOSIS — I69293 Ataxia following other nontraumatic intracranial hemorrhage: Secondary | ICD-10-CM | POA: Diagnosis not present

## 2019-07-29 DIAGNOSIS — G40909 Epilepsy, unspecified, not intractable, without status epilepticus: Secondary | ICD-10-CM | POA: Diagnosis not present

## 2019-07-29 DIAGNOSIS — I69298 Other sequelae of other nontraumatic intracranial hemorrhage: Secondary | ICD-10-CM | POA: Diagnosis not present

## 2019-07-29 DIAGNOSIS — I10 Essential (primary) hypertension: Secondary | ICD-10-CM | POA: Diagnosis not present

## 2019-07-29 NOTE — Telephone Encounter (Signed)
Alden Electrical engineer with [facility] left v/m requesting verbal orders for [facility] speech therapy 1 x a wk for 4 wks to address cognition.

## 2019-07-29 NOTE — Telephone Encounter (Signed)
That is okay.

## 2019-07-29 NOTE — Telephone Encounter (Signed)
Left orders on verified VM. 

## 2019-08-01 DIAGNOSIS — H8190 Unspecified disorder of vestibular function, unspecified ear: Secondary | ICD-10-CM | POA: Diagnosis not present

## 2019-08-01 DIAGNOSIS — E876 Hypokalemia: Secondary | ICD-10-CM

## 2019-08-01 DIAGNOSIS — I69293 Ataxia following other nontraumatic intracranial hemorrhage: Secondary | ICD-10-CM | POA: Diagnosis not present

## 2019-08-01 DIAGNOSIS — Z9181 History of falling: Secondary | ICD-10-CM

## 2019-08-01 DIAGNOSIS — H532 Diplopia: Secondary | ICD-10-CM

## 2019-08-01 DIAGNOSIS — M810 Age-related osteoporosis without current pathological fracture: Secondary | ICD-10-CM | POA: Diagnosis not present

## 2019-08-01 DIAGNOSIS — G40909 Epilepsy, unspecified, not intractable, without status epilepticus: Secondary | ICD-10-CM | POA: Diagnosis not present

## 2019-08-01 DIAGNOSIS — I69391 Dysphagia following cerebral infarction: Secondary | ICD-10-CM | POA: Diagnosis not present

## 2019-08-01 DIAGNOSIS — I10 Essential (primary) hypertension: Secondary | ICD-10-CM | POA: Diagnosis not present

## 2019-08-01 DIAGNOSIS — E785 Hyperlipidemia, unspecified: Secondary | ICD-10-CM

## 2019-08-01 DIAGNOSIS — R1310 Dysphagia, unspecified: Secondary | ICD-10-CM | POA: Diagnosis not present

## 2019-08-01 DIAGNOSIS — I69298 Other sequelae of other nontraumatic intracranial hemorrhage: Secondary | ICD-10-CM | POA: Diagnosis not present

## 2019-08-01 DIAGNOSIS — K219 Gastro-esophageal reflux disease without esophagitis: Secondary | ICD-10-CM | POA: Diagnosis not present

## 2019-08-03 DIAGNOSIS — I69298 Other sequelae of other nontraumatic intracranial hemorrhage: Secondary | ICD-10-CM | POA: Diagnosis not present

## 2019-08-03 DIAGNOSIS — I69293 Ataxia following other nontraumatic intracranial hemorrhage: Secondary | ICD-10-CM | POA: Diagnosis not present

## 2019-08-03 DIAGNOSIS — K219 Gastro-esophageal reflux disease without esophagitis: Secondary | ICD-10-CM | POA: Diagnosis not present

## 2019-08-03 DIAGNOSIS — G40909 Epilepsy, unspecified, not intractable, without status epilepticus: Secondary | ICD-10-CM | POA: Diagnosis not present

## 2019-08-03 DIAGNOSIS — I69391 Dysphagia following cerebral infarction: Secondary | ICD-10-CM | POA: Diagnosis not present

## 2019-08-03 DIAGNOSIS — M810 Age-related osteoporosis without current pathological fracture: Secondary | ICD-10-CM | POA: Diagnosis not present

## 2019-08-03 DIAGNOSIS — I10 Essential (primary) hypertension: Secondary | ICD-10-CM | POA: Diagnosis not present

## 2019-08-03 DIAGNOSIS — H8190 Unspecified disorder of vestibular function, unspecified ear: Secondary | ICD-10-CM | POA: Diagnosis not present

## 2019-08-03 DIAGNOSIS — R1310 Dysphagia, unspecified: Secondary | ICD-10-CM | POA: Diagnosis not present

## 2019-08-04 DIAGNOSIS — I69298 Other sequelae of other nontraumatic intracranial hemorrhage: Secondary | ICD-10-CM | POA: Diagnosis not present

## 2019-08-04 DIAGNOSIS — I10 Essential (primary) hypertension: Secondary | ICD-10-CM | POA: Diagnosis not present

## 2019-08-04 DIAGNOSIS — I69391 Dysphagia following cerebral infarction: Secondary | ICD-10-CM | POA: Diagnosis not present

## 2019-08-04 DIAGNOSIS — M810 Age-related osteoporosis without current pathological fracture: Secondary | ICD-10-CM | POA: Diagnosis not present

## 2019-08-04 DIAGNOSIS — K219 Gastro-esophageal reflux disease without esophagitis: Secondary | ICD-10-CM | POA: Diagnosis not present

## 2019-08-04 DIAGNOSIS — H8190 Unspecified disorder of vestibular function, unspecified ear: Secondary | ICD-10-CM | POA: Diagnosis not present

## 2019-08-04 DIAGNOSIS — R1310 Dysphagia, unspecified: Secondary | ICD-10-CM | POA: Diagnosis not present

## 2019-08-04 DIAGNOSIS — I69293 Ataxia following other nontraumatic intracranial hemorrhage: Secondary | ICD-10-CM | POA: Diagnosis not present

## 2019-08-04 DIAGNOSIS — G40909 Epilepsy, unspecified, not intractable, without status epilepticus: Secondary | ICD-10-CM | POA: Diagnosis not present

## 2019-08-09 DIAGNOSIS — G40909 Epilepsy, unspecified, not intractable, without status epilepticus: Secondary | ICD-10-CM | POA: Diagnosis not present

## 2019-08-09 DIAGNOSIS — I69391 Dysphagia following cerebral infarction: Secondary | ICD-10-CM | POA: Diagnosis not present

## 2019-08-09 DIAGNOSIS — K219 Gastro-esophageal reflux disease without esophagitis: Secondary | ICD-10-CM | POA: Diagnosis not present

## 2019-08-09 DIAGNOSIS — I69293 Ataxia following other nontraumatic intracranial hemorrhage: Secondary | ICD-10-CM | POA: Diagnosis not present

## 2019-08-09 DIAGNOSIS — I10 Essential (primary) hypertension: Secondary | ICD-10-CM | POA: Diagnosis not present

## 2019-08-09 DIAGNOSIS — I69298 Other sequelae of other nontraumatic intracranial hemorrhage: Secondary | ICD-10-CM | POA: Diagnosis not present

## 2019-08-09 DIAGNOSIS — H8190 Unspecified disorder of vestibular function, unspecified ear: Secondary | ICD-10-CM | POA: Diagnosis not present

## 2019-08-09 DIAGNOSIS — M810 Age-related osteoporosis without current pathological fracture: Secondary | ICD-10-CM | POA: Diagnosis not present

## 2019-08-09 DIAGNOSIS — R1310 Dysphagia, unspecified: Secondary | ICD-10-CM | POA: Diagnosis not present

## 2019-08-10 DIAGNOSIS — G40909 Epilepsy, unspecified, not intractable, without status epilepticus: Secondary | ICD-10-CM | POA: Diagnosis not present

## 2019-08-10 DIAGNOSIS — I69298 Other sequelae of other nontraumatic intracranial hemorrhage: Secondary | ICD-10-CM | POA: Diagnosis not present

## 2019-08-10 DIAGNOSIS — I10 Essential (primary) hypertension: Secondary | ICD-10-CM | POA: Diagnosis not present

## 2019-08-10 DIAGNOSIS — M810 Age-related osteoporosis without current pathological fracture: Secondary | ICD-10-CM | POA: Diagnosis not present

## 2019-08-10 DIAGNOSIS — I69293 Ataxia following other nontraumatic intracranial hemorrhage: Secondary | ICD-10-CM | POA: Diagnosis not present

## 2019-08-10 DIAGNOSIS — H8190 Unspecified disorder of vestibular function, unspecified ear: Secondary | ICD-10-CM | POA: Diagnosis not present

## 2019-08-10 DIAGNOSIS — I69391 Dysphagia following cerebral infarction: Secondary | ICD-10-CM | POA: Diagnosis not present

## 2019-08-10 DIAGNOSIS — K219 Gastro-esophageal reflux disease without esophagitis: Secondary | ICD-10-CM | POA: Diagnosis not present

## 2019-08-10 DIAGNOSIS — R1310 Dysphagia, unspecified: Secondary | ICD-10-CM | POA: Diagnosis not present

## 2019-08-11 DIAGNOSIS — I69391 Dysphagia following cerebral infarction: Secondary | ICD-10-CM | POA: Diagnosis not present

## 2019-08-11 DIAGNOSIS — K219 Gastro-esophageal reflux disease without esophagitis: Secondary | ICD-10-CM | POA: Diagnosis not present

## 2019-08-11 DIAGNOSIS — I69293 Ataxia following other nontraumatic intracranial hemorrhage: Secondary | ICD-10-CM | POA: Diagnosis not present

## 2019-08-11 DIAGNOSIS — M810 Age-related osteoporosis without current pathological fracture: Secondary | ICD-10-CM | POA: Diagnosis not present

## 2019-08-11 DIAGNOSIS — G40909 Epilepsy, unspecified, not intractable, without status epilepticus: Secondary | ICD-10-CM | POA: Diagnosis not present

## 2019-08-11 DIAGNOSIS — H8190 Unspecified disorder of vestibular function, unspecified ear: Secondary | ICD-10-CM | POA: Diagnosis not present

## 2019-08-11 DIAGNOSIS — I69298 Other sequelae of other nontraumatic intracranial hemorrhage: Secondary | ICD-10-CM | POA: Diagnosis not present

## 2019-08-11 DIAGNOSIS — R1310 Dysphagia, unspecified: Secondary | ICD-10-CM | POA: Diagnosis not present

## 2019-08-11 DIAGNOSIS — I10 Essential (primary) hypertension: Secondary | ICD-10-CM | POA: Diagnosis not present

## 2019-08-13 ENCOUNTER — Other Ambulatory Visit: Payer: Self-pay

## 2019-08-13 ENCOUNTER — Emergency Department
Admission: EM | Admit: 2019-08-13 | Discharge: 2019-08-13 | Disposition: A | Discharge status: Home / Self Care | Payer: Medicare PPO | Attending: Emergency Medicine | Admitting: Emergency Medicine

## 2019-08-13 ENCOUNTER — Emergency Department: Payer: Medicare PPO

## 2019-08-13 ENCOUNTER — Encounter: Payer: Self-pay | Admitting: Emergency Medicine

## 2019-08-13 DIAGNOSIS — R1310 Dysphagia, unspecified: Secondary | ICD-10-CM | POA: Diagnosis not present

## 2019-08-13 DIAGNOSIS — Z79899 Other long term (current) drug therapy: Secondary | ICD-10-CM | POA: Diagnosis not present

## 2019-08-13 DIAGNOSIS — I69391 Dysphagia following cerebral infarction: Secondary | ICD-10-CM | POA: Diagnosis not present

## 2019-08-13 DIAGNOSIS — M79652 Pain in left thigh: Secondary | ICD-10-CM | POA: Diagnosis not present

## 2019-08-13 DIAGNOSIS — G40909 Epilepsy, unspecified, not intractable, without status epilepticus: Secondary | ICD-10-CM | POA: Diagnosis not present

## 2019-08-13 DIAGNOSIS — I69293 Ataxia following other nontraumatic intracranial hemorrhage: Secondary | ICD-10-CM | POA: Diagnosis not present

## 2019-08-13 DIAGNOSIS — I10 Essential (primary) hypertension: Secondary | ICD-10-CM | POA: Diagnosis not present

## 2019-08-13 DIAGNOSIS — M810 Age-related osteoporosis without current pathological fracture: Secondary | ICD-10-CM | POA: Diagnosis not present

## 2019-08-13 DIAGNOSIS — Y9301 Activity, walking, marching and hiking: Secondary | ICD-10-CM | POA: Insufficient documentation

## 2019-08-13 DIAGNOSIS — H8190 Unspecified disorder of vestibular function, unspecified ear: Secondary | ICD-10-CM | POA: Diagnosis not present

## 2019-08-13 DIAGNOSIS — W010XXA Fall on same level from slipping, tripping and stumbling without subsequent striking against object, initial encounter: Secondary | ICD-10-CM | POA: Diagnosis not present

## 2019-08-13 DIAGNOSIS — S3992XA Unspecified injury of lower back, initial encounter: Secondary | ICD-10-CM | POA: Diagnosis not present

## 2019-08-13 DIAGNOSIS — S79922A Unspecified injury of left thigh, initial encounter: Secondary | ICD-10-CM | POA: Diagnosis not present

## 2019-08-13 DIAGNOSIS — Y999 Unspecified external cause status: Secondary | ICD-10-CM | POA: Insufficient documentation

## 2019-08-13 DIAGNOSIS — K219 Gastro-esophageal reflux disease without esophagitis: Secondary | ICD-10-CM | POA: Diagnosis not present

## 2019-08-13 DIAGNOSIS — I619 Nontraumatic intracerebral hemorrhage, unspecified: Secondary | ICD-10-CM | POA: Diagnosis not present

## 2019-08-13 DIAGNOSIS — S300XXA Contusion of lower back and pelvis, initial encounter: Secondary | ICD-10-CM | POA: Insufficient documentation

## 2019-08-13 DIAGNOSIS — I69298 Other sequelae of other nontraumatic intracranial hemorrhage: Secondary | ICD-10-CM | POA: Diagnosis not present

## 2019-08-13 DIAGNOSIS — Y929 Unspecified place or not applicable: Secondary | ICD-10-CM | POA: Diagnosis not present

## 2019-08-13 DIAGNOSIS — W19XXXA Unspecified fall, initial encounter: Secondary | ICD-10-CM

## 2019-08-13 IMAGING — CR DG PELVIS 1-2V
1 series · 1 of 1 positions shown · non-contrast
Comparison: None.

CLINICAL DATA: Pt presents to the ED for tailbone pain. Pt had a
recent mechanical fall around [3M] today where she landed on her
buttocks. Denies head injury or LOC. Pt c/o [DATE] bottom pain. No
bruising noted to the bottom at this time.

EXAM:
PELVIS - 1-2 VIEW

[dg pelvis 1-2 views]
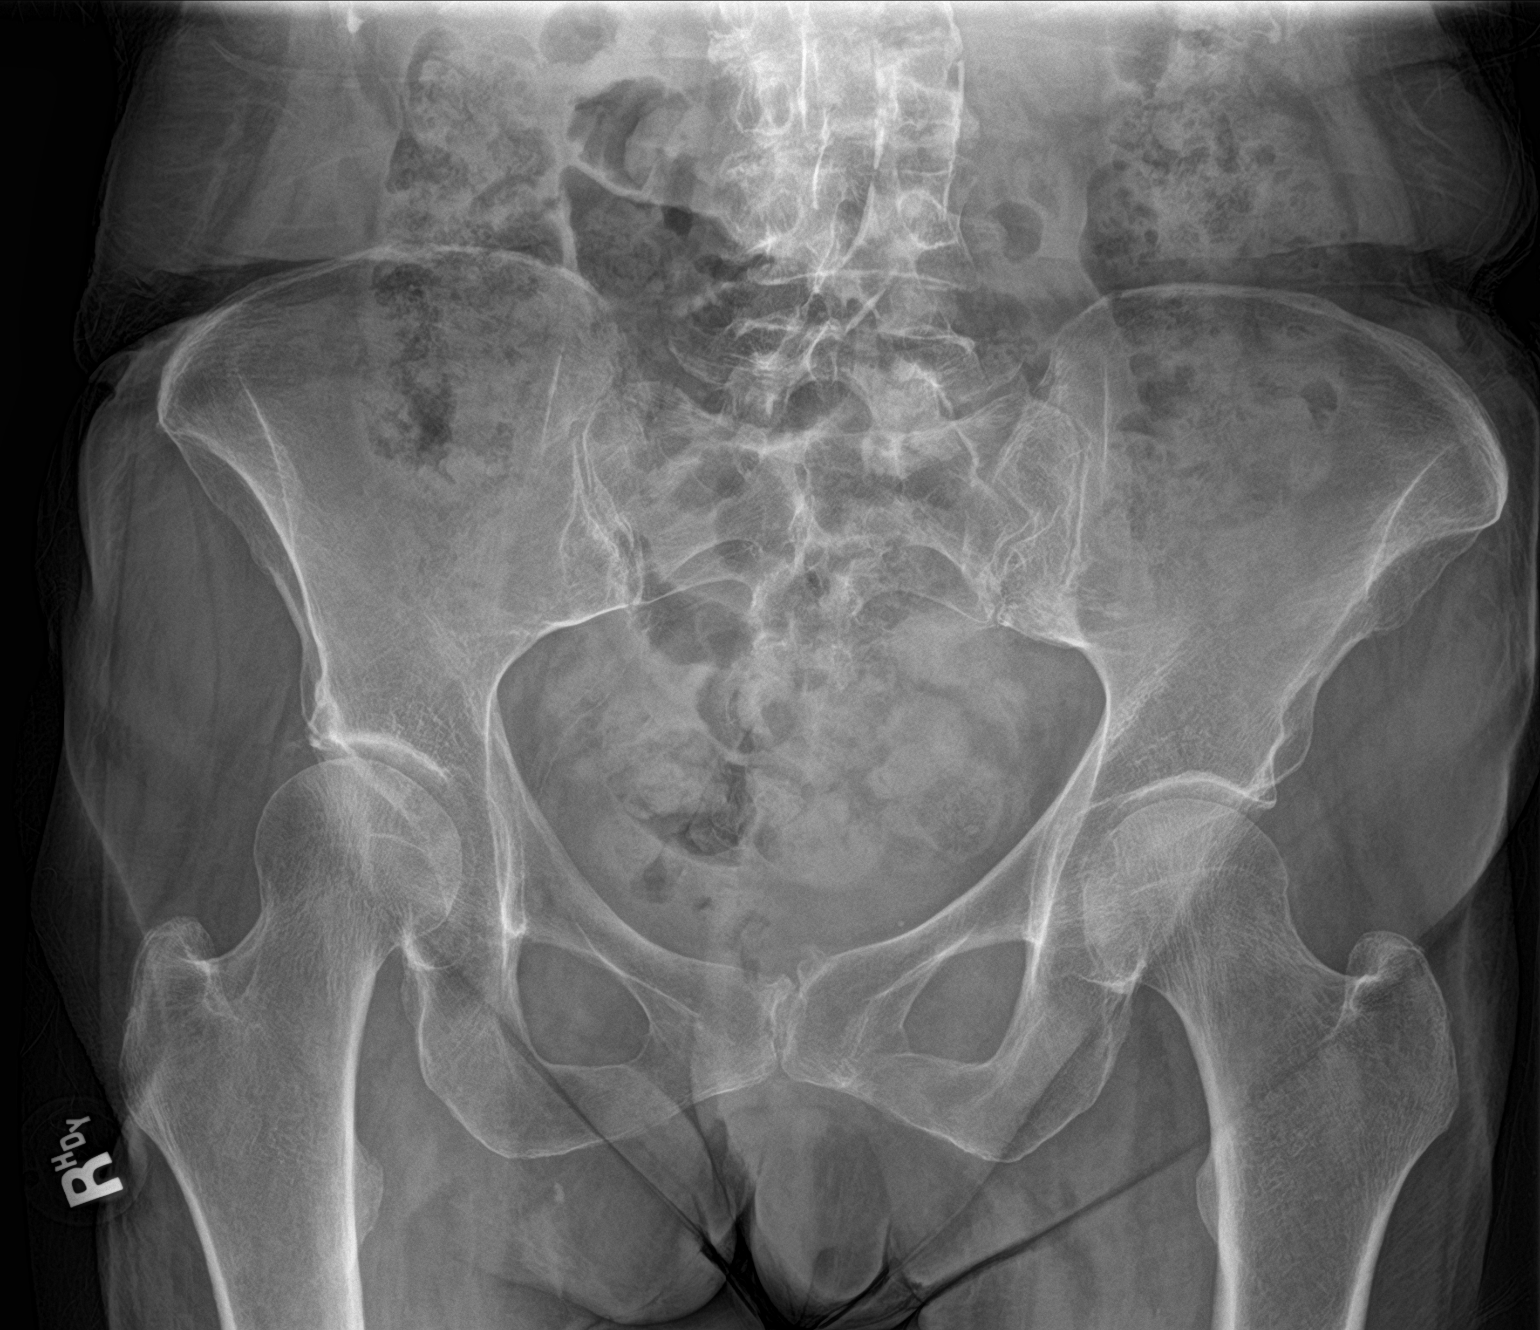

[1 of 1 positions shown; findings below may reference images not displayed]

FINDINGS: No fracture or bone lesion.

Hip joints, SI joints and symphysis pubis are normally spaced and
aligned.

Skeletal structures are demineralized.

Soft tissues are unremarkable.
IMPRESSION: No fracture or acute finding.

## 2019-08-13 IMAGING — CR DG FEMUR 2+V*R*
1 series · 4 of 4 positions shown · non-contrast
Comparison: None.

CLINICAL DATA: Pt presents to the ED for tailbone pain. Pt had a
recent mechanical fall around [BM] today where she landed on her
buttocks. Denies head injury or LOC. Pt c/o [DATE] bottom pain. No
bruising noted to the bottom at this time.

EXAM:
RIGHT FEMUR 2 VIEWS

[Series 1: dg femur, min 2 views right · 0.14mm/px · 4 of 4 slices shown]
[im 1/4]
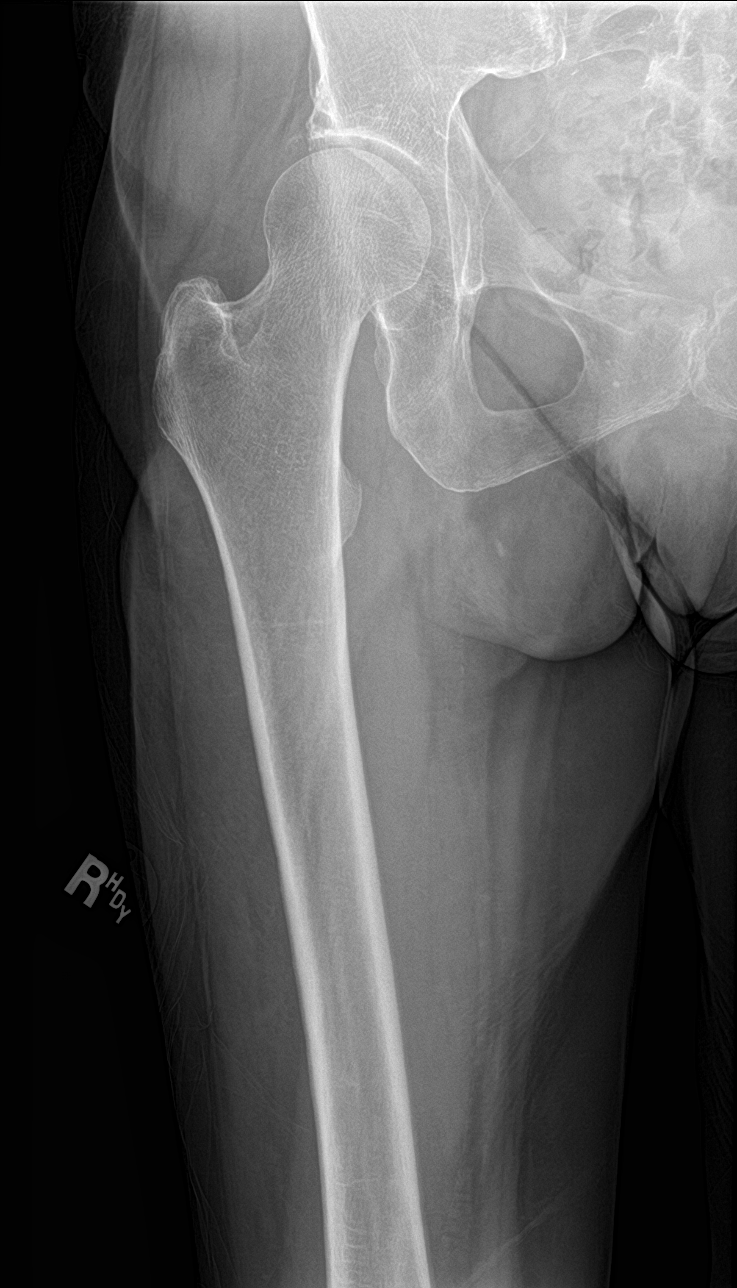
[im 2/4]
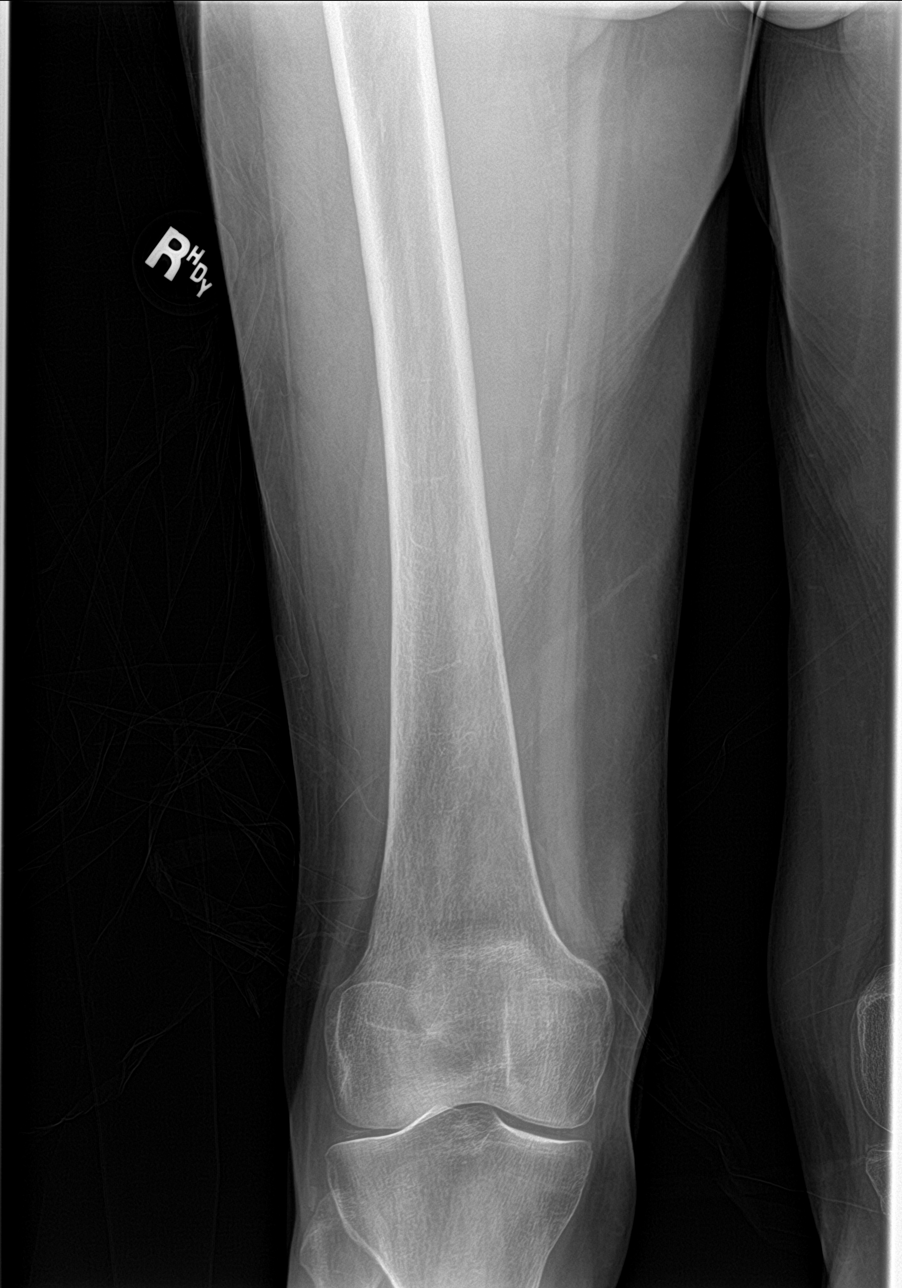
[im 3/4]
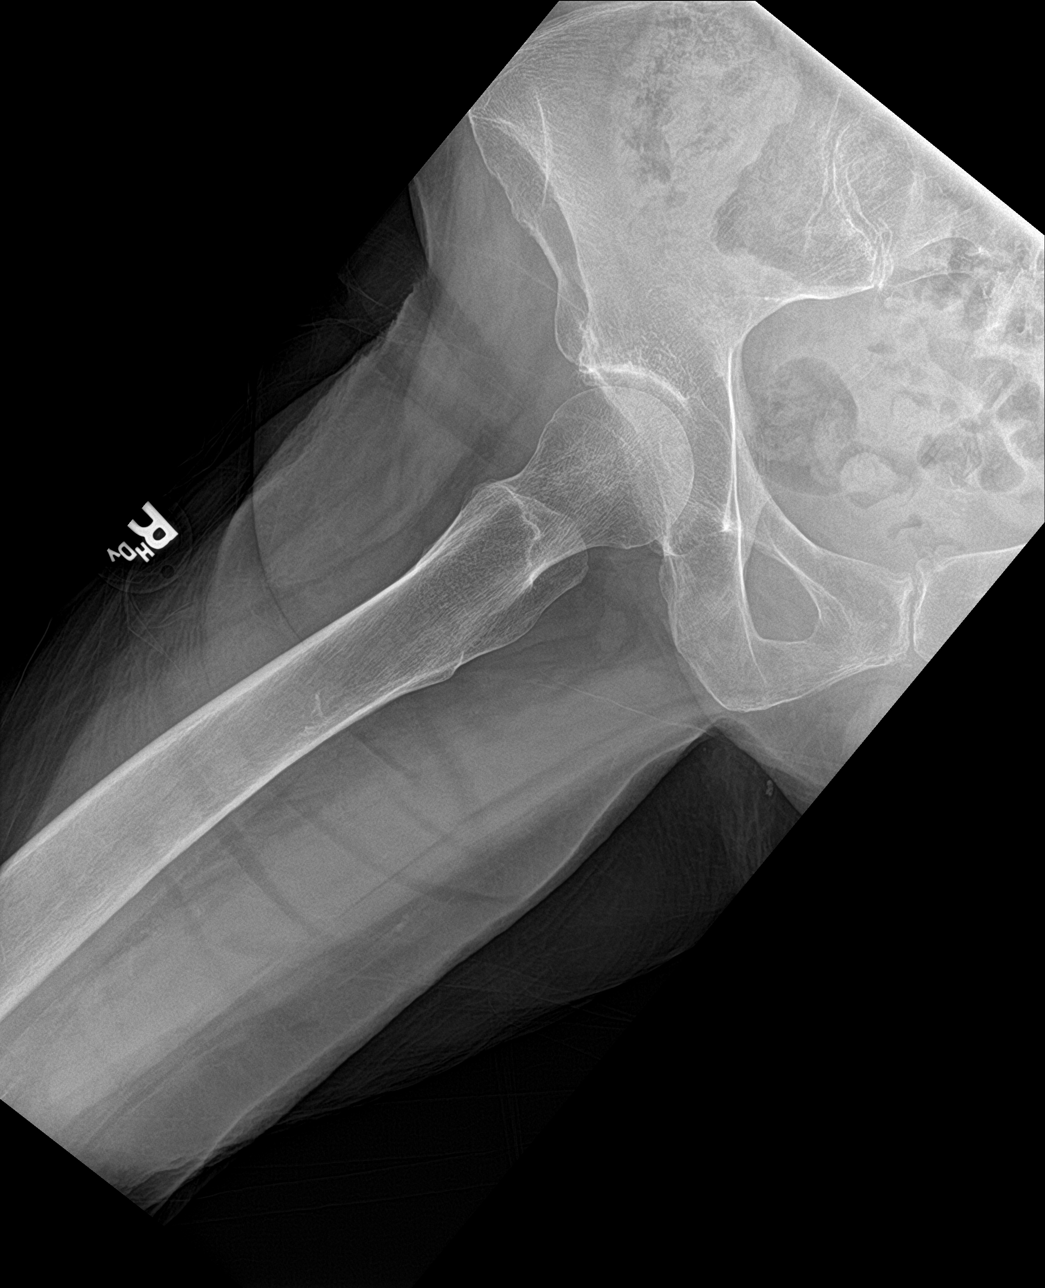
[im 4/4]
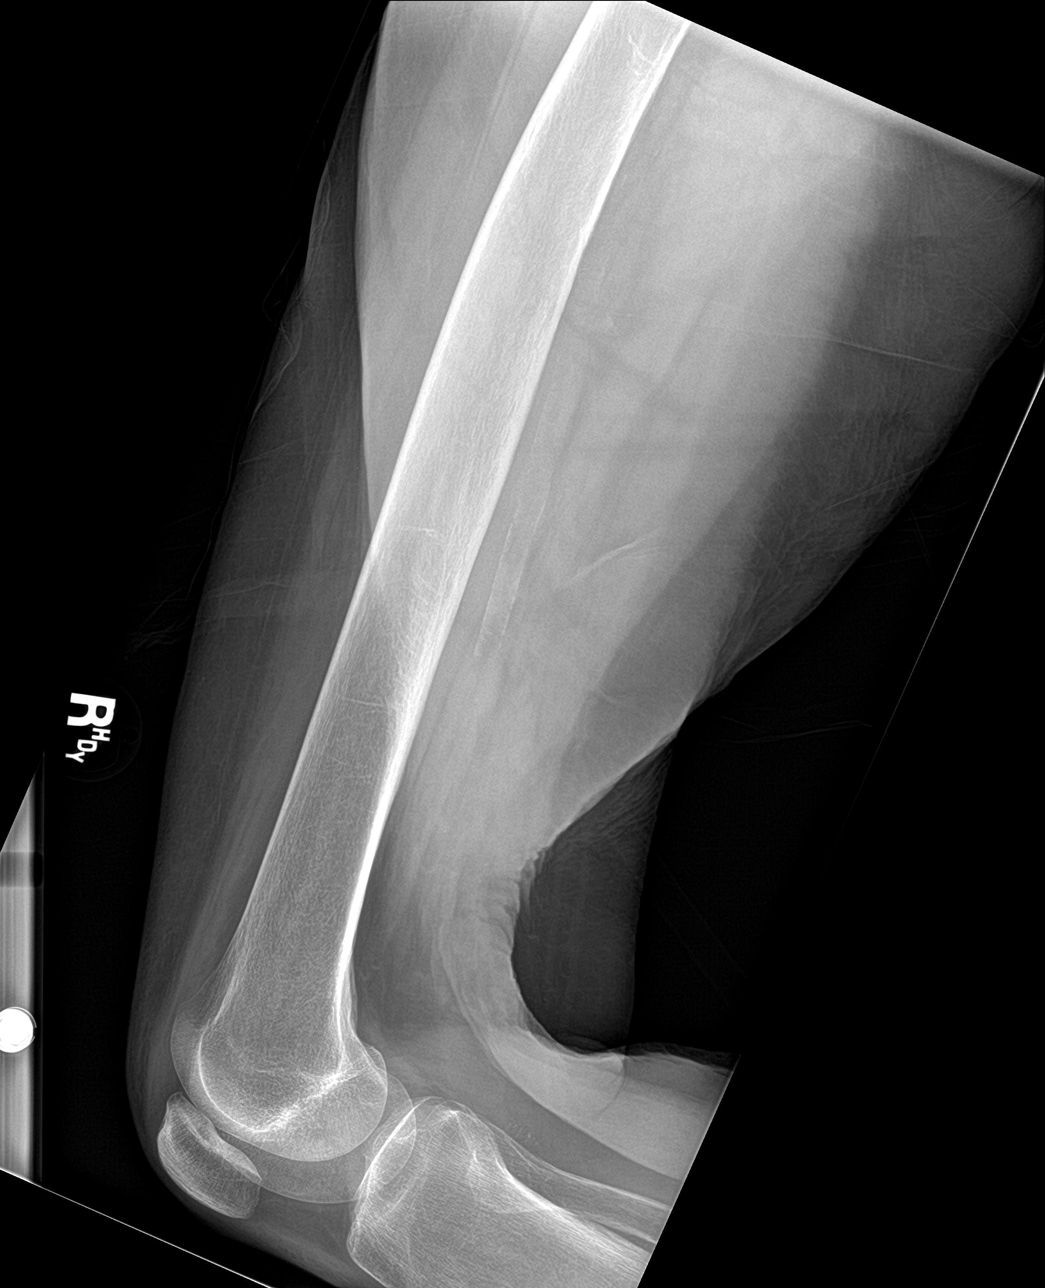

[4 of 4 positions shown; findings below may reference images not displayed]

FINDINGS: No fracture or bone lesion.

Hip joint normally spaced and aligned. Small area of subchondral
cystic change along the superolateral right acetabulum. No other
degenerative change.

Soft tissues are unremarkable.
IMPRESSION: No fracture or acute finding.

## 2019-08-13 IMAGING — CR DG SACRUM/COCCYX 2+V
1 series · 3 of 3 positions shown · non-contrast
Comparison: None.

CLINICAL DATA: Pt presents to the ED for tailbone pain. Pt had a
recent mechanical fall around [6F] today where she landed on her
buttocks. Denies head injury or LOC. Pt c/o [DATE] bottom pain. No
bruising noted to the bottom at this time.

EXAM:
SACRUM AND COCCYX - 2+ VIEW

[Series 1: dg sacrum/coccyx · 0.14mm/px · 3 of 3 slices shown]
[im 1/3]
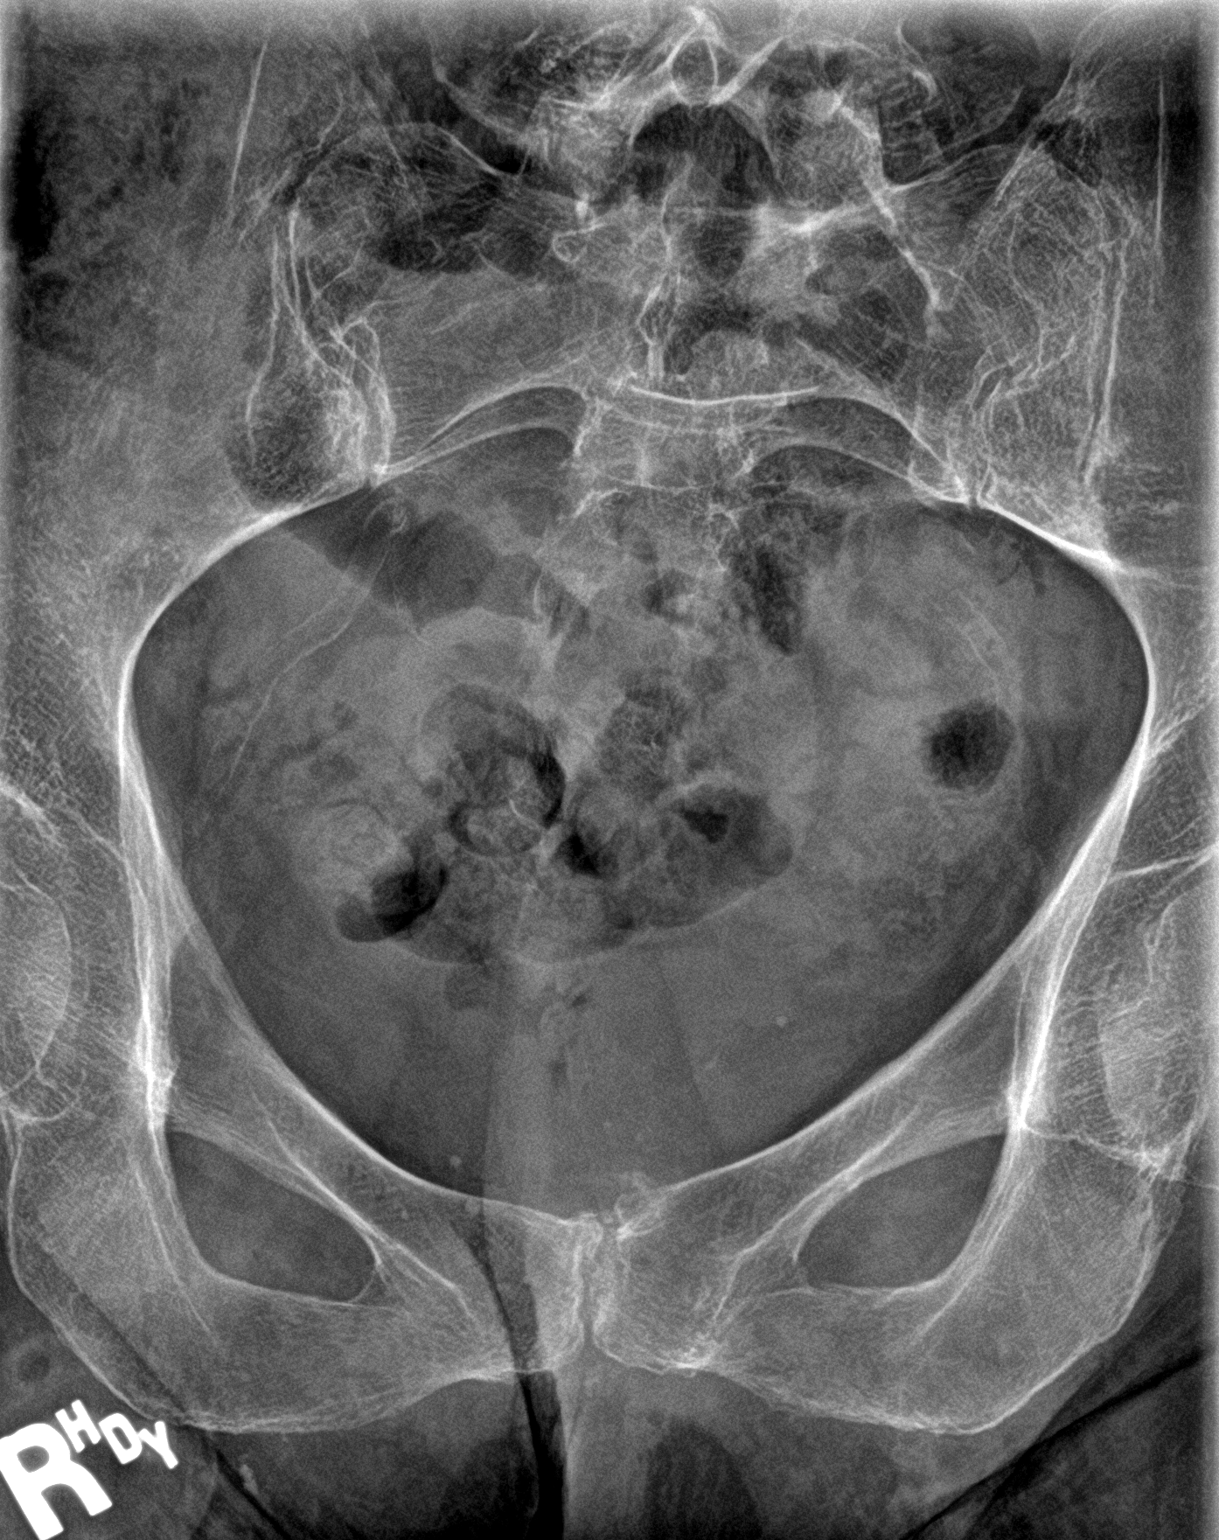
[im 2/3]
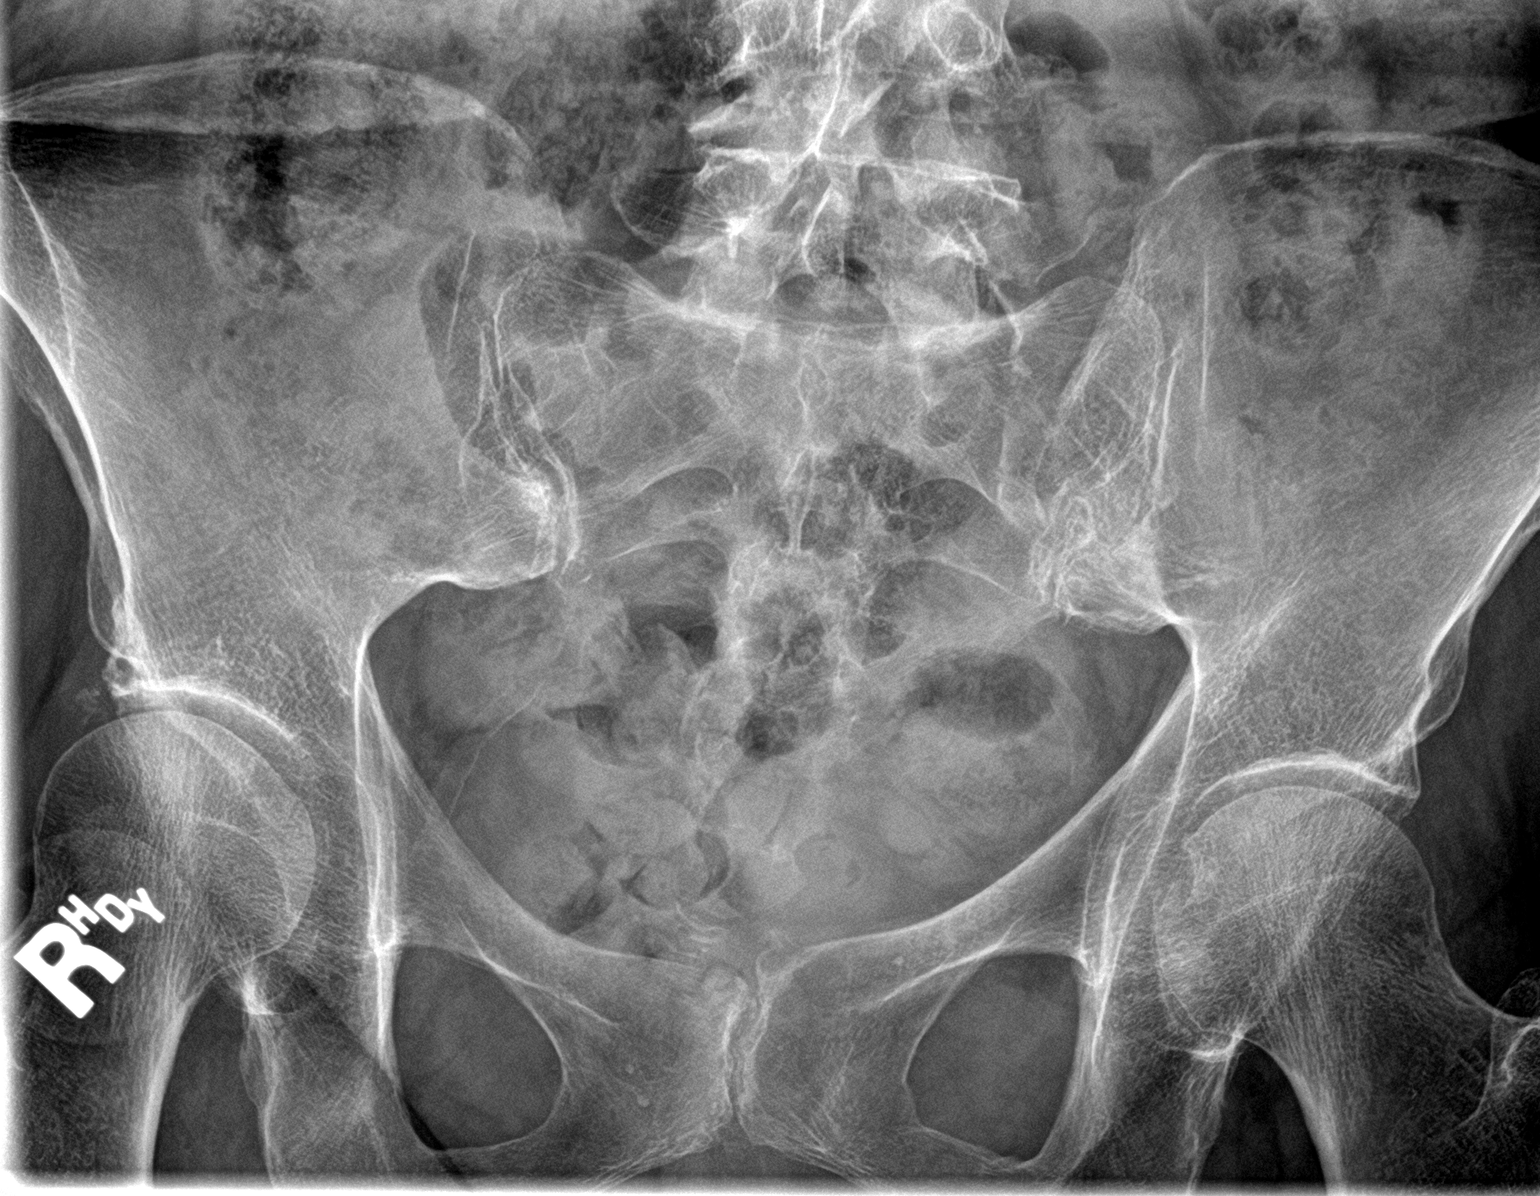
[im 3/3]
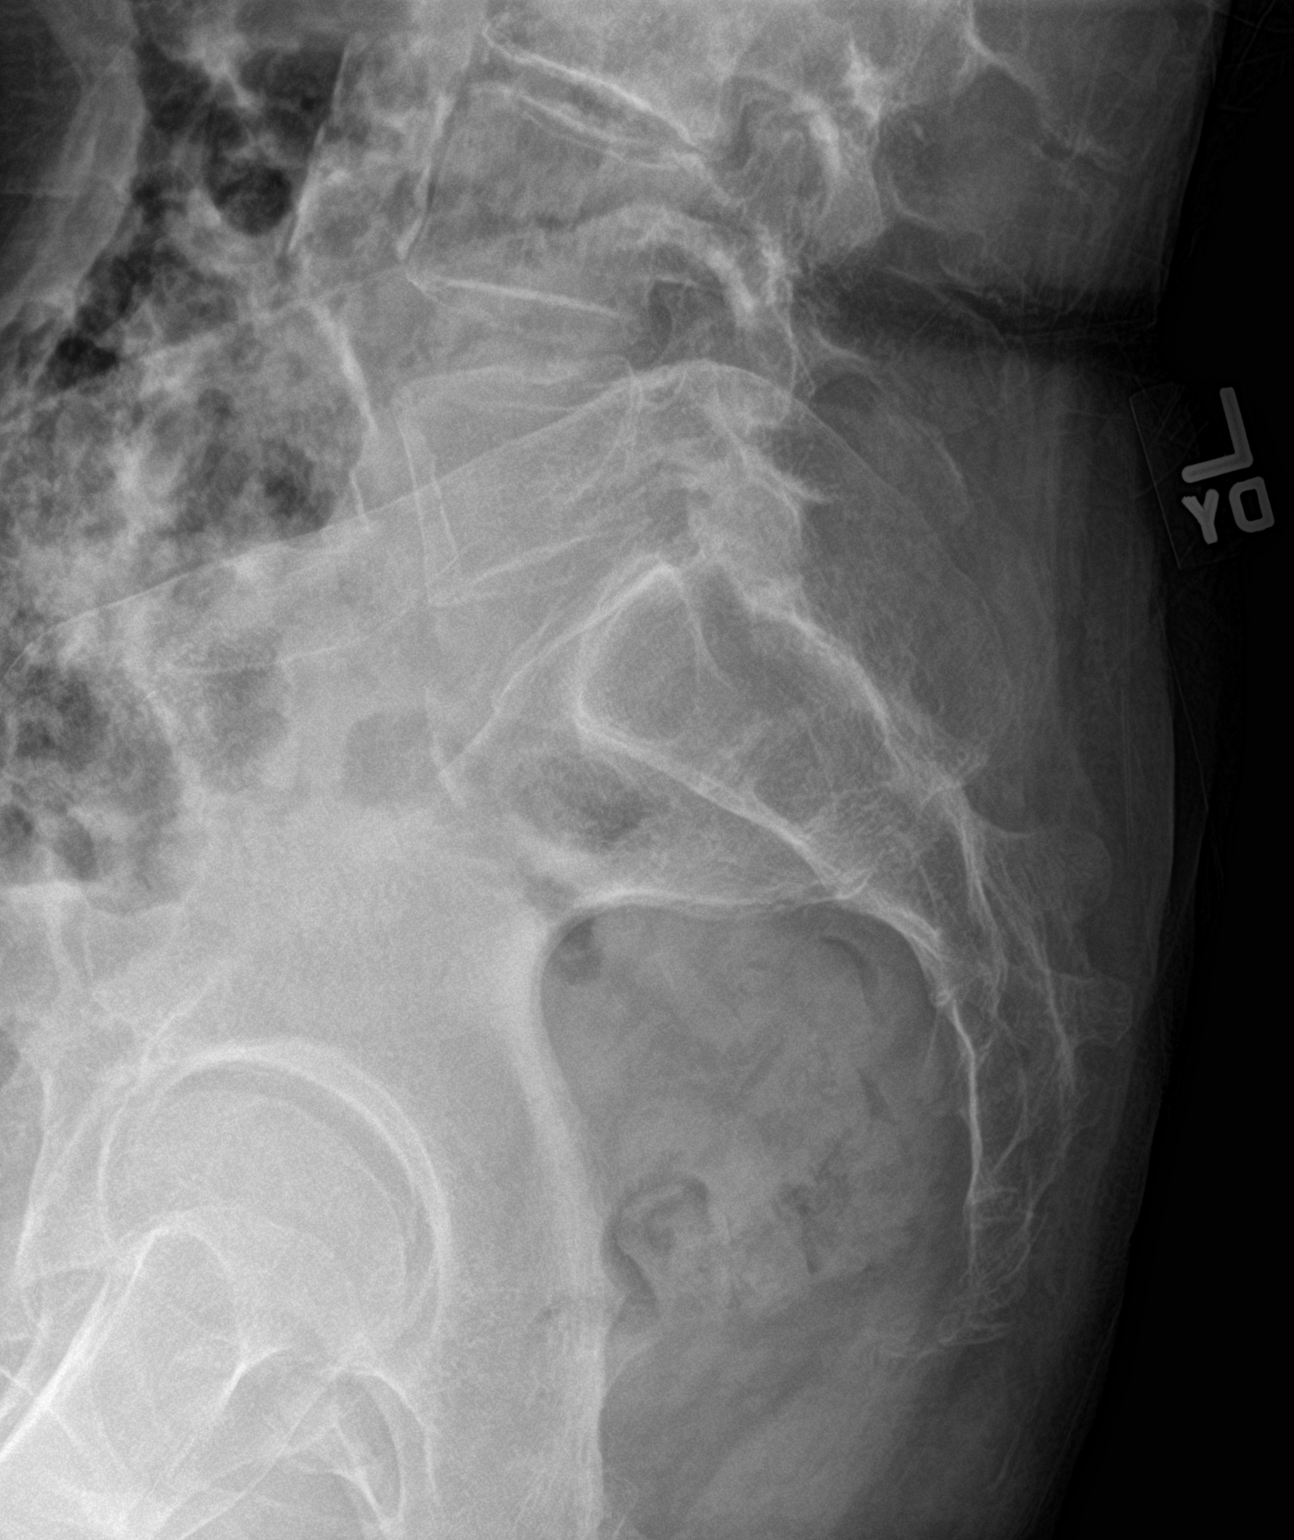

[3 of 3 positions shown; findings below may reference images not displayed]

FINDINGS: No fracture or bone lesion.

SI joints normally spaced and aligned.

Skeletal structures are demineralized.

Soft tissues are unremarkable.
IMPRESSION: No fracture or acute finding.

## 2019-08-13 IMAGING — CR DG LUMBAR SPINE 2-3V
1 series · 3 of 3 positions shown · non-contrast
Comparison: Chest radiograph, [DATE]

CLINICAL DATA: Pt presents to the ED for tailbone pain. Pt had a
recent mechanical fall around [FG] today where she landed on her
buttocks. Denies head injury or LOC. Pt c/o [DATE] bottom pain. No
bruising noted to the bottom at this time.

EXAM:
LUMBAR SPINE - 2-3 VIEW

[Series 1: dg lumbar spine 2-3 views · 0.14mm/px · 3 of 3 slices shown]
[im 1/3]
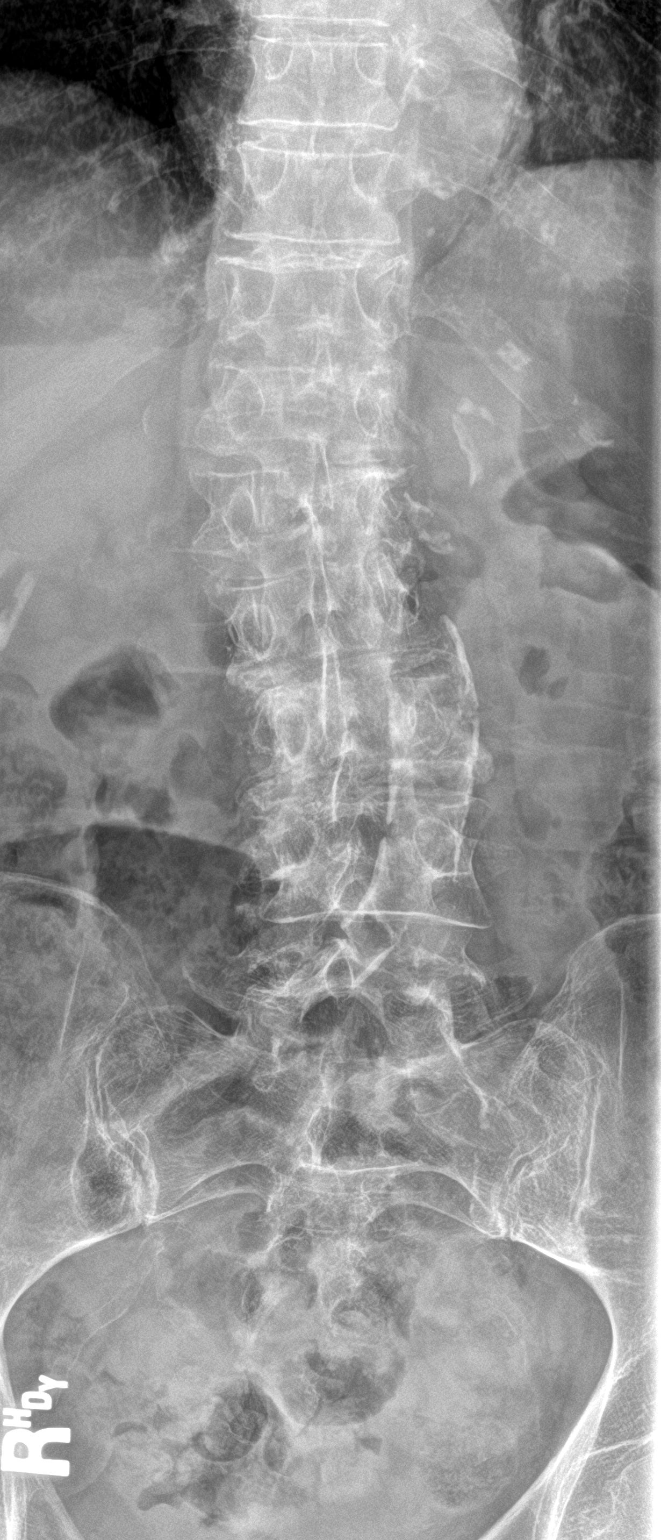
[im 2/3]
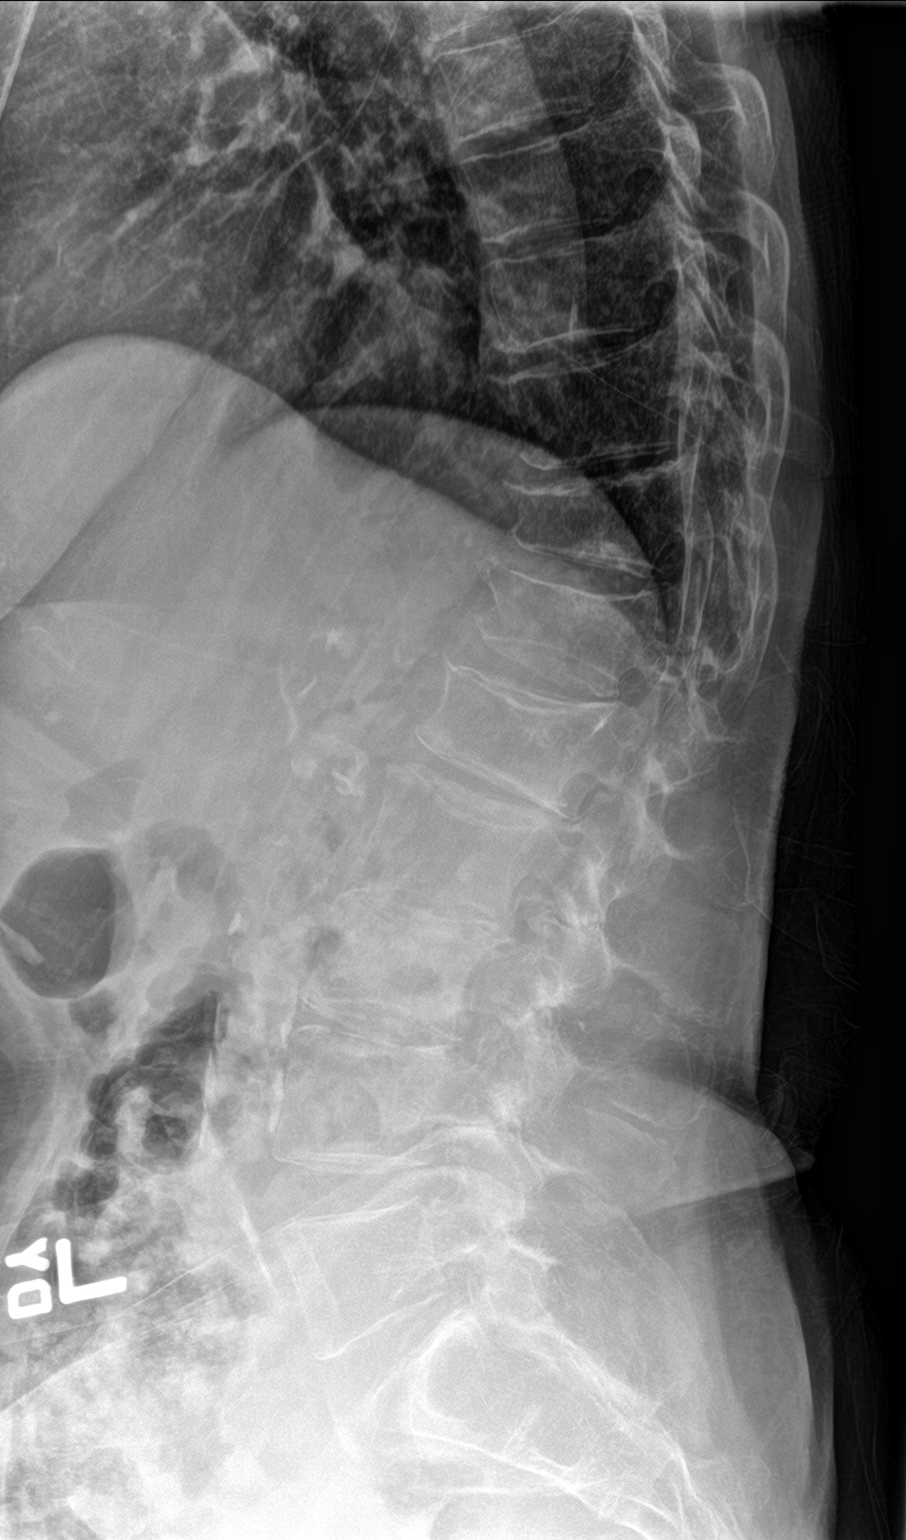
[im 3/3]
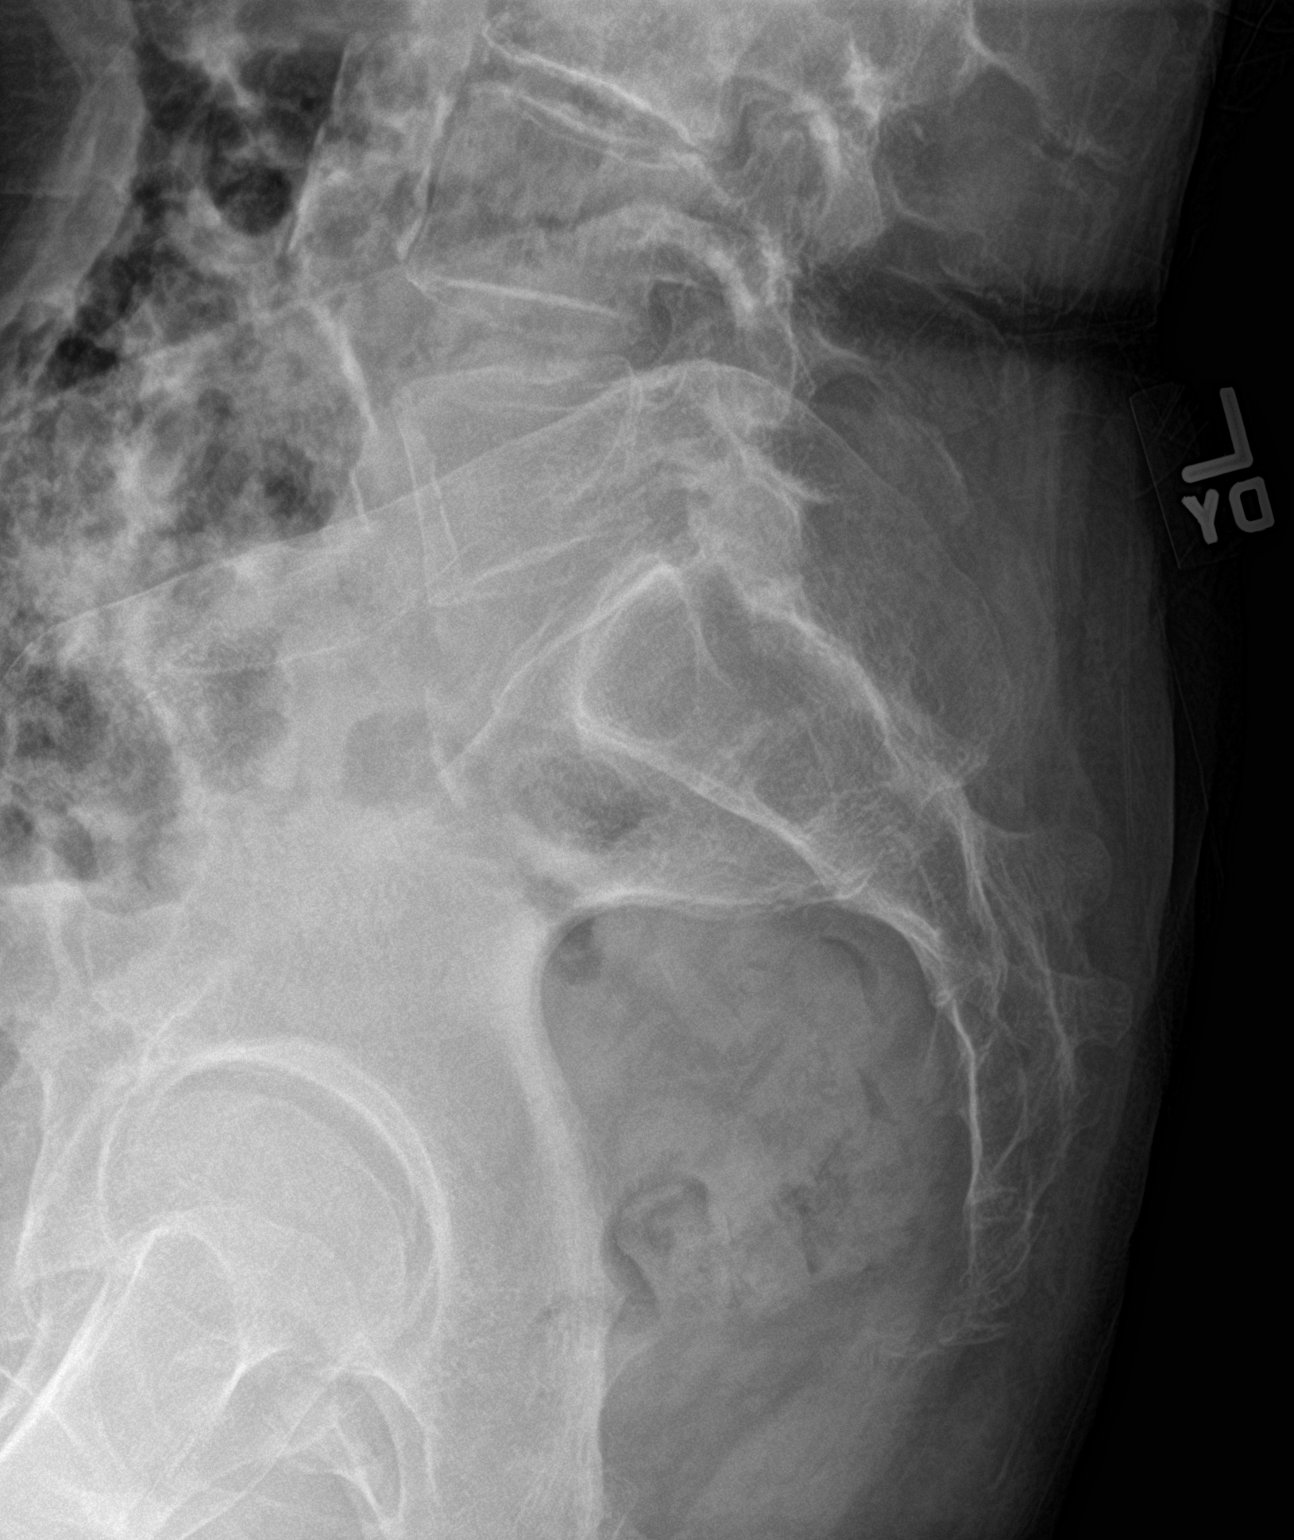

[3 of 3 positions shown; findings below may reference images not displayed]

FINDINGS: There are compression fractures of T11, T12 and L1, with the T12
fracture present on the prior lateral chest radiograph, the L1 and
L2 fractures possibly new.

No other fractures.  No bone lesion.  No spondylolisthesis.

Mild curvature of the lumbar spine, convex the left at L4 and to the
right at L1-L2.

Skeletal structures are diffusely demineralized.

Moderate loss of disc height from L1-L2 through L3-L4.

Atherosclerotic calcifications along a normal caliber aorta.
IMPRESSION: 1. Chronic compression fracture of T11.
2. Compression fractures of T12 and L1 which may be recent, not
evident on the prior lateral chest radiograph. No other evidence of
an acute abnormality.

## 2019-08-13 IMAGING — CR DG FEMUR 2+V*L*
1 series · 4 of 4 positions shown · non-contrast
Comparison: None.

CLINICAL DATA: Pt presents to the ED for tailbone pain. Pt had a
recent mechanical fall around [YL] today where she landed on her
buttocks. Denies head injury or LOC. Pt c/o [DATE] bottom pain. No
bruising noted to the bottom at this time.

EXAM:
LEFT FEMUR 2 VIEWS

[Series 1: dg femur min 2 views left · 0.14mm/px · 4 of 4 slices shown]
[im 1/4]
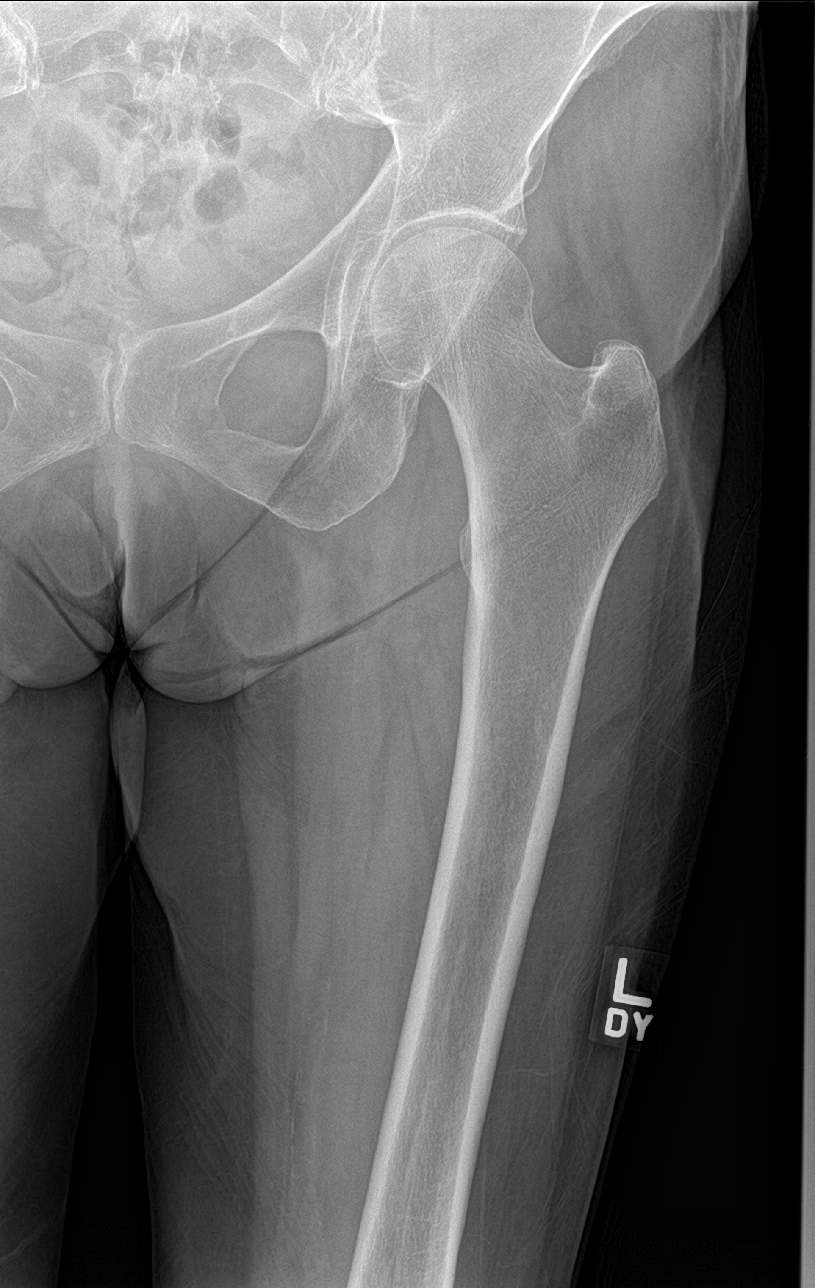
[im 2/4]
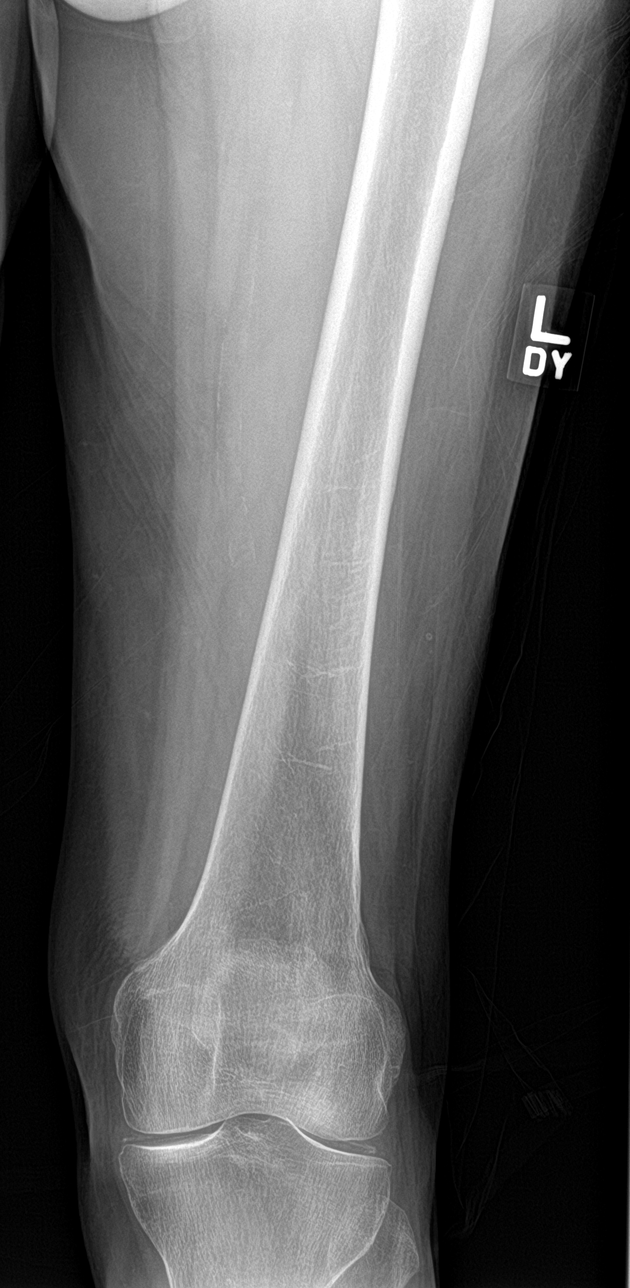
[im 3/4]
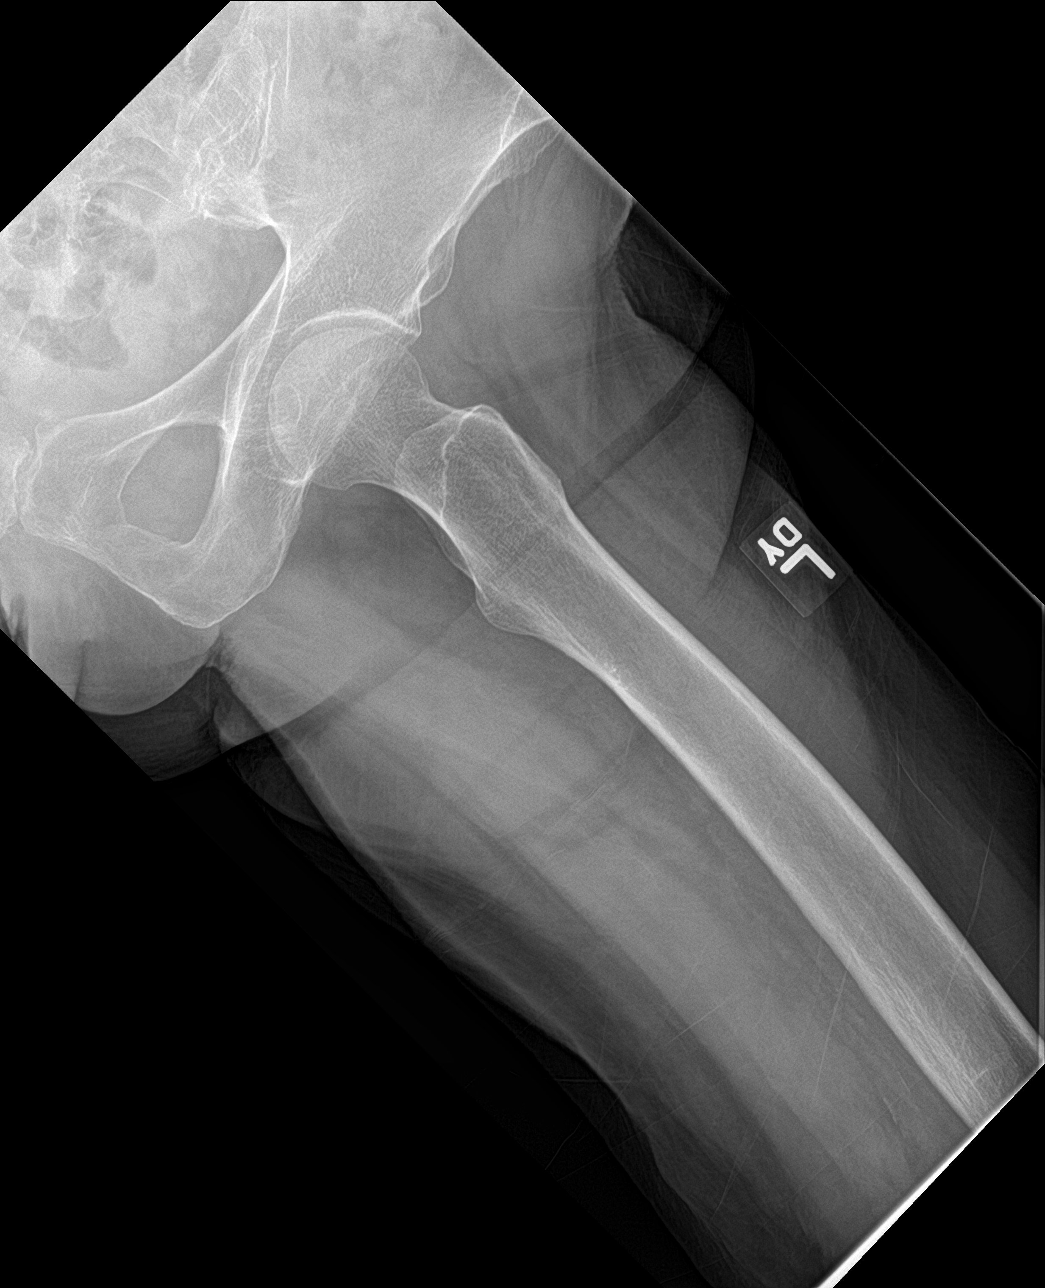
[im 4/4]
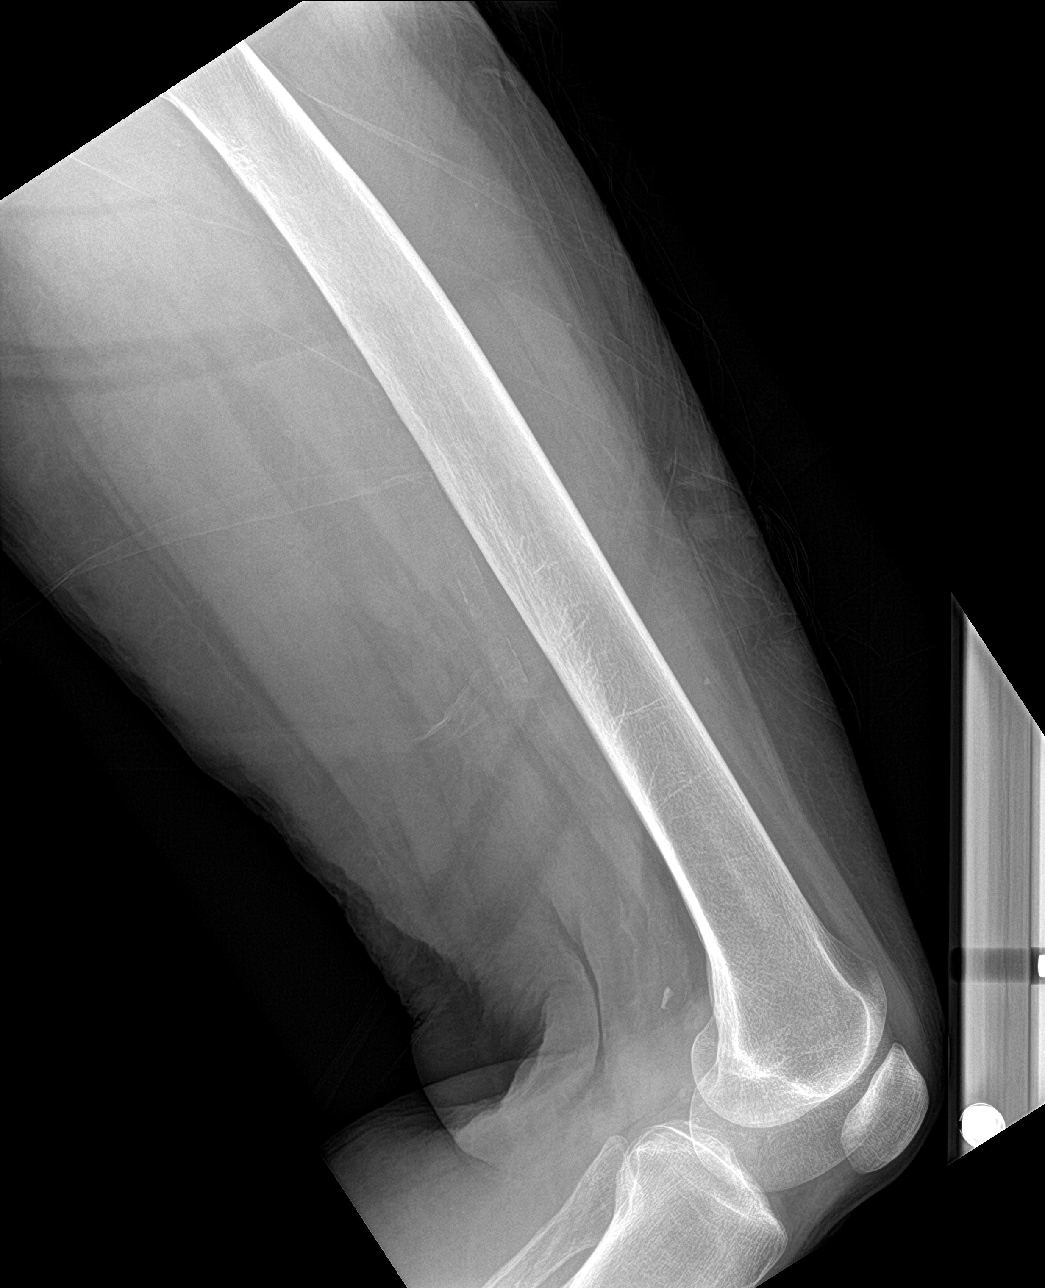

[4 of 4 positions shown; findings below may reference images not displayed]

FINDINGS: No fracture or bone lesion.

Hip joint normally spaced and aligned.  No arthropathic changes.

Soft tissues are unremarkable.
IMPRESSION: Negative.

## 2019-08-13 MED ORDER — OXYCODONE-ACETAMINOPHEN 5-325 MG PO TABS
1.0000 | ORAL_TABLET | Freq: Once | ORAL | Status: AC
  Administered 2019-08-13: 14:00:00 1 via ORAL
  Filled 2019-08-13: qty 1

## 2019-08-13 MED ORDER — OXYCODONE-ACETAMINOPHEN 5-325 MG PO TABS: 1.0000 | ORAL_TABLET | Freq: Four times a day (QID) | ORAL | 0 refills | Status: DC | PRN

## 2019-08-13 MED ORDER — PREDNISONE 50 MG PO TABS: 50.0000 mg | ORAL_TABLET | Freq: Every day | ORAL | 0 refills | Status: DC

## 2019-08-13 MED ORDER — METHOCARBAMOL 500 MG PO TABS: 500.0000 mg | ORAL_TABLET | Freq: Four times a day (QID) | ORAL | 0 refills | Status: DC

## 2019-08-13 MED ORDER — ONDANSETRON 4 MG PO TBDP
8.0000 mg | ORAL_TABLET | Freq: Once | ORAL | Status: AC
  Administered 2019-08-13: 8 mg via ORAL
  Filled 2019-08-13: qty 2

## 2019-08-13 NOTE — ED Triage Notes (Signed)
Fell onto buttocks today. Gets pt for stroke 3 months ago and uses walker at home.  Tailbone pain

## 2019-08-13 NOTE — ED Notes (Signed)
Pt states was using her walker and turned it too sharp and fell. Denies loc. States since stroke she is always a little dizzy and takes antivert for it. Has htn and took those pills today and is hypotensive.

## 2019-08-13 NOTE — ED Provider Notes (Signed)
[facility] Emergency Department Provider Note  ____________________________________________  Time seen: Approximately 1:28 PM  I have reviewed the triage vital signs and the nursing notes.   HISTORY  Chief Complaint Fall    HPI Tanya Knight is a 79 y.o. female who presents the emergency department for evaluation of lower back/coccyx/bilateral hip pain after mechanical fall.  Patient suffered from a stroke 2 months ago.  She is currently undergoing PT and OT.  Patient states that she has been progressing very well.  She is able to ambulate with a walker.  Patient states that today she was walking, took too fast of a turn, lost her balance and fell.  Patient states that she fell sideways, however landed on her lower back/tailbone.  Patient states that she did not hit her head.  She did not lose consciousness.  She states that it was truly mechanical fall even though she has experienced dizziness following her stroke.  Patient states that she takes medication for dizziness.  Had no increase in dizziness.  Patient did not feel weak.  She states that she took too fast to return, lost her balance and fell.  Patient is currently complaining of lower back pain with bilateral hip and coccyx pain.  No radicular symptoms.  No bowel or bladder dysfunction, saddle anesthesia or paresthesias.  Patient denies any other complaint other than musculoskeletal pain at this time.  Patient has a medical history described below with hypertension, seizure disorder, history of CVA, GERD, osteoporosis.         Past Medical History:  Diagnosis Date  . Fracture of metatarsal    Repair of fractured R metatarsal --Dr Duda---4/08  . GERD (gastroesophageal reflux disease)   . Hypertension   . Melanoma in situ [facility]) 7/17   Dr Kellie Moor  . Osteoporosis   . Seizure disorder ([place])   . Seizures ([place])   . Vitamin D deficiency     Patient Active Problem List   Diagnosis Date Noted  .  Elevated BUN   . Dysphagia, post-stroke   . Vascular headache   . Hypokalemia   . Seizures ([place])   . Essential hypertension   . Hyperlipidemia 06/14/2019  . Cerebellar vermis ICH with SDH and SAH, likely hypertensive 06/10/2019  . Hyponatremia 09/08/2014  . Advance directive discussed with patient 09/08/2014  . Routine general medical examination at a health care facility 09/03/2011  . Essential hypertension, benign 10/12/2006  . GERD 10/12/2006  . Osteoporosis 10/12/2006  . Seizure disorder ([place]) 10/12/2006    Past Surgical History:  Procedure Laterality Date  . CATARACT EXTRACTION, BILATERAL Bilateral 2014  . EYE SURGERY     Obstructed tear duct  . FOOT SURGERY  2008  . MELANOMA EXCISION Left 11/2015   left lower leg  . TEAR DUCT PROBING  10/12   temporary stent  . [place]    Prior to Admission medications   Medication Sig Start Date End Date Taking? Authorizing Provider  acetaminophen (TYLENOL) 325 MG tablet Take 2 tablets (650 mg total) by mouth every 4 (four) hours as needed for mild pain (or temp > 37.5 C (99.5 F)). 07/12/19   Angiulli, Lavon Paganini, PA-C  amLODipine (NORVASC) 5 MG tablet Take 1 tablet (5 mg total) by mouth daily. 07/26/19   Raulkar, Clide Deutscher, MD  cholecalciferol (VITAMIN D) 1000 units tablet Take 1 tablet (1,000 Units total) by mouth daily. 07/12/19   Angiulli, Lavon Paganini, PA-C  divalproex (DEPAKOTE) 500  MG DR tablet Take 1 tablet (500 mg total) by mouth every 12 (twelve) hours. 07/12/19   Angiulli, Lavon Paganini, PA-C  methocarbamol (ROBAXIN) 500 MG tablet Take 1 tablet (500 mg total) by mouth 4 (four) times daily. 08/13/19   Adryan Druckenmiller, Charline Bills, PA-C  metoprolol succinate (TOPROL-XL) 50 MG 24 hr tablet Take 1 tablet (50 mg total) by mouth daily. Take with or immediately following a meal. 07/12/19   Angiulli, Lavon Paganini, PA-C  Multiple Vitamin (MULTIVITAMIN) capsule Take 1 capsule by mouth daily.      [provider]  Ondansetron HCl  (ZOFRAN PO) Take by mouth. Unsure of dosage    [provider]  oxyCODONE-acetaminophen (PERCOCET/ROXICET) 5-325 MG tablet Take 1 tablet by mouth every 6 (six) hours as needed for severe pain. 08/13/19   Fredrick Geoghegan, Charline Bills, PA-C  predniSONE (DELTASONE) 50 MG tablet Take 1 tablet (50 mg total) by mouth daily with breakfast. 08/13/19   Jahred Tatar, Charline Bills, PA-C  scopolamine (TRANSDERM-SCOP, 1.5 MG,) 1 MG/3DAYS Place 1 patch (1.5 mg total) onto the skin every 3 (three) days. 07/26/19   Raulkar, Clide Deutscher, MD    Allergies Phenytoin  Family History  Problem Relation Age of Onset  . Hypertension Mother   . Heart disease Father   . Breast cancer Daughter 49  . Diabetes Maternal Aunt   . Coronary artery disease Paternal Aunt   . Breast cancer Paternal Aunt   . Coronary artery disease Paternal Uncle   . Heart disease Maternal Grandmother   . Heart disease Maternal Grandfather   . Heart disease Paternal Grandmother   . Heart disease Paternal Grandfather   . Colon cancer Neg Hx   . Stomach cancer Neg Hx   . Pancreatic cancer Neg Hx   . Rectal cancer Neg Hx     Social History Social History   Tobacco Use  . Smoking status: Never Smoker  . Smokeless tobacco: Never Used  Substance Use Topics  . Alcohol use: Yes    Alcohol/week: 0.0 standard drinks    Comment: occasional  . Drug use: No     Review of Systems  Constitutional: No fever/chills Eyes: No visual changes. No discharge ENT: No upper respiratory complaints. Cardiovascular: no chest pain. Respiratory: no cough. No SOB. Gastrointestinal: No abdominal pain.  No nausea, no vomiting.  No diarrhea.  No constipation. Musculoskeletal: Positive for lower back, sacral pain, bilateral hip pain Skin: Negative for rash, abrasions, lacerations, ecchymosis. Neurological: Negative for headaches, focal weakness or numbness. 10-point ROS otherwise negative.  ____________________________________________   PHYSICAL  EXAM:  VITAL SIGNS: ED Triage Vitals  Enc Vitals Group     BP --      Pulse Rate 08/13/19 1313 (!) 56     Resp 08/13/19 1313 18     Temp 08/13/19 1313 97.8 F (36.6 C)     Temp Source 08/13/19 1313 Oral     SpO2 08/13/19 1313 99 %     Weight 08/13/19 1314 120 lb (54.4 kg)     Height 08/13/19 1314 5\' 1"  (1.549 m)     Head Circumference --      Peak Flow --      Pain Score 08/13/19 1314 10     Pain Loc --      Pain Edu? --      Excl. in [place]? --      Constitutional: Alert and oriented. Well appearing and in no acute distress. Eyes: Conjunctivae are normal. PERRL. EOMI. Head: Atraumatic.  ENT:      Ears:       Nose: No congestion/rhinnorhea.      Mouth/Throat: Mucous membranes are moist.  Neck: No stridor.    Cardiovascular: Normal rate, regular rhythm. Normal S1 and S2.  Good peripheral circulation. Respiratory: Normal respiratory effort without tachypnea or retractions. Lungs CTAB. Good air entry to the bases with no decreased or absent breath sounds. Gastrointestinal: Bowel sounds 4 quadrants. Soft and nontender to palpation. No guarding or rigidity. No palpable masses. No distention. No CVA tenderness. Musculoskeletal: Full range of motion to all extremities. No gross deformities appreciated.  Visualization of the lumbar spine, pelvis, bilateral hips reveals no gross deformity.  No abrasions, lacerations, significant ecchymosis.  Patient is diffusely tender to palpation in the lower lumbar spine into the sacral spine.  No palpable abnormality or step-off.  Diffuse tenderness to palpation along the bilateral SI joints extending into the bilateral crest. Neurologic:  Normal speech and language. No gross focal neurologic deficits are appreciated.  Cranial nerves grossly intact. Skin:  Skin is warm, dry and intact. No rash noted. Psychiatric: Mood and affect are normal. Speech and behavior are normal. Patient exhibits appropriate insight and  judgement.   ____________________________________________   LABS (all labs ordered are listed, but only abnormal results are displayed)  Labs Reviewed - No data to display ____________________________________________  EKG   ____________________________________________  RADIOLOGY I personally viewed and evaluated these images as part of my medical decision making, as well as reviewing the written report by the radiologist.  DG Lumbar Spine 2-3 Views  Result Date: 08/13/2019 CLINICAL DATA:  Pt presents to the ED for tailbone pain. Pt had a recent mechanical fall around 1000 today where she landed on her buttocks. Denies head injury or LOC. Pt c/o 10/10 bottom pain. No bruising noted to the bottom at this time. EXAM: LUMBAR SPINE - 2-3 VIEW COMPARISON:  Chest radiograph, 06/15/2019 FINDINGS: There are compression fractures of T11, T12 and L1, with the T12 fracture present on the prior lateral chest radiograph, the L1 and L2 fractures possibly new. No other fractures.  No bone lesion.  No spondylolisthesis. Mild curvature of the lumbar spine, convex the left at L4 and to the right at L1-L2. Skeletal structures are diffusely demineralized. Moderate loss of disc height from L1-L2 through L3-L4. Atherosclerotic calcifications along a normal caliber aorta. IMPRESSION: 1. Chronic compression fracture of T11. 2. Compression fractures of T12 and L1 which may be recent, not evident on the prior lateral chest radiograph. No other evidence of an acute abnormality. Electronically Signed   By: Lajean Manes M.D.   On: 08/13/2019 14:31   DG Pelvis 1-2 Views  Result Date: 08/13/2019 CLINICAL DATA:  Pt presents to the ED for tailbone pain. Pt had a recent mechanical fall around 1000 today where she landed on her buttocks. Denies head injury or LOC. Pt c/o 10/10 bottom pain. No bruising noted to the bottom at this time. EXAM: PELVIS - 1-2 VIEW COMPARISON:  None. FINDINGS: No fracture or bone lesion. Hip joints,  SI joints and symphysis pubis are normally spaced and aligned. Skeletal structures are demineralized. Soft tissues are unremarkable. IMPRESSION: No fracture or acute finding. Electronically Signed   By: Lajean Manes M.D.   On: 08/13/2019 14:36   DG Sacrum/Coccyx  Result Date: 08/13/2019 CLINICAL DATA:  Pt presents to the ED for tailbone pain. Pt had a recent mechanical fall around 1000 today where she landed on her buttocks. Denies head injury or  LOC. Pt c/o 10/10 bottom pain. No bruising noted to the bottom at this time. EXAM: SACRUM AND COCCYX - 2+ VIEW COMPARISON:  None. FINDINGS: No fracture or bone lesion. SI joints normally spaced and aligned. Skeletal structures are demineralized. Soft tissues are unremarkable. IMPRESSION: No fracture or acute finding. Electronically Signed   By: Lajean Manes M.D.   On: 08/13/2019 14:35   DG Femur Min 2 Views Left  Result Date: 08/13/2019 CLINICAL DATA:  Pt presents to the ED for tailbone pain. Pt had a recent mechanical fall around 1000 today where she landed on her buttocks. Denies head injury or LOC. Pt c/o 10/10 bottom pain. No bruising noted to the bottom at this time. EXAM: LEFT FEMUR 2 VIEWS COMPARISON:  None. FINDINGS: No fracture or bone lesion. Hip joint normally spaced and aligned.  No arthropathic changes. Soft tissues are unremarkable. IMPRESSION: Negative. Electronically Signed   By: Lajean Manes M.D.   On: 08/13/2019 14:38   DG Femur Min 2 Views Right  Result Date: 08/13/2019 CLINICAL DATA:  Pt presents to the ED for tailbone pain. Pt had a recent mechanical fall around 1000 today where she landed on her buttocks. Denies head injury or LOC. Pt c/o 10/10 bottom pain. No bruising noted to the bottom at this time. EXAM: RIGHT FEMUR 2 VIEWS COMPARISON:  None. FINDINGS: No fracture or bone lesion. Hip joint normally spaced and aligned. Small area of subchondral cystic change along the superolateral right acetabulum. No other degenerative change. Soft  tissues are unremarkable. IMPRESSION: No fracture or acute finding. Electronically Signed   By: Lajean Manes M.D.   On: 08/13/2019 14:37    ____________________________________________    PROCEDURES  Procedure(s) performed:    Procedures    Medications  oxyCODONE-acetaminophen (PERCOCET/ROXICET) 5-325 MG per tablet 1 tablet (1 tablet Oral Given 08/13/19 1352)  ondansetron (ZOFRAN-ODT) disintegrating tablet 8 mg (8 mg Oral Given 08/13/19 1352)     ____________________________________________   INITIAL IMPRESSION / ASSESSMENT AND PLAN / ED COURSE  Pertinent labs & imaging results that were available during my care of the patient were reviewed by me and considered in my medical decision making (see chart for details).  Review of the [place] CSRS was performed in accordance of the [place] prior to dispensing any controlled drugs.           Patient's diagnosis is consistent with fall, coccygeal contusion.  Patient presented to emergency department complaining of pain and the coccyx/sacrum region.  Patient recently sustained a stroke, is completing physical therapy and Occupational Therapy.  She states that she had a purely mechanical fall when she was on her walker, turned too sharply, lost her balance and could not correct with her walker.  Patient fell sideways but landed on her buttocks squarely.  She did not hit her head or lose consciousness.  Patient had no other complaints other than musculoskeletal pain.  Exam was overall reassuring.  No concerning neurovascular deficits in the lower extremity.  Imaging reveals no acute fractures.  Patient will be prescribed medications for symptomatic control.  Follow-up with primary care as needed..  Patient is given ED precautions to return to the ED for any worsening or new symptoms.     ____________________________________________  FINAL CLINICAL IMPRESSION(S) / ED DIAGNOSES  Final diagnoses:  Fall, initial encounter  Contusion of coccyx,  initial encounter      NEW MEDICATIONS STARTED DURING THIS VISIT:  ED Discharge Orders  Ordered    oxyCODONE-acetaminophen (PERCOCET/ROXICET) 5-325 MG tablet  Every 6 hours PRN     08/13/19 1655    predniSONE (DELTASONE) 50 MG tablet  Daily with breakfast     08/13/19 1655    methocarbamol (ROBAXIN) 500 MG tablet  4 times daily     08/13/19 1655              This chart was dictated using voice recognition software/Dragon. Despite best efforts to proofread, errors can occur which can change the meaning. Any change was purely unintentional.    Darletta Moll, PA-C 08/13/19 1656    Nance Pear, MD 08/13/19 [phone]

## 2019-08-13 NOTE — ED Notes (Addendum)
Pt presents to the ED for tailbone pain. Pt had a recent mechanical fall around 1000 today where she landed on her buttocks. Denies head injury or LOC. Pt c/o 10/10 bottom pain. No bruising noted to the bottom at this time.

## 2019-08-13 NOTE — ED Notes (Addendum)
First Nurse Note: Pt to ED via POV s/p fall this morning. Pt is c/o pain. Pt had CVA 2 months ago.   Pt did not hit head. Pt does take Aspirin 81 mg. Pt is able to answer questions appropriately at this time.

## 2019-08-16 ENCOUNTER — Telehealth: Payer: Self-pay | Admitting: *Deleted

## 2019-08-16 NOTE — Telephone Encounter (Signed)
Anna Genre Electrical engineer with [facility] left a voicemail requesting to move the discharge date for the patient. Anna Genre stated that she would like to move the date from this week to [id number] due to conflict of schedule for both parties. Anna Genre stated that it is okay to leave the order on her voicemail.

## 2019-08-17 DIAGNOSIS — H8190 Unspecified disorder of vestibular function, unspecified ear: Secondary | ICD-10-CM | POA: Diagnosis not present

## 2019-08-17 DIAGNOSIS — M810 Age-related osteoporosis without current pathological fracture: Secondary | ICD-10-CM | POA: Diagnosis not present

## 2019-08-17 DIAGNOSIS — I10 Essential (primary) hypertension: Secondary | ICD-10-CM | POA: Diagnosis not present

## 2019-08-17 DIAGNOSIS — I69391 Dysphagia following cerebral infarction: Secondary | ICD-10-CM | POA: Diagnosis not present

## 2019-08-17 DIAGNOSIS — G40909 Epilepsy, unspecified, not intractable, without status epilepticus: Secondary | ICD-10-CM | POA: Diagnosis not present

## 2019-08-17 DIAGNOSIS — K219 Gastro-esophageal reflux disease without esophagitis: Secondary | ICD-10-CM | POA: Diagnosis not present

## 2019-08-17 DIAGNOSIS — I69298 Other sequelae of other nontraumatic intracranial hemorrhage: Secondary | ICD-10-CM | POA: Diagnosis not present

## 2019-08-17 DIAGNOSIS — R1310 Dysphagia, unspecified: Secondary | ICD-10-CM | POA: Diagnosis not present

## 2019-08-17 DIAGNOSIS — I69293 Ataxia following other nontraumatic intracranial hemorrhage: Secondary | ICD-10-CM | POA: Diagnosis not present

## 2019-08-17 NOTE — Telephone Encounter (Signed)
Left orders on verified VM.

## 2019-08-17 NOTE — Telephone Encounter (Signed)
That is okay.

## 2019-08-18 ENCOUNTER — Inpatient Hospital Stay: Payer: Self-pay | Admitting: Adult Health

## 2019-08-18 ENCOUNTER — Other Ambulatory Visit: Payer: Self-pay | Admitting: Physical Medicine and Rehabilitation

## 2019-08-22 DIAGNOSIS — R1310 Dysphagia, unspecified: Secondary | ICD-10-CM | POA: Diagnosis not present

## 2019-08-22 DIAGNOSIS — I69391 Dysphagia following cerebral infarction: Secondary | ICD-10-CM | POA: Diagnosis not present

## 2019-08-22 DIAGNOSIS — I10 Essential (primary) hypertension: Secondary | ICD-10-CM | POA: Diagnosis not present

## 2019-08-22 DIAGNOSIS — M810 Age-related osteoporosis without current pathological fracture: Secondary | ICD-10-CM | POA: Diagnosis not present

## 2019-08-22 DIAGNOSIS — I69293 Ataxia following other nontraumatic intracranial hemorrhage: Secondary | ICD-10-CM | POA: Diagnosis not present

## 2019-08-22 DIAGNOSIS — K219 Gastro-esophageal reflux disease without esophagitis: Secondary | ICD-10-CM | POA: Diagnosis not present

## 2019-08-22 DIAGNOSIS — I69298 Other sequelae of other nontraumatic intracranial hemorrhage: Secondary | ICD-10-CM | POA: Diagnosis not present

## 2019-08-22 DIAGNOSIS — G40909 Epilepsy, unspecified, not intractable, without status epilepticus: Secondary | ICD-10-CM | POA: Diagnosis not present

## 2019-08-22 DIAGNOSIS — H8190 Unspecified disorder of vestibular function, unspecified ear: Secondary | ICD-10-CM | POA: Diagnosis not present

## 2019-08-24 ENCOUNTER — Telehealth: Payer: Self-pay

## 2019-08-24 DIAGNOSIS — H8190 Unspecified disorder of vestibular function, unspecified ear: Secondary | ICD-10-CM | POA: Diagnosis not present

## 2019-08-24 DIAGNOSIS — K219 Gastro-esophageal reflux disease without esophagitis: Secondary | ICD-10-CM | POA: Diagnosis not present

## 2019-08-24 DIAGNOSIS — I69391 Dysphagia following cerebral infarction: Secondary | ICD-10-CM | POA: Diagnosis not present

## 2019-08-24 DIAGNOSIS — M810 Age-related osteoporosis without current pathological fracture: Secondary | ICD-10-CM | POA: Diagnosis not present

## 2019-08-24 DIAGNOSIS — I69298 Other sequelae of other nontraumatic intracranial hemorrhage: Secondary | ICD-10-CM | POA: Diagnosis not present

## 2019-08-24 DIAGNOSIS — G40909 Epilepsy, unspecified, not intractable, without status epilepticus: Secondary | ICD-10-CM | POA: Diagnosis not present

## 2019-08-24 DIAGNOSIS — R1310 Dysphagia, unspecified: Secondary | ICD-10-CM | POA: Diagnosis not present

## 2019-08-24 DIAGNOSIS — I69293 Ataxia following other nontraumatic intracranial hemorrhage: Secondary | ICD-10-CM | POA: Diagnosis not present

## 2019-08-24 DIAGNOSIS — I10 Essential (primary) hypertension: Secondary | ICD-10-CM | POA: Diagnosis not present

## 2019-08-24 NOTE — Telephone Encounter (Signed)
Still very sore from her fall 08-13-19. She stopped the oxycodone after a few days due to constipation. She is doing ok. Still hard to sit for a long period of time.

## 2019-08-25 ENCOUNTER — Inpatient Hospital Stay: Payer: Self-pay | Admitting: Adult Health

## 2019-08-29 DIAGNOSIS — R1310 Dysphagia, unspecified: Secondary | ICD-10-CM | POA: Diagnosis not present

## 2019-08-29 DIAGNOSIS — H8190 Unspecified disorder of vestibular function, unspecified ear: Secondary | ICD-10-CM | POA: Diagnosis not present

## 2019-08-29 DIAGNOSIS — I69293 Ataxia following other nontraumatic intracranial hemorrhage: Secondary | ICD-10-CM | POA: Diagnosis not present

## 2019-08-29 DIAGNOSIS — I69298 Other sequelae of other nontraumatic intracranial hemorrhage: Secondary | ICD-10-CM | POA: Diagnosis not present

## 2019-08-29 DIAGNOSIS — M810 Age-related osteoporosis without current pathological fracture: Secondary | ICD-10-CM | POA: Diagnosis not present

## 2019-08-29 DIAGNOSIS — I10 Essential (primary) hypertension: Secondary | ICD-10-CM | POA: Diagnosis not present

## 2019-08-29 DIAGNOSIS — G40909 Epilepsy, unspecified, not intractable, without status epilepticus: Secondary | ICD-10-CM | POA: Diagnosis not present

## 2019-08-29 DIAGNOSIS — K219 Gastro-esophageal reflux disease without esophagitis: Secondary | ICD-10-CM | POA: Diagnosis not present

## 2019-08-29 DIAGNOSIS — I69391 Dysphagia following cerebral infarction: Secondary | ICD-10-CM | POA: Diagnosis not present

## 2019-08-30 ENCOUNTER — Encounter: Payer: Medicare PPO | Admitting: Physical Medicine and Rehabilitation

## 2019-09-08 DIAGNOSIS — G40909 Epilepsy, unspecified, not intractable, without status epilepticus: Secondary | ICD-10-CM | POA: Diagnosis not present

## 2019-09-08 DIAGNOSIS — R1310 Dysphagia, unspecified: Secondary | ICD-10-CM | POA: Diagnosis not present

## 2019-09-08 DIAGNOSIS — K219 Gastro-esophageal reflux disease without esophagitis: Secondary | ICD-10-CM | POA: Diagnosis not present

## 2019-09-08 DIAGNOSIS — I10 Essential (primary) hypertension: Secondary | ICD-10-CM | POA: Diagnosis not present

## 2019-09-08 DIAGNOSIS — M810 Age-related osteoporosis without current pathological fracture: Secondary | ICD-10-CM | POA: Diagnosis not present

## 2019-09-08 DIAGNOSIS — I69391 Dysphagia following cerebral infarction: Secondary | ICD-10-CM | POA: Diagnosis not present

## 2019-09-08 DIAGNOSIS — H8190 Unspecified disorder of vestibular function, unspecified ear: Secondary | ICD-10-CM | POA: Diagnosis not present

## 2019-09-08 DIAGNOSIS — I69298 Other sequelae of other nontraumatic intracranial hemorrhage: Secondary | ICD-10-CM | POA: Diagnosis not present

## 2019-09-08 DIAGNOSIS — I69293 Ataxia following other nontraumatic intracranial hemorrhage: Secondary | ICD-10-CM | POA: Diagnosis not present

## 2019-09-09 ENCOUNTER — Telehealth: Payer: Self-pay

## 2019-09-09 NOTE — Telephone Encounter (Signed)
Tanya Knight OT [facility] request verbal orders for [facility] OT 1 x a wk for 5 wks.

## 2019-09-10 NOTE — Telephone Encounter (Signed)
Okay 

## 2019-09-12 DIAGNOSIS — H8190 Unspecified disorder of vestibular function, unspecified ear: Secondary | ICD-10-CM | POA: Diagnosis not present

## 2019-09-12 DIAGNOSIS — K219 Gastro-esophageal reflux disease without esophagitis: Secondary | ICD-10-CM | POA: Diagnosis not present

## 2019-09-12 DIAGNOSIS — R1310 Dysphagia, unspecified: Secondary | ICD-10-CM | POA: Diagnosis not present

## 2019-09-12 DIAGNOSIS — I69293 Ataxia following other nontraumatic intracranial hemorrhage: Secondary | ICD-10-CM | POA: Diagnosis not present

## 2019-09-12 DIAGNOSIS — I619 Nontraumatic intracerebral hemorrhage, unspecified: Secondary | ICD-10-CM | POA: Diagnosis not present

## 2019-09-12 DIAGNOSIS — G40909 Epilepsy, unspecified, not intractable, without status epilepticus: Secondary | ICD-10-CM | POA: Diagnosis not present

## 2019-09-12 DIAGNOSIS — I69298 Other sequelae of other nontraumatic intracranial hemorrhage: Secondary | ICD-10-CM | POA: Diagnosis not present

## 2019-09-12 DIAGNOSIS — I69391 Dysphagia following cerebral infarction: Secondary | ICD-10-CM | POA: Diagnosis not present

## 2019-09-12 DIAGNOSIS — I10 Essential (primary) hypertension: Secondary | ICD-10-CM | POA: Diagnosis not present

## 2019-09-12 DIAGNOSIS — M810 Age-related osteoporosis without current pathological fracture: Secondary | ICD-10-CM | POA: Diagnosis not present

## 2019-09-12 NOTE — Telephone Encounter (Signed)
Verbal order given to [facility]

## 2019-09-14 ENCOUNTER — Other Ambulatory Visit: Payer: Self-pay

## 2019-09-14 ENCOUNTER — Ambulatory Visit: Payer: Medicare PPO | Admitting: Adult Health

## 2019-09-14 ENCOUNTER — Encounter: Payer: Self-pay | Admitting: Adult Health

## 2019-09-14 VITALS — BP 132/90 | HR 80 | Temp 98.1°F | Ht 61.0 in | Wt 114.0 lb

## 2019-09-14 DIAGNOSIS — E782 Mixed hyperlipidemia: Secondary | ICD-10-CM

## 2019-09-14 DIAGNOSIS — I10 Essential (primary) hypertension: Secondary | ICD-10-CM

## 2019-09-14 DIAGNOSIS — I614 Nontraumatic intracerebral hemorrhage in cerebellum: Secondary | ICD-10-CM

## 2019-09-14 MED ORDER — AMLODIPINE BESYLATE 2.5 MG PO TABS: 7.5000 mg | ORAL_TABLET | Freq: Every day | ORAL | 2 refills | Status: DC

## 2019-09-14 NOTE — Patient Instructions (Signed)
Continue to work with therapies at home and if interested in [place] outpatient therapies once completed, please call office  Continue to follow up with PCP regarding cholesterol and blood pressure management - repeat cholesterol levels at follow up visit with PCP and if LDL > 70, would recommend starting low dose statin  Increase amlodipine to 7.5mg  daily - continue to monitor blood pressure and follow up with PCP in 1 month for ongoing monitoring and management  Maintain strict control of hypertension with blood pressure goal below 130/90, diabetes with hemoglobin A1c goal below 6.5% and cholesterol with LDL cholesterol (bad cholesterol) goal below 70 mg/dL. I also advised the patient to eat a healthy diet with plenty of whole grains, cereals, fruits and vegetables, exercise regularly and maintain ideal body weight.  Followup in the future with me in 3 months or call earlier if needed       Thank you for coming to see [place] at [facility]. I hope we have been able to provide you high quality care today.  You may receive a patient satisfaction survey over the next few weeks. We would appreciate your feedback and comments so that we may continue to improve ourselves and the health of our patients.

## 2019-09-14 NOTE — Progress Notes (Signed)
[facility] [address] [place]. [place] [address] [phone]       HOSPITAL FOLLOW UP NOTE  Tanya Knight Date of Birth:  April 23, 1941 Medical Record Number:  [id number]   Reason for Referral:  hospital stroke follow up    SUBJECTIVE:   CHIEF COMPLAINT:  Chief Complaint  Patient presents with  . Cerebrovascular Accident    [place], son- Pilar Plate "getting PT/OT; falling- I go to the left"    HPI:   TanyaAfua JANEI LILLA a 79 y.o.femalewith history of HTN and seizures who presented on 06/10/2019 with sudden onset HA, nausea, vomiting, slurred speech and weakness resulting in a fall.   Stroke work-up revealed cerebellar vermis ICH with SDH and SAH likely hypertensive.  Previously on aspirin which was discontinued given hemorrhage. Hx of HTN stable during admission and discharged on amlodipine 10 mg daily along with continuation of home meds metoprolol 50 mg daily.  LDL 120 and held statin during admission given hemorrhage and recommended consideration of statin at follow-up.  History of one-time seizure 1997 on Depakote 500 mg twice daily.  Other stroke risk factors include advanced age and EtOH use but no prior history of stroke.  Evaluated by therapies who recommended discharge to CIR.  ICH: Cerebellar vermis ICH with SDH and SAH, likely hypertensive  Code Stroke CT head 16cc hematoma cerebellar vermis w/ mild SDH and SAH.4th ventricle narrowing w/o hydrocephalus  CTA head no AVM or aneurysm seen. No LVO or stenosis. 20mm supraclinoid R ICA aneurysm.   MRI -stable hematoma, SDH, SAH, trace IVH, no hydrocephalus, no obvious CAA  CT head 1/31 - stable hematoma, no hydro  2D Echo - EF 55 to 60%. No cardiac source of emboli identified.   LDL- 120  HgbA1c- 5.1  UDS neg  Heparin subq for VTE prophylaxis  aspirin 81 mg dailyprior to admission, now on No antithromboticgiven hemorrhage   Therapy recommendations: CIR  Disposition:  CIR (lives w/ husband, son supportive)  Today, 09/14/2019, Tanya Knight is being seen for hospital follow-up accompanied by her son. Residual deficits of dizziness, nausea, imbalance and gait instability but does endorse improvement.  She continues to experience occasional headaches approx 1 x every 4 days lasting a 2 hours and will spontaneously resolve without intervention.  Ambulates with rolling walker.  She continues to participate in home health therapies. She did have a fall leading to ED evaluation on 08/13/2019 without injury except bruising. She also reports having a fall today prior to visit. Unsure if she hit her head but denies headache or any other new symptoms.  Denies any other injury.  She does have scheduled neuro-ophthalmology appointment for next month as she continues to have difficulty with tracking, focusing and reading but states improvement.  Blood pressure today 132/90 but monitors at home and typical range 140-160/70-80.  Blood pressure adjusted approximately 1 month ago due to low blood pressures amlodipine 10 mg daily and has been experiencing gradually increase blood pressures since decrease to 5 mg daily.  No further concerns at this time.    ROS:   14 system review of systems performed and negative with exception of imbalance, dizziness, nausea and headache  PMH:  Past Medical History:  Diagnosis Date  . Fracture of metatarsal    Repair of fractured R metatarsal --Dr Duda---4/08  . GERD (gastroesophageal reflux disease)   . Hypertension   . Melanoma in situ [facility]) 7/17   Dr Kellie Moor  . Osteoporosis   .  Seizure disorder ([place])   . Seizures ([place])   . Stroke ([place]) 06/2019  . Vitamin D deficiency     PSH:  Past Surgical History:  Procedure Laterality Date  . CATARACT EXTRACTION, BILATERAL Bilateral 2014  . EYE SURGERY     Obstructed tear duct  . FOOT SURGERY  2008  . MELANOMA EXCISION Left 11/2015   left lower leg  . TEAR DUCT PROBING  10/12   temporary stent    . TONSILLECTOMY AND ADENOIDECTOMY  1948    Social History:  Social History   Socioeconomic History  . Marital status: Married    Spouse name: Not on file  . Number of children: 3  . Years of education: 47  . Highest education level: Not on file  Occupational History  . Occupation: retired Financial planner: retired  Tobacco Use  . Smoking status: Never Smoker  . Smokeless tobacco: Never Used  Substance and Sexual Activity  . Alcohol use: Yes    Alcohol/week: 0.0 standard drinks    Comment: occasional  . Drug use: No  . Sexual activity: Yes    Birth control/protection: Post-menopausal  Other Topics Concern  . Not on file  Social History Narrative   09/14/19 lives at home with husband   Regular exercise-yes---aerobics, Pilates, jogged in past (now walks)      Has living will   Husband, then one of her children, has health care POA   Would accept resuscitation but no prolonged artificial ventilation   Probably would not want tube feeds if cognitively unaware   Social Determinants of Health   Financial Resource Strain:   . Difficulty of Paying Living Expenses:   Food Insecurity:   . Worried About Charity fundraiser in the Last Year:   . Arboriculturist in the Last Year:   Transportation Needs:   . Film/video editor (Medical):   Marland Kitchen Lack of Transportation (Non-Medical):   Physical Activity:   . Days of Exercise per Week:   . Minutes of Exercise per Session:   Stress:   . Feeling of Stress :   Social Connections:   . Frequency of Communication with Friends and Family:   . Frequency of Social Gatherings with Friends and Family:   . Attends Religious Services:   . Active Member of Clubs or Organizations:   . Attends Archivist Meetings:   Marland Kitchen Marital Status:   Intimate Partner Violence:   . Fear of Current or Ex-Partner:   . Emotionally Abused:   Marland Kitchen Physically Abused:   . Sexually Abused:     Family History:  Family History  Problem  Relation Age of Onset  . Hypertension Mother   . Heart disease Father   . Breast cancer Daughter 37  . Diabetes Maternal Aunt   . Coronary artery disease Paternal Aunt   . Breast cancer Paternal Aunt   . Coronary artery disease Paternal Uncle   . Heart disease Maternal Grandmother   . Heart disease Maternal Grandfather   . Heart disease Paternal Grandmother   . Heart disease Paternal Grandfather   . Colon cancer Neg Hx   . Stomach cancer Neg Hx   . Pancreatic cancer Neg Hx   . Rectal cancer Neg Hx     Medications:   Current Outpatient Medications on File Prior to Visit  Medication Sig Dispense Refill  . cholecalciferol (VITAMIN D) 1000 units tablet Take 1 tablet (1,000 Units total) by mouth  daily. 30 tablet 0  . divalproex (DEPAKOTE) 500 MG DR tablet Take 1 tablet (500 mg total) by mouth every 12 (twelve) hours. 60 tablet 0  . methocarbamol (ROBAXIN) 500 MG tablet Take 1 tablet (500 mg total) by mouth 4 (four) times daily. 16 tablet 0  . metoprolol succinate (TOPROL-XL) 50 MG 24 hr tablet Take 1 tablet (50 mg total) by mouth daily. Take with or immediately following a meal. 30 tablet 0  . Multiple Vitamin (MULTIVITAMIN) capsule Take 1 capsule by mouth daily.      Marland Kitchen acetaminophen (TYLENOL) 325 MG tablet Take 2 tablets (650 mg total) by mouth every 4 (four) hours as needed for mild pain (or temp > 37.5 C (99.5 F)). (Patient not taking: Reported on 09/14/2019)    . Ondansetron HCl (ZOFRAN PO) Take by mouth. Unsure of dosage    . oxyCODONE-acetaminophen (PERCOCET/ROXICET) 5-325 MG tablet Take 1 tablet by mouth every 6 (six) hours as needed for severe pain. (Patient not taking: Reported on 09/14/2019) 20 tablet 0  . predniSONE (DELTASONE) 50 MG tablet Take 1 tablet (50 mg total) by mouth daily with breakfast. (Patient not taking: Reported on 09/14/2019) 5 tablet 0  . scopolamine (TRANSDERM-SCOP, 1.5 MG,) 1 MG/3DAYS Place 1 patch (1.5 mg total) onto the skin every 3 (three) days. (Patient not  taking: Reported on 09/14/2019) 10 patch 12   No current facility-administered medications on file prior to visit.    Allergies:   Allergies  Allergen Reactions  . Phenytoin     Other reaction(s): Other (See Comments) Other Reaction: Other reaction REACTION: severe reaction      OBJECTIVE:  Physical Exam  Vitals:   09/14/19 1502  BP: 132/90  Pulse: 80  Temp: 98.1 F (36.7 C)  Weight: 114 lb (51.7 kg)  Height: 5\' 1"  (1.549 m)   Body mass index is 21.54 kg/m. No exam data present  Depression screen [facility] 2/9 09/14/2019  Decreased Interest 0  Down, Depressed, Hopeless 0  PHQ - 2 Score 0     General: Frail very pleasant elderly Caucasian female, seated, in no evident distress Head: head normocephalic and atraumatic.   Neck: supple with no carotid or supraclavicular bruits Cardiovascular: regular rate and rhythm, no murmurs Musculoskeletal: no deformity Skin:  no rash/petichiae Vascular:  Normal pulses all extremities   Neurologic Exam Mental Status: Awake and fully alert.   Fluent speech and language.  Oriented to place and time. Recent and remote memory intact. Attention span, concentration and fund of knowledge appropriate. Mood and affect appropriate.  Cranial Nerves: Fundoscopic exam reveals sharp disc margins. Pupils equal, briskly reactive to light. Extraocular movements full without nystagmus. Visual fields full to confrontation. Hearing intact. Facial sensation intact. Face, tongue, palate moves normally and symmetrically.  Motor: Normal bulk and tone. Normal strength in all tested extremity muscles. Sensory.: intact to touch , pinprick , position and vibratory sensation.  Coordination: Rapid alternating movements normal in all extremities. Finger-to-nose and heel-to-shin performed showing mild dysmetria on left. Gait and Station: Deferred as rolling walker not present during visit Reflexes: 1+ and symmetric. Toes downgoing.     NIHSS  2 Modified Rankin   3-4     ASSESSMENT: Tanya Knight is a 79 y.o. year old female presented with sudden onset headache, nausea, vomiting, slurred speech and weakness resulting in fall on 06/10/2019 with stroke work-up revealing cerebellar vermis ICH with SDH and SAH likely hypertensive.  Vascular risk factors include HTN, HLD, seizure history, advanced age  and EtOH use.  Residual deficits of central vestibular dysfunction with dizziness, nausea, headache, gait impairment and mild left-sided dysmetria     PLAN:  1. Cerebellar ICH: Ongoing participation in home health therapies and possibly transition to outpatient therapies once completed.  No indication to restart aspirin 81 mg daily as no prior ischemic stroke history, CAD history or stent history.  Maintain strict control of hypertension with blood pressure goal below 130/90, diabetes with hemoglobin A1c goal below 6.5% and cholesterol with LDL cholesterol (bad cholesterol) goal below 70 mg/dL.  I also advised the patient to eat a healthy diet with plenty of whole grains, cereals, fruits and vegetables, exercise regularly with at least 30 minutes of continuous activity daily and maintain ideal body weight. 2. HTN: Adjust amlodipine dosage from 5 mg to 7.5 mg daily due to high blood pressure readings at home.  Advised to continue to monitor at home and to follow-up with PCP in 1 month for reevaluation 3. HLD: LDL during recent admission 120.  Plans on obtaining lab work at follow-up visit with PCP and recommend if LDL greater than 70 to consider initiating low-dose statin    Follow up in 3 months or call earlier if needed   I spent 45 minutes of face-to-face and non-face-to-face time with patient and son.  This included previsit chart review, lab review, study review, order entry, electronic health record documentation, patient education regarding recent stroke, residual deficits, importance of managing stroke risk factors and answered all questions to patient  satisfaction     Frann Rider, [facility]  [facility] [address] [place], [place] [address]  Phone [phone] Fax [phone] Note: This document was prepared with digital dictation and possible smart phrase technology. Any transcriptional errors that result from this process are unintentional.

## 2019-09-15 DIAGNOSIS — I69298 Other sequelae of other nontraumatic intracranial hemorrhage: Secondary | ICD-10-CM | POA: Diagnosis not present

## 2019-09-15 DIAGNOSIS — I69293 Ataxia following other nontraumatic intracranial hemorrhage: Secondary | ICD-10-CM | POA: Diagnosis not present

## 2019-09-15 DIAGNOSIS — I10 Essential (primary) hypertension: Secondary | ICD-10-CM | POA: Diagnosis not present

## 2019-09-15 DIAGNOSIS — R1310 Dysphagia, unspecified: Secondary | ICD-10-CM | POA: Diagnosis not present

## 2019-09-15 DIAGNOSIS — M810 Age-related osteoporosis without current pathological fracture: Secondary | ICD-10-CM | POA: Diagnosis not present

## 2019-09-15 DIAGNOSIS — K219 Gastro-esophageal reflux disease without esophagitis: Secondary | ICD-10-CM | POA: Diagnosis not present

## 2019-09-15 DIAGNOSIS — H8190 Unspecified disorder of vestibular function, unspecified ear: Secondary | ICD-10-CM | POA: Diagnosis not present

## 2019-09-15 DIAGNOSIS — I69391 Dysphagia following cerebral infarction: Secondary | ICD-10-CM | POA: Diagnosis not present

## 2019-09-15 DIAGNOSIS — G40909 Epilepsy, unspecified, not intractable, without status epilepticus: Secondary | ICD-10-CM | POA: Diagnosis not present

## 2019-09-16 NOTE — Progress Notes (Signed)
I agree with the above plan 

## 2019-09-21 DIAGNOSIS — K219 Gastro-esophageal reflux disease without esophagitis: Secondary | ICD-10-CM | POA: Diagnosis not present

## 2019-09-21 DIAGNOSIS — G40909 Epilepsy, unspecified, not intractable, without status epilepticus: Secondary | ICD-10-CM | POA: Diagnosis not present

## 2019-09-21 DIAGNOSIS — H8190 Unspecified disorder of vestibular function, unspecified ear: Secondary | ICD-10-CM | POA: Diagnosis not present

## 2019-09-21 DIAGNOSIS — I10 Essential (primary) hypertension: Secondary | ICD-10-CM | POA: Diagnosis not present

## 2019-09-21 DIAGNOSIS — M810 Age-related osteoporosis without current pathological fracture: Secondary | ICD-10-CM | POA: Diagnosis not present

## 2019-09-21 DIAGNOSIS — R1310 Dysphagia, unspecified: Secondary | ICD-10-CM | POA: Diagnosis not present

## 2019-09-21 DIAGNOSIS — I69391 Dysphagia following cerebral infarction: Secondary | ICD-10-CM | POA: Diagnosis not present

## 2019-09-21 DIAGNOSIS — I69298 Other sequelae of other nontraumatic intracranial hemorrhage: Secondary | ICD-10-CM | POA: Diagnosis not present

## 2019-09-21 DIAGNOSIS — I69293 Ataxia following other nontraumatic intracranial hemorrhage: Secondary | ICD-10-CM | POA: Diagnosis not present

## 2019-09-22 ENCOUNTER — Ambulatory Visit: Payer: Medicare PPO | Admitting: Physical Medicine and Rehabilitation

## 2019-09-22 DIAGNOSIS — G40909 Epilepsy, unspecified, not intractable, without status epilepticus: Secondary | ICD-10-CM | POA: Diagnosis not present

## 2019-09-22 DIAGNOSIS — I10 Essential (primary) hypertension: Secondary | ICD-10-CM | POA: Diagnosis not present

## 2019-09-22 DIAGNOSIS — R1310 Dysphagia, unspecified: Secondary | ICD-10-CM | POA: Diagnosis not present

## 2019-09-22 DIAGNOSIS — M810 Age-related osteoporosis without current pathological fracture: Secondary | ICD-10-CM | POA: Diagnosis not present

## 2019-09-22 DIAGNOSIS — H8190 Unspecified disorder of vestibular function, unspecified ear: Secondary | ICD-10-CM | POA: Diagnosis not present

## 2019-09-22 DIAGNOSIS — K219 Gastro-esophageal reflux disease without esophagitis: Secondary | ICD-10-CM | POA: Diagnosis not present

## 2019-09-22 DIAGNOSIS — I69391 Dysphagia following cerebral infarction: Secondary | ICD-10-CM | POA: Diagnosis not present

## 2019-09-22 DIAGNOSIS — I69293 Ataxia following other nontraumatic intracranial hemorrhage: Secondary | ICD-10-CM | POA: Diagnosis not present

## 2019-09-22 DIAGNOSIS — I69298 Other sequelae of other nontraumatic intracranial hemorrhage: Secondary | ICD-10-CM | POA: Diagnosis not present

## 2019-09-26 DIAGNOSIS — I69293 Ataxia following other nontraumatic intracranial hemorrhage: Secondary | ICD-10-CM | POA: Diagnosis not present

## 2019-09-26 DIAGNOSIS — H8190 Unspecified disorder of vestibular function, unspecified ear: Secondary | ICD-10-CM | POA: Diagnosis not present

## 2019-09-26 DIAGNOSIS — I69391 Dysphagia following cerebral infarction: Secondary | ICD-10-CM | POA: Diagnosis not present

## 2019-09-26 DIAGNOSIS — R1310 Dysphagia, unspecified: Secondary | ICD-10-CM | POA: Diagnosis not present

## 2019-09-26 DIAGNOSIS — I69298 Other sequelae of other nontraumatic intracranial hemorrhage: Secondary | ICD-10-CM | POA: Diagnosis not present

## 2019-09-26 DIAGNOSIS — M810 Age-related osteoporosis without current pathological fracture: Secondary | ICD-10-CM | POA: Diagnosis not present

## 2019-09-26 DIAGNOSIS — K219 Gastro-esophageal reflux disease without esophagitis: Secondary | ICD-10-CM | POA: Diagnosis not present

## 2019-09-26 DIAGNOSIS — I10 Essential (primary) hypertension: Secondary | ICD-10-CM | POA: Diagnosis not present

## 2019-09-26 DIAGNOSIS — G40909 Epilepsy, unspecified, not intractable, without status epilepticus: Secondary | ICD-10-CM | POA: Diagnosis not present

## 2019-09-29 ENCOUNTER — Other Ambulatory Visit: Payer: Self-pay

## 2019-09-29 ENCOUNTER — Encounter: Payer: Medicare PPO | Attending: Physical Medicine and Rehabilitation | Admitting: Physical Medicine and Rehabilitation

## 2019-09-29 ENCOUNTER — Encounter: Payer: Self-pay | Admitting: Physical Medicine and Rehabilitation

## 2019-09-29 VITALS — BP 144/91 | HR 70 | Temp 97.7°F | Ht 61.0 in | Wt 114.6 lb

## 2019-09-29 DIAGNOSIS — I69391 Dysphagia following cerebral infarction: Secondary | ICD-10-CM | POA: Insufficient documentation

## 2019-09-29 DIAGNOSIS — M545 Low back pain, unspecified: Secondary | ICD-10-CM

## 2019-09-29 DIAGNOSIS — I10 Essential (primary) hypertension: Secondary | ICD-10-CM | POA: Insufficient documentation

## 2019-09-29 DIAGNOSIS — I619 Nontraumatic intracerebral hemorrhage, unspecified: Secondary | ICD-10-CM

## 2019-09-29 DIAGNOSIS — R112 Nausea with vomiting, unspecified: Secondary | ICD-10-CM | POA: Diagnosis not present

## 2019-09-29 NOTE — Progress Notes (Signed)
Subjective:    Patient ID: Tanya Knight, female    DOB: [birth year], 79 y.o.   MRN: [id number]  HPI  Tanya Knight is a 79 year old woman who presents for follow-up of intracerebral hemorrhage.  She has been doing well at home. She is receiving home PT and OT and has been working on strengthening, ADLs, and ambulation. She has been ambulating around her home with her husband's assistance. She gets out of her wheelchair 4-5 times per day to use the bathroom and this is when she usually ambulates. She has not ambulated outside except for her doctors' visits. She did have one fall since last visit. She suffered no fractures but took nearly a month to recover from this.   Her nausea and dizziness have mostly resolved and she is not requiring any medications.   She no longer has headache. Denies pain, constipation. Sleeping well at night.  She brings in a blood pressure log and pressures have been relatively well controlled. Neurology increased her Amlodipine to 7.5mg .   Pain Inventory Average Pain 3 Pain Right Now 3 My pain is aching  In the last 24 hours, has pain interfered with the following? General activity 4 Relation with others 4 Enjoyment of life 4 What TIME of day is your pain at its worst? varies Sleep (in general) Good  Pain is worse with: standing Pain improves with: rest Relief from Meds: na  Mobility walk with assistance use a walker how many minutes can you walk? 5 ability to climb steps?  yes do you drive?  no use a wheelchair needs help with transfers  Function retired  Neuro/Psych trouble walking  Prior Studies Any changes since last visit?  yes had fall and went to ED (~08/11/19)  Physicians involved in your care Any changes since last visit?  no   Family History  Problem Relation Age of Onset  . Hypertension Mother   . Heart disease Father   . Breast cancer Daughter 36  . Diabetes Maternal Aunt   . Coronary artery disease Paternal Aunt     . Breast cancer Paternal Aunt   . Coronary artery disease Paternal Uncle   . Heart disease Maternal Grandmother   . Heart disease Maternal Grandfather   . Heart disease Paternal Grandmother   . Heart disease Paternal Grandfather   . Colon cancer Neg Hx   . Stomach cancer Neg Hx   . Pancreatic cancer Neg Hx   . Rectal cancer Neg Hx    Social History   Socioeconomic History  . Marital status: Married    Spouse name: Not on file  . Number of children: 3  . Years of education: 75  . Highest education level: Not on file  Occupational History  . Occupation: retired Financial planner: retired  Tobacco Use  . Smoking status: Never Smoker  . Smokeless tobacco: Never Used  Substance and Sexual Activity  . Alcohol use: Yes    Alcohol/week: 0.0 standard drinks    Comment: occasional  . Drug use: No  . Sexual activity: Yes    Birth control/protection: Post-menopausal  Other Topics Concern  . Not on file  Social History Narrative   09/14/19 lives at home with husband   Regular exercise-yes---aerobics, Pilates, jogged in past (now walks)      Has living will   Husband, then one of her children, has health care POA   Would accept resuscitation but no prolonged artificial ventilation   Probably  would not want tube feeds if cognitively unaware   Social Determinants of Health   Financial Resource Strain:   . Difficulty of Paying Living Expenses:   Food Insecurity:   . Worried About Charity fundraiser in the Last Year:   . Arboriculturist in the Last Year:   Transportation Needs:   . Film/video editor (Medical):   Marland Kitchen Lack of Transportation (Non-Medical):   Physical Activity:   . Days of Exercise per Week:   . Minutes of Exercise per Session:   Stress:   . Feeling of Stress :   Social Connections:   . Frequency of Communication with Friends and Family:   . Frequency of Social Gatherings with Friends and Family:   . Attends Religious Services:   . Active  Member of Clubs or Organizations:   . Attends Archivist Meetings:   Marland Kitchen Marital Status:    Past Surgical History:  Procedure Laterality Date  . CATARACT EXTRACTION, BILATERAL Bilateral 2014  . EYE SURGERY     Obstructed tear duct  . FOOT SURGERY  2008  . MELANOMA EXCISION Left 11/2015   left lower leg  . TEAR DUCT PROBING  10/12   temporary stent  . TONSILLECTOMY AND ADENOIDECTOMY  1948   Past Medical History:  Diagnosis Date  . Fracture of metatarsal    Repair of fractured R metatarsal --Dr Duda---4/08  . GERD (gastroesophageal reflux disease)   . Hypertension   . Melanoma in situ [facility]) 7/17   Dr Kellie Moor  . Osteoporosis   . Seizure disorder ([place])   . Seizures ([place])   . Stroke ([place]) 06/2019  . Vitamin D deficiency    BP (!) 144/91   Pulse 70   Temp 97.7 F (36.5 C)   Ht 5\' 1"  (1.549 m)   Wt 114 lb 9.6 oz (52 kg)   SpO2 97%   BMI 21.65 kg/m   Opioid Risk Score:   Fall Risk Score:  `1  Depression screen PHQ 2/9  Depression screen [facility] 2/9 09/14/2019 03/14/2019 09/23/2017 09/15/2016 09/14/2015 09/08/2014 09/06/2013  Decreased Interest 0 0 0 0 0 0 0  Down, Depressed, Hopeless 0 0 0 0 0 0 0  PHQ - 2 Score 0 0 0 0 0 0 0    Review of Systems  Constitutional: Negative.   HENT: Negative.   Eyes: Negative.   Respiratory: Negative.   Cardiovascular: Negative.   Gastrointestinal: Positive for nausea.       Only occasional- doesn't want to take antiemetics  Endocrine: Negative.   Genitourinary: Negative.   Musculoskeletal: Positive for gait problem.  Skin: Negative.   Allergic/Immunologic: Negative.   Hematological: Negative.   Psychiatric/Behavioral: Negative.   All other systems reviewed and are negative.      Objective:   Physical Exam  Constitutional: No distress . Vital signs reviewed. Sitting up in [facility] with son at bedside.  HEENT: EOMI, oral membranes moist Neck: supple Cardiovascular: RRR without murmur. No JVD    Respiratory/Chest: CTA  Bilaterally without wheezes or rales. Normal effort    GI/Abdomen: BS +, non-tender, non-distended Ext: no clubbing, cyanosis, or edema Skin: No evidence of breakdown, no evidence of rash Neurologic: motor strength is 4+/5 in bilateral deltoid, bicep, tricep, grip, hip flexor, knee extensors, ankle dorsiflexor and plantar flexor--some diplopia and depth perception issues Did not attempt ambulation as she did not bring walker with her to appointment.  Cerebellar exam minimal  dysmetria Left l finger  to nose to finger Musculoskeletal: normal rom Psych: Very pleasant.       Assessment & Plan:  1.  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              Continue home therapy. Progressing well. Patient asked when she may be able to ambulate on her own without her husband's assistance. Advised that she still need's her husband's assistance at this time for safety but she will continue to improve with home therapy and her team will inform her when it is safe for her to ambulate independently.             -outpt ophtho assessment scheduled for next week as she is still having visual disturbance.                2. Pain Management: Headaches have resolved and she is not taking anything for pain. Has some lower back pain but not requiring Tylenol.    3. Mood: In good spirits             4.  Hypertension.  BP log reviewed and pressures have overall been well controlled.  Continue Norvasc 7.5mg  daily. Continue Toprol-XL 50 mg daily. Check BP daily and log results; please bring to follow-up appointments with PCP.    5.  History of seizure disorder.  Continue Depakote 500 mg every 12 hours              6. Dizziness and nausea: Resolved. Not requiring medications.    All questions answered. RTC PRN

## 2019-09-30 DIAGNOSIS — I69391 Dysphagia following cerebral infarction: Secondary | ICD-10-CM | POA: Diagnosis not present

## 2019-09-30 DIAGNOSIS — I69298 Other sequelae of other nontraumatic intracranial hemorrhage: Secondary | ICD-10-CM | POA: Diagnosis not present

## 2019-09-30 DIAGNOSIS — I10 Essential (primary) hypertension: Secondary | ICD-10-CM | POA: Diagnosis not present

## 2019-09-30 DIAGNOSIS — K219 Gastro-esophageal reflux disease without esophagitis: Secondary | ICD-10-CM | POA: Diagnosis not present

## 2019-09-30 DIAGNOSIS — G40909 Epilepsy, unspecified, not intractable, without status epilepticus: Secondary | ICD-10-CM | POA: Diagnosis not present

## 2019-09-30 DIAGNOSIS — I69293 Ataxia following other nontraumatic intracranial hemorrhage: Secondary | ICD-10-CM | POA: Diagnosis not present

## 2019-09-30 DIAGNOSIS — R1310 Dysphagia, unspecified: Secondary | ICD-10-CM | POA: Diagnosis not present

## 2019-09-30 DIAGNOSIS — H8190 Unspecified disorder of vestibular function, unspecified ear: Secondary | ICD-10-CM | POA: Diagnosis not present

## 2019-09-30 DIAGNOSIS — M810 Age-related osteoporosis without current pathological fracture: Secondary | ICD-10-CM | POA: Diagnosis not present

## 2019-10-04 DIAGNOSIS — I69293 Ataxia following other nontraumatic intracranial hemorrhage: Secondary | ICD-10-CM | POA: Diagnosis not present

## 2019-10-04 DIAGNOSIS — E559 Vitamin D deficiency, unspecified: Secondary | ICD-10-CM

## 2019-10-04 DIAGNOSIS — E785 Hyperlipidemia, unspecified: Secondary | ICD-10-CM

## 2019-10-04 DIAGNOSIS — Z85820 Personal history of malignant melanoma of skin: Secondary | ICD-10-CM

## 2019-10-04 DIAGNOSIS — M810 Age-related osteoporosis without current pathological fracture: Secondary | ICD-10-CM | POA: Diagnosis not present

## 2019-10-04 DIAGNOSIS — H532 Diplopia: Secondary | ICD-10-CM

## 2019-10-04 DIAGNOSIS — E876 Hypokalemia: Secondary | ICD-10-CM

## 2019-10-04 DIAGNOSIS — R1310 Dysphagia, unspecified: Secondary | ICD-10-CM | POA: Diagnosis not present

## 2019-10-04 DIAGNOSIS — I69391 Dysphagia following cerebral infarction: Secondary | ICD-10-CM | POA: Diagnosis not present

## 2019-10-04 DIAGNOSIS — H8190 Unspecified disorder of vestibular function, unspecified ear: Secondary | ICD-10-CM | POA: Diagnosis not present

## 2019-10-04 DIAGNOSIS — Z9181 History of falling: Secondary | ICD-10-CM

## 2019-10-04 DIAGNOSIS — K219 Gastro-esophageal reflux disease without esophagitis: Secondary | ICD-10-CM | POA: Diagnosis not present

## 2019-10-04 DIAGNOSIS — G40909 Epilepsy, unspecified, not intractable, without status epilepticus: Secondary | ICD-10-CM | POA: Diagnosis not present

## 2019-10-04 DIAGNOSIS — I69298 Other sequelae of other nontraumatic intracranial hemorrhage: Secondary | ICD-10-CM | POA: Diagnosis not present

## 2019-10-04 DIAGNOSIS — I10 Essential (primary) hypertension: Secondary | ICD-10-CM | POA: Diagnosis not present

## 2019-10-06 DIAGNOSIS — R1310 Dysphagia, unspecified: Secondary | ICD-10-CM | POA: Diagnosis not present

## 2019-10-06 DIAGNOSIS — M810 Age-related osteoporosis without current pathological fracture: Secondary | ICD-10-CM | POA: Diagnosis not present

## 2019-10-06 DIAGNOSIS — I10 Essential (primary) hypertension: Secondary | ICD-10-CM | POA: Diagnosis not present

## 2019-10-06 DIAGNOSIS — K219 Gastro-esophageal reflux disease without esophagitis: Secondary | ICD-10-CM | POA: Diagnosis not present

## 2019-10-06 DIAGNOSIS — I69293 Ataxia following other nontraumatic intracranial hemorrhage: Secondary | ICD-10-CM | POA: Diagnosis not present

## 2019-10-06 DIAGNOSIS — I69391 Dysphagia following cerebral infarction: Secondary | ICD-10-CM | POA: Diagnosis not present

## 2019-10-06 DIAGNOSIS — I69298 Other sequelae of other nontraumatic intracranial hemorrhage: Secondary | ICD-10-CM | POA: Diagnosis not present

## 2019-10-06 DIAGNOSIS — G40909 Epilepsy, unspecified, not intractable, without status epilepticus: Secondary | ICD-10-CM | POA: Diagnosis not present

## 2019-10-06 DIAGNOSIS — H8190 Unspecified disorder of vestibular function, unspecified ear: Secondary | ICD-10-CM | POA: Diagnosis not present

## 2019-10-07 DIAGNOSIS — H5582 Deficient smooth pursuit eye movements: Secondary | ICD-10-CM | POA: Diagnosis not present

## 2019-10-07 DIAGNOSIS — H5581 Saccadic eye movements: Secondary | ICD-10-CM | POA: Diagnosis not present

## 2019-10-07 DIAGNOSIS — Z8673 Personal history of transient ischemic attack (TIA), and cerebral infarction without residual deficits: Secondary | ICD-10-CM | POA: Diagnosis not present

## 2019-10-12 DIAGNOSIS — I10 Essential (primary) hypertension: Secondary | ICD-10-CM | POA: Diagnosis not present

## 2019-10-12 DIAGNOSIS — I69293 Ataxia following other nontraumatic intracranial hemorrhage: Secondary | ICD-10-CM | POA: Diagnosis not present

## 2019-10-12 DIAGNOSIS — I69391 Dysphagia following cerebral infarction: Secondary | ICD-10-CM | POA: Diagnosis not present

## 2019-10-12 DIAGNOSIS — K219 Gastro-esophageal reflux disease without esophagitis: Secondary | ICD-10-CM | POA: Diagnosis not present

## 2019-10-12 DIAGNOSIS — G40909 Epilepsy, unspecified, not intractable, without status epilepticus: Secondary | ICD-10-CM | POA: Diagnosis not present

## 2019-10-12 DIAGNOSIS — I69298 Other sequelae of other nontraumatic intracranial hemorrhage: Secondary | ICD-10-CM | POA: Diagnosis not present

## 2019-10-12 DIAGNOSIS — M810 Age-related osteoporosis without current pathological fracture: Secondary | ICD-10-CM | POA: Diagnosis not present

## 2019-10-12 DIAGNOSIS — R1310 Dysphagia, unspecified: Secondary | ICD-10-CM | POA: Diagnosis not present

## 2019-10-12 DIAGNOSIS — H8190 Unspecified disorder of vestibular function, unspecified ear: Secondary | ICD-10-CM | POA: Diagnosis not present

## 2019-10-13 DIAGNOSIS — I10 Essential (primary) hypertension: Secondary | ICD-10-CM | POA: Diagnosis not present

## 2019-10-13 DIAGNOSIS — H8190 Unspecified disorder of vestibular function, unspecified ear: Secondary | ICD-10-CM | POA: Diagnosis not present

## 2019-10-13 DIAGNOSIS — M810 Age-related osteoporosis without current pathological fracture: Secondary | ICD-10-CM | POA: Diagnosis not present

## 2019-10-13 DIAGNOSIS — I619 Nontraumatic intracerebral hemorrhage, unspecified: Secondary | ICD-10-CM | POA: Diagnosis not present

## 2019-10-13 DIAGNOSIS — R1310 Dysphagia, unspecified: Secondary | ICD-10-CM | POA: Diagnosis not present

## 2019-10-13 DIAGNOSIS — I69293 Ataxia following other nontraumatic intracranial hemorrhage: Secondary | ICD-10-CM | POA: Diagnosis not present

## 2019-10-13 DIAGNOSIS — K219 Gastro-esophageal reflux disease without esophagitis: Secondary | ICD-10-CM | POA: Diagnosis not present

## 2019-10-13 DIAGNOSIS — I69391 Dysphagia following cerebral infarction: Secondary | ICD-10-CM | POA: Diagnosis not present

## 2019-10-13 DIAGNOSIS — I69298 Other sequelae of other nontraumatic intracranial hemorrhage: Secondary | ICD-10-CM | POA: Diagnosis not present

## 2019-10-13 DIAGNOSIS — G40909 Epilepsy, unspecified, not intractable, without status epilepticus: Secondary | ICD-10-CM | POA: Diagnosis not present

## 2019-10-14 ENCOUNTER — Telehealth: Payer: Self-pay

## 2019-10-14 NOTE — Telephone Encounter (Signed)
Left verbal orders on verified VM 

## 2019-10-14 NOTE — Telephone Encounter (Signed)
Marlowe Kays with [facility] called requesting VO to extend OT x1 weekly for 4 weeks  Please advise

## 2019-10-14 NOTE — Telephone Encounter (Signed)
That is okay.

## 2019-10-17 DIAGNOSIS — I69391 Dysphagia following cerebral infarction: Secondary | ICD-10-CM | POA: Diagnosis not present

## 2019-10-17 DIAGNOSIS — I10 Essential (primary) hypertension: Secondary | ICD-10-CM | POA: Diagnosis not present

## 2019-10-17 DIAGNOSIS — M810 Age-related osteoporosis without current pathological fracture: Secondary | ICD-10-CM | POA: Diagnosis not present

## 2019-10-17 DIAGNOSIS — K219 Gastro-esophageal reflux disease without esophagitis: Secondary | ICD-10-CM | POA: Diagnosis not present

## 2019-10-17 DIAGNOSIS — I69298 Other sequelae of other nontraumatic intracranial hemorrhage: Secondary | ICD-10-CM | POA: Diagnosis not present

## 2019-10-17 DIAGNOSIS — R1310 Dysphagia, unspecified: Secondary | ICD-10-CM | POA: Diagnosis not present

## 2019-10-17 DIAGNOSIS — I69293 Ataxia following other nontraumatic intracranial hemorrhage: Secondary | ICD-10-CM | POA: Diagnosis not present

## 2019-10-17 DIAGNOSIS — G40909 Epilepsy, unspecified, not intractable, without status epilepticus: Secondary | ICD-10-CM | POA: Diagnosis not present

## 2019-10-17 DIAGNOSIS — H8190 Unspecified disorder of vestibular function, unspecified ear: Secondary | ICD-10-CM | POA: Diagnosis not present

## 2019-10-20 ENCOUNTER — Ambulatory Visit: Payer: Medicare PPO | Admitting: Internal Medicine

## 2019-10-20 ENCOUNTER — Encounter: Payer: Self-pay | Admitting: Internal Medicine

## 2019-10-20 ENCOUNTER — Other Ambulatory Visit: Payer: Self-pay

## 2019-10-20 DIAGNOSIS — G40909 Epilepsy, unspecified, not intractable, without status epilepticus: Secondary | ICD-10-CM | POA: Diagnosis not present

## 2019-10-20 DIAGNOSIS — H8190 Unspecified disorder of vestibular function, unspecified ear: Secondary | ICD-10-CM | POA: Diagnosis not present

## 2019-10-20 DIAGNOSIS — I10 Essential (primary) hypertension: Secondary | ICD-10-CM | POA: Diagnosis not present

## 2019-10-20 DIAGNOSIS — I69391 Dysphagia following cerebral infarction: Secondary | ICD-10-CM | POA: Diagnosis not present

## 2019-10-20 DIAGNOSIS — I618 Other nontraumatic intracerebral hemorrhage: Secondary | ICD-10-CM

## 2019-10-20 DIAGNOSIS — E441 Mild protein-calorie malnutrition: Secondary | ICD-10-CM | POA: Insufficient documentation

## 2019-10-20 DIAGNOSIS — K219 Gastro-esophageal reflux disease without esophagitis: Secondary | ICD-10-CM | POA: Diagnosis not present

## 2019-10-20 DIAGNOSIS — M810 Age-related osteoporosis without current pathological fracture: Secondary | ICD-10-CM | POA: Diagnosis not present

## 2019-10-20 DIAGNOSIS — R1310 Dysphagia, unspecified: Secondary | ICD-10-CM | POA: Diagnosis not present

## 2019-10-20 DIAGNOSIS — I69298 Other sequelae of other nontraumatic intracranial hemorrhage: Secondary | ICD-10-CM | POA: Diagnosis not present

## 2019-10-20 DIAGNOSIS — I69293 Ataxia following other nontraumatic intracranial hemorrhage: Secondary | ICD-10-CM | POA: Diagnosis not present

## 2019-10-20 NOTE — Assessment & Plan Note (Signed)
Urged her to start daily boost/ensure and high calorie density foods

## 2019-10-20 NOTE — Assessment & Plan Note (Signed)
Controlled with depakote No change needed

## 2019-10-20 NOTE — Progress Notes (Signed)
Subjective:    Patient ID: Tanya Knight, female    DOB: Aug 25, 1940, 79 y.o.   MRN: [id number]  HPI Here for follow up after intracerebral hemorrhage [name] is here This visit occurred during the SARS-CoV-2 public health emergency.  Safety protocols were in place, including screening questions prior to the visit, additional usage of staff PPE, and extensive cleaning of exam room while observing appropriate contact time as indicated for disinfecting solutions.   Has been improving Still walking with walker--asked her to continue this Dresses herself when sitting on bed Has shower chair Uses bathroom and generally continent  Still working with PT and OT  Mood has been okay No seizures Vision has improved--now able to watch TV  BP has been fine 124/76-138/82  Current Outpatient Medications on File Prior to Visit  Medication Sig Dispense Refill  . amLODipine (NORVASC) 2.5 MG tablet Take 3 tablets (7.5 mg total) by mouth daily. 90 tablet 2  . aspirin 81 MG EC tablet Take 81 mg by mouth daily. Swallow whole.    . cholecalciferol (VITAMIN D) 1000 units tablet Take 1 tablet (1,000 Units total) by mouth daily. 30 tablet 0  . divalproex (DEPAKOTE) 500 MG DR tablet Take 1 tablet (500 mg total) by mouth every 12 (twelve) hours. 60 tablet 0  . metoprolol succinate (TOPROL-XL) 50 MG 24 hr tablet Take 1 tablet (50 mg total) by mouth daily. Take with or immediately following a meal. 30 tablet 0  . Multiple Vitamin (MULTIVITAMIN) capsule Take 1 capsule by mouth daily.       No current facility-administered medications on file prior to visit.    Allergies  Allergen Reactions  . Phenytoin     Other reaction(s): Other (See Comments) Other Reaction: Other reaction REACTION: severe reaction    Past Medical History:  Diagnosis Date  . Fracture of metatarsal    Repair of fractured R metatarsal --Dr Duda---4/08  . GERD (gastroesophageal reflux disease)   . Hypertension   . Melanoma in  situ [facility]) 7/17   Dr Kellie Moor  . Osteoporosis   . Seizure disorder ([place])   . Seizures ([place])   . Stroke ([place]) 06/2019  . Vitamin D deficiency     Past Surgical History:  Procedure Laterality Date  . CATARACT EXTRACTION, BILATERAL Bilateral 2014  . EYE SURGERY     Obstructed tear duct  . FOOT SURGERY  2008  . MELANOMA EXCISION Left 11/2015   left lower leg  . TEAR DUCT PROBING  10/12   temporary stent  . TONSILLECTOMY AND ADENOIDECTOMY  1948    Family History  Problem Relation Age of Onset  . Hypertension Mother   . Heart disease Father   . Breast cancer Daughter 72  . Diabetes Maternal Aunt   . Coronary artery disease Paternal Aunt   . Breast cancer Paternal Aunt   . Coronary artery disease Paternal Uncle   . Heart disease Maternal Grandmother   . Heart disease Maternal Grandfather   . Heart disease Paternal Grandmother   . Heart disease Paternal Grandfather   . Colon cancer Neg Hx   . Stomach cancer Neg Hx   . Pancreatic cancer Neg Hx   . Rectal cancer Neg Hx     Social History   Socioeconomic History  . Marital status: Married    Spouse name: Not on file  . Number of children: 3  . Years of education: 29  . Highest education level: Not on file  Occupational  History  . Occupation: retired Financial planner: retired  Tobacco Use  . Smoking status: Never Smoker  . Smokeless tobacco: Never Used  Substance and Sexual Activity  . Alcohol use: Yes    Alcohol/week: 0.0 standard drinks    Comment: occasional  . Drug use: No  . Sexual activity: Yes    Birth control/protection: Post-menopausal  Other Topics Concern  . Not on file  Social History Narrative   09/14/19 lives at home with husband   Regular exercise-yes---aerobics, Pilates, jogged in past (now walks)      Has living will   Husband, then one of her children, has health care POA   Would accept resuscitation but no prolonged artificial ventilation   Probably would not want tube feeds if  cognitively unaware   Social Determinants of Health   Financial Resource Strain:   . Difficulty of Paying Living Expenses:   Food Insecurity:   . Worried About Charity fundraiser in the Last Year:   . Arboriculturist in the Last Year:   Transportation Needs:   . Film/video editor (Medical):   Marland Kitchen Lack of Transportation (Non-Medical):   Physical Activity:   . Days of Exercise per Week:   . Minutes of Exercise per Session:   Stress:   . Feeling of Stress :   Social Connections:   . Frequency of Communication with Friends and Family:   . Frequency of Social Gatherings with Friends and Family:   . Attends Religious Services:   . Active Member of Clubs or Organizations:   . Attends Archivist Meetings:   Marland Kitchen Marital Status:   Intimate Partner Violence:   . Fear of Current or Ex-Partner:   . Emotionally Abused:   Marland Kitchen Physically Abused:   . Sexually Abused:    Review of Systems Appetite is not great Headache at times--and stomach won't be settled Weight down 2# Sleeps well---naps in day. Not tired when doing something    Objective:   Physical Exam  Constitutional:  Mild wasting  Cardiovascular: Normal rate, regular rhythm and normal heart sounds.  Respiratory: Effort normal. No respiratory distress. She has no wheezes.  Lymphadenopathy:    She has no cervical adenopathy.  Neurological: She is alert.  No focal weakness  Psychiatric: Mood normal.           Assessment & Plan:

## 2019-10-20 NOTE — Assessment & Plan Note (Signed)
BP Readings from Last 3 Encounters:  10/20/19 136/86  09/29/19 (!) 144/91  09/14/19 132/90   Good control on amlodipine No changes needed

## 2019-10-20 NOTE — Assessment & Plan Note (Signed)
Slow progress with PT/OT.  Ocular involvement is being worked on as well Urged her to continue with walker for now

## 2019-10-24 DIAGNOSIS — I10 Essential (primary) hypertension: Secondary | ICD-10-CM | POA: Diagnosis not present

## 2019-10-24 DIAGNOSIS — M810 Age-related osteoporosis without current pathological fracture: Secondary | ICD-10-CM | POA: Diagnosis not present

## 2019-10-24 DIAGNOSIS — K219 Gastro-esophageal reflux disease without esophagitis: Secondary | ICD-10-CM | POA: Diagnosis not present

## 2019-10-24 DIAGNOSIS — I69391 Dysphagia following cerebral infarction: Secondary | ICD-10-CM | POA: Diagnosis not present

## 2019-10-24 DIAGNOSIS — I69293 Ataxia following other nontraumatic intracranial hemorrhage: Secondary | ICD-10-CM | POA: Diagnosis not present

## 2019-10-24 DIAGNOSIS — G40909 Epilepsy, unspecified, not intractable, without status epilepticus: Secondary | ICD-10-CM | POA: Diagnosis not present

## 2019-10-24 DIAGNOSIS — I69298 Other sequelae of other nontraumatic intracranial hemorrhage: Secondary | ICD-10-CM | POA: Diagnosis not present

## 2019-10-24 DIAGNOSIS — R1310 Dysphagia, unspecified: Secondary | ICD-10-CM | POA: Diagnosis not present

## 2019-10-24 DIAGNOSIS — H8190 Unspecified disorder of vestibular function, unspecified ear: Secondary | ICD-10-CM | POA: Diagnosis not present

## 2019-10-26 DIAGNOSIS — I10 Essential (primary) hypertension: Secondary | ICD-10-CM | POA: Diagnosis not present

## 2019-10-26 DIAGNOSIS — M810 Age-related osteoporosis without current pathological fracture: Secondary | ICD-10-CM | POA: Diagnosis not present

## 2019-10-26 DIAGNOSIS — I69293 Ataxia following other nontraumatic intracranial hemorrhage: Secondary | ICD-10-CM | POA: Diagnosis not present

## 2019-10-26 DIAGNOSIS — H8190 Unspecified disorder of vestibular function, unspecified ear: Secondary | ICD-10-CM | POA: Diagnosis not present

## 2019-10-26 DIAGNOSIS — K219 Gastro-esophageal reflux disease without esophagitis: Secondary | ICD-10-CM | POA: Diagnosis not present

## 2019-10-26 DIAGNOSIS — R1310 Dysphagia, unspecified: Secondary | ICD-10-CM | POA: Diagnosis not present

## 2019-10-26 DIAGNOSIS — I69298 Other sequelae of other nontraumatic intracranial hemorrhage: Secondary | ICD-10-CM | POA: Diagnosis not present

## 2019-10-26 DIAGNOSIS — I69391 Dysphagia following cerebral infarction: Secondary | ICD-10-CM | POA: Diagnosis not present

## 2019-10-26 DIAGNOSIS — G40909 Epilepsy, unspecified, not intractable, without status epilepticus: Secondary | ICD-10-CM | POA: Diagnosis not present

## 2019-10-31 DIAGNOSIS — K219 Gastro-esophageal reflux disease without esophagitis: Secondary | ICD-10-CM | POA: Diagnosis not present

## 2019-10-31 DIAGNOSIS — M810 Age-related osteoporosis without current pathological fracture: Secondary | ICD-10-CM | POA: Diagnosis not present

## 2019-10-31 DIAGNOSIS — H8190 Unspecified disorder of vestibular function, unspecified ear: Secondary | ICD-10-CM | POA: Diagnosis not present

## 2019-10-31 DIAGNOSIS — R1310 Dysphagia, unspecified: Secondary | ICD-10-CM | POA: Diagnosis not present

## 2019-10-31 DIAGNOSIS — I69293 Ataxia following other nontraumatic intracranial hemorrhage: Secondary | ICD-10-CM | POA: Diagnosis not present

## 2019-10-31 DIAGNOSIS — I69298 Other sequelae of other nontraumatic intracranial hemorrhage: Secondary | ICD-10-CM | POA: Diagnosis not present

## 2019-10-31 DIAGNOSIS — I69391 Dysphagia following cerebral infarction: Secondary | ICD-10-CM | POA: Diagnosis not present

## 2019-10-31 DIAGNOSIS — I10 Essential (primary) hypertension: Secondary | ICD-10-CM | POA: Diagnosis not present

## 2019-10-31 DIAGNOSIS — G40909 Epilepsy, unspecified, not intractable, without status epilepticus: Secondary | ICD-10-CM | POA: Diagnosis not present

## 2019-11-07 ENCOUNTER — Other Ambulatory Visit: Payer: Self-pay

## 2019-11-07 ENCOUNTER — Ambulatory Visit: Payer: Medicare PPO | Admitting: Internal Medicine

## 2019-11-07 ENCOUNTER — Encounter: Payer: Self-pay | Admitting: Internal Medicine

## 2019-11-07 VITALS — BP 128/72 | HR 67 | Temp 97.2°F | Wt 113.8 lb

## 2019-11-07 DIAGNOSIS — K219 Gastro-esophageal reflux disease without esophagitis: Secondary | ICD-10-CM | POA: Diagnosis not present

## 2019-11-07 DIAGNOSIS — R300 Dysuria: Secondary | ICD-10-CM

## 2019-11-07 DIAGNOSIS — I69298 Other sequelae of other nontraumatic intracranial hemorrhage: Secondary | ICD-10-CM | POA: Diagnosis not present

## 2019-11-07 DIAGNOSIS — I10 Essential (primary) hypertension: Secondary | ICD-10-CM | POA: Diagnosis not present

## 2019-11-07 DIAGNOSIS — I69293 Ataxia following other nontraumatic intracranial hemorrhage: Secondary | ICD-10-CM | POA: Diagnosis not present

## 2019-11-07 DIAGNOSIS — G40909 Epilepsy, unspecified, not intractable, without status epilepticus: Secondary | ICD-10-CM | POA: Diagnosis not present

## 2019-11-07 DIAGNOSIS — M810 Age-related osteoporosis without current pathological fracture: Secondary | ICD-10-CM | POA: Diagnosis not present

## 2019-11-07 DIAGNOSIS — H8190 Unspecified disorder of vestibular function, unspecified ear: Secondary | ICD-10-CM | POA: Diagnosis not present

## 2019-11-07 DIAGNOSIS — N3000 Acute cystitis without hematuria: Secondary | ICD-10-CM | POA: Diagnosis not present

## 2019-11-07 DIAGNOSIS — R1310 Dysphagia, unspecified: Secondary | ICD-10-CM | POA: Diagnosis not present

## 2019-11-07 DIAGNOSIS — I69391 Dysphagia following cerebral infarction: Secondary | ICD-10-CM | POA: Diagnosis not present

## 2019-11-07 LAB — POC URINALSYSI DIPSTICK (AUTOMATED)
Bilirubin, UA: NEGATIVE
Blood, UA: 50
Glucose, UA: NEGATIVE
Ketones, UA: NEGATIVE
Nitrite, UA: NEGATIVE
Protein, UA: POSITIVE — AB
Spec Grav, UA: 1.01 (ref 1.010–1.025)
Urobilinogen, UA: 0.2 E.U./dL
pH, UA: 7 (ref 5.0–8.0)

## 2019-11-07 MED ORDER — AMLODIPINE BESYLATE 2.5 MG PO TABS: 7.5000 mg | ORAL_TABLET | Freq: Every day | ORAL | 3 refills | Status: DC

## 2019-11-07 MED ORDER — SULFAMETHOXAZOLE-TRIMETHOPRIM 800-160 MG PO TABS: 1.0000 | ORAL_TABLET | Freq: Two times a day (BID) | ORAL | 0 refills | Status: DC

## 2019-11-07 MED ORDER — DIVALPROEX SODIUM 500 MG PO DR TAB: 500.0000 mg | DELAYED_RELEASE_TABLET | Freq: Two times a day (BID) | ORAL | 3 refills | Status: DC

## 2019-11-07 MED ORDER — METOPROLOL SUCCINATE ER 50 MG PO TB24: 50.0000 mg | ORAL_TABLET | Freq: Every day | ORAL | 3 refills | Status: DC

## 2019-11-07 NOTE — Assessment & Plan Note (Signed)
Classic symptoms for 2 days Urine shows leukocytes, etc Will empirically treat with septra---3 days may be enough Send C&S just in case

## 2019-11-07 NOTE — Patient Instructions (Signed)
Please start the antibiotic right away and take 2 doses today. If your symptoms are gone tomorrow, you can stop the antibiotic after 3 days (and save the rest in case you get a recurrence).

## 2019-11-07 NOTE — Progress Notes (Signed)
Subjective:    Patient ID: Tanya Knight, female    DOB: May 15, 1940, 79 y.o.   MRN: [id number]  HPI Here due to urinary symptoms This visit occurred during the SARS-CoV-2 public health emergency.  Safety protocols were in place, including screening questions prior to the visit, additional usage of staff PPE, and extensive cleaning of exam room while observing appropriate contact time as indicated for disinfecting solutions.   Having burning with urination Also sometimes even without voiding Started 2 days ago No blood No fever Has been going more frequently and urgency  Current Outpatient Medications on File Prior to Visit  Medication Sig Dispense Refill   amLODipine (NORVASC) 2.5 MG tablet Take 3 tablets (7.5 mg total) by mouth daily. 90 tablet 2   aspirin 81 MG EC tablet Take 81 mg by mouth daily. Swallow whole.     cholecalciferol (VITAMIN D) 1000 units tablet Take 1 tablet (1,000 Units total) by mouth daily. 30 tablet 0   divalproex (DEPAKOTE) 500 MG DR tablet Take 1 tablet (500 mg total) by mouth every 12 (twelve) hours. 60 tablet 0   metoprolol succinate (TOPROL-XL) 50 MG 24 hr tablet Take 1 tablet (50 mg total) by mouth daily. Take with or immediately following a meal. 30 tablet 0   Multiple Vitamin (MULTIVITAMIN) capsule Take 1 capsule by mouth daily.       No current facility-administered medications on file prior to visit.    Allergies  Allergen Reactions   Phenytoin     Other reaction(s): Other (See Comments) Other Reaction: Other reaction REACTION: severe reaction    Past Medical History:  Diagnosis Date   Fracture of metatarsal    Repair of fractured R metatarsal --Dr Duda---4/08   GERD (gastroesophageal reflux disease)    Hypertension    Melanoma in situ ([place]) 7/17   Dr Kellie Moor   Osteoporosis    Seizure disorder ([place])    Seizures ([employer])    Stroke ([place]) 06/2019   Vitamin D deficiency     Past Surgical History:  Procedure Laterality  Date   CATARACT EXTRACTION, BILATERAL Bilateral 2014   EYE SURGERY     Obstructed tear duct   FOOT SURGERY  2008   MELANOMA EXCISION Left 11/2015   left lower leg   TEAR DUCT PROBING  10/12   temporary stent   TONSILLECTOMY AND ADENOIDECTOMY  1948    Family History  Problem Relation Age of Onset   Hypertension Mother    Heart disease Father    Breast cancer Daughter 9   Diabetes Maternal Aunt    Coronary artery disease Paternal Aunt    Breast cancer Paternal Aunt    Coronary artery disease Paternal Uncle    Heart disease Maternal Grandmother    Heart disease Maternal Grandfather    Heart disease Paternal Grandmother    Heart disease Paternal Grandfather    Colon cancer Neg Hx    Stomach cancer Neg Hx    Pancreatic cancer Neg Hx    Rectal cancer Neg Hx     Social History   Socioeconomic History   Marital status: Married    Spouse name: Not on file   Number of children: 3   Years of education: 12   Highest education level: Not on file  Occupational History   Occupation: retired Financial planner: retired  Tobacco Use   Smoking status: Never Smoker   Smokeless tobacco: Never Used  Substance and Sexual Activity  Alcohol use: Yes    Alcohol/week: 0.0 standard drinks    Comment: occasional   Drug use: No   Sexual activity: Yes    Birth control/protection: Post-menopausal  Other Topics Concern   Not on file  Social History Narrative   09/14/19 lives at home with husband   Regular exercise-yes---aerobics, Pilates, jogged in past (now walks)      Has living will   Husband, then one of her children, has health care POA   Would accept resuscitation but no prolonged artificial ventilation   Probably would not want tube feeds if cognitively unaware   Social Determinants of Health   Financial Resource Strain:    Difficulty of Paying Living Expenses:   Food Insecurity:    Worried About Charity fundraiser in the Last  Year:    Arboriculturist in the Last Year:   Transportation Needs:    Film/video editor (Medical):    Lack of Transportation (Non-Medical):   Physical Activity:    Days of Exercise per Week:    Minutes of Exercise per Session:   Stress:    Feeling of Stress :   Social Connections:    Frequency of Communication with Friends and Family:    Frequency of Social Gatherings with Friends and Family:    Attends Religious Services:    Active Member of Clubs or Organizations:    Attends Music therapist:    Marital Status:   Intimate Partner Violence:    Fear of Current or Ex-Partner:    Emotionally Abused:    Physically Abused:    Sexually Abused:    Review of Systems No N/V Eating okay No back pain    Objective:   Physical Exam Abdominal:     Palpations: Abdomen is soft.     Tenderness: There is no abdominal tenderness.  Musculoskeletal:     Comments: No CVA tenderness            Assessment & Plan:

## 2019-11-07 NOTE — Addendum Note (Signed)
Addended by: Tammi Sou on: 11/07/2019 02:28 PM   Modules accepted: Orders

## 2019-11-08 LAB — URINE CULTURE
MICRO NUMBER:: 10641959
Result:: NO GROWTH
SPECIMEN QUALITY:: ADEQUATE

## 2019-11-09 DIAGNOSIS — M810 Age-related osteoporosis without current pathological fracture: Secondary | ICD-10-CM | POA: Diagnosis not present

## 2019-11-09 DIAGNOSIS — I10 Essential (primary) hypertension: Secondary | ICD-10-CM | POA: Diagnosis not present

## 2019-11-09 DIAGNOSIS — R1310 Dysphagia, unspecified: Secondary | ICD-10-CM | POA: Diagnosis not present

## 2019-11-09 DIAGNOSIS — G40909 Epilepsy, unspecified, not intractable, without status epilepticus: Secondary | ICD-10-CM | POA: Diagnosis not present

## 2019-11-09 DIAGNOSIS — H8190 Unspecified disorder of vestibular function, unspecified ear: Secondary | ICD-10-CM | POA: Diagnosis not present

## 2019-11-09 DIAGNOSIS — I69293 Ataxia following other nontraumatic intracranial hemorrhage: Secondary | ICD-10-CM | POA: Diagnosis not present

## 2019-11-09 DIAGNOSIS — K219 Gastro-esophageal reflux disease without esophagitis: Secondary | ICD-10-CM | POA: Diagnosis not present

## 2019-11-09 DIAGNOSIS — I69391 Dysphagia following cerebral infarction: Secondary | ICD-10-CM | POA: Diagnosis not present

## 2019-11-09 DIAGNOSIS — I69298 Other sequelae of other nontraumatic intracranial hemorrhage: Secondary | ICD-10-CM | POA: Diagnosis not present

## 2019-11-10 ENCOUNTER — Telehealth: Payer: Self-pay | Admitting: Internal Medicine

## 2019-11-10 NOTE — Telephone Encounter (Signed)
Clearly seems to be a reaction to the septra. Urinary symptoms have resolved. Finished it yesterday but still not right  Discussed trying tums that she has--if she can get it down Sips of water and light meal as tolerated  [place], Please check on her in the morning

## 2019-11-10 NOTE — Telephone Encounter (Signed)
Patient called  She stated she was in on Monday for a uti. She was prescribed sulfamethoxazole-trimethoprim (BACTRIM DS). Patient stated she has been taking this as advised. But she is having a hard time drinking water. She stated she has a headache, nausea and acid in her stomach to the point she is vomiting.  Patient wanted to know what she should do from here.

## 2019-11-11 DIAGNOSIS — H8190 Unspecified disorder of vestibular function, unspecified ear: Secondary | ICD-10-CM | POA: Diagnosis not present

## 2019-11-11 DIAGNOSIS — I10 Essential (primary) hypertension: Secondary | ICD-10-CM | POA: Diagnosis not present

## 2019-11-11 DIAGNOSIS — R1310 Dysphagia, unspecified: Secondary | ICD-10-CM | POA: Diagnosis not present

## 2019-11-11 DIAGNOSIS — M810 Age-related osteoporosis without current pathological fracture: Secondary | ICD-10-CM | POA: Diagnosis not present

## 2019-11-11 DIAGNOSIS — K219 Gastro-esophageal reflux disease without esophagitis: Secondary | ICD-10-CM | POA: Diagnosis not present

## 2019-11-11 DIAGNOSIS — I69391 Dysphagia following cerebral infarction: Secondary | ICD-10-CM | POA: Diagnosis not present

## 2019-11-11 DIAGNOSIS — I69293 Ataxia following other nontraumatic intracranial hemorrhage: Secondary | ICD-10-CM | POA: Diagnosis not present

## 2019-11-11 DIAGNOSIS — I69298 Other sequelae of other nontraumatic intracranial hemorrhage: Secondary | ICD-10-CM | POA: Diagnosis not present

## 2019-11-11 DIAGNOSIS — G40909 Epilepsy, unspecified, not intractable, without status epilepticus: Secondary | ICD-10-CM | POA: Diagnosis not present

## 2019-11-11 NOTE — Telephone Encounter (Signed)
Spoke to pt. She will start taking omeprazole tomorrow.

## 2019-11-11 NOTE — Telephone Encounter (Signed)
As long as she can eat and drink--we can just wait. She should get OTC omeprazole and start on an empty stomach daily (20mg ) for 2 weeks Check her again tomorrow

## 2019-11-11 NOTE — Telephone Encounter (Signed)
Spoke to pt. Still having a lot of acid coming up. Head is better. "Still not right".

## 2019-11-12 DIAGNOSIS — I10 Essential (primary) hypertension: Secondary | ICD-10-CM | POA: Diagnosis not present

## 2019-11-12 DIAGNOSIS — I69293 Ataxia following other nontraumatic intracranial hemorrhage: Secondary | ICD-10-CM | POA: Diagnosis not present

## 2019-11-12 DIAGNOSIS — I619 Nontraumatic intracerebral hemorrhage, unspecified: Secondary | ICD-10-CM | POA: Diagnosis not present

## 2019-11-12 DIAGNOSIS — G40909 Epilepsy, unspecified, not intractable, without status epilepticus: Secondary | ICD-10-CM | POA: Diagnosis not present

## 2019-11-16 DIAGNOSIS — I10 Essential (primary) hypertension: Secondary | ICD-10-CM | POA: Diagnosis not present

## 2019-11-16 DIAGNOSIS — R1310 Dysphagia, unspecified: Secondary | ICD-10-CM | POA: Diagnosis not present

## 2019-11-16 DIAGNOSIS — G40909 Epilepsy, unspecified, not intractable, without status epilepticus: Secondary | ICD-10-CM | POA: Diagnosis not present

## 2019-11-16 DIAGNOSIS — K219 Gastro-esophageal reflux disease without esophagitis: Secondary | ICD-10-CM | POA: Diagnosis not present

## 2019-11-16 DIAGNOSIS — I69391 Dysphagia following cerebral infarction: Secondary | ICD-10-CM | POA: Diagnosis not present

## 2019-11-16 DIAGNOSIS — H8190 Unspecified disorder of vestibular function, unspecified ear: Secondary | ICD-10-CM | POA: Diagnosis not present

## 2019-11-16 DIAGNOSIS — I69293 Ataxia following other nontraumatic intracranial hemorrhage: Secondary | ICD-10-CM | POA: Diagnosis not present

## 2019-11-16 DIAGNOSIS — M810 Age-related osteoporosis without current pathological fracture: Secondary | ICD-10-CM | POA: Diagnosis not present

## 2019-11-16 DIAGNOSIS — I69298 Other sequelae of other nontraumatic intracranial hemorrhage: Secondary | ICD-10-CM | POA: Diagnosis not present

## 2019-11-18 DIAGNOSIS — K219 Gastro-esophageal reflux disease without esophagitis: Secondary | ICD-10-CM | POA: Diagnosis not present

## 2019-11-18 DIAGNOSIS — I69298 Other sequelae of other nontraumatic intracranial hemorrhage: Secondary | ICD-10-CM | POA: Diagnosis not present

## 2019-11-18 DIAGNOSIS — H8190 Unspecified disorder of vestibular function, unspecified ear: Secondary | ICD-10-CM | POA: Diagnosis not present

## 2019-11-18 DIAGNOSIS — G40909 Epilepsy, unspecified, not intractable, without status epilepticus: Secondary | ICD-10-CM | POA: Diagnosis not present

## 2019-11-18 DIAGNOSIS — M810 Age-related osteoporosis without current pathological fracture: Secondary | ICD-10-CM | POA: Diagnosis not present

## 2019-11-18 DIAGNOSIS — R1310 Dysphagia, unspecified: Secondary | ICD-10-CM | POA: Diagnosis not present

## 2019-11-18 DIAGNOSIS — I10 Essential (primary) hypertension: Secondary | ICD-10-CM | POA: Diagnosis not present

## 2019-11-18 DIAGNOSIS — I69293 Ataxia following other nontraumatic intracranial hemorrhage: Secondary | ICD-10-CM | POA: Diagnosis not present

## 2019-11-18 DIAGNOSIS — I69391 Dysphagia following cerebral infarction: Secondary | ICD-10-CM | POA: Diagnosis not present

## 2019-11-21 DIAGNOSIS — H8190 Unspecified disorder of vestibular function, unspecified ear: Secondary | ICD-10-CM | POA: Diagnosis not present

## 2019-11-21 DIAGNOSIS — I69293 Ataxia following other nontraumatic intracranial hemorrhage: Secondary | ICD-10-CM | POA: Diagnosis not present

## 2019-11-21 DIAGNOSIS — M810 Age-related osteoporosis without current pathological fracture: Secondary | ICD-10-CM | POA: Diagnosis not present

## 2019-11-21 DIAGNOSIS — G40909 Epilepsy, unspecified, not intractable, without status epilepticus: Secondary | ICD-10-CM | POA: Diagnosis not present

## 2019-11-21 DIAGNOSIS — I69391 Dysphagia following cerebral infarction: Secondary | ICD-10-CM | POA: Diagnosis not present

## 2019-11-21 DIAGNOSIS — R1310 Dysphagia, unspecified: Secondary | ICD-10-CM | POA: Diagnosis not present

## 2019-11-21 DIAGNOSIS — I10 Essential (primary) hypertension: Secondary | ICD-10-CM | POA: Diagnosis not present

## 2019-11-21 DIAGNOSIS — K219 Gastro-esophageal reflux disease without esophagitis: Secondary | ICD-10-CM | POA: Diagnosis not present

## 2019-11-21 DIAGNOSIS — I69298 Other sequelae of other nontraumatic intracranial hemorrhage: Secondary | ICD-10-CM | POA: Diagnosis not present

## 2019-11-22 DIAGNOSIS — R1310 Dysphagia, unspecified: Secondary | ICD-10-CM | POA: Diagnosis not present

## 2019-11-22 DIAGNOSIS — I10 Essential (primary) hypertension: Secondary | ICD-10-CM | POA: Diagnosis not present

## 2019-11-22 DIAGNOSIS — K219 Gastro-esophageal reflux disease without esophagitis: Secondary | ICD-10-CM | POA: Diagnosis not present

## 2019-11-22 DIAGNOSIS — G40909 Epilepsy, unspecified, not intractable, without status epilepticus: Secondary | ICD-10-CM | POA: Diagnosis not present

## 2019-11-22 DIAGNOSIS — I69293 Ataxia following other nontraumatic intracranial hemorrhage: Secondary | ICD-10-CM | POA: Diagnosis not present

## 2019-11-22 DIAGNOSIS — M810 Age-related osteoporosis without current pathological fracture: Secondary | ICD-10-CM | POA: Diagnosis not present

## 2019-11-22 DIAGNOSIS — I69391 Dysphagia following cerebral infarction: Secondary | ICD-10-CM | POA: Diagnosis not present

## 2019-11-22 DIAGNOSIS — H8190 Unspecified disorder of vestibular function, unspecified ear: Secondary | ICD-10-CM | POA: Diagnosis not present

## 2019-11-22 DIAGNOSIS — I69298 Other sequelae of other nontraumatic intracranial hemorrhage: Secondary | ICD-10-CM | POA: Diagnosis not present

## 2019-11-28 DIAGNOSIS — I69298 Other sequelae of other nontraumatic intracranial hemorrhage: Secondary | ICD-10-CM | POA: Diagnosis not present

## 2019-11-28 DIAGNOSIS — M810 Age-related osteoporosis without current pathological fracture: Secondary | ICD-10-CM | POA: Diagnosis not present

## 2019-11-28 DIAGNOSIS — G40909 Epilepsy, unspecified, not intractable, without status epilepticus: Secondary | ICD-10-CM | POA: Diagnosis not present

## 2019-11-28 DIAGNOSIS — R1310 Dysphagia, unspecified: Secondary | ICD-10-CM | POA: Diagnosis not present

## 2019-11-28 DIAGNOSIS — K219 Gastro-esophageal reflux disease without esophagitis: Secondary | ICD-10-CM | POA: Diagnosis not present

## 2019-11-28 DIAGNOSIS — I69391 Dysphagia following cerebral infarction: Secondary | ICD-10-CM | POA: Diagnosis not present

## 2019-11-28 DIAGNOSIS — I10 Essential (primary) hypertension: Secondary | ICD-10-CM | POA: Diagnosis not present

## 2019-11-28 DIAGNOSIS — H8190 Unspecified disorder of vestibular function, unspecified ear: Secondary | ICD-10-CM | POA: Diagnosis not present

## 2019-11-28 DIAGNOSIS — I69293 Ataxia following other nontraumatic intracranial hemorrhage: Secondary | ICD-10-CM | POA: Diagnosis not present

## 2019-11-29 ENCOUNTER — Other Ambulatory Visit: Payer: Self-pay

## 2019-11-29 ENCOUNTER — Emergency Department: Payer: Medicare PPO

## 2019-11-29 ENCOUNTER — Encounter: Payer: Self-pay | Admitting: *Deleted

## 2019-11-29 ENCOUNTER — Observation Stay
Admission: EM | Admit: 2019-11-29 | Discharge: 2019-12-01 | Disposition: A | Discharge status: Home / Self Care | Payer: Medicare PPO | Attending: Internal Medicine | Admitting: Internal Medicine

## 2019-11-29 DIAGNOSIS — G40909 Epilepsy, unspecified, not intractable, without status epilepticus: Secondary | ICD-10-CM

## 2019-11-29 DIAGNOSIS — W1839XA Other fall on same level, initial encounter: Secondary | ICD-10-CM | POA: Insufficient documentation

## 2019-11-29 DIAGNOSIS — E782 Mixed hyperlipidemia: Secondary | ICD-10-CM

## 2019-11-29 DIAGNOSIS — R2689 Other abnormalities of gait and mobility: Secondary | ICD-10-CM | POA: Insufficient documentation

## 2019-11-29 DIAGNOSIS — S2231XA Fracture of one rib, right side, initial encounter for closed fracture: Principal | ICD-10-CM | POA: Insufficient documentation

## 2019-11-29 DIAGNOSIS — K219 Gastro-esophageal reflux disease without esophagitis: Secondary | ICD-10-CM | POA: Diagnosis present

## 2019-11-29 DIAGNOSIS — Y9289 Other specified places as the place of occurrence of the external cause: Secondary | ICD-10-CM | POA: Insufficient documentation

## 2019-11-29 DIAGNOSIS — S299XXA Unspecified injury of thorax, initial encounter: Secondary | ICD-10-CM | POA: Diagnosis present

## 2019-11-29 DIAGNOSIS — Y999 Unspecified external cause status: Secondary | ICD-10-CM | POA: Insufficient documentation

## 2019-11-29 DIAGNOSIS — Z20822 Contact with and (suspected) exposure to covid-19: Secondary | ICD-10-CM | POA: Insufficient documentation

## 2019-11-29 DIAGNOSIS — S2239XA Fracture of one rib, unspecified side, initial encounter for closed fracture: Secondary | ICD-10-CM | POA: Diagnosis present

## 2019-11-29 DIAGNOSIS — S2241XA Multiple fractures of ribs, right side, initial encounter for closed fracture: Secondary | ICD-10-CM

## 2019-11-29 DIAGNOSIS — I69391 Dysphagia following cerebral infarction: Secondary | ICD-10-CM

## 2019-11-29 DIAGNOSIS — I10 Essential (primary) hypertension: Secondary | ICD-10-CM | POA: Diagnosis not present

## 2019-11-29 DIAGNOSIS — S065X9A Traumatic subdural hemorrhage with loss of consciousness of unspecified duration, initial encounter: Secondary | ICD-10-CM

## 2019-11-29 DIAGNOSIS — Y9301 Activity, walking, marching and hiking: Secondary | ICD-10-CM | POA: Diagnosis not present

## 2019-11-29 DIAGNOSIS — S065X0A Traumatic subdural hemorrhage without loss of consciousness, initial encounter: Secondary | ICD-10-CM | POA: Diagnosis not present

## 2019-11-29 DIAGNOSIS — Z79899 Other long term (current) drug therapy: Secondary | ICD-10-CM | POA: Insufficient documentation

## 2019-11-29 DIAGNOSIS — Z7982 Long term (current) use of aspirin: Secondary | ICD-10-CM | POA: Insufficient documentation

## 2019-11-29 DIAGNOSIS — R4789 Other speech disturbances: Secondary | ICD-10-CM | POA: Diagnosis present

## 2019-11-29 DIAGNOSIS — S065XAA Traumatic subdural hemorrhage with loss of consciousness status unknown, initial encounter: Secondary | ICD-10-CM | POA: Diagnosis present

## 2019-11-29 DIAGNOSIS — S22000A Wedge compression fracture of unspecified thoracic vertebra, initial encounter for closed fracture: Secondary | ICD-10-CM | POA: Diagnosis not present

## 2019-11-29 DIAGNOSIS — R55 Syncope and collapse: Secondary | ICD-10-CM | POA: Diagnosis present

## 2019-11-29 DIAGNOSIS — E441 Mild protein-calorie malnutrition: Secondary | ICD-10-CM | POA: Diagnosis not present

## 2019-11-29 DIAGNOSIS — E785 Hyperlipidemia, unspecified: Secondary | ICD-10-CM | POA: Diagnosis present

## 2019-11-29 LAB — URINALYSIS, COMPLETE (UACMP) WITH MICROSCOPIC
Bacteria, UA: NONE SEEN
Bilirubin Urine: NEGATIVE
Glucose, UA: NEGATIVE mg/dL
Hgb urine dipstick: NEGATIVE
Ketones, ur: 5 mg/dL — AB
Leukocytes,Ua: NEGATIVE
Nitrite: NEGATIVE
Protein, ur: NEGATIVE mg/dL
Specific Gravity, Urine: 1.013 (ref 1.005–1.030)
Squamous Epithelial / HPF: NONE SEEN (ref 0–5)
pH: 7 (ref 5.0–8.0)

## 2019-11-29 LAB — COMPREHENSIVE METABOLIC PANEL
ALT: 13 U/L (ref 0–44)
AST: 21 U/L (ref 15–41)
Albumin: 4.2 g/dL (ref 3.5–5.0)
Alkaline Phosphatase: 69 U/L (ref 38–126)
Anion gap: 9 (ref 5–15)
BUN: 20 mg/dL (ref 8–23)
CO2: 30 mmol/L (ref 22–32)
Calcium: 9.7 mg/dL (ref 8.9–10.3)
Chloride: 94 mmol/L — ABNORMAL LOW (ref 98–111)
Creatinine, Ser: 0.67 mg/dL (ref 0.44–1.00)
GFR calc Af Amer: >60 mL/min (ref >60)
GFR calc non Af Amer: >60 mL/min (ref >60)
Glucose, Bld: 124 mg/dL — ABNORMAL HIGH (ref 70–99)
Potassium: 3.9 mmol/L (ref 3.5–5.1)
Sodium: 133 mmol/L — ABNORMAL LOW (ref 135–145)
Total Bilirubin: 0.7 mg/dL (ref 0.3–1.2)
Total Protein: 7.1 g/dL (ref 6.5–8.1)

## 2019-11-29 LAB — CBC
HCT: 43.6 % (ref 36.0–46.0)
Hemoglobin: 14.7 g/dL (ref 12.0–15.0)
MCH: 31.1 pg (ref 26.0–34.0)
MCHC: 33.7 g/dL (ref 30.0–36.0)
MCV: 92.4 fL (ref 80.0–100.0)
Platelets: 246 10*3/uL (ref 150–400)
RBC: 4.72 MIL/uL (ref 3.87–5.11)
RDW: 12.9 % (ref 11.5–15.5)
WBC: 6.6 10*3/uL (ref 4.0–10.5)
nRBC: 0 % (ref 0.0–0.2)

## 2019-11-29 LAB — DIFFERENTIAL
Abs Immature Granulocytes: 0.05 10*3/uL (ref 0.00–0.07)
Basophils Absolute: 0.1 10*3/uL (ref 0.0–0.1)
Basophils Relative: 1 %
Eosinophils Absolute: 0.1 10*3/uL (ref 0.0–0.5)
Eosinophils Relative: 2 %
Immature Granulocytes: 1 %
Lymphocytes Relative: 18 %
Lymphs Abs: 1.2 10*3/uL (ref 0.7–4.0)
Monocytes Absolute: 0.6 10*3/uL (ref 0.1–1.0)
Monocytes Relative: 10 %
Neutro Abs: 4.5 10*3/uL (ref 1.7–7.7)
Neutrophils Relative %: 68 %

## 2019-11-29 LAB — VALPROIC ACID LEVEL: Valproic Acid Lvl: 52 ug/mL (ref 50.0–100.0)

## 2019-11-29 LAB — APTT: aPTT: 44 seconds — ABNORMAL HIGH (ref 24–36)

## 2019-11-29 LAB — PROTIME-INR
INR: 1 (ref 0.8–1.2)
Prothrombin Time: 12.5 seconds (ref 11.4–15.2)

## 2019-11-29 LAB — TROPONIN I (HIGH SENSITIVITY): Troponin I (High Sensitivity): 5 ng/L (ref <18)

## 2019-11-29 LAB — SARS CORONAVIRUS 2 BY RT PCR (HOSPITAL ORDER, PERFORMED IN ~~LOC~~ HOSPITAL LAB): SARS Coronavirus 2: NEGATIVE

## 2019-11-29 IMAGING — CR DG CHEST 2V
1 series · 2 of 2 positions shown · non-contrast
Comparison: [DATE]

CLINICAL DATA: Rib pain, fall,

EXAM:
CHEST - 2 VIEW

[Series 1: dg chest 2 view · 0.14mm/px · 2 of 2 slices shown]
[im 1/2]
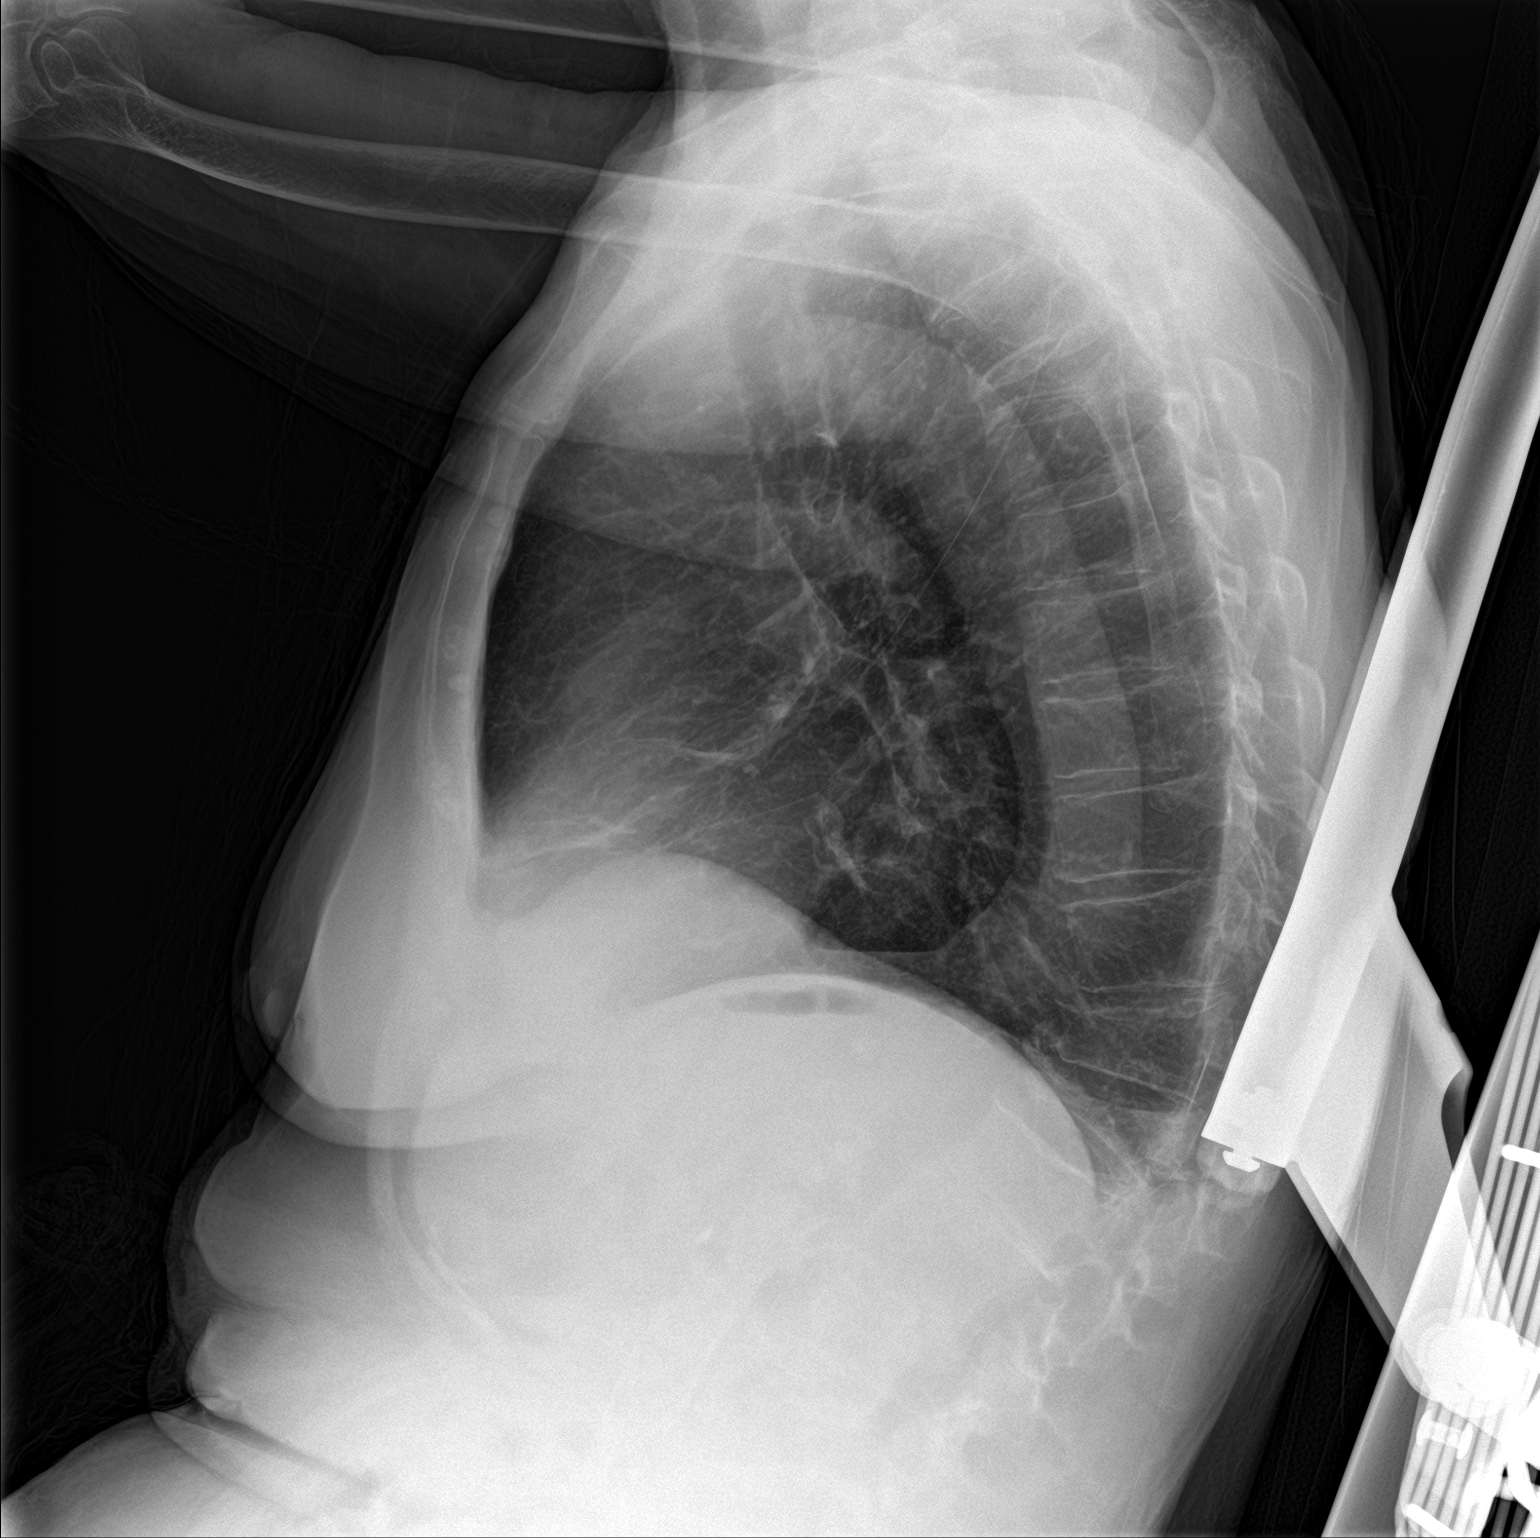
[im 2/2]
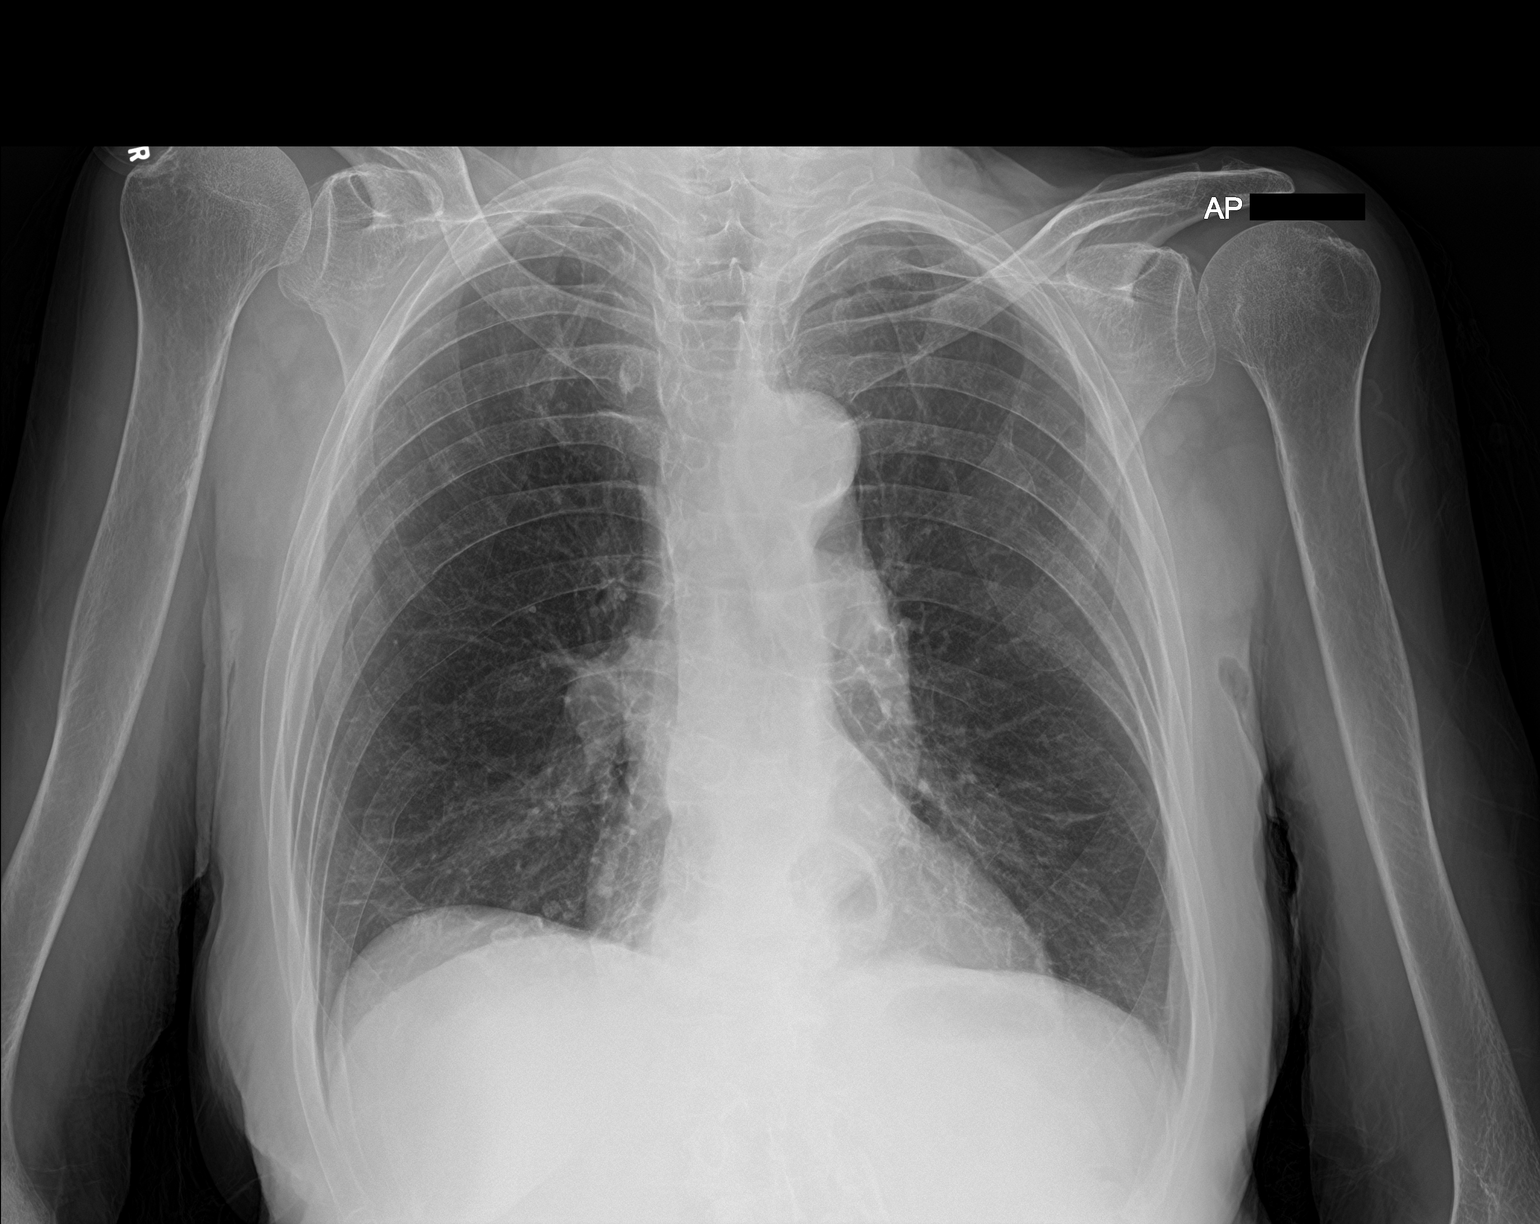

[2 of 2 positions shown; findings below may reference images not displayed]

FINDINGS: There is an acute fracture of the right fourth rib anterolaterally.
Several remote right rib fractures are also noted. The lungs are
clear. No pneumothorax. Tiny right pleural effusion is present.
Small hiatal hernia is noted. Cardiac size within normal limits. The
pulmonary vascularity is normal. There is progressive loss of height
involving the T12 compression fracture with new anterior wedge
compression fracture of probable T11 since prior examination, though
exact localization is difficult due to overlying metallic artifact.
IMPRESSION: Acute right fourth rib anterolateral fracture.  No pneumothorax.

Multiple thoracolumbar compression fractures, new and progressive
since prior examination, age indeterminate. These could be better
assessed with MRI examination, particularly if percutaneous
vertebral stabilization is considered as a therapeutic option.

## 2019-11-29 IMAGING — CT CT CHEST W/O CM
2 of 3 series · 14 of 36 positions shown, 17 images · non-contrast
Comparison: Radiograph [DATE], CT [DATE]

CLINICAL DATA: Syncopal episode and fall to right side, pain and
right ribs

EXAM:
CT CHEST WITHOUT CONTRAST
TECHNIQUE: Multidetector CT imaging of the chest was performed following the
standard protocol without IV contrast.

[Series 2: thorax · axial · 0.63mm/px · z∈[-168,+30]mm · 11 of 117 slices shown, 14 images]
[im 9/117  mediastinal]
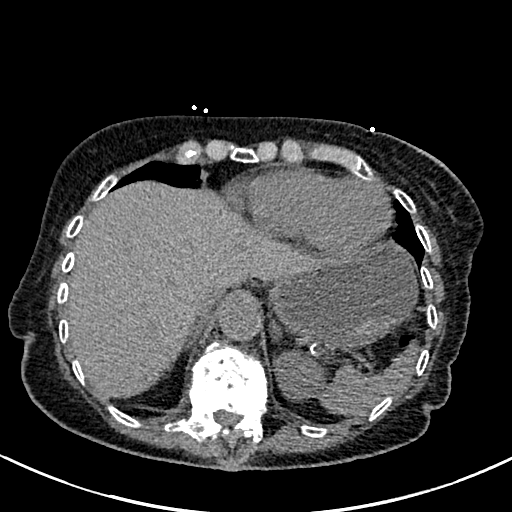
[im 9/117  lung]
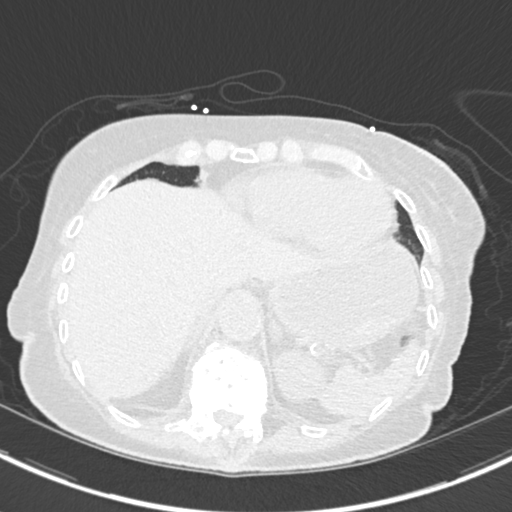
[im 18/117  lung]
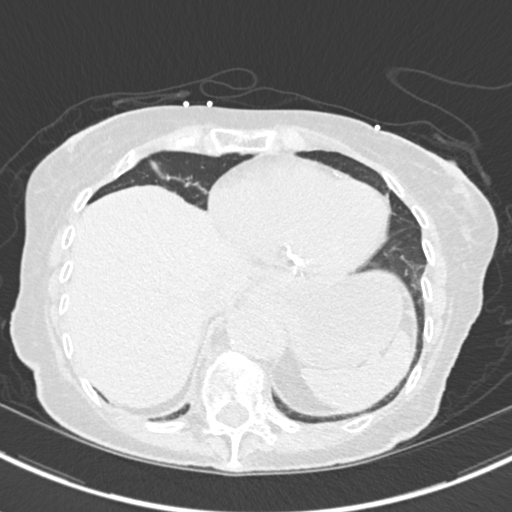
[im 26/117  lung]
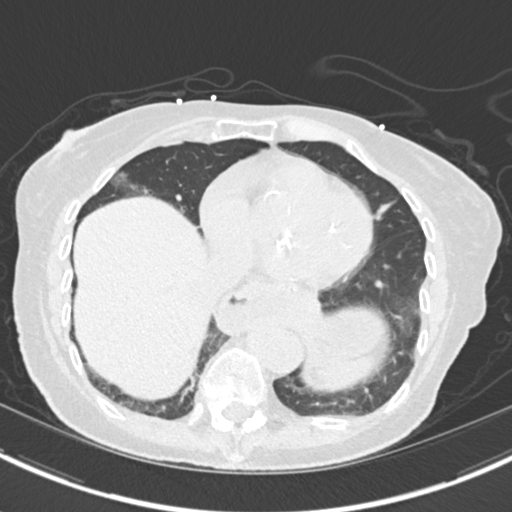
[im 39/117  lung]
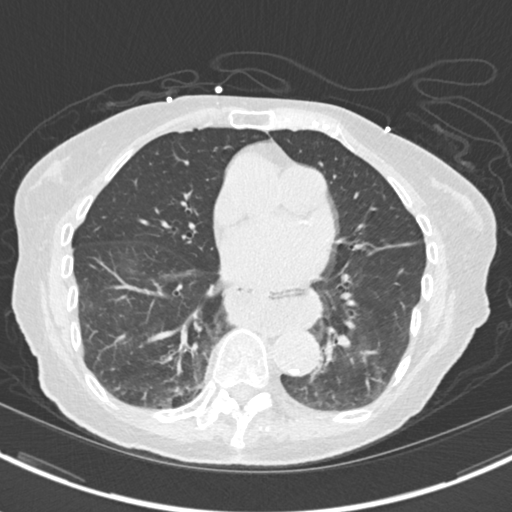
[im 48/117  mediastinal]
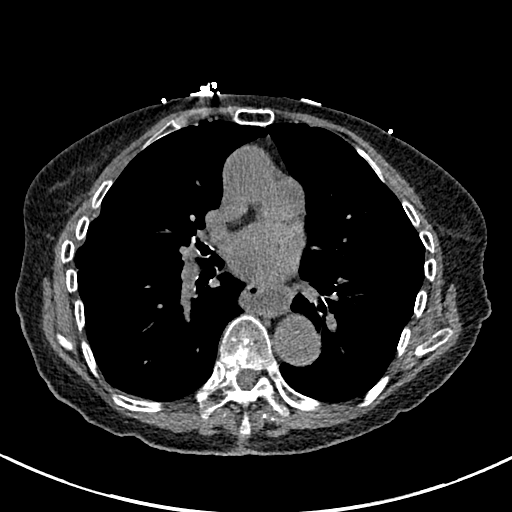
[im 48/117  lung]
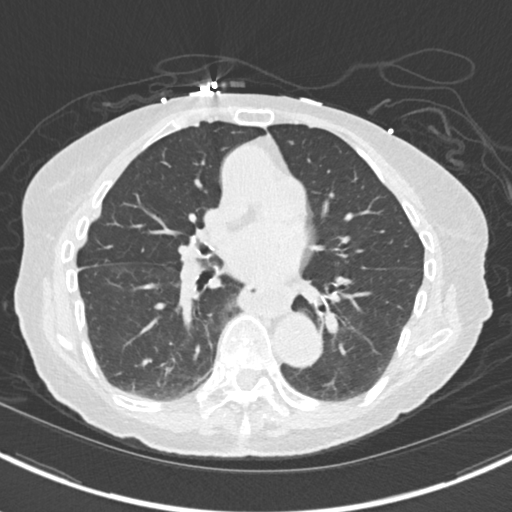
[im 61/117  lung]
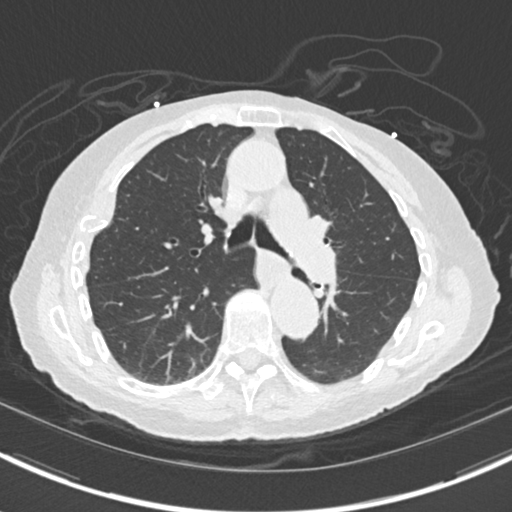
[im 69/117  lung]
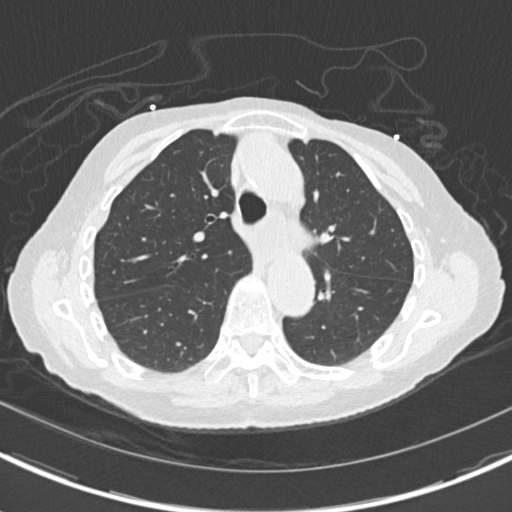
[im 78/117  lung]
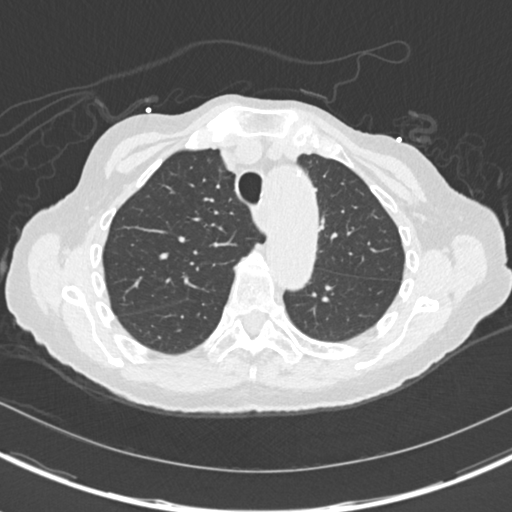
[im 91/117  mediastinal]
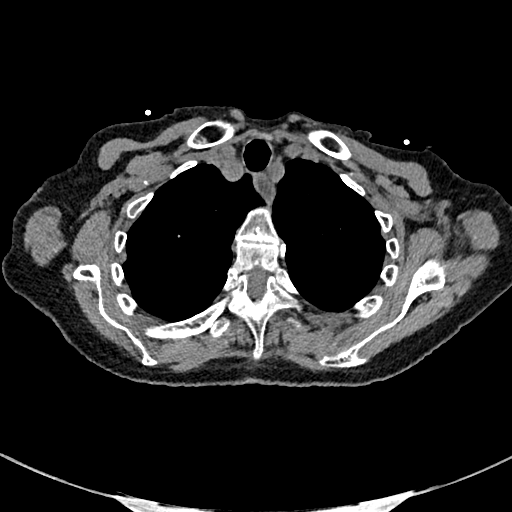
[im 91/117  lung]
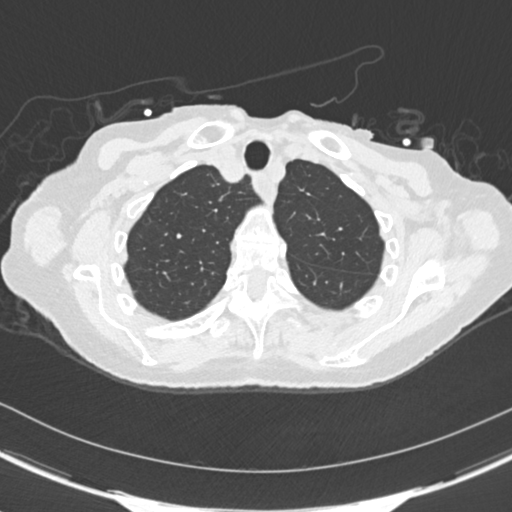
[im 99/117  lung]
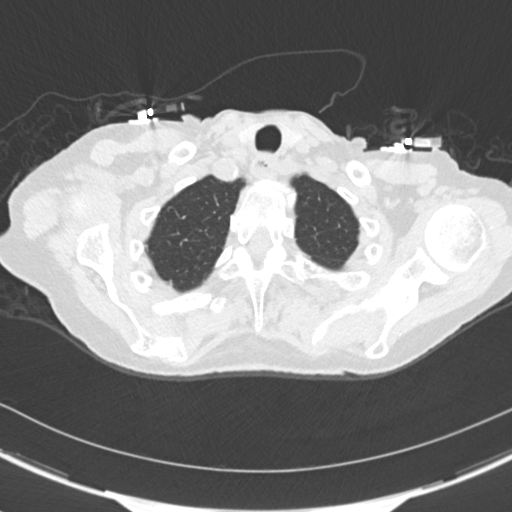
[im 108/117  lung]
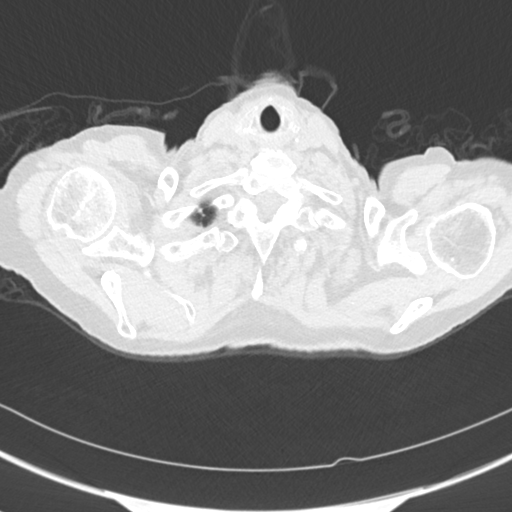

[Series 5: coronal · coronal · 0.55mm/px · 3 of 120 slices shown]
[im 24/120  lung]
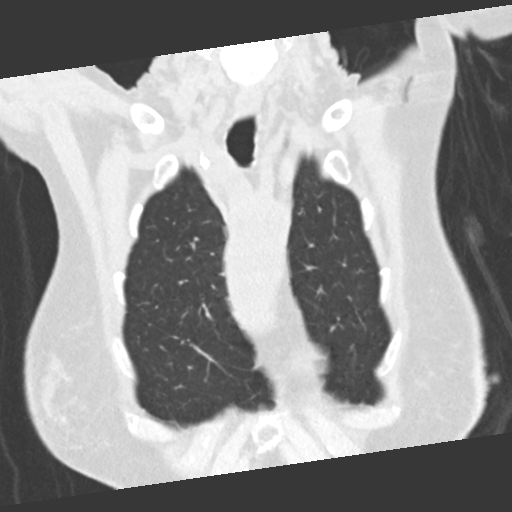
[im 48/120  lung]
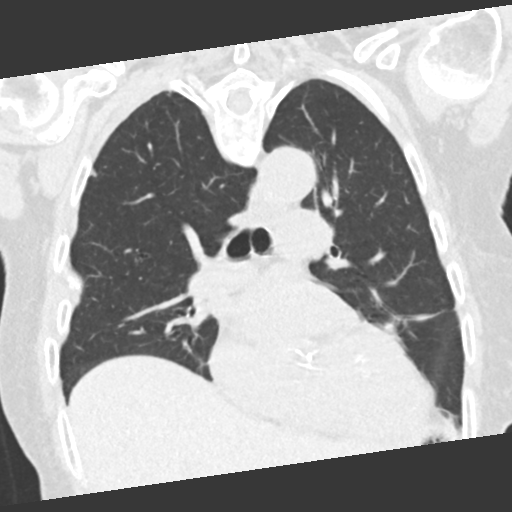
[im 72/120  lung]
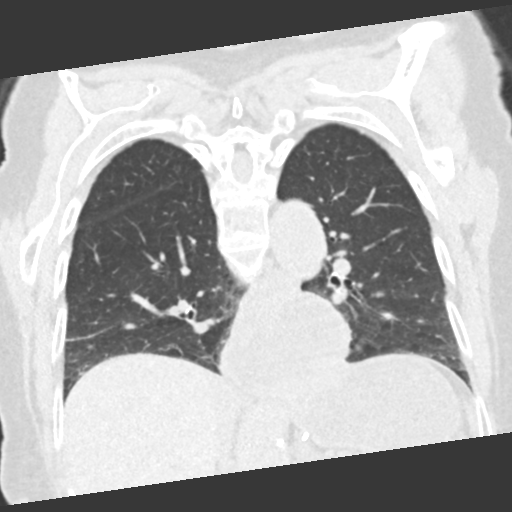

[14 of 36 positions shown; findings below may reference images not displayed]

FINDINGS: Cardiovascular: Normal heart size. No pericardial effusion.
Extensive coronary artery calcifications are present. Calcifications
are present upon the mitral annulus, chordae tendinae, and aortic
leaflets. Atherosclerotic plaque within the normal caliber aorta. No
abnormal hyperdense mural thickening or plaque displacement to
suggest intramural hematoma. No periaortic stranding or hemorrhage.
Shared origin of brachiocephalic and left common carotid artery.
Minimal plaque in the proximal great vessels and cervical carotids.
Central pulmonary arteries are normal caliber. Luminal evaluation of
the vasculature is precluded in the absence of contrast media.

Mediastinum/Nodes: Patulous, fluid-filled thoracic esophagus with
moderate hiatal hernia. No acute abnormality of the trachea. No
mediastinal or axillary adenopathy. Hilar nodal evaluation limited
in the absence of contrast media. No mediastinal fluid, hemorrhage
or gas. Thyroid gland and thoracic inlet are unremarkable.

Lungs/Pleura: Mild subpleural thickening noted adjacent the
contiguous right-sided rib fractures. No convincing parenchymal
contusion, laceration nor effusion or pneumothorax. Atelectatic
changes present in the lungs likely exacerbated by splinting. No
consolidation or convincing features of edema. Mild bronchiectatic
changes in the medial lung base are new from prior given the
esophageal findings could reflect sequela of prior aspiration or
bronchitic change.

Upper Abdomen: No acute abnormalities present in the visualized
portions of the upper abdomen. Extensive atherosclerotic
calcification of the upper abdomen.

Musculoskeletal: Displaced and mildly comminuted right fourth and
fifth rib fractures with additional nondisplaced sixth and seventh
acute rib fractures as well. Additional remote anterolateral eighth
ninth and posterolateral tenth rib fractures are noted as well.
There is a lucency extending through the anterior cortex and
endplates of the L1 vertebral body with progressive height loss and
increasing sclerosis from comparison CT now with up to 85% height
loss anteriorly and some mild posterior height loss and retropulsion
of the superior endplate resulting in moderate to severe canal
stenosis. Progressive focal kyphotic curvature at this level.
Additional new subacute to chronic appearing anterior compression
deformity at T12 with up to 30% height loss anteriorly. Stable
compression deformities elsewhere in the spine at T5, T3 and T2.
Findings on a background of diffuse multilevel discogenic and facet
degenerative changes.
IMPRESSION: 1. Displaced and mildly comminuted right fourth and fifth rib
fractures with additional nondisplaced sixth and seventh acute rib
fractures as well. Mild subpleural thickening adjacent the
contiguous right-sided rib fractures. No convincing parenchymal
contusion, laceration or effusion or pneumothorax.
2. Additional remote anterolateral eighth ninth and posterolateral
tenth rib fractures are noted as well.
3. L1 vertebral body fracture with progressive height loss and
increasing sclerosis from comparison CT with a possibly acute on
subacute fracture lucency with up to 85% height loss anteriorly and
some mild posterior height loss and retropulsion of the superior
endplate resulting in moderate to severe canal stenosis. Correlate
for point tenderness.
4. Additional subacute to chronic appearing anterior compression
deformity at T12 with up to 30% height loss anteriorly.
5. Stable compression deformities elsewhere in the spine at T5, T3
and T2.
6. Patulous, fluid-filled thoracic esophagus with moderate hiatal
hernia.
7. Mild bronchiectatic changes in the medial lung bases are new from
prior given the esophageal findings could reflect sequela of prior
aspiration. Correlate for aspiration risk.
8. Extensive coronary artery calcifications.
9. Aortic Atherosclerosis ([NV]-[NV]).

## 2019-11-29 IMAGING — CT CT HEAD W/O CM
3 series · 15 of 46 positions shown, 18 images · non-contrast
Comparison: [DATE]

CLINICAL DATA: Syncope, loss of consciousness, fell on right side

EXAM:
CT HEAD WITHOUT CONTRAST
TECHNIQUE: Contiguous axial images were obtained from the base of the skull
through the vertex without intravenous contrast.

[Series 3: head wo · axial · 0.41mm/px · z∈[+192,+312]mm · 9 of 29 slices shown, 12 images]
[im 3/29  brain]
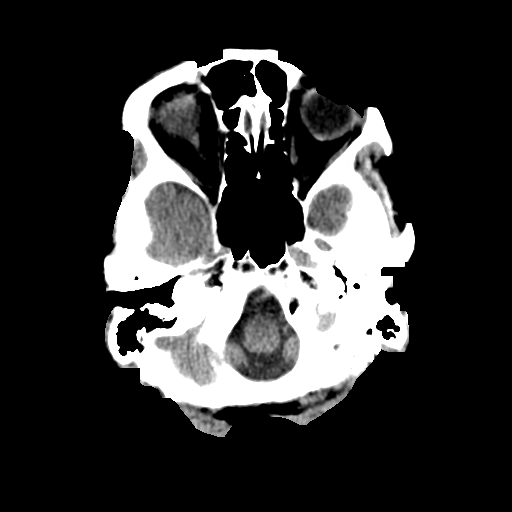
[im 3/29  bone]
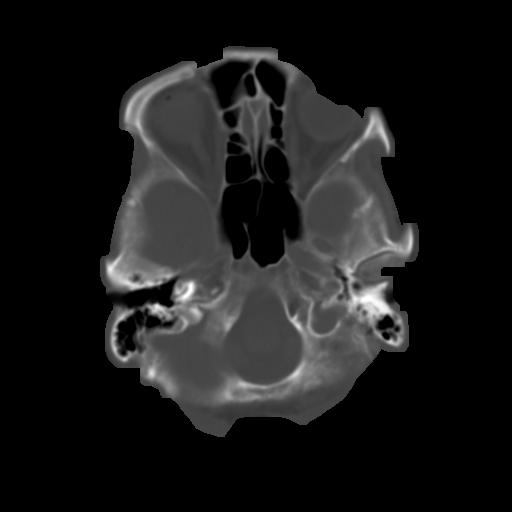
[im 6/29  brain]
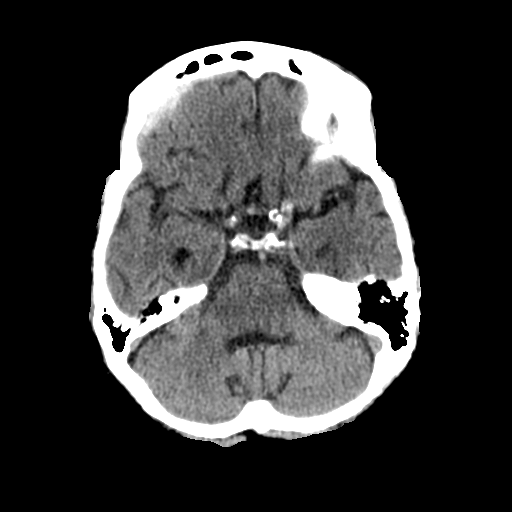
[im 9/29  brain]
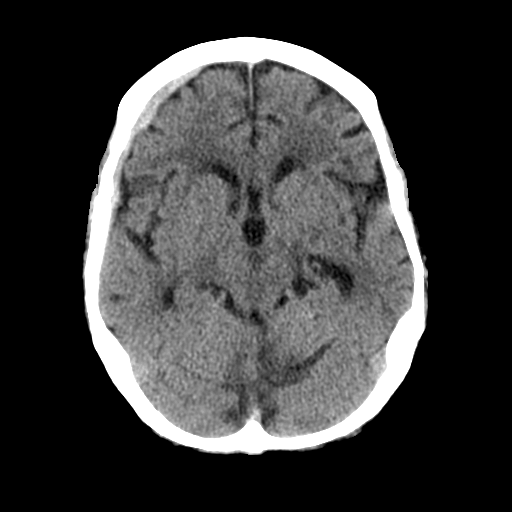
[im 12/29  brain]
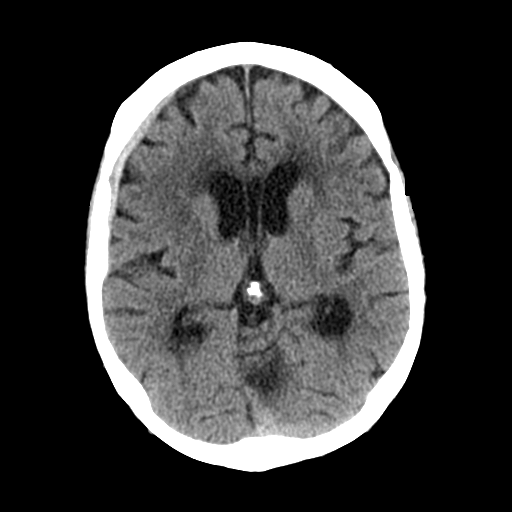
[im 15/29  brain]
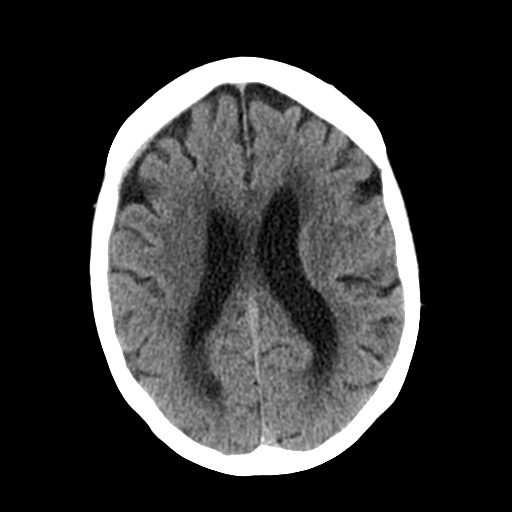
[im 15/29  bone]
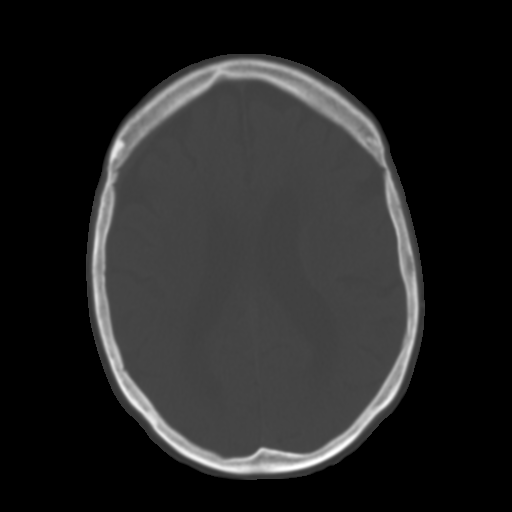
[im 18/29  brain]
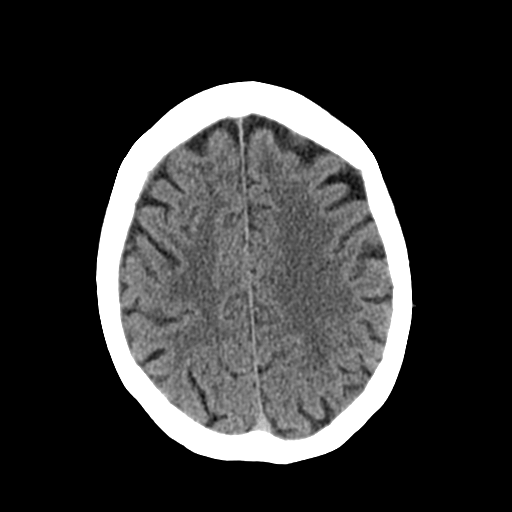
[im 21/29  brain]
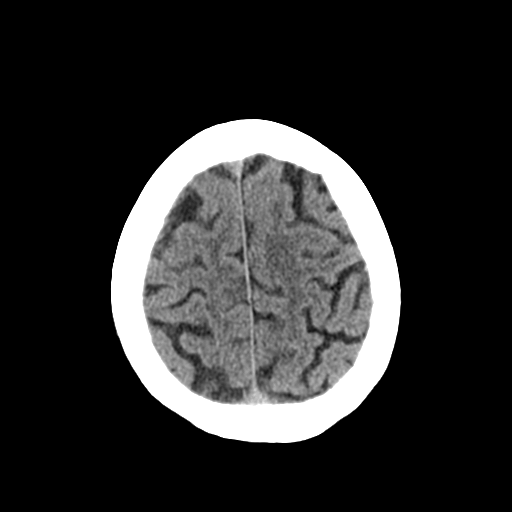
[im 24/29  brain]
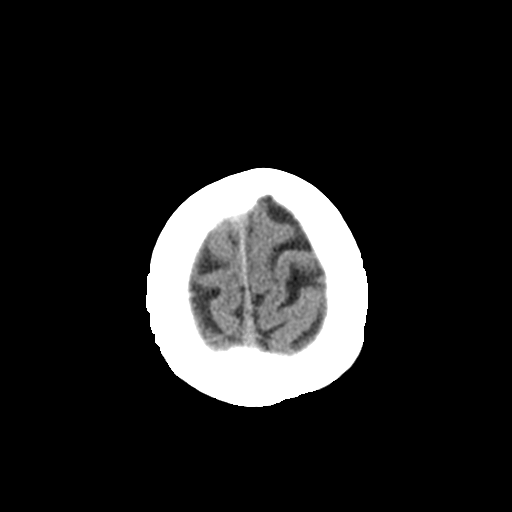
[im 27/29  brain]
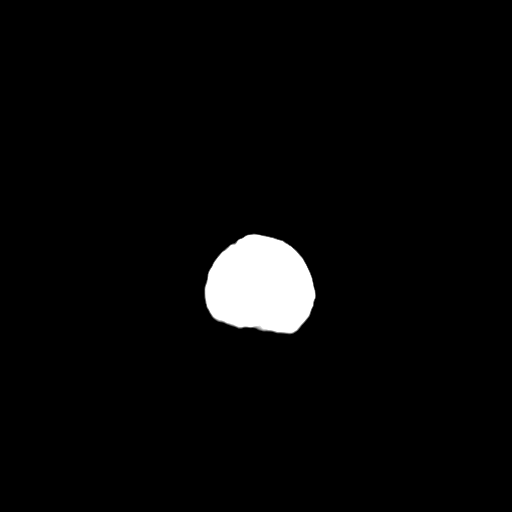
[im 27/29  bone]
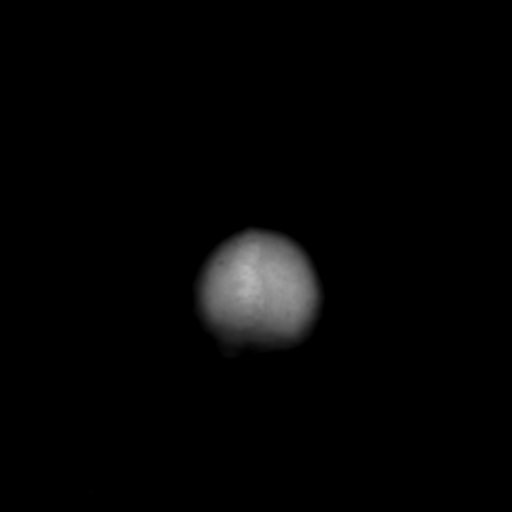

[Series 4: coronal soft tissue · coronal · 0.29mm/px · 3 of 59 slices shown]
[im 20/59  brain]
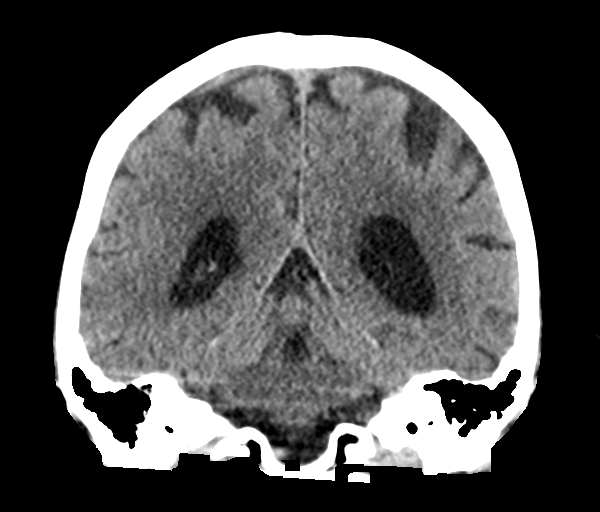
[im 26/59  brain]
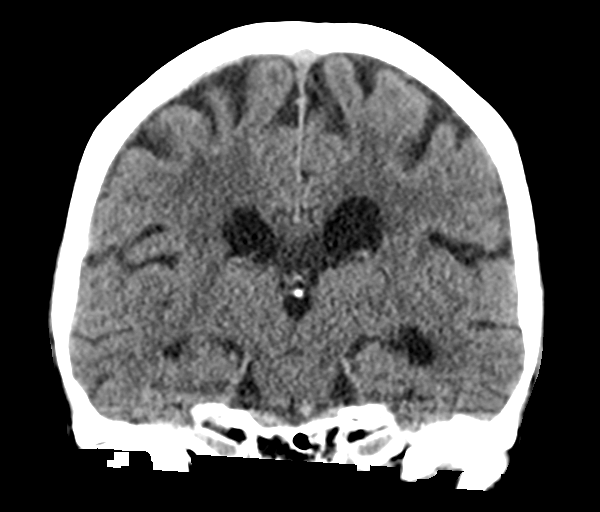
[im 33/59  brain]
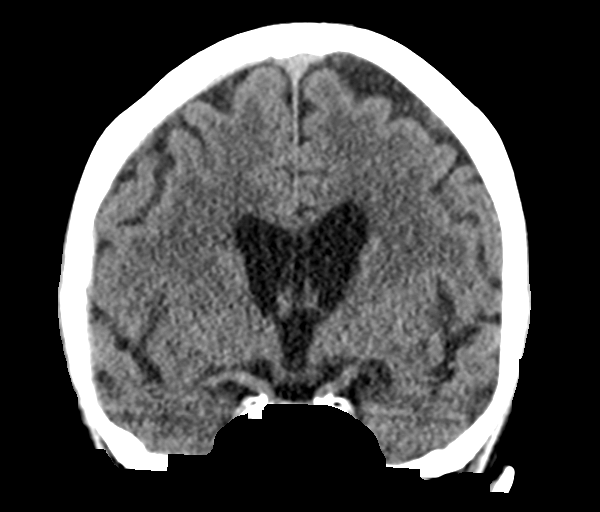

[Series 5: sagittal soft tissue · sagittal · 0.30mm/px · 3 of 48 slices shown]
[im 16/48  brain]
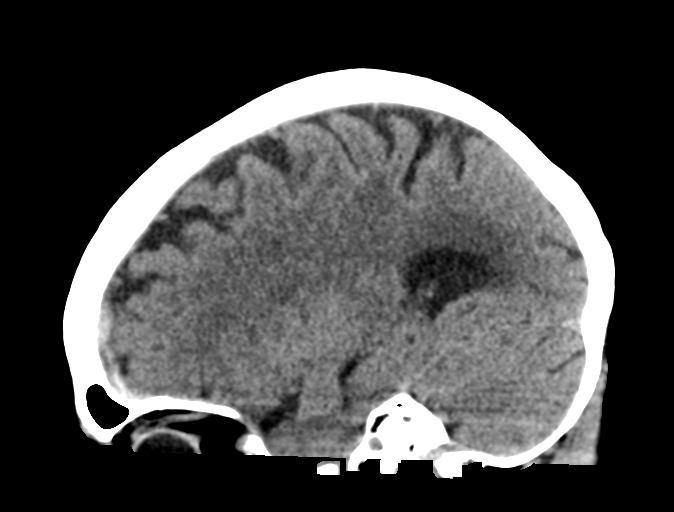
[im 24/48  brain]
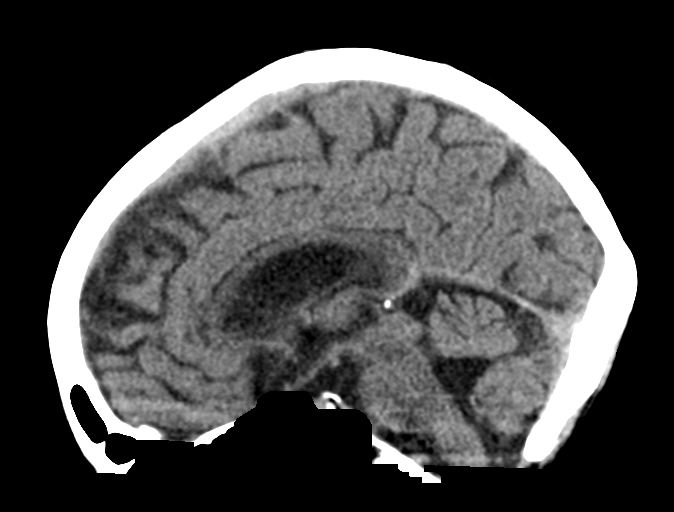
[im 32/48  brain]
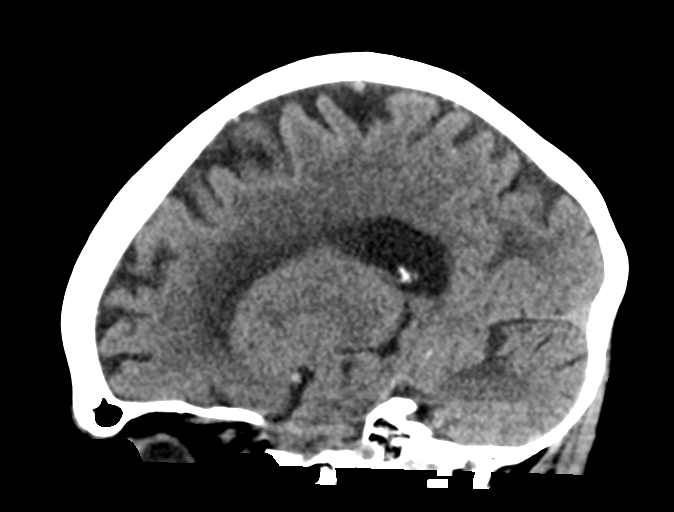

[15 of 46 positions shown; findings below may reference images not displayed]

FINDINGS: Brain: There is a small right frontal subdural hematoma measuring up
to 5 mm in thickness. There is no significant mass effect or midline
shift. No acute infarct. Lateral ventricles and midline structures
are stable.

Vascular: No hyperdense vessel or unexpected calcification.

Skull: Normal. Negative for fracture or focal lesion.

Sinuses/Orbits: No acute finding.

Other: None.
IMPRESSION: 1. Small right frontal subdural hematoma, measuring up to 5 mm in
thickness. No significant mass effect or midline shift.

These results were called by telephone at the time of interpretation
on [DATE] at [DATE] to provider JOSHJAX , who verbally
acknowledged these results.

## 2019-11-29 MED ORDER — LABETALOL HCL 5 MG/ML IV SOLN
10.0000 mg | Freq: Once | INTRAVENOUS | Status: AC
  Administered 2019-11-29: 10 mg via INTRAVENOUS
  Filled 2019-11-29: qty 4

## 2019-11-29 MED ORDER — MORPHINE SULFATE (PF) 2 MG/ML IV SOLN: 1.0000 mg | INTRAVENOUS | Status: DC | PRN

## 2019-11-29 NOTE — ED Notes (Signed)
Pt had fall at 1900 this evening at home and when husband came to help her pt had episode of aphasia which husband stated lasting approx 30 min. Pt stated she was fine all day up until fall. Pt has hx of stroke in Jan of this year and pt has hx of seizure in 1997. Pt AxO x4 at bedside at this time.

## 2019-11-29 NOTE — H&P (Signed)
Tanya Knight PJA:[id number] DOB: 01-28-1941 DOA: 11/29/2019     PCP: Venia Carbon, MD   Outpatient Specialists:     NEurology  Dr. Frann Rider, NP   Patient arrived to ER on 11/29/19 at 2059 Referred by Attending Harvest Dark, MD   Patient coming from: home Lives  With family    Chief Complaint:   Chief Complaint  Patient presents with  . Loss of Consciousness  . Aphasia    HPI: Tanya Knight is a 79 y.o. female with medical history significant of CVA in 2021 January, seizure DO on depakote, GERD, hypertension, osteoporosis    Presented with   syncopal episode that happened today around 7 PM patient family noticed that she lost consciousness and fell to her right side when she woke up she reported pain in her head and ribs on the right she has had history of seizures in the past but was not witnessed to have any seizure activity today.  Patient has been taking her medications. After a syncopal event she did have a hard time speaking lasting between 20 to 30 minutes.  She had difficulty finding words. This was unwitnessed fall, pt states felt similar to prior seizure.  No chest pain, no SOb, no fever At the time of ER evaluation patient back to baseline.  No neurological deficits. Patient not on blood thinners but does take aspirin.  She did have head injury Of note in January patient had a very similar presentation when she had a sudden onset of headache nausea vomiting slurred speech and weakness resulting in the fall.  Work-up showed cerebellar vermis ICH she of SIADH and SAH likely secondary to hypertension.  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 MRI showed stable hematoma no hydrocephalus She had 2D echo done that showed preserved EF and no cardiogenic source of emboli  Infectious risk factors:  Reports none    Has  Been fully vaccinated against COVID    Initial COVID TEST    NEGATIVE   Lab Results  Component Value Date   [place] NEGATIVE 11/29/2019   [place] NEGATIVE 06/10/2019     Regarding pertinent Chronic problems:     Hyperlipidemia -  Not on statins Lipid Panel     Component Value Date/Time   CHOL 219 (H) 06/11/2019 0241   TRIG 73 06/11/2019 0241   HDL 84 06/11/2019 0241   CHOLHDL 2.6 06/11/2019 0241   VLDL 15 06/11/2019 0241   LDLCALC 120 (H) 06/11/2019 0241     HTN on Norvasc, Toprol  Seizure disorder on Depakote      Hx of CVA -  With  residual deficits on Aspirin 81 mg     While in ER: Right frontal subdural 5 mm no shift CXR - right side FRX CT subacute compression frx Depakote level 52 Will need neurology consult   ER Provider Called:   Neurosurgery  Dr.Cook They Recommend admit to medicine  No transfer to [facility], repeat CT in 4h If got worse will need to go to [facility] Hold aspirin for 7 days   Hospitalist was called for admission for syncope  The following Work up has been ordered so far:  Orders Placed This Encounter  Procedures  . SARS Coronavirus 2 by RT PCR (hospital order, performed in [facility] hospital lab) Nasopharyngeal Nasopharyngeal Swab  . CT HEAD WO CONTRAST  . DG Chest 2 View  .  CT Chest Wo Contrast  . Protime-INR  . APTT  . CBC  . Differential  . Comprehensive metabolic panel  . Valproic acid level  . Diet NPO time specified  . Cardiac monitoring  . Consult to hospitalist  ALL PATIENTS BEING ADMITTED/HAVING PROCEDURES NEED COVID-19 SCREENING  . Pulse oximetry, continuous  . EKG 12-Lead  . ED EKG    Following Medications were ordered in ER: Medications  labetalol (NORMODYNE) injection 10 mg (10 mg Intravenous Given 11/29/19 2236)        Consult Orders  (From admission, onward)         Start     Ordered   11/29/19 2247  Consult to hospitalist  ALL PATIENTS BEING ADMITTED/HAVING PROCEDURES NEED COVID-19 SCREENING  Once       Comments: ALL PATIENTS BEING ADMITTED/HAVING PROCEDURES  NEED COVID-19 SCREENING  Provider:  (Not yet assigned)  Question Answer Comment  Place call to: [facility]   Reason for Consult Admit   Diagnosis/Clinical Info for Consult: Fall, rib fractures, subdural hematoma      11/29/19 2247           Significant initial  Findings: Abnormal Labs Reviewed  APTT - Abnormal; Notable for the following components:      Result Value   aPTT 44 (*)    All other components within normal limits  COMPREHENSIVE METABOLIC PANEL - Abnormal; Notable for the following components:   Sodium 133 (*)    Chloride 94 (*)    Glucose, Bld 124 (*)    All other components within normal limits  URINALYSIS, COMPLETE (UACMP) WITH MICROSCOPIC - Abnormal; Notable for the following components:   Color, Urine YELLOW (*)    APPearance CLOUDY (*)    Ketones, ur 5 (*)    All other components within normal limits    Otherwise labs showing:    Recent Labs  Lab 11/29/19 2115  NA 133*  K 3.9  CO2 30  GLUCOSE 124*  BUN 20  CREATININE 0.67  CALCIUM 9.7    Cr   stable,    Lab Results  Component Value Date   CREATININE 0.67 11/29/2019   CREATININE 0.74 07/11/2019   CREATININE 0.79 07/04/2019    Recent Labs  Lab 11/29/19 2115  AST 21  ALT 13  ALKPHOS 69  BILITOT 0.7  PROT 7.1  ALBUMIN 4.2   Lab Results  Component Value Date   CALCIUM 9.7 11/29/2019   PHOS 4.7 (H) 06/24/2019     WBC      Component Value Date/Time   WBC 6.6 11/29/2019 2115   ANC    Component Value Date/Time   NEUTROABS 4.5 11/29/2019 2115   ALC No components found for: LYMPHAB    Plt: Lab Results  Component Value Date   PLT 246 11/29/2019     COVID-19 Labs  No results for input(s): DDIMER, FERRITIN, LDH, CRP in the last 72 hours.  Lab Results  Component Value Date   SARSCOV2NAA NEGATIVE 11/29/2019   [place] NEGATIVE 06/10/2019       HG/HCT  Stable,     Component Value Date/Time   HGB 14.7 11/29/2019 2115   HCT 43.6 11/29/2019 2115    No results for  input(s): LIPASE, AMYLASE in the last 168 hours. No results for input(s): AMMONIA in the last 168 hours.    Troponin 5     ECG: Ordered Personally reviewed by me showing: HR : 69 Rhythm:  NSR,   no evidence of ischemic  changes QTC 394     DM  labs:  HbA1C: Recent Labs    06/11/19 0241  HGBA1C 5.1       UA   no evidence of UTI      Urine analysis:    Component Value Date/Time   COLORURINE YELLOW (A) 11/29/2019 2334   APPEARANCEUR CLOUDY (A) 11/29/2019 2334   LABSPEC 1.013 11/29/2019 2334   PHURINE 7.0 11/29/2019 [place] 11/29/2019 2334   HGBUR NEGATIVE 11/29/2019 2334   BILIRUBINUR NEGATIVE 11/29/2019 2334   BILIRUBINUR [place] 11/07/2019 1412   KETONESUR 5 (A) 11/29/2019 2334   PROTEINUR NEGATIVE 11/29/2019 2334   UROBILINOGEN 0.2 11/07/2019 1412   NITRITE NEGATIVE 11/29/2019 2334   LEUKOCYTESUR NEGATIVE 11/29/2019 2334       Ordered  CT HEAD Small right frontal subdural hematoma, measuring up to 5 mm  CXR -  Acute right fourth rib anterolateral fracture     CT  chest -displaced mildly comminuted right fourth and fifth fractures 6 and 7 acute rib fractures more remote in the eighth and ninth posterior lateral 10th rib fractures L1 vertebral body fracture T12 subacute deformity and also compression fractures from T5 T3 and T2      ED Triage Vitals  Enc Vitals Group     BP 11/29/19 2104 (!) 186/98     Pulse Rate 11/29/19 2104 79     Resp 11/29/19 2104 16     Temp 11/29/19 2104 98.7 F (37.1 C)     Temp Source 11/29/19 2104 Oral     SpO2 11/29/19 2104 98 %     Weight 11/29/19 2106 113 lb (51.3 kg)     Height 11/29/19 2106 5\' 1"  (1.549 m)     Head Circumference --      Peak Flow --      Pain Score 11/29/19 2105 7     Pain Loc --      Pain Edu? --      Excl. in [place]? --   TMAX(24)@       Latest  Blood pressure (!) 186/98, pulse 79, temperature 98.7 F (37.1 C), temperature source Oral, resp. rate 16, height 5\' 1"  (1.549 m), weight  51.3 kg, SpO2 98 %.     Review of Systems:    Pertinent positives include: fall,  Confusion  neurological complaints,  Constitutional:  No weight loss, night sweats, Fevers, chills, fatigue, weight loss  HEENT:  No headaches, Difficulty swallowing,Tooth/dental problems,Sore throat,  No sneezing, itching, ear ache, nasal congestion, post nasal drip,  Cardio-vascular:  No chest pain, Orthopnea, PND, anasarca, dizziness, palpitations.no Bilateral lower extremity swelling  GI:  No heartburn, indigestion, abdominal pain, nausea, vomiting, diarrhea, change in bowel habits, loss of appetite, melena, blood in stool, hematemesis Resp:  no shortness of breath at rest. No dyspnea on exertion, No excess mucus, no productive cough, No non-productive cough, No coughing up of blood.No change in color of mucus.No wheezing. Skin:  no rash or lesions. No jaundice GU:  no dysuria, change in color of urine, no urgency or frequency. No straining to urinate.  No flank pain.  Musculoskeletal:  No joint pain or no joint swelling. No decreased range of motion. No back pain.  Psych:  No change in mood or affect. No depression or anxiety. No memory loss.  Neuro: no localizing no tingling, no weakness, no double vision, no gait abnormality, no slurred speech, no  All systems reviewed and apart from HOPI all are negative  Past Medical History:   Past Medical History:  Diagnosis Date  . Fracture of metatarsal    Repair of fractured R metatarsal --Dr Duda---4/08  . GERD (gastroesophageal reflux disease)   . Hypertension   . Melanoma in situ [facility]) 7/17   Dr Kellie Moor  . Osteoporosis   . Seizure disorder ([place])   . Seizures ([place])   . Stroke ([place]) 06/2019  . Vitamin D deficiency        Past Surgical History:  Procedure Laterality Date  . CATARACT EXTRACTION, BILATERAL Bilateral 2014  . EYE SURGERY     Obstructed tear duct  . FOOT SURGERY  2008  . MELANOMA EXCISION Left 11/2015   left lower leg    . TEAR DUCT PROBING  10/12   temporary stent  . TONSILLECTOMY AND ADENOIDECTOMY  1948    Social History:  Ambulatory walker     reports that she has never smoked. She has never used smokeless tobacco. She reports current alcohol use. She reports that she does not use drugs.   Family History:   Family History  Problem Relation Age of Onset  . Hypertension Mother   . Heart disease Father   . Breast cancer Daughter 57  . Diabetes Maternal Aunt   . Coronary artery disease Paternal Aunt   . Breast cancer Paternal Aunt   . Coronary artery disease Paternal Uncle   . Heart disease Maternal Grandmother   . Heart disease Maternal Grandfather   . Heart disease Paternal Grandmother   . Heart disease Paternal Grandfather   . Colon cancer Neg Hx   . Stomach cancer Neg Hx   . Pancreatic cancer Neg Hx   . Rectal cancer Neg Hx     Allergies: Allergies  Allergen Reactions  . Phenytoin     Other reaction(s): Other (See Comments) Other Reaction: Other reaction REACTION: severe reaction  . Septra [Sulfamethoxazole-Trimethoprim]     Nausea, trouble eating and drinking     Prior to Admission medications   Medication Sig Start Date End Date Taking? Authorizing Provider  amLODipine (NORVASC) 2.5 MG tablet Take 3 tablets (7.5 mg total) by mouth daily. 11/07/19   Venia Carbon, MD  aspirin 81 MG EC tablet Take 81 mg by mouth daily. Swallow whole.    [provider]  cholecalciferol (VITAMIN D) 1000 units tablet Take 1 tablet (1,000 Units total) by mouth daily. 07/12/19   Angiulli, Lavon Paganini, PA-C  divalproex (DEPAKOTE) 500 MG DR tablet Take 1 tablet (500 mg total) by mouth 2 (two) times daily. 11/07/19   Venia Carbon, MD  metoprolol succinate (TOPROL-XL) 50 MG 24 hr tablet Take 1 tablet (50 mg total) by mouth daily. Take with or immediately following a meal. 11/07/19   Venia Carbon, MD  Multiple Vitamin (MULTIVITAMIN) capsule Take 1 capsule by mouth daily.      [provider]   Physical Exam: Vitals with BMI 11/29/2019 11/07/2019 10/20/2019  Height 5\' 1"  - 5\' 1"   Weight 113 lbs 113 lbs 12 oz 112 lbs 13 oz  BMI 21.36 53.6 14.43  Systolic 154 008 676  Diastolic 98 72 86  Pulse 79 67 65     1. General:  in No Acute distress   Chronically ill  -appearing 2. Psychological: Alert and  Oriented 3. Head/ENT:     Dry Mucous Membranes  Head  traumatic, neck supple                           Poor Dentition 4. SKIN: decreased Skin turgor,  Skin clean Dry and intact no rash 5. Heart: Regular rate and rhythm no  Murmur, no Rub or gallop 6. Lungs:   no wheezes or crackles   7. Abdomen: Soft,  non-tender, Non distended   obese  bowel sounds present 8. Lower extremities: no clubbing, cyanosis, no  edema 9. Neurologically  strength 5 out of 5 in all 4 extremities cranial nerves II through XII intact 10. MSK: Normal range of motion   All other LABS:     Recent Labs  Lab 11/29/19 2115  WBC 6.6  NEUTROABS 4.5  HGB 14.7  HCT 43.6  MCV 92.4  PLT 246     Recent Labs  Lab 11/29/19 2115  NA 133*  K 3.9  CL 94*  CO2 30  GLUCOSE 124*  BUN 20  CREATININE 0.67  CALCIUM 9.7     Recent Labs  Lab 11/29/19 2115  AST 21  ALT 13  ALKPHOS 69  BILITOT 0.7  PROT 7.1  ALBUMIN 4.2       Cultures: No results found for: SDES, SPECREQUEST, CULT, REPTSTATUS   Radiological Exams on Admission: DG Chest 2 View  Result Date: 11/29/2019 CLINICAL DATA:  Rib pain, fall, EXAM: CHEST - 2 VIEW COMPARISON:  06/15/2019 FINDINGS: There is an acute fracture of the right fourth rib anterolaterally. Several remote right rib fractures are also noted. The lungs are clear. No pneumothorax. Tiny right pleural effusion is present. Small hiatal hernia is noted. Cardiac size within normal limits. The pulmonary vascularity is normal. There is progressive loss of height involving the T12 compression fracture with new anterior wedge compression  fracture of probable T11 since prior examination, though exact localization is difficult due to overlying metallic artifact. IMPRESSION: Acute right fourth rib anterolateral fracture.  No pneumothorax. Multiple thoracolumbar compression fractures, new and progressive since prior examination, age indeterminate. These could be better assessed with MRI examination, particularly if percutaneous vertebral stabilization is considered as a therapeutic option. Electronically Signed   By: Fidela Salisbury MD   On: 11/29/2019 21:34   CT HEAD WO CONTRAST  Result Date: 11/29/2019 CLINICAL DATA:  Syncope, loss of consciousness, fell on right side EXAM: CT HEAD WITHOUT CONTRAST TECHNIQUE: Contiguous axial images were obtained from the base of the skull through the vertex without intravenous contrast. COMPARISON:  06/15/2019 FINDINGS: Brain: There is a small right frontal subdural hematoma measuring up to 5 mm in thickness. There is no significant mass effect or midline shift. No acute infarct. Lateral ventricles and midline structures are stable. Vascular: No hyperdense vessel or unexpected calcification. Skull: Normal. Negative for fracture or focal lesion. Sinuses/Orbits: No acute finding. Other: None. IMPRESSION: 1. Small right frontal subdural hematoma, measuring up to 5 mm in thickness. No significant mass effect or midline shift. These results were called by telephone at the time of interpretation on 11/29/2019 at 9:37 pm to provider [facility] , who verbally acknowledged these results. Electronically Signed   By: Randa Ngo M.D.   On: 11/29/2019 21:37   CT Chest Wo Contrast  Result Date: 11/29/2019 CLINICAL DATA:  Syncopal episode and fall to right side, pain and right ribs EXAM: CT CHEST WITHOUT CONTRAST TECHNIQUE: Multidetector CT imaging of the chest was performed following the standard protocol without IV contrast.  COMPARISON:  Radiograph 11/29/2019, CT 01/05/2015 FINDINGS: Cardiovascular: Normal heart  size. No pericardial effusion. Extensive coronary artery calcifications are present. Calcifications are present upon the mitral annulus, chordae tendinae, and aortic leaflets. Atherosclerotic plaque within the normal caliber aorta. No abnormal hyperdense mural thickening or plaque displacement to suggest intramural hematoma. No periaortic stranding or hemorrhage. Shared origin of brachiocephalic and left common carotid artery. Minimal plaque in the proximal great vessels and cervical carotids. Central pulmonary arteries are normal caliber. Luminal evaluation of the vasculature is precluded in the absence of contrast media. Mediastinum/Nodes: Patulous, fluid-filled thoracic esophagus with moderate hiatal hernia. No acute abnormality of the trachea. No mediastinal or axillary adenopathy. Hilar nodal evaluation limited in the absence of contrast media. No mediastinal fluid, hemorrhage or gas. Thyroid gland and thoracic inlet are unremarkable. Lungs/Pleura: Mild subpleural thickening noted adjacent the contiguous right-sided rib fractures. No convincing parenchymal contusion, laceration nor effusion or pneumothorax. Atelectatic changes present in the lungs likely exacerbated by splinting. No consolidation or convincing features of edema. Mild bronchiectatic changes in the medial lung base are new from prior given the esophageal findings could reflect sequela of prior aspiration or bronchitic change. Upper Abdomen: No acute abnormalities present in the visualized portions of the upper abdomen. Extensive atherosclerotic calcification of the upper abdomen. Musculoskeletal: Displaced and mildly comminuted right fourth and fifth rib fractures with additional nondisplaced sixth and seventh acute rib fractures as well. Additional remote anterolateral eighth ninth and posterolateral tenth rib fractures are noted as well. There is a lucency extending through the anterior cortex and endplates of the L1 vertebral body with  progressive height loss and increasing sclerosis from comparison CT now with up to 85% height loss anteriorly and some mild posterior height loss and retropulsion of the superior endplate resulting in moderate to severe canal stenosis. Progressive focal kyphotic curvature at this level. Additional new subacute to chronic appearing anterior compression deformity at T12 with up to 30% height loss anteriorly. Stable compression deformities elsewhere in the spine at T5, T3 and T2. Findings on a background of diffuse multilevel discogenic and facet degenerative changes. IMPRESSION: 1. Displaced and mildly comminuted right fourth and fifth rib fractures with additional nondisplaced sixth and seventh acute rib fractures as well. Mild subpleural thickening adjacent the contiguous right-sided rib fractures. No convincing parenchymal contusion, laceration or effusion or pneumothorax. 2. Additional remote anterolateral eighth ninth and posterolateral tenth rib fractures are noted as well. 3. L1 vertebral body fracture with progressive height loss and increasing sclerosis from comparison CT with a possibly acute on subacute fracture lucency with up to 85% height loss anteriorly and some mild posterior height loss and retropulsion of the superior endplate resulting in moderate to severe canal stenosis. Correlate for point tenderness. 4. Additional subacute to chronic appearing anterior compression deformity at T12 with up to 30% height loss anteriorly. 5. Stable compression deformities elsewhere in the spine at T5, T3 and T2. 6. Patulous, fluid-filled thoracic esophagus with moderate hiatal hernia. 7. Mild bronchiectatic changes in the medial lung bases are new from prior given the esophageal findings could reflect sequela of prior aspiration. Correlate for aspiration risk. 8. Extensive coronary artery calcifications. 9. Aortic Atherosclerosis (ICD10-I70.0). Electronically Signed   By: Lovena Le M.D.   On: 11/29/2019 22:34     Chart has been reviewed    Assessment/Plan     79 y.o. female with medical history significant of CVA in 2021 January, seizure DO on depakote, GERD, hypertension, osteoporosis     Admitted for  syncope/ word finding difficulty and subdural hematoma  Present on Admission: . Subdural hematoma ([place]) -admit to progressive care.  Frequent neurochecks.  CT head at 3 AM if evidence of worsening bleeding will need to transfer to [facility] otherwise continue to observe hold aspirin for 7 days  . Syncope and collapse -cycle cardiac enzymes obtain echogram and Dopplers.  Neurological versus cardiogenic event resulting in the fall. Order EEG  Seizure disorder continue Depakote for now.  Appreciate neurology consult in a.m.  It is unclear if the episode that led to her fall was a seizure EEG in AM  . Word finding difficulty -similar presentation last time patient did have stroke caused by intraparenchymal hemorrhage.  At this point CT is going to scan showing subdural.  No shift. Obtain MRI to rule out ischemic CVA as a result of the initial findings and fall.  Appreciate neurology consult.  Obtain neurochecks.  Rib fracture -pain control and incentive spirometry monitor for any signs of pneumonia   Multiple thoracic fractures -compression fractures appear to be chronic patient not read ankle dorsi and any back pain. Patient with multiple falls and history of osteoporosis will need to follow-up as an outpatient  . Essential hypertension -allow permissive hypertension for tonight     . GERD -chronic continue home medications   . Hyperlipidemia -not on  statin   . Malnutrition of mild degree ([place]) - we will check prealbumin in order nutritional consult  Other plan as per orders.  DVT prophylaxis:  SCD       Code Status:    Code Status: Prior FULL CODE  as per patient    I had personally discussed CODE STATUS with patient    Family Communication:   Family  at  Bedside  plan of care  was discussed  with  Husband   Disposition Plan:    To home once workup is complete and patient is stable   Following barriers for discharge:                                                        Will likely need home health,                            Will need consultants to evaluate patient prior to discharge                       Would benefit from PT/OT eval prior to DC  Ordered                   Swallow eval - SLP ordered                                     Transition of care consulted                                       Consults called:  Neurosurgery is aware, Send epic msg to Dr. Doy Mince with Neurology   Admission status:  ED Disposition    ED [facility]  Area: [place] [[id number]]  Level of Care: Progressive Cardiac [106]  Admit to Progressive based on following criteria: NEUROLOGICAL AND NEUROSURGICAL complex patients with significant risk of instability, who do not meet ICU criteria, yet require close observation or frequent assessment (< / = every 2 - 4 hours) with medical / nursing intervention.  Covid Evaluation: Confirmed COVID Negative  Diagnosis: Subdural hematoma [facility]) [[id number]]  Admitting Physician: Toy Baker [[id number]]  Attending Physician: Toy Baker [[id number]]       Obs     Level of care   progressive    Lab Results  Component Value Date   [place] 06/10/2019     Precautions: admitted as  Covid Negative     PPE: Used by the provider:   N95  eye Goggles,  Gloves     Lavonna Lampron 11/29/2019, 11:42 PM    [facility]     after 2 AM please page floor coverage PA If 7AM-7PM, please contact the day team taking care of the patient using [email or web address]   Patient was evaluated in the context of the global COVID-19 pandemic, which necessitated consideration that the patient might be at risk for infection with the SARS-CoV-2 virus that causes COVID-19.  Institutional protocols and algorithms that pertain to the evaluation of patients at risk for COVID-19 are in a state of rapid change based on information released by regulatory bodies including the CDC and federal and state organizations. These policies and algorithms were followed during the patient's care.

## 2019-11-29 NOTE — ED Notes (Signed)
Patient transported to CT 

## 2019-11-29 NOTE — ED Provider Notes (Addendum)
[facility] Emergency Department Provider Note  Time seen: 10:05 PM  I have reviewed the triage vital signs and the nursing notes.   HISTORY  Chief Complaint Loss of Consciousness and Aphasia   HPI Tanya Knight is a 79 y.o. female with a past medical history gastric reflux, hypertension, seizure disorder on Depakote presents to the emergency department after a fall.  According to the patient and 7 PM she was going to her bedroom She remembers is waking up on the ground.  Family emergency she was initially confused.  Patient states he has not had a seizure nearly 20 years.  Patient did fall to the ground.  Her only pain complaint is to her right lateral chest.  Patient denies any neck or back pain.  No headache.  No weakness or numbness.  Overall patient appears well answering questions appropriately and following commands well.   Past Medical History:  Diagnosis Date  . Fracture of metatarsal    Repair of fractured R metatarsal --Dr Duda---4/08  . GERD (gastroesophageal reflux disease)   . Hypertension   . Melanoma in situ [facility]) 7/17   Dr Kellie Moor  . Osteoporosis   . Seizure disorder ([place])   . Seizures ([place])   . Stroke ([place]) 06/2019  . Vitamin D deficiency     Patient Active Problem List   Diagnosis Date Noted  . Acute cystitis without hematuria 11/07/2019  . Malnutrition of mild degree ([place]) 10/20/2019  . Dysphagia, post-stroke   . Vascular headache   . Seizures ([place])   . Essential hypertension   . Hyperlipidemia 06/14/2019  . Cerebellar vermis ICH with SDH and SAH, likely hypertensive 06/10/2019  . Advance directive discussed with patient 09/08/2014  . Routine general medical examination at a health care facility 09/03/2011  . Essential hypertension, benign 10/12/2006  . GERD 10/12/2006  . Osteoporosis 10/12/2006  . Seizure disorder ([place]) 10/12/2006    Past Surgical History:  Procedure Laterality Date  . CATARACT EXTRACTION, BILATERAL  Bilateral 2014  . EYE SURGERY     Obstructed tear duct  . FOOT SURGERY  2008  . MELANOMA EXCISION Left 11/2015   left lower leg  . TEAR DUCT PROBING  10/12   temporary stent  . [place]    Prior to Admission medications   Medication Sig Start Date End Date Taking? Authorizing Provider  amLODipine (NORVASC) 2.5 MG tablet Take 3 tablets (7.5 mg total) by mouth daily. 11/07/19   Venia Carbon, MD  aspirin 81 MG EC tablet Take 81 mg by mouth daily. Swallow whole.    [provider]  cholecalciferol (VITAMIN D) 1000 units tablet Take 1 tablet (1,000 Units total) by mouth daily. 07/12/19   Angiulli, Lavon Paganini, PA-C  divalproex (DEPAKOTE) 500 MG DR tablet Take 1 tablet (500 mg total) by mouth 2 (two) times daily. 11/07/19   Venia Carbon, MD  metoprolol succinate (TOPROL-XL) 50 MG 24 hr tablet Take 1 tablet (50 mg total) by mouth daily. Take with or immediately following a meal. 11/07/19   Venia Carbon, MD  Multiple Vitamin (MULTIVITAMIN) capsule Take 1 capsule by mouth daily.      [provider]    Allergies  Allergen Reactions  . Phenytoin     Other reaction(s): Other (See Comments) Other Reaction: Other reaction REACTION: severe reaction  . Septra [Sulfamethoxazole-Trimethoprim]     Nausea, trouble eating and drinking    Family History  Problem Relation Age  of Onset  . Hypertension Mother   . Heart disease Father   . Breast cancer Daughter 68  . Diabetes Maternal Aunt   . Coronary artery disease Paternal Aunt   . Breast cancer Paternal Aunt   . Coronary artery disease Paternal Uncle   . Heart disease Maternal Grandmother   . Heart disease Maternal Grandfather   . Heart disease Paternal Grandmother   . Heart disease Paternal Grandfather   . Colon cancer Neg Hx   . Stomach cancer Neg Hx   . Pancreatic cancer Neg Hx   . Rectal cancer Neg Hx     Social History Social History   Tobacco Use  . Smoking status: Never  Smoker  . Smokeless tobacco: Never Used  Substance Use Topics  . Alcohol use: Yes    Alcohol/week: 0.0 standard drinks    Comment: occasional  . Drug use: No    Review of Systems Constitutional: Negative for fever Cardiovascular: Negative for chest pain. Respiratory: Negative for shortness of breath. Gastrointestinal: Negative for abdominal pain Musculoskeletal: Negative for musculoskeletal complaints Neurological: Negative for headache All other ROS negative  ____________________________________________   PHYSICAL EXAM:  VITAL SIGNS: ED Triage Vitals  Enc Vitals Group     BP 11/29/19 2104 (!) 186/98     Pulse Rate 11/29/19 2104 79     Resp 11/29/19 2104 16     Temp 11/29/19 2104 98.7 F (37.1 C)     Temp Source 11/29/19 2104 Oral     SpO2 11/29/19 2104 98 %     Weight 11/29/19 2106 113 lb (51.3 kg)     Height 11/29/19 2106 5\' 1"  (1.549 m)     Head Circumference --      Peak Flow --      Pain Score 11/29/19 2105 7     Pain Loc --      Pain Edu? --      Excl. in [place]? --     Constitutional: Alert and oriented. Well appearing and in no distress. Eyes: Normal exam ENT      Head: Normocephalic and atraumatic.      Mouth/Throat: Mucous membranes are moist. Cardiovascular: Normal rate, regular rhythm Respiratory: Normal respiratory effort without tachypnea nor retractions. Breath sounds are clear Gastrointestinal: Soft and nontender. No distention.   Musculoskeletal: Nontender with normal range of motion in all extremities.  Neurologic:  Normal speech and language. No gross focal neurologic deficits.  Equal grip strength bilaterally.  +5 motor in all extremities. Skin:  Skin is warm, dry and intact.  Psychiatric: Mood and affect are normal. S  ____________________________________________    EKG  EKG viewed and interpreted by myself shows a normal sinus rhythm at 69 bpm with a narrow QRS, normal axis, normal intervals, no concerning ST  changes.  ____________________________________________    RADIOLOGY  CT scan shows a 5 mm right frontal subdural Chest x-ray shows acute right fourth rib fracture.  Also shows multiple compression fractures although the patient has a completely nontender back with no back pain complaints.  ____________________________________________   INITIAL IMPRESSION / ASSESSMENT AND PLAN / ED COURSE  Pertinent labs & imaging results that were available during my care of the patient were reviewed by me and considered in my medical decision making (see chart for details).   Patient presents to the emergency department after a fall, found down by family.  Patient initially confused, but currently is alert oriented x4, no complaints besides right lateral rib pain.  Symptoms  are very suggestive of possible seizure.  Patient takes Depakote we will check a valproic acid level.  Patient CT scan does show a small right frontal I spoke to Dr. Lacinda Axon of neurosurgery who recommends repeating head CT at 6 hours.  If no interval increase patient can safely be cared for at [facility].  Given the x-ray findings of an acute right fourth rib fracture this does fit the patient's description of her right lateral chest pain we will obtain a CT scan of the chest without contrast further evaluate rule out pneumothorax or other additional rib fractures.  Chest x-ray does show multiple vertebral compression fractures.  Patient has completely nontender back.  Able to sit up without issues and no pain in the back elicited.  Suspect these are likely older compression fractures.  I spoke to the hospitalist regarding the patient. They are agreeable to admission if repeat head CT is negative. I have ordered the time CT.  Tanya Knight was evaluated in Emergency Department on 11/29/2019 for the symptoms described in the history of present illness. She was evaluated in the context of the global COVID-19 pandemic, which necessitated  consideration that the patient might be at risk for infection with the SARS-CoV-2 virus that causes COVID-19. Institutional protocols and algorithms that pertain to the evaluation of patients at risk for COVID-19 are in a state of rapid change based on information released by regulatory bodies including the CDC and federal and state organizations. These policies and algorithms were followed during the patient's care in the ED.  CRITICAL CARE Performed by: Harvest Dark   Total critical care time: 30 minutes  Critical care time was exclusive of separately billable procedures and treating other patients.  Critical care was necessary to treat or prevent imminent or life-threatening deterioration.  Critical care was time spent personally by me on the following activities: development of treatment plan with patient and/or surrogate as well as nursing, discussions with consultants, evaluation of patient's response to treatment, examination of patient, obtaining history from patient or surrogate, ordering and performing treatments and interventions, ordering and review of laboratory studies, ordering and review of radiographic studies, pulse oximetry and re-evaluation of patient's condition.   ____________________________________________   FINAL CLINICAL IMPRESSION(S) / ED DIAGNOSES  Fall Subdural hematoma Rib fracture   Harvest Dark, MD 11/29/19 2317    Harvest Dark, MD 12/23/19 [phone]

## 2019-11-29 NOTE — ED Triage Notes (Signed)
Pt to ED after a syncopal episode at 1915. Family reports pt lost consciousness and fell on her right side. Pt reporting pain ti right ribs and head. Hx of seizures as well but no seizure like activity reported and pt reports being compliant with medications. Family also reports for 20 minutes after event pt was having a hard time getting word out. Pt reports she knew the answers to the questions but couldn't say the right words. Pt presents to ED back to baseline at this time. Alert and oriented x 4 with no neuro deficits noted. Pt denies feeling the same as she did during the previously described 20 minutes.   Pt denies blood thinners use.

## 2019-11-29 NOTE — ED Notes (Signed)
Lanell Matar [phone]. Call with updates

## 2019-11-30 ENCOUNTER — Observation Stay: Payer: Medicare PPO

## 2019-11-30 ENCOUNTER — Observation Stay
Admit: 2019-11-30 | Discharge: 2019-11-30 | Disposition: A | Discharge status: Home / Self Care | Payer: Medicare PPO | Attending: Internal Medicine | Admitting: Internal Medicine

## 2019-11-30 ENCOUNTER — Encounter: Payer: Self-pay | Admitting: Internal Medicine

## 2019-11-30 DIAGNOSIS — R55 Syncope and collapse: Secondary | ICD-10-CM

## 2019-11-30 DIAGNOSIS — I10 Essential (primary) hypertension: Secondary | ICD-10-CM | POA: Diagnosis not present

## 2019-11-30 DIAGNOSIS — S065X9A Traumatic subdural hemorrhage with loss of consciousness of unspecified duration, initial encounter: Secondary | ICD-10-CM | POA: Diagnosis not present

## 2019-11-30 DIAGNOSIS — I6523 Occlusion and stenosis of bilateral carotid arteries: Secondary | ICD-10-CM | POA: Diagnosis not present

## 2019-11-30 DIAGNOSIS — S22000A Wedge compression fracture of unspecified thoracic vertebra, initial encounter for closed fracture: Secondary | ICD-10-CM | POA: Diagnosis present

## 2019-11-30 DIAGNOSIS — K219 Gastro-esophageal reflux disease without esophagitis: Secondary | ICD-10-CM | POA: Diagnosis not present

## 2019-11-30 DIAGNOSIS — S2239XA Fracture of one rib, unspecified side, initial encounter for closed fracture: Secondary | ICD-10-CM | POA: Diagnosis present

## 2019-11-30 DIAGNOSIS — S2241XA Multiple fractures of ribs, right side, initial encounter for closed fracture: Secondary | ICD-10-CM

## 2019-11-30 DIAGNOSIS — S199XXA Unspecified injury of neck, initial encounter: Secondary | ICD-10-CM | POA: Diagnosis not present

## 2019-11-30 DIAGNOSIS — S065X0A Traumatic subdural hemorrhage without loss of consciousness, initial encounter: Secondary | ICD-10-CM | POA: Diagnosis not present

## 2019-11-30 DIAGNOSIS — R4789 Other speech disturbances: Secondary | ICD-10-CM | POA: Diagnosis not present

## 2019-11-30 DIAGNOSIS — I6200 Nontraumatic subdural hemorrhage, unspecified: Secondary | ICD-10-CM | POA: Diagnosis not present

## 2019-11-30 DIAGNOSIS — E782 Mixed hyperlipidemia: Secondary | ICD-10-CM | POA: Diagnosis not present

## 2019-11-30 LAB — LIPID PANEL
Cholesterol: 193 mg/dL (ref 0–200)
HDL: 69 mg/dL (ref >40)
LDL Cholesterol: 115 mg/dL — ABNORMAL HIGH (ref 0–99)
Total CHOL/HDL Ratio: 2.8 RATIO
Triglycerides: 45 mg/dL (ref <150)
VLDL: 9 mg/dL (ref 0–40)

## 2019-11-30 LAB — ECHOCARDIOGRAM COMPLETE
AR max vel: 2.66 cm2
AV Area VTI: 2.66 cm2
AV Area mean vel: 2.57 cm2
AV Mean grad: 5 mmHg
AV Peak grad: 8.9 mmHg
Ao pk vel: 1.49 m/s
Area-P 1/2: 3.21 cm2
Height: 61 in
S' Lateral: 2.4 cm
Weight: 1808 oz

## 2019-11-30 LAB — PREALBUMIN: Prealbumin: 18.9 mg/dL (ref 18–38)

## 2019-11-30 IMAGING — US US CAROTID DUPLEX BILAT
1 series · 13 of 24 positions shown · non-contrast
Comparison: None.

CLINICAL DATA: 78-year-old female with a history of syncope

EXAM:
BILATERAL CAROTID DUPLEX ULTRASOUND
TECHNIQUE: Gray scale imaging, color Doppler and duplex ultrasound were
performed of bilateral carotid and vertebral arteries in the neck.

[Series 1: us carotid bilateral · 13 of 70 slices shown]
[im 1/70]
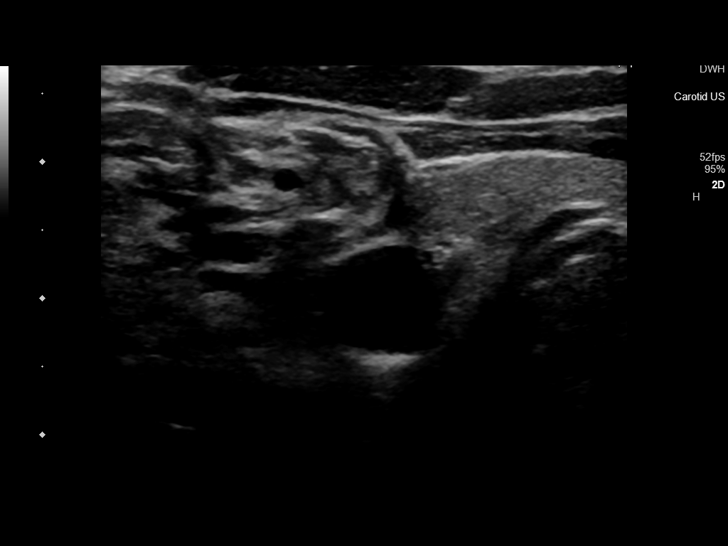
[im 7/70]
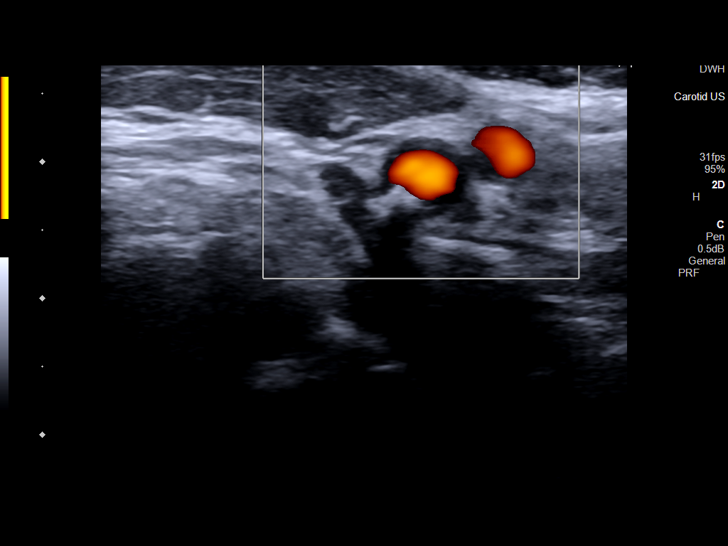
[im 13/70]
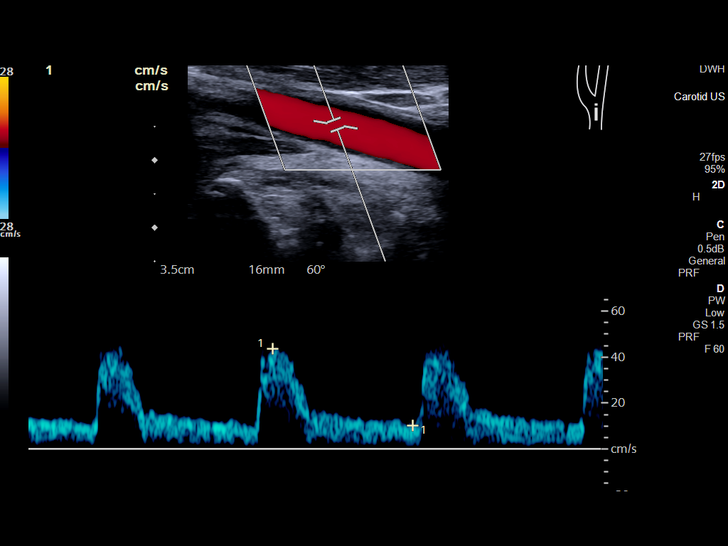
[im 19/70]
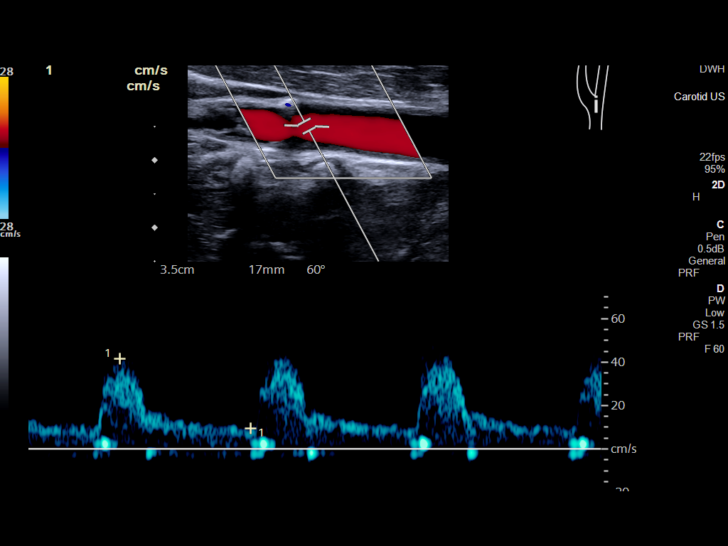
[im 25/70]
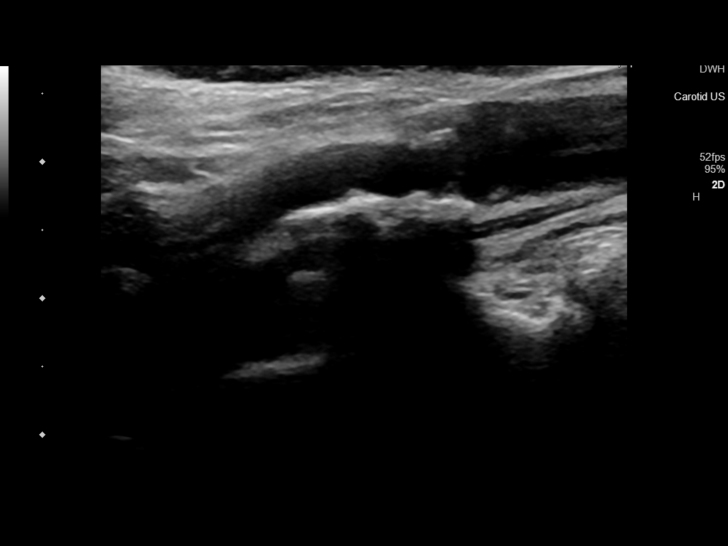
[im 31/70]
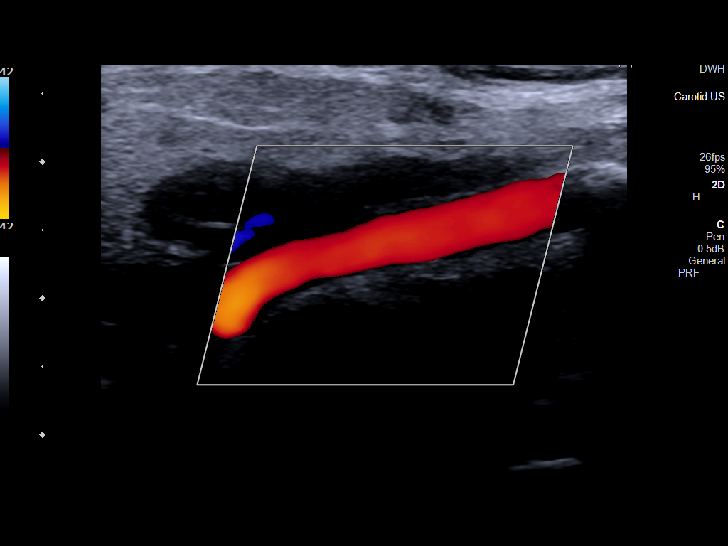
[im 37/70]
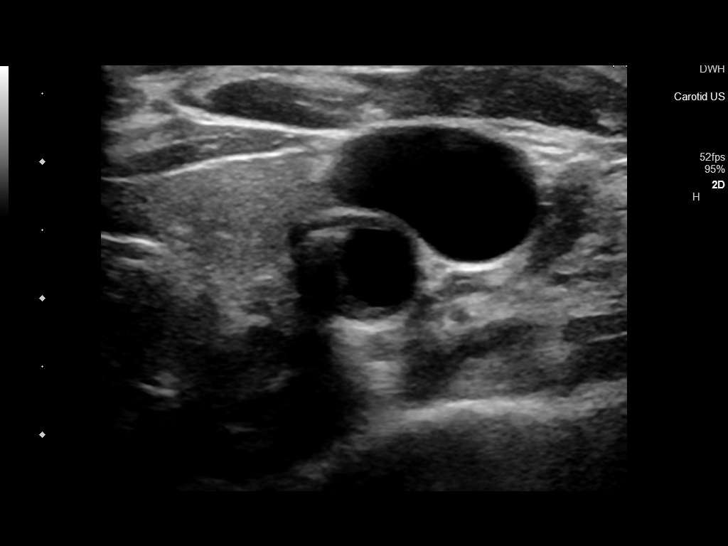
[im 40/70]
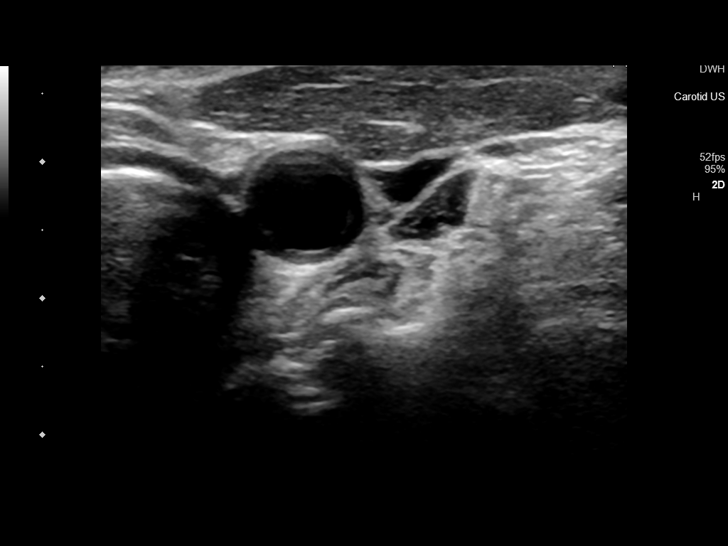
[im 46/70]
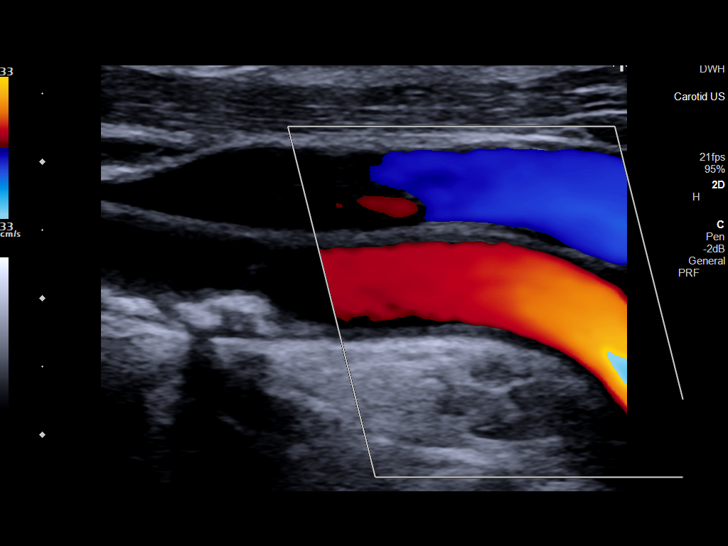
[im 52/70]
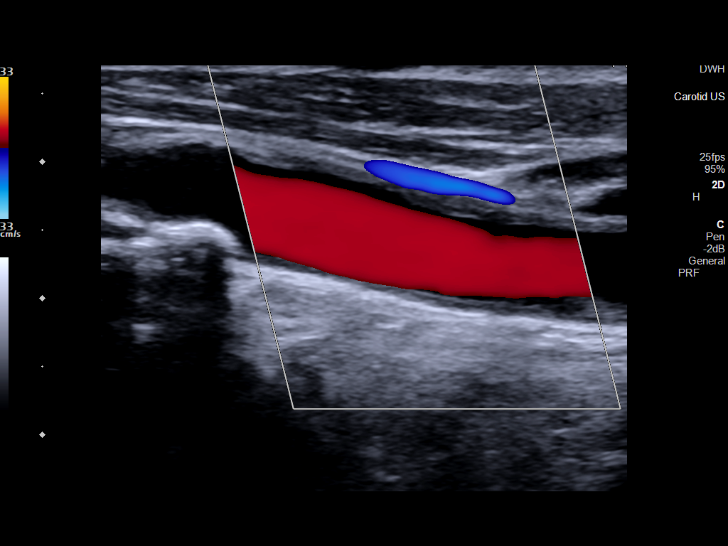
[im 58/70]
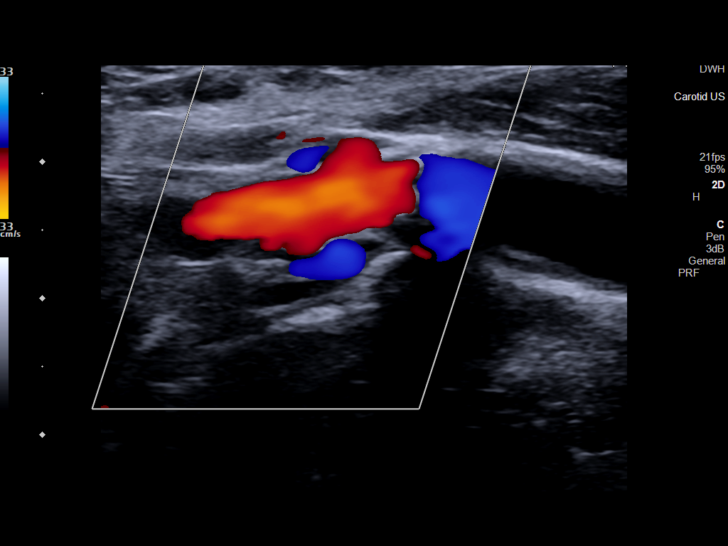
[im 64/70]
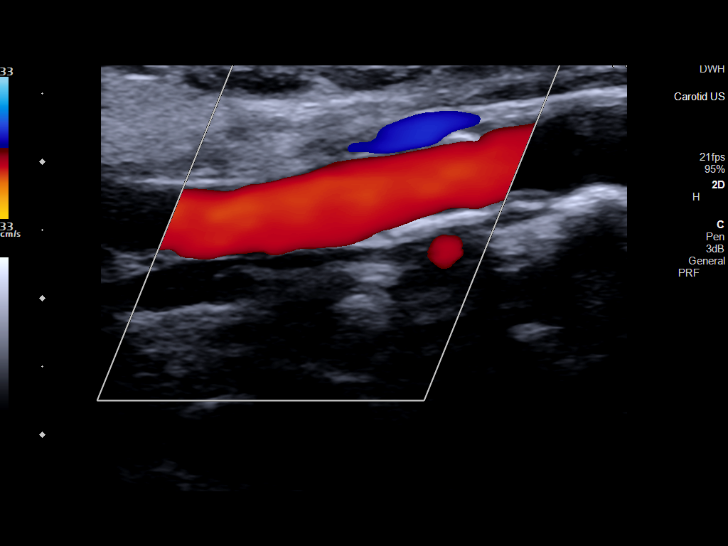
[im 70/70]
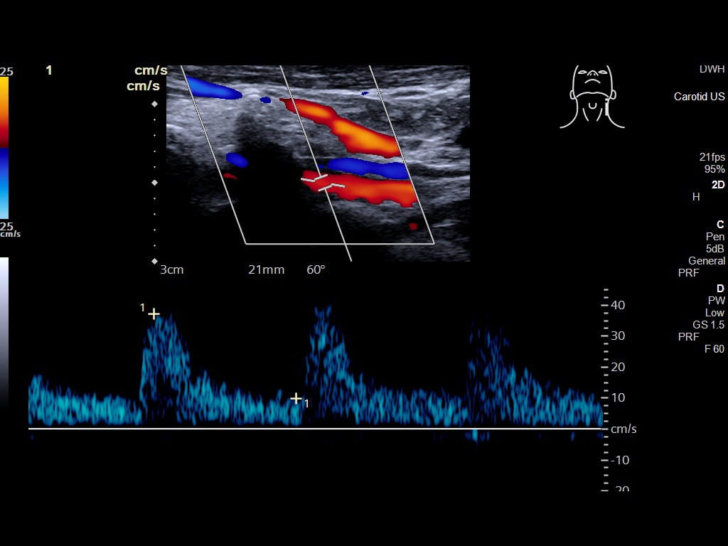

[13 of 24 positions shown; findings below may reference images not displayed]

FINDINGS: Criteria: Quantification of carotid stenosis is based on velocity
parameters that correlate the residual internal carotid diameter
with NASCET-based stenosis levels, using the diameter of the distal
internal carotid lumen as the denominator for stenosis measurement.

The following velocity measurements were obtained:

RIGHT

ICA:  Systolic 63 cm/sec, Diastolic 21 cm/sec

CCA:  59 cm/sec

SYSTOLIC ICA/CCA RATIO:

ECA:  48 cm/sec

LEFT

ICA:  Systolic 75 cm/sec, Diastolic 19 cm/sec

CCA:  72 cm/sec

SYSTOLIC ICA/CCA RATIO:

ECA:  60 cm/sec

Right Brachial SBP: Not acquired

Left Brachial SBP: Not acquired

RIGHT CAROTID ARTERY: No significant calcifications of the right
common carotid artery. Intermediate waveform maintained.
Heterogeneous and partially calcified plaque at the right carotid
bifurcation. No significant lumen shadowing. Low resistance waveform
of the right ICA. No significant tortuosity.

RIGHT VERTEBRAL ARTERY: Antegrade flow with low resistance waveform.

LEFT CAROTID ARTERY: No significant calcifications of the left
common carotid artery. Intermediate waveform maintained.
Heterogeneous and partially calcified plaque at the left carotid
bifurcation without significant lumen shadowing. Low resistance
waveform of the left ICA. No significant tortuosity.

LEFT VERTEBRAL ARTERY:  Antegrade flow with low resistance waveform.
IMPRESSION: Color duplex indicates minimal heterogeneous and calcified plaque,
with no hemodynamically significant stenosis by duplex criteria in
the extracranial cerebrovascular circulation.

## 2019-11-30 IMAGING — CT CT CERVICAL SPINE W/O CM
3 of 4 series · 12 of 33 positions shown, 14 images · non-contrast
Comparison: Chest CT [DATE]

CLINICAL DATA: Head trauma, minor. Additional provided: Reported
loss of consciousness with fall.

EXAM:
CT CERVICAL SPINE WITHOUT CONTRAST
TECHNIQUE: Multidetector CT imaging of the cervical spine was performed without
intravenous contrast. Multiplanar CT image reconstructions were also
generated.

[Series 9: sagittal bone · sagittal · 0.24mm/px · 5 of 54 slices shown, 6 images]
[im 18/54  bone]
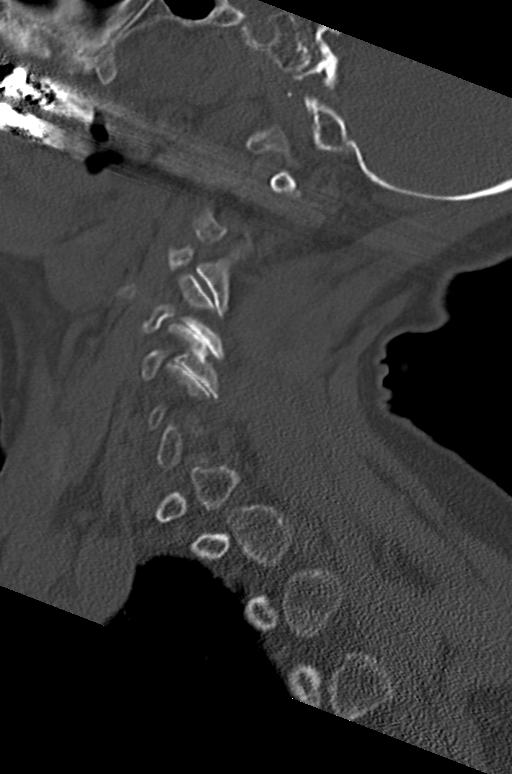
[im 23/54  bone]
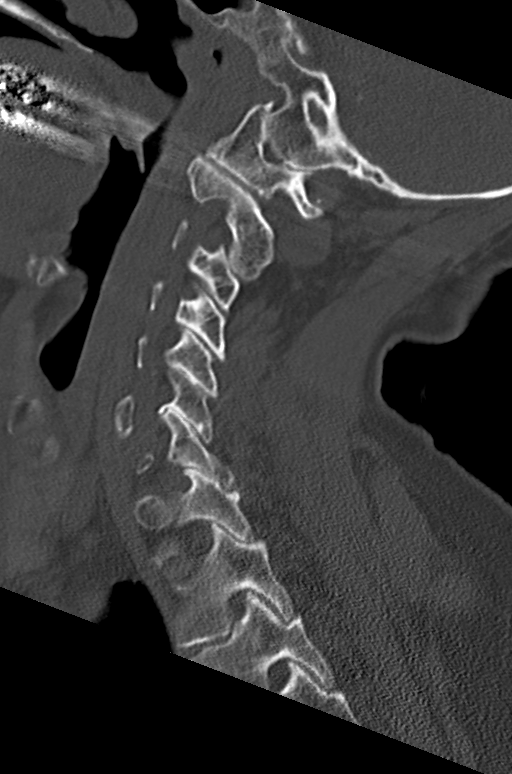
[im 27/54  soft-tissue]
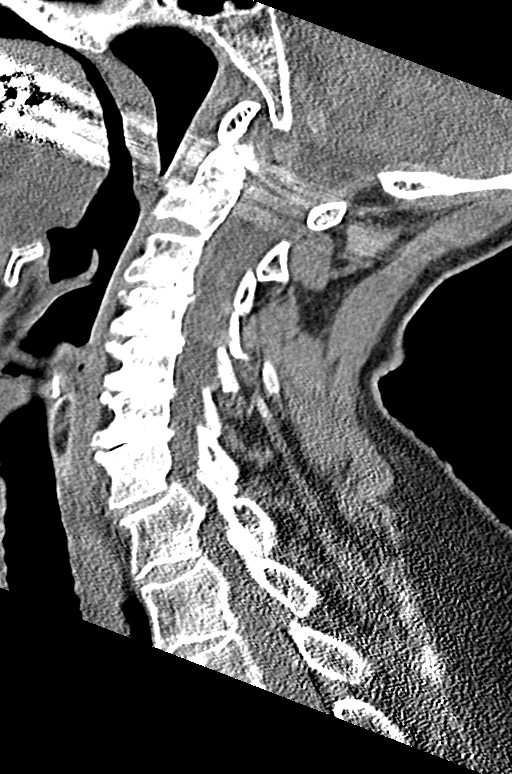
[im 27/54  bone]
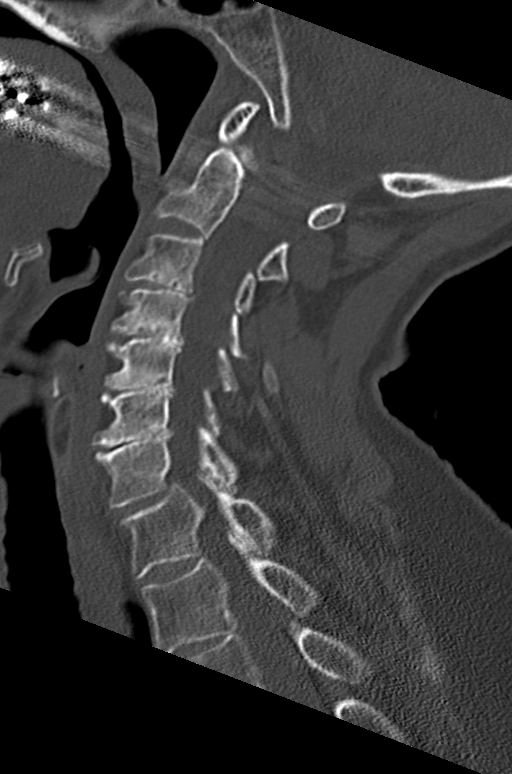
[im 31/54  bone]
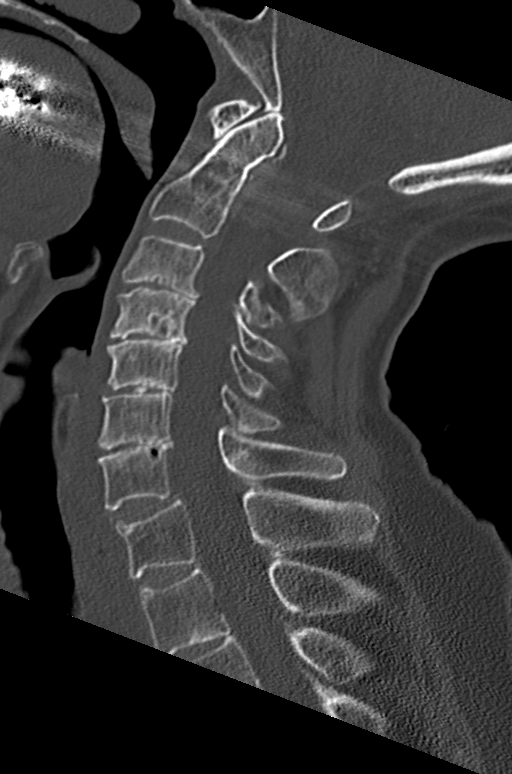
[im 36/54  bone]
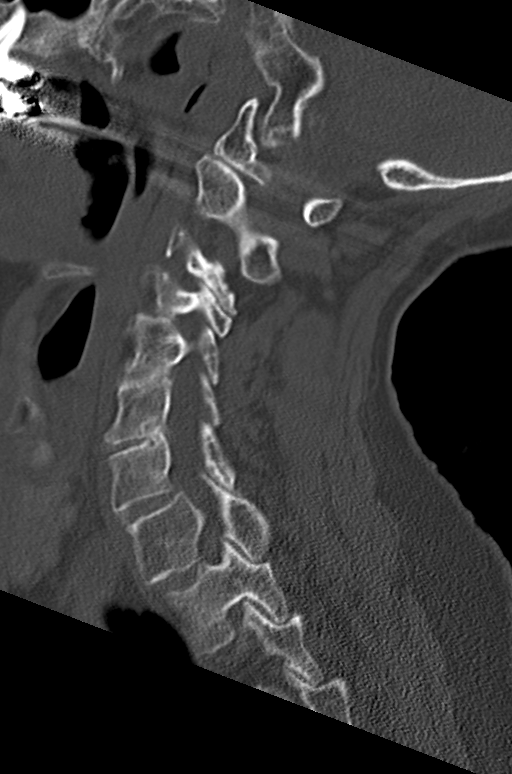

[Series 10: coronal bone · coronal · 0.26mm/px · 3 of 77 slices shown]
[im 16/77  bone]
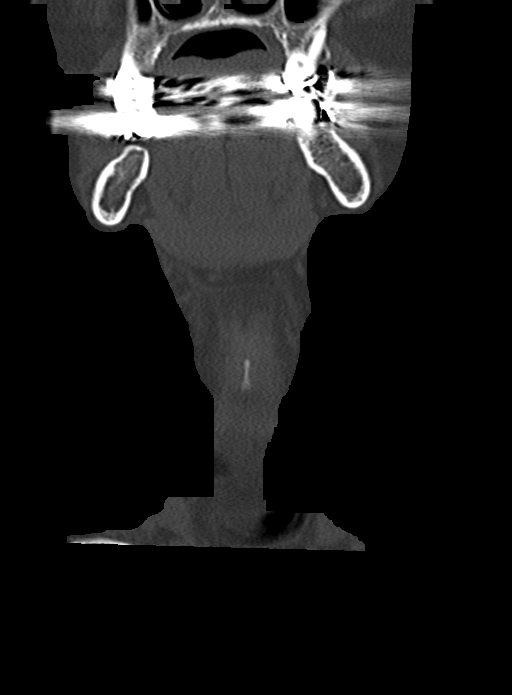
[im 31/77  bone]
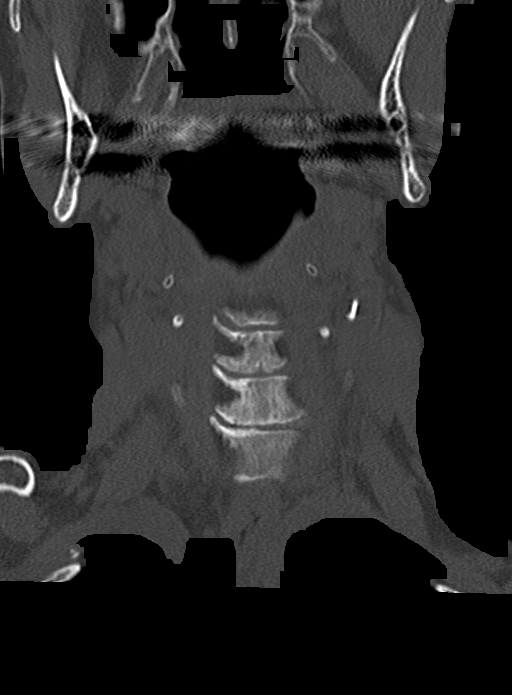
[im 46/77  bone]
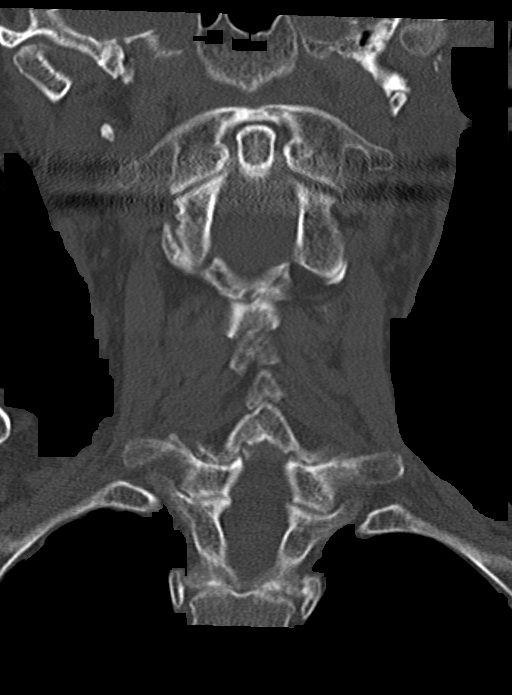

[Series 11: orthogonal bone · axial · 0.25mm/px · z∈[-211,-98]mm · 4 of 86 slices shown, 5 images]
[im 13/86  soft-tissue]
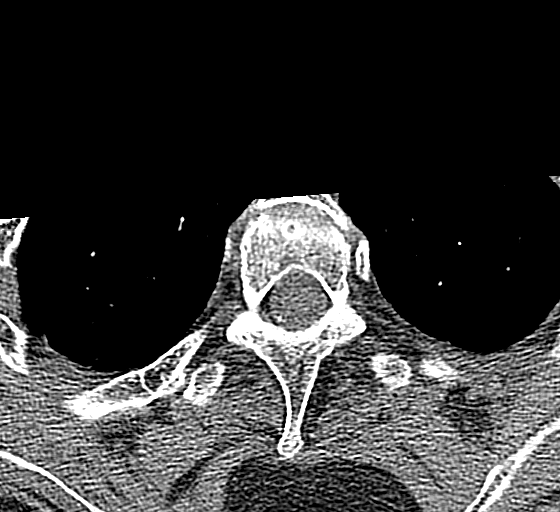
[im 13/86  bone]
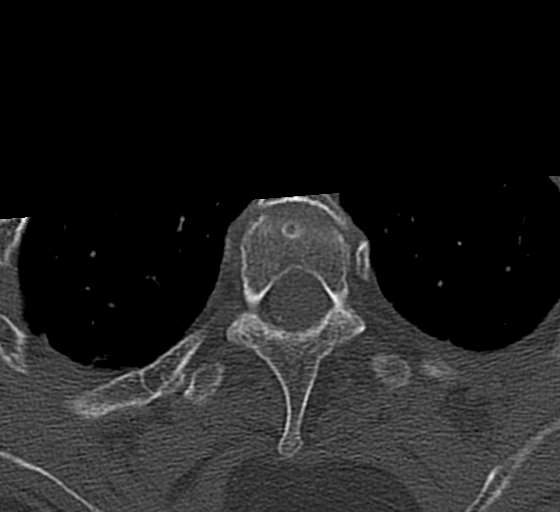
[im 37/86  bone]
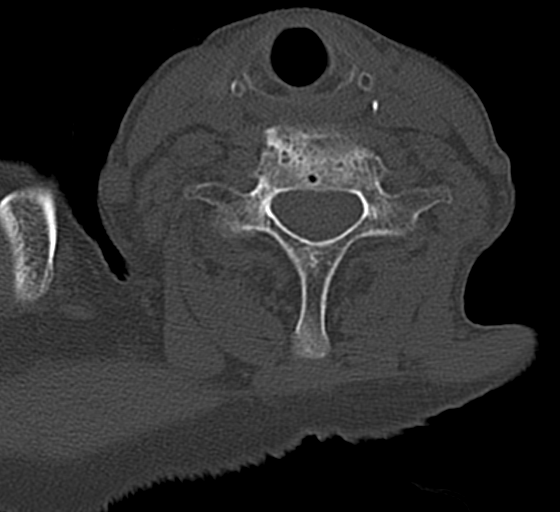
[im 49/86  bone]
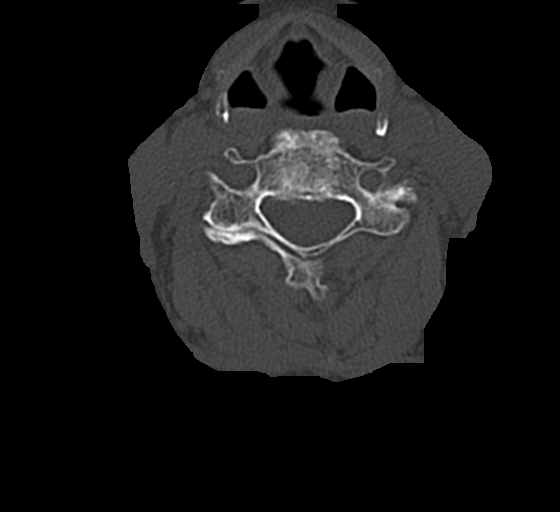
[im 73/86  bone]
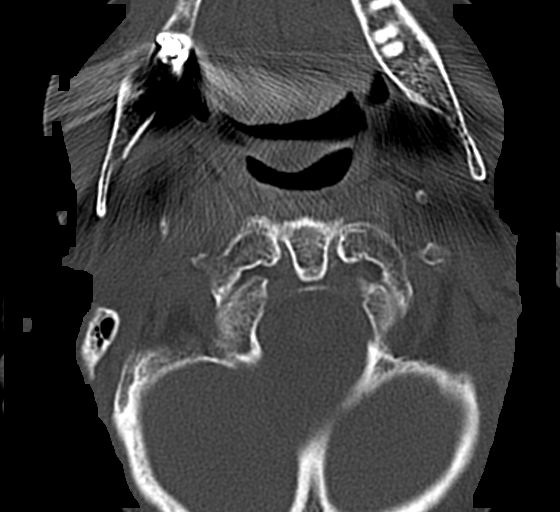

[12 of 33 positions shown; findings below may reference images not displayed]

FINDINGS: Alignment: Straightening of the expected cervical lordosis. C7-T1
grade 1 anterolisthesis.

Skull base and vertebrae: The basion-dental and atlanto-dental
intervals are maintained.No evidence of acute fracture to the
cervical spine. Redemonstrated mild chronic T1 compression deformity
without progressive height loss as compared to the prior chest CT of
[DATE].

Soft tissues and spinal canal: No prevertebral fluid or swelling. No
visible canal hematoma.

Disc levels: Cervical spondylosis. Most notably, there is advanced
disc space narrowing at the C3-C4 through C6-C7 levels with
multilevel degenerative endplate irregularity and endplate
sclerosis. Multilevel uncovertebral and facet hypertrophy. No
high-grade bony spinal canal stenosis.

Upper chest: No consolidation within the imaged lung apices. No
visible pneumothorax.

Other: Calcified atherosclerotic plaque within the carotid arteries.
IMPRESSION: 1. No evidence of acute fracture to the cervical spine.
2. Mild chronic T1 vertebral compression deformity without
progressive height loss as compared to the chest CT of [DATE].
3. C7-T1 grade 1 anterolisthesis.
4. Cervical spondylosis as outlined.

## 2019-11-30 IMAGING — CT CT HEAD W/O CM
3 series · 15 of 46 positions shown, 18 images · non-contrast
Comparison: MRI brain [DATE] [DATE] a.m.

CLINICAL DATA: Fall, recent subdural hemorrhage

EXAM:
CT HEAD WITHOUT CONTRAST
TECHNIQUE: Contiguous axial images were obtained from the base of the skull
through the vertex without intravenous contrast.

[Series 2: head wo · axial · 0.41mm/px · z∈[-40,+80]mm · 9 of 29 slices shown, 12 images]
[im 3/29  brain]
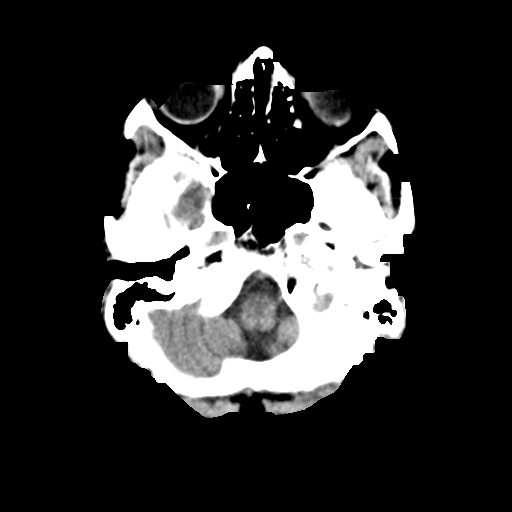
[im 3/29  bone]
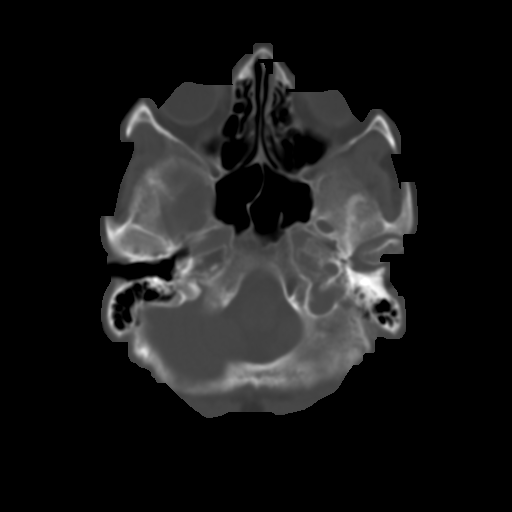
[im 6/29  brain]
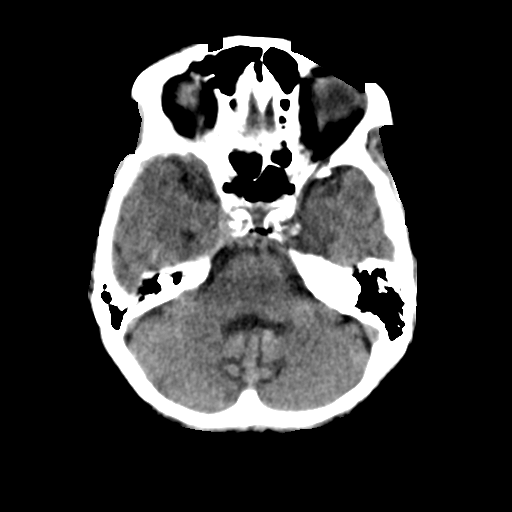
[im 9/29  brain]
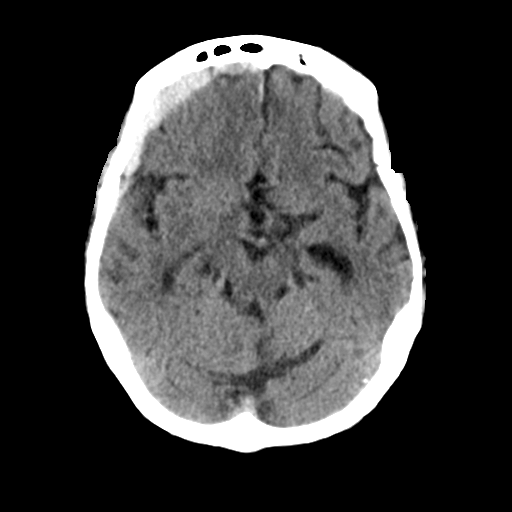
[im 12/29  brain]
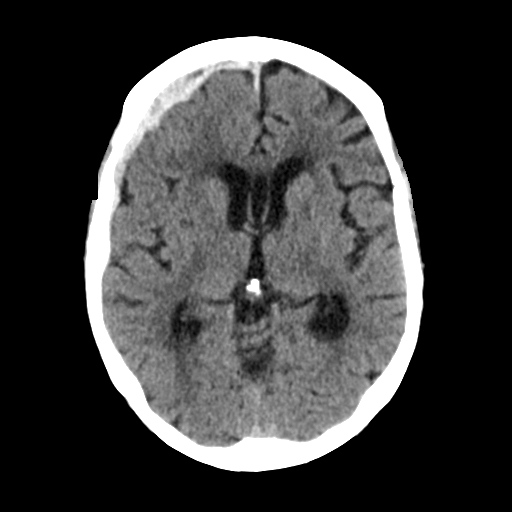
[im 15/29  brain]
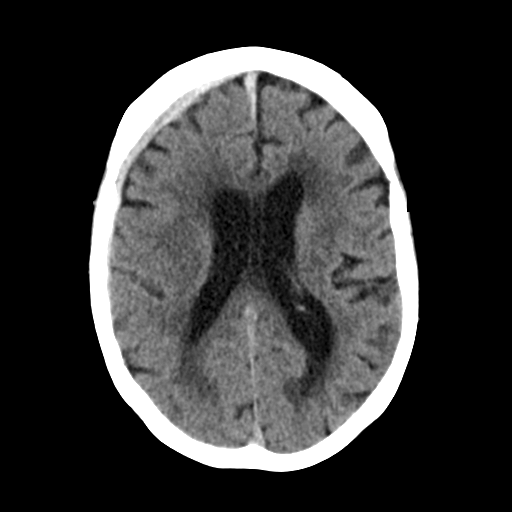
[im 15/29  bone]
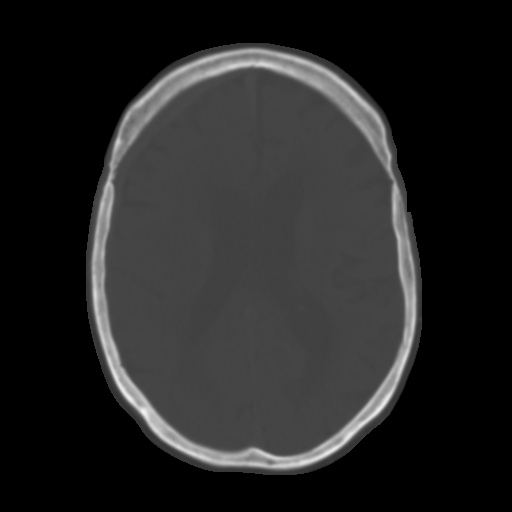
[im 18/29  brain]
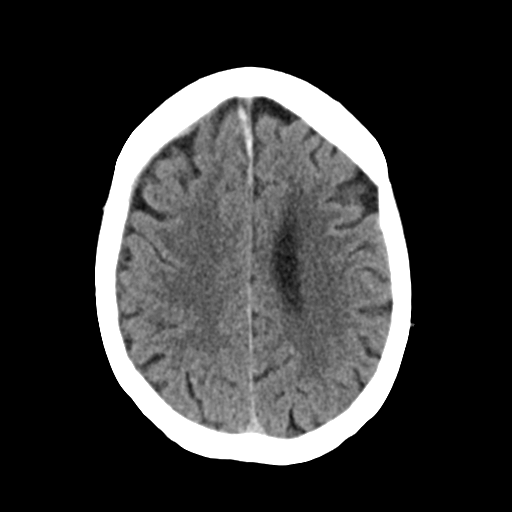
[im 21/29  brain]
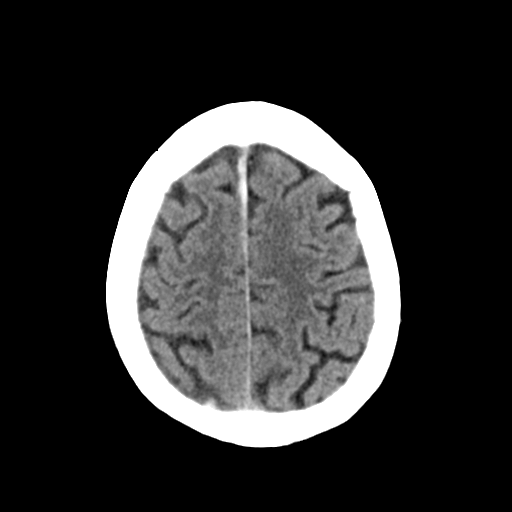
[im 24/29  brain]
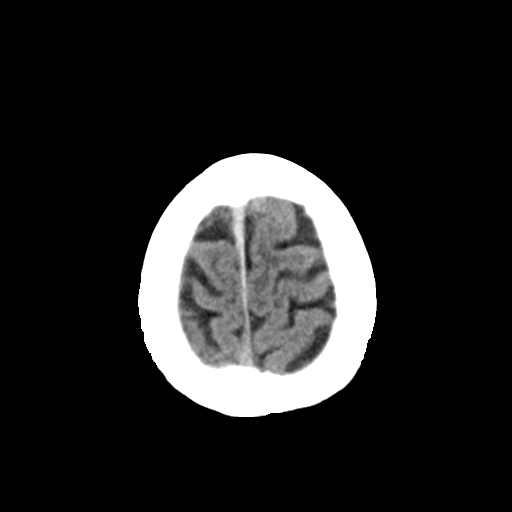
[im 27/29  brain]
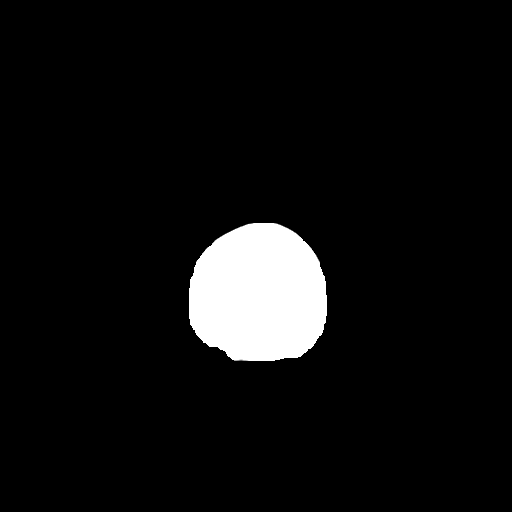
[im 27/29  bone]
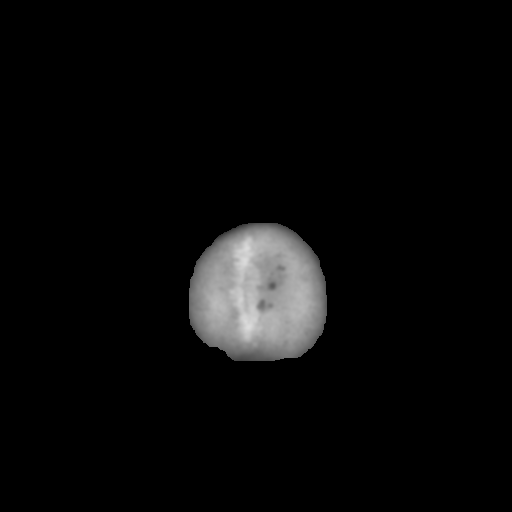

[Series 4: coronal soft tissue · coronal · 0.27mm/px · 3 of 61 slices shown]
[im 21/61  brain]
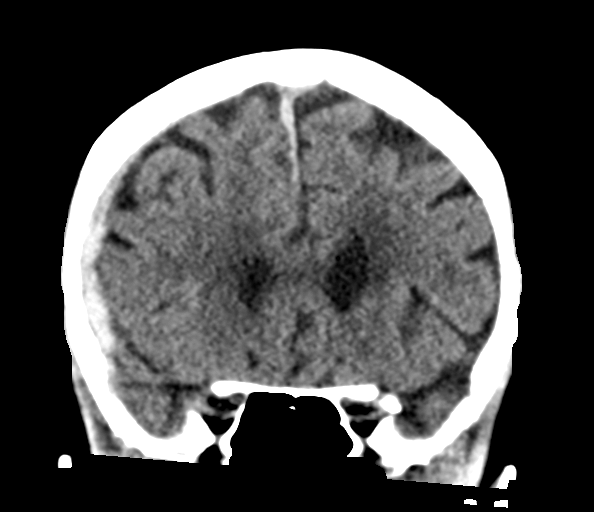
[im 27/61  brain]
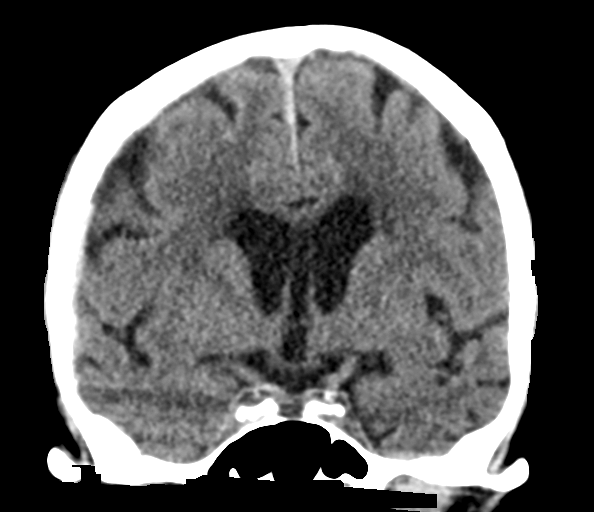
[im 34/61  brain]
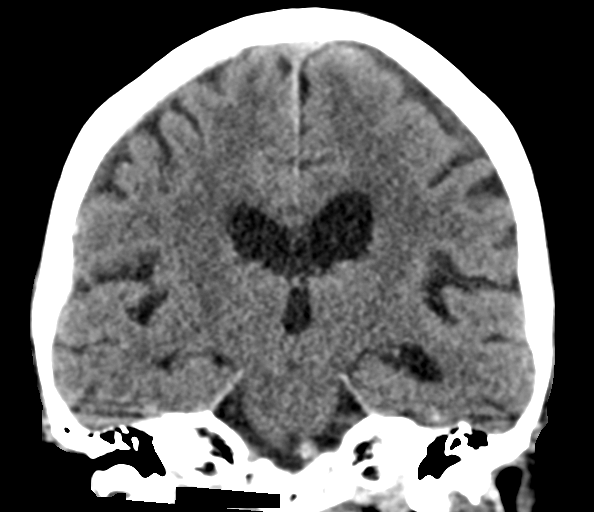

[Series 5: sagittal soft tissue · sagittal · 0.30mm/px · 3 of 49 slices shown]
[im 17/49  brain]
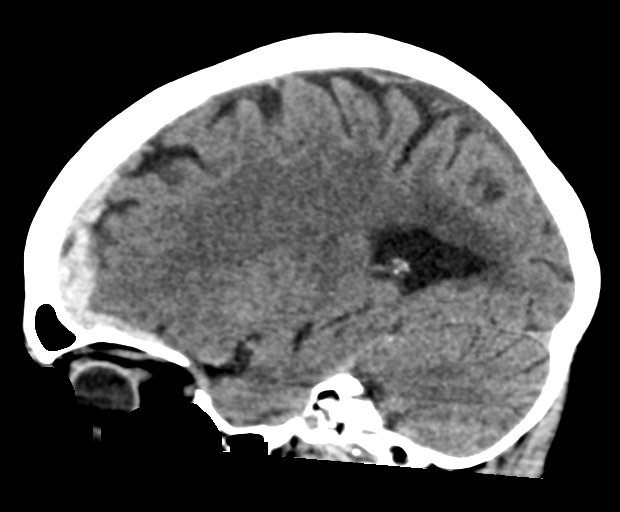
[im 25/49  brain]
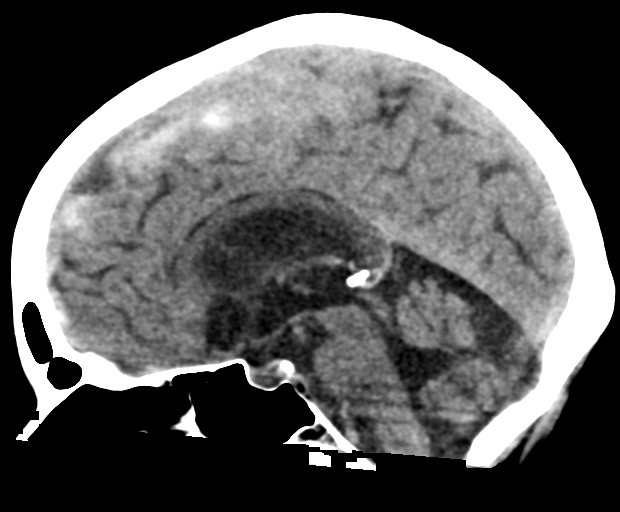
[im 33/49  brain]
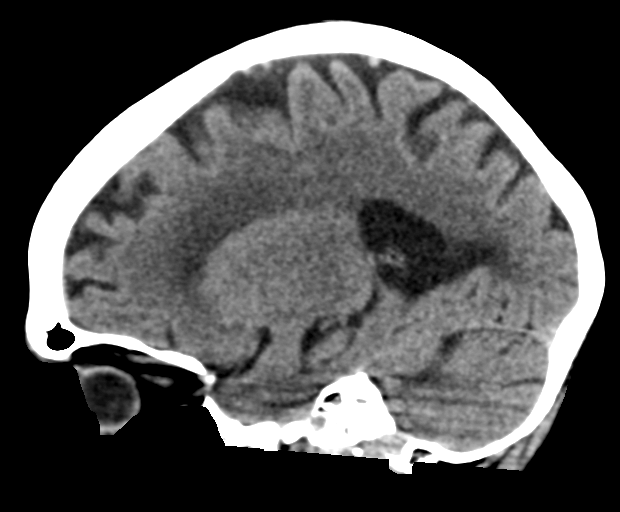

[15 of 46 positions shown; findings below may reference images not displayed]

FINDINGS: Brain: Again noted is a acute subdural hemorrhage seen overlying the
right frontal lobe measuring up to maximum diameter of 8 mm,
slightly decreased in size from the recent MRI. There is also a
small amount of subdural hemorrhage overlying the superior falx
measuring maximum diameter of 3 mm. There is mild mass effect along
the right frontal lobe. There is minimal right to leftward shift of
2 mm. There is dilatation the ventricles and sulci consistent with
age-related atrophy. Low-attenuation changes in the deep white
matter consistent with small vessel ischemia. Area of hypodensity
seen within the left cerebellum from prior hemorrhagic stroke.

Vascular: No hyperdense vessel or unexpected calcification.

Skull: The skull is intact. No fracture or focal lesion identified.

Sinuses/Orbits: The visualized paranasal sinuses and mastoid air
cells are clear. The orbits and globes intact.

Other: None
IMPRESSION: 1. Slight interval decrease in the acute subdural hemorrhage
overlying the right frontal convexity and superior falx measuring a
maximum diameter of 8 mm. Mild mass effect upon the right frontal
lobe. No new extra-axial fluid collections.
2. Findings consistent with age related atrophy and chronic small
vessel ischemia

## 2019-11-30 IMAGING — MR MR HEAD W/O CM
11 series · 48 of 48 positions shown · non-contrast
Comparison: Comparison made with prior CT from [DATE] as well
as previous MRI from [DATE].

CLINICAL DATA: Initial evaluation for focal neural deficit, history
of fall.

EXAM:
MRI HEAD WITHOUT CONTRAST
TECHNIQUE: Multiplanar, multiecho pulse sequences of the brain and surrounding
structures were obtained without intravenous contrast.

[Series 5: ax dwi_tracew · axial · 3.0mm · 1.31mm/px · z∈[-85,+70]mm · 5 of 48 slices shown]
[im 1/48]
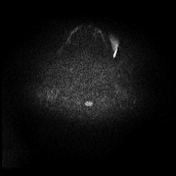
[im 12/48]
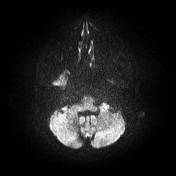
[im 24/48]
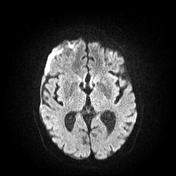
[im 36/48]
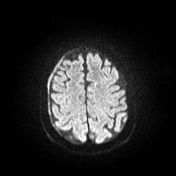
[im 48/48]
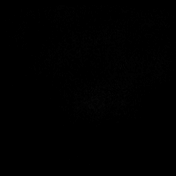

[Series 6: ax dwi_adc · axial · 3.0mm · 1.31mm/px · z∈[-85,+67]mm · 4 of 47 slices shown]
[im 1/47]
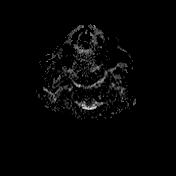
[im 16/47]
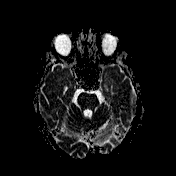
[im 31/47]
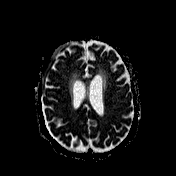
[im 47/47]
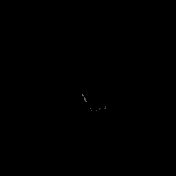

[Series 7: cor dwi_tracew · coronal · 5.0mm · 1.31mm/px · 3 of 36 slices shown]
[im 1/36]
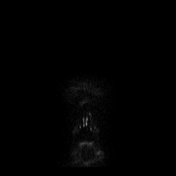
[im 18/36]
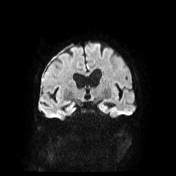
[im 36/36]
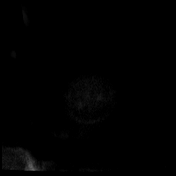

[Series 8: cor dwi_adc · coronal · 5.0mm · 1.31mm/px · 3 of 36 slices shown]
[im 1/36]
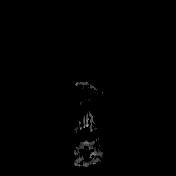
[im 18/36]
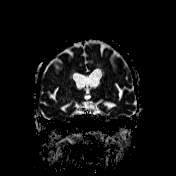
[im 36/36]
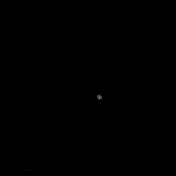

[Series 9: T1 · sagittal · 5.0mm · 0.94mm/px · 2 of 21 slices shown (1 of 2)]
[im 1/21]
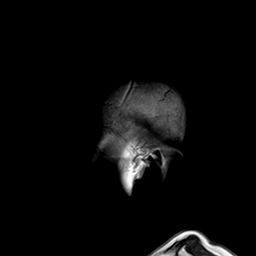
[im 21/21]
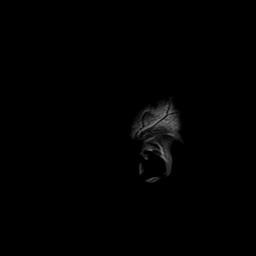

[Series 11: pha_images · axial · 3.0mm · 0.90mm/px · z∈[-79,+70]mm · 4 of 51 slices shown]
[im 1/51]
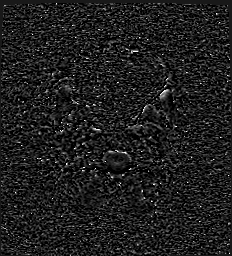
[im 17/51]
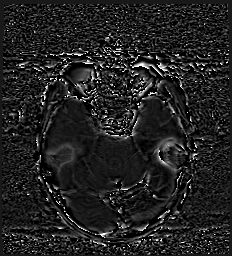
[im 34/51]
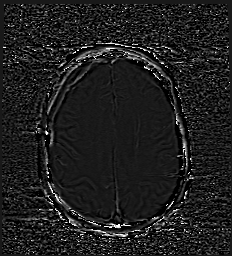
[im 51/51]
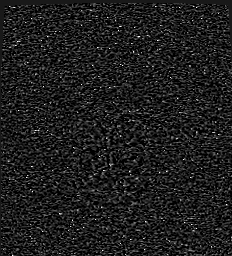

[Series 12: swi_images · axial · 3.0mm · 0.90mm/px · z∈[-82,+70]mm · 4 of 52 slices shown]
[im 1/52]
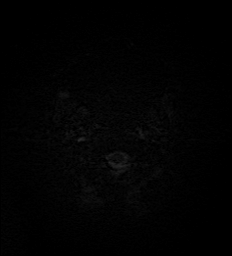
[im 18/52]
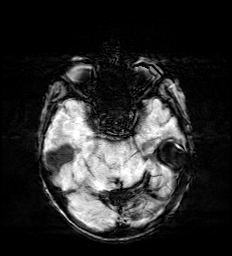
[im 35/52]
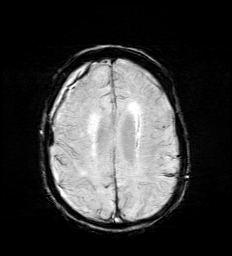
[im 52/52]
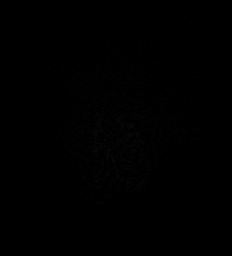

[Series 14: FLAIR · axial · 3.0mm · 0.69mm/px · z∈[-79,+67]mm · 4 of 50 slices shown]
[im 1/50]
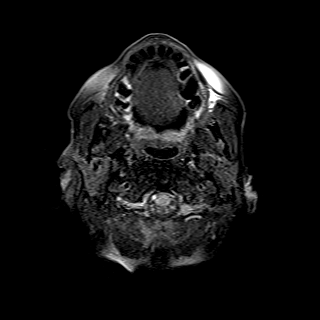
[im 17/50]
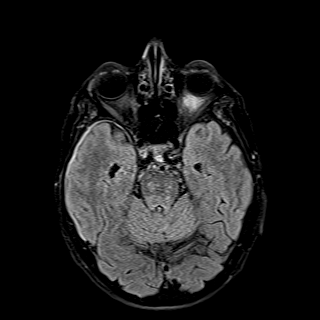
[im 33/50]
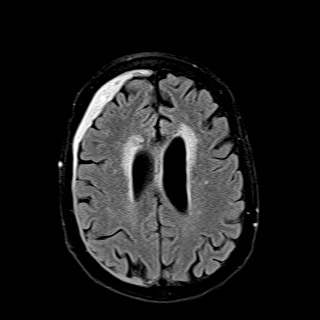
[im 50/50]
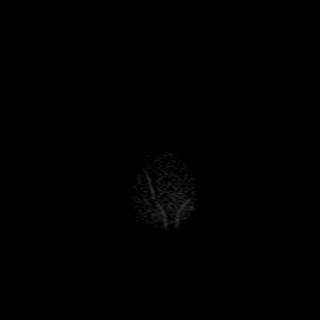

[Series 15: T2 · axial · 5.0mm · 0.53mm/px · z∈[-78,+65]mm · 2 of 25 slices shown (1 of 2)]
[im 1/25]
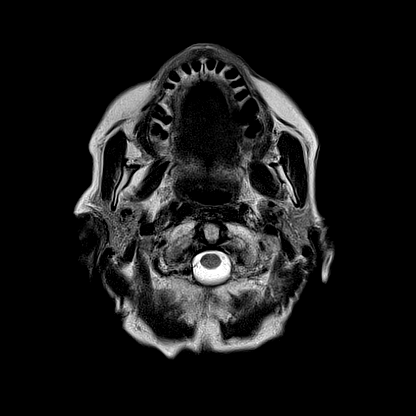
[im 25/25]
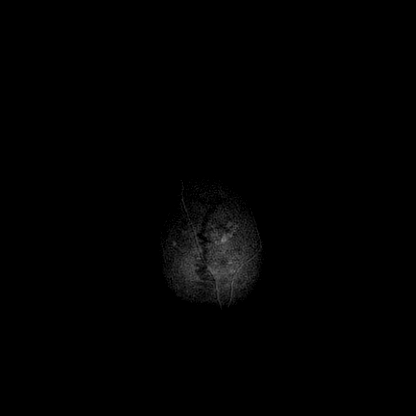

[Series 16: T1 · axial · 1.0mm · 0.98mm/px · z∈[-92,+81]mm · 15 of 173 slices shown (2 of 2)]
[im 1/173]
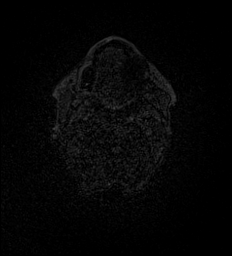
[im 13/173]
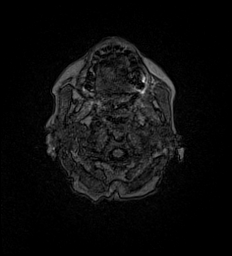
[im 25/173]
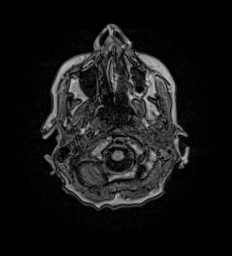
[im 37/173]
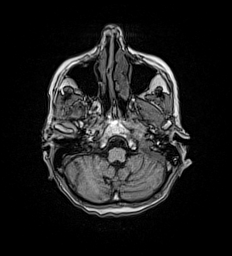
[im 50/173]
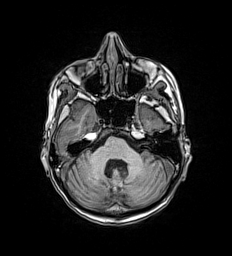
[im 62/173]
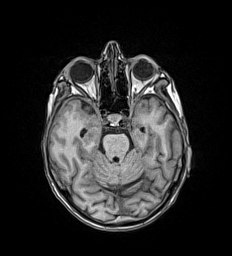
[im 74/173]
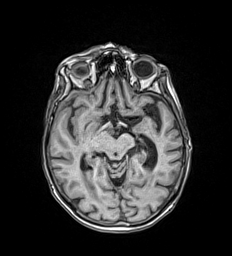
[im 87/173]
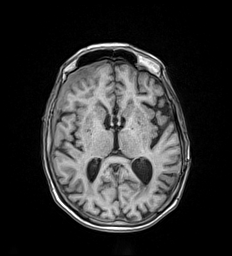
[im 99/173]
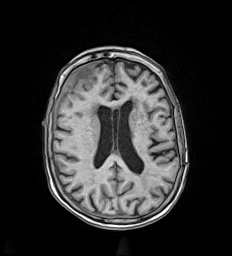
[im 111/173]
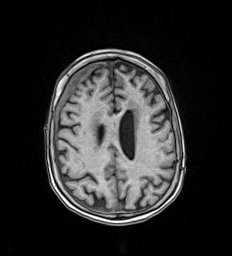
[im 123/173]
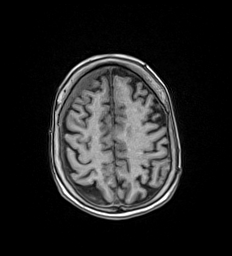
[im 136/173]
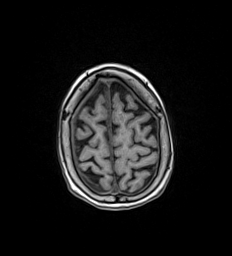
[im 148/173]
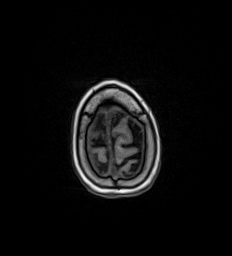
[im 160/173]
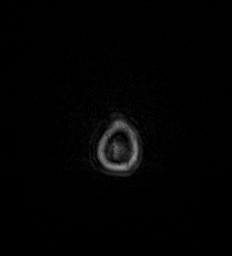
[im 173/173]
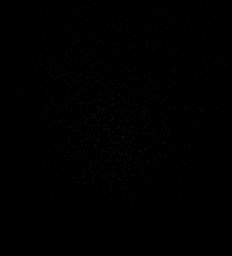

[Series 17: T2 · coronal · 5.0mm · 0.57mm/px · 2 of 29 slices shown (2 of 2)]
[im 1/29]
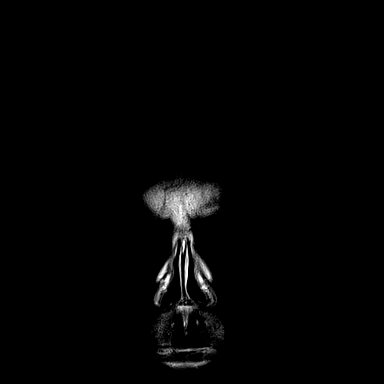
[im 29/29]
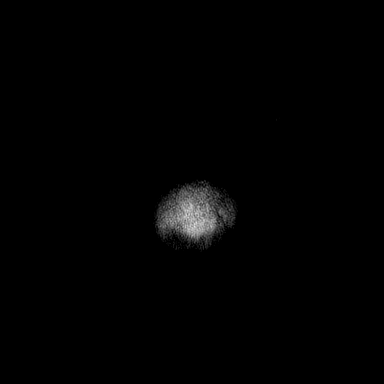

[48 of 48 positions shown; findings below may reference images not displayed]

FINDINGS: Brain: Generalized age-related cerebral atrophy. Patchy T2/FLAIR
hyperintensity within the periventricular deep white matter both
cerebral hemispheres as well as the pons, most consistent with
chronic small vessel ischemic disease, mild to moderate in nature.

There has been interval evolution of previously seen parenchymal
hemorrhage involving the left cerebellum and cerebellar vermis, now
chronic in appearance. Associated chronic hemosiderin staining seen
throughout this region. Scattered foci of cystic encephalomalacia
with no definite underlying lesion as previously question on prior
MRI. Associated mild ex vacuo dilatation of the fourth ventricle.
Small amount of chronic hemosiderin staining also noted at the
posterior left temporoparietal region.

Previously identified acute subdural hematoma again seen overlying
the right cerebral convexity, most pronounced at the right frontal
lobe. This measures up to 13 mm in maximal thickness at the level of
the right frontal convexity with mild mass effect on the adjacent
right frontal lobe. No significant midline shift. Extension along
the falx with a small 4 mm parafalcine component noted as well.

No evidence for acute or subacute infarct. Gray-white matter
differentiation otherwise maintained. No other areas of chronic
cortical infarction.

No mass lesion. Ventricles normal size without hydrocephalus. Cavum
et septum pellucidum noted.

Vascular: Major intracranial vascular flow voids are maintained.

Skull and upper cervical spine: Craniocervical junction within
normal limits. Bone marrow signal intensity normal. No visible scalp
soft tissue abnormality.

Sinuses/Orbits: Patient status post bilateral ocular lens
replacement. Globes orbital soft tissues demonstrate no acute
finding. Paranasal sinuses are largely clear. No significant mastoid
effusion. Inner ear structures grossly normal.

Other: None.
IMPRESSION: 1. Acute subdural hematoma overlying the right cerebral convexity,
measuring up to 13 mm in maximal thickness at the level of the right
frontal convexity. Mild mass effect on the subjacent right frontal
lobe without significant midline shift.
2. No other acute intracranial abnormality.
3. Interval evolution of previously seen parenchymal hemorrhage
involving the left cerebellum and cerebellar vermis, now chronic in
appearance. Scattered foci of cystic encephalomalacia without
definite underlying lesion as previously questioned on prior MRI.
4. Underlying age-related cerebral atrophy with mild-to-moderate
chronic small vessel ischemic disease.

## 2019-11-30 IMAGING — CT CT ANGIO HEAD
3 of 6 series · 19 of 47 positions shown · IV contrast (APPLIED)
Comparison: [DATE]

CLINICAL DATA: Subdural hematoma

EXAM:
CT ANGIOGRAPHY HEAD
TECHNIQUE: Multidetector CT imaging of the head was performed using the
standard protocol during bolus administration of intravenous
contrast. Multiplanar CT image reconstructions and MIPs were
obtained to evaluate the vascular anatomy.
CONTRAST:  75mL OMNIPAQUE IOHEXOL 350 MG/ML SOLN

[Series 3: cta head · axial · 0.40mm/px · z∈[-73,+73]mm · 13 of 83 slices shown]
[im 5/83  brain]
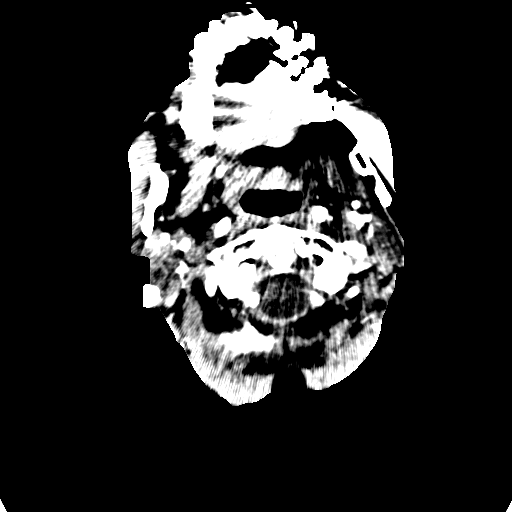
[im 13/83  bone]
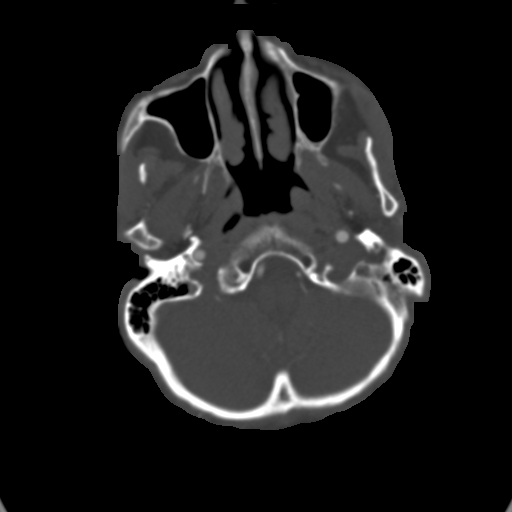
[im 17/83  brain]
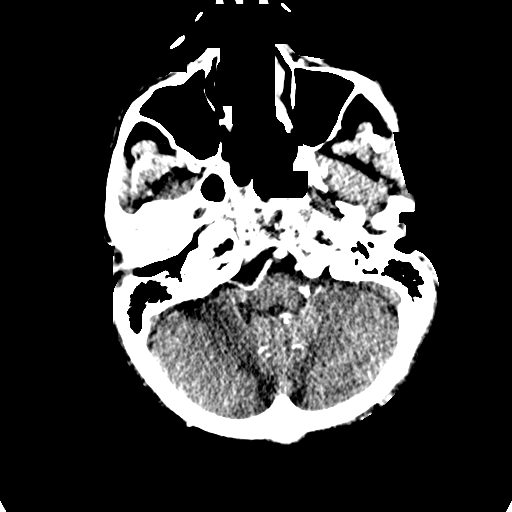
[im 25/83  bone]
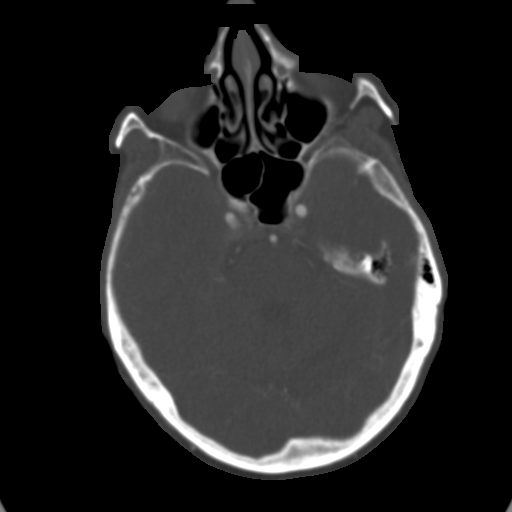
[im 29/83  brain]
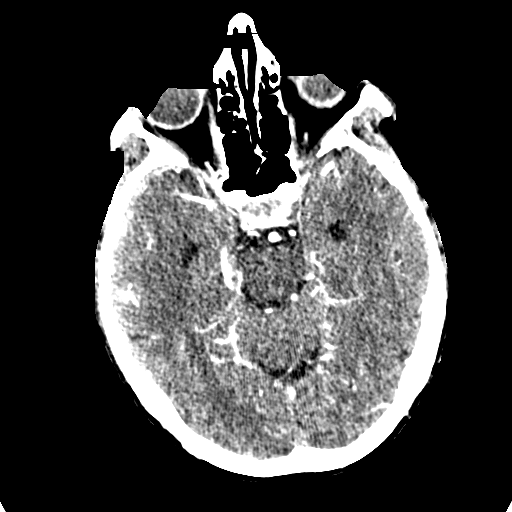
[im 37/83  bone]
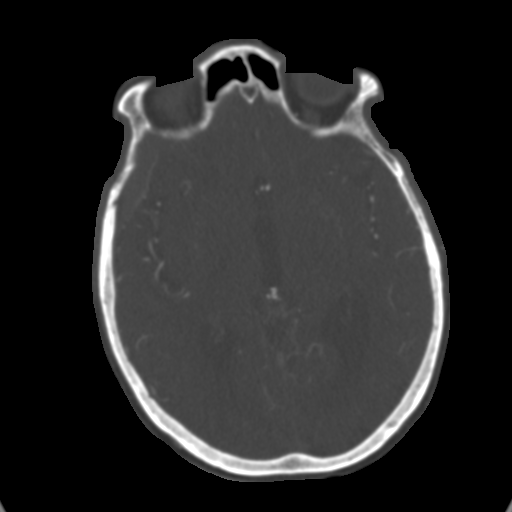
[im 42/83  brain]
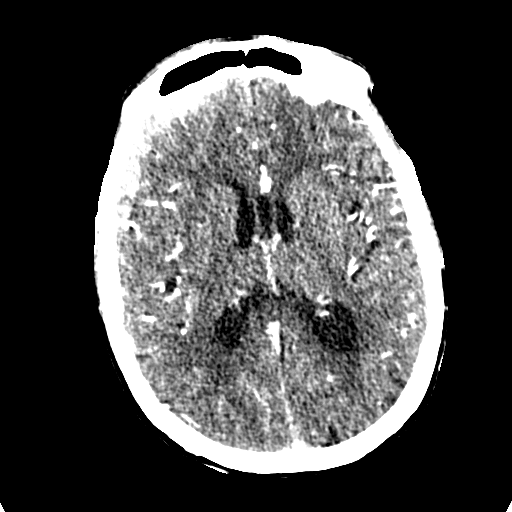
[im 46/83  bone]
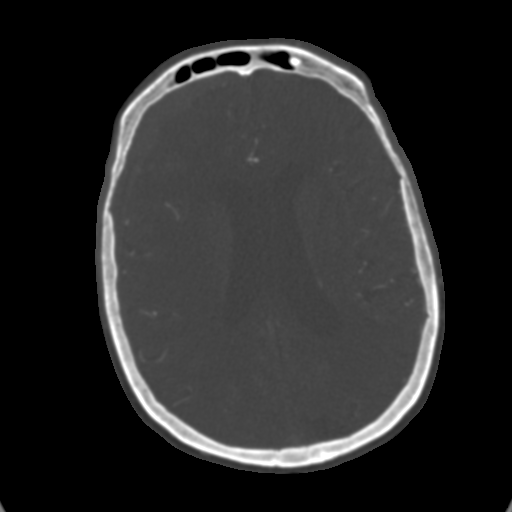
[im 54/83  brain]
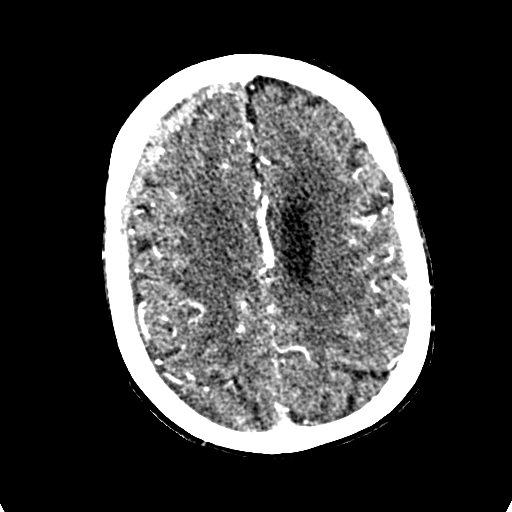
[im 58/83  bone]
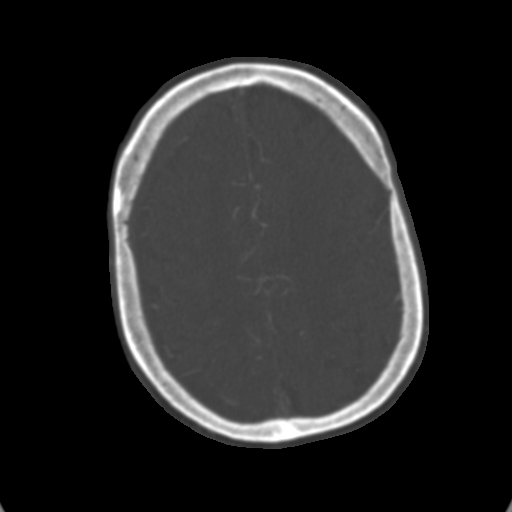
[im 66/83  brain]
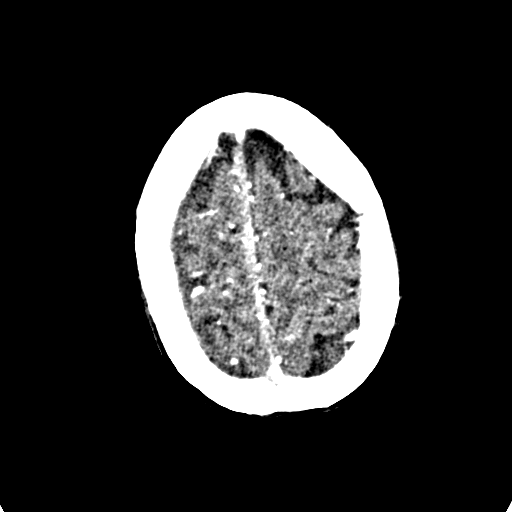
[im 70/83  bone]
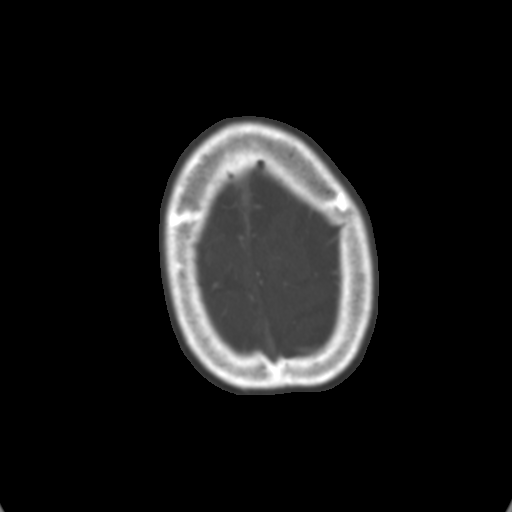
[im 78/83  brain]
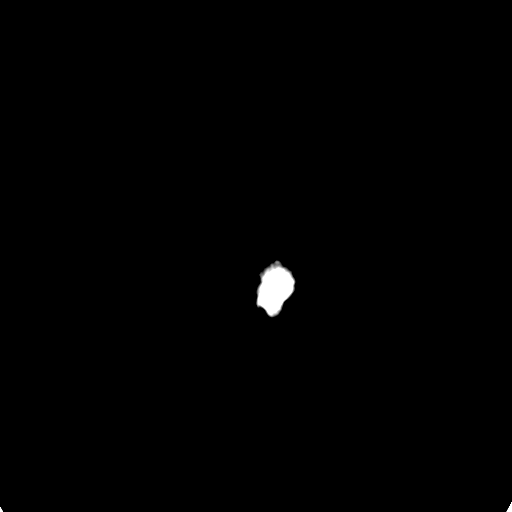

[Series 7: cor thin · coronal · 0.33mm/px · 3 of 200 slices shown]
[im 57/200  brain]
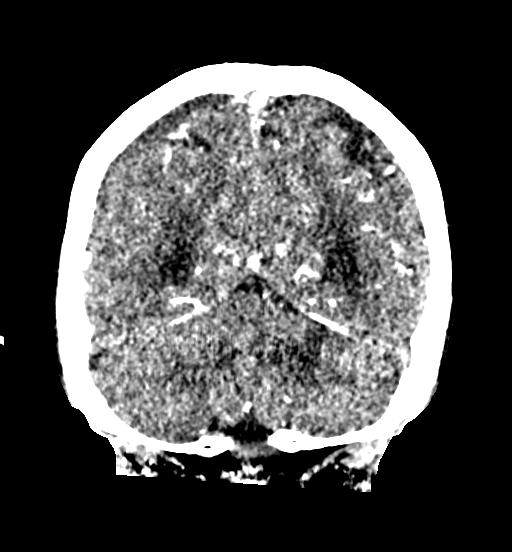
[im 86/200  brain]
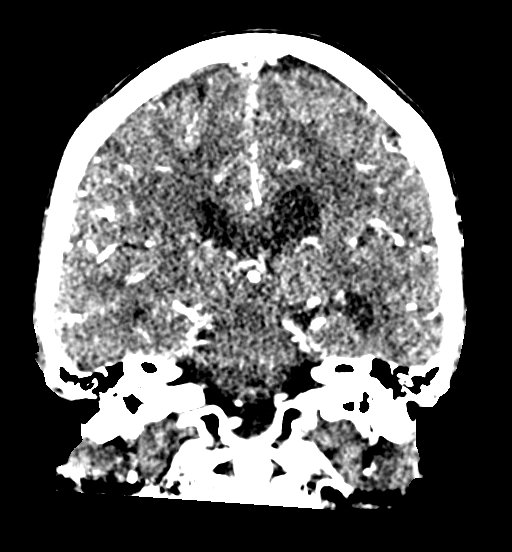
[im 114/200  brain]
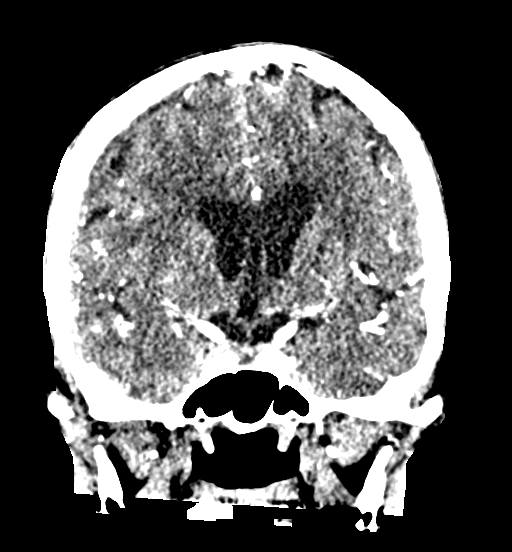

[Series 9: sag thin · sagittal · 0.35mm/px · 3 of 164 slices shown]
[im 10/164  brain]
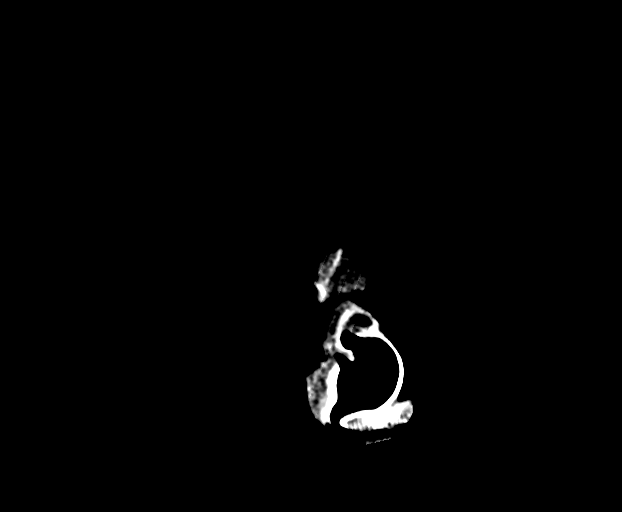
[im 19/164  brain]
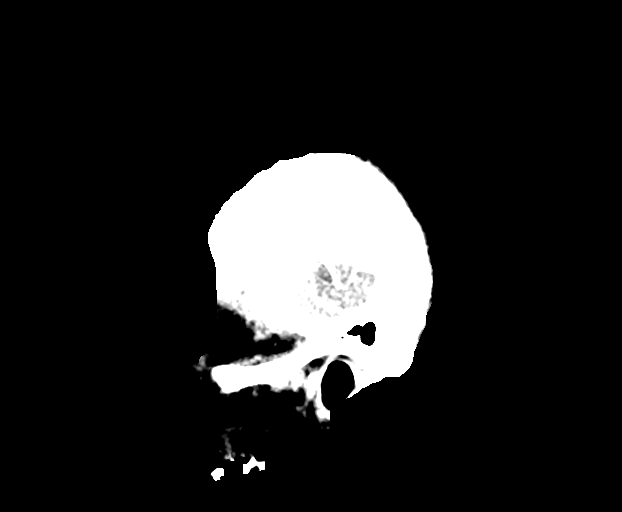
[im 28/164  brain]
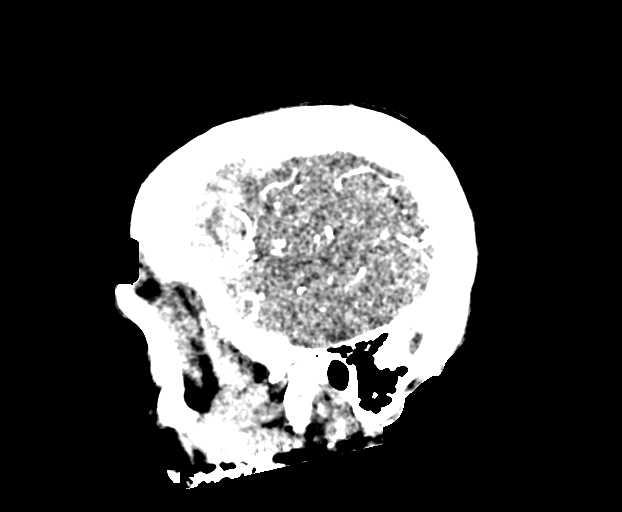

[19 of 47 positions shown; findings below may reference images not displayed]

FINDINGS: CTA HEAD

Anterior circulation: Intracranial internal carotid arteries are
patent with mild calcified plaque. A 2 mm inferiorly directed
aneurysm from may distal supraclinoid right ICA is unchanged.
Anterior and middle cerebral arteries patent.

Posterior circulation: Intracranial vertebral arteries, basilar
artery, and posterior cerebral arteries are patent. A left posterior
communicating arteries present with fetal or near fetal origin of
the PCA.

Venous sinuses: Not well evaluated.
IMPRESSION: Stable 2 mm aneurysm of the distal supraclinoid right ICA. No acute
findings.

## 2019-11-30 MED ORDER — ONDANSETRON HCL 4 MG/2ML IJ SOLN
4.0000 mg | Freq: Four times a day (QID) | INTRAMUSCULAR | Status: DC | PRN
  Administered 2019-11-30: 4 mg via INTRAVENOUS
  Filled 2019-11-30: qty 2

## 2019-11-30 MED ORDER — SENNOSIDES-DOCUSATE SODIUM 8.6-50 MG PO TABS: 1.0000 | ORAL_TABLET | Freq: Every evening | ORAL | Status: DC | PRN

## 2019-11-30 MED ORDER — LORAZEPAM 2 MG/ML IJ SOLN: 2.0000 mg | INTRAMUSCULAR | Status: DC | PRN

## 2019-11-30 MED ORDER — LIDOCAINE 5 % EX PTCH
1.0000 | MEDICATED_PATCH | CUTANEOUS | Status: DC
  Administered 2019-11-30 – 2019-12-01 (×2): 1 via TRANSDERMAL
  Filled 2019-11-30 (×2): qty 1

## 2019-11-30 MED ORDER — SODIUM CHLORIDE 0.9 % IV SOLN: INTRAVENOUS | Status: DC

## 2019-11-30 MED ORDER — AMLODIPINE BESYLATE 5 MG PO TABS
7.5000 mg | ORAL_TABLET | Freq: Every evening | ORAL | Status: DC
  Administered 2019-11-30: 7.5 mg via ORAL
  Filled 2019-11-30: qty 2

## 2019-11-30 MED ORDER — IOHEXOL 350 MG/ML SOLN
75.0000 mL | Freq: Once | INTRAVENOUS | Status: AC | PRN
  Administered 2019-11-30: 75 mL via INTRAVENOUS
  Filled 2019-11-30: qty 75

## 2019-11-30 MED ORDER — ACETAMINOPHEN 325 MG PO TABS
650.0000 mg | ORAL_TABLET | ORAL | Status: DC | PRN
  Administered 2019-11-30 – 2019-12-01 (×4): 650 mg via ORAL
  Filled 2019-11-30 (×4): qty 2

## 2019-11-30 MED ORDER — DIVALPROEX SODIUM 500 MG PO DR TAB
500.0000 mg | DELAYED_RELEASE_TABLET | Freq: Two times a day (BID) | ORAL | Status: DC
  Administered 2019-11-30 – 2019-12-01 (×4): 500 mg via ORAL
  Filled 2019-11-30 (×5): qty 1

## 2019-11-30 MED ORDER — STROKE: EARLY STAGES OF RECOVERY BOOK
Freq: Once | Status: AC
  Administered 2019-11-30: 1

## 2019-11-30 MED ORDER — ACETAMINOPHEN 650 MG RE SUPP: 650.0000 mg | RECTAL | Status: DC | PRN

## 2019-11-30 MED ORDER — ACETAMINOPHEN 160 MG/5ML PO SOLN
650.0000 mg | ORAL | Status: DC | PRN
  Filled 2019-11-30: qty 20.3

## 2019-11-30 NOTE — ED Notes (Signed)
Attempted to call report on this pt for a second time. Informed floor that this pt was coming up at this time since it has bed has been assigned for 30 mins.

## 2019-11-30 NOTE — Procedures (Signed)
ELECTROENCEPHALOGRAM REPORT   Patient: Tanya Knight       Room #: HJ64B EEG No. ID: [id number] Age: 79 y.o.        Sex: female Requesting Physician: [place] Report Date:  11/30/2019        Interpreting Physician: Alexis Goodell  History: Tanya Knight is an 79 y.o. female with syncope  Medications:  Depakote  Conditions of Recording:  This is a 21 channel routine scalp EEG performed with bipolar and monopolar montages arranged in accordance to the international 10/20 system of electrode placement. One channel was dedicated to EKG recording.  The patient is in the awake, drowsy and asleep states.  Description:  The waking background activity consists of a low voltage, symmetrical, fairly well organized, 8 Hz alpha activity, seen from the parieto-occipital and posterior temporal regions.  Low voltage fast activity, poorly organized, is seen anteriorly and is at times superimposed on more posterior regions.  A mixture of theta and alpha rhythms are seen from the central and temporal regions. The patient drowses with slowing to irregular, low voltage theta and beta activity.   The patient goes in to a light sleep with symmetrical sleep spindles, vertex central sharp transients and irregular slow activity.   No epileptiform activity is noted.   Hyperventilation was not performed.  Intermittent photic stimulation was performed but failed to illicit any change in the tracing.     IMPRESSION: Normal electroencephalogram, awake, asleep and with activation procedures. There are no focal lateralizing or epileptiform features.   Alexis Goodell, MD Neurology [phone] 11/30/2019, 1:32 PM

## 2019-11-30 NOTE — Progress Notes (Signed)
eeg completed ° °

## 2019-11-30 NOTE — Progress Notes (Signed)
OT Cancellation Note  Patient Details Name: Tanya Knight MRN: [id number] DOB: 1941/03/28   Cancelled Treatment:    Reason Eval/Treat Not Completed: Other (comment). Consult received and chart reviewed. Pt now with pending imaging that precludes therapy evaluation. Spoke with physical therapist who discussed case with RN who states they are planning for admission to 2A. Will likely hold until imaging complete and pt medically stable. Of note, per secure chat, MD requesting to keep SBP <160. Will continue to follow remotely and initiate services as available and pt medically appropriate.   Shara Blazing, M.S., OTR/L Ascom: [phone] 11/30/19, 3:18 PM

## 2019-11-30 NOTE — Progress Notes (Signed)
PT Cancellation Note  Patient Details Name: BARBY COLVARD MRN: [id number] DOB: Aug 28, 1940   Cancelled Treatment:    Reason Eval/Treat Not Completed: Other (comment). Consult received and chart reviewed. Pt now with pending imaging that precludes therapy evaluation. Discussed with RN who states they are planning for admission to 2A. Will likely hold until imaging complete and pt medically stable. Of note, per secure chat, MD requesting to keep SBP <160.   Djuana Littleton 11/30/2019, 2:06 PM  Greggory Stallion, PT, DPT [phone]

## 2019-11-30 NOTE — Progress Notes (Signed)
*  PRELIMINARY RESULTS* Echocardiogram 2D Echocardiogram has been performed.  Tanya Knight 11/30/2019, 2:54 PM

## 2019-11-30 NOTE — Progress Notes (Signed)
PROGRESS NOTE    AMILLION SCOBEE  YWV:[id number] DOB: 1940/07/10 DOA: 11/29/2019 PCP: Venia Carbon, MD    Brief Narrative:  DEYA BIGOS is a 79 year old Caucasian female with past medical history notable for CVA in January 2021, seizure disorder on Depakote, GERD, essential hypertension, osteoporosis who presented to the ED following fall and syncopal episode around 7 PM.  Patient's family reported that she fell to her right side and when she woke up reported pain in her head and ribs.  History of seizure disorder in the past but was not witnessed to have any seizure type activity today.  And family/patient reports compliance with her Depakote.  Following the syncopal event she did have a hard time speaking lasting between 20-30 minutes with difficulty finding words.  Patient is not on any blood thinners but does take aspirin.  Of note in January patient had a very similar presentation when she had a sudden onset of headache nausea vomiting slurred speech and weakness resulting in the fall.  Work-up showed cerebellar vermis ICH she of SIADH and SAH likely secondary to hypertension.  Her aspirin was held. During that admission CT head showed no AVMs. 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 She had 2D echo done that showed preserved EF and no cardiogenic source of emboli.  In the ED, CT head with right frontal subdural 5 mm with no shift.  Chest x-ray with right-sided rib fractures.  CT chest with displaced/comminuted right fourth/fifth rib fracture, 6/seventh rib fracture, L1 vertebral body fracture.  ED physician discussed with neurosurgery on-call, Dr. Lacinda Axon who recommended admit to medicine and no transfer at this time with repeat CT head in 4 hours.  EDP consulted [facility] for admission.   Assessment & Plan:   Active Problems:   Essential hypertension, benign   GERD   Seizure disorder (HCC)   Hyperlipidemia   Dysphagia, post-stroke    Essential hypertension   Malnutrition of mild degree (HCC)   Subdural hematoma (HCC)   Syncope and collapse   Word finding difficulty   Rib fracture   Fracture of thoracic vertebra, compression (HCC)   Acute right frontal subdural hematoma Patient presenting following fall at home with trauma to head.  Initial CT head with 5 mm right frontal subdural hematoma.  MR brain with 13 mm right frontal subdural hematoma with slight midline shift.  Case was discussed with neurosurgery, Dr. Lacinda Axon who recommended medicine admission and interval CT head scans. --Neurosurgery and neurology following, appreciate assistance --check CT angiogram head for further evaluation of history of aneurysms per neurology recommendations --Check CT C-spine given mechanism of injury; although no C-spine tenderness --Continue to hold aspirin and no NSAIDs per neurosurgery --Repeat CT head in the a.m. --Continue frequent neurochecks --Pending PT/OT/SLP evaluation  Syncope and collapse Unclear cause.  History of seizure disorder in the past.  Consideration of neurological versus cardiogenic event.  EEG without any acute findings.  Carotid ultrasound unrevealing. --Check orthostatic vital signs --Continue to monitor on telemetry  Multiple right rib fractures CT chest notable for multiple right rib fractures fourth/fifth and sixth/seventh. --Avoid NSAIDs as above for SDH --Lidocaine patch --Tylenol as needed --Morphine 1 mg IV every 4 hours as needed severe pain --Incentive spirometry  History of seizure disorder Patient on Depakote 500 mg p.o. twice daily.  Depakote level 52.  Like activity per family.  EEG shows no focal lateralization or epileptiform features. --Continue Depakote 500 mg p.o. twice daily --  Seizure/fall precautions  Essential hypertension Patient on amlodipine 7.5 mg p.o. daily and metoprolol succinate 50 mg p.o. daily at home. BP 156/97. --Restart home amlodipine 7.5 mg p.o. daily --Continue  monitor blood pressure closely   DVT prophylaxis: SCDs, chemical DVT prophylaxis contraindicated in the setting of subdural hematoma Code Status: Full code Family Communication: Updated patient's daughter who is present at bedside  Disposition Plan:  Status is: Observation  The patient remains OBS appropriate and will d/c before 2 midnights.  Dispo: The patient is from: Home              Anticipated d/c is to: Home              Anticipated d/c date is: 2 days              Patient currently is not medically stable to d/c.   Consultants:   Neurology, Dr. Doy Mince  Neurosurgery, Dr. Lacinda Axon, Dr. Izora Ribas  Procedures:   Bilateral ultrasound carotid: IMPRESSION: Color duplex indicates minimal heterogeneous and calcified plaque, with no hemodynamically significant stenosis by duplex criteria in the extracranial cerebrovascular circulation.  Transthoracic echocardiogram: Pending  Antimicrobials:   None   Subjective: Patient seen and examined bedside, resting comfortably with daughter present.  No complaints this morning.  Feels her mentation and speech back to her normal baseline.  Denies headache, no fever/chills/night sweats, no nausea cefonicid/diarrhea, no chest pain, no palpitations, no shortness of breath, no abdominal pain.  No acute events overnight per nursing staff.  Objective: Vitals:   11/30/19 0000 11/30/19 0240 11/30/19 0400 11/30/19 1000  BP: 135/87 (!) 186/95 (!) 156/97 (!) 156/85  Pulse: 62 71 72 64  Resp: (!) 21 20 20 18   Temp:  98.8 F (37.1 C)    TempSrc:  Oral    SpO2: 97% 97% 94% 97%  Weight:      Height:        Intake/Output Summary (Last 24 hours) at 11/30/2019 1340 Last data filed at 11/30/2019 1030 Gross per 24 hour  Intake --  Output 775 ml  Net -775 ml   Filed Weights   11/29/19 2106  Weight: 51.3 kg    Examination:  General exam: Appears calm and comfortable  Respiratory system: Clear to auscultation. Respiratory effort  normal. Cardiovascular system: S1 & S2 heard, RRR. No JVD, murmurs, rubs, gallops or clicks. No pedal edema. Gastrointestinal system: Abdomen is nondistended, soft and nontender. No organomegaly or masses felt. Normal bowel sounds heard. Central nervous system: Alert and oriented. No focal neurological deficits. Extremities: Symmetric 5 x 5 power. Skin: No rashes, lesions or ulcers Psychiatry: Judgement and insight appear normal. Mood & affect appropriate.     Data Reviewed: I have personally reviewed following labs and imaging studies  CBC: Recent Labs  Lab 11/29/19 2115  WBC 6.6  NEUTROABS 4.5  HGB 14.7  HCT 43.6  MCV 92.4  PLT 768   Basic Metabolic Panel: Recent Labs  Lab 11/29/19 2115  NA 133*  K 3.9  CL 94*  CO2 30  GLUCOSE 124*  BUN 20  CREATININE 0.67  CALCIUM 9.7   GFR: Estimated Creatinine Clearance: 43.7 mL/min (by C-G formula based on SCr of 0.67 mg/dL). Liver Function Tests: Recent Labs  Lab 11/29/19 2115  AST 21  ALT 13  ALKPHOS 69  BILITOT 0.7  PROT 7.1  ALBUMIN 4.2   No results for input(s): LIPASE, AMYLASE in the last 168 hours. No results for input(s): AMMONIA in the last 168  hours. Coagulation Profile: Recent Labs  Lab 11/29/19 2115  INR 1.0   Cardiac Enzymes: No results for input(s): CKTOTAL, CKMB, CKMBINDEX, TROPONINI in the last 168 hours. BNP (last 3 results) No results for input(s): PROBNP in the last 8760 hours. HbA1C: No results for input(s): HGBA1C in the last 72 hours. CBG: No results for input(s): GLUCAP in the last 168 hours. Lipid Profile: Recent Labs    11/30/19 0722  CHOL 193  HDL 69  LDLCALC 115*  TRIG 45  CHOLHDL 2.8   Thyroid Function Tests: No results for input(s): TSH, T4TOTAL, FREET4, T3FREE, THYROIDAB in the last 72 hours. Anemia Panel: No results for input(s): VITAMINB12, FOLATE, FERRITIN, TIBC, IRON, RETICCTPCT in the last 72 hours. Sepsis Labs: No results for input(s): PROCALCITON, LATICACIDVEN  in the last 168 hours.  Recent Results (from the past 240 hour(s))  SARS Coronavirus 2 by RT PCR (hospital order, performed in [facility] hospital lab) Nasopharyngeal Nasopharyngeal Swab     Status: None   Collection Time: 11/29/19 10:08 PM   Specimen: Nasopharyngeal Swab  Result Value Ref Range Status   SARS Coronavirus 2 NEGATIVE NEGATIVE Final    Comment: (NOTE) SARS-CoV-2 target nucleic acids are NOT DETECTED.  The SARS-CoV-2 RNA is generally detectable in upper and lower respiratory specimens during the acute phase of infection. The lowest concentration of SARS-CoV-2 viral copies this assay can detect is 250 copies / mL. A negative result does not preclude SARS-CoV-2 infection and should not be used as the sole basis for treatment or other patient management decisions.  A negative result may occur with improper specimen collection / handling, submission of specimen other than nasopharyngeal swab, presence of viral mutation(s) within the areas targeted by this assay, and inadequate number of viral copies (<250 copies / mL). A negative result must be combined with clinical observations, patient history, and epidemiological information.  Fact Sheet for Patients:   StrictlyIdeas.no  Fact Sheet for Healthcare Providers: BankingDealers.co.za  This test is not yet approved or  cleared by the [place] FDA and has been authorized for detection and/or diagnosis of SARS-CoV-2 by FDA under an Emergency Use Authorization (EUA).  This EUA will remain in effect (meaning this test can be used) for the duration of the COVID-19 declaration under Section 564(b)(1) of the Act, 21 U.S.C. section 360bbb-3(b)(1), unless the authorization is terminated or revoked sooner.  Performed at [facility], [address], [place], [place] [address]          Radiology Studies: EEG  Result Date: 11/30/2019 Alexis Goodell, MD      11/30/2019  1:33 PM ELECTROENCEPHALOGRAM REPORT Patient: Tanya Knight       Room #: VW09W EEG No. ID: [id number] Age: 79 y.o.        Sex: female Requesting Physician: [place] Report Date:  11/30/2019       Interpreting Physician: Alexis Goodell History: ALLYSIA INGLES is an 79 y.o. female with syncope Medications: Depakote Conditions of Recording:  This is a 21 channel routine scalp EEG performed with bipolar and monopolar montages arranged in accordance to the international 10/20 system of electrode placement. One channel was dedicated to EKG recording. The patient is in the awake, drowsy and asleep states. Description:  The waking background activity consists of a low voltage, symmetrical, fairly well organized, 8 Hz alpha activity, seen from the parieto-occipital and posterior temporal regions.  Low voltage fast activity, poorly organized, is seen anteriorly and is at times superimposed on more  posterior regions.  A mixture of theta and alpha rhythms are seen from the central and temporal regions. The patient drowses with slowing to irregular, low voltage theta and beta activity.  The patient goes in to a light sleep with symmetrical sleep spindles, vertex central sharp transients and irregular slow activity.  No epileptiform activity is noted.  Hyperventilation was not performed.  Intermittent photic stimulation was performed but failed to illicit any change in the tracing.  IMPRESSION: Normal electroencephalogram, awake, asleep and with activation procedures. There are no focal lateralizing or epileptiform features. Alexis Goodell, MD Neurology [phone] 11/30/2019, 1:32 PM   DG Chest 2 View  Result Date: 11/29/2019 CLINICAL DATA:  Rib pain, fall, EXAM: CHEST - 2 VIEW COMPARISON:  06/15/2019 FINDINGS: There is an acute fracture of the right fourth rib anterolaterally. Several remote right rib fractures are also noted. The lungs are clear. No pneumothorax. Tiny right pleural effusion is present. Small  hiatal hernia is noted. Cardiac size within normal limits. The pulmonary vascularity is normal. There is progressive loss of height involving the T12 compression fracture with new anterior wedge compression fracture of probable T11 since prior examination, though exact localization is difficult due to overlying metallic artifact. IMPRESSION: Acute right fourth rib anterolateral fracture.  No pneumothorax. Multiple thoracolumbar compression fractures, new and progressive since prior examination, age indeterminate. These could be better assessed with MRI examination, particularly if percutaneous vertebral stabilization is considered as a therapeutic option. Electronically Signed   By: Fidela Salisbury MD   On: 11/29/2019 21:34   CT Head Wo Contrast  Result Date: 11/30/2019 CLINICAL DATA:  Fall, recent subdural hemorrhage EXAM: CT HEAD WITHOUT CONTRAST TECHNIQUE: Contiguous axial images were obtained from the base of the skull through the vertex without intravenous contrast. COMPARISON:  MRI brain November 30, 2019 12:56 a.m. FINDINGS: Brain: Again noted is a acute subdural hemorrhage seen overlying the right frontal lobe measuring up to maximum diameter of 8 mm, slightly decreased in size from the recent MRI. There is also a small amount of subdural hemorrhage overlying the superior falx measuring maximum diameter of 3 mm. There is mild mass effect along the right frontal lobe. There is minimal right to leftward shift of 2 mm. There is dilatation the ventricles and sulci consistent with age-related atrophy. Low-attenuation changes in the deep white matter consistent with small vessel ischemia. Area of hypodensity seen within the left cerebellum from prior hemorrhagic stroke. Vascular: No hyperdense vessel or unexpected calcification. Skull: The skull is intact. No fracture or focal lesion identified. Sinuses/Orbits: The visualized paranasal sinuses and mastoid air cells are clear. The orbits and globes intact. Other:  None IMPRESSION: 1. Slight interval decrease in the acute subdural hemorrhage overlying the right frontal convexity and superior falx measuring a maximum diameter of 8 mm. Mild mass effect upon the right frontal lobe. No new extra-axial fluid collections. 2. Findings consistent with age related atrophy and chronic small vessel ischemia Electronically Signed   By: Prudencio Pair M.D.   On: 11/30/2019 03:18   CT HEAD WO CONTRAST  Result Date: 11/29/2019 CLINICAL DATA:  Syncope, loss of consciousness, fell on right side EXAM: CT HEAD WITHOUT CONTRAST TECHNIQUE: Contiguous axial images were obtained from the base of the skull through the vertex without intravenous contrast. COMPARISON:  06/15/2019 FINDINGS: Brain: There is a small right frontal subdural hematoma measuring up to 5 mm in thickness. There is no significant mass effect or midline shift. No acute infarct. Lateral ventricles and midline structures  are stable. Vascular: No hyperdense vessel or unexpected calcification. Skull: Normal. Negative for fracture or focal lesion. Sinuses/Orbits: No acute finding. Other: None. IMPRESSION: 1. Small right frontal subdural hematoma, measuring up to 5 mm in thickness. No significant mass effect or midline shift. These results were called by telephone at the time of interpretation on 11/29/2019 at 9:37 pm to provider [facility] , who verbally acknowledged these results. Electronically Signed   By: Randa Ngo M.D.   On: 11/29/2019 21:37   CT Chest Wo Contrast  Result Date: 11/29/2019 CLINICAL DATA:  Syncopal episode and fall to right side, pain and right ribs EXAM: CT CHEST WITHOUT CONTRAST TECHNIQUE: Multidetector CT imaging of the chest was performed following the standard protocol without IV contrast. COMPARISON:  Radiograph 11/29/2019, CT 01/05/2015 FINDINGS: Cardiovascular: Normal heart size. No pericardial effusion. Extensive coronary artery calcifications are present. Calcifications are present upon the  mitral annulus, chordae tendinae, and aortic leaflets. Atherosclerotic plaque within the normal caliber aorta. No abnormal hyperdense mural thickening or plaque displacement to suggest intramural hematoma. No periaortic stranding or hemorrhage. Shared origin of brachiocephalic and left common carotid artery. Minimal plaque in the proximal great vessels and cervical carotids. Central pulmonary arteries are normal caliber. Luminal evaluation of the vasculature is precluded in the absence of contrast media. Mediastinum/Nodes: Patulous, fluid-filled thoracic esophagus with moderate hiatal hernia. No acute abnormality of the trachea. No mediastinal or axillary adenopathy. Hilar nodal evaluation limited in the absence of contrast media. No mediastinal fluid, hemorrhage or gas. Thyroid gland and thoracic inlet are unremarkable. Lungs/Pleura: Mild subpleural thickening noted adjacent the contiguous right-sided rib fractures. No convincing parenchymal contusion, laceration nor effusion or pneumothorax. Atelectatic changes present in the lungs likely exacerbated by splinting. No consolidation or convincing features of edema. Mild bronchiectatic changes in the medial lung base are new from prior given the esophageal findings could reflect sequela of prior aspiration or bronchitic change. Upper Abdomen: No acute abnormalities present in the visualized portions of the upper abdomen. Extensive atherosclerotic calcification of the upper abdomen. Musculoskeletal: Displaced and mildly comminuted right fourth and fifth rib fractures with additional nondisplaced sixth and seventh acute rib fractures as well. Additional remote anterolateral eighth ninth and posterolateral tenth rib fractures are noted as well. There is a lucency extending through the anterior cortex and endplates of the L1 vertebral body with progressive height loss and increasing sclerosis from comparison CT now with up to 85% height loss anteriorly and some mild  posterior height loss and retropulsion of the superior endplate resulting in moderate to severe canal stenosis. Progressive focal kyphotic curvature at this level. Additional new subacute to chronic appearing anterior compression deformity at T12 with up to 30% height loss anteriorly. Stable compression deformities elsewhere in the spine at T5, T3 and T2. Findings on a background of diffuse multilevel discogenic and facet degenerative changes. IMPRESSION: 1. Displaced and mildly comminuted right fourth and fifth rib fractures with additional nondisplaced sixth and seventh acute rib fractures as well. Mild subpleural thickening adjacent the contiguous right-sided rib fractures. No convincing parenchymal contusion, laceration or effusion or pneumothorax. 2. Additional remote anterolateral eighth ninth and posterolateral tenth rib fractures are noted as well. 3. L1 vertebral body fracture with progressive height loss and increasing sclerosis from comparison CT with a possibly acute on subacute fracture lucency with up to 85% height loss anteriorly and some mild posterior height loss and retropulsion of the superior endplate resulting in moderate to severe canal stenosis. Correlate for point tenderness. 4. Additional  subacute to chronic appearing anterior compression deformity at T12 with up to 30% height loss anteriorly. 5. Stable compression deformities elsewhere in the spine at T5, T3 and T2. 6. Patulous, fluid-filled thoracic esophagus with moderate hiatal hernia. 7. Mild bronchiectatic changes in the medial lung bases are new from prior given the esophageal findings could reflect sequela of prior aspiration. Correlate for aspiration risk. 8. Extensive coronary artery calcifications. 9. Aortic Atherosclerosis (ICD10-I70.0). Electronically Signed   By: Lovena Le M.D.   On: 11/29/2019 22:34   MR BRAIN WO CONTRAST  Result Date: 11/30/2019 CLINICAL DATA:  Initial evaluation for focal neural deficit, history of  fall. EXAM: MRI HEAD WITHOUT CONTRAST TECHNIQUE: Multiplanar, multiecho pulse sequences of the brain and surrounding structures were obtained without intravenous contrast. COMPARISON:  Comparison made with prior CT from 11/29/2019 as well as previous MRI from 06/11/2019. FINDINGS: Brain: Generalized age-related cerebral atrophy. Patchy T2/FLAIR hyperintensity within the periventricular deep white matter both cerebral hemispheres as well as the pons, most consistent with chronic small vessel ischemic disease, mild to moderate in nature. There has been interval evolution of previously seen parenchymal hemorrhage involving the left cerebellum and cerebellar vermis, now chronic in appearance. Associated chronic hemosiderin staining seen throughout this region. Scattered foci of cystic encephalomalacia with no definite underlying lesion as previously question on prior MRI. Associated mild ex vacuo dilatation of the fourth ventricle. Small amount of chronic hemosiderin staining also noted at the posterior left temporoparietal region. Previously identified acute subdural hematoma again seen overlying the right cerebral convexity, most pronounced at the right frontal lobe. This measures up to 13 mm in maximal thickness at the level of the right frontal convexity with mild mass effect on the adjacent right frontal lobe. No significant midline shift. Extension along the falx with a small 4 mm parafalcine component noted as well. No evidence for acute or subacute infarct. Gray-white matter differentiation otherwise maintained. No other areas of chronic cortical infarction. No mass lesion. Ventricles normal size without hydrocephalus. Cavum et septum pellucidum noted. Vascular: Major intracranial vascular flow voids are maintained. Skull and upper cervical spine: Craniocervical junction within normal limits. Bone marrow signal intensity normal. No visible scalp soft tissue abnormality. Sinuses/Orbits: Patient status post  bilateral ocular lens replacement. Globes orbital soft tissues demonstrate no acute finding. Paranasal sinuses are largely clear. No significant mastoid effusion. Inner ear structures grossly normal. Other: None. IMPRESSION: 1. Acute subdural hematoma overlying the right cerebral convexity, measuring up to 13 mm in maximal thickness at the level of the right frontal convexity. Mild mass effect on the subjacent right frontal lobe without significant midline shift. 2. No other acute intracranial abnormality. 3. Interval evolution of previously seen parenchymal hemorrhage involving the left cerebellum and cerebellar vermis, now chronic in appearance. Scattered foci of cystic encephalomalacia without definite underlying lesion as previously questioned on prior MRI. 4. Underlying age-related cerebral atrophy with mild-to-moderate chronic small vessel ischemic disease. Electronically Signed   By: Jeannine Boga M.D.   On: 11/30/2019 02:03   US Carotid Bilateral (at [facility] and [facility] only)  Result Date: 11/30/2019 CLINICAL DATA:  79 year old female with a history of syncope EXAM: BILATERAL CAROTID DUPLEX ULTRASOUND TECHNIQUE: Pearline Cables scale imaging, color Doppler and duplex ultrasound were performed of bilateral carotid and vertebral arteries in the neck. COMPARISON:  None. FINDINGS: Criteria: Quantification of carotid stenosis is based on velocity parameters that correlate the residual internal carotid diameter with NASCET-based stenosis levels, using the diameter of the distal internal carotid lumen as the  denominator for stenosis measurement. The following velocity measurements were obtained: RIGHT ICA:  Systolic 63 cm/sec, Diastolic 21 cm/sec CCA:  59 cm/sec SYSTOLIC ICA/CCA RATIO:  1.5 ECA:  48 cm/sec LEFT ICA:  Systolic 75 cm/sec, Diastolic 19 cm/sec CCA:  72 cm/sec SYSTOLIC ICA/CCA RATIO:  1.6 ECA:  60 cm/sec Right Brachial SBP: Not acquired Left Brachial SBP: Not acquired RIGHT CAROTID ARTERY: No significant  calcifications of the right common carotid artery. Intermediate waveform maintained. Heterogeneous and partially calcified plaque at the right carotid bifurcation. No significant lumen shadowing. Low resistance waveform of the right ICA. No significant tortuosity. RIGHT VERTEBRAL ARTERY: Antegrade flow with low resistance waveform. LEFT CAROTID ARTERY: No significant calcifications of the left common carotid artery. Intermediate waveform maintained. Heterogeneous and partially calcified plaque at the left carotid bifurcation without significant lumen shadowing. Low resistance waveform of the left ICA. No significant tortuosity. LEFT VERTEBRAL ARTERY:  Antegrade flow with low resistance waveform. IMPRESSION: Color duplex indicates minimal heterogeneous and calcified plaque, with no hemodynamically significant stenosis by duplex criteria in the extracranial cerebrovascular circulation. Signed, Dulcy Fanny. Dellia Nims, RPVI [facility] Electronically Signed   By: Corrie Mckusick D.O.   On: 11/30/2019 07:58        Scheduled Meds: . divalproex  500 mg Oral BID   Continuous Infusions: . sodium chloride 75 mL/hr at 11/30/19 0243     LOS: 0 days    Time spent: 38 minutes spent on chart review, discussion with nursing staff, consultants, updating family and interview/physical exam; more than 50% of that time was spent in counseling and/or coordination of care.    Hildagarde Holleran J [place] DO [facility] Available via Epic secure chat 7am-7pm After these hours, please refer to coverage provider listed on [email or web address] 11/30/2019, 1:40 PM

## 2019-11-30 NOTE — Progress Notes (Addendum)
[id number] Cancellation Note  Patient Details Name: RABIAH GOESER MRN: [id number] DOB: 11-20-1940   Cancelled treatment:       Reason Eval/Treat Not Completed: SLP screened, no needs identified, will sign off (chart reviewed; consulted NSG then met w/ pt in room). Pt conversed in general conversation w/ SLP and NSG/family present in room; followed all instructions and was A/O x3. Speech was clear. Pt chose meal items w/ SLP from menu options. Per chart notes, pt received ST therapy at [facility] w/ d/c 07/12/2019 w/  mild cognitive deficits (primarily in the areas of problem solving and recall) still present but overall full improvement/achievement w/ POC goals; she was eating/drinking a Regular diet then. Also noted per MRI: Underlying age-related cerebral atrophy with mild-to-moderate chronic small vessel ischemic disease which might impact Cognition. No overt speech-language deficits noted at this time. ST services will sign off w/ NSG to reconsult if any new needs arise. Pt should f/u w/ PCP if any new Cognitive-linguistic changes from previous baseline are noted upon return to home in usual ADLs. Pt/family agreed.     Orinda Kenner, MS, CCC-SLP Ashmi Blas 11/30/2019, 4:11 PM

## 2019-11-30 NOTE — Consult Note (Signed)
Reason for Consult:Syncope Requesting Physician: [place]  CC: Syncope  I have been asked by Dr. [place] to see this patient in consultation for possible seizure.  HPI: Tanya Knight is an 79 y.o. female with medical history significant of hemorrhagic cerebellar CVA in 05/2019, seizure disorder on depakote, GERD, hypertension, osteoporosis who presented after a syncopal episode that happened around 7 PM on yesterday.  Patient reports having no warning but feeling like the previous episode of seizure that she has had.  Family noticed that she lost consciousness and fell to her right side.  After the syncopal event she did have a hard time speaking lasting between 20 to 30 minutes.   In January patient had sudden onset of headache, nausea, vomiting, slurred speech and weakness resulting in the fall.  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  During that admission CTA head showed 2 mm supraclinoid R ICA aneurysm.i  Past Medical History:  Diagnosis Date  . Fracture of metatarsal    Repair of fractured R metatarsal --Dr Duda---4/08  . GERD (gastroesophageal reflux disease)   . Hypertension   . Melanoma in situ [facility]) 7/17   Dr Kellie Moor  . Osteoporosis   . Seizure disorder ([place])   . Seizures ([place])   . Stroke ([place]) 06/2019  . Vitamin D deficiency     Past Surgical History:  Procedure Laterality Date  . CATARACT EXTRACTION, BILATERAL Bilateral 2014  . EYE SURGERY     Obstructed tear duct  . FOOT SURGERY  2008  . MELANOMA EXCISION Left 11/2015   left lower leg  . TEAR DUCT PROBING  10/12   temporary stent  . TONSILLECTOMY AND ADENOIDECTOMY  1948    Family History  Problem Relation Age of Onset  . Hypertension Mother   . Heart disease Father   . Breast cancer Daughter 81  . Diabetes Maternal Aunt   . Coronary artery disease Paternal Aunt   . Breast cancer Paternal Aunt   . Coronary artery disease Paternal Uncle   . Heart disease Maternal Grandmother   . Heart disease Maternal  Grandfather   . Heart disease Paternal Grandmother   . Heart disease Paternal Grandfather   . Colon cancer Neg Hx   . Stomach cancer Neg Hx   . Pancreatic cancer Neg Hx   . Rectal cancer Neg Hx     Social History:  reports that she has never smoked. She has never used smokeless tobacco. She reports current alcohol use. She reports that she does not use drugs.  Allergies  Allergen Reactions  . Phenytoin     Other reaction(s): Other (See Comments) Other Reaction: Other reaction REACTION: severe reaction  . Septra [Sulfamethoxazole-Trimethoprim]     Nausea, trouble eating and drinking    Medications: I have reviewed the patient's current medications. Prior to Admission medications   Medication Sig Start Date End Date Taking? Authorizing Provider  amLODipine (NORVASC) 2.5 MG tablet Take 3 tablets (7.5 mg total) by mouth daily. Patient taking differently: Take 7.5 mg by mouth every evening.  11/07/19  Yes Venia Carbon, MD  aspirin 81 MG EC tablet Take 81 mg by mouth daily. Swallow whole.   Yes [provider]  divalproex (DEPAKOTE) 500 MG DR tablet Take 1 tablet (500 mg total) by mouth 2 (two) times daily. 11/07/19  Yes Venia Carbon, MD  metoprolol succinate (TOPROL-XL) 50 MG 24 hr tablet Take 1 tablet (50 mg total) by mouth daily. Take with or immediately following a meal. Patient  taking differently: Take 50 mg by mouth every evening. Take with or immediately following a meal. 11/07/19  Yes Venia Carbon, MD  Multiple Vitamin (MULTIVITAMIN) capsule Take 1 capsule by mouth daily.     Yes [provider]  cholecalciferol (VITAMIN D) 1000 units tablet Take 1 tablet (1,000 Units total) by mouth daily. Patient not taking: Reported on 11/30/2019 07/12/19   Angiulli, Lavon Paganini, PA-C    ROS: History obtained from the patient  General ROS: negative for - chills, fatigue, fever, night sweats, weight gain or weight loss Psychological ROS: negative for - behavioral  disorder, hallucinations, memory difficulties, mood swings or suicidal ideation Ophthalmic ROS: negative for - blurry vision, double vision, eye pain or loss of vision ENT ROS: negative for - epistaxis, nasal discharge, oral lesions, sore throat, tinnitus or vertigo Allergy and Immunology ROS: negative for - hives or itchy/watery eyes Hematological and Lymphatic ROS: negative for - bleeding problems, bruising or swollen lymph nodes Endocrine ROS: negative for - galactorrhea, hair pattern changes, polydipsia/polyuria or temperature intolerance Respiratory ROS: negative for - cough, hemoptysis, shortness of breath or wheezing Cardiovascular ROS: negative for - chest pain, dyspnea on exertion, edema or irregular heartbeat Gastrointestinal ROS: negative for - abdominal pain, diarrhea, hematemesis, nausea/vomiting or stool incontinence Genito-Urinary ROS: negative for - dysuria, hematuria, incontinence or urinary frequency/urgency Musculoskeletal ROS: rib pain Neurological ROS: as noted in HPI, headache Dermatological ROS: negative for rash and skin lesion changes  Physical Examination: Blood pressure (!) 156/85, pulse 64, temperature 98.8 F (37.1 C), temperature source Oral, resp. rate 18, height 5\' 1"  (1.549 m), weight 51.3 kg, SpO2 97 %.  HEENT-  Normocephalic, no lesions, without obvious abnormality.  Normal external eye and conjunctiva.  Normal TM's bilaterally.  Normal auditory canals and external ears. Normal external nose, mucus membranes and septum.  Normal pharynx. Cardiovascular- S1, S2 normal, pulses palpable throughout   Lungs- chest clear, no wheezing, rales, normal symmetric air entry Abdomen- soft, non-tender; bowel sounds normal; no masses,  no organomegaly Extremities- no edema Lymph-no adenopathy palpable Musculoskeletal-no joint tenderness, deformity or swelling Skin-warm and dry, no hyperpigmentation, vitiligo, or suspicious lesions  Neurological Examination   Mental  Status: Alert, oriented, thought content appropriate.  Speech fluent without evidence of aphasia.  Able to follow 3 step commands without difficulty. Cranial Nerves: II: Discs flat bilaterally; Visual fields grossly normal, pupils equal, round, reactive to light and accommodation III,IV, VI: ptosis not present, extra-ocular motions intact bilaterally V,VII: smile symmetric, facial light touch sensation normal bilaterally VIII: hearing normal bilaterally IX,X: gag reflex present XI: bilateral shoulder shrug XII: midline tongue extension Motor: Right : Upper extremity   5/5    Left:     Upper extremity   5/5  Lower extremity   5/5     Lower extremity   5/5 Tone and bulk:normal tone throughout; no atrophy noted Sensory: Pinprick and light touch intact throughout, bilaterally Deep Tendon Reflexes: Symmetric throughout Plantars: Right: downgoing   Left: downgoing Cerebellar: Normal finger-to-nose and normal heel-to-shin testing bilaterally Gait: not tested due to safety concerns   Laboratory Studies:   Basic Metabolic Panel: Recent Labs  Lab 11/29/19 2115  NA 133*  K 3.9  CL 94*  CO2 30  GLUCOSE 124*  BUN 20  CREATININE 0.67  CALCIUM 9.7    Liver Function Tests: Recent Labs  Lab 11/29/19 2115  AST 21  ALT 13  ALKPHOS 69  BILITOT 0.7  PROT 7.1  ALBUMIN 4.2   No  results for input(s): LIPASE, AMYLASE in the last 168 hours. No results for input(s): AMMONIA in the last 168 hours.  CBC: Recent Labs  Lab 11/29/19 2115  WBC 6.6  NEUTROABS 4.5  HGB 14.7  HCT 43.6  MCV 92.4  PLT 246    Cardiac Enzymes: No results for input(s): CKTOTAL, CKMB, CKMBINDEX, TROPONINI in the last 168 hours.  BNP: Invalid input(s): POCBNP  CBG: No results for input(s): GLUCAP in the last 168 hours.  Microbiology: Results for orders placed or performed during the hospital encounter of 11/29/19  SARS Coronavirus 2 by RT PCR (hospital order, performed in [facility] hospital lab)  Nasopharyngeal Nasopharyngeal Swab     Status: None   Collection Time: 11/29/19 10:08 PM   Specimen: Nasopharyngeal Swab  Result Value Ref Range Status   SARS Coronavirus 2 NEGATIVE NEGATIVE Final    Comment: (NOTE) SARS-CoV-2 target nucleic acids are NOT DETECTED.  The SARS-CoV-2 RNA is generally detectable in upper and lower respiratory specimens during the acute phase of infection. The lowest concentration of SARS-CoV-2 viral copies this assay can detect is 250 copies / mL. A negative result does not preclude SARS-CoV-2 infection and should not be used as the sole basis for treatment or other patient management decisions.  A negative result may occur with improper specimen collection / handling, submission of specimen other than nasopharyngeal swab, presence of viral mutation(s) within the areas targeted by this assay, and inadequate number of viral copies (<250 copies / mL). A negative result must be combined with clinical observations, patient history, and epidemiological information.  Fact Sheet for Patients:   StrictlyIdeas.no  Fact Sheet for Healthcare Providers: BankingDealers.co.za  This test is not yet approved or  cleared by the [place] FDA and has been authorized for detection and/or diagnosis of SARS-CoV-2 by FDA under an Emergency Use Authorization (EUA).  This EUA will remain in effect (meaning this test can be used) for the duration of the COVID-19 declaration under Section 564(b)(1) of the Act, 21 U.S.C. section 360bbb-3(b)(1), unless the authorization is terminated or revoked sooner.  Performed at [facility], [address] [place], [place] [address]     Coagulation Studies: Recent Labs    11/29/19 08/08/13  LABPROT 12.5  INR 1.0    Urinalysis:  Recent Labs  Lab 11/29/19 2334  COLORURINE YELLOW*  LABSPEC 1.013  PHURINE 7.0  GLUCOSEU NEGATIVE  HGBUR NEGATIVE  BILIRUBINUR NEGATIVE   KETONESUR 5*  PROTEINUR NEGATIVE  NITRITE NEGATIVE  LEUKOCYTESUR NEGATIVE    Lipid Panel:     Component Value Date/Time   CHOL 193 11/30/2019 0722   TRIG 45 11/30/2019 0722   HDL 69 11/30/2019 0722   CHOLHDL 2.8 11/30/2019 0722   VLDL 9 11/30/2019 0722   LDLCALC 115 (H) 11/30/2019 0722    HgbA1C:  Lab Results  Component Value Date   HGBA1C 5.1 06/11/2019    Urine Drug Screen:      Component Value Date/Time   LABOPIA NONE DETECTED 06/10/2019 0623   COCAINSCRNUR NONE DETECTED 06/10/2019 0623   LABBENZ NONE DETECTED 06/10/2019 0623   AMPHETMU NONE DETECTED 06/10/2019 0623   THCU NONE DETECTED 06/10/2019 0623   LABBARB NONE DETECTED 06/10/2019 0623    Alcohol Level: No results for input(s): ETH in the last 168 hours.  Other results: EKG: sinus rhythm at 63 bpm.  Imaging: DG Chest 2 View  Result Date: 11/29/2019 CLINICAL DATA:  Rib pain, fall, EXAM: CHEST - 2 VIEW COMPARISON:  06/15/2019  FINDINGS: There is an acute fracture of the right fourth rib anterolaterally. Several remote right rib fractures are also noted. The lungs are clear. No pneumothorax. Tiny right pleural effusion is present. Small hiatal hernia is noted. Cardiac size within normal limits. The pulmonary vascularity is normal. There is progressive loss of height involving the T12 compression fracture with new anterior wedge compression fracture of probable T11 since prior examination, though exact localization is difficult due to overlying metallic artifact. IMPRESSION: Acute right fourth rib anterolateral fracture.  No pneumothorax. Multiple thoracolumbar compression fractures, new and progressive since prior examination, age indeterminate. These could be better assessed with MRI examination, particularly if percutaneous vertebral stabilization is considered as a therapeutic option. Electronically Signed   By: Fidela Salisbury MD   On: 11/29/2019 21:34   CT Head Wo Contrast  Result Date: 11/30/2019 CLINICAL  DATA:  Fall, recent subdural hemorrhage EXAM: CT HEAD WITHOUT CONTRAST TECHNIQUE: Contiguous axial images were obtained from the base of the skull through the vertex without intravenous contrast. COMPARISON:  MRI brain November 30, 2019 12:56 a.m. FINDINGS: Brain: Again noted is a acute subdural hemorrhage seen overlying the right frontal lobe measuring up to maximum diameter of 8 mm, slightly decreased in size from the recent MRI. There is also a small amount of subdural hemorrhage overlying the superior falx measuring maximum diameter of 3 mm. There is mild mass effect along the right frontal lobe. There is minimal right to leftward shift of 2 mm. There is dilatation the ventricles and sulci consistent with age-related atrophy. Low-attenuation changes in the deep white matter consistent with small vessel ischemia. Area of hypodensity seen within the left cerebellum from prior hemorrhagic stroke. Vascular: No hyperdense vessel or unexpected calcification. Skull: The skull is intact. No fracture or focal lesion identified. Sinuses/Orbits: The visualized paranasal sinuses and mastoid air cells are clear. The orbits and globes intact. Other: None IMPRESSION: 1. Slight interval decrease in the acute subdural hemorrhage overlying the right frontal convexity and superior falx measuring a maximum diameter of 8 mm. Mild mass effect upon the right frontal lobe. No new extra-axial fluid collections. 2. Findings consistent with age related atrophy and chronic small vessel ischemia Electronically Signed   By: Prudencio Pair M.D.   On: 11/30/2019 03:18   CT HEAD WO CONTRAST  Result Date: 11/29/2019 CLINICAL DATA:  Syncope, loss of consciousness, fell on right side EXAM: CT HEAD WITHOUT CONTRAST TECHNIQUE: Contiguous axial images were obtained from the base of the skull through the vertex without intravenous contrast. COMPARISON:  06/15/2019 FINDINGS: Brain: There is a small right frontal subdural hematoma measuring up to 5 mm in  thickness. There is no significant mass effect or midline shift. No acute infarct. Lateral ventricles and midline structures are stable. Vascular: No hyperdense vessel or unexpected calcification. Skull: Normal. Negative for fracture or focal lesion. Sinuses/Orbits: No acute finding. Other: None. IMPRESSION: 1. Small right frontal subdural hematoma, measuring up to 5 mm in thickness. No significant mass effect or midline shift. These results were called by telephone at the time of interpretation on 11/29/2019 at 9:37 pm to provider [facility] , who verbally acknowledged these results. Electronically Signed   By: Randa Ngo M.D.   On: 11/29/2019 21:37   CT Chest Wo Contrast  Result Date: 11/29/2019 CLINICAL DATA:  Syncopal episode and fall to right side, pain and right ribs EXAM: CT CHEST WITHOUT CONTRAST TECHNIQUE: Multidetector CT imaging of the chest was performed following the standard protocol without IV  contrast. COMPARISON:  Radiograph 11/29/2019, CT 01/05/2015 FINDINGS: Cardiovascular: Normal heart size. No pericardial effusion. Extensive coronary artery calcifications are present. Calcifications are present upon the mitral annulus, chordae tendinae, and aortic leaflets. Atherosclerotic plaque within the normal caliber aorta. No abnormal hyperdense mural thickening or plaque displacement to suggest intramural hematoma. No periaortic stranding or hemorrhage. Shared origin of brachiocephalic and left common carotid artery. Minimal plaque in the proximal great vessels and cervical carotids. Central pulmonary arteries are normal caliber. Luminal evaluation of the vasculature is precluded in the absence of contrast media. Mediastinum/Nodes: Patulous, fluid-filled thoracic esophagus with moderate hiatal hernia. No acute abnormality of the trachea. No mediastinal or axillary adenopathy. Hilar nodal evaluation limited in the absence of contrast media. No mediastinal fluid, hemorrhage or gas. Thyroid gland  and thoracic inlet are unremarkable. Lungs/Pleura: Mild subpleural thickening noted adjacent the contiguous right-sided rib fractures. No convincing parenchymal contusion, laceration nor effusion or pneumothorax. Atelectatic changes present in the lungs likely exacerbated by splinting. No consolidation or convincing features of edema. Mild bronchiectatic changes in the medial lung base are new from prior given the esophageal findings could reflect sequela of prior aspiration or bronchitic change. Upper Abdomen: No acute abnormalities present in the visualized portions of the upper abdomen. Extensive atherosclerotic calcification of the upper abdomen. Musculoskeletal: Displaced and mildly comminuted right fourth and fifth rib fractures with additional nondisplaced sixth and seventh acute rib fractures as well. Additional remote anterolateral eighth ninth and posterolateral tenth rib fractures are noted as well. There is a lucency extending through the anterior cortex and endplates of the L1 vertebral body with progressive height loss and increasing sclerosis from comparison CT now with up to 85% height loss anteriorly and some mild posterior height loss and retropulsion of the superior endplate resulting in moderate to severe canal stenosis. Progressive focal kyphotic curvature at this level. Additional new subacute to chronic appearing anterior compression deformity at T12 with up to 30% height loss anteriorly. Stable compression deformities elsewhere in the spine at T5, T3 and T2. Findings on a background of diffuse multilevel discogenic and facet degenerative changes. IMPRESSION: 1. Displaced and mildly comminuted right fourth and fifth rib fractures with additional nondisplaced sixth and seventh acute rib fractures as well. Mild subpleural thickening adjacent the contiguous right-sided rib fractures. No convincing parenchymal contusion, laceration or effusion or pneumothorax. 2. Additional remote anterolateral  eighth ninth and posterolateral tenth rib fractures are noted as well. 3. L1 vertebral body fracture with progressive height loss and increasing sclerosis from comparison CT with a possibly acute on subacute fracture lucency with up to 85% height loss anteriorly and some mild posterior height loss and retropulsion of the superior endplate resulting in moderate to severe canal stenosis. Correlate for point tenderness. 4. Additional subacute to chronic appearing anterior compression deformity at T12 with up to 30% height loss anteriorly. 5. Stable compression deformities elsewhere in the spine at T5, T3 and T2. 6. Patulous, fluid-filled thoracic esophagus with moderate hiatal hernia. 7. Mild bronchiectatic changes in the medial lung bases are new from prior given the esophageal findings could reflect sequela of prior aspiration. Correlate for aspiration risk. 8. Extensive coronary artery calcifications. 9. Aortic Atherosclerosis (ICD10-I70.0). Electronically Signed   By: Lovena Le M.D.   On: 11/29/2019 22:34   MR BRAIN WO CONTRAST  Result Date: 11/30/2019 CLINICAL DATA:  Initial evaluation for focal neural deficit, history of fall. EXAM: MRI HEAD WITHOUT CONTRAST TECHNIQUE: Multiplanar, multiecho pulse sequences of the brain and surrounding structures were obtained  without intravenous contrast. COMPARISON:  Comparison made with prior CT from 11/29/2019 as well as previous MRI from 06/11/2019. FINDINGS: Brain: Generalized age-related cerebral atrophy. Patchy T2/FLAIR hyperintensity within the periventricular deep white matter both cerebral hemispheres as well as the pons, most consistent with chronic small vessel ischemic disease, mild to moderate in nature. There has been interval evolution of previously seen parenchymal hemorrhage involving the left cerebellum and cerebellar vermis, now chronic in appearance. Associated chronic hemosiderin staining seen throughout this region. Scattered foci of cystic  encephalomalacia with no definite underlying lesion as previously question on prior MRI. Associated mild ex vacuo dilatation of the fourth ventricle. Small amount of chronic hemosiderin staining also noted at the posterior left temporoparietal region. Previously identified acute subdural hematoma again seen overlying the right cerebral convexity, most pronounced at the right frontal lobe. This measures up to 13 mm in maximal thickness at the level of the right frontal convexity with mild mass effect on the adjacent right frontal lobe. No significant midline shift. Extension along the falx with a small 4 mm parafalcine component noted as well. No evidence for acute or subacute infarct. Gray-white matter differentiation otherwise maintained. No other areas of chronic cortical infarction. No mass lesion. Ventricles normal size without hydrocephalus. Cavum et septum pellucidum noted. Vascular: Major intracranial vascular flow voids are maintained. Skull and upper cervical spine: Craniocervical junction within normal limits. Bone marrow signal intensity normal. No visible scalp soft tissue abnormality. Sinuses/Orbits: Patient status post bilateral ocular lens replacement. Globes orbital soft tissues demonstrate no acute finding. Paranasal sinuses are largely clear. No significant mastoid effusion. Inner ear structures grossly normal. Other: None. IMPRESSION: 1. Acute subdural hematoma overlying the right cerebral convexity, measuring up to 13 mm in maximal thickness at the level of the right frontal convexity. Mild mass effect on the subjacent right frontal lobe without significant midline shift. 2. No other acute intracranial abnormality. 3. Interval evolution of previously seen parenchymal hemorrhage involving the left cerebellum and cerebellar vermis, now chronic in appearance. Scattered foci of cystic encephalomalacia without definite underlying lesion as previously questioned on prior MRI. 4. Underlying age-related  cerebral atrophy with mild-to-moderate chronic small vessel ischemic disease. Electronically Signed   By: Jeannine Boga M.D.   On: 11/30/2019 02:03   US Carotid Bilateral (at [facility] and [facility] only)  Result Date: 11/30/2019 CLINICAL DATA:  79 year old female with a history of syncope EXAM: BILATERAL CAROTID DUPLEX ULTRASOUND TECHNIQUE: Pearline Cables scale imaging, color Doppler and duplex ultrasound were performed of bilateral carotid and vertebral arteries in the neck. COMPARISON:  None. FINDINGS: Criteria: Quantification of carotid stenosis is based on velocity parameters that correlate the residual internal carotid diameter with NASCET-based stenosis levels, using the diameter of the distal internal carotid lumen as the denominator for stenosis measurement. The following velocity measurements were obtained: RIGHT ICA:  Systolic 63 cm/sec, Diastolic 21 cm/sec CCA:  59 cm/sec SYSTOLIC ICA/CCA RATIO:  1.5 ECA:  48 cm/sec LEFT ICA:  Systolic 75 cm/sec, Diastolic 19 cm/sec CCA:  72 cm/sec SYSTOLIC ICA/CCA RATIO:  1.6 ECA:  60 cm/sec Right Brachial SBP: Not acquired Left Brachial SBP: Not acquired RIGHT CAROTID ARTERY: No significant calcifications of the right common carotid artery. Intermediate waveform maintained. Heterogeneous and partially calcified plaque at the right carotid bifurcation. No significant lumen shadowing. Low resistance waveform of the right ICA. No significant tortuosity. RIGHT VERTEBRAL ARTERY: Antegrade flow with low resistance waveform. LEFT CAROTID ARTERY: No significant calcifications of the left common carotid artery. Intermediate waveform maintained. Heterogeneous  and partially calcified plaque at the left carotid bifurcation without significant lumen shadowing. Low resistance waveform of the left ICA. No significant tortuosity. LEFT VERTEBRAL ARTERY:  Antegrade flow with low resistance waveform. IMPRESSION: Color duplex indicates minimal heterogeneous and calcified plaque, with no  hemodynamically significant stenosis by duplex criteria in the extracranial cerebrovascular circulation. Signed, Dulcy Fanny. Dellia Nims, RPVI [facility] Radiology Electronically Signed   By: Corrie Mckusick D.O.   On: 11/30/2019 07:58     Assessment/Plan:  79 y.o. female with medical history significant of hemorrhagic cerebellar CVA in 05/2019, seizure disorder on depakote, GERD, hypertension, osteoporosis who presented after a syncopal episode that happened around 7 PM on yesterday.  Patient reports having no warning but feeling like the previous episode of seizure that she has had.  Family noticed that she lost consciousness and fell to her right side.  After the syncopal event she did have a hard time speaking lasting between 20 to 30 minutes.   In January patient had sudden onset of headache, nausea, vomiting, slurred speech and weakness resulting in the fall.  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  During that admission CTA head showed 2 mm supraclinoid R ICA aneurysm. Differential for current syncopal event is large but does include possibility of breakthrough seizure activity.  Unclear why this would happen after decades of being seizure free.  Further work up recommended to rule out other possible causes including CVA, arrhythmia, orthostasis.  MRI of the brain performed and personally reviewed.  No evidence of acute infarct.  Right subdural hematoma noted.  Follow up CT imaging shows improvement in SDH.  Recommendations: 1. CTA of head for f/u of aneurysms.  No intervention required if size is stable. 2. Orthostatic vitals 3. EEG 4. Depakote level 5. Telemetry  Alexis Goodell, MD Neurology [phone] 11/30/2019, 12:59 PM

## 2019-11-30 NOTE — ED Notes (Signed)
Pt purewick became dislodge. Pt had full linen change with pericare. Replaced purewick at this time. Due to the moving pt is c/o pain. Requesting tylenol at this time.

## 2019-11-30 NOTE — Consult Note (Signed)
Referring Physician:  No referring provider defined for this encounter.  Primary Physician:  Venia Carbon, MD  Chief Complaint:  Syncopal event and fall, resultant SDH  History of Present Illness: Tanya Knight is a 79 y.o. female with past medical history significant for hemorrhagic cerebellar CVA in 05/2019, seizure disorder on depakote,GERD, hypertension, osteoporosis  who presents with the chief complaint of fall. She reports that at approx 7pm last night, she "went out", she has had a seizure in the past and reports that it felt similar, she lost consciousness and her husband noted she was having word finding difficulty afterwards. She presented to the [facility] ED and head CT was completed which showed "IMPRESSION: 1. Small right frontal subdural hematoma, measuring up to 5 mm in thickness. No significant mass effect or midline shift."  Neurosurgery has been consulted due to SDH.  Of note, in January of this year she had a ICH and CTA head did show a 33mm supraclinoid R ICA aneurysm.    She has been evaluated by neurology.   Neurology saw her: Assessment/Plan:  80 y.o. female with medical history significant of hemorrhagic cerebellar CVA in 05/2019, seizure disorder on depakote,GERD, hypertension, osteoporosis who presented after a syncopal episode that happened around 7 PM on yesterday.  Patient reports having no warning but feeling like the previous episode of seizure that she has had.  Family noticed that she lost consciousness and fell to her right side.  After the syncopal event she did have a hard time speaking lasting between 20 to 30 minutes. In January patient had sudden onset of headache, nausea, vomiting, slurred speech and weakness resulting in the fall. 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  During that admission CTA head showed 2 mm supraclinoid R ICA aneurysm. Differential for current syncopal event is large but does include possibility of breakthrough seizure  activity.  Unclear why this would happen after decades of being seizure free.  Further work up recommended to rule out other possible causes including CVA, arrhythmia, orthostasis.  MRI of the brain performed and personally reviewed.  No evidence of acute infarct.  Right subdural hematoma noted.  Follow up CT imaging shows improvement in SDH.  The symptoms are causing a significant impact on the patient's life.   Review of Systems:  A 10 point review of systems is negative, except for the pertinent positives and negatives detailed in the HPI.  Past Medical History: Past Medical History:  Diagnosis Date  . Fracture of metatarsal    Repair of fractured R metatarsal --Dr Duda---4/08  . GERD (gastroesophageal reflux disease)   . Hypertension   . Melanoma in situ [facility]) 7/17   Dr Kellie Moor  . Osteoporosis   . Seizure disorder ([place])   . Seizures ([place])   . Stroke ([place]) 06/2019  . Vitamin D deficiency     Past Surgical History: Past Surgical History:  Procedure Laterality Date  . CATARACT EXTRACTION, BILATERAL Bilateral 2014  . EYE SURGERY     Obstructed tear duct  . FOOT SURGERY  2008  . MELANOMA EXCISION Left 11/2015   left lower leg  . TEAR DUCT PROBING  10/12   temporary stent  . TONSILLECTOMY AND ADENOIDECTOMY  1948    Allergies: Allergies as of 11/29/2019 - Review Complete 11/07/2019  Allergen Reaction Noted  . Phenytoin  02/11/2007  . Septra [sulfamethoxazole-trimethoprim]  11/10/2019    Medications:  Current Facility-Administered Medications:  .  0.9 %  sodium chloride infusion, , Intravenous, Continuous, Doutova,  Anastassia, MD, Last Rate: 75 mL/hr at 11/30/19 0243, New Bag at 11/30/19 0243 .  acetaminophen (TYLENOL) tablet 650 mg, 650 mg, Oral, Q4H PRN, 650 mg at 11/30/19 1033 **OR** acetaminophen (TYLENOL) 160 MG/5ML solution 650 mg, 650 mg, Per Tube, Q4H PRN **OR** acetaminophen (TYLENOL) suppository 650 mg, 650 mg, Rectal, Q4H PRN, Doutova, Anastassia, MD .   divalproex (DEPAKOTE) DR tablet 500 mg, 500 mg, Oral, BID, Doutova, Anastassia, MD, 500 mg at 11/30/19 0920 .  LORazepam (ATIVAN) injection 2 mg, 2 mg, Intravenous, PRN, Sharion Settler, NP .  morphine 2 MG/ML injection 1 mg, 1 mg, Intravenous, Q4H PRN, Doutova, Anastassia, MD .  ondansetron (ZOFRAN) injection 4 mg, 4 mg, Intravenous, Q6H PRN, Sharion Settler, NP, 4 mg at 11/30/19 0331 .  senna-docusate (Senokot-S) tablet 1 tablet, 1 tablet, Oral, QHS PRN, Doutova, Anastassia, MD  Current Outpatient Medications:  .  amLODipine (NORVASC) 2.5 MG tablet, Take 3 tablets (7.5 mg total) by mouth daily. (Patient taking differently: Take 7.5 mg by mouth every evening. ), Disp: 90 tablet, Rfl: 3 .  aspirin 81 MG EC tablet, Take 81 mg by mouth daily. Swallow whole., Disp: , Rfl:  .  divalproex (DEPAKOTE) 500 MG DR tablet, Take 1 tablet (500 mg total) by mouth 2 (two) times daily., Disp: 180 tablet, Rfl: 3 .  metoprolol succinate (TOPROL-XL) 50 MG 24 hr tablet, Take 1 tablet (50 mg total) by mouth daily. Take with or immediately following a meal. (Patient taking differently: Take 50 mg by mouth every evening. Take with or immediately following a meal.), Disp: 90 tablet, Rfl: 3 .  Multiple Vitamin (MULTIVITAMIN) capsule, Take 1 capsule by mouth daily.  , Disp: , Rfl:  .  cholecalciferol (VITAMIN D) 1000 units tablet, Take 1 tablet (1,000 Units total) by mouth daily. (Patient not taking: Reported on 11/30/2019), Disp: 30 tablet, Rfl: 0   Social History: Social History   Tobacco Use  . Smoking status: Never Smoker  . Smokeless tobacco: Never Used  Substance Use Topics  . Alcohol use: Yes    Alcohol/week: 0.0 standard drinks    Comment: occasional  . Drug use: No    Family Medical History: Family History  Problem Relation Age of Onset  . Hypertension Mother   . Heart disease Father   . Breast cancer Daughter 57  . Diabetes Maternal Aunt   . Coronary artery disease Paternal Aunt   . Breast  cancer Paternal Aunt   . Coronary artery disease Paternal Uncle   . Heart disease Maternal Grandmother   . Heart disease Maternal Grandfather   . Heart disease Paternal Grandmother   . Heart disease Paternal Grandfather   . Colon cancer Neg Hx   . Stomach cancer Neg Hx   . Pancreatic cancer Neg Hx   . Rectal cancer Neg Hx     Physical Examination: Vitals:   11/30/19 0400 11/30/19 1000  BP: (!) 156/97 (!) 156/85  Pulse: 72 64  Resp: 20 18  Temp:    SpO2: 94% 97%     General: Patient is well developed, well nourished, calm, collected, and in no apparent distress.  Psychiatric: Patient is non-anxious.  Head:  Pupils equal, round, and reactive to light.  ENT:  Oral mucosa appears well hydrated.  Neck:   Supple.  Full range of motion. NO TTP over cervical spine  Respiratory: Patient is breathing without any difficulty.   NEUROLOGICAL:  General: In no acute distress.   Awake, alert, oriented to person, place,  and time. Names 3/3 objects correctly. Repeats phrases. Performs simple calculations. Speech is clear and fluent.   Pupils equal round and reactive to light. Peripheral field vision intact.  Facial tone is symmetric.  Tongue protrusion is midline.  There is no pronator drift.  Strength to bilateral Upper and lower extremities is symmetric and 5/5 strength throughout. SILT throughout extremities.    Imaging: 11/29/2019 Head CT: IMPRESSION: 1. Small right frontal subdural hematoma, measuring up to 5 mm in thickness. No significant mass effect or midline shift.   11/30/2019 Brain MRI: IMPRESSION: 1. Acute subdural hematoma overlying the right cerebral convexity, measuring up to 13 mm in maximal thickness at the level of the right frontal convexity. Mild mass effect on the subjacent right frontal lobe without significant midline shift. 2. No other acute intracranial abnormality. 3. Interval evolution of previously seen parenchymal hemorrhage involving the left  cerebellum and cerebellar vermis, now chronic in appearance. Scattered foci of cystic encephalomalacia without definite underlying lesion as previously questioned on prior MRI. 4. Underlying age-related cerebral atrophy with mild-to-moderate chronic small vessel ischemic disease.   11/30/2019 Head CT approx 0300: IMPRESSION: 1. Slight interval decrease in the acute subdural hemorrhage overlying the right frontal convexity and superior falx measuring a maximum diameter of 8 mm. Mild mass effect upon the right frontal lobe. No new extra-axial fluid collections. 2. Findings consistent with age related atrophy and chronic small vessel ischemia   Assessment and Plan: Tanya Knight is a pleasant 79 y.o. female with acute right frontal SDH suffered after a fall.  Repeat imaging from this morning shows decreased size of SDH in comparison to MRI brain.  Her neurologic exam is reassuring and she has no focal deficits.  - There is no acute neurosurgical intervention indicated at this time - Please continue to hold ASA - Please maintain BP < 675 systolic. - Repeat head CT in AM     Lonell Face, NP Dept. of Neurosurgery

## 2019-11-30 NOTE — ED Notes (Signed)
Pt transported to CT ?

## 2019-11-30 NOTE — Evaluation (Signed)
Clinical/Bedside Swallow Evaluation Patient Details  Name: Tanya Knight MRN: [id number] Date of Birth: 1940-06-25  Today's Date: 11/30/2019 Time: SLP Start Time (ACUTE ONLY): 63 SLP Stop Time (ACUTE ONLY): 1530 SLP Time Calculation (min) (ACUTE ONLY): 55 min  Past Medical History:  Past Medical History:  Diagnosis Date  . Fracture of metatarsal    Repair of fractured R metatarsal --Dr Duda---4/08  . GERD (gastroesophageal reflux disease)   . Hypertension   . Melanoma in situ [facility]) 7/17   Dr Kellie Moor  . Osteoporosis   . Seizure disorder ([place])   . Seizures ([place])   . Stroke ([place]) 06/2019  . Vitamin D deficiency    Past Surgical History:  Past Surgical History:  Procedure Laterality Date  . CATARACT EXTRACTION, BILATERAL Bilateral 2014  . EYE SURGERY     Obstructed tear duct  . FOOT SURGERY  2008  . MELANOMA EXCISION Left 11/2015   left lower leg  . TEAR DUCT PROBING  10/12   temporary stent  . TONSILLECTOMY AND ADENOIDECTOMY  1948   HPI:  Pt is a 79 y.o. female with medical history significant of Malnutrition of mild degree, Cerebellar vermis ICH with SDH and SAH, CVA in 2021 January, seizure dis. on depakote, GERD, hypertension, osteoporosis. syncopal episode that happened in the PM.  Family noticed that she lost consciousness and fell to her right side.  When she woke up, she reported pain in her head and ribs on the right.  She has had history of seizures in the past but was not witnessed to have any seizure activity today.  Patient has been taking her medications.  After a syncopal event and Fall, she did have a hard time speaking lasting between 20 to 30 minutes -- this has now resolved per pt/family/NSG.  Of note in January patient had a very similar presentation when she had a sudden onset of headache nausea vomiting slurred speech and weakness resulting in the fall.  MRI revealed: Acute subdural hematoma overlying the right cerebral convexity, measuring up to 13 mm in  maximal thickness at the level of the right frontal convexity. Mild mass effect on the subjacent right frontal lobe w/out midline shift; also, Interval evolution of previously seen parenchymal hemorrhage involving the left cerebellum and cerebellar vermis, now chronic in appearance. Stable 2 mm aneurysm of the distal supraclinoid right ICA.  Per chart notes, pt received ST therapy at CIR w/ d/c 07/12/2019 w/  mild cognitive deficits (primarily in the areas of problem solving and recall) still present but overall full improvement/achievement w/ POC goals; she was eating/drinking a Regular diet then. Also noted per MRI: Underlying age-related cerebral atrophy with mild-to-moderate chronic small vessel ischemic disease which might impact Cognition.    Assessment / Plan / Recommendation Clinical Impression  Pt appears to present w/ adequate oropharyngeal phase swallow w/ No oropharyngeal phase dysphagia noted, No neuromuscular deficits noted.Pt consumed po trials w/ No overt, clinical s/s of aspiration during po trials. Pt appears at reduced risk for aspiration following general aspiration precautions. During po trials, pt consumed all consistencies w/ no overt coughing, decline in vocal quality, or change in respiratory presentation during/post trials. O2 sats remained in upper 90s. Oral phase appeared [facility] w/ timely bolus management, mastication, and control of bolus propulsion for A-P transfer for swallowing. Oral clearing achieved w/ all trial consistencies. OM Exam appeared [facility] w/ no unilateral weakness noted. Speech Clear. Pt fed self w/ setup support. Recommend a Regular consistency diet w/ well-Cut  meats, moistened foods; Thin liquidsvia Cup if more comfortable - pt does not use straws often at home. Recommend general aspiration precautions, Pills WHOLE in Puree IF easier for swallowing as pt described Larger pills can be difficult to swallow. Education given on Pills in Puree; food consistencies and easy to  eat options; general aspiration precautions including taking TIme w/ all po's, and sitting Fully Upright w/ all oral intake. A meal/menu was obtained for pt as she was moving to next room out of ED. NSG to reconsult if any new needs arise. Pt/family/NSG agreed.  SLP Visit Diagnosis: Dysphagia, unspecified (R13.10)    Aspiration Risk   (reduced following general precautions)    Diet Recommendation  Regular diet w/ cut meats, moist foods; Thin liquids. General aspiration and REFLUX precautions.   Medication Administration: Whole meds with puree (if needed for ease of swallowing)    Other  Recommendations Recommended Consults:  (Dietician f/u for support as per chart notes) Oral Care Recommendations: Oral care BID;Oral care before and after PO;Patient independent with oral care Other Recommendations:  (n/a)   Follow up Recommendations None      Frequency and Duration  (n/a)   (n/a)       Prognosis Prognosis for Safe Diet Advancement: Good      Swallow Study   General Date of Onset: 11/29/19 HPI: Pt is a 79 y.o. female with medical history significant of Malnutrition of mild degree, Cerebellar vermis ICH with SDH and SAH, CVA in 2021 January, seizure dis. on depakote, GERD, hypertension, osteoporosis. syncopal episode that happened in the PM.  Family noticed that she lost consciousness and fell to her right side.  When she woke up, she reported pain in her head and ribs on the right.  She has had history of seizures in the past but was not witnessed to have any seizure activity today.  Patient has been taking her medications.  After a syncopal event and Fall, she did have a hard time speaking lasting between 20 to 30 minutes -- this has now resolved per pt/family/NSG.  Of note in January patient had a very similar presentation when she had a sudden onset of headache nausea vomiting slurred speech and weakness resulting in the fall.  MRI revealed: Acute subdural hematoma overlying the right  cerebral convexity, measuring up to 13 mm in maximal thickness at the level of the right frontal convexity. Mild mass effect on the subjacent right frontal lobe w/out midline shift; also, Interval evolution of previously seen parenchymal hemorrhage involving the left cerebellum and cerebellar vermis, now chronic in appearance. Stable 2 mm aneurysm of the distal supraclinoid right ICA.  Per chart notes, pt received ST therapy at CIR w/ d/c 07/12/2019 w/  mild cognitive deficits (primarily in the areas of problem solving and recall) still present but overall full improvement/achievement w/ POC goals; she was eating/drinking a Regular diet then. Also noted per MRI: Underlying age-related cerebral atrophy with mild-to-moderate chronic small vessel ischemic disease which might impact Cognition.  Type of Study: Bedside Swallow Evaluation Previous Swallow Assessment: none Diet Prior to this Study: NPO (at admit) Temperature Spikes Noted: No (wbc 6.6) Respiratory Status: Room air History of Recent Intubation: No Behavior/Cognition: Alert;Cooperative;Pleasant mood Oral Cavity Assessment: Within Functional Limits (min dry) Oral Care Completed by SLP: Recent completion by staff Oral Cavity - Dentition: Adequate natural dentition Vision: Functional for self-feeding Self-Feeding Abilities: Able to feed self;Needs set up Patient Positioning: Upright in bed (needed min positioning) Baseline Vocal Quality: Normal  Volitional Cough: Strong (tender ribs) Volitional Swallow: Able to elicit    Oral/Motor/Sensory Function Overall Oral Motor/Sensory Function: Within functional limits   Ice Chips Ice chips: Within functional limits Presentation: Spoon (fed; 2 trials)   Thin Liquid Thin Liquid: Within functional limits Presentation: Cup;Self Fed;Straw (~4 ozs total)    Nectar Thick Nectar Thick Liquid: Not tested   Honey Thick Honey Thick Liquid: Not tested   Puree Puree: Within functional limits Presentation: Self  Fed;Spoon (4 ozs)   Solid     Solid: Within functional limits Presentation: Self Fed (6 trials)       Orinda Kenner, MS, CCC-SLP [place] 11/30/2019,4:39 PM

## 2019-11-30 NOTE — ED Notes (Signed)
Report received. Pt is A&Ox4 and NAD at this time. Pt resting comfortably in ED stretcher. ED stretcher locked and in lowest position. Denies pain at this time. Call bell within reach. Pt placed on cardiac monitor, V/S WNL. No needs expressed at this time.

## 2019-12-01 ENCOUNTER — Telehealth: Payer: Self-pay | Admitting: Neurology

## 2019-12-01 ENCOUNTER — Observation Stay: Payer: Medicare PPO

## 2019-12-01 DIAGNOSIS — E782 Mixed hyperlipidemia: Secondary | ICD-10-CM | POA: Diagnosis not present

## 2019-12-01 DIAGNOSIS — S065X9A Traumatic subdural hemorrhage with loss of consciousness of unspecified duration, initial encounter: Secondary | ICD-10-CM | POA: Diagnosis not present

## 2019-12-01 DIAGNOSIS — S2241XA Multiple fractures of ribs, right side, initial encounter for closed fracture: Secondary | ICD-10-CM | POA: Diagnosis not present

## 2019-12-01 DIAGNOSIS — R4789 Other speech disturbances: Secondary | ICD-10-CM | POA: Diagnosis not present

## 2019-12-01 DIAGNOSIS — K219 Gastro-esophageal reflux disease without esophagitis: Secondary | ICD-10-CM

## 2019-12-01 DIAGNOSIS — I10 Essential (primary) hypertension: Secondary | ICD-10-CM | POA: Diagnosis not present

## 2019-12-01 DIAGNOSIS — S22000A Wedge compression fracture of unspecified thoracic vertebra, initial encounter for closed fracture: Secondary | ICD-10-CM | POA: Diagnosis not present

## 2019-12-01 DIAGNOSIS — R55 Syncope and collapse: Secondary | ICD-10-CM | POA: Diagnosis not present

## 2019-12-01 DIAGNOSIS — S065X0D Traumatic subdural hemorrhage without loss of consciousness, subsequent encounter: Secondary | ICD-10-CM | POA: Diagnosis not present

## 2019-12-01 LAB — HEMOGLOBIN A1C
Hgb A1c MFr Bld: 5.2 % (ref 4.8–5.6)
Mean Plasma Glucose: 103 mg/dL

## 2019-12-01 IMAGING — CT CT HEAD W/O CM
4 series · 17 of 47 positions shown, 19 images · non-contrast
Comparison: [DATE]

CLINICAL DATA: Subdural hematoma.

EXAM:
CT HEAD WITHOUT CONTRAST
TECHNIQUE: Contiguous axial images were obtained from the base of the skull
through the vertex without intravenous contrast.

[Series 2: head bone · axial · 0.42mm/px · z∈[-67,+5]mm · 5 of 77 slices shown]
[im 8/77  bone]
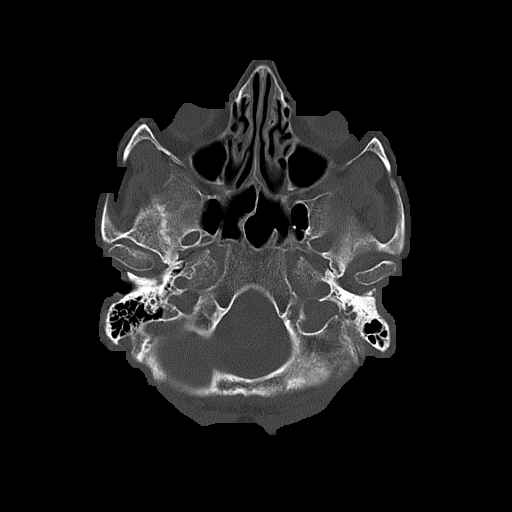
[im 15/77  bone]
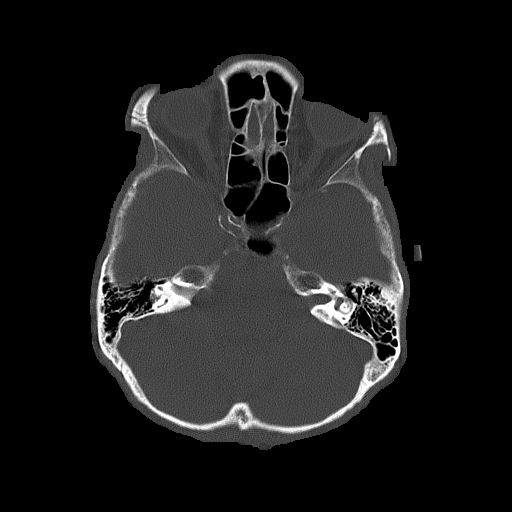
[im 26/77  bone]
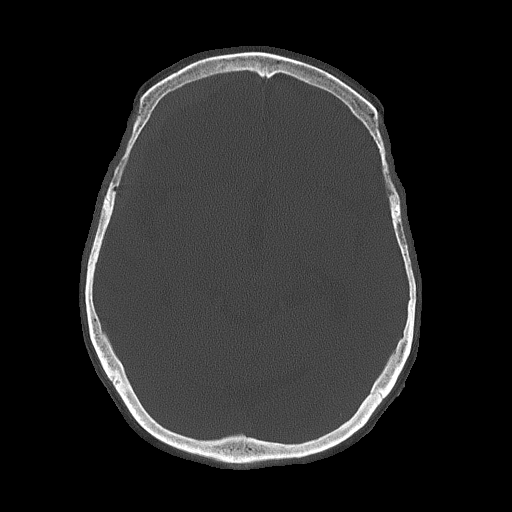
[im 33/77  bone]
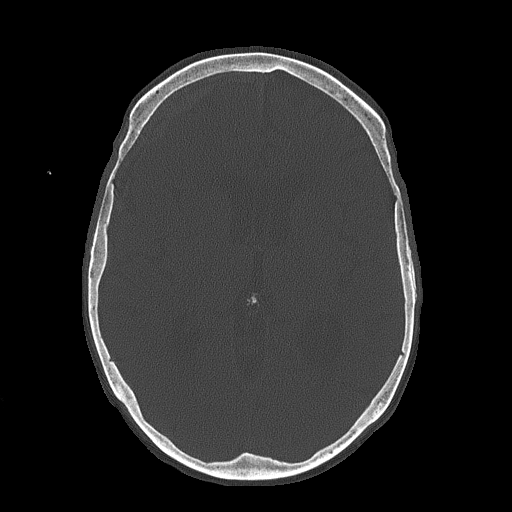
[im 44/77  bone]
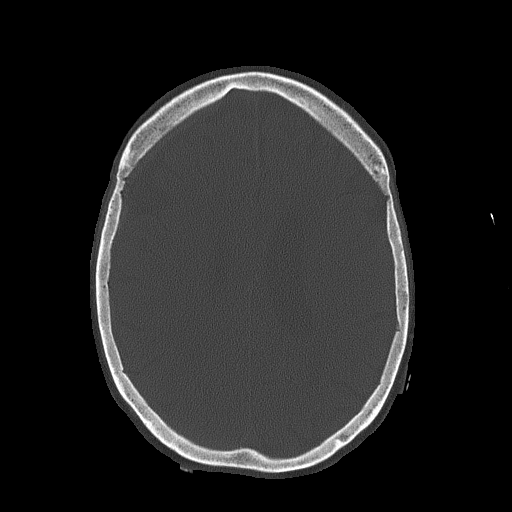

[Series 3: head wo · axial · 0.42mm/px · z∈[-61,+39]mm · 6 of 29 slices shown, 8 images]
[im 5/29  brain]
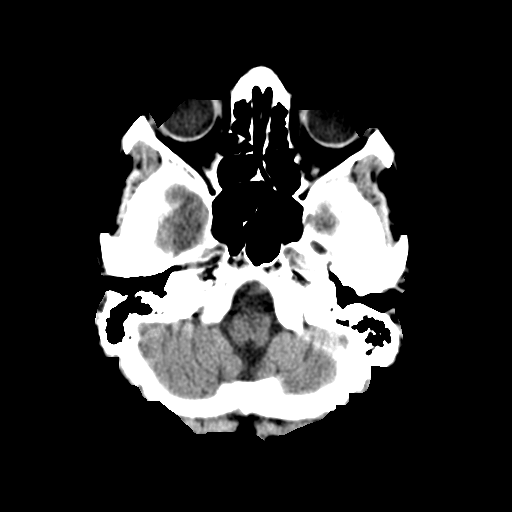
[im 5/29  bone]
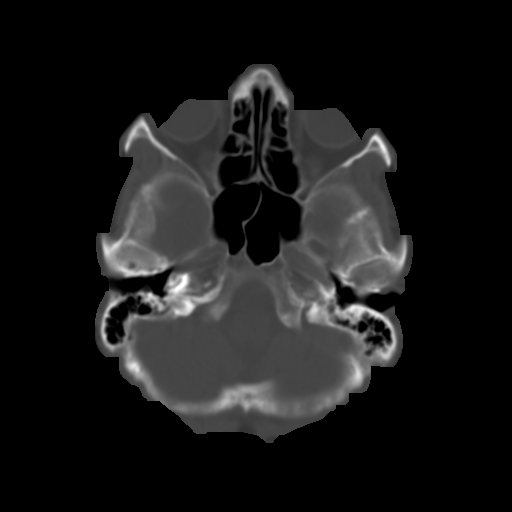
[im 9/29  brain]
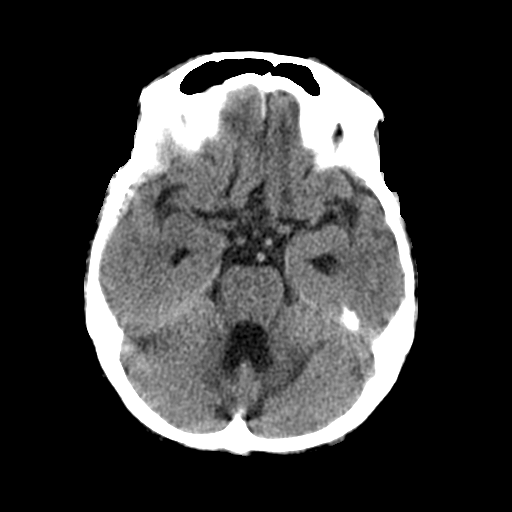
[im 13/29  brain]
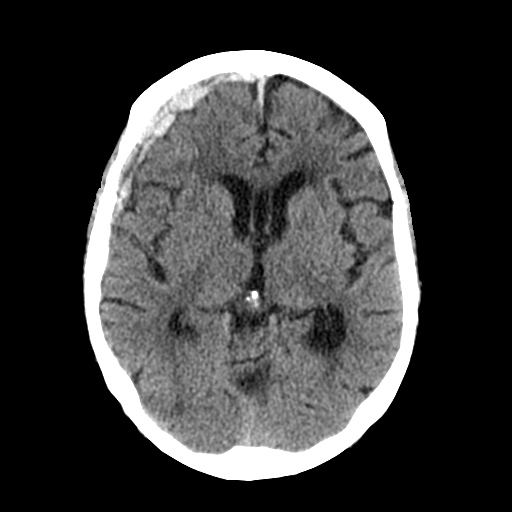
[im 17/29  brain]
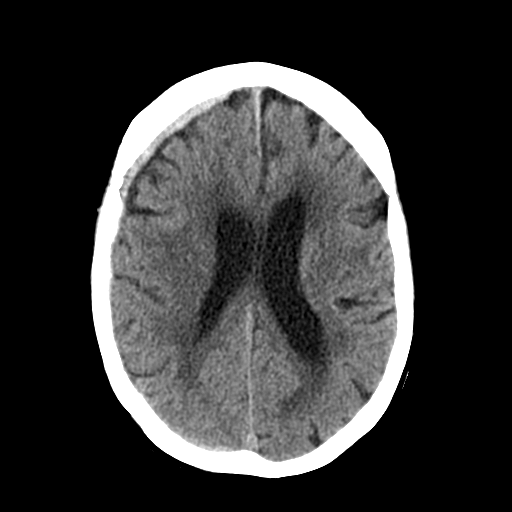
[im 21/29  brain]
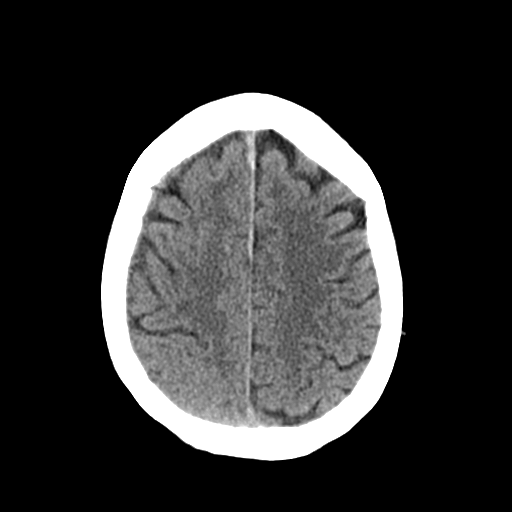
[im 21/29  bone]
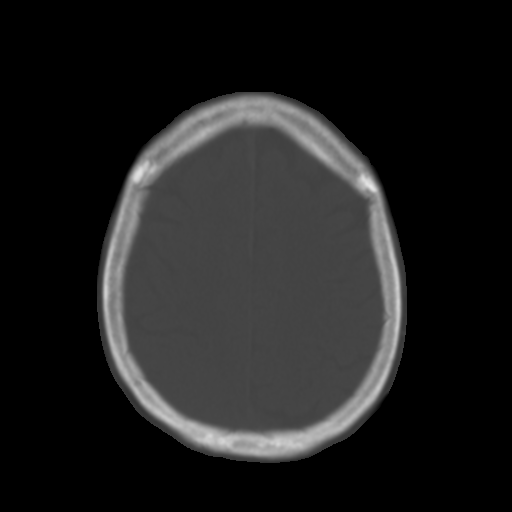
[im 25/29  brain]
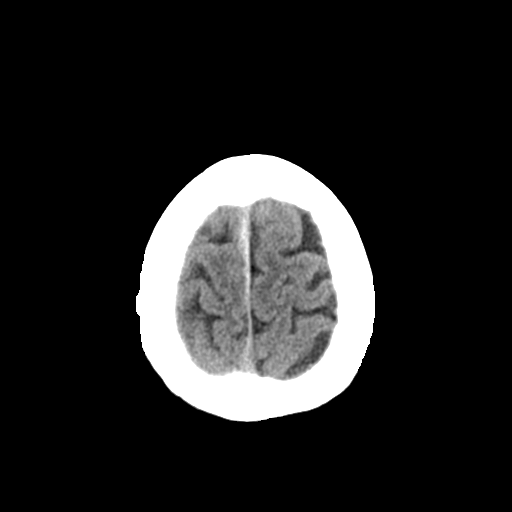

[Series 4: coronal soft tissue · coronal · 0.31mm/px · 3 of 62 slices shown]
[im 21/62  brain]
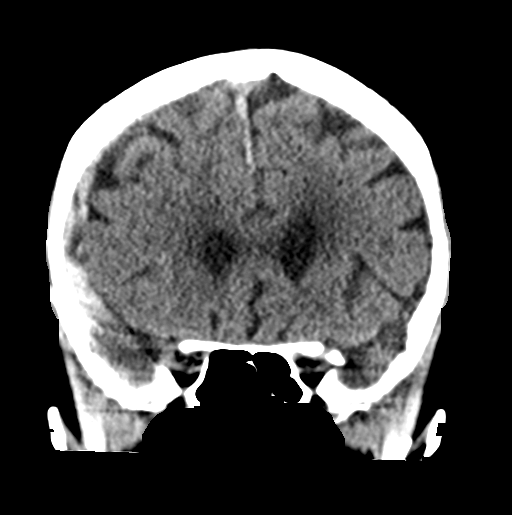
[im 28/62  brain]
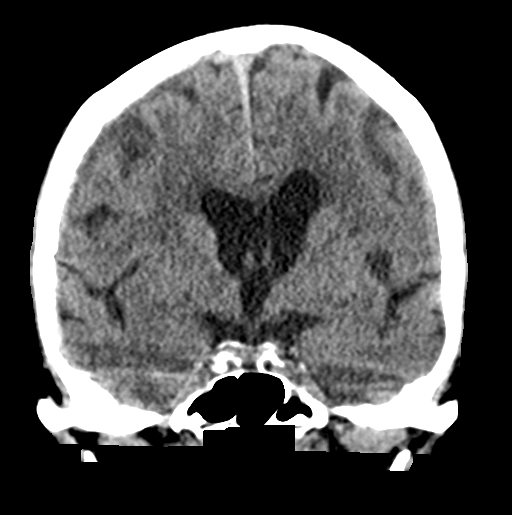
[im 34/62  brain]
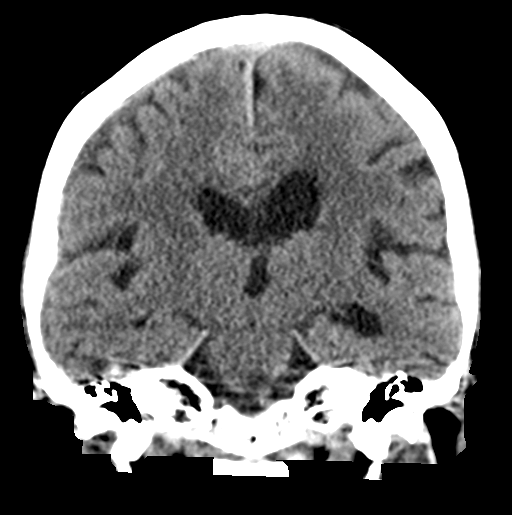

[Series 5: sagittal soft tissue · sagittal · 0.31mm/px · 3 of 49 slices shown]
[im 17/49  brain]
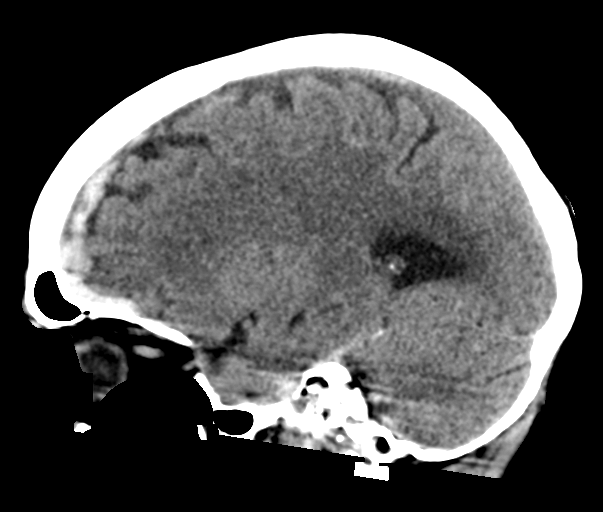
[im 25/49  brain]
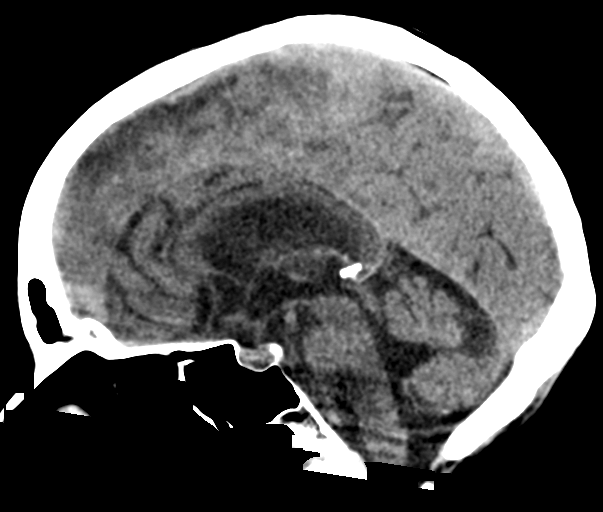
[im 33/49  brain]
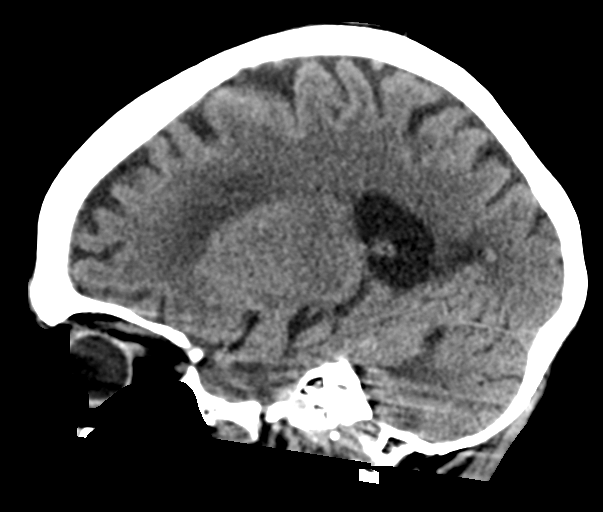

[17 of 47 positions shown; findings below may reference images not displayed]

FINDINGS: Brain: Acute subdural hematoma overlying the right cerebral
convexity is unchanged in size measuring up to 9 mm in thickness
anterior to the frontal lobe. There is very mild mass effect on the
underlying right frontal lobe with unchanged trace leftward midline
shift. Small volume subdural hematoma anteriorly along the falx is
also unchanged. No new intracranial hemorrhage, acute infarct, or
mass is identified. Mild cerebral atrophy is noted. Hypodensities in
the cerebral white matter bilaterally are unchanged and nonspecific
but compatible with mild chronic small vessel ischemic disease.
Encephalomalacia is again noted in the left cerebellar hemisphere
and vermis related to a prior hemorrhage.

Vascular: Calcified atherosclerosis at the skull base. No hyperdense
vessel.

Skull: No fracture or suspicious osseous lesion.

Sinuses/Orbits: Visualized paranasal sinuses and mastoid air cells
are clear. Bilateral cataract extraction.

Other: None.
IMPRESSION: 1. Unchanged subdural hematoma over the right cerebral convexity and
falx.
2. No evidence of new intracranial abnormality.

## 2019-12-01 MED ORDER — DIVALPROEX SODIUM 500 MG PO DR TAB
750.0000 mg | DELAYED_RELEASE_TABLET | Freq: Two times a day (BID) | ORAL | Status: DC
  Filled 2019-12-01: qty 1

## 2019-12-01 MED ORDER — ACETAMINOPHEN 325 MG PO TABS: 650.0000 mg | ORAL_TABLET | ORAL | Status: DC | PRN

## 2019-12-01 MED ORDER — DIVALPROEX SODIUM 250 MG PO DR TAB: 750.0000 mg | DELAYED_RELEASE_TABLET | Freq: Two times a day (BID) | ORAL | 0 refills | Status: DC

## 2019-12-01 MED ORDER — ACETAMINOPHEN 650 MG RE SUPP: 650.0000 mg | RECTAL | Status: DC | PRN

## 2019-12-01 MED ORDER — ASPIRIN 81 MG PO TBEC: 81.0000 mg | DELAYED_RELEASE_TABLET | Freq: Every day | ORAL | 12 refills | Status: DC

## 2019-12-01 MED ORDER — ACETAMINOPHEN 160 MG/5ML PO SOLN
650.0000 mg | ORAL | Status: DC | PRN
  Filled 2019-12-01: qty 20.3

## 2019-12-01 NOTE — Discharge Instructions (Signed)
Rib Fracture  A rib fracture is a break or crack in one of the bones of the ribs. The ribs are like a cage that goes around your upper chest. A broken or cracked rib is often painful, but most do not cause other problems. Most rib fractures usually heal on their own in 1-3 months. Follow these instructions at home: Managing pain, stiffness, and swelling  If directed, apply ice to the injured area. ? Put ice in a plastic bag. ? Place a towel between your skin and the bag. ? Leave the ice on for 20 minutes, 2-3 times a day.  Take over-the-counter and prescription medicines only as told by your doctor. Activity  Avoid activities that cause pain to the injured area. Protect your injured area.  Slowly increase activity as told by your doctor. General instructions  Do deep breathing as told by your doctor. You may be told to: ? Take deep breaths many times a day. ? Cough many times a day while hugging a pillow. ? Use a device (incentive spirometer) to do deep breathing many times a day.  Drink enough fluid to keep your pee (urine) clear or pale yellow.  Do not wear a rib belt or binder. These do not allow you to breathe deeply.  Keep all follow-up visits as told by your doctor. This is important. Contact a doctor if:  You have a fever. Get help right away if:  You have trouble breathing.  You are short of breath.  You cannot stop coughing.  You cough up thick or bloody spit (sputum).  You feel sick to your stomach (nauseous), throw up (vomit), or have belly (abdominal) pain.  Your pain gets worse and medicine does not help. Summary  A rib fracture is a break or crack in one of the bones of the ribs.  Apply ice to the injured area and take medicines for pain as told by your doctor.  Take deep breaths and cough many times a day. Hug a pillow every time you cough. This information is not intended to replace advice given to you by your health care provider. Make sure you  discuss any questions you have with your health care provider. Document Revised: 04/10/2017 Document Reviewed: 07/29/2016 Elsevier Patient Education  2020 [employer].  Subdural Hematoma  A subdural hematoma is a collection of blood between the brain and its outer covering (dura). As the amount of blood increases, pressure builds on the brain. There are two types of subdural hematomas:  Acute. This type develops shortly after a hard, direct hit to the head and causes blood to collect very quickly. This is a medical emergency. If it is not diagnosed and treated quickly, it can lead to severe brain injury or death.  Chronic. This is when bleeding develops more slowly, over weeks or months. In some cases, this type does not cause symptoms. What are the causes? This condition is caused by bleeding (hemorrhage) from a broken (ruptured) blood vessel. In most cases, a blood vessel ruptures and bleeds because of a head injury, such as from a hard, direct hit. Head injuries can happen in car accidents, falls, assaults, or while playing sports. In rare cases, a hemorrhage can happen without a known cause (spontaneously), especially if you take blood thinners (anticoagulants). What increases the risk? This condition is more likely to develop in:  Older people.  Infants.  People who take blood thinners.  People who have head injuries.  People who abuse alcohol. What  are the signs or symptoms? Symptoms of this condition can vary depending on the size of the hematoma. Symptoms can be mild, severe, or life-threatening. They include:  Headaches.  Nausea or vomiting.  Changes in vision, such as double vision or loss of vision.  Changes in speech or trouble understanding what people say.  Loss of balance or trouble walking.  Weakness, numbness, or tingling in the arms or legs, especially on one side of the body.  Seizures.  Change in personality.  Increased sleepiness.  Memory  loss.  Loss of consciousness.  Coma. Symptoms of acute subdural hematoma can develop over minutes or hours. Symptoms of chronic subdural hematoma may develop over weeks or months. How is this diagnosed? This condition is diagnosed based on the results of:  A physical exam.  Tests of strength, reflexes, coordination, senses, manner of walking (gait), and facial and eye movements (neurological exam).  Imaging tests, such as an MRI or a CT scan. How is this treated? Treatment for this condition depends on the type of hematoma and how severe it is. Treatment for acute hematoma may include:  Emergency surgery to drain blood or remove a blood clot.  Medicines that help the body get rid of excess fluids (diuretics). These may help to reduce pressure in the brain.  Assisted breathing (ventilation). Treatment for chronic hematoma may include:  Observation and bed rest at the hospital.  Surgery. If you take blood thinners, you may need to stop taking them for a short time. You may also be given anti-seizure (anticonvulsant) medicine. Sometimes, no treatment is needed for chronic subdural hematoma. Follow these instructions at home: Activity  Avoid situations where you could injure your head again, such as in competitive sports, downhill snow sports, and horseback riding. Do not do these activities until your health care provider approves. ? Wear protective gear, such as a helmet, when participating in activities such as biking or contact sports.  Avoid too much visual stimulation while recovering. This means limiting how much you read and limiting your screen time on a smart phone, tablet, computer, or TV.  Rest as told by your health care provider. Rest helps the brain heal.  Try to avoid activities that cause physical or mental stress. Return to work or school as told by your health care provider.  Do not lift anything that is heavier than 5 lb (2.3 kg), or the limit you are told,  until your health care provider says that it is safe.  Do not drive, ride a bike, or use heavy machinery until your health care provider approves.  Always wear your seat belt when you are in a motor vehicle. Alcohol use  Do not drink alcohol if your health care provider tells you not to drink.  If you drink alcohol, limit how much you use to: ? 0-1 drink a day for women. ? 0-2 drinks a day for men. General instructions  Monitor your symptoms, and ask people around you to do the same. Recovery from brain injuries varies. Talk with your health care provider about what to expect.  Take over-the-counter and prescription medicines only as told by your health care provider. Do not take blood thinners or NSAIDs unless your health care provider approves. These include aspirin, ibuprofen, naproxen, and warfarin.  Keep your home environment safe to reduce the risk of falling.  Keep all follow-up visits as told by your health care provider. This is important. Where to find more information  [employer] Neurological  Disorders and Stroke: MasterBoxes.it  American Academy of Neurology (AAN): http://keith.biz/  [employer] of [place]: www.biausa.org Get help right away if you:  Are taking blood thinners and you fall or you experience minor trauma to the head. If you take any blood thinners, even a very small injury can cause a subdural hematoma.  Have a bleeding disorder and you fall or you experience minor trauma to the head.  Develop any of the following symptoms after a head injury: ? Clear fluid draining from your nose or ears. ? Nausea or vomiting. ? Changes in speech or trouble understanding what people say. ? Seizures. ? Drowsiness or a decrease in alertness. ? Double vision. ? Numbness or inability to move (paralysis) in any part of your body. ? Difficulty walking or poor coordination. ? Difficulty thinking. ? Confusion or forgetfulness. ? Personality  changes. ? Irrational or aggressive behavior. These symptoms may represent a serious problem that is an emergency. Do not wait to see if the symptoms will go away. Get medical help right away. Call your local emergency services (911 in the [place] Do not drive yourself to the hospital. Summary  A subdural hematoma is a collection of blood between the brain and its outer covering (dura).  Treatment for this condition depends on what type of subdural hematoma you have and how severe it is.  Symptoms can vary from mild to severe to life-threatening.  Monitor your symptoms, and ask others around you to do the same. This information is not intended to replace advice given to you by your health care provider. Make sure you discuss any questions you have with your health care provider. Document Revised: 03/29/2018 Document Reviewed: 03/29/2018 Elsevier Patient Education  2020 [employer].

## 2019-12-01 NOTE — Plan of Care (Signed)
Pt neuro check remain unchanged, VSS, pt  progressing toward goal. A/ox4, maex4, still complaining of HA, 8/10 PRN tylenol and pt stated relief.

## 2019-12-01 NOTE — Discharge Summary (Signed)
Physician Discharge Summary  [name] IZT:[id number] DOB: 07-14-1940 DOA: 11/29/2019  PCP: Venia Carbon, MD  Admit date: 11/29/2019 Discharge date: 12/01/2019  Admitted From: Home Disposition: Home  Recommendations for Outpatient Follow-up:  1. Follow up with PCP in 1-2 weeks 2. Follow-up with neurosurgery, Dr. Cari Caraway as scheduled 3. Follow-up with [facility] in 2 weeks 4. Increased Depakote to 750 mg p.o. twice daily 5. Continue to hold aspirin per neurosurgery recommendations for 7 days following hospitalization 6. Patient unable to drive, operate heavy machinery, perform activities at heights and participate in water activities until release by outpatient physician.  Home Health: Yes, resume PT/OT Equipment/Devices: None  Discharge Condition: Stable CODE STATUS: Full code Diet recommendation: Heart healthy diet  History of present illness:  Tanya Knight is a 79 year old Caucasian female with past medical history notable for CVA in January 2021, seizure disorder on Depakote, GERD, essential hypertension, osteoporosis who presented to the ED following fall and syncopal episode around 7 PM.  Patient's family reported that she fell to her right side and when she woke up reported pain in her head and ribs.  History of seizure disorder in the past but was not witnessed to have any seizure type activity today.  And family/patient reports compliance with her Depakote.  Following the syncopal event she did have a hard time speaking lasting between 20-30 minutes with difficulty finding words.  Patient is not on any blood thinners but does take aspirin.  Of note in January patient had a very similar presentation when she had a sudden onset of headache nausea vomiting slurred speech and weakness resulting in the fall. 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 During that admission CT head  showed no AVMs. 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 She had 2D echo done that showed preserved EF and no cardiogenic source of emboli.  In the ED, CT head with right frontal subdural 5 mm with no shift.  Chest x-ray with right-sided rib fractures.  CT chest with displaced/comminuted right fourth/fifth rib fracture, 6/seventh rib fracture, L1 vertebral body fracture.  ED physician discussed with neurosurgery on-call, Dr. Lacinda Axon who recommended admit to medicine and no transfer at this time with repeat CT head in 4 hours.  EDP consulted [facility] for admission.  Hospital course:  Acute right frontal subdural hematoma Patient presenting following fall at home with trauma to head.  Initial CT head with 5 mm right frontal subdural hematoma.  MR brain with 13 mm right frontal subdural hematoma with slight midline shift. CT C-spine without acute fracture. Case was discussed with neurosurgery, Dr. Lacinda Axon who recommended medicine admission and interval CT head scans. Neurosurgery and neurology followed during hospital course. CT angiogram head shows stable 2 mm aneurysm distal supraclinoid right ICA. Serial CT head without contrast shows stable/improving SDH. Depakote increased to 750 mg p.o. twice daily by neurology. We'll continue to hold aspirin for 7 days following hospitalization per neurosurgery recommendations. Also patient instructed not to use NSAIDs during this timeframe. Outpatient follow-up with neurosurgery and neurology. Patient unable to drive, operate heavy machinery, perform activities at heights and participate in water activities until release by outpatient physician.  Syncope and collapse Unclear cause.  History of seizure disorder in the past.  Consideration of neurological versus cardiogenic event.  EEG without any acute findings.  Carotid ultrasound unrevealing. Patient was monitored on telemetry without any signs  of arrhythmia. Continue outpatient  follow-up with PCP.  Multiple right rib fractures CT chest notable for multiple right rib fractures fourth/fifth and sixth/seventh. Continue Tylenol as needed. Incentive spirometry.  History of seizure disorder Patient on Depakote 500 mg p.o. twice daily.  Depakote level 52.  EEG shows no focal lateralization or epileptiform features. Depakote increased to 70 mg p.o. twice daily per neurology while inpatient. Follow-up with [facility] in 2 weeks.  Essential hypertension Continue home amlodipine 7.5 mg p.o. daily and metoprolol succinate 50 mg p.o. daily.  Discharge Diagnoses:  Active Problems:   Essential hypertension, benign   GERD   Seizure disorder (HCC)   Hyperlipidemia   Essential hypertension   Malnutrition of mild degree (HCC)   Subdural hematoma (HCC)   Syncope and collapse   Rib fracture   Fracture of thoracic vertebra, compression [facility])    Discharge Instructions  Discharge Instructions    Call MD for:  difficulty breathing, headache or visual disturbances   Complete by: As directed    Call MD for:  extreme fatigue   Complete by: As directed    Call MD for:  persistant dizziness or light-headedness   Complete by: As directed    Call MD for:  persistant nausea and vomiting   Complete by: As directed    Call MD for:  redness, tenderness, or signs of infection (pain, swelling, redness, odor or green/yellow discharge around incision site)   Complete by: As directed    Call MD for:  severe uncontrolled pain   Complete by: As directed    Call MD for:  temperature >100.4   Complete by: As directed    Diet - low sodium heart healthy   Complete by: As directed    Increase activity slowly   Complete by: As directed      Allergies as of 12/01/2019      Reactions   Phenytoin    Other reaction(s): Other (See Comments) Other Reaction: Other reaction REACTION: severe reaction   Septra [sulfamethoxazole-trimethoprim]    Nausea, trouble eating and  drinking      Medication List    STOP taking these medications   cholecalciferol 1000 units tablet Commonly known as: VITAMIN D     TAKE these medications   amLODipine 2.5 MG tablet Commonly known as: NORVASC Take 3 tablets (7.5 mg total) by mouth daily. What changed: when to take this   aspirin 81 MG EC tablet Take 1 tablet (81 mg total) by mouth daily. Swallow whole. Start taking on: December 09, 2019 What changed: These instructions start on December 09, 2019. If you are unsure what to do until then, ask your doctor or other care provider. Notes to patient: Continue to hold aspirin for another 7 days.   divalproex 250 MG DR tablet Commonly known as: DEPAKOTE Take 3 tablets (750 mg total) by mouth 2 (two) times daily. What changed:   medication strength  how much to take   metoprolol succinate 50 MG 24 hr tablet Commonly known as: TOPROL-XL Take 1 tablet (50 mg total) by mouth daily. Take with or immediately following a meal. What changed: when to take this   multivitamin capsule Take 1 capsule by mouth daily.       Follow-up Information    Venia Carbon, MD. Schedule an appointment as soon as possible for a visit in 1 week(s).   Specialties: Internal Medicine, Pediatrics Contact information: [address] [place] [address] [phone]  Meade Maw, MD. Call in 1 week(s).   Specialty: Neurosurgery Contact information: [place] [address] [phone]        Frann Rider, [place]. Schedule an appointment as soon as possible for a visit in 2 week(s).   Specialty: Neurology Contact information: [address] [place] [address] [phone]              Allergies  Allergen Reactions  . Phenytoin     Other reaction(s): Other (See Comments) Other Reaction: Other reaction REACTION: severe reaction  . Septra [Sulfamethoxazole-Trimethoprim]     Nausea, trouble eating and drinking     Consultations:  Neurology, Dr. Doy Mince  Neurosurgery, Dr. Lacinda Axon, Dr. Izora Ribas   Procedures/Studies: EEG  Result Date: 11/30/2019 Alexis Goodell, MD     11/30/2019  1:33 PM ELECTROENCEPHALOGRAM REPORT Patient: Tanya Knight       Room #: ED36A EEG No. ID: [id number] Age: 79 y.o.        Sex: female Requesting Physician: [place] Report Date:  11/30/2019       Interpreting Physician: Alexis Goodell History: Tanya Knight is an 79 y.o. female with syncope Medications: Depakote Conditions of Recording:  This is a 21 channel routine scalp EEG performed with bipolar and monopolar montages arranged in accordance to the international 10/20 system of electrode placement. One channel was dedicated to EKG recording. The patient is in the awake, drowsy and asleep states. Description:  The waking background activity consists of a low voltage, symmetrical, fairly well organized, 8 Hz alpha activity, seen from the parieto-occipital and posterior temporal regions.  Low voltage fast activity, poorly organized, is seen anteriorly and is at times superimposed on more posterior regions.  A mixture of theta and alpha rhythms are seen from the central and temporal regions. The patient drowses with slowing to irregular, low voltage theta and beta activity.  The patient goes in to a light sleep with symmetrical sleep spindles, vertex central sharp transients and irregular slow activity.  No epileptiform activity is noted.  Hyperventilation was not performed.  Intermittent photic stimulation was performed but failed to illicit any change in the tracing.  IMPRESSION: Normal electroencephalogram, awake, asleep and with activation procedures. There are no focal lateralizing or epileptiform features. Alexis Goodell, MD Neurology [phone] 11/30/2019, 1:32 PM   CT ANGIO HEAD W OR WO CONTRAST  Result Date: 11/30/2019 CLINICAL DATA:  Subdural hematoma EXAM: CT ANGIOGRAPHY HEAD TECHNIQUE: Multidetector CT imaging of the head  was performed using the standard protocol during bolus administration of intravenous contrast. Multiplanar CT image reconstructions and MIPs were obtained to evaluate the vascular anatomy. CONTRAST:  57mL OMNIPAQUE IOHEXOL 350 MG/ML SOLN COMPARISON:  06/10/2019 FINDINGS: CTA HEAD Anterior circulation: Intracranial internal carotid arteries are patent with mild calcified plaque. A 2 mm inferiorly directed aneurysm from may distal supraclinoid right ICA is unchanged. Anterior and middle cerebral arteries patent. Posterior circulation: Intracranial vertebral arteries, basilar artery, and posterior cerebral arteries are patent. A left posterior communicating arteries present with fetal or near fetal origin of the PCA. Venous sinuses: Not well evaluated. 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 No acute findings. Electronically Signed   By: Macy Mis M.D.   On: 11/30/2019 14:26   DG Chest 2 View  Result Date: 11/29/2019 CLINICAL DATA:  Rib pain, fall, EXAM: CHEST - 2 VIEW COMPARISON:  06/15/2019 FINDINGS: There is an acute fracture of the right fourth rib anterolaterally. Several remote right rib fractures are  also noted. The lungs are clear. No pneumothorax. Tiny right pleural effusion is present. Small hiatal hernia is noted. Cardiac size within normal limits. The pulmonary vascularity is normal. There is progressive loss of height involving the T12 compression fracture with new anterior wedge compression fracture of probable T11 since prior examination, though exact localization is difficult due to overlying metallic artifact. IMPRESSION: Acute right fourth rib anterolateral fracture.  No pneumothorax. Multiple thoracolumbar compression fractures, new and progressive since prior examination, age indeterminate. These could be better assessed with MRI examination, particularly if percutaneous vertebral stabilization is considered as a therapeutic option. Electronically Signed   By:  Fidela Salisbury MD   On: 11/29/2019 21:34   CT HEAD WO CONTRAST  Result Date: 12/01/2019 CLINICAL DATA:  Subdural hematoma. EXAM: CT HEAD WITHOUT CONTRAST TECHNIQUE: Contiguous axial images were obtained from the base of the skull through the vertex without intravenous contrast. COMPARISON:  11/30/2019 FINDINGS: Brain: Acute subdural hematoma overlying the right cerebral convexity is unchanged in size measuring up to 9 mm in thickness anterior to the frontal lobe. There is very mild mass effect on the underlying right frontal lobe with unchanged trace leftward midline shift. Small volume subdural hematoma anteriorly along the falx is also unchanged. No new intracranial hemorrhage, acute infarct, or mass is identified. Mild cerebral atrophy is noted. Hypodensities in the cerebral white matter bilaterally are unchanged and nonspecific but compatible with mild chronic small vessel ischemic disease. Encephalomalacia is again noted in the left cerebellar hemisphere and vermis related to a prior hemorrhage. Vascular: Calcified atherosclerosis at the skull base. No hyperdense vessel. Skull: No fracture or suspicious osseous lesion. Sinuses/Orbits: Visualized paranasal sinuses and mastoid air cells are clear. Bilateral cataract extraction. Other: None. IMPRESSION: 1. Unchanged subdural hematoma over the right cerebral convexity and falx. 2. No evidence of new intracranial abnormality. Electronically Signed   By: Logan Bores M.D.   On: 12/01/2019 06:57   CT Head Wo Contrast  Result Date: 11/30/2019 CLINICAL DATA:  Fall, recent subdural hemorrhage EXAM: CT HEAD WITHOUT CONTRAST TECHNIQUE: Contiguous axial images were obtained from the base of the skull through the vertex without intravenous contrast. COMPARISON:  MRI brain November 30, 2019 12:56 a.m. FINDINGS: Brain: Again noted is a acute subdural hemorrhage seen overlying the right frontal lobe measuring up to maximum diameter of 8 mm, slightly decreased in size from  the recent MRI. There is also a small amount of subdural hemorrhage overlying the superior falx measuring maximum diameter of 3 mm. There is mild mass effect along the right frontal lobe. There is minimal right to leftward shift of 2 mm. There is dilatation the ventricles and sulci consistent with age-related atrophy. Low-attenuation changes in the deep white matter consistent with small vessel ischemia. Area of hypodensity seen within the left cerebellum from prior hemorrhagic stroke. Vascular: No hyperdense vessel or unexpected calcification. Skull: The skull is intact. No fracture or focal lesion identified. Sinuses/Orbits: The visualized paranasal sinuses and mastoid air cells are clear. The orbits and globes intact. Other: None IMPRESSION: 1. Slight interval decrease in the acute subdural hemorrhage overlying the right frontal convexity and superior falx measuring a maximum diameter of 8 mm. Mild mass effect upon the right frontal lobe. No new extra-axial fluid collections. 2. Findings consistent with age related atrophy and chronic small vessel ischemia Electronically Signed   By: Prudencio Pair M.D.   On: 11/30/2019 03:18   CT HEAD WO CONTRAST  Result Date: 11/29/2019 CLINICAL DATA:  Syncope, loss  of consciousness, fell on right side EXAM: CT HEAD WITHOUT CONTRAST TECHNIQUE: Contiguous axial images were obtained from the base of the skull through the vertex without intravenous contrast. COMPARISON:  06/15/2019 FINDINGS: Brain: There is a small right frontal subdural hematoma measuring up to 5 mm in thickness. There is no significant mass effect or midline shift. No acute infarct. Lateral ventricles and midline structures are stable. Vascular: No hyperdense vessel or unexpected calcification. Skull: Normal. Negative for fracture or focal lesion. Sinuses/Orbits: No acute finding. Other: None. IMPRESSION: 1. Small right frontal subdural hematoma, measuring up to 5 mm in thickness. No significant mass effect or  midline shift. These results were called by telephone at the time of interpretation on 11/29/2019 at 9:37 pm to provider [facility] , who verbally acknowledged these results. Electronically Signed   By: Randa Ngo M.D.   On: 11/29/2019 21:37   CT Chest Wo Contrast  Result Date: 11/29/2019 CLINICAL DATA:  Syncopal episode and fall to right side, pain and right ribs EXAM: CT CHEST WITHOUT CONTRAST TECHNIQUE: Multidetector CT imaging of the chest was performed following the standard protocol without IV contrast. COMPARISON:  Radiograph 11/29/2019, CT 01/05/2015 FINDINGS: Cardiovascular: Normal heart size. No pericardial effusion. Extensive coronary artery calcifications are present. Calcifications are present upon the mitral annulus, chordae tendinae, and aortic leaflets. Atherosclerotic plaque within the normal caliber aorta. No abnormal hyperdense mural thickening or plaque displacement to suggest intramural hematoma. No periaortic stranding or hemorrhage. Shared origin of brachiocephalic and left common carotid artery. Minimal plaque in the proximal great vessels and cervical carotids. Central pulmonary arteries are normal caliber. Luminal evaluation of the vasculature is precluded in the absence of contrast media. Mediastinum/Nodes: Patulous, fluid-filled thoracic esophagus with moderate hiatal hernia. No acute abnormality of the trachea. No mediastinal or axillary adenopathy. Hilar nodal evaluation limited in the absence of contrast media. No mediastinal fluid, hemorrhage or gas. Thyroid gland and thoracic inlet are unremarkable. Lungs/Pleura: Mild subpleural thickening noted adjacent the contiguous right-sided rib fractures. No convincing parenchymal contusion, laceration nor effusion or pneumothorax. Atelectatic changes present in the lungs likely exacerbated by splinting. No consolidation or convincing features of edema. Mild bronchiectatic changes in the medial lung base are new from prior given  the esophageal findings could reflect sequela of prior aspiration or bronchitic change. Upper Abdomen: No acute abnormalities present in the visualized portions of the upper abdomen. Extensive atherosclerotic calcification of the upper abdomen. Musculoskeletal: Displaced and mildly comminuted right fourth and fifth rib fractures with additional nondisplaced sixth and seventh acute rib fractures as well. Additional remote anterolateral eighth ninth and posterolateral tenth rib fractures are noted as well. There is a lucency extending through the anterior cortex and endplates of the L1 vertebral body with progressive height loss and increasing sclerosis from comparison CT now with up to 85% height loss anteriorly and some mild posterior height loss and retropulsion of the superior endplate resulting in moderate to severe canal stenosis. Progressive focal kyphotic curvature at this level. Additional new subacute to chronic appearing anterior compression deformity at T12 with up to 30% height loss anteriorly. Stable compression deformities elsewhere in the spine at T5, T3 and T2. Findings on a background of diffuse multilevel discogenic and facet degenerative changes. IMPRESSION: 1. Displaced and mildly comminuted right fourth and fifth rib fractures with additional nondisplaced sixth and seventh acute rib fractures as well. Mild subpleural thickening adjacent the contiguous right-sided rib fractures. No convincing parenchymal contusion, laceration or effusion or pneumothorax. 2. Additional remote anterolateral  eighth ninth and posterolateral tenth rib fractures are noted as well. 3. L1 vertebral body fracture with progressive height loss and increasing sclerosis from comparison CT with a possibly acute on subacute fracture lucency with up to 85% height loss anteriorly and some mild posterior height loss and retropulsion of the superior endplate resulting in moderate to severe canal stenosis. Correlate for point  tenderness. 4. Additional subacute to chronic appearing anterior compression deformity at T12 with up to 30% height loss anteriorly. 5. Stable compression deformities elsewhere in the spine at T5, T3 and T2. 6. Patulous, fluid-filled thoracic esophagus with moderate hiatal hernia. 7. Mild bronchiectatic changes in the medial lung bases are new from prior given the esophageal findings could reflect sequela of prior aspiration. Correlate for aspiration risk. 8. Extensive coronary artery calcifications. 9. Aortic Atherosclerosis (ICD10-I70.0). Electronically Signed   By: Lovena Le M.D.   On: 11/29/2019 22:34   CT CERVICAL SPINE WO CONTRAST  Result Date: 11/30/2019 CLINICAL DATA:  Head trauma, minor. Additional provided: Reported loss of consciousness with fall. EXAM: CT CERVICAL SPINE WITHOUT CONTRAST TECHNIQUE: Multidetector CT imaging of the cervical spine was performed without intravenous contrast. Multiplanar CT image reconstructions were also generated. COMPARISON:  Chest CT 01/05/2015 FINDINGS: Alignment: Straightening of the expected cervical lordosis. C7-T1 grade 1 anterolisthesis. Skull base and vertebrae: The basion-dental and atlanto-dental intervals are maintained.No evidence of acute fracture to the cervical spine. Redemonstrated mild chronic T1 compression deformity without progressive height loss as compared to the prior chest CT of 01/05/2015. Soft tissues and spinal canal: No prevertebral fluid or swelling. No visible canal hematoma. Disc levels: Cervical spondylosis. Most notably, there is advanced disc space narrowing at the C3-C4 through C6-C7 levels with multilevel degenerative endplate irregularity and endplate sclerosis. Multilevel uncovertebral and facet hypertrophy. No high-grade bony spinal canal stenosis. Upper chest: No consolidation within the imaged lung apices. No visible pneumothorax. Other: Calcified atherosclerotic plaque within the carotid arteries. IMPRESSION: 1. No evidence  of acute fracture to the cervical spine. 2. Mild chronic T1 vertebral compression deformity without progressive height loss as compared to the chest CT of 01/05/2015. 3. C7-T1 grade 1 anterolisthesis. 4. Cervical spondylosis as outlined. Electronically Signed   By: Kellie Simmering DO   On: 11/30/2019 14:45   MR BRAIN WO CONTRAST  Result Date: 11/30/2019 CLINICAL DATA:  Initial evaluation for focal neural deficit, history of fall. EXAM: MRI HEAD WITHOUT CONTRAST TECHNIQUE: Multiplanar, multiecho pulse sequences of the brain and surrounding structures were obtained without intravenous contrast. COMPARISON:  Comparison made with prior CT from 11/29/2019 as well as previous MRI from 06/11/2019. FINDINGS: Brain: Generalized age-related cerebral atrophy. Patchy T2/FLAIR hyperintensity within the periventricular deep white matter both cerebral hemispheres as well as the pons, most consistent with chronic small vessel ischemic disease, mild to moderate in nature. There has been interval evolution of previously seen parenchymal hemorrhage involving the left cerebellum and cerebellar vermis, now chronic in appearance. Associated chronic hemosiderin staining seen throughout this region. Scattered foci of cystic encephalomalacia with no definite underlying lesion as previously question on prior MRI. Associated mild ex vacuo dilatation of the fourth ventricle. Small amount of chronic hemosiderin staining also noted at the posterior left temporoparietal region. Previously identified acute subdural hematoma again seen overlying the right cerebral convexity, most pronounced at the right frontal lobe. This measures up to 13 mm in maximal thickness at the level of the right frontal convexity with mild mass effect on the adjacent right frontal lobe. No significant midline shift.  Extension along the falx with a small 4 mm parafalcine component noted as well. No evidence for acute or subacute infarct. Gray-white matter differentiation  otherwise maintained. No other areas of chronic cortical infarction. No mass lesion. Ventricles normal size without hydrocephalus. Cavum et septum pellucidum noted. Vascular: Major intracranial vascular flow voids are maintained. Skull and upper cervical spine: Craniocervical junction within normal limits. Bone marrow signal intensity normal. No visible scalp soft tissue abnormality. Sinuses/Orbits: Patient status post bilateral ocular lens replacement. Globes orbital soft tissues demonstrate no acute finding. Paranasal sinuses are largely clear. No significant mastoid effusion. Inner ear structures grossly normal. Other: None. IMPRESSION: 1. Acute subdural hematoma overlying the right cerebral convexity, measuring up to 13 mm in maximal thickness at the level of the right frontal convexity. Mild mass effect on the subjacent right frontal lobe without significant midline shift. 2. No other acute intracranial abnormality. 3. Interval evolution of previously seen parenchymal hemorrhage involving the left cerebellum and cerebellar vermis, now chronic in appearance. Scattered foci of cystic encephalomalacia without definite underlying lesion as previously questioned on prior MRI. 4. Underlying age-related cerebral atrophy with mild-to-moderate chronic small vessel ischemic disease. Electronically Signed   By: Jeannine Boga M.D.   On: 11/30/2019 02:03   US Carotid Bilateral (at [facility] and [facility] only)  Result Date: 11/30/2019 CLINICAL DATA:  79 year old female with a history of syncope EXAM: BILATERAL CAROTID DUPLEX ULTRASOUND TECHNIQUE: Pearline Cables scale imaging, color Doppler and duplex ultrasound were performed of bilateral carotid and vertebral arteries in the neck. COMPARISON:  None. FINDINGS: Criteria: Quantification of carotid stenosis is based on velocity parameters that correlate the residual internal carotid diameter with NASCET-based stenosis levels, using the diameter of the distal internal carotid lumen as the  denominator for stenosis measurement. The following velocity measurements were obtained: RIGHT ICA:  Systolic 63 cm/sec, Diastolic 21 cm/sec CCA:  59 cm/sec SYSTOLIC ICA/CCA RATIO:  1.5 ECA:  48 cm/sec LEFT ICA:  Systolic 75 cm/sec, Diastolic 19 cm/sec CCA:  72 cm/sec SYSTOLIC ICA/CCA RATIO:  1.6 ECA:  60 cm/sec Right Brachial SBP: Not acquired Left Brachial SBP: Not acquired RIGHT CAROTID ARTERY: No significant calcifications of the right common carotid artery. Intermediate waveform maintained. Heterogeneous and partially calcified plaque at the right carotid bifurcation. No significant lumen shadowing. Low resistance waveform of the right ICA. No significant tortuosity. RIGHT VERTEBRAL ARTERY: Antegrade flow with low resistance waveform. LEFT CAROTID ARTERY: No significant calcifications of the left common carotid artery. Intermediate waveform maintained. Heterogeneous and partially calcified plaque at the left carotid bifurcation without significant lumen shadowing. Low resistance waveform of the left ICA. No significant tortuosity. LEFT VERTEBRAL ARTERY:  Antegrade flow with low resistance waveform. IMPRESSION: Color duplex indicates minimal heterogeneous and calcified plaque, with no hemodynamically significant stenosis by duplex criteria in the extracranial cerebrovascular circulation. Signed, Dulcy Fanny. Dellia Nims, RPVI [facility] Electronically Signed   By: Corrie Mckusick D.O.   On: 11/30/2019 07:58   ECHOCARDIOGRAM COMPLETE  Result Date: 11/30/2019    ECHOCARDIOGRAM REPORT   Patient Name:   Tanya Knight Date of Exam: 11/30/2019 Medical Rec #:  [id number]       Height:       61.0 in Accession #:    [id number]      Weight:       113.0 lb Date of Birth:  1940-11-07       BSA:          1.482 m Patient Age:  78 years        BP:           158/86 mmHg Patient Gender: F               HR:           58 bpm. Exam Location:  [facility] Procedure: 2D Echo, Color  Doppler and Cardiac Doppler Indications:     Syncope 780.2  History:         Patient has prior history of Echocardiogram examinations, most                  recent 06/10/2019. Stroke; Risk Factors:Hypertension.  Sonographer:     Sherrie Sport RDCS (AE) Referring Phys:  [place] Diagnosing Phys: Serafina Royals MD IMPRESSIONS  1. Left ventricular ejection fraction, by estimation, is 50 to 55%. The left ventricle has low normal function. The left ventricle has no regional wall motion abnormalities. Left ventricular diastolic parameters were normal.  2. Right ventricular systolic function is normal. The right ventricular size is normal. There is normal pulmonary artery systolic pressure.  3. The mitral valve is normal in structure. Mild mitral valve regurgitation.  4. The aortic valve is normal in structure. Aortic valve regurgitation is mild. Mild to moderate aortic valve sclerosis/calcification is present, without any evidence of aortic stenosis. FINDINGS  Left Ventricle: Left ventricular ejection fraction, by estimation, is 50 to 55%. The left ventricle has low normal function. The left ventricle has no regional wall motion abnormalities. The left ventricular internal cavity size was normal in size. There is no left ventricular hypertrophy. Left ventricular diastolic parameters were normal. Right Ventricle: The right ventricular size is normal. No increase in right ventricular wall thickness. Right ventricular systolic function is normal. There is normal pulmonary artery systolic pressure. The tricuspid regurgitant velocity is 1.76 m/s, and  with an assumed right atrial pressure of 10 mmHg, the estimated right ventricular systolic pressure is 96.7 mmHg. Left Atrium: Left atrial size was normal in size. Right Atrium: Right atrial size was normal in size. Pericardium: There is no evidence of pericardial effusion. Mitral Valve: The mitral valve is normal in structure. Mild mitral valve regurgitation.  Tricuspid Valve: The tricuspid valve is normal in structure. Tricuspid valve regurgitation is mild. Aortic Valve: The aortic valve is normal in structure. Aortic valve regurgitation is mild. Mild to moderate aortic valve sclerosis/calcification is present, without any evidence of aortic stenosis. Aortic valve mean gradient measures 5.0 mmHg. Aortic valve peak gradient measures 8.9 mmHg. Aortic valve area, by VTI measures 2.66 cm. Pulmonic Valve: The pulmonic valve was normal in structure. Pulmonic valve regurgitation is trivial. Aorta: The aortic root and ascending aorta are structurally normal, with no evidence of dilitation. IAS/Shunts: No atrial level shunt detected by color flow Doppler.  LEFT VENTRICLE PLAX 2D LVIDd:         3.34 cm  Diastology LVIDs:         2.40 cm  LV e' lateral:   4.24 cm/s LV PW:         1.05 cm  LV E/e' lateral: 15.0 LV IVS:        0.82 cm  LV e' medial:    3.59 cm/s LVOT diam:     2.00 cm  LV E/e' medial:  17.8 LV SV:         86 LV SV Index:   58 LVOT Area:     3.14 cm  RIGHT VENTRICLE RV Basal diam:  3.20 cm RV S prime:     15.20 cm/s TAPSE (M-mode): 2.8 cm LEFT ATRIUM             Index       RIGHT ATRIUM           Index LA diam:        2.30 cm 1.55 cm/m  RA Area:     18.40 cm LA Vol (A2C):   41.5 ml 28.01 ml/m RA Volume:   50.10 ml  33.81 ml/m LA Vol (A4C):   32.8 ml 22.13 ml/m LA Biplane Vol: 37.4 ml 25.24 ml/m  AORTIC VALVE                    PULMONIC VALVE AV Area (Vmax):    2.66 cm     PV Vmax:        0.80 m/s AV Area (Vmean):   2.57 cm     PV Peak grad:   2.5 mmHg AV Area (VTI):     2.66 cm     RVOT Peak grad: 3 mmHg AV Vmax:           149.00 cm/s AV Vmean:          105.050 cm/s AV VTI:            0.322 m AV Peak Grad:      8.9 mmHg AV Mean Grad:      5.0 mmHg LVOT Vmax:         126.00 cm/s LVOT Vmean:        86.100 cm/s LVOT VTI:          0.273 m LVOT/AV VTI ratio: 0.85  AORTA Ao Root diam: 3.00 cm MITRAL VALVE               TRICUSPID VALVE MV Area (PHT): 3.21 cm     TR Peak grad:   12.4 mmHg MV Decel Time: 236 msec    TR Vmax:        176.00 cm/s MV E velocity: 63.80 cm/s MV A velocity: 95.10 cm/s  SHUNTS MV E/A ratio:  0.67        Systemic VTI:  0.27 m                            Systemic Diam: 2.00 cm Serafina Royals MD Electronically signed by Serafina Royals MD Signature Date/Time: 11/30/2019/5:30:19 PM    Final       Subjective: Patient seen and examined bedside, resting comfortably. Daughter-in-law present. No complaints this morning. Seen by neurology and neurosurgery and okay for discharge home with outpatient follow-up. Discussed need to stop aspirin for the next 7 days and to avoid NSAIDs. Denies headache, no fever/chills/night sweats, no nausea cefonicid diarrhea, no chest pain, no palpitations, no shortness of breath, no abdominal pain, no weakness, no fatigue, no paresthesias. No acute events overnight per nursing staff.  Discharge Exam: Vitals:   12/01/19 0733 12/01/19 1141  BP: (!) 146/77 (!) 144/74  Pulse: 65 72  Resp: 16 17  Temp: 98 F (36.7 C) 98.3 F (36.8 C)  SpO2: 98% 100%   Vitals:   11/30/19 2041 12/01/19 0453 12/01/19 0733 12/01/19 1141  BP:  128/79 (!) 146/77 (!) 144/74  Pulse:  64 65 72  Resp:   16 17  Temp:  98.4 F (36.9 C) 98 F (36.7 C) 98.3 F (36.8 C)  TempSrc:  Oral Oral  SpO2: 97% 97% 98% 100%  Weight:  53.3 kg    Height:        General: Pt is alert, awake, not in acute distress Cardiovascular: RRR, S1/S2 +, no rubs, no gallops Respiratory: CTA bilaterally, no wheezing, no rhonchi Abdominal: Soft, NT, ND, bowel sounds + Extremities: no edema, no cyanosis    The results of significant diagnostics from this hospitalization (including imaging, microbiology, ancillary and laboratory) are listed below for reference.     Microbiology: Recent Results (from the past 240 hour(s))  SARS Coronavirus 2 by RT PCR (hospital order, performed in [facility] hospital lab) Nasopharyngeal Nasopharyngeal Swab     Status:  None   Collection Time: 11/29/19 10:08 PM   Specimen: Nasopharyngeal Swab  Result Value Ref Range Status   SARS Coronavirus 2 NEGATIVE NEGATIVE Final    Comment: (NOTE) SARS-CoV-2 target nucleic acids are NOT DETECTED.  The SARS-CoV-2 RNA is generally detectable in upper and lower respiratory specimens during the acute phase of infection. The lowest concentration of SARS-CoV-2 viral copies this assay can detect is 250 copies / mL. A negative result does not preclude SARS-CoV-2 infection and should not be used as the sole basis for treatment or other patient management decisions.  A negative result may occur with improper specimen collection / handling, submission of specimen other than nasopharyngeal swab, presence of viral mutation(s) within the areas targeted by this assay, and inadequate number of viral copies (<250 copies / mL). A negative result must be combined with clinical observations, patient history, and epidemiological information.  Fact Sheet for Patients:   StrictlyIdeas.no  Fact Sheet for Healthcare Providers: BankingDealers.co.za  This test is not yet approved or  cleared by the [place] FDA and has been authorized for detection and/or diagnosis of SARS-CoV-2 by FDA under an Emergency Use Authorization (EUA).  This EUA will remain in effect (meaning this test can be used) for the duration of the COVID-19 declaration under Section 564(b)(1) of the Act, 21 U.S.C. section 360bbb-3(b)(1), unless the authorization is terminated or revoked sooner.  Performed at [facility], [address] [place], [place] [address]      Labs: BNP (last 3 results) No results for input(s): BNP in the last 8760 hours. Basic Metabolic Panel: Recent Labs  Lab 11/29/19 2115  NA 133*  K 3.9  CL 94*  CO2 30  GLUCOSE 124*  BUN 20  CREATININE 0.67  CALCIUM 9.7   Liver Function Tests: Recent Labs  Lab 11/29/19 2115   AST 21  ALT 13  ALKPHOS 69  BILITOT 0.7  PROT 7.1  ALBUMIN 4.2   No results for input(s): LIPASE, AMYLASE in the last 168 hours. No results for input(s): AMMONIA in the last 168 hours. CBC: Recent Labs  Lab 11/29/19 2115  WBC 6.6  NEUTROABS 4.5  HGB 14.7  HCT 43.6  MCV 92.4  PLT 246   Cardiac Enzymes: No results for input(s): CKTOTAL, CKMB, CKMBINDEX, TROPONINI in the last 168 hours. BNP: Invalid input(s): POCBNP CBG: No results for input(s): GLUCAP in the last 168 hours. D-Dimer No results for input(s): DDIMER in the last 72 hours. Hgb A1c Recent Labs    11/30/19 0722  HGBA1C 5.2   Lipid Profile Recent Labs    11/30/19 0722  CHOL 193  HDL 69  LDLCALC 115*  TRIG 45  CHOLHDL 2.8   Thyroid function studies No results for input(s): TSH, T4TOTAL, T3FREE, THYROIDAB in the last 72 hours.  Invalid input(s): FREET3  Anemia work up No results for input(s): VITAMINB12, FOLATE, FERRITIN, TIBC, IRON, RETICCTPCT in the last 72 hours. Urinalysis    Component Value Date/Time   COLORURINE YELLOW (A) 11/29/2019 2334   APPEARANCEUR CLOUDY (A) 11/29/2019 2334   LABSPEC 1.013 11/29/2019 2334   PHURINE 7.0 11/29/2019 2334   GLUCOSEU NEGATIVE 11/29/2019 2334   HGBUR NEGATIVE 11/29/2019 2334   BILIRUBINUR NEGATIVE 11/29/2019 2334   BILIRUBINUR Negatvie 11/07/2019 1412   KETONESUR 5 (A) 11/29/2019 2334   PROTEINUR NEGATIVE 11/29/2019 2334   UROBILINOGEN 0.2 11/07/2019 1412   NITRITE NEGATIVE 11/29/2019 2334   LEUKOCYTESUR NEGATIVE 11/29/2019 2334   Sepsis Labs Invalid input(s): PROCALCITONIN,  WBC,  LACTICIDVEN Microbiology Recent Results (from the past 240 hour(s))  SARS Coronavirus 2 by RT PCR (hospital order, performed in [place] hospital lab) Nasopharyngeal Nasopharyngeal Swab     Status: None   Collection Time: 11/29/19 10:08 PM   Specimen: Nasopharyngeal Swab  Result Value Ref Range Status   SARS Coronavirus 2 NEGATIVE NEGATIVE Final    Comment:  (NOTE) SARS-CoV-2 target nucleic acids are NOT DETECTED.  The SARS-CoV-2 RNA is generally detectable in upper and lower respiratory specimens during the acute phase of infection. The lowest concentration of SARS-CoV-2 viral copies this assay can detect is 250 copies / mL. A negative result does not preclude SARS-CoV-2 infection and should not be used as the sole basis for treatment or other patient management decisions.  A negative result may occur with improper specimen collection / handling, submission of specimen other than nasopharyngeal swab, presence of viral mutation(s) within the areas targeted by this assay, and inadequate number of viral copies (<250 copies / mL). A negative result must be combined with clinical observations, patient history, and epidemiological information.  Fact Sheet for Patients:   StrictlyIdeas.no  Fact Sheet for Healthcare Providers: BankingDealers.co.za  This test is not yet approved or  cleared by the [place] FDA and has been authorized for detection and/or diagnosis of SARS-CoV-2 by FDA under an Emergency Use Authorization (EUA).  This EUA will remain in effect (meaning this test can be used) for the duration of the COVID-19 declaration under Section 564(b)(1) of the Act, 21 U.S.C. section 360bbb-3(b)(1), unless the authorization is terminated or revoked sooner.  Performed at [facility], [address] [place], [place] [address]      Time coordinating discharge: Over 30 minutes  SIGNED:   Andree Heeg J [place] DO  [facility] 12/01/2019, 2:45 PM

## 2019-12-01 NOTE — Progress Notes (Signed)
Patient verifies she has home health through [facility]. RNCM reached out to [place] with [facility] to inform her patient is discharging.

## 2019-12-01 NOTE — Progress Notes (Signed)
Pt off to CT scan now

## 2019-12-01 NOTE — Evaluation (Signed)
Occupational Therapy Evaluation Patient Details Name: Tanya Knight MRN: [id number] DOB: 05/07/1941 Today's Date: 12/01/2019    History of Present Illness Tanya Knight is an 79 y.o. female with medical history significant of hemorrhagic cerebellar CVA in 05/2019, seizure disorder on depakote, GERD, hypertension, osteoporosis who presented after a syncopal episode that happened around 7 PM on 11/29/19. Family noticed that she lost consciousness and fell to her right side. In the ED, CT head with right frontal subdural 5 mm with no shift.  Chest x-ray with right-sided rib fractures.  CT chest with displaced/comminuted right fourth/fifth rib fracture, 6/seventh rib fracture, L1 vertebral body fracture.  Follow up CT imaging shows no change in SDH.   Clinical Impression   Tanya Knight was seen for OT evaluation this date. Prior to hospital admission, pt was living at home with her spouse and receiving HHOT/PT for ongoing therapy s/p her stroke in January of 2021. Pt reports she has been making good progress with home health and has gotten to be modified independent in all BADL management including dressing, bathing and functional mobility. She uses a 2WW for all mobility needs and states she's been able to take "about a 20 minute walk" with physical therapy at home. Pt presents to OT today with increased rib pain, decreased balance, and generalized weakness, which functionally limit her ability to perform ADL tasks. Pt currently requires supervision for safety during standing grooming and functional mobility. OT educates pt on safe transfer techniques, and compensatory strategies to maximize safety and minimize rib pain. Pt would benefit from skilled OT services to address noted impairments and functional limitations (see below for any additional details) in order to maximize safety and independence while minimizing falls risk and caregiver burden. Upon hospital discharge, recommend HHOT to maximize pt safety  and return to functional independence during meaningful occupations of daily life.    Follow Up Recommendations  Home health OT    Equipment Recommendations  None recommended by OT (Pt has necessary equipment.)    Recommendations for Other Services       Precautions / Restrictions Precautions Precautions: Fall Precaution Comments: High fall; per neurosurgery please maintain BP < 696 systolic. Restrictions Weight Bearing Restrictions: No      Mobility Bed Mobility Overal bed mobility: Needs Assistance Bed Mobility: Supine to Sit     Supine to sit: Supervision        Transfers Overall transfer level: Needs assistance Equipment used: Rolling walker (2 wheeled) Transfers: Sit to/from Stand Sit to Stand: Supervision         General transfer comment: Pt perfoms STS from bed in lowest position with good safety awareness and technique.    Balance Overall balance assessment: Needs assistance Sitting-balance support: Feet supported;No upper extremity supported Sitting balance-Leahy Scale: Good Sitting balance - Comments: Steady static sitting, reaching within BOS.   Standing balance support: During functional activity;Single extremity supported;No upper extremity supported Standing balance-Leahy Scale: Fair Standing balance comment: Pt generally maintains 1 UE support during static standing tasks. she is able to stand briefly w/o BUE support, but when reaching outside BOS keeps one hand on stable surface.                           ADL either performed or assessed with clinical judgement   ADL Overall ADL's : Needs assistance/impaired  General ADL Comments: Supervision for safety during standing grooming tasks at sink. Pt is able to perform oral care with 1 UE support on counter/RW and fair standing balance. She is able to reach outside BOS for items on counter. Pt performs bed/functional mobility with  supervision and min cueing for sequencing to maximize comfort.     Vision Baseline Vision/History:  (Pt does not wear glasses, hx of impaired visual tracking, stabilization, and pursuits.) Patient Visual Report: No change from baseline       Perception     Praxis      Pertinent Vitals/Pain Pain Assessment: Faces Faces Pain Scale: Hurts even more Pain Location: Rib pain Pain Descriptors / Indicators: Constant;Sore;Grimacing;Guarding Pain Intervention(s): Limited activity within patient's tolerance;Monitored during session;Repositioned     Hand Dominance Right   Extremity/Trunk Assessment Upper Extremity Assessment Upper Extremity Assessment: Generalized weakness   Lower Extremity Assessment Lower Extremity Assessment: Generalized weakness   Cervical / Trunk Assessment Cervical / Trunk Assessment: Other exceptions (forward head/shoulders)   Communication Communication Communication: No difficulties   Cognition Arousal/Alertness: Awake/alert Behavior During Therapy: WFL for tasks assessed/performed Overall Cognitive Status: Within Functional Limits for tasks assessed                                 General Comments: Pt is pleasant, conversational, eager to participate in therapy session.   General Comments  Pt denies adverse s/s (e.g. dizziness, lightheadedness, etc.) t/o session.    Exercises Other Exercises Other Exercises: Pt educated on falls prevention strategies, safe transfer techniques including compensatory bed mobility strateiges for mgt of rib pain, and routines modifications to support safety and funcitonal independence upon hospital DC. Other Exercises: OT facilitates functional mobility and standing grooming tasks as described above. See ADL section for additional detail.   Shoulder Instructions      Home Living Family/patient expects to be discharged to:: Private residence Living Arrangements: Spouse/significant other Available Help at  Discharge: Family;Available 24 hours/day Type of Home: House Home Access: Stairs to enter [employer] of Steps: 3 Entrance Stairs-Rails: Right;Left;Can reach both Home Layout: One level     Bathroom Shower/Tub: Teacher, early years/pre: Standard     Home Equipment: Environmental consultant - 2 wheels;Cane - single point;Transport chair;Shower seat;Bedside commode          Prior Functioning/Environment Level of Independence: Independent with assistive device(s)        Comments: Pt has been working with [facility] OT/PT since coming home from [facility] after stroke in Jan. She reports using a 2WW for all mobility, and has been able to take up to "20 minute walks with PT". Family assists with IADL management. Pt enjoys reading and hopes to get back to driving eventually.        OT Problem List: Decreased strength;Decreased coordination;Pain;Decreased safety awareness;Decreased knowledge of use of DME or AE;Impaired balance (sitting and/or standing)      OT Treatment/Interventions: Self-care/ADL training;Therapeutic exercise;Therapeutic activities;DME and/or AE instruction;Patient/family education;Balance training;Energy conservation    OT Goals(Current goals can be found in the care plan section) Acute Rehab OT Goals Patient Stated Goal: To go home OT Goal Formulation: With patient Time For Goal Achievement: 12/15/19 Potential to Achieve Goals: Good ADL Goals Pt Will Perform Grooming: with modified independence;standing;with adaptive equipment (c LRAD PRn for improved safety and functional indep.) Pt Will Transfer to Toilet: ambulating;with modified independence;bedside commode (c LRAD PRn for improved safety and functional indep.) Pt  Will Perform Toileting - Clothing Manipulation and hygiene: with modified independence;sit to/from stand (c LRAD PRn for improved safety and functional indep.) Additional ADL Goal #1: Pt will independently verbalize a plan to implement at least 3 learned  falls prevention strategies into her daily routines/home environment.  OT Frequency: Min 1X/week   Barriers to D/C:            Co-evaluation              AM-PAC OT "6 Clicks" Daily Activity     Outcome Measure Help from another person eating meals?: None Help from another person taking care of personal grooming?: A Little Help from another person toileting, which includes using toliet, bedpan, or urinal?: A Little Help from another person bathing (including washing, rinsing, drying)?: A Little Help from another person to put on and taking off regular upper body clothing?: A Little Help from another person to put on and taking off regular lower body clothing?: A Little 6 Click Score: 19   End of Session Equipment Utilized During Treatment: Gait belt;Rolling walker Nurse Communication: Mobility status;Other (comment) (IV alerting)  Activity Tolerance: Patient tolerated treatment well Patient left: in chair;with call bell/phone within reach;with chair alarm set  OT Visit Diagnosis: Other abnormalities of gait and mobility (R26.89);Pain Pain - Right/Left:  (Both) Pain - part of body:  (Ribs)                Time: 1657-9038 OT Time Calculation (min): 33 min Charges:  OT General Charges $OT Visit: 1 Visit OT Evaluation $OT Eval Moderate Complexity: 1 Mod OT Treatments $Self Care/Home Management : 23-37 mins  Shara Blazing, M.S., OTR/L Ascom: [phone] 12/01/19, 2:56 PM

## 2019-12-01 NOTE — Progress Notes (Signed)
Subjective: Patient reports feeling well today.  No new neurological complaints.  No further syncopal events.    Objective: Current vital signs: BP (!) 144/74 (BP Location: Right Arm)   Pulse 72   Temp 98.3 F (36.8 C)   Resp 17   Ht 5\' 1"  (1.549 m)   Wt 53.3 kg   SpO2 100%   BMI 22.22 kg/m  Vital signs in last 24 hours: Temp:  [97.6 F (36.4 C)-98.4 F (36.9 C)] 98.3 F (36.8 C) (07/22 1141) Pulse Rate:  [64-72] 72 (07/22 1141) Resp:  [16-18] 17 (07/22 1141) BP: (128-156)/(70-79) 144/74 (07/22 1141) SpO2:  [97 %-100 %] 100 % (07/22 1141) Weight:  [53.3 kg] 53.3 kg (07/22 0453)  Intake/Output from previous day: 07/21 0701 - 07/22 0700 In: 1820.8 [P.O.:120; I.V.:1700.8] Out: 1125 [Urine:1125] Intake/Output this shift: Total I/O In: -  Out: 200 [Urine:200] Nutritional status:  Diet Order            Diet regular Room service appropriate? Yes; Fluid consistency: Thin  Diet effective now                 Neurologic Exam: Mental Status: Alert, oriented, thought content appropriate.  Speech fluent without evidence of aphasia.  Able to follow 3 step commands without difficulty. Cranial Nerves: II: Discs flat bilaterally; Visual fields grossly normal, pupils equal, round, reactive to light and accommodation III,IV, VI: ptosis not present, extra-ocular motions intact bilaterally V,VII: smile symmetric, facial light touch sensation normal bilaterally VIII: hearing normal bilaterally IX,X: gag reflex present XI: bilateral shoulder shrug XII: midline tongue extension Motor: 5/5 throughout Sensory: Pinprick and light touch intact throughout, bilaterally    Lab Results: Basic Metabolic Panel: Recent Labs  Lab 11/29/19 2115  NA 133*  K 3.9  CL 94*  CO2 30  GLUCOSE 124*  BUN 20  CREATININE 0.67  CALCIUM 9.7    Liver Function Tests: Recent Labs  Lab 11/29/19 2115  AST 21  ALT 13  ALKPHOS 69  BILITOT 0.7  PROT 7.1  ALBUMIN 4.2   No results for  input(s): LIPASE, AMYLASE in the last 168 hours. No results for input(s): AMMONIA in the last 168 hours.  CBC: Recent Labs  Lab 11/29/19 2115  WBC 6.6  NEUTROABS 4.5  HGB 14.7  HCT 43.6  MCV 92.4  PLT 246    Cardiac Enzymes: No results for input(s): CKTOTAL, CKMB, CKMBINDEX, TROPONINI in the last 168 hours.  Lipid Panel: Recent Labs  Lab 11/30/19 0722  CHOL 193  TRIG 45  HDL 69  CHOLHDL 2.8  VLDL 9  LDLCALC 115*    CBG: No results for input(s): GLUCAP in the last 168 hours.  Microbiology: Results for orders placed or performed during the hospital encounter of 11/29/19  SARS Coronavirus 2 by RT PCR (hospital order, performed in [facility] hospital lab) Nasopharyngeal Nasopharyngeal Swab     Status: None   Collection Time: 11/29/19 10:08 PM   Specimen: Nasopharyngeal Swab  Result Value Ref Range Status   SARS Coronavirus 2 NEGATIVE NEGATIVE Final    Comment: (NOTE) SARS-CoV-2 target nucleic acids are NOT DETECTED.  The SARS-CoV-2 RNA is generally detectable in upper and lower respiratory specimens during the acute phase of infection. The lowest concentration of SARS-CoV-2 viral copies this assay can detect is 250 copies / mL. A negative result does not preclude SARS-CoV-2 infection and should not be used as the sole basis for treatment or other patient management decisions.  A negative result  may occur with improper specimen collection / handling, submission of specimen other than nasopharyngeal swab, presence of viral mutation(s) within the areas targeted by this assay, and inadequate number of viral copies (<250 copies / mL). A negative result must be combined with clinical observations, patient history, and epidemiological information.  Fact Sheet for Patients:   StrictlyIdeas.no  Fact Sheet for Healthcare Providers: BankingDealers.co.za  This test is not yet approved or  cleared by the [place] FDA  and has been authorized for detection and/or diagnosis of SARS-CoV-2 by FDA under an Emergency Use Authorization (EUA).  This EUA will remain in effect (meaning this test can be used) for the duration of the COVID-19 declaration under Section 564(b)(1) of the Act, 21 U.S.C. section 360bbb-3(b)(1), unless the authorization is terminated or revoked sooner.  Performed at [facility], [address] [place], [place] [address]     Coagulation Studies: Recent Labs    11/29/19 08-26-13  LABPROT 12.5  INR 1.0    Imaging: EEG  Result Date: 11/30/2019 Alexis Goodell, MD     11/30/2019  1:33 PM ELECTROENCEPHALOGRAM REPORT Patient: Tanya Knight       Room #: QV95G EEG No. ID: [id number] Age: 79 y.o.        Sex: female Requesting Physician: [place] Report Date:  11/30/2019       Interpreting Physician: Alexis Goodell History: Tanya Knight is an 79 y.o. female with syncope Medications: Depakote Conditions of Recording:  This is a 21 channel routine scalp EEG performed with bipolar and monopolar montages arranged in accordance to the international 10/20 system of electrode placement. One channel was dedicated to EKG recording. The patient is in the awake, drowsy and asleep states. Description:  The waking background activity consists of a low voltage, symmetrical, fairly well organized, 8 Hz alpha activity, seen from the parieto-occipital and posterior temporal regions.  Low voltage fast activity, poorly organized, is seen anteriorly and is at times superimposed on more posterior regions.  A mixture of theta and alpha rhythms are seen from the central and temporal regions. The patient drowses with slowing to irregular, low voltage theta and beta activity.  The patient goes in to a light sleep with symmetrical sleep spindles, vertex central sharp transients and irregular slow activity.  No epileptiform activity is noted.  Hyperventilation was not performed.  Intermittent photic stimulation was  performed but failed to illicit any change in the tracing.  IMPRESSION: Normal electroencephalogram, awake, asleep and with activation procedures. There are no focal lateralizing or epileptiform features. Alexis Goodell, MD Neurology [phone] 11/30/2019, 1:32 PM   CT ANGIO HEAD W OR WO CONTRAST  Result Date: 11/30/2019 CLINICAL DATA:  Subdural hematoma EXAM: CT ANGIOGRAPHY HEAD TECHNIQUE: Multidetector CT imaging of the head was performed using the standard protocol during bolus administration of intravenous contrast. Multiplanar CT image reconstructions and MIPs were obtained to evaluate the vascular anatomy. CONTRAST:  27mL OMNIPAQUE IOHEXOL 350 MG/ML SOLN COMPARISON:  06/10/2019 FINDINGS: CTA HEAD Anterior circulation: Intracranial internal carotid arteries are patent with mild calcified plaque. A 2 mm inferiorly directed aneurysm from may distal supraclinoid right ICA is unchanged. Anterior and middle cerebral arteries patent. Posterior circulation: Intracranial vertebral arteries, basilar artery, and posterior cerebral arteries are patent. A left posterior communicating arteries present with fetal or near fetal origin of the PCA. Venous sinuses: Not well evaluated. 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 No acute findings. Electronically Signed   By: Macy Mis  M.D.   On: 11/30/2019 14:26   DG Chest 2 View  Result Date: 11/29/2019 CLINICAL DATA:  Rib pain, fall, EXAM: CHEST - 2 VIEW COMPARISON:  06/15/2019 FINDINGS: There is an acute fracture of the right fourth rib anterolaterally. Several remote right rib fractures are also noted. The lungs are clear. No pneumothorax. Tiny right pleural effusion is present. Small hiatal hernia is noted. Cardiac size within normal limits. The pulmonary vascularity is normal. There is progressive loss of height involving the T12 compression fracture with new anterior wedge compression fracture of probable T11 since prior  examination, though exact localization is difficult due to overlying metallic artifact. IMPRESSION: Acute right fourth rib anterolateral fracture.  No pneumothorax. Multiple thoracolumbar compression fractures, new and progressive since prior examination, age indeterminate. These could be better assessed with MRI examination, particularly if percutaneous vertebral stabilization is considered as a therapeutic option. Electronically Signed   By: Fidela Salisbury MD   On: 11/29/2019 21:34   CT HEAD WO CONTRAST  Result Date: 12/01/2019 CLINICAL DATA:  Subdural hematoma. EXAM: CT HEAD WITHOUT CONTRAST TECHNIQUE: Contiguous axial images were obtained from the base of the skull through the vertex without intravenous contrast. COMPARISON:  11/30/2019 FINDINGS: Brain: Acute subdural hematoma overlying the right cerebral convexity is unchanged in size measuring up to 9 mm in thickness anterior to the frontal lobe. There is very mild mass effect on the underlying right frontal lobe with unchanged trace leftward midline shift. Small volume subdural hematoma anteriorly along the falx is also unchanged. No new intracranial hemorrhage, acute infarct, or mass is identified. Mild cerebral atrophy is noted. Hypodensities in the cerebral white matter bilaterally are unchanged and nonspecific but compatible with mild chronic small vessel ischemic disease. Encephalomalacia is again noted in the left cerebellar hemisphere and vermis related to a prior hemorrhage. Vascular: Calcified atherosclerosis at the skull base. No hyperdense vessel. Skull: No fracture or suspicious osseous lesion. Sinuses/Orbits: Visualized paranasal sinuses and mastoid air cells are clear. Bilateral cataract extraction. Other: None. IMPRESSION: 1. Unchanged subdural hematoma over the right cerebral convexity and falx. 2. No evidence of new intracranial abnormality. Electronically Signed   By: Logan Bores M.D.   On: 12/01/2019 06:57   CT Head Wo  Contrast  Result Date: 11/30/2019 CLINICAL DATA:  Fall, recent subdural hemorrhage EXAM: CT HEAD WITHOUT CONTRAST TECHNIQUE: Contiguous axial images were obtained from the base of the skull through the vertex without intravenous contrast. COMPARISON:  MRI brain November 30, 2019 12:56 a.m. FINDINGS: Brain: Again noted is a acute subdural hemorrhage seen overlying the right frontal lobe measuring up to maximum diameter of 8 mm, slightly decreased in size from the recent MRI. There is also a small amount of subdural hemorrhage overlying the superior falx measuring maximum diameter of 3 mm. There is mild mass effect along the right frontal lobe. There is minimal right to leftward shift of 2 mm. There is dilatation the ventricles and sulci consistent with age-related atrophy. Low-attenuation changes in the deep white matter consistent with small vessel ischemia. Area of hypodensity seen within the left cerebellum from prior hemorrhagic stroke. Vascular: No hyperdense vessel or unexpected calcification. Skull: The skull is intact. No fracture or focal lesion identified. Sinuses/Orbits: The visualized paranasal sinuses and mastoid air cells are clear. The orbits and globes intact. Other: None IMPRESSION: 1. Slight interval decrease in the acute subdural hemorrhage overlying the right frontal convexity and superior falx measuring a maximum diameter of 8 mm. Mild mass effect upon the right  frontal lobe. No new extra-axial fluid collections. 2. Findings consistent with age related atrophy and chronic small vessel ischemia Electronically Signed   By: Prudencio Pair M.D.   On: 11/30/2019 03:18   CT HEAD WO CONTRAST  Result Date: 11/29/2019 CLINICAL DATA:  Syncope, loss of consciousness, fell on right side EXAM: CT HEAD WITHOUT CONTRAST TECHNIQUE: Contiguous axial images were obtained from the base of the skull through the vertex without intravenous contrast. COMPARISON:  06/15/2019 FINDINGS: Brain: There is a small right  frontal subdural hematoma measuring up to 5 mm in thickness. There is no significant mass effect or midline shift. No acute infarct. Lateral ventricles and midline structures are stable. Vascular: No hyperdense vessel or unexpected calcification. Skull: Normal. Negative for fracture or focal lesion. Sinuses/Orbits: No acute finding. Other: None. IMPRESSION: 1. Small right frontal subdural hematoma, measuring up to 5 mm in thickness. No significant mass effect or midline shift. These results were called by telephone at the time of interpretation on 11/29/2019 at 9:37 pm to provider [facility] , who verbally acknowledged these results. Electronically Signed   By: Randa Ngo M.D.   On: 11/29/2019 21:37   CT Chest Wo Contrast  Result Date: 11/29/2019 CLINICAL DATA:  Syncopal episode and fall to right side, pain and right ribs EXAM: CT CHEST WITHOUT CONTRAST TECHNIQUE: Multidetector CT imaging of the chest was performed following the standard protocol without IV contrast. COMPARISON:  Radiograph 11/29/2019, CT 01/05/2015 FINDINGS: Cardiovascular: Normal heart size. No pericardial effusion. Extensive coronary artery calcifications are present. Calcifications are present upon the mitral annulus, chordae tendinae, and aortic leaflets. Atherosclerotic plaque within the normal caliber aorta. No abnormal hyperdense mural thickening or plaque displacement to suggest intramural hematoma. No periaortic stranding or hemorrhage. Shared origin of brachiocephalic and left common carotid artery. Minimal plaque in the proximal great vessels and cervical carotids. Central pulmonary arteries are normal caliber. Luminal evaluation of the vasculature is precluded in the absence of contrast media. Mediastinum/Nodes: Patulous, fluid-filled thoracic esophagus with moderate hiatal hernia. No acute abnormality of the trachea. No mediastinal or axillary adenopathy. Hilar nodal evaluation limited in the absence of contrast media. No  mediastinal fluid, hemorrhage or gas. Thyroid gland and thoracic inlet are unremarkable. Lungs/Pleura: Mild subpleural thickening noted adjacent the contiguous right-sided rib fractures. No convincing parenchymal contusion, laceration nor effusion or pneumothorax. Atelectatic changes present in the lungs likely exacerbated by splinting. No consolidation or convincing features of edema. Mild bronchiectatic changes in the medial lung base are new from prior given the esophageal findings could reflect sequela of prior aspiration or bronchitic change. Upper Abdomen: No acute abnormalities present in the visualized portions of the upper abdomen. Extensive atherosclerotic calcification of the upper abdomen. Musculoskeletal: Displaced and mildly comminuted right fourth and fifth rib fractures with additional nondisplaced sixth and seventh acute rib fractures as well. Additional remote anterolateral eighth ninth and posterolateral tenth rib fractures are noted as well. There is a lucency extending through the anterior cortex and endplates of the L1 vertebral body with progressive height loss and increasing sclerosis from comparison CT now with up to 85% height loss anteriorly and some mild posterior height loss and retropulsion of the superior endplate resulting in moderate to severe canal stenosis. Progressive focal kyphotic curvature at this level. Additional new subacute to chronic appearing anterior compression deformity at T12 with up to 30% height loss anteriorly. Stable compression deformities elsewhere in the spine at T5, T3 and T2. Findings on a background of diffuse multilevel discogenic and  facet degenerative changes. IMPRESSION: 1. Displaced and mildly comminuted right fourth and fifth rib fractures with additional nondisplaced sixth and seventh acute rib fractures as well. Mild subpleural thickening adjacent the contiguous right-sided rib fractures. No convincing parenchymal contusion, laceration or effusion or  pneumothorax. 2. Additional remote anterolateral eighth ninth and posterolateral tenth rib fractures are noted as well. 3. L1 vertebral body fracture with progressive height loss and increasing sclerosis from comparison CT with a possibly acute on subacute fracture lucency with up to 85% height loss anteriorly and some mild posterior height loss and retropulsion of the superior endplate resulting in moderate to severe canal stenosis. Correlate for point tenderness. 4. Additional subacute to chronic appearing anterior compression deformity at T12 with up to 30% height loss anteriorly. 5. Stable compression deformities elsewhere in the spine at T5, T3 and T2. 6. Patulous, fluid-filled thoracic esophagus with moderate hiatal hernia. 7. Mild bronchiectatic changes in the medial lung bases are new from prior given the esophageal findings could reflect sequela of prior aspiration. Correlate for aspiration risk. 8. Extensive coronary artery calcifications. 9. Aortic Atherosclerosis (ICD10-I70.0). Electronically Signed   By: Lovena Le M.D.   On: 11/29/2019 22:34   CT CERVICAL SPINE WO CONTRAST  Result Date: 11/30/2019 CLINICAL DATA:  Head trauma, minor. Additional provided: Reported loss of consciousness with fall. EXAM: CT CERVICAL SPINE WITHOUT CONTRAST TECHNIQUE: Multidetector CT imaging of the cervical spine was performed without intravenous contrast. Multiplanar CT image reconstructions were also generated. COMPARISON:  Chest CT 01/05/2015 FINDINGS: Alignment: Straightening of the expected cervical lordosis. C7-T1 grade 1 anterolisthesis. Skull base and vertebrae: The basion-dental and atlanto-dental intervals are maintained.No evidence of acute fracture to the cervical spine. Redemonstrated mild chronic T1 compression deformity without progressive height loss as compared to the prior chest CT of 01/05/2015. Soft tissues and spinal canal: No prevertebral fluid or swelling. No visible canal hematoma. Disc levels:  Cervical spondylosis. Most notably, there is advanced disc space narrowing at the C3-C4 through C6-C7 levels with multilevel degenerative endplate irregularity and endplate sclerosis. Multilevel uncovertebral and facet hypertrophy. No high-grade bony spinal canal stenosis. Upper chest: No consolidation within the imaged lung apices. No visible pneumothorax. Other: Calcified atherosclerotic plaque within the carotid arteries. IMPRESSION: 1. No evidence of acute fracture to the cervical spine. 2. Mild chronic T1 vertebral compression deformity without progressive height loss as compared to the chest CT of 01/05/2015. 3. C7-T1 grade 1 anterolisthesis. 4. Cervical spondylosis as outlined. Electronically Signed   By: Kellie Simmering DO   On: 11/30/2019 14:45   MR BRAIN WO CONTRAST  Result Date: 11/30/2019 CLINICAL DATA:  Initial evaluation for focal neural deficit, history of fall. EXAM: MRI HEAD WITHOUT CONTRAST TECHNIQUE: Multiplanar, multiecho pulse sequences of the brain and surrounding structures were obtained without intravenous contrast. COMPARISON:  Comparison made with prior CT from 11/29/2019 as well as previous MRI from 06/11/2019. FINDINGS: Brain: Generalized age-related cerebral atrophy. Patchy T2/FLAIR hyperintensity within the periventricular deep white matter both cerebral hemispheres as well as the pons, most consistent with chronic small vessel ischemic disease, mild to moderate in nature. There has been interval evolution of previously seen parenchymal hemorrhage involving the left cerebellum and cerebellar vermis, now chronic in appearance. Associated chronic hemosiderin staining seen throughout this region. Scattered foci of cystic encephalomalacia with no definite underlying lesion as previously question on prior MRI. Associated mild ex vacuo dilatation of the fourth ventricle. Small amount of chronic hemosiderin staining also noted at the posterior left temporoparietal region. Previously  identified acute subdural hematoma again seen overlying the right cerebral convexity, most pronounced at the right frontal lobe. This measures up to 13 mm in maximal thickness at the level of the right frontal convexity with mild mass effect on the adjacent right frontal lobe. No significant midline shift. Extension along the falx with a small 4 mm parafalcine component noted as well. No evidence for acute or subacute infarct. Gray-white matter differentiation otherwise maintained. No other areas of chronic cortical infarction. No mass lesion. Ventricles normal size without hydrocephalus. Cavum et septum pellucidum noted. Vascular: Major intracranial vascular flow voids are maintained. Skull and upper cervical spine: Craniocervical junction within normal limits. Bone marrow signal intensity normal. No visible scalp soft tissue abnormality. Sinuses/Orbits: Patient status post bilateral ocular lens replacement. Globes orbital soft tissues demonstrate no acute finding. Paranasal sinuses are largely clear. No significant mastoid effusion. Inner ear structures grossly normal. Other: None. IMPRESSION: 1. Acute subdural hematoma overlying the right cerebral convexity, measuring up to 13 mm in maximal thickness at the level of the right frontal convexity. Mild mass effect on the subjacent right frontal lobe without significant midline shift. 2. No other acute intracranial abnormality. 3. Interval evolution of previously seen parenchymal hemorrhage involving the left cerebellum and cerebellar vermis, now chronic in appearance. Scattered foci of cystic encephalomalacia without definite underlying lesion as previously questioned on prior MRI. 4. Underlying age-related cerebral atrophy with mild-to-moderate chronic small vessel ischemic disease. Electronically Signed   By: Jeannine Boga M.D.   On: 11/30/2019 02:03   US Carotid Bilateral (at [facility] and [facility] only)  Result Date: 11/30/2019 CLINICAL DATA:  79 year old female  with a history of syncope EXAM: BILATERAL CAROTID DUPLEX ULTRASOUND TECHNIQUE: Pearline Cables scale imaging, color Doppler and duplex ultrasound were performed of bilateral carotid and vertebral arteries in the neck. COMPARISON:  None. FINDINGS: Criteria: Quantification of carotid stenosis is based on velocity parameters that correlate the residual internal carotid diameter with NASCET-based stenosis levels, using the diameter of the distal internal carotid lumen as the denominator for stenosis measurement. The following velocity measurements were obtained: RIGHT ICA:  Systolic 63 cm/sec, Diastolic 21 cm/sec CCA:  59 cm/sec SYSTOLIC ICA/CCA RATIO:  1.5 ECA:  48 cm/sec LEFT ICA:  Systolic 75 cm/sec, Diastolic 19 cm/sec CCA:  72 cm/sec SYSTOLIC ICA/CCA RATIO:  1.6 ECA:  60 cm/sec Right Brachial SBP: Not acquired Left Brachial SBP: Not acquired RIGHT CAROTID ARTERY: No significant calcifications of the right common carotid artery. Intermediate waveform maintained. Heterogeneous and partially calcified plaque at the right carotid bifurcation. No significant lumen shadowing. Low resistance waveform of the right ICA. No significant tortuosity. RIGHT VERTEBRAL ARTERY: Antegrade flow with low resistance waveform. LEFT CAROTID ARTERY: No significant calcifications of the left common carotid artery. Intermediate waveform maintained. Heterogeneous and partially calcified plaque at the left carotid bifurcation without significant lumen shadowing. Low resistance waveform of the left ICA. No significant tortuosity. LEFT VERTEBRAL ARTERY:  Antegrade flow with low resistance waveform. IMPRESSION: Color duplex indicates minimal heterogeneous and calcified plaque, with no hemodynamically significant stenosis by duplex criteria in the extracranial cerebrovascular circulation. Signed, Dulcy Fanny. Dellia Nims, RPVI [facility] Electronically Signed   By: Corrie Mckusick D.O.   On: 11/30/2019  07:58   ECHOCARDIOGRAM COMPLETE  Result Date: 11/30/2019    ECHOCARDIOGRAM REPORT   Patient Name:   Tanya Knight Date of Exam: 11/30/2019 Medical Rec #:  [id number]       Height:  61.0 in Accession #:    [id number]      Weight:       113.0 lb Date of Birth:  1940/08/19       BSA:          1.482 m Patient Age:    32 years        BP:           158/86 mmHg Patient Gender: F               HR:           58 bpm. Exam Location:  [facility] Procedure: 2D Echo, Color Doppler and Cardiac Doppler Indications:     Syncope 780.2  History:         Patient has prior history of Echocardiogram examinations, most                  recent 06/10/2019. Stroke; Risk Factors:Hypertension.  Sonographer:     Sherrie Sport RDCS (AE) Referring Phys:  [place] Diagnosing Phys: Serafina Royals MD IMPRESSIONS  1. Left ventricular ejection fraction, by estimation, is 50 to 55%. The left ventricle has low normal function. The left ventricle has no regional wall motion abnormalities. Left ventricular diastolic parameters were normal.  2. Right ventricular systolic function is normal. The right ventricular size is normal. There is normal pulmonary artery systolic pressure.  3. The mitral valve is normal in structure. Mild mitral valve regurgitation.  4. The aortic valve is normal in structure. Aortic valve regurgitation is mild. Mild to moderate aortic valve sclerosis/calcification is present, without any evidence of aortic stenosis. FINDINGS  Left Ventricle: Left ventricular ejection fraction, by estimation, is 50 to 55%. The left ventricle has low normal function. The left ventricle has no regional wall motion abnormalities. The left ventricular internal cavity size was normal in size. There is no left ventricular hypertrophy. Left ventricular diastolic parameters were normal. Right Ventricle: The right ventricular size is normal. No increase in right ventricular wall thickness. Right ventricular systolic function is normal. There is  normal pulmonary artery systolic pressure. The tricuspid regurgitant velocity is 1.76 m/s, and  with an assumed right atrial pressure of 10 mmHg, the estimated right ventricular systolic pressure is 82.9 mmHg. Left Atrium: Left atrial size was normal in size. Right Atrium: Right atrial size was normal in size. Pericardium: There is no evidence of pericardial effusion. Mitral Valve: The mitral valve is normal in structure. Mild mitral valve regurgitation. Tricuspid Valve: The tricuspid valve is normal in structure. Tricuspid valve regurgitation is mild. Aortic Valve: The aortic valve is normal in structure. Aortic valve regurgitation is mild. Mild to moderate aortic valve sclerosis/calcification is present, without any evidence of aortic stenosis. Aortic valve mean gradient measures 5.0 mmHg. Aortic valve peak gradient measures 8.9 mmHg. Aortic valve area, by VTI measures 2.66 cm. Pulmonic Valve: The pulmonic valve was normal in structure. Pulmonic valve regurgitation is trivial. Aorta: The aortic root and ascending aorta are structurally normal, with no evidence of dilitation. IAS/Shunts: No atrial level shunt detected by color flow Doppler.  LEFT VENTRICLE PLAX 2D LVIDd:         3.34 cm  Diastology LVIDs:         2.40 cm  LV e' lateral:   4.24 cm/s LV PW:         1.05 cm  LV E/e' lateral: 15.0 LV IVS:        0.82 cm  LV e' medial:  3.59 cm/s LVOT diam:     2.00 cm  LV E/e' medial:  17.8 LV SV:         86 LV SV Index:   58 LVOT Area:     3.14 cm  RIGHT VENTRICLE RV Basal diam:  3.20 cm RV S prime:     15.20 cm/s TAPSE (M-mode): 2.8 cm LEFT ATRIUM             Index       RIGHT ATRIUM           Index LA diam:        2.30 cm 1.55 cm/m  RA Area:     18.40 cm LA Vol (A2C):   41.5 ml 28.01 ml/m RA Volume:   50.10 ml  33.81 ml/m LA Vol (A4C):   32.8 ml 22.13 ml/m LA Biplane Vol: 37.4 ml 25.24 ml/m  AORTIC VALVE                    PULMONIC VALVE AV Area (Vmax):    2.66 cm     PV Vmax:        0.80 m/s AV Area  (Vmean):   2.57 cm     PV Peak grad:   2.5 mmHg AV Area (VTI):     2.66 cm     RVOT Peak grad: 3 mmHg AV Vmax:           149.00 cm/s AV Vmean:          105.050 cm/s AV VTI:            0.322 m AV Peak Grad:      8.9 mmHg AV Mean Grad:      5.0 mmHg LVOT Vmax:         126.00 cm/s LVOT Vmean:        86.100 cm/s LVOT VTI:          0.273 m LVOT/AV VTI ratio: 0.85  AORTA Ao Root diam: 3.00 cm MITRAL VALVE               TRICUSPID VALVE MV Area (PHT): 3.21 cm    TR Peak grad:   12.4 mmHg MV Decel Time: 236 msec    TR Vmax:        176.00 cm/s MV E velocity: 63.80 cm/s MV A velocity: 95.10 cm/s  SHUNTS MV E/A ratio:  0.67        Systemic VTI:  0.27 m                            Systemic Diam: 2.00 cm Serafina Royals MD Electronically signed by Serafina Royals MD Signature Date/Time: 11/30/2019/5:30:19 PM    Final     Medications:  I have reviewed the patient's current medications. Scheduled: . amLODipine  7.5 mg Oral QPM  . divalproex  500 mg Oral BID  . lidocaine  1 patch Transdermal Q24H    Assessment/Plan:  79 y.o. female with medical history significant of hemorrhagic cerebellar CVA in 05/2019, seizure disorder on depakote,GERD, hypertension, osteoporosis who presented after a syncopal episode that happened around 7 PM on yesterday.  Patient reports having no warning but feeling like the previous episode of seizure that she has had.  Family noticed that she lost consciousness and fell to her right side.  After the syncopal event she did have a hard time speaking lasting between 20 to 30 minutes. In January  patient had sudden onset of headache, nausea, vomiting, slurred speech and weakness resulting in the fall. 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  During that admission CTA head showed 2 mm supraclinoid R ICA aneurysm. Differential for current syncopal event is large but does include possibility of breakthrough seizure activity.  Unclear why this would happen after decades of being seizure free.  Further  work up recommended to rule out other possible causes including CVA, arrhythmia, orthostasis.  MRI of the brain performed and personally reviewed.  No evidence of acute infarct.  Right subdural hematoma noted.  Follow up CT imaging shows no change in SDH.  CTA of the head personally reviewed and shows no change in supraclinoid aneurysm.  Patient not orthostatic.  EEG normal.  Depakote level 52. Concern that event may have been seizure since patient reports that it felt like her past seizure.    Recommendations: 1. Increase Depakote to 750mg  BID 2. Follow up with neurology on an outpatient basis.   3. Patient unable to drive, operate heavy machinery, perform activities at heights and participate in water activities until release by outpatient physician.   LOS: 0 days   Alexis Goodell, MD Neurology [phone] 12/01/2019  2:06 PM

## 2019-12-01 NOTE — Care Management Obs Status (Signed)
[place] NOTIFICATION   Patient Details  Name: Tanya Knight MRN: [id number] Date of Birth: 1940-10-04   Medicare Observation Status Notification Given:  Yes    Shelbie Ammons, RN 12/01/2019, 10:33 AM

## 2019-12-01 NOTE — Evaluation (Signed)
Physical Therapy Evaluation Patient Details Name: Tanya Knight MRN: [id number] DOB: 1940-07-23 Today's Date: 12/01/2019   History of Present Illness  Pt is a 79 y.o. female presenting to hospital 7/20 s/p fall (pt with LOC and noted with aphasia initially); c/o R lateral rib pain; found down by family.  Imaging showing R frontal SDH--follow up CT imaging shows no change in SDH.  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  Pt admitted with SDH, syncope and collapse, seizure disorder, word finding difficulty, rib fx's, and multiple thoracic fx's (appear chronic).  PMH includes significant hemorrhagic cerebellar CVA 05/2019 (Cerebellar vermis ICH with SDH and SAH (likely hypertensive)), htn, seizure disorder on Depakote, stroke, dysphagia post stroke, vascular HA, h/o foot surgery.  Clinical Impression  Prior to hospital admission, pt was ambulatory with walker; receiving HHPT/OT; and lives with her husband in 1 level home with 3 STE with B railings.  Currently pt is SBA with transfers and CGA ambulating up to 120 feet with RW.  Overall pt steady and safe ambulating with RW but limited distance d/t R rib pain causing mild SOB.  BP 147/78 prior to ambulation and 144/88 post ambulation.  Dull HA noted but did not change during sessions activities (nurse notified).  Pt would benefit from skilled PT to address noted impairments and functional limitations (see below for any additional details).  Upon hospital discharge, pt would benefit from continued HHPT.    Follow Up Recommendations Home health PT;Supervision for mobility/OOB    Equipment Recommendations  Rolling walker with 5" wheels (pt  has RW at home already)    Recommendations for Other Services OT consult     Precautions / Restrictions Precautions Precautions: Fall Precaution Comments: Per neurosurgery maintain BP < 035 systolic Restrictions Weight Bearing Restrictions: No      Mobility  Bed Mobility         General bed mobility comments: Deferred (pt supervision supine to sit with OT)  Transfers Overall transfer level: Needs assistance Equipment used: Rolling walker (2 wheeled) Transfers: Sit to/from Stand Sit to Stand: Supervision         General transfer comment: fairly strong stand from recliner x1 trial and from toilet x1 trial; steady  Ambulation/Gait Ambulation/Gait assistance: Min guard Gait Distance (Feet):  (25 feet to bathroom; 120 feet) Assistive device: Rolling walker (2 wheeled)   Gait velocity: decreased (pt reporting d/t being cautious from rib pain)   General Gait Details: partial step through gait pattern; steady with RW  Stairs Stairs:  (pt declined (pt reporting no concerns and wanting to rest prior to discharge home; pt reports her son and husband would be able to assist as needed))          Wheelchair Mobility    Modified Rankin (Stroke Patients Only)       Balance Overall balance assessment: Needs assistance Sitting-balance support: No upper extremity supported;Feet supported Sitting balance-Leahy Scale: Normal Sitting balance - Comments: steady sitting reaching outside BOS   Standing balance support: No upper extremity supported Standing balance-Leahy Scale: Good Standing balance comment: steady standing washing hands at sink  Pertinent Vitals/Pain Pain Assessment: 0-10 Pain Score: 6  Faces Pain Scale: Hurts even more Pain Location: R rib pain 6/10; dull HA (nurse notified) Pain Descriptors / Indicators: Constant;Sore;Grimacing;Guarding (regarding rib pain) Pain Intervention(s): Limited activity within patient's  tolerance;Monitored during session;Repositioned;Premedicated before session    [place] expects to be discharged to:: Private residence Living Arrangements: Spouse/significant other Available Help at Discharge: Family;Available 24 hours/day Type of Home: House Home Access: Stairs to enter Entrance Stairs-Rails: Right;Left;Can reach both Entrance Stairs-Number of Steps: 3 Home Layout: One level Home Equipment: Walker - 2 wheels;Cane - single point;Transport chair;Shower seat;Bedside commode      Prior Function Level of Independence: Independent with assistive device(s)         Comments: Pt was at CIR after stroke in January but now has been receiving HHPT and HHOT since then.  Ambulates up to "20 minutes" with PT using RW.  Pt enjoys reading (has been listening to books on tapes d/t visual issues s/p stroke).  (-) driving.  Family assists with IADL management.     Hand Dominance   Dominant Hand: Right    Extremity/Trunk Assessment   Upper Extremity Assessment Upper Extremity Assessment: Defer to OT evaluation    Lower Extremity Assessment Lower Extremity Assessment: Generalized weakness    Cervical / Trunk Assessment Cervical / Trunk Assessment: Other exceptions (forward head/shoulders)  Communication   Communication: No difficulties  Cognition Arousal/Alertness: Awake/alert Behavior During Therapy: WFL for tasks assessed/performed Overall Cognitive Status: Within Functional Limits for tasks assessed                                 General Comments: Pleasant and conversational      General Comments General comments (skin integrity, edema, etc.): Pt denies any dizziness or lightheadedness during session.  Nursing cleared pt for participation in physical therapy.  Pt agreeable to PT session.  Per secure message with MD [place] no precautions/contraindications concerning L1 vertebral body fx.    Exercises    Assessment/Plan    PT  Assessment Patient needs continued PT services  PT Problem List Decreased strength;Decreased activity tolerance;Decreased balance;Decreased mobility;Decreased knowledge of use of DME;Decreased knowledge of precautions;Pain       PT Treatment Interventions DME instruction;Gait training;Stair training;Functional mobility training;Therapeutic activities;Therapeutic exercise;Balance training;Patient/family education    PT Goals (Current goals can be found in the Care Plan section)  Acute Rehab PT Goals Patient Stated Goal: to go home PT Goal Formulation: With patient Time For Goal Achievement: 12/15/19 Potential to Achieve Goals: Good    Frequency 7X/week   Barriers to discharge        Co-evaluation               AM-PAC PT "6 Clicks" Mobility  Outcome Measure Help needed turning from your back to your side while in a flat bed without using bedrails?: None Help needed moving from lying on your back to sitting on the side of a flat bed without using bedrails?: A Little Help needed moving to and from a bed to a chair (including a wheelchair)?: A Little Help needed standing up from a chair using your arms (e.g., wheelchair or bedside chair)?: A Little Help needed to walk in hospital room?: A Little Help needed climbing 3-5 steps with a railing? : A Little 6 Click Score: 19    End of Session Equipment Utilized During Treatment: Gait belt Activity Tolerance: Patient tolerated treatment well  Patient left: in chair;with call bell/phone within reach;with chair alarm set Nurse Communication: Mobility status;Precautions;Other (comment) (pt's vitals) PT Visit Diagnosis: Other abnormalities of gait and mobility (R26.89);Muscle weakness (generalized) (M62.81);Difficulty in walking, not elsewhere classified (R26.2)    Time: 7793-9688 PT Time Calculation (min) (ACUTE ONLY): 30 min   Charges:   PT Evaluation $PT Eval Low Complexity: 1 Low PT Treatments $Therapeutic Activity: 8-22  mins       Leitha Bleak, PT 12/01/19, 4:37 PM

## 2019-12-01 NOTE — Telephone Encounter (Signed)
PT is being referred back to our office for a 2 week hospital f/u for subdural hematoma. The referral says for her to follow up with [facility]. I spoke with [place] and she would prefer for patient to follow up with you because they want to eval for possible sz vs snycope.   Can you please review and advise when you would like patient scheduled?  Thank you

## 2019-12-01 NOTE — Telephone Encounter (Signed)
Ok with me 

## 2019-12-05 ENCOUNTER — Other Ambulatory Visit: Payer: Self-pay | Admitting: Nurse Practitioner

## 2019-12-05 DIAGNOSIS — S065XAA Traumatic subdural hemorrhage with loss of consciousness status unknown, initial encounter: Secondary | ICD-10-CM

## 2019-12-05 NOTE — Telephone Encounter (Signed)
When would you like patient scheduled? Does she need to be in a New patient spot? Your next new patient spot is September or can she go in a office visit spot?  Thank you

## 2019-12-05 NOTE — Telephone Encounter (Signed)
If new consult will need 30 min slot when available in 2-4 weeks

## 2019-12-06 ENCOUNTER — Other Ambulatory Visit: Payer: Self-pay

## 2019-12-06 ENCOUNTER — Encounter: Payer: Self-pay | Admitting: Internal Medicine

## 2019-12-06 ENCOUNTER — Ambulatory Visit: Payer: Medicare PPO | Admitting: Internal Medicine

## 2019-12-06 DIAGNOSIS — H8190 Unspecified disorder of vestibular function, unspecified ear: Secondary | ICD-10-CM | POA: Diagnosis not present

## 2019-12-06 DIAGNOSIS — E441 Mild protein-calorie malnutrition: Secondary | ICD-10-CM

## 2019-12-06 DIAGNOSIS — R55 Syncope and collapse: Secondary | ICD-10-CM | POA: Diagnosis not present

## 2019-12-06 DIAGNOSIS — K219 Gastro-esophageal reflux disease without esophagitis: Secondary | ICD-10-CM

## 2019-12-06 DIAGNOSIS — G40909 Epilepsy, unspecified, not intractable, without status epilepticus: Secondary | ICD-10-CM | POA: Diagnosis not present

## 2019-12-06 DIAGNOSIS — R1310 Dysphagia, unspecified: Secondary | ICD-10-CM | POA: Diagnosis not present

## 2019-12-06 DIAGNOSIS — I10 Essential (primary) hypertension: Secondary | ICD-10-CM | POA: Diagnosis not present

## 2019-12-06 DIAGNOSIS — S2241XD Multiple fractures of ribs, right side, subsequent encounter for fracture with routine healing: Secondary | ICD-10-CM

## 2019-12-06 DIAGNOSIS — I69293 Ataxia following other nontraumatic intracranial hemorrhage: Secondary | ICD-10-CM | POA: Diagnosis not present

## 2019-12-06 DIAGNOSIS — I69391 Dysphagia following cerebral infarction: Secondary | ICD-10-CM | POA: Diagnosis not present

## 2019-12-06 DIAGNOSIS — M810 Age-related osteoporosis without current pathological fracture: Secondary | ICD-10-CM | POA: Diagnosis not present

## 2019-12-06 DIAGNOSIS — I69298 Other sequelae of other nontraumatic intracranial hemorrhage: Secondary | ICD-10-CM | POA: Diagnosis not present

## 2019-12-06 MED ORDER — OMEPRAZOLE 20 MG PO CPDR: 20.0000 mg | DELAYED_RELEASE_CAPSULE | Freq: Every day | ORAL | 3 refills | Status: DC

## 2019-12-06 NOTE — Assessment & Plan Note (Signed)
depakote was increased--this is a good idea with low therapeutic level and possible seizure

## 2019-12-06 NOTE — Assessment & Plan Note (Signed)
Urged her to try calorie rich foods and ensure/boost

## 2019-12-06 NOTE — Progress Notes (Signed)
Subjective:    Patient ID: Tanya Knight, female    DOB: 04-13-1941, 79 y.o.   MRN: [id number]  HPI Here with DIL Jeani Hawking for hospital follow up This visit occurred during the SARS-CoV-2 public health emergency.  Safety protocols were in place, including screening questions prior to the visit, additional usage of staff PPE, and extensive cleaning of exam room while observing appropriate contact time as indicated for disinfecting solutions.   Had another syncopal spell No warning No clear seizure--and no clear aura (other than some mistakes on questions) Noted right side pain--so to ER Reviewed hospital records  Echo showed no cause for syncope Small SDH---observation only No other new MRI findings of brain depakote was increased Right rib fractures--taking tylenol (will start lidocaine patch)  Walking around with walker Limited in reaching due to the pain Can shower--being careful Dressing and bathroom herself  Husband helping her with instrumental ADLs  Current Outpatient Medications on File Prior to Visit  Medication Sig Dispense Refill  . amLODipine (NORVASC) 2.5 MG tablet Take 3 tablets (7.5 mg total) by mouth daily. (Patient taking differently: Take 7.5 mg by mouth every evening. ) 90 tablet 3  . [START ON 12/09/2019] aspirin 81 MG EC tablet Take 1 tablet (81 mg total) by mouth daily. Swallow whole. 30 tablet 12  . divalproex (DEPAKOTE) 250 MG DR tablet Take 3 tablets (750 mg total) by mouth 2 (two) times daily. 540 tablet 0  . metoprolol succinate (TOPROL-XL) 50 MG 24 hr tablet Take 1 tablet (50 mg total) by mouth daily. Take with or immediately following a meal. (Patient taking differently: Take 50 mg by mouth every evening. Take with or immediately following a meal.) 90 tablet 3  . Multiple Vitamin (MULTIVITAMIN) capsule Take 1 capsule by mouth daily.       No current facility-administered medications on file prior to visit.    Allergies  Allergen Reactions  .  Phenytoin     Other reaction(s): Other (See Comments) Other Reaction: Other reaction REACTION: severe reaction  . Septra [Sulfamethoxazole-Trimethoprim]     Nausea, trouble eating and drinking    Past Medical History:  Diagnosis Date  . Fracture of metatarsal    Repair of fractured R metatarsal --Dr Duda---4/08  . GERD (gastroesophageal reflux disease)   . Hypertension   . Melanoma in situ [facility]) 7/17   Dr Kellie Moor  . Osteoporosis   . Seizure disorder ([place])   . Seizures ([place])   . Stroke ([place]) 06/2019  . Vitamin D deficiency     Past Surgical History:  Procedure Laterality Date  . CATARACT EXTRACTION, BILATERAL Bilateral 2014  . EYE SURGERY     Obstructed tear duct  . FOOT SURGERY  2008  . MELANOMA EXCISION Left 11/2015   left lower leg  . TEAR DUCT PROBING  10/12   temporary stent  . TONSILLECTOMY AND ADENOIDECTOMY  1948    Family History  Problem Relation Age of Onset  . Hypertension Mother   . Heart disease Father   . Breast cancer Daughter 35  . Diabetes Maternal Aunt   . Coronary artery disease Paternal Aunt   . Breast cancer Paternal Aunt   . Coronary artery disease Paternal Uncle   . Heart disease Maternal Grandmother   . Heart disease Maternal Grandfather   . Heart disease Paternal Grandmother   . Heart disease Paternal Grandfather   . Colon cancer Neg Hx   . Stomach cancer Neg Hx   . Pancreatic cancer  Neg Hx   . Rectal cancer Neg Hx     Social History   Socioeconomic History  . Marital status: Married    Spouse name: Not on file  . Number of children: 3  . Years of education: 12  . Highest education level: Not on file  Occupational History  . Occupation: retired Financial planner: retired  Tobacco Use  . Smoking status: Never Smoker  . Smokeless tobacco: Never Used  Substance and Sexual Activity  . Alcohol use: Yes    Alcohol/week: 0.0 standard drinks    Comment: occasional  . Drug use: No  . Sexual activity: Yes    Birth  control/protection: Post-menopausal  Other Topics Concern  . Not on file  Social History Narrative   09/14/19 lives at home with husband   Regular exercise-yes---aerobics, Pilates, jogged in past (now walks)      Has living will   Husband, then one of her children, has health care POA   Would accept resuscitation but no prolonged artificial ventilation   Probably would not want tube feeds if cognitively unaware   Social Determinants of Health   Financial Resource Strain:   . Difficulty of Paying Living Expenses:   Food Insecurity:   . Worried About Charity fundraiser in the Last Year:   . Arboriculturist in the Last Year:   Transportation Needs:   . Film/video editor (Medical):   Marland Kitchen Lack of Transportation (Non-Medical):   Physical Activity:   . Days of Exercise per Week:   . Minutes of Exercise per Session:   Stress:   . Feeling of Stress :   Social Connections:   . Frequency of Communication with Friends and Family:   . Frequency of Social Gatherings with Friends and Family:   . Attends Religious Services:   . Active Member of Clubs or Organizations:   . Attends Archivist Meetings:   Marland Kitchen Marital Status:   Intimate Partner Violence:   . Fear of Current or Ex-Partner:   . Emotionally Abused:   Marland Kitchen Physically Abused:   . Sexually Abused:    Review of Systems Sleeps okay--and naps in recliner Not eating well still---having a lot of acid and burping Some nausea and dry heaves this morning Did take prilosec for 2 weeks--and this helps    Objective:   Physical Exam Constitutional:      Comments: Mild wasting  Cardiovascular:     Rate and Rhythm: Normal rate and regular rhythm.     Heart sounds: Normal heart sounds.  Pulmonary:     Effort: Pulmonary effort is normal. No respiratory distress.     Breath sounds: Normal breath sounds.     Comments: No sig chest tenderness with light provocation Abdominal:     Palpations: Abdomen is soft.     Tenderness:  There is no abdominal tenderness.  Musculoskeletal:     Cervical back: Neck supple.     Right lower leg: No edema.     Left lower leg: No edema.  Lymphadenopathy:     Cervical: No cervical adenopathy.  Neurological:     General: No focal deficit present.     Mental Status: She is alert.  Psychiatric:        Mood and Affect: Mood normal.        Behavior: Behavior normal.            Assessment & Plan:

## 2019-12-06 NOTE — Telephone Encounter (Signed)
Called patient to schedule appointment she said that she is following with a different Neurologist in [place] the appointment with our office is no longer needed.

## 2019-12-06 NOTE — Assessment & Plan Note (Signed)
Discussed tylenol 1000mg  tid and lidocaine patch--and wean as the pain improves

## 2019-12-06 NOTE — Assessment & Plan Note (Signed)
Symptoms have recurred Urged her to restart the omeprazole and stay on it pending our follow up

## 2019-12-06 NOTE — Assessment & Plan Note (Signed)
Puzzling repeat episode Doesn't seem to be cardiac ?seizure Will go back to neurologist

## 2019-12-07 DIAGNOSIS — I69298 Other sequelae of other nontraumatic intracranial hemorrhage: Secondary | ICD-10-CM | POA: Diagnosis not present

## 2019-12-07 DIAGNOSIS — G40909 Epilepsy, unspecified, not intractable, without status epilepticus: Secondary | ICD-10-CM | POA: Diagnosis not present

## 2019-12-07 DIAGNOSIS — M810 Age-related osteoporosis without current pathological fracture: Secondary | ICD-10-CM | POA: Diagnosis not present

## 2019-12-07 DIAGNOSIS — H8190 Unspecified disorder of vestibular function, unspecified ear: Secondary | ICD-10-CM | POA: Diagnosis not present

## 2019-12-07 DIAGNOSIS — H532 Diplopia: Secondary | ICD-10-CM

## 2019-12-07 DIAGNOSIS — I69293 Ataxia following other nontraumatic intracranial hemorrhage: Secondary | ICD-10-CM | POA: Diagnosis not present

## 2019-12-07 DIAGNOSIS — E876 Hypokalemia: Secondary | ICD-10-CM

## 2019-12-07 DIAGNOSIS — E559 Vitamin D deficiency, unspecified: Secondary | ICD-10-CM

## 2019-12-07 DIAGNOSIS — I69391 Dysphagia following cerebral infarction: Secondary | ICD-10-CM | POA: Diagnosis not present

## 2019-12-07 DIAGNOSIS — I10 Essential (primary) hypertension: Secondary | ICD-10-CM | POA: Diagnosis not present

## 2019-12-07 DIAGNOSIS — R1310 Dysphagia, unspecified: Secondary | ICD-10-CM | POA: Diagnosis not present

## 2019-12-07 DIAGNOSIS — Z9181 History of falling: Secondary | ICD-10-CM

## 2019-12-07 DIAGNOSIS — E785 Hyperlipidemia, unspecified: Secondary | ICD-10-CM

## 2019-12-07 DIAGNOSIS — K219 Gastro-esophageal reflux disease without esophagitis: Secondary | ICD-10-CM | POA: Diagnosis not present

## 2019-12-07 DIAGNOSIS — Z85820 Personal history of malignant melanoma of skin: Secondary | ICD-10-CM

## 2019-12-11 DIAGNOSIS — H8190 Unspecified disorder of vestibular function, unspecified ear: Secondary | ICD-10-CM | POA: Diagnosis not present

## 2019-12-11 DIAGNOSIS — G40909 Epilepsy, unspecified, not intractable, without status epilepticus: Secondary | ICD-10-CM | POA: Diagnosis not present

## 2019-12-11 DIAGNOSIS — I69293 Ataxia following other nontraumatic intracranial hemorrhage: Secondary | ICD-10-CM | POA: Diagnosis not present

## 2019-12-11 DIAGNOSIS — I69298 Other sequelae of other nontraumatic intracranial hemorrhage: Secondary | ICD-10-CM | POA: Diagnosis not present

## 2019-12-11 DIAGNOSIS — I69391 Dysphagia following cerebral infarction: Secondary | ICD-10-CM | POA: Diagnosis not present

## 2019-12-11 DIAGNOSIS — R1310 Dysphagia, unspecified: Secondary | ICD-10-CM | POA: Diagnosis not present

## 2019-12-11 DIAGNOSIS — M810 Age-related osteoporosis without current pathological fracture: Secondary | ICD-10-CM | POA: Diagnosis not present

## 2019-12-11 DIAGNOSIS — I10 Essential (primary) hypertension: Secondary | ICD-10-CM | POA: Diagnosis not present

## 2019-12-11 DIAGNOSIS — K219 Gastro-esophageal reflux disease without esophagitis: Secondary | ICD-10-CM | POA: Diagnosis not present

## 2019-12-12 DIAGNOSIS — I69391 Dysphagia following cerebral infarction: Secondary | ICD-10-CM | POA: Diagnosis not present

## 2019-12-12 DIAGNOSIS — I69293 Ataxia following other nontraumatic intracranial hemorrhage: Secondary | ICD-10-CM | POA: Diagnosis not present

## 2019-12-12 DIAGNOSIS — I10 Essential (primary) hypertension: Secondary | ICD-10-CM | POA: Diagnosis not present

## 2019-12-12 DIAGNOSIS — G40909 Epilepsy, unspecified, not intractable, without status epilepticus: Secondary | ICD-10-CM | POA: Diagnosis not present

## 2019-12-12 DIAGNOSIS — I69298 Other sequelae of other nontraumatic intracranial hemorrhage: Secondary | ICD-10-CM | POA: Diagnosis not present

## 2019-12-12 DIAGNOSIS — M810 Age-related osteoporosis without current pathological fracture: Secondary | ICD-10-CM | POA: Diagnosis not present

## 2019-12-12 DIAGNOSIS — K219 Gastro-esophageal reflux disease without esophagitis: Secondary | ICD-10-CM | POA: Diagnosis not present

## 2019-12-12 DIAGNOSIS — H8190 Unspecified disorder of vestibular function, unspecified ear: Secondary | ICD-10-CM | POA: Diagnosis not present

## 2019-12-12 DIAGNOSIS — R1310 Dysphagia, unspecified: Secondary | ICD-10-CM | POA: Diagnosis not present

## 2019-12-13 DIAGNOSIS — G40909 Epilepsy, unspecified, not intractable, without status epilepticus: Secondary | ICD-10-CM | POA: Diagnosis not present

## 2019-12-13 DIAGNOSIS — I69293 Ataxia following other nontraumatic intracranial hemorrhage: Secondary | ICD-10-CM | POA: Diagnosis not present

## 2019-12-13 DIAGNOSIS — I619 Nontraumatic intracerebral hemorrhage, unspecified: Secondary | ICD-10-CM | POA: Diagnosis not present

## 2019-12-13 DIAGNOSIS — I10 Essential (primary) hypertension: Secondary | ICD-10-CM | POA: Diagnosis not present

## 2019-12-19 DIAGNOSIS — R1310 Dysphagia, unspecified: Secondary | ICD-10-CM | POA: Diagnosis not present

## 2019-12-19 DIAGNOSIS — M810 Age-related osteoporosis without current pathological fracture: Secondary | ICD-10-CM | POA: Diagnosis not present

## 2019-12-19 DIAGNOSIS — I10 Essential (primary) hypertension: Secondary | ICD-10-CM | POA: Diagnosis not present

## 2019-12-19 DIAGNOSIS — H8190 Unspecified disorder of vestibular function, unspecified ear: Secondary | ICD-10-CM | POA: Diagnosis not present

## 2019-12-19 DIAGNOSIS — I69293 Ataxia following other nontraumatic intracranial hemorrhage: Secondary | ICD-10-CM | POA: Diagnosis not present

## 2019-12-19 DIAGNOSIS — I69298 Other sequelae of other nontraumatic intracranial hemorrhage: Secondary | ICD-10-CM | POA: Diagnosis not present

## 2019-12-19 DIAGNOSIS — K219 Gastro-esophageal reflux disease without esophagitis: Secondary | ICD-10-CM | POA: Diagnosis not present

## 2019-12-19 DIAGNOSIS — G40909 Epilepsy, unspecified, not intractable, without status epilepticus: Secondary | ICD-10-CM | POA: Diagnosis not present

## 2019-12-19 DIAGNOSIS — I69391 Dysphagia following cerebral infarction: Secondary | ICD-10-CM | POA: Diagnosis not present

## 2019-12-23 DIAGNOSIS — I69391 Dysphagia following cerebral infarction: Secondary | ICD-10-CM | POA: Diagnosis not present

## 2019-12-23 DIAGNOSIS — I69298 Other sequelae of other nontraumatic intracranial hemorrhage: Secondary | ICD-10-CM | POA: Diagnosis not present

## 2019-12-23 DIAGNOSIS — I10 Essential (primary) hypertension: Secondary | ICD-10-CM | POA: Diagnosis not present

## 2019-12-23 DIAGNOSIS — R1310 Dysphagia, unspecified: Secondary | ICD-10-CM | POA: Diagnosis not present

## 2019-12-23 DIAGNOSIS — G40909 Epilepsy, unspecified, not intractable, without status epilepticus: Secondary | ICD-10-CM | POA: Diagnosis not present

## 2019-12-23 DIAGNOSIS — K219 Gastro-esophageal reflux disease without esophagitis: Secondary | ICD-10-CM | POA: Diagnosis not present

## 2019-12-23 DIAGNOSIS — H8190 Unspecified disorder of vestibular function, unspecified ear: Secondary | ICD-10-CM | POA: Diagnosis not present

## 2019-12-23 DIAGNOSIS — M810 Age-related osteoporosis without current pathological fracture: Secondary | ICD-10-CM | POA: Diagnosis not present

## 2019-12-23 DIAGNOSIS — I69293 Ataxia following other nontraumatic intracranial hemorrhage: Secondary | ICD-10-CM | POA: Diagnosis not present

## 2019-12-26 DIAGNOSIS — K219 Gastro-esophageal reflux disease without esophagitis: Secondary | ICD-10-CM | POA: Diagnosis not present

## 2019-12-26 DIAGNOSIS — M810 Age-related osteoporosis without current pathological fracture: Secondary | ICD-10-CM | POA: Diagnosis not present

## 2019-12-26 DIAGNOSIS — I69391 Dysphagia following cerebral infarction: Secondary | ICD-10-CM | POA: Diagnosis not present

## 2019-12-26 DIAGNOSIS — G40909 Epilepsy, unspecified, not intractable, without status epilepticus: Secondary | ICD-10-CM | POA: Diagnosis not present

## 2019-12-26 DIAGNOSIS — I69293 Ataxia following other nontraumatic intracranial hemorrhage: Secondary | ICD-10-CM | POA: Diagnosis not present

## 2019-12-26 DIAGNOSIS — I10 Essential (primary) hypertension: Secondary | ICD-10-CM | POA: Diagnosis not present

## 2019-12-26 DIAGNOSIS — H8190 Unspecified disorder of vestibular function, unspecified ear: Secondary | ICD-10-CM | POA: Diagnosis not present

## 2019-12-26 DIAGNOSIS — I69298 Other sequelae of other nontraumatic intracranial hemorrhage: Secondary | ICD-10-CM | POA: Diagnosis not present

## 2019-12-26 DIAGNOSIS — R1310 Dysphagia, unspecified: Secondary | ICD-10-CM | POA: Diagnosis not present

## 2019-12-27 ENCOUNTER — Other Ambulatory Visit: Payer: Self-pay

## 2019-12-27 ENCOUNTER — Ambulatory Visit
Admission: RE | Admit: 2019-12-27 | Discharge: 2019-12-27 | Disposition: A | Discharge status: Home / Self Care | Payer: Medicare PPO | Source: Ambulatory Visit | Attending: Nurse Practitioner | Admitting: Nurse Practitioner

## 2019-12-27 DIAGNOSIS — I6201 Nontraumatic acute subdural hemorrhage: Secondary | ICD-10-CM | POA: Diagnosis not present

## 2019-12-27 DIAGNOSIS — S065XAA Traumatic subdural hemorrhage with loss of consciousness status unknown, initial encounter: Secondary | ICD-10-CM

## 2019-12-27 DIAGNOSIS — S065X9A Traumatic subdural hemorrhage with loss of consciousness of unspecified duration, initial encounter: Secondary | ICD-10-CM | POA: Insufficient documentation

## 2019-12-27 DIAGNOSIS — G9389 Other specified disorders of brain: Secondary | ICD-10-CM | POA: Diagnosis not present

## 2019-12-27 DIAGNOSIS — S065X0A Traumatic subdural hemorrhage without loss of consciousness, initial encounter: Secondary | ICD-10-CM | POA: Diagnosis not present

## 2019-12-27 DIAGNOSIS — I6203 Nontraumatic chronic subdural hemorrhage: Secondary | ICD-10-CM | POA: Diagnosis not present

## 2019-12-27 IMAGING — CT CT HEAD W/O CM
4 series · 14 of 47 positions shown, 16 images · non-contrast
Comparison: [DATE]

CLINICAL DATA: Subdural hematoma, follow-up

EXAM:
CT HEAD WITHOUT CONTRAST
TECHNIQUE: Contiguous axial images were obtained from the base of the skull
through the vertex without intravenous contrast.

[Series 2: axial st head 5.00 ax · axial · 0.33mm/px · z∈[-560,-460]mm · 5 of 30 slices shown, 7 images]
[im 5/30  brain]
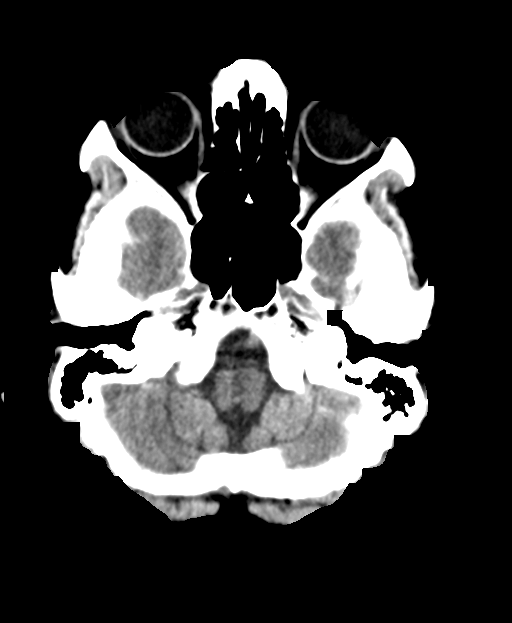
[im 5/30  bone]
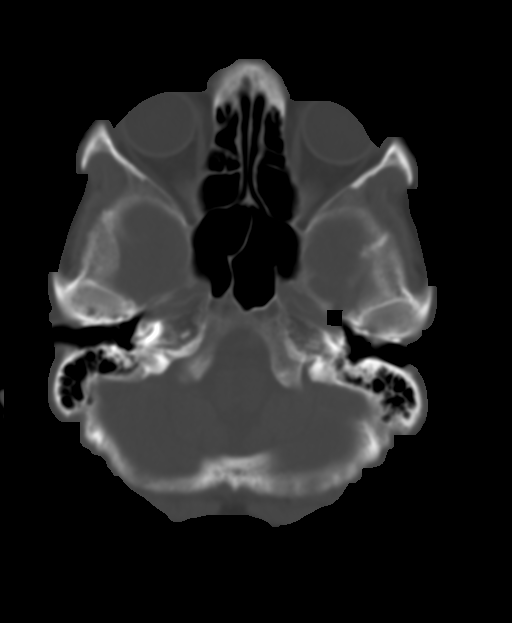
[im 10/30  brain]
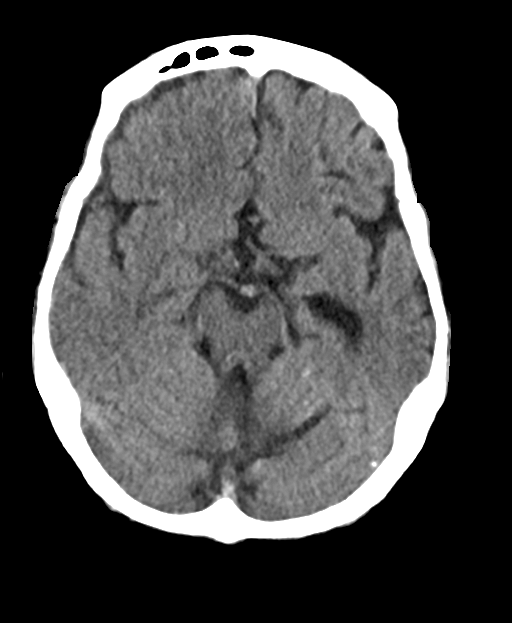
[im 15/30  brain]
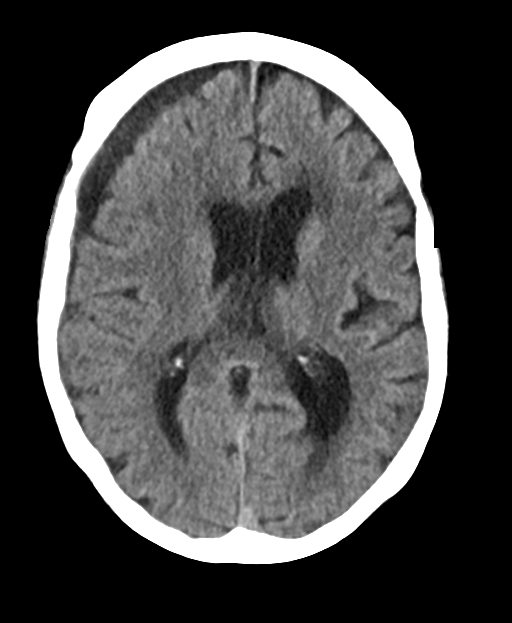
[im 20/30  brain]
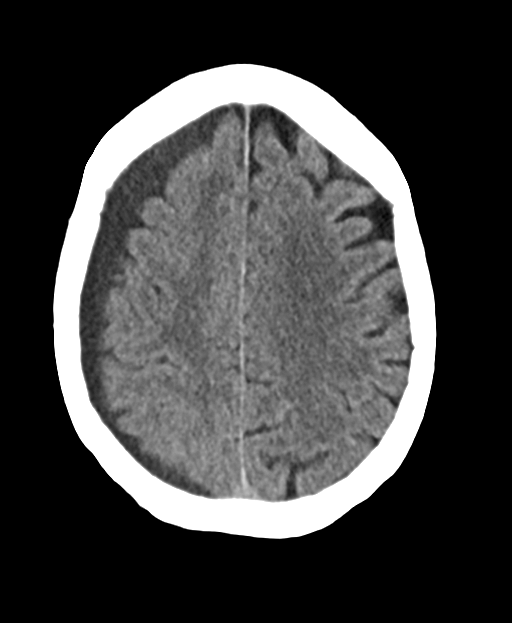
[im 25/30  brain]
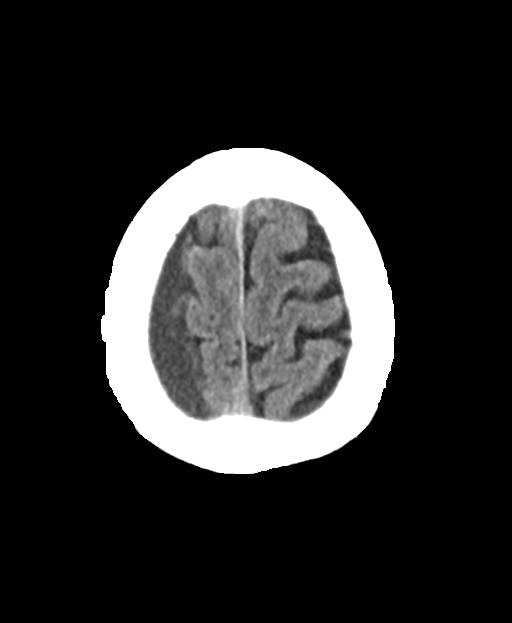
[im 25/30  bone]
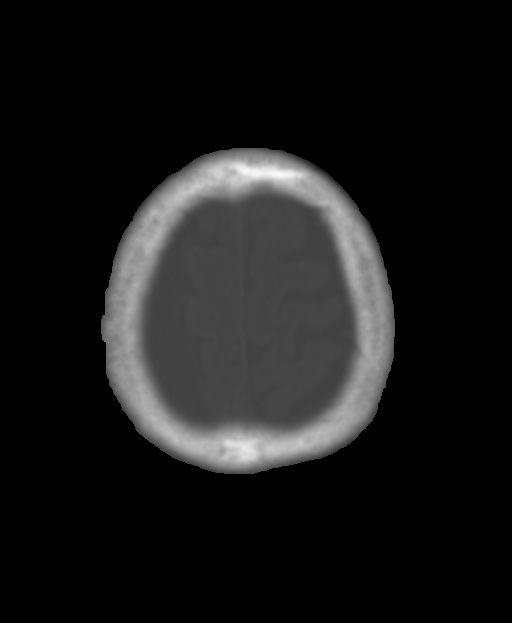

[Series 4: axial bone head 1.50 ax · axial · 0.33mm/px · z∈[-568,-534]mm · 3 of 102 slices shown]
[im 10/102  bone]
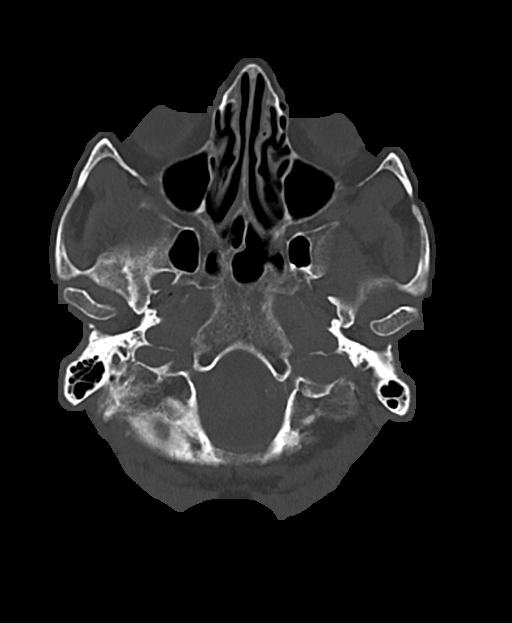
[im 19/102  bone]
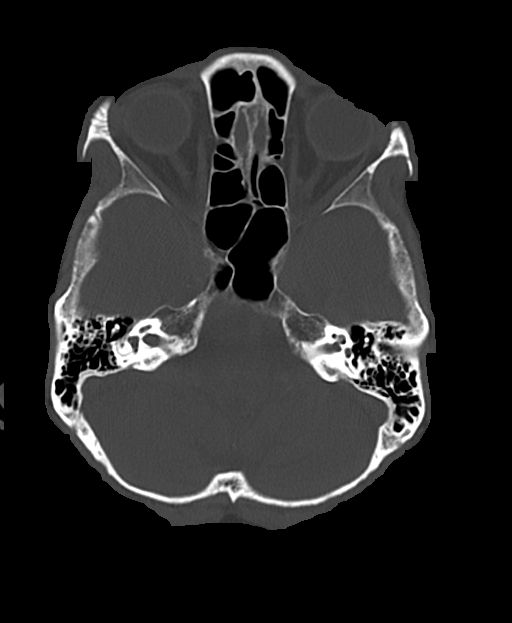
[im 33/102  bone]
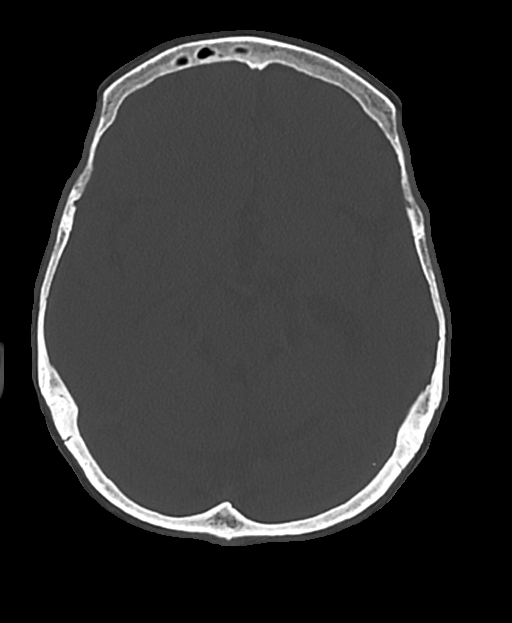

[Series 6: coronals head 3.00 cor · coronal · 0.31mm/px · 3 of 67 slices shown]
[im 23/67  brain]
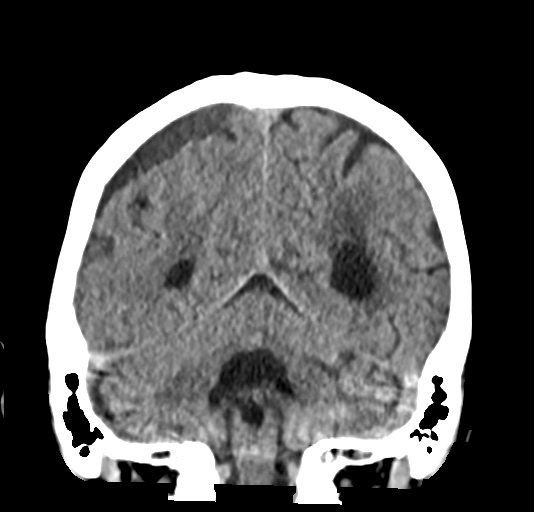
[im 30/67  brain]
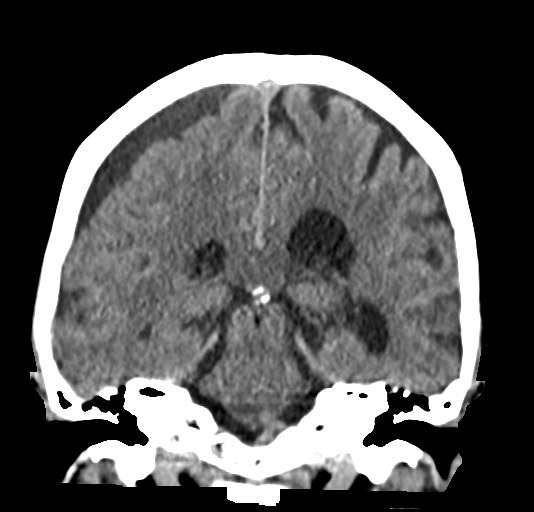
[im 37/67  brain]
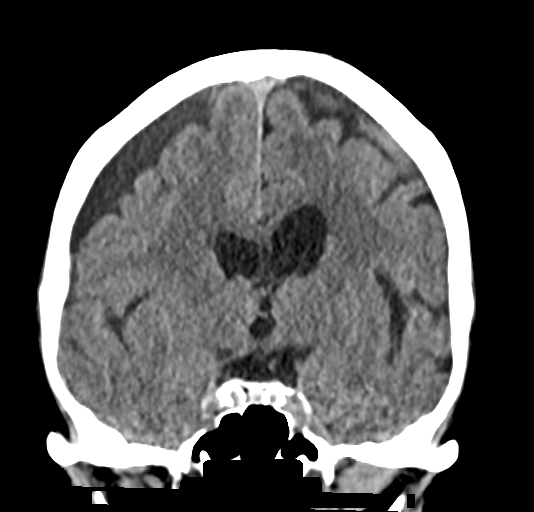

[Series 8: sagittals head 3.00 sag · sagittal · 0.31mm/px · 3 of 55 slices shown]
[im 19/55  brain]
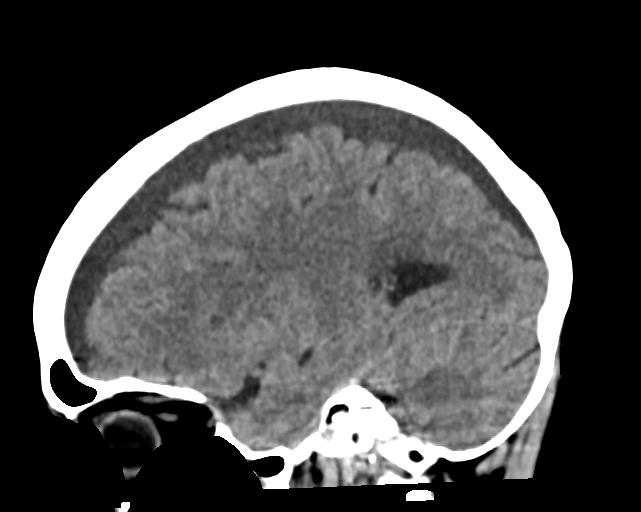
[im 28/55  brain]
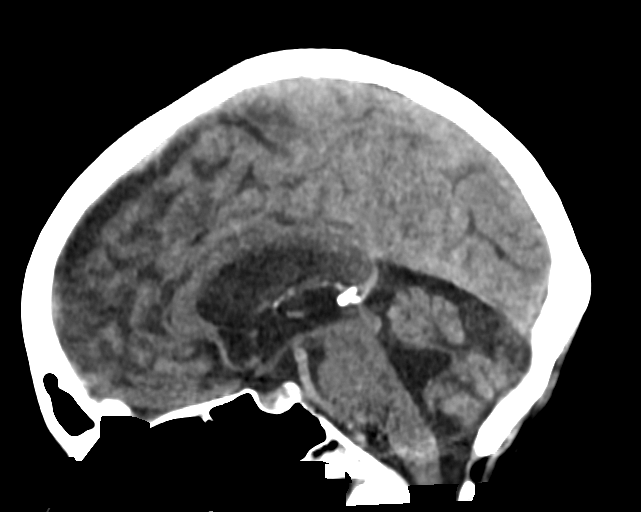
[im 37/55  brain]
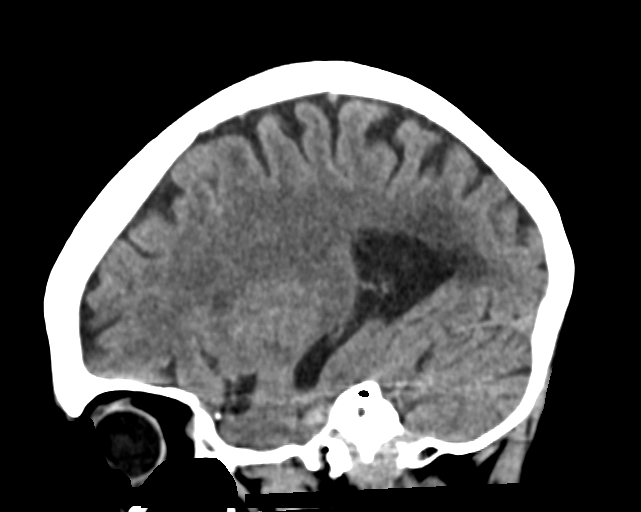

[14 of 47 positions shown; findings below may reference images not displayed]

FINDINGS: Brain: Right subdural hyperdense hemorrhage has expectedly resolved.
There is a persistent hypoattenuating right cerebral convexity
subdural collection, which is increased compared to the prior study.
This measures up to 1.1 cm in maximal thickness. There is minor mass
effect on the underlying parenchyma with similar trace leftward
midline shift.

No new loss of gray-white differentiation. Patchy hypoattenuation in
the supratentorial white matter is nonspecific but likely reflects
stable chronic microvascular ischemic changes. Prominence of the
ventricles and sulci reflects stable parenchymal volume loss. Left
cerebellar encephalomalacia extending to midline again seen.

Vascular: There is atherosclerotic calcification at the skull base.

Skull: Calvarium is unremarkable.

Sinuses/Orbits: No acute finding.

Other: None.
IMPRESSION: Persistent chronic right cerebral convexity subdural hematoma with
minor mass effect. No acute hemorrhage.

Stable chronic findings detailed above.

## 2019-12-28 ENCOUNTER — Ambulatory Visit: Payer: Medicare PPO | Admitting: Internal Medicine

## 2019-12-28 ENCOUNTER — Encounter: Payer: Self-pay | Admitting: Internal Medicine

## 2019-12-28 VITALS — BP 136/82 | HR 62 | Temp 97.9°F | Ht 61.0 in | Wt 113.5 lb

## 2019-12-28 DIAGNOSIS — G40909 Epilepsy, unspecified, not intractable, without status epilepticus: Secondary | ICD-10-CM | POA: Diagnosis not present

## 2019-12-28 DIAGNOSIS — S065X9A Traumatic subdural hemorrhage with loss of consciousness of unspecified duration, initial encounter: Secondary | ICD-10-CM

## 2019-12-28 DIAGNOSIS — S065XAA Traumatic subdural hemorrhage with loss of consciousness status unknown, initial encounter: Secondary | ICD-10-CM

## 2019-12-28 DIAGNOSIS — S22000D Wedge compression fracture of unspecified thoracic vertebra, subsequent encounter for fracture with routine healing: Secondary | ICD-10-CM | POA: Diagnosis not present

## 2019-12-28 LAB — COMPREHENSIVE METABOLIC PANEL
ALT: 7 U/L (ref 0–35)
AST: 14 U/L (ref 0–37)
Albumin: 3.9 g/dL (ref 3.5–5.2)
Alkaline Phosphatase: 97 U/L (ref 39–117)
BUN: 19 mg/dL (ref 6–23)
CO2: 26 mEq/L (ref 19–32)
Calcium: 9.7 mg/dL (ref 8.4–10.5)
Chloride: 97 mEq/L (ref 96–112)
Creatinine, Ser: 0.59 mg/dL (ref 0.40–1.20)
GFR: 98.35 mL/min (ref >60.00)
Glucose, Bld: 86 mg/dL (ref 70–99)
Potassium: 5.2 mEq/L — ABNORMAL HIGH (ref 3.5–5.1)
Sodium: 134 mEq/L — ABNORMAL LOW (ref 135–145)
Total Bilirubin: 0.5 mg/dL (ref 0.2–1.2)
Total Protein: 6.4 g/dL (ref 6.0–8.3)

## 2019-12-28 LAB — CBC
HCT: 41.4 % (ref 36.0–46.0)
Hemoglobin: 13.7 g/dL (ref 12.0–15.0)
MCHC: 33.2 g/dL (ref 30.0–36.0)
MCV: 95.6 fl (ref 78.0–100.0)
Platelets: 233 10*3/uL (ref 150.0–400.0)
RBC: 4.33 Mil/uL (ref 3.87–5.11)
RDW: 14.1 % (ref 11.5–15.5)
WBC: 10.1 10*3/uL (ref 4.0–10.5)

## 2019-12-28 NOTE — Assessment & Plan Note (Signed)
CT scan from yesterday shows stability No intervention should be needed

## 2019-12-28 NOTE — Assessment & Plan Note (Signed)
Pain is gone now!

## 2019-12-28 NOTE — Progress Notes (Signed)
Subjective:    Patient ID: Tanya Knight, female    DOB: [birth year], 79 y.o.   MRN: [id number]  HPI Here for follow up after syncope With DIL Jeani Hawking again This visit occurred during the SARS-CoV-2 public health emergency.  Safety protocols were in place, including screening questions prior to the visit, additional usage of staff PPE, and extensive cleaning of exam room while observing appropriate contact time as indicated for disinfecting solutions.   Doing okay Walking at home with walker Has been doing some housework---laundry, cooking, dishes Gets tired very fast  Some back pain with extended standing No syncope but does feel a little "swimmy headed"  Going back to neurologist and neurosurgeon tomorrow  prilosec has taken away all the burping and reflux  Current Outpatient Medications on File Prior to Visit  Medication Sig Dispense Refill  . amLODipine (NORVASC) 2.5 MG tablet Take 3 tablets (7.5 mg total) by mouth daily. (Patient taking differently: Take 7.5 mg by mouth every evening. ) 90 tablet 3  . aspirin 81 MG EC tablet Take 1 tablet (81 mg total) by mouth daily. Swallow whole. 30 tablet 12  . divalproex (DEPAKOTE) 250 MG DR tablet Take 3 tablets (750 mg total) by mouth 2 (two) times daily. 540 tablet 0  . metoprolol succinate (TOPROL-XL) 50 MG 24 hr tablet Take 1 tablet (50 mg total) by mouth daily. Take with or immediately following a meal. (Patient taking differently: Take 50 mg by mouth every evening. Take with or immediately following a meal.) 90 tablet 3  . Multiple Vitamin (MULTIVITAMIN) capsule Take 1 capsule by mouth daily.      Marland Kitchen omeprazole (PRILOSEC) 20 MG capsule Take 1 capsule (20 mg total) by mouth daily. 90 capsule 3   No current facility-administered medications on file prior to visit.    Allergies  Allergen Reactions  . Phenytoin     Other reaction(s): Other (See Comments) Other Reaction: Other reaction REACTION: severe reaction  . Septra  [Sulfamethoxazole-Trimethoprim]     Nausea, trouble eating and drinking    Past Medical History:  Diagnosis Date  . Fracture of metatarsal    Repair of fractured R metatarsal --Dr Duda---4/08  . GERD (gastroesophageal reflux disease)   . Hypertension   . Melanoma in situ [facility]) 7/17   Dr Kellie Moor  . Osteoporosis   . Seizure disorder ([place])   . Seizures ([place])   . Stroke ([place]) 06/2019  . Vitamin D deficiency     Past Surgical History:  Procedure Laterality Date  . CATARACT EXTRACTION, BILATERAL Bilateral 2014  . EYE SURGERY     Obstructed tear duct  . FOOT SURGERY  2008  . MELANOMA EXCISION Left 11/2015   left lower leg  . TEAR DUCT PROBING  10/12   temporary stent  . TONSILLECTOMY AND ADENOIDECTOMY  1948    Family History  Problem Relation Age of Onset  . Hypertension Mother   . Heart disease Father   . Breast cancer Daughter 4  . Diabetes Maternal Aunt   . Coronary artery disease Paternal Aunt   . Breast cancer Paternal Aunt   . Coronary artery disease Paternal Uncle   . Heart disease Maternal Grandmother   . Heart disease Maternal Grandfather   . Heart disease Paternal Grandmother   . Heart disease Paternal Grandfather   . Colon cancer Neg Hx   . Stomach cancer Neg Hx   . Pancreatic cancer Neg Hx   . Rectal cancer Neg Hx  Social History   Socioeconomic History  . Marital status: Married    Spouse name: Not on file  . Number of children: 3  . Years of education: 69  . Highest education level: Not on file  Occupational History  . Occupation: retired Financial planner: retired  Tobacco Use  . Smoking status: Never Smoker  . Smokeless tobacco: Never Used  Substance and Sexual Activity  . Alcohol use: Yes    Alcohol/week: 0.0 standard drinks    Comment: occasional  . Drug use: No  . Sexual activity: Yes    Birth control/protection: Post-menopausal  Other Topics Concern  . Not on file  Social History Narrative   09/14/19 lives at home  with husband   Regular exercise-yes---aerobics, Pilates, jogged in past (now walks)      Has living will   Husband, then one of her children, has health care POA   Would accept resuscitation but no prolonged artificial ventilation   Probably would not want tube feeds if cognitively unaware   Social Determinants of Health   Financial Resource Strain:   . Difficulty of Paying Living Expenses:   Food Insecurity:   . Worried About Charity fundraiser in the Last Year:   . Arboriculturist in the Last Year:   Transportation Needs:   . Film/video editor (Medical):   Marland Kitchen Lack of Transportation (Non-Medical):   Physical Activity:   . Days of Exercise per Week:   . Minutes of Exercise per Session:   Stress:   . Feeling of Stress :   Social Connections:   . Frequency of Communication with Friends and Family:   . Frequency of Social Gatherings with Friends and Family:   . Attends Religious Services:   . Active Member of Clubs or Organizations:   . Attends Archivist Meetings:   Marland Kitchen Marital Status:   Intimate Partner Violence:   . Fear of Current or Ex-Partner:   . Emotionally Abused:   Marland Kitchen Physically Abused:   . Sexually Abused:    Review of Systems  Appetite is good Weight up slightly Sleeping well Rib pain is gone    Objective:   Physical Exam Neurological:     Mental Status: She is alert.  Psychiatric:        Mood and Affect: Mood normal.        Behavior: Behavior normal.            Assessment & Plan:

## 2019-12-28 NOTE — Assessment & Plan Note (Signed)
On higher dose of depakote---presuming seizure as cause of her fall She does have some sedation/fatigue---- I doubt this is only from this medication (but we may need to accept some side effects). Hope for level in high therapeutic range

## 2019-12-29 ENCOUNTER — Ambulatory Visit: Payer: Medicare PPO | Admitting: Internal Medicine

## 2019-12-29 DIAGNOSIS — S065X9A Traumatic subdural hemorrhage with loss of consciousness of unspecified duration, initial encounter: Secondary | ICD-10-CM | POA: Diagnosis not present

## 2019-12-29 LAB — VALPROIC ACID LEVEL: Valproic Acid Lvl: 85.1 mg/L (ref 50.0–100.0)

## 2019-12-30 ENCOUNTER — Other Ambulatory Visit: Payer: Self-pay | Admitting: Internal Medicine

## 2019-12-30 ENCOUNTER — Other Ambulatory Visit: Payer: Self-pay | Admitting: Nurse Practitioner

## 2019-12-30 DIAGNOSIS — S065X9A Traumatic subdural hemorrhage with loss of consciousness of unspecified duration, initial encounter: Secondary | ICD-10-CM

## 2019-12-30 DIAGNOSIS — S065XAA Traumatic subdural hemorrhage with loss of consciousness status unknown, initial encounter: Secondary | ICD-10-CM

## 2019-12-30 NOTE — Progress Notes (Signed)
ASA discontinued by neurosurgery

## 2020-01-03 ENCOUNTER — Ambulatory Visit: Payer: Medicare PPO | Admitting: Adult Health

## 2020-01-05 DIAGNOSIS — I69391 Dysphagia following cerebral infarction: Secondary | ICD-10-CM | POA: Diagnosis not present

## 2020-01-05 DIAGNOSIS — I10 Essential (primary) hypertension: Secondary | ICD-10-CM | POA: Diagnosis not present

## 2020-01-05 DIAGNOSIS — H8190 Unspecified disorder of vestibular function, unspecified ear: Secondary | ICD-10-CM | POA: Diagnosis not present

## 2020-01-05 DIAGNOSIS — M810 Age-related osteoporosis without current pathological fracture: Secondary | ICD-10-CM | POA: Diagnosis not present

## 2020-01-05 DIAGNOSIS — I69293 Ataxia following other nontraumatic intracranial hemorrhage: Secondary | ICD-10-CM | POA: Diagnosis not present

## 2020-01-05 DIAGNOSIS — G40909 Epilepsy, unspecified, not intractable, without status epilepticus: Secondary | ICD-10-CM | POA: Diagnosis not present

## 2020-01-05 DIAGNOSIS — K219 Gastro-esophageal reflux disease without esophagitis: Secondary | ICD-10-CM | POA: Diagnosis not present

## 2020-01-05 DIAGNOSIS — R1310 Dysphagia, unspecified: Secondary | ICD-10-CM | POA: Diagnosis not present

## 2020-01-05 DIAGNOSIS — I69298 Other sequelae of other nontraumatic intracranial hemorrhage: Secondary | ICD-10-CM | POA: Diagnosis not present

## 2020-01-10 DIAGNOSIS — K219 Gastro-esophageal reflux disease without esophagitis: Secondary | ICD-10-CM | POA: Diagnosis not present

## 2020-01-10 DIAGNOSIS — R1310 Dysphagia, unspecified: Secondary | ICD-10-CM | POA: Diagnosis not present

## 2020-01-10 DIAGNOSIS — M810 Age-related osteoporosis without current pathological fracture: Secondary | ICD-10-CM | POA: Diagnosis not present

## 2020-01-10 DIAGNOSIS — I69293 Ataxia following other nontraumatic intracranial hemorrhage: Secondary | ICD-10-CM | POA: Diagnosis not present

## 2020-01-10 DIAGNOSIS — H8190 Unspecified disorder of vestibular function, unspecified ear: Secondary | ICD-10-CM | POA: Diagnosis not present

## 2020-01-10 DIAGNOSIS — I69298 Other sequelae of other nontraumatic intracranial hemorrhage: Secondary | ICD-10-CM | POA: Diagnosis not present

## 2020-01-10 DIAGNOSIS — I10 Essential (primary) hypertension: Secondary | ICD-10-CM | POA: Diagnosis not present

## 2020-01-10 DIAGNOSIS — G40909 Epilepsy, unspecified, not intractable, without status epilepticus: Secondary | ICD-10-CM | POA: Diagnosis not present

## 2020-01-10 DIAGNOSIS — I69391 Dysphagia following cerebral infarction: Secondary | ICD-10-CM | POA: Diagnosis not present

## 2020-01-13 DIAGNOSIS — I619 Nontraumatic intracerebral hemorrhage, unspecified: Secondary | ICD-10-CM | POA: Diagnosis not present

## 2020-01-13 DIAGNOSIS — G40909 Epilepsy, unspecified, not intractable, without status epilepticus: Secondary | ICD-10-CM | POA: Diagnosis not present

## 2020-01-13 DIAGNOSIS — I69293 Ataxia following other nontraumatic intracranial hemorrhage: Secondary | ICD-10-CM | POA: Diagnosis not present

## 2020-01-13 DIAGNOSIS — I10 Essential (primary) hypertension: Secondary | ICD-10-CM | POA: Diagnosis not present

## 2020-01-19 DIAGNOSIS — I69298 Other sequelae of other nontraumatic intracranial hemorrhage: Secondary | ICD-10-CM | POA: Diagnosis not present

## 2020-01-19 DIAGNOSIS — M810 Age-related osteoporosis without current pathological fracture: Secondary | ICD-10-CM | POA: Diagnosis not present

## 2020-01-19 DIAGNOSIS — G40909 Epilepsy, unspecified, not intractable, without status epilepticus: Secondary | ICD-10-CM | POA: Diagnosis not present

## 2020-01-19 DIAGNOSIS — I10 Essential (primary) hypertension: Secondary | ICD-10-CM | POA: Diagnosis not present

## 2020-01-19 DIAGNOSIS — H8190 Unspecified disorder of vestibular function, unspecified ear: Secondary | ICD-10-CM | POA: Diagnosis not present

## 2020-01-19 DIAGNOSIS — K219 Gastro-esophageal reflux disease without esophagitis: Secondary | ICD-10-CM | POA: Diagnosis not present

## 2020-01-19 DIAGNOSIS — I69391 Dysphagia following cerebral infarction: Secondary | ICD-10-CM | POA: Diagnosis not present

## 2020-01-19 DIAGNOSIS — R1310 Dysphagia, unspecified: Secondary | ICD-10-CM | POA: Diagnosis not present

## 2020-01-19 DIAGNOSIS — I69293 Ataxia following other nontraumatic intracranial hemorrhage: Secondary | ICD-10-CM | POA: Diagnosis not present

## 2020-01-26 DIAGNOSIS — I69293 Ataxia following other nontraumatic intracranial hemorrhage: Secondary | ICD-10-CM | POA: Diagnosis not present

## 2020-01-26 DIAGNOSIS — K219 Gastro-esophageal reflux disease without esophagitis: Secondary | ICD-10-CM | POA: Diagnosis not present

## 2020-01-26 DIAGNOSIS — R1310 Dysphagia, unspecified: Secondary | ICD-10-CM | POA: Diagnosis not present

## 2020-01-26 DIAGNOSIS — G40909 Epilepsy, unspecified, not intractable, without status epilepticus: Secondary | ICD-10-CM | POA: Diagnosis not present

## 2020-01-26 DIAGNOSIS — I10 Essential (primary) hypertension: Secondary | ICD-10-CM | POA: Diagnosis not present

## 2020-01-26 DIAGNOSIS — M810 Age-related osteoporosis without current pathological fracture: Secondary | ICD-10-CM | POA: Diagnosis not present

## 2020-01-26 DIAGNOSIS — I69298 Other sequelae of other nontraumatic intracranial hemorrhage: Secondary | ICD-10-CM | POA: Diagnosis not present

## 2020-01-26 DIAGNOSIS — I69391 Dysphagia following cerebral infarction: Secondary | ICD-10-CM | POA: Diagnosis not present

## 2020-01-26 DIAGNOSIS — H8190 Unspecified disorder of vestibular function, unspecified ear: Secondary | ICD-10-CM | POA: Diagnosis not present

## 2020-01-27 ENCOUNTER — Ambulatory Visit
Admission: RE | Admit: 2020-01-27 | Discharge: 2020-01-27 | Disposition: A | Discharge status: Home / Self Care | Payer: Medicare PPO | Source: Ambulatory Visit | Attending: Nurse Practitioner | Admitting: Nurse Practitioner

## 2020-01-27 ENCOUNTER — Other Ambulatory Visit: Payer: Self-pay

## 2020-01-27 DIAGNOSIS — S065XAA Traumatic subdural hemorrhage with loss of consciousness status unknown, initial encounter: Secondary | ICD-10-CM

## 2020-01-27 DIAGNOSIS — S065X9A Traumatic subdural hemorrhage with loss of consciousness of unspecified duration, initial encounter: Secondary | ICD-10-CM | POA: Insufficient documentation

## 2020-01-27 DIAGNOSIS — I6201 Nontraumatic acute subdural hemorrhage: Secondary | ICD-10-CM | POA: Diagnosis not present

## 2020-01-27 DIAGNOSIS — G9389 Other specified disorders of brain: Secondary | ICD-10-CM | POA: Diagnosis not present

## 2020-01-27 DIAGNOSIS — I6203 Nontraumatic chronic subdural hemorrhage: Secondary | ICD-10-CM | POA: Diagnosis not present

## 2020-01-27 DIAGNOSIS — S065X0A Traumatic subdural hemorrhage without loss of consciousness, initial encounter: Secondary | ICD-10-CM | POA: Diagnosis not present

## 2020-01-27 IMAGING — CT CT HEAD W/O CM
4 series · 14 of 47 positions shown, 16 images · non-contrast
Comparison: [DATE]

CLINICAL DATA: Fell [DATE] with history of subdural hematoma

EXAM:
CT HEAD WITHOUT CONTRAST
TECHNIQUE: Contiguous axial images were obtained from the base of the skull
through the vertex without intravenous contrast.

[Series 2: axial st head 5.00 ax · axial · 0.33mm/px · z∈[-520,-421]mm · 5 of 30 slices shown, 7 images]
[im 5/30  brain]
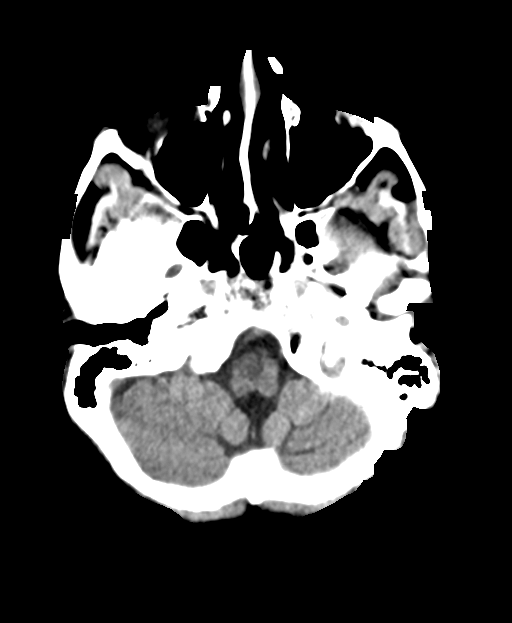
[im 5/30  bone]
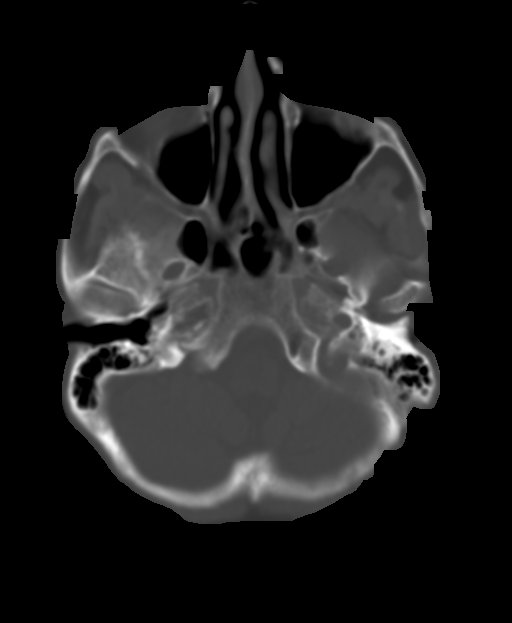
[im 10/30  brain]
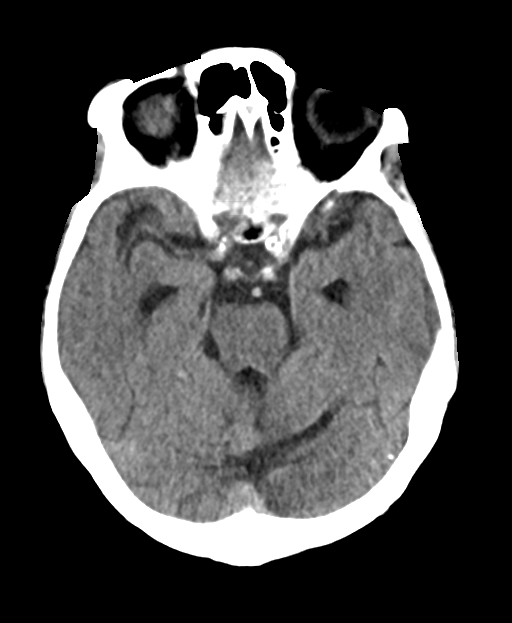
[im 15/30  brain]
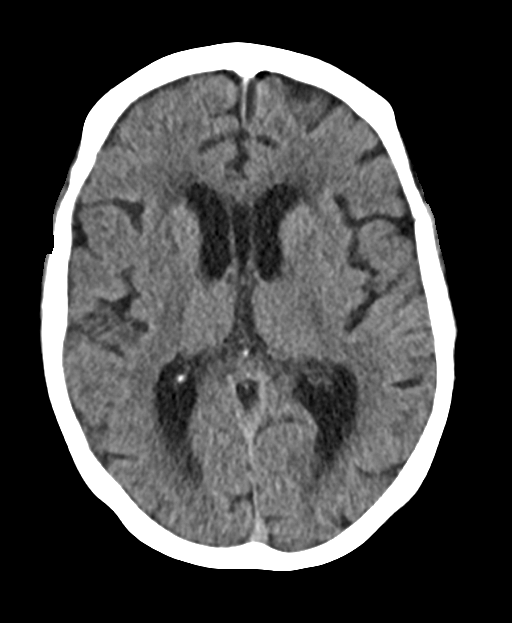
[im 20/30  brain]
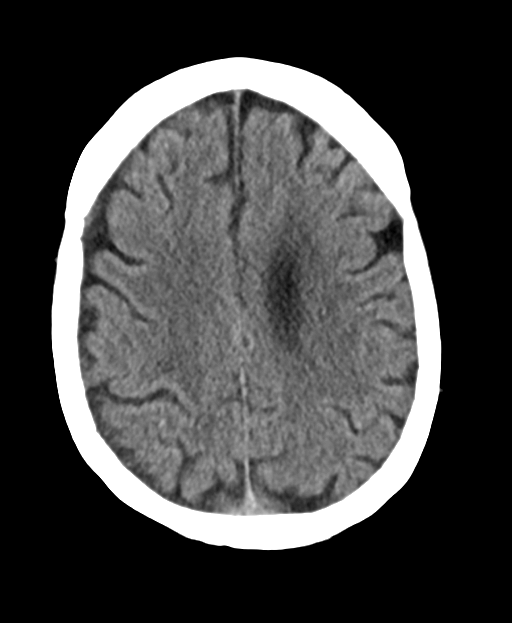
[im 25/30  brain]
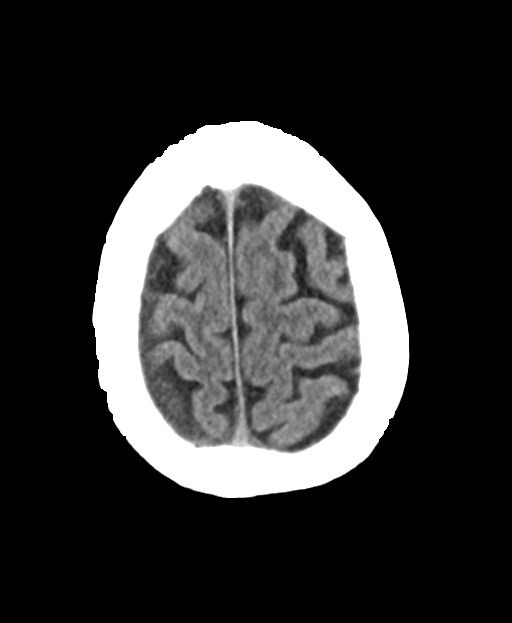
[im 25/30  bone]
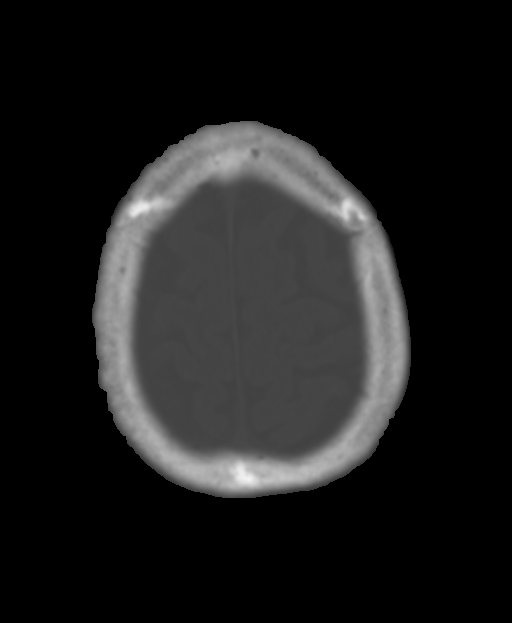

[Series 4: axial bone head 1.50 ax · axial · 0.33mm/px · z∈[-528,-494]mm · 3 of 103 slices shown]
[im 10/103  bone]
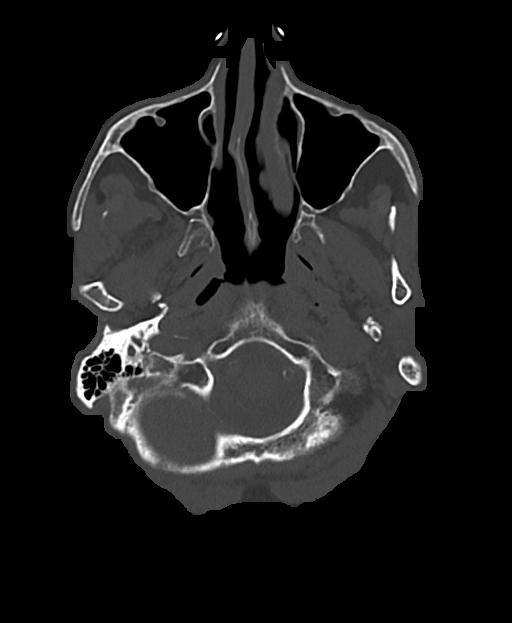
[im 19/103  bone]
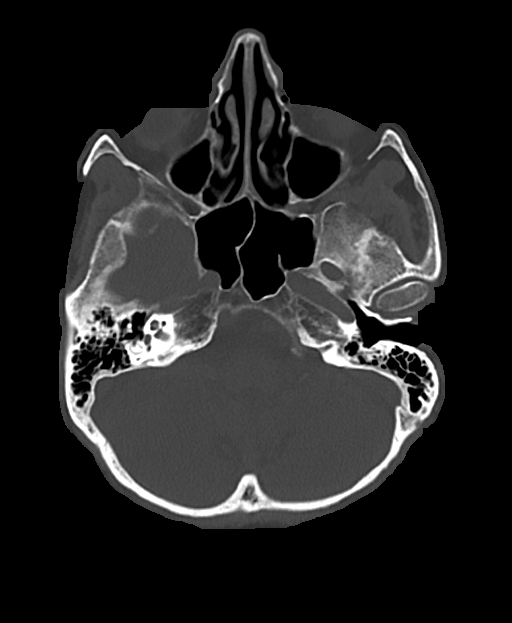
[im 33/103  bone]
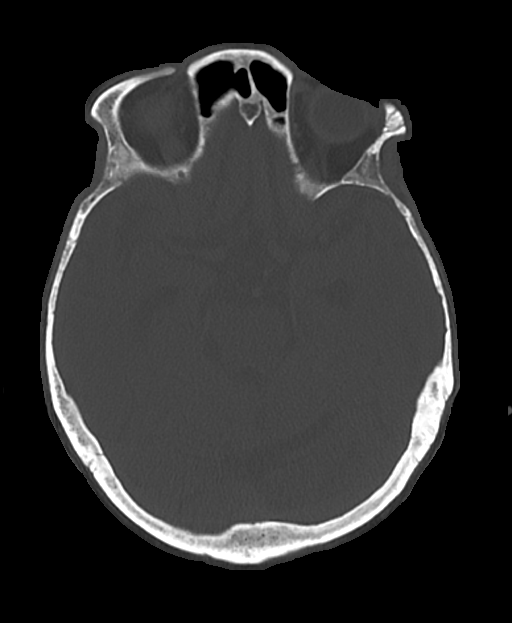

[Series 6: coronals head 3.00 cor · coronal · 0.30mm/px · 3 of 69 slices shown]
[im 23/69  brain]
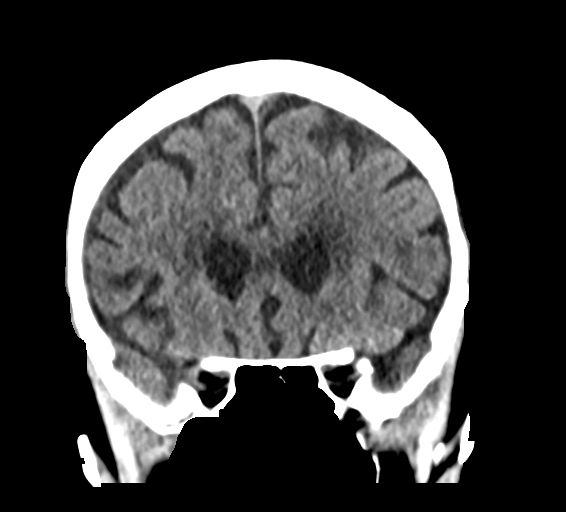
[im 31/69  brain]
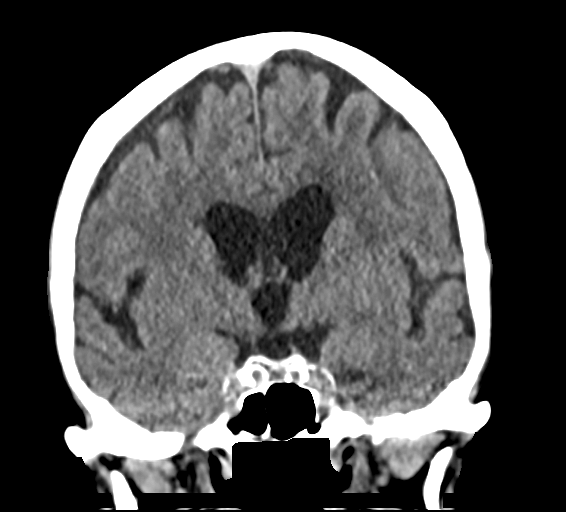
[im 38/69  brain]
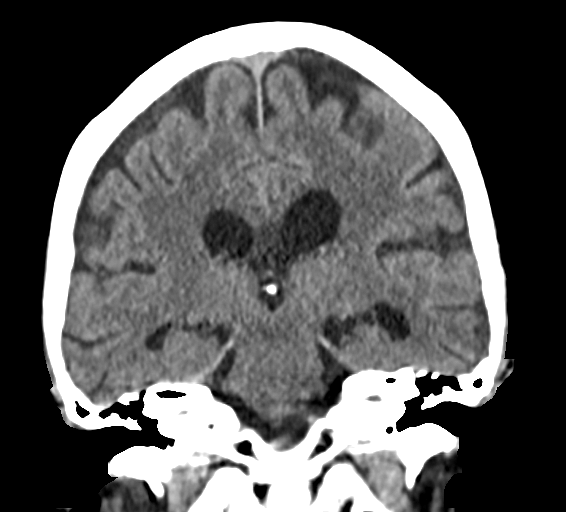

[Series 8: sagittals head 3.00 sag · sagittal · 0.30mm/px · 3 of 57 slices shown]
[im 19/57  brain]
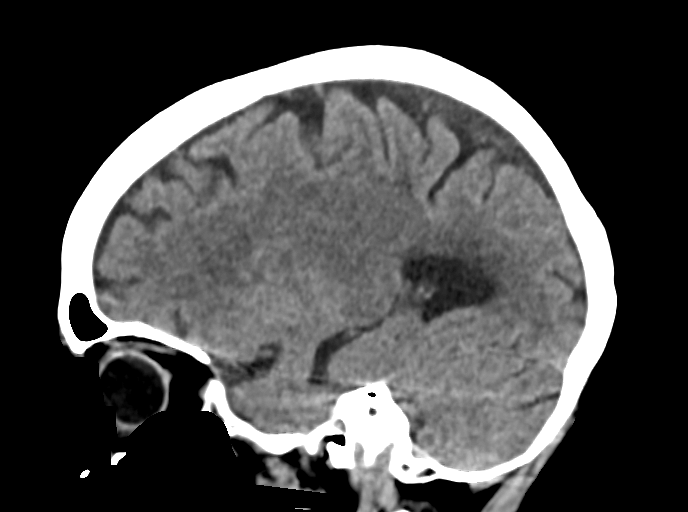
[im 29/57  brain]
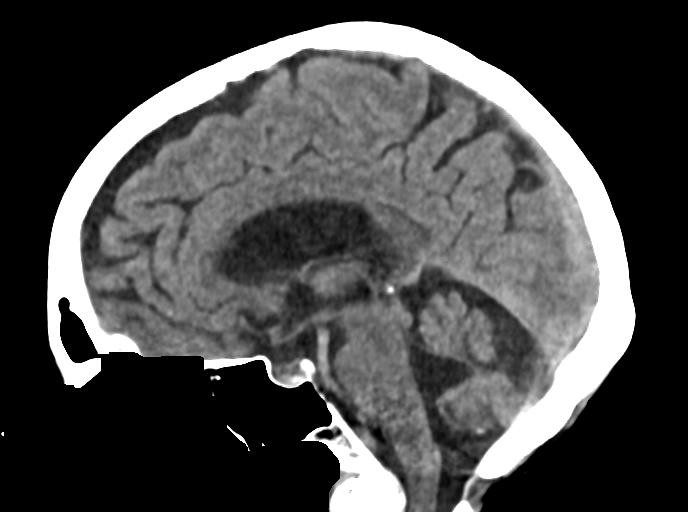
[im 38/57  brain]
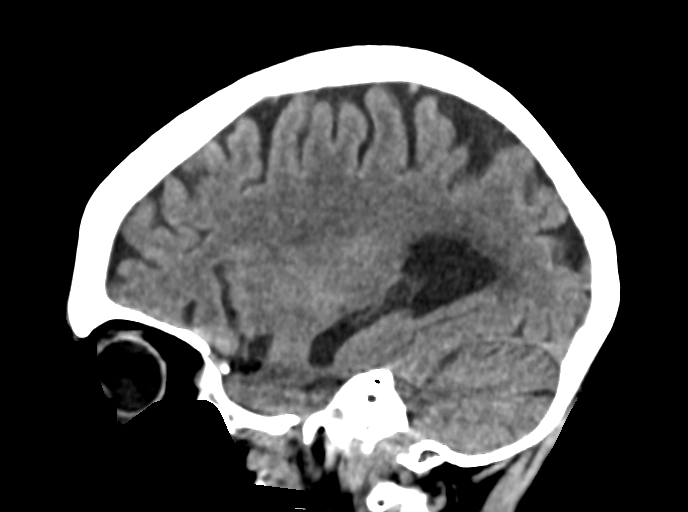

[14 of 47 positions shown; findings below may reference images not displayed]

FINDINGS: Brain: No acute infarct or hemorrhage. The lateral ventricles and
midline structures are unremarkable. Cavum septum pellucidum again
noted, a frequent anatomic variant. Chronic encephalomalacia left
cerebellar hemisphere. The chronic right subdural hematoma seen
previously has decreased in size, now measuring only 8 mm in
thickness. There is resolution of the mass effect seen previously.

Vascular: No hyperdense vessel or unexpected calcification.

Skull: Normal. Negative for fracture or focal lesion.

Sinuses/Orbits: No acute finding.

Other: Choose 1
IMPRESSION: 1. Decreased size of the chronic right cerebral convexity subdural
hematoma. Residual chronic subdural measures 8 mm, with no mass
effect.
2. No acute infarct or hemorrhage.

## 2020-01-31 DIAGNOSIS — S065X9A Traumatic subdural hemorrhage with loss of consciousness of unspecified duration, initial encounter: Secondary | ICD-10-CM | POA: Diagnosis not present

## 2020-02-02 DIAGNOSIS — I69391 Dysphagia following cerebral infarction: Secondary | ICD-10-CM | POA: Diagnosis not present

## 2020-02-02 DIAGNOSIS — I69298 Other sequelae of other nontraumatic intracranial hemorrhage: Secondary | ICD-10-CM | POA: Diagnosis not present

## 2020-02-02 DIAGNOSIS — I69293 Ataxia following other nontraumatic intracranial hemorrhage: Secondary | ICD-10-CM | POA: Diagnosis not present

## 2020-02-02 DIAGNOSIS — G40909 Epilepsy, unspecified, not intractable, without status epilepticus: Secondary | ICD-10-CM | POA: Diagnosis not present

## 2020-02-02 DIAGNOSIS — I10 Essential (primary) hypertension: Secondary | ICD-10-CM | POA: Diagnosis not present

## 2020-02-02 DIAGNOSIS — K219 Gastro-esophageal reflux disease without esophagitis: Secondary | ICD-10-CM | POA: Diagnosis not present

## 2020-02-02 DIAGNOSIS — M810 Age-related osteoporosis without current pathological fracture: Secondary | ICD-10-CM | POA: Diagnosis not present

## 2020-02-02 DIAGNOSIS — R1310 Dysphagia, unspecified: Secondary | ICD-10-CM | POA: Diagnosis not present

## 2020-02-02 DIAGNOSIS — H8190 Unspecified disorder of vestibular function, unspecified ear: Secondary | ICD-10-CM | POA: Diagnosis not present

## 2020-02-09 DIAGNOSIS — E785 Hyperlipidemia, unspecified: Secondary | ICD-10-CM

## 2020-02-09 DIAGNOSIS — I69298 Other sequelae of other nontraumatic intracranial hemorrhage: Secondary | ICD-10-CM | POA: Diagnosis not present

## 2020-02-09 DIAGNOSIS — H8190 Unspecified disorder of vestibular function, unspecified ear: Secondary | ICD-10-CM | POA: Diagnosis not present

## 2020-02-09 DIAGNOSIS — R1310 Dysphagia, unspecified: Secondary | ICD-10-CM | POA: Diagnosis not present

## 2020-02-09 DIAGNOSIS — K219 Gastro-esophageal reflux disease without esophagitis: Secondary | ICD-10-CM | POA: Diagnosis not present

## 2020-02-09 DIAGNOSIS — I69293 Ataxia following other nontraumatic intracranial hemorrhage: Secondary | ICD-10-CM | POA: Diagnosis not present

## 2020-02-09 DIAGNOSIS — E876 Hypokalemia: Secondary | ICD-10-CM

## 2020-02-09 DIAGNOSIS — G40909 Epilepsy, unspecified, not intractable, without status epilepticus: Secondary | ICD-10-CM | POA: Diagnosis not present

## 2020-02-09 DIAGNOSIS — I10 Essential (primary) hypertension: Secondary | ICD-10-CM | POA: Diagnosis not present

## 2020-02-09 DIAGNOSIS — H532 Diplopia: Secondary | ICD-10-CM

## 2020-02-09 DIAGNOSIS — Z9181 History of falling: Secondary | ICD-10-CM

## 2020-02-09 DIAGNOSIS — E559 Vitamin D deficiency, unspecified: Secondary | ICD-10-CM

## 2020-02-09 DIAGNOSIS — I69391 Dysphagia following cerebral infarction: Secondary | ICD-10-CM | POA: Diagnosis not present

## 2020-02-09 DIAGNOSIS — Z85820 Personal history of malignant melanoma of skin: Secondary | ICD-10-CM

## 2020-02-09 DIAGNOSIS — M810 Age-related osteoporosis without current pathological fracture: Secondary | ICD-10-CM | POA: Diagnosis not present

## 2020-02-12 DIAGNOSIS — I10 Essential (primary) hypertension: Secondary | ICD-10-CM | POA: Diagnosis not present

## 2020-02-12 DIAGNOSIS — G40909 Epilepsy, unspecified, not intractable, without status epilepticus: Secondary | ICD-10-CM | POA: Diagnosis not present

## 2020-02-12 DIAGNOSIS — I619 Nontraumatic intracerebral hemorrhage, unspecified: Secondary | ICD-10-CM | POA: Diagnosis not present

## 2020-02-12 DIAGNOSIS — I69293 Ataxia following other nontraumatic intracranial hemorrhage: Secondary | ICD-10-CM | POA: Diagnosis not present

## 2020-02-16 DIAGNOSIS — I69298 Other sequelae of other nontraumatic intracranial hemorrhage: Secondary | ICD-10-CM | POA: Diagnosis not present

## 2020-02-16 DIAGNOSIS — K219 Gastro-esophageal reflux disease without esophagitis: Secondary | ICD-10-CM | POA: Diagnosis not present

## 2020-02-16 DIAGNOSIS — I10 Essential (primary) hypertension: Secondary | ICD-10-CM | POA: Diagnosis not present

## 2020-02-16 DIAGNOSIS — M810 Age-related osteoporosis without current pathological fracture: Secondary | ICD-10-CM | POA: Diagnosis not present

## 2020-02-16 DIAGNOSIS — G40909 Epilepsy, unspecified, not intractable, without status epilepticus: Secondary | ICD-10-CM | POA: Diagnosis not present

## 2020-02-16 DIAGNOSIS — R1310 Dysphagia, unspecified: Secondary | ICD-10-CM | POA: Diagnosis not present

## 2020-02-16 DIAGNOSIS — I69391 Dysphagia following cerebral infarction: Secondary | ICD-10-CM | POA: Diagnosis not present

## 2020-02-16 DIAGNOSIS — H8190 Unspecified disorder of vestibular function, unspecified ear: Secondary | ICD-10-CM | POA: Diagnosis not present

## 2020-02-16 DIAGNOSIS — I69293 Ataxia following other nontraumatic intracranial hemorrhage: Secondary | ICD-10-CM | POA: Diagnosis not present

## 2020-02-21 ENCOUNTER — Telehealth: Payer: Self-pay

## 2020-02-21 NOTE — Telephone Encounter (Signed)
Tanya Knight (Do not see on DPR) pts daughter in law said that pt is due for 3rd pfizer covid vaccine and Tanya Knight wants to know if pt can get both high dose flu shot and covid vaccine at same time. Advised the guidelines do allow pts to take the covid vaccine and flu shot at same time. Tanya Knight request note sent to Dr Silvio Pate to ask this question since pt had covid vaccine in 05/2019 and then later had a stroke; pt is still in weaken state and Dr Silvio Pate has seen pt and is aware of how pt is doing. Pt also wants to know Dr Alla German advice if pt should get the covid vaccine and flu vaccine at [facility] [address] or should pt get [facility] vaccine at [facility] and get the flu shot at [facility]. Tanya Knight request cb when Dr Silvio Pate has reviewed the note.

## 2020-02-21 NOTE — Telephone Encounter (Signed)
I thought the stroke was after the first one---and it was not felt to be caused by the COVID vaccine. She also had her second one later, and I don't know of issues with that one. I would recommend both vaccines---they can both be done at [facility] on the appropriate schedule (I would recommend the COVID first)

## 2020-02-23 DIAGNOSIS — I69391 Dysphagia following cerebral infarction: Secondary | ICD-10-CM | POA: Diagnosis not present

## 2020-02-23 DIAGNOSIS — K219 Gastro-esophageal reflux disease without esophagitis: Secondary | ICD-10-CM | POA: Diagnosis not present

## 2020-02-23 DIAGNOSIS — R1310 Dysphagia, unspecified: Secondary | ICD-10-CM | POA: Diagnosis not present

## 2020-02-23 DIAGNOSIS — H8190 Unspecified disorder of vestibular function, unspecified ear: Secondary | ICD-10-CM | POA: Diagnosis not present

## 2020-02-23 DIAGNOSIS — I69298 Other sequelae of other nontraumatic intracranial hemorrhage: Secondary | ICD-10-CM | POA: Diagnosis not present

## 2020-02-23 DIAGNOSIS — G40909 Epilepsy, unspecified, not intractable, without status epilepticus: Secondary | ICD-10-CM | POA: Diagnosis not present

## 2020-02-23 DIAGNOSIS — I10 Essential (primary) hypertension: Secondary | ICD-10-CM | POA: Diagnosis not present

## 2020-02-23 DIAGNOSIS — M810 Age-related osteoporosis without current pathological fracture: Secondary | ICD-10-CM | POA: Diagnosis not present

## 2020-02-23 DIAGNOSIS — I69293 Ataxia following other nontraumatic intracranial hemorrhage: Secondary | ICD-10-CM | POA: Diagnosis not present

## 2020-02-23 NOTE — Telephone Encounter (Signed)
Left message for pt's son, Pilar Plate to call office. DIL is not on DPR.

## 2020-02-23 NOTE — Telephone Encounter (Signed)
Spoke to pt's son per DPR.

## 2020-02-29 ENCOUNTER — Ambulatory Visit: Payer: Medicare PPO | Admitting: Neurology

## 2020-02-29 ENCOUNTER — Encounter: Payer: Self-pay | Admitting: Neurology

## 2020-02-29 VITALS — BP 131/78 | HR 60 | Ht 62.0 in | Wt 113.2 lb

## 2020-02-29 DIAGNOSIS — I614 Nontraumatic intracerebral hemorrhage in cerebellum: Secondary | ICD-10-CM

## 2020-02-29 DIAGNOSIS — S065X9A Traumatic subdural hemorrhage with loss of consciousness of unspecified duration, initial encounter: Secondary | ICD-10-CM

## 2020-02-29 DIAGNOSIS — Z87898 Personal history of other specified conditions: Secondary | ICD-10-CM | POA: Diagnosis not present

## 2020-02-29 DIAGNOSIS — R260 Ataxic gait: Secondary | ICD-10-CM

## 2020-02-29 DIAGNOSIS — S065XAA Traumatic subdural hemorrhage with loss of consciousness status unknown, initial encounter: Secondary | ICD-10-CM

## 2020-02-29 NOTE — Progress Notes (Signed)
[facility] [address] [place]. [place] [address] [phone]       OFFICE CONSULT NOTE  [name] Date of Birth:  08/13/1940 Medical Record Number:  [id number]   Referring MD: Sarita Bottom, PA-C Reason for Referral: Falls and subdural hemorrhage  HPI: Tanya Knight is a pleasant 79 year old Caucasian lady seen today for initial office consultation visit.  She is accompanied by her son Tanya Knight.  History is obtained from them, review of electronic medical records and personally reviewed available pertinent imaging films in PACS.  She has a past medical history of remote seizure disorder since 1998 long-term valproic acid without breakthrough, hemorrhagic cerebellar stroke in January 2021 with residual gait imbalance, gastroesophageal reflux disease, hypertension and osteoporosis.  She was admitted on 7, July 2021 following a fall and an episode of brief loss of consciousness felt to be a syncopal event.  She was seen by Dr. Alexis Goodell in consultation.  She had apparently no warning but she lost consciousness and fell abruptly to the right side.  After the event she had hard time speaking around 20 to 30 minutes.  CT scan of the head showed small right frontal subdural hematoma measuring 5 mm in thickness without significant midline shift or mass-effect.  MRI scan of the brain confirmed acute subdural hematoma measuring 13 mm in maximal thickness at the level of the right frontal convexity.  She was treated conservatively and did not require neurosurgical evacuation.  CT angiogram of the brain showed stable appearance of the 2 mm aneurysm of the distal right supraclinoid ICA.  She subsequently had follow-up CT scans on 12/28/2019 as well as the most recent on 01/28/2020 which showed decreased size of the chronic right cerebral convexity subdural hematoma now measuring 8 mm but no significant mass-effect or midline shift.  Patient is currently participating in home physical  and occupational therapy and states her gait and balance have improved.  She is uses a walker to walk but indoors can use a hold onto the furniture.  She has had no recent falls.  She is tolerating Depakote 750 twice daily without any side effects however she has not had any valproic acid level checked recently.  Blood pressure is well controlled and today it is 131/78.  She was admitted to [facility] in January 2021 due to cerebellar vermian midline hemorrhage thought to be hypertensive.  ROS:   14 system review of systems is positive for fall, gait difficulties, minor headache, imbalance all other systems negative  PMH:  Past Medical History:  Diagnosis Date  . Fracture of metatarsal    Repair of fractured R metatarsal --Dr Duda---4/08  . GERD (gastroesophageal reflux disease)   . Hypertension   . Melanoma in situ [facility]) 7/17   Dr Kellie Moor  . Osteoporosis   . Seizure disorder ([place])   . Seizures ([place])   . Stroke ([place]) 06/2019  . Vitamin D deficiency     Social History:  Social History   Socioeconomic History  . Marital status: Married    Spouse name: Tanya Knight  . Number of children: 3  . Years of education: 51  . Highest education level: Not on file  Occupational History  . Occupation: retired Financial planner: retired  Tobacco Use  . Smoking status: Never Smoker  . Smokeless tobacco: Never Used  Substance and Sexual Activity  . Alcohol use: Yes    Alcohol/week: 0.0 standard drinks    Comment: occasional  .  Drug use: No  . Sexual activity: Yes    Birth control/protection: Post-menopausal  Other Topics Concern  . Not on file  Social History Narrative   09/14/19 lives at home with husband   Regular exercise-yes---aerobics, Pilates, jogged in past (now walks)      Has living will   Husband, then one of her children, has health care POA   Would accept resuscitation but no prolonged artificial ventilation   Probably would not want tube feeds if  cognitively unaware      Right Handed   Drinks 3-4 cups caffeine daily   Social Determinants of Health   Financial Resource Strain:   . Difficulty of Paying Living Expenses: Not on file  Food Insecurity:   . Worried About Charity fundraiser in the Last Year: Not on file  . Ran Out of Food in the Last Year: Not on file  Transportation Needs:   . Lack of Transportation (Medical): Not on file  . Lack of Transportation (Non-Medical): Not on file  Physical Activity:   . Days of Exercise per Week: Not on file  . Minutes of Exercise per Session: Not on file  Stress:   . Feeling of Stress : Not on file  Social Connections:   . Frequency of Communication with Friends and Family: Not on file  . Frequency of Social Gatherings with Friends and Family: Not on file  . Attends Religious Services: Not on file  . Active Member of Clubs or Organizations: Not on file  . Attends Archivist Meetings: Not on file  . Marital Status: Not on file  Intimate Partner Violence:   . Fear of Current or Ex-Partner: Not on file  . Emotionally Abused: Not on file  . Physically Abused: Not on file  . Sexually Abused: Not on file    Medications:   Current Outpatient Medications on File Prior to Visit  Medication Sig Dispense Refill  . amLODipine (NORVASC) 2.5 MG tablet Take 3 tablets (7.5 mg total) by mouth daily. (Patient taking differently: Take 7.5 mg by mouth every evening. ) 90 tablet 3  . divalproex (DEPAKOTE) 250 MG DR tablet Take 3 tablets (750 mg total) by mouth 2 (two) times daily. 540 tablet 0  . metoprolol succinate (TOPROL-XL) 50 MG 24 hr tablet Take 1 tablet (50 mg total) by mouth daily. Take with or immediately following a meal. (Patient taking differently: Take 50 mg by mouth every evening. Take with or immediately following a meal.) 90 tablet 3  . Multiple Vitamin (MULTIVITAMIN) capsule Take 1 capsule by mouth daily.      Marland Kitchen omeprazole (PRILOSEC) 20 MG capsule Take 1 capsule (20 mg  total) by mouth daily. 90 capsule 3   No current facility-administered medications on file prior to visit.    Allergies:   Allergies  Allergen Reactions  . Phenytoin     Other reaction(s): Other (See Comments) Other Reaction: Other reaction REACTION: severe reaction  . Septra [Sulfamethoxazole-Trimethoprim]     Nausea, trouble eating and drinking    Physical Exam General: Frail cachectic malnourished looking elderly Caucasian lady seated, in no evident distress Head: head normocephalic and atraumatic.   Neck: supple with no carotid or supraclavicular bruits Cardiovascular: regular rate and rhythm, no murmurs Musculoskeletal: Severe kyphoscoliosis Skin:  no rash/petichiae Vascular:  Normal pulses all extremities  Neurologic Exam Mental Status: Awake and fully alert. Oriented to place and time. Recent and remote memory intact. Attention span, concentration and fund of knowledge  appropriate. Mood and affect appropriate.  Cranial Nerves: Fundoscopic exam reveals sharp disc margins. Pupils equal, briskly reactive to light. Extraocular movements full without nystagmus. Visual fields full to confrontation. Hearing reduced on the right. Facial sensation intact. Face, tongue, palate moves normally and symmetrically.  Motor: Normal bulk and tone. Normal strength in all tested extremity muscles. Sensory.: intact to touch , pinprick , position and vibratory sensation.  Coordination: Rapid alternating movements normal in all extremities. Finger-to-nose and heel-to-shin show mild incoordination on the left.   Gait and Station: Arises from chair with some difficulty. Stance is stooped. Gait demonstrates mild imbalance.  Unable to walk tandem.   Reflexes: 1+ and symmetric. Toes downgoing.       ASSESSMENT: 79 year old Caucasian lady with posttraumatic subdural hematoma in July 2021 from which she still has some mild residual gait and balance difficulties.  Remote history of seizures since 1998  well-controlled on Depakote and she has been seizure-free for many years.     PLAN: I had a long discussion with the patient and her son regarding her recent fall and subdural hematoma and she seems to be doing well with practically no residual symptoms except gait and balance difficulties.  I recommend she use a walker at all times and continue ongoing home physical occupational therapy.  We also discussed fall precautions.  She has remote history of seizures and recommend she continue Depakote but we will check level today and if high may need to reduce the dose since she has been seizure-free for years.  Continue strict control of hypertension with blood pressure goal below 130/90 given her previous history of hypertensive intracerebral hemorrhage.  Greater than 50% time during this 45-minute consultation visit was spent on counseling and coordination of care about her posttraumatic subdural hematoma and remote seizures discussion and answering questions she will return for follow-up in the future only as needed. Antony Contras, MD  [facility] [address] [place], [place] [address]  Phone [phone] Fax [phone] Note: This document was prepared with digital dictation and possible smart phrase technology. Any transcriptional errors that result from this process are unintentional.

## 2020-02-29 NOTE — Patient Instructions (Signed)
I had a long discussion with the patient and Tanya Knight son regarding Tanya Knight recent fall and subdural hematoma and she seems to be doing well with practically no residual symptoms except gait and balance difficulties.  I recommend she use a walker at all times and continue ongoing home physical occupational therapy.  We also discussed fall precautions.  She has remote history of seizures and recommend she continue Depakote but we will check level today and if high may need to reduce the dose since she has been seizure-free for years.  Continue strict control of hypertension with blood pressure goal below 130/90 given Tanya Knight previous history of hypertensive intracerebral hemorrhage.  She will return for follow-up in the future only as needed.  Fall Prevention in the Home, Adult Falls can cause injuries. They can happen to people of all ages. There are many things you can do to make your home safe and to help prevent falls. Ask for help when making these changes, if needed. What actions can I take to prevent falls? General Instructions  Use good lighting in all rooms. Replace any light bulbs that burn out.  Turn on the lights when you go into a dark area. Use night-lights.  Keep items that you use often in easy-to-reach places. Lower the shelves around your home if necessary.  Set up your furniture so you have a clear path. Avoid moving your furniture around.  Do not have throw rugs and other things on the floor that can make you trip.  Avoid walking on wet floors.  If any of your floors are uneven, fix them.  Add color or contrast paint or tape to clearly mark and help you see: ? Any grab bars or handrails. ? First and last steps of stairways. ? Where the edge of each step is.  If you use a stepladder: ? Make sure that it is fully opened. Do not climb a closed stepladder. ? Make sure that both sides of the stepladder are locked into place. ? Ask someone to hold the stepladder for you while you use  it.  If there are any pets around you, be aware of where they are. What can I do in the bathroom?      Keep the floor dry. Clean up any water that spills onto the floor as soon as it happens.  Remove soap buildup in the tub or shower regularly.  Use non-skid mats or decals on the floor of the tub or shower.  Attach bath mats securely with double-sided, non-slip rug tape.  If you need to sit down in the shower, use a plastic, non-slip stool.  Install grab bars by the toilet and in the tub and shower. Do not use towel bars as grab bars. What can I do in the bedroom?  Make sure that you have a light by your bed that is easy to reach.  Do not use any sheets or blankets that are too big for your bed. They should not hang down onto the floor.  Have a firm chair that has side arms. You can use this for support while you get dressed. What can I do in the kitchen?  Clean up any spills right away.  If you need to reach something above you, use a strong step stool that has a grab bar.  Keep electrical cords out of the way.  Do not use floor polish or wax that makes floors slippery. If you must use wax, use non-skid floor wax. What can  I do with my stairs?  Do not leave any items on the stairs.  Make sure that you have a light switch at the top of the stairs and the bottom of the stairs. If you do not have them, ask someone to add them for you.  Make sure that there are handrails on both sides of the stairs, and use them. Fix handrails that are broken or loose. Make sure that handrails are as long as the stairways.  Install non-slip stair treads on all stairs in your home.  Avoid having throw rugs at the top or bottom of the stairs. If you do have throw rugs, attach them to the floor with carpet tape.  Choose a carpet that does not hide the edge of the steps on the stairway.  Check any carpeting to make sure that it is firmly attached to the stairs. Fix any carpet that is loose  or worn. What can I do on the outside of my home?  Use bright outdoor lighting.  Regularly fix the edges of walkways and driveways and fix any cracks.  Remove anything that might make you trip as you walk through a door, such as a raised step or threshold.  Trim any bushes or trees on the path to your home.  Regularly check to see if handrails are loose or broken. Make sure that both sides of any steps have handrails.  Install guardrails along the edges of any raised decks and porches.  Clear walking paths of anything that might make someone trip, such as tools or rocks.  Have any leaves, snow, or ice cleared regularly.  Use sand or salt on walking paths during winter.  Clean up any spills in your garage right away. This includes grease or oil spills. What other actions can I take?  Wear shoes that: ? Have a low heel. Do not wear high heels. ? Have rubber bottoms. ? Are comfortable and fit you well. ? Are closed at the toe. Do not wear open-toe sandals.  Use tools that help you move around (mobility aids) if they are needed. These include: ? Canes. ? Walkers. ? Scooters. ? Crutches.  Review your medicines with your doctor. Some medicines can make you feel dizzy. This can increase your chance of falling. Ask your doctor what other things you can do to help prevent falls. Where to find more information  Centers for Disease Control and Prevention, STEADI: https://garcia.biz/  [employer] on Aging: BrainJudge.co.uk Contact a doctor if:  You are afraid of falling at home.  You feel weak, drowsy, or dizzy at home.  You fall at home. Summary  There are many simple things that you can do to make your home safe and to help prevent falls.  Ways to make your home safe include removing tripping hazards and installing grab bars in the bathroom.  Ask for help when making these changes in your home. This information is not intended to replace advice given to  you by your health care provider. Make sure you discuss any questions you have with your health care provider. Document Revised: 08/19/2018 Document Reviewed: 12/11/2016 Elsevier Patient Education  2020 [employer].

## 2020-03-01 LAB — VALPROIC ACID LEVEL: Valproic Acid Lvl: 145 ug/mL (ref 50–100)

## 2020-03-02 ENCOUNTER — Other Ambulatory Visit: Payer: Self-pay | Admitting: Internal Medicine

## 2020-03-02 MED ORDER — DIVALPROEX SODIUM 250 MG PO DR TAB: 500.0000 mg | DELAYED_RELEASE_TABLET | Freq: Two times a day (BID) | ORAL | 0 refills | Status: DC

## 2020-03-02 NOTE — Progress Notes (Signed)
Kindly inform the patient that valproic acid level came back quite high and I recommend she reduce the dose to 250 mg 2 tablets twice daily

## 2020-03-06 ENCOUNTER — Telehealth: Payer: Self-pay | Admitting: Emergency Medicine

## 2020-03-06 NOTE — Telephone Encounter (Signed)
Called patient and discussed Dr. Clydene Fake findings regarding patient's blood work/valproid acid.  Patient was able to verbalize back to me the high level of valproic acid (145) and to reduce her dosage to 250 mg, 2 tablets two times a day.  Patient had no other questions and expressed appreciation.

## 2020-03-06 NOTE — Telephone Encounter (Signed)
-----   Message from Garvin Fila, MD sent at 03/02/2020 12:23 PM EDT ----- Mitchell Heir inform the patient that valproic acid level came back quite high and I recommend she reduce the dose to 250 mg 2 tablets twice daily

## 2020-03-08 DIAGNOSIS — G40909 Epilepsy, unspecified, not intractable, without status epilepticus: Secondary | ICD-10-CM | POA: Diagnosis not present

## 2020-03-08 DIAGNOSIS — H8190 Unspecified disorder of vestibular function, unspecified ear: Secondary | ICD-10-CM | POA: Diagnosis not present

## 2020-03-08 DIAGNOSIS — I10 Essential (primary) hypertension: Secondary | ICD-10-CM | POA: Diagnosis not present

## 2020-03-08 DIAGNOSIS — I69391 Dysphagia following cerebral infarction: Secondary | ICD-10-CM | POA: Diagnosis not present

## 2020-03-08 DIAGNOSIS — I69298 Other sequelae of other nontraumatic intracranial hemorrhage: Secondary | ICD-10-CM | POA: Diagnosis not present

## 2020-03-08 DIAGNOSIS — M810 Age-related osteoporosis without current pathological fracture: Secondary | ICD-10-CM | POA: Diagnosis not present

## 2020-03-08 DIAGNOSIS — R1310 Dysphagia, unspecified: Secondary | ICD-10-CM | POA: Diagnosis not present

## 2020-03-08 DIAGNOSIS — K219 Gastro-esophageal reflux disease without esophagitis: Secondary | ICD-10-CM | POA: Diagnosis not present

## 2020-03-08 DIAGNOSIS — I69293 Ataxia following other nontraumatic intracranial hemorrhage: Secondary | ICD-10-CM | POA: Diagnosis not present

## 2020-03-10 DIAGNOSIS — H8190 Unspecified disorder of vestibular function, unspecified ear: Secondary | ICD-10-CM | POA: Diagnosis not present

## 2020-03-10 DIAGNOSIS — M810 Age-related osteoporosis without current pathological fracture: Secondary | ICD-10-CM | POA: Diagnosis not present

## 2020-03-10 DIAGNOSIS — R1310 Dysphagia, unspecified: Secondary | ICD-10-CM | POA: Diagnosis not present

## 2020-03-10 DIAGNOSIS — K219 Gastro-esophageal reflux disease without esophagitis: Secondary | ICD-10-CM | POA: Diagnosis not present

## 2020-03-10 DIAGNOSIS — I69293 Ataxia following other nontraumatic intracranial hemorrhage: Secondary | ICD-10-CM | POA: Diagnosis not present

## 2020-03-10 DIAGNOSIS — I10 Essential (primary) hypertension: Secondary | ICD-10-CM | POA: Diagnosis not present

## 2020-03-10 DIAGNOSIS — I69298 Other sequelae of other nontraumatic intracranial hemorrhage: Secondary | ICD-10-CM | POA: Diagnosis not present

## 2020-03-10 DIAGNOSIS — I69391 Dysphagia following cerebral infarction: Secondary | ICD-10-CM | POA: Diagnosis not present

## 2020-03-10 DIAGNOSIS — G40909 Epilepsy, unspecified, not intractable, without status epilepticus: Secondary | ICD-10-CM | POA: Diagnosis not present

## 2020-03-14 ENCOUNTER — Encounter: Payer: Medicare Other | Admitting: Internal Medicine

## 2020-03-14 DIAGNOSIS — G40909 Epilepsy, unspecified, not intractable, without status epilepticus: Secondary | ICD-10-CM | POA: Diagnosis not present

## 2020-03-14 DIAGNOSIS — I619 Nontraumatic intracerebral hemorrhage, unspecified: Secondary | ICD-10-CM | POA: Diagnosis not present

## 2020-03-14 DIAGNOSIS — I10 Essential (primary) hypertension: Secondary | ICD-10-CM | POA: Diagnosis not present

## 2020-03-14 DIAGNOSIS — I69293 Ataxia following other nontraumatic intracranial hemorrhage: Secondary | ICD-10-CM | POA: Diagnosis not present

## 2020-03-15 DIAGNOSIS — I69298 Other sequelae of other nontraumatic intracranial hemorrhage: Secondary | ICD-10-CM | POA: Diagnosis not present

## 2020-03-15 DIAGNOSIS — K219 Gastro-esophageal reflux disease without esophagitis: Secondary | ICD-10-CM | POA: Diagnosis not present

## 2020-03-15 DIAGNOSIS — I69293 Ataxia following other nontraumatic intracranial hemorrhage: Secondary | ICD-10-CM | POA: Diagnosis not present

## 2020-03-15 DIAGNOSIS — H8190 Unspecified disorder of vestibular function, unspecified ear: Secondary | ICD-10-CM | POA: Diagnosis not present

## 2020-03-15 DIAGNOSIS — I10 Essential (primary) hypertension: Secondary | ICD-10-CM | POA: Diagnosis not present

## 2020-03-15 DIAGNOSIS — M810 Age-related osteoporosis without current pathological fracture: Secondary | ICD-10-CM | POA: Diagnosis not present

## 2020-03-15 DIAGNOSIS — I69391 Dysphagia following cerebral infarction: Secondary | ICD-10-CM | POA: Diagnosis not present

## 2020-03-15 DIAGNOSIS — G40909 Epilepsy, unspecified, not intractable, without status epilepticus: Secondary | ICD-10-CM | POA: Diagnosis not present

## 2020-03-15 DIAGNOSIS — R1310 Dysphagia, unspecified: Secondary | ICD-10-CM | POA: Diagnosis not present

## 2020-03-22 ENCOUNTER — Encounter: Payer: Self-pay | Admitting: Internal Medicine

## 2020-03-22 ENCOUNTER — Ambulatory Visit (INDEPENDENT_AMBULATORY_CARE_PROVIDER_SITE_OTHER): Payer: Medicare PPO | Admitting: Internal Medicine

## 2020-03-22 ENCOUNTER — Other Ambulatory Visit: Payer: Self-pay

## 2020-03-22 VITALS — BP 118/74 | HR 68 | Temp 97.4°F | Ht 62.0 in | Wt 114.0 lb

## 2020-03-22 DIAGNOSIS — I69298 Other sequelae of other nontraumatic intracranial hemorrhage: Secondary | ICD-10-CM | POA: Diagnosis not present

## 2020-03-22 DIAGNOSIS — Z7189 Other specified counseling: Secondary | ICD-10-CM | POA: Diagnosis not present

## 2020-03-22 DIAGNOSIS — G40909 Epilepsy, unspecified, not intractable, without status epilepticus: Secondary | ICD-10-CM | POA: Diagnosis not present

## 2020-03-22 DIAGNOSIS — K219 Gastro-esophageal reflux disease without esophagitis: Secondary | ICD-10-CM

## 2020-03-22 DIAGNOSIS — H8190 Unspecified disorder of vestibular function, unspecified ear: Secondary | ICD-10-CM | POA: Diagnosis not present

## 2020-03-22 DIAGNOSIS — Z0000 Encounter for general adult medical examination without abnormal findings: Secondary | ICD-10-CM

## 2020-03-22 DIAGNOSIS — I69391 Dysphagia following cerebral infarction: Secondary | ICD-10-CM | POA: Diagnosis not present

## 2020-03-22 DIAGNOSIS — I10 Essential (primary) hypertension: Secondary | ICD-10-CM

## 2020-03-22 DIAGNOSIS — M810 Age-related osteoporosis without current pathological fracture: Secondary | ICD-10-CM | POA: Diagnosis not present

## 2020-03-22 DIAGNOSIS — Z23 Encounter for immunization: Secondary | ICD-10-CM | POA: Diagnosis not present

## 2020-03-22 DIAGNOSIS — Z0189 Encounter for other specified special examinations: Secondary | ICD-10-CM | POA: Diagnosis not present

## 2020-03-22 DIAGNOSIS — R1310 Dysphagia, unspecified: Secondary | ICD-10-CM | POA: Diagnosis not present

## 2020-03-22 DIAGNOSIS — I69293 Ataxia following other nontraumatic intracranial hemorrhage: Secondary | ICD-10-CM | POA: Diagnosis not present

## 2020-03-22 MED ORDER — AMLODIPINE BESYLATE 2.5 MG PO TABS: 7.5000 mg | ORAL_TABLET | Freq: Every day | ORAL | 3 refills | Status: DC

## 2020-03-22 NOTE — Assessment & Plan Note (Signed)
See social history 

## 2020-03-22 NOTE — Addendum Note (Signed)
Addended by: Pilar Grammes on: 03/22/2020 04:02 PM   Modules accepted: Orders

## 2020-03-22 NOTE — Assessment & Plan Note (Signed)
depakote level high when increased Back to 500 bid Will recheck level

## 2020-03-22 NOTE — Assessment & Plan Note (Signed)
BP Readings from Last 3 Encounters:  03/22/20 118/74  02/29/20 131/78  12/28/19 136/82   Okay on amlodipine and metoprolol

## 2020-03-22 NOTE — Assessment & Plan Note (Signed)
Quiet on omeprazole Discussed trying to hold doses 1-2 times per week

## 2020-03-22 NOTE — Assessment & Plan Note (Signed)
I have personally reviewed the Medicare Annual Wellness questionnaire and have noted 1. The patient's medical and social history 2. Their use of alcohol, tobacco or illicit drugs 3. Their current medications and supplements 4. The patient's functional ability including ADL's, fall risks, home safety risks and hearing or visual             impairment. 5. Diet and physical activities 6. Evidence for depression or mood disorders  The patients weight, height, BMI and visual acuity have been recorded in the chart I have made referrals, counseling and provided education to the patient based review of the above and I have provided the pt with a written personalized care plan for preventive services.  I have provided you with a copy of your personalized plan for preventive services. Please take the time to review along with your updated medication list.  Had COVID booster Flu vaccine today One last mammogram--then done No other cancer screening Working on strength

## 2020-03-22 NOTE — Progress Notes (Signed)
Subjective:    Patient ID: Tanya Knight, female    DOB: 07-19-1940, 79 y.o.   MRN: [id number]  HPI Here for Medicare wellness visit and follow up of chronic health conditions With DIL Jeani Hawking This visit occurred during the SARS-CoV-2 public health emergency.  Safety protocols were in place, including screening questions prior to the visit, additional usage of staff PPE, and extensive cleaning of exam room while observing appropriate contact time as indicated for disinfecting solutions.   Reviewed advanced directives Reviewed other doctors----Dr Whitsett--dentist, Dr Morrison Old, Dr Sethi--neurology, Ms Zdeb--NP neurosurgery Was hospitalized in July for unexplained fall, fall with SDH in February. No surgery in past year No alcohol or tobacco Has had fall with injury Not depressed or anhedonic Memory seems to be okay  Did see Dr Leonie Man not long ago Valproic acid was too high---dose cut now No apparent seizures  Husband does more of the housework in general--but she helps Walks with 2 wheeled walker Does her own ADLs  Continues on 2 BP meds She checks occasionally and the PT is checking Usually 116/70's or so Occasional headache---tylenol helps No chest pain or SOB No palpitations No dizziness or syncope  Current Outpatient Medications on File Prior to Visit  Medication Sig Dispense Refill  . divalproex (DEPAKOTE) 250 MG DR tablet Take 2 tablets (500 mg total) by mouth 2 (two) times daily. 1 tablet 0  . metoprolol succinate (TOPROL-XL) 50 MG 24 hr tablet Take 1 tablet (50 mg total) by mouth daily. Take with or immediately following a meal. (Patient taking differently: Take 50 mg by mouth every evening. Take with or immediately following a meal.) 90 tablet 3  . Multiple Vitamin (MULTIVITAMIN) capsule Take 1 capsule by mouth daily.      Marland Kitchen omeprazole (PRILOSEC) 20 MG capsule Take 1 capsule (20 mg total) by mouth daily. 90 capsule 3   No current facility-administered  medications on file prior to visit.    Allergies  Allergen Reactions  . Phenytoin     Other reaction(s): Other (See Comments) Other Reaction: Other reaction REACTION: severe reaction  . Septra [Sulfamethoxazole-Trimethoprim]     Nausea, trouble eating and drinking    Past Medical History:  Diagnosis Date  . Fracture of metatarsal    Repair of fractured R metatarsal --Dr Duda---4/08  . GERD (gastroesophageal reflux disease)   . Hypertension   . Melanoma in situ [facility]) 7/17   Dr Kellie Moor  . Osteoporosis   . Seizure disorder ([place])   . Seizures ([place])   . Stroke ([place]) 06/2019  . Vitamin D deficiency     Past Surgical History:  Procedure Laterality Date  . CATARACT EXTRACTION, BILATERAL Bilateral 2014  . EYE SURGERY     Obstructed tear duct  . FOOT SURGERY  2008  . MELANOMA EXCISION Left 11/2015   left lower leg  . TEAR DUCT PROBING  10/12   temporary stent  . TONSILLECTOMY AND ADENOIDECTOMY  1948    Family History  Problem Relation Age of Onset  . Hypertension Mother   . Heart disease Father   . Breast cancer Daughter 57  . Diabetes Maternal Aunt   . Coronary artery disease Paternal Aunt   . Breast cancer Paternal Aunt   . Coronary artery disease Paternal Uncle   . Heart disease Maternal Grandmother   . Heart disease Maternal Grandfather   . Heart disease Paternal Grandmother   . Heart disease Paternal Grandfather   . Colon cancer Neg Hx   .  Stomach cancer Neg Hx   . Pancreatic cancer Neg Hx   . Rectal cancer Neg Hx     Social History   Socioeconomic History  . Marital status: Married    Spouse name: Pilar Plate  . Number of children: 3  . Years of education: 12  . Highest education level: Not on file  Occupational History  . Occupation: retired Financial planner: retired  Tobacco Use  . Smoking status: Never Smoker  . Smokeless tobacco: Never Used  Substance and Sexual Activity  . Alcohol use: Yes    Alcohol/week: 0.0 standard drinks     Comment: occasional  . Drug use: No  . Sexual activity: Yes    Birth control/protection: Post-menopausal  Other Topics Concern  . Not on file  Social History Narrative   09/14/19 lives at home with husband   Regular exercise-yes---aerobics, Pilates, jogged in past (now walks)      Has living will   Husband, then one of her children, has health care POA   Would accept resuscitation but no prolonged artificial ventilation   Probably would not want tube feeds if cognitively unaware      Right Handed   Drinks 3-4 cups caffeine daily   Social Determinants of Health   Financial Resource Strain:   . Difficulty of Paying Living Expenses: Not on file  Food Insecurity:   . Worried About Charity fundraiser in the Last Year: Not on file  . Ran Out of Food in the Last Year: Not on file  Transportation Needs:   . Lack of Transportation (Medical): Not on file  . Lack of Transportation (Non-Medical): Not on file  Physical Activity:   . Days of Exercise per Week: Not on file  . Minutes of Exercise per Session: Not on file  Stress:   . Feeling of Stress : Not on file  Social Connections:   . Frequency of Communication with Friends and Family: Not on file  . Frequency of Social Gatherings with Friends and Family: Not on file  . Attends Religious Services: Not on file  . Active Member of Clubs or Organizations: Not on file  . Attends Archivist Meetings: Not on file  . Marital Status: Not on file  Intimate Partner Violence:   . Fear of Current or Ex-Partner: Not on file  . Emotionally Abused: Not on file  . Physically Abused: Not on file  . Sexually Abused: Not on file   Review of Systems Sleeps well Appetite is okay Weight is stable Not driving --wears seat belt Teeth okay--due now for dentist No heartburn on prilosec. No dysphagia Bowels are moving fine. No blood Some back pain--no meds. No other sig joint pains    Objective:   Physical Exam Constitutional:       Appearance: Normal appearance.  HENT:     Mouth/Throat:     Comments: No lesions Eyes:     Conjunctiva/sclera: Conjunctivae normal.     Pupils: Pupils are equal, round, and reactive to light.  Cardiovascular:     Rate and Rhythm: Normal rate and regular rhythm.     Pulses: Normal pulses.     Heart sounds: No murmur heard.  No gallop.      Comments: Feet cool but faint pulses Pulmonary:     Effort: Pulmonary effort is normal.     Breath sounds: Normal breath sounds. No wheezing or rales.  Abdominal:     Palpations: Abdomen is soft.  Tenderness: There is no abdominal tenderness.  Musculoskeletal:     Cervical back: Neck supple.     Right lower leg: No edema.     Left lower leg: No edema.  Lymphadenopathy:     Cervical: No cervical adenopathy.  Skin:    Findings: No rash.  Neurological:     Mental Status: She is alert and oriented to person, place, and time.     Comments: President---"Biden, Trump, Obama" [id number] Recall 3/3  Psychiatric:        Mood and Affect: Mood normal.        Behavior: Behavior normal.            Assessment & Plan:

## 2020-03-22 NOTE — Progress Notes (Signed)
Hearing Screening   125Hz  250Hz  500Hz  1000Hz  2000Hz  3000Hz  4000Hz  6000Hz  8000Hz   Right ear:   40 0 40  0    Left ear:   40 0 40  0    Vision Screening Comments: May 2021

## 2020-03-23 LAB — VALPROIC ACID LEVEL: Valproic Acid Lvl: 89.4 mg/L (ref 50.0–100.0)

## 2020-03-23 NOTE — Progress Notes (Signed)
Thanks for letting me know!

## 2020-03-29 DIAGNOSIS — G40909 Epilepsy, unspecified, not intractable, without status epilepticus: Secondary | ICD-10-CM | POA: Diagnosis not present

## 2020-03-29 DIAGNOSIS — I69298 Other sequelae of other nontraumatic intracranial hemorrhage: Secondary | ICD-10-CM | POA: Diagnosis not present

## 2020-03-29 DIAGNOSIS — I69293 Ataxia following other nontraumatic intracranial hemorrhage: Secondary | ICD-10-CM | POA: Diagnosis not present

## 2020-03-29 DIAGNOSIS — M810 Age-related osteoporosis without current pathological fracture: Secondary | ICD-10-CM | POA: Diagnosis not present

## 2020-03-29 DIAGNOSIS — I10 Essential (primary) hypertension: Secondary | ICD-10-CM | POA: Diagnosis not present

## 2020-03-29 DIAGNOSIS — R1310 Dysphagia, unspecified: Secondary | ICD-10-CM | POA: Diagnosis not present

## 2020-03-29 DIAGNOSIS — H8190 Unspecified disorder of vestibular function, unspecified ear: Secondary | ICD-10-CM | POA: Diagnosis not present

## 2020-03-29 DIAGNOSIS — I69391 Dysphagia following cerebral infarction: Secondary | ICD-10-CM | POA: Diagnosis not present

## 2020-03-29 DIAGNOSIS — K219 Gastro-esophageal reflux disease without esophagitis: Secondary | ICD-10-CM | POA: Diagnosis not present

## 2020-04-02 DIAGNOSIS — M810 Age-related osteoporosis without current pathological fracture: Secondary | ICD-10-CM | POA: Diagnosis not present

## 2020-04-02 DIAGNOSIS — K219 Gastro-esophageal reflux disease without esophagitis: Secondary | ICD-10-CM | POA: Diagnosis not present

## 2020-04-02 DIAGNOSIS — H8190 Unspecified disorder of vestibular function, unspecified ear: Secondary | ICD-10-CM | POA: Diagnosis not present

## 2020-04-02 DIAGNOSIS — I69293 Ataxia following other nontraumatic intracranial hemorrhage: Secondary | ICD-10-CM | POA: Diagnosis not present

## 2020-04-02 DIAGNOSIS — G40909 Epilepsy, unspecified, not intractable, without status epilepticus: Secondary | ICD-10-CM | POA: Diagnosis not present

## 2020-04-02 DIAGNOSIS — I69391 Dysphagia following cerebral infarction: Secondary | ICD-10-CM | POA: Diagnosis not present

## 2020-04-02 DIAGNOSIS — R1310 Dysphagia, unspecified: Secondary | ICD-10-CM | POA: Diagnosis not present

## 2020-04-02 DIAGNOSIS — I10 Essential (primary) hypertension: Secondary | ICD-10-CM | POA: Diagnosis not present

## 2020-04-02 DIAGNOSIS — I69298 Other sequelae of other nontraumatic intracranial hemorrhage: Secondary | ICD-10-CM | POA: Diagnosis not present

## 2020-04-09 DIAGNOSIS — H8190 Unspecified disorder of vestibular function, unspecified ear: Secondary | ICD-10-CM | POA: Diagnosis not present

## 2020-04-09 DIAGNOSIS — I69293 Ataxia following other nontraumatic intracranial hemorrhage: Secondary | ICD-10-CM | POA: Diagnosis not present

## 2020-04-09 DIAGNOSIS — K219 Gastro-esophageal reflux disease without esophagitis: Secondary | ICD-10-CM | POA: Diagnosis not present

## 2020-04-09 DIAGNOSIS — I10 Essential (primary) hypertension: Secondary | ICD-10-CM | POA: Diagnosis not present

## 2020-04-09 DIAGNOSIS — R1310 Dysphagia, unspecified: Secondary | ICD-10-CM | POA: Diagnosis not present

## 2020-04-09 DIAGNOSIS — G40909 Epilepsy, unspecified, not intractable, without status epilepticus: Secondary | ICD-10-CM | POA: Diagnosis not present

## 2020-04-09 DIAGNOSIS — I69391 Dysphagia following cerebral infarction: Secondary | ICD-10-CM | POA: Diagnosis not present

## 2020-04-09 DIAGNOSIS — M810 Age-related osteoporosis without current pathological fracture: Secondary | ICD-10-CM | POA: Diagnosis not present

## 2020-04-09 DIAGNOSIS — I69298 Other sequelae of other nontraumatic intracranial hemorrhage: Secondary | ICD-10-CM | POA: Diagnosis not present

## 2020-04-11 ENCOUNTER — Other Ambulatory Visit: Payer: Self-pay | Admitting: Internal Medicine

## 2020-04-11 DIAGNOSIS — Z1231 Encounter for screening mammogram for malignant neoplasm of breast: Secondary | ICD-10-CM

## 2020-04-11 DIAGNOSIS — K219 Gastro-esophageal reflux disease without esophagitis: Secondary | ICD-10-CM | POA: Diagnosis not present

## 2020-04-11 DIAGNOSIS — H8190 Unspecified disorder of vestibular function, unspecified ear: Secondary | ICD-10-CM | POA: Diagnosis not present

## 2020-04-11 DIAGNOSIS — I69391 Dysphagia following cerebral infarction: Secondary | ICD-10-CM | POA: Diagnosis not present

## 2020-04-11 DIAGNOSIS — G40909 Epilepsy, unspecified, not intractable, without status epilepticus: Secondary | ICD-10-CM | POA: Diagnosis not present

## 2020-04-11 DIAGNOSIS — Z85820 Personal history of malignant melanoma of skin: Secondary | ICD-10-CM

## 2020-04-11 DIAGNOSIS — R1310 Dysphagia, unspecified: Secondary | ICD-10-CM | POA: Diagnosis not present

## 2020-04-11 DIAGNOSIS — I10 Essential (primary) hypertension: Secondary | ICD-10-CM | POA: Diagnosis not present

## 2020-04-11 DIAGNOSIS — Z9181 History of falling: Secondary | ICD-10-CM

## 2020-04-11 DIAGNOSIS — E785 Hyperlipidemia, unspecified: Secondary | ICD-10-CM

## 2020-04-11 DIAGNOSIS — E559 Vitamin D deficiency, unspecified: Secondary | ICD-10-CM

## 2020-04-11 DIAGNOSIS — I69298 Other sequelae of other nontraumatic intracranial hemorrhage: Secondary | ICD-10-CM | POA: Diagnosis not present

## 2020-04-11 DIAGNOSIS — I69293 Ataxia following other nontraumatic intracranial hemorrhage: Secondary | ICD-10-CM | POA: Diagnosis not present

## 2020-04-11 DIAGNOSIS — M810 Age-related osteoporosis without current pathological fracture: Secondary | ICD-10-CM | POA: Diagnosis not present

## 2020-04-11 DIAGNOSIS — E876 Hypokalemia: Secondary | ICD-10-CM

## 2020-04-11 DIAGNOSIS — H532 Diplopia: Secondary | ICD-10-CM

## 2020-04-12 DIAGNOSIS — G40909 Epilepsy, unspecified, not intractable, without status epilepticus: Secondary | ICD-10-CM | POA: Diagnosis not present

## 2020-04-12 DIAGNOSIS — I10 Essential (primary) hypertension: Secondary | ICD-10-CM | POA: Diagnosis not present

## 2020-04-12 DIAGNOSIS — I69293 Ataxia following other nontraumatic intracranial hemorrhage: Secondary | ICD-10-CM | POA: Diagnosis not present

## 2020-04-12 DIAGNOSIS — M810 Age-related osteoporosis without current pathological fracture: Secondary | ICD-10-CM | POA: Diagnosis not present

## 2020-04-12 DIAGNOSIS — K219 Gastro-esophageal reflux disease without esophagitis: Secondary | ICD-10-CM | POA: Diagnosis not present

## 2020-04-12 DIAGNOSIS — I69391 Dysphagia following cerebral infarction: Secondary | ICD-10-CM | POA: Diagnosis not present

## 2020-04-12 DIAGNOSIS — I69298 Other sequelae of other nontraumatic intracranial hemorrhage: Secondary | ICD-10-CM | POA: Diagnosis not present

## 2020-04-12 DIAGNOSIS — H8190 Unspecified disorder of vestibular function, unspecified ear: Secondary | ICD-10-CM | POA: Diagnosis not present

## 2020-04-12 DIAGNOSIS — R1310 Dysphagia, unspecified: Secondary | ICD-10-CM | POA: Diagnosis not present

## 2020-04-13 DIAGNOSIS — I619 Nontraumatic intracerebral hemorrhage, unspecified: Secondary | ICD-10-CM | POA: Diagnosis not present

## 2020-04-13 DIAGNOSIS — I69293 Ataxia following other nontraumatic intracranial hemorrhage: Secondary | ICD-10-CM | POA: Diagnosis not present

## 2020-04-13 DIAGNOSIS — G40909 Epilepsy, unspecified, not intractable, without status epilepticus: Secondary | ICD-10-CM | POA: Diagnosis not present

## 2020-04-13 DIAGNOSIS — I10 Essential (primary) hypertension: Secondary | ICD-10-CM | POA: Diagnosis not present

## 2020-04-19 DIAGNOSIS — I10 Essential (primary) hypertension: Secondary | ICD-10-CM | POA: Diagnosis not present

## 2020-04-19 DIAGNOSIS — R1310 Dysphagia, unspecified: Secondary | ICD-10-CM | POA: Diagnosis not present

## 2020-04-19 DIAGNOSIS — I69391 Dysphagia following cerebral infarction: Secondary | ICD-10-CM | POA: Diagnosis not present

## 2020-04-19 DIAGNOSIS — G40909 Epilepsy, unspecified, not intractable, without status epilepticus: Secondary | ICD-10-CM | POA: Diagnosis not present

## 2020-04-19 DIAGNOSIS — M810 Age-related osteoporosis without current pathological fracture: Secondary | ICD-10-CM | POA: Diagnosis not present

## 2020-04-19 DIAGNOSIS — K219 Gastro-esophageal reflux disease without esophagitis: Secondary | ICD-10-CM | POA: Diagnosis not present

## 2020-04-19 DIAGNOSIS — H8190 Unspecified disorder of vestibular function, unspecified ear: Secondary | ICD-10-CM | POA: Diagnosis not present

## 2020-04-19 DIAGNOSIS — I69293 Ataxia following other nontraumatic intracranial hemorrhage: Secondary | ICD-10-CM | POA: Diagnosis not present

## 2020-04-19 DIAGNOSIS — I69298 Other sequelae of other nontraumatic intracranial hemorrhage: Secondary | ICD-10-CM | POA: Diagnosis not present

## 2020-04-25 ENCOUNTER — Ambulatory Visit
Admission: RE | Admit: 2020-04-25 | Discharge: 2020-04-25 | Disposition: A | Discharge status: Home / Self Care | Payer: Medicare PPO | Source: Ambulatory Visit | Attending: Internal Medicine | Admitting: Internal Medicine

## 2020-04-25 ENCOUNTER — Other Ambulatory Visit: Payer: Self-pay

## 2020-04-25 DIAGNOSIS — Z1231 Encounter for screening mammogram for malignant neoplasm of breast: Secondary | ICD-10-CM | POA: Diagnosis not present

## 2020-04-25 IMAGING — MG DIGITAL SCREENING BILAT W/ TOMO W/ CAD
6 of 10 series · 6 of 30 positions shown · non-contrast
Comparison: Previous exam(s).

CLINICAL DATA: Screening.

EXAM:
DIGITAL SCREENING BILATERAL MAMMOGRAM WITH TOMO AND CAD

[R MLO synth-2D]
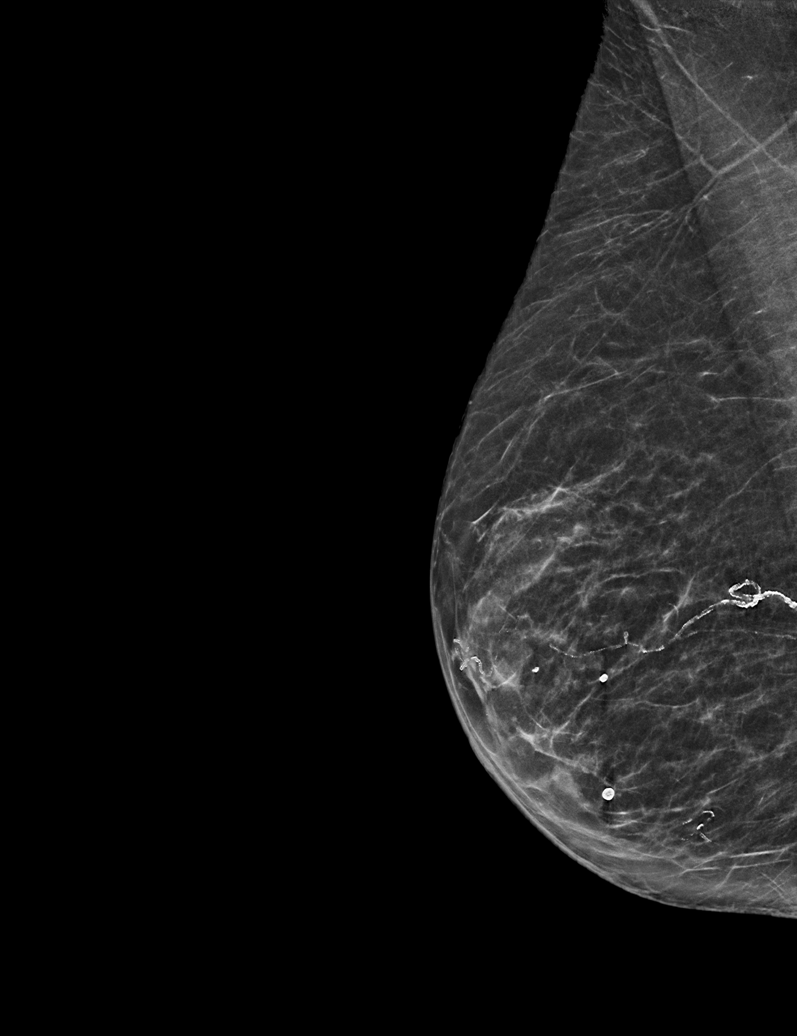

[L MLO synth-2D]
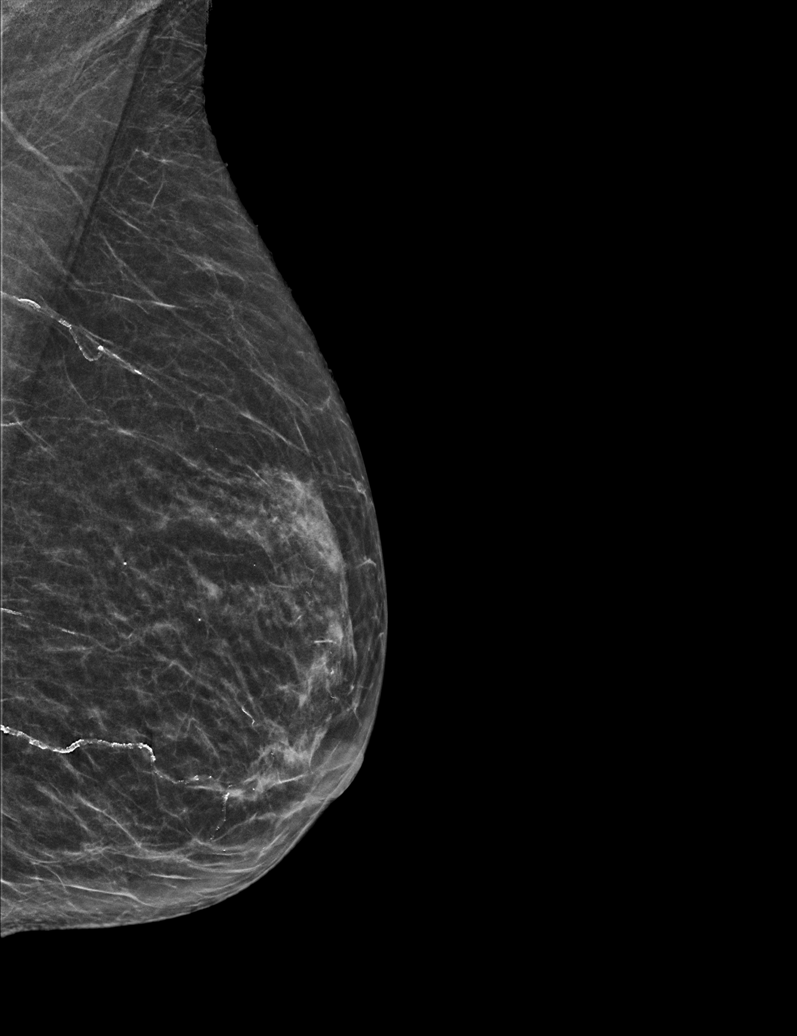

[R CC synth-2D]
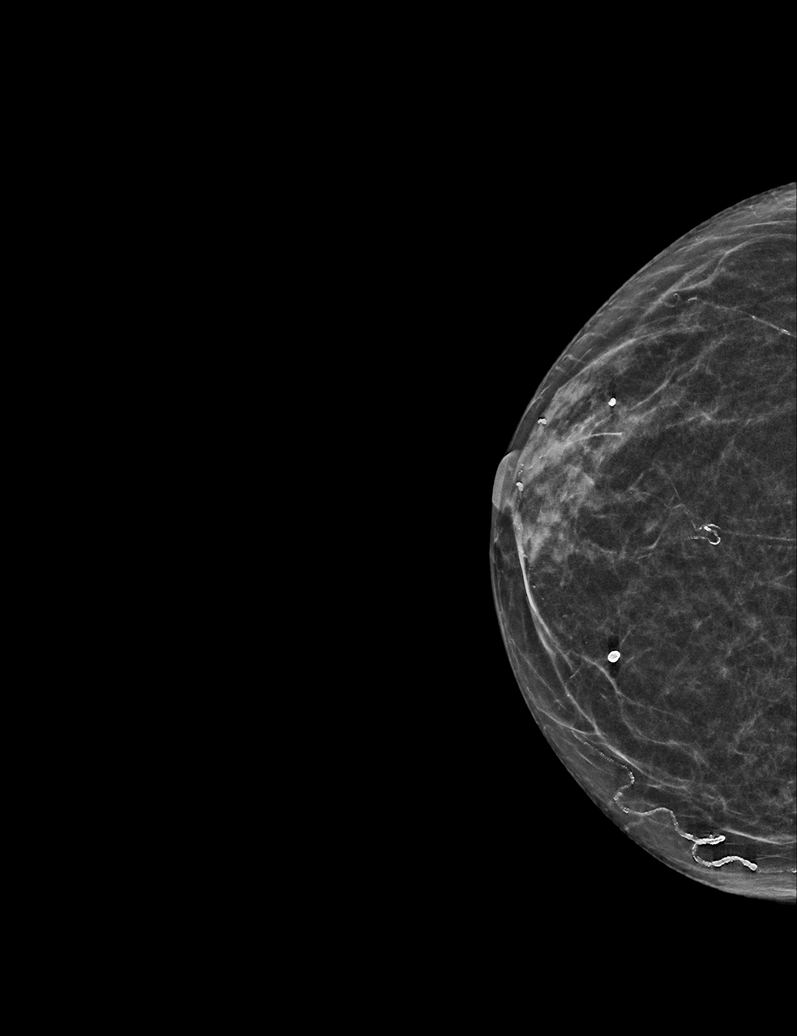

[R XCCL synth-2D]
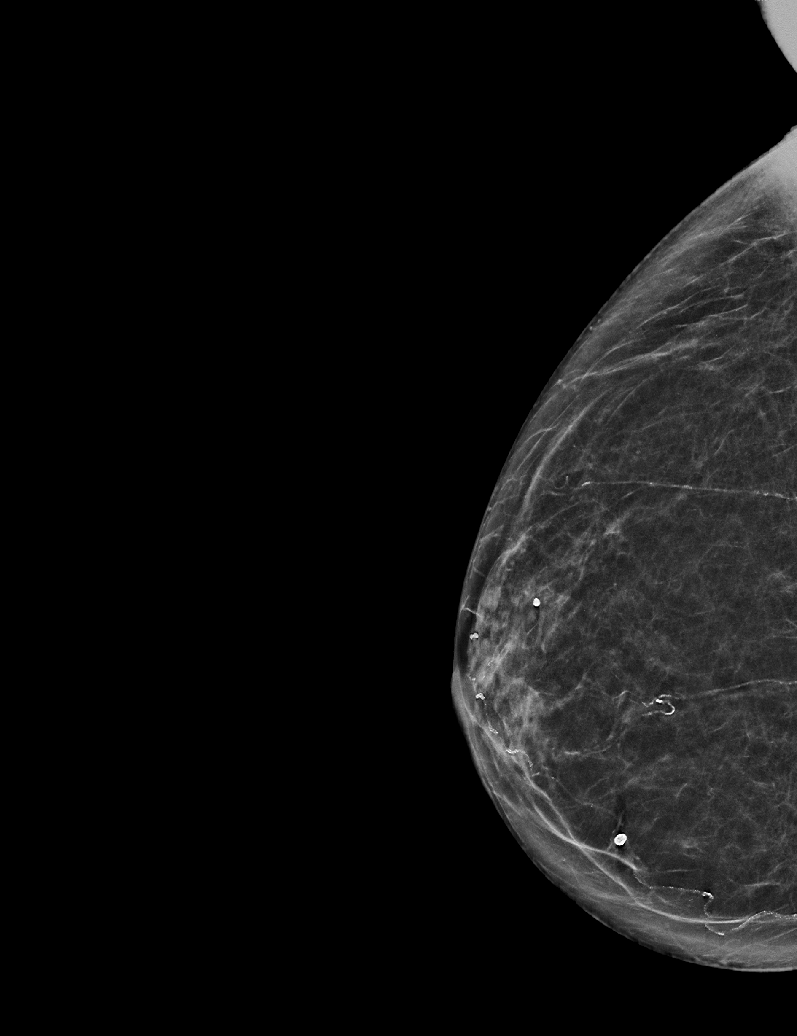

[L CC synth-2D]
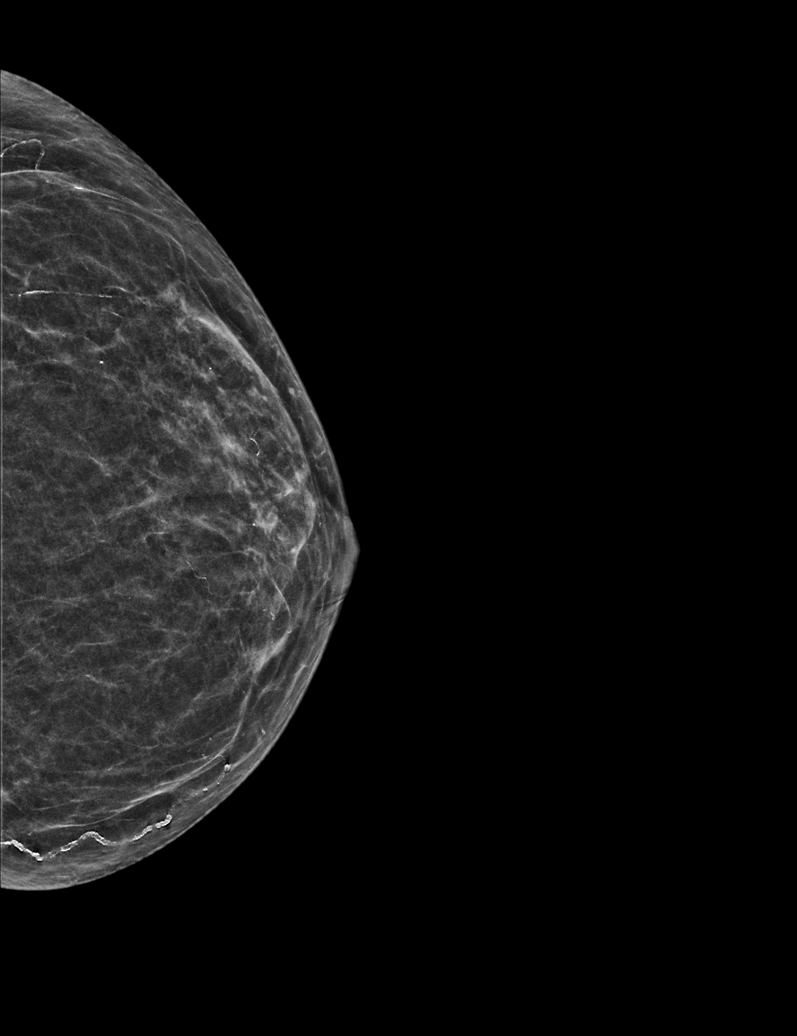

[R XCCL tomo · tomo slice 27/54.0]
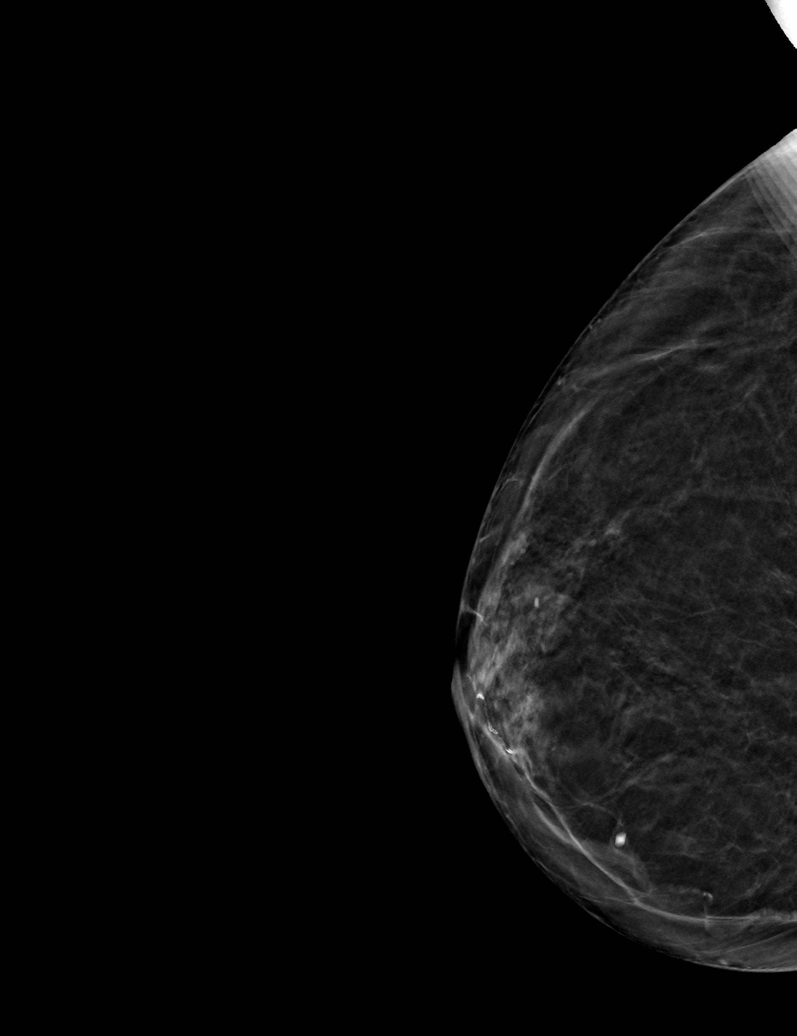

[6 of 30 positions shown; findings below may reference images not displayed]

ACR Breast Density Category b: There are scattered areas of
fibroglandular density.
FINDINGS: There are no findings suspicious for malignancy. Images were
processed with CAD.
IMPRESSION: No mammographic evidence of malignancy. A result letter of this
screening mammogram will be mailed directly to the patient.

RECOMMENDATION:
Screening mammogram in one year. (Code:[TQ])

BI-RADS CATEGORY  1: Negative.

## 2020-04-26 DIAGNOSIS — K219 Gastro-esophageal reflux disease without esophagitis: Secondary | ICD-10-CM | POA: Diagnosis not present

## 2020-04-26 DIAGNOSIS — I69391 Dysphagia following cerebral infarction: Secondary | ICD-10-CM | POA: Diagnosis not present

## 2020-04-26 DIAGNOSIS — I69298 Other sequelae of other nontraumatic intracranial hemorrhage: Secondary | ICD-10-CM | POA: Diagnosis not present

## 2020-04-26 DIAGNOSIS — I69293 Ataxia following other nontraumatic intracranial hemorrhage: Secondary | ICD-10-CM | POA: Diagnosis not present

## 2020-04-26 DIAGNOSIS — I10 Essential (primary) hypertension: Secondary | ICD-10-CM | POA: Diagnosis not present

## 2020-04-26 DIAGNOSIS — G40909 Epilepsy, unspecified, not intractable, without status epilepticus: Secondary | ICD-10-CM | POA: Diagnosis not present

## 2020-04-26 DIAGNOSIS — R1310 Dysphagia, unspecified: Secondary | ICD-10-CM | POA: Diagnosis not present

## 2020-04-26 DIAGNOSIS — H8190 Unspecified disorder of vestibular function, unspecified ear: Secondary | ICD-10-CM | POA: Diagnosis not present

## 2020-04-26 DIAGNOSIS — M810 Age-related osteoporosis without current pathological fracture: Secondary | ICD-10-CM | POA: Diagnosis not present

## 2020-05-03 DIAGNOSIS — I10 Essential (primary) hypertension: Secondary | ICD-10-CM | POA: Diagnosis not present

## 2020-05-03 DIAGNOSIS — R1310 Dysphagia, unspecified: Secondary | ICD-10-CM | POA: Diagnosis not present

## 2020-05-03 DIAGNOSIS — M810 Age-related osteoporosis without current pathological fracture: Secondary | ICD-10-CM | POA: Diagnosis not present

## 2020-05-03 DIAGNOSIS — I69298 Other sequelae of other nontraumatic intracranial hemorrhage: Secondary | ICD-10-CM | POA: Diagnosis not present

## 2020-05-03 DIAGNOSIS — I69391 Dysphagia following cerebral infarction: Secondary | ICD-10-CM | POA: Diagnosis not present

## 2020-05-03 DIAGNOSIS — H8190 Unspecified disorder of vestibular function, unspecified ear: Secondary | ICD-10-CM | POA: Diagnosis not present

## 2020-05-03 DIAGNOSIS — G40909 Epilepsy, unspecified, not intractable, without status epilepticus: Secondary | ICD-10-CM | POA: Diagnosis not present

## 2020-05-03 DIAGNOSIS — K219 Gastro-esophageal reflux disease without esophagitis: Secondary | ICD-10-CM | POA: Diagnosis not present

## 2020-05-03 DIAGNOSIS — I69293 Ataxia following other nontraumatic intracranial hemorrhage: Secondary | ICD-10-CM | POA: Diagnosis not present

## 2020-05-14 DIAGNOSIS — I69293 Ataxia following other nontraumatic intracranial hemorrhage: Secondary | ICD-10-CM | POA: Diagnosis not present

## 2020-05-14 DIAGNOSIS — I10 Essential (primary) hypertension: Secondary | ICD-10-CM | POA: Diagnosis not present

## 2020-05-14 DIAGNOSIS — I619 Nontraumatic intracerebral hemorrhage, unspecified: Secondary | ICD-10-CM | POA: Diagnosis not present

## 2020-05-14 DIAGNOSIS — G40909 Epilepsy, unspecified, not intractable, without status epilepticus: Secondary | ICD-10-CM | POA: Diagnosis not present

## 2020-06-14 DIAGNOSIS — G40909 Epilepsy, unspecified, not intractable, without status epilepticus: Secondary | ICD-10-CM | POA: Diagnosis not present

## 2020-06-14 DIAGNOSIS — I69293 Ataxia following other nontraumatic intracranial hemorrhage: Secondary | ICD-10-CM | POA: Diagnosis not present

## 2020-06-14 DIAGNOSIS — I619 Nontraumatic intracerebral hemorrhage, unspecified: Secondary | ICD-10-CM | POA: Diagnosis not present

## 2020-06-14 DIAGNOSIS — I10 Essential (primary) hypertension: Secondary | ICD-10-CM | POA: Diagnosis not present

## 2020-07-12 DIAGNOSIS — I619 Nontraumatic intracerebral hemorrhage, unspecified: Secondary | ICD-10-CM | POA: Diagnosis not present

## 2020-07-12 DIAGNOSIS — I69293 Ataxia following other nontraumatic intracranial hemorrhage: Secondary | ICD-10-CM | POA: Diagnosis not present

## 2020-07-12 DIAGNOSIS — G40909 Epilepsy, unspecified, not intractable, without status epilepticus: Secondary | ICD-10-CM | POA: Diagnosis not present

## 2020-07-12 DIAGNOSIS — I10 Essential (primary) hypertension: Secondary | ICD-10-CM | POA: Diagnosis not present

## 2020-08-22 DIAGNOSIS — M5136 Other intervertebral disc degeneration, lumbar region: Secondary | ICD-10-CM | POA: Diagnosis not present

## 2020-09-14 ENCOUNTER — Other Ambulatory Visit: Payer: Self-pay | Admitting: Internal Medicine

## 2020-10-18 DIAGNOSIS — M546 Pain in thoracic spine: Secondary | ICD-10-CM | POA: Diagnosis not present

## 2020-11-04 ENCOUNTER — Other Ambulatory Visit: Payer: Self-pay | Admitting: Internal Medicine

## 2020-11-08 DIAGNOSIS — M546 Pain in thoracic spine: Secondary | ICD-10-CM | POA: Diagnosis not present

## 2020-11-28 DIAGNOSIS — M546 Pain in thoracic spine: Secondary | ICD-10-CM | POA: Diagnosis not present

## 2020-12-10 ENCOUNTER — Other Ambulatory Visit: Payer: Self-pay | Admitting: Internal Medicine

## 2020-12-10 NOTE — Telephone Encounter (Signed)
Chart had her on Depakote 250 mg 2 tabs twice daily. The pharmacy sent a request for '500mg'$ . I called the pt to find out if she wanted to continue with 250 2 tabs bid or take the '500mg'$  1 bid. She wants to do the '500mg'$  bid. I have sent that in and reminded her that when she picks up this medication, to only take 1 twice daily.

## 2020-12-18 DIAGNOSIS — M546 Pain in thoracic spine: Secondary | ICD-10-CM | POA: Diagnosis not present

## 2020-12-20 DIAGNOSIS — M546 Pain in thoracic spine: Secondary | ICD-10-CM | POA: Diagnosis not present

## 2020-12-25 DIAGNOSIS — M546 Pain in thoracic spine: Secondary | ICD-10-CM | POA: Diagnosis not present

## 2020-12-28 DIAGNOSIS — M546 Pain in thoracic spine: Secondary | ICD-10-CM | POA: Diagnosis not present

## 2021-01-01 DIAGNOSIS — M546 Pain in thoracic spine: Secondary | ICD-10-CM | POA: Diagnosis not present

## 2021-01-04 ENCOUNTER — Telehealth: Payer: Self-pay | Admitting: *Deleted

## 2021-01-04 DIAGNOSIS — M546 Pain in thoracic spine: Secondary | ICD-10-CM | POA: Diagnosis not present

## 2021-01-04 NOTE — Telephone Encounter (Signed)
Spoke to pt. Made appt 01-10-21 at 2:15. She will go to the ER if it happens again prior to the appt.

## 2021-01-04 NOTE — Telephone Encounter (Signed)
Pt called stating she "passed out" and wanted to make sure PCP knew and it was documented. I xfered to access nurse for an official triage

## 2021-01-04 NOTE — Telephone Encounter (Signed)
PLEASE NOTE: All timestamps contained within this report are represented as [place] Standard Time. CONFIDENTIALTY NOTICE: This fax transmission is intended only for the addressee. It contains information that is legally privileged, confidential or otherwise protected from use or disclosure. If you are not the intended recipient, you are strictly prohibited from reviewing, disclosing, copying using or disseminating any of this information or taking any action in reliance on or regarding this information. If you have received this fax in error, please notify us immediately by telephone so that we can arrange for its return to [place]. Phone: [phone], Toll-Free: [phone], Fax: [phone] Page: 1 of 2 Call Id: [id number] [place] [facility] - Client TELEPHONE ADVICE RECORD AccessNurse Patient Name: Tanya Knight [facility] Gender: Female DOB: May 25, 1940 Age: 80 Y 10 M 28 D Return Phone Number: [phone] (Primary) Address: City/ State/ Zip: [place]  [address] Client  Primary [facility] - Client Client Site [place] - Day Physician Viviana Simpler- MD Contact Type Call Who Is Calling Patient / Member / Family / Caregiver Call Type Triage / Clinical Relationship To Patient Self Return Phone Number [phone] (Primary) Chief Complaint FAINTING or [place] Reason for Call Symptomatic / Request for Health Information Initial Comment Caller is Shapelle from [place], requesting patient be triaged, patient passed out. Translation No Nurse Assessment Nurse: Kathi Ludwig, RN, Leana Roe Date/Time Eilene Ghazi Time): 01/04/2021 11:36:08 AM Confirm and document reason for call. If symptomatic, describe symptoms. ---Caller states she passed out on Wed, was hot and was walking. happened once a month ago and was seen at that time. currently at PT. not lightheaded or dizzy today. hasn't passed out since [place]. no chest pain or  dyspnea Does the patient have any new or worsening symptoms? ---Yes Will a triage be completed? ---Yes Related visit to physician within the last 2 weeks? ---No Does the PT have any chronic conditions? (i.e. diabetes, asthma, this includes High risk factors for pregnancy, etc.) ---Yes List chronic conditions. ---HTN Is this a behavioral health or substance abuse call? ---No Guidelines Guideline Title Affirmed Question Affirmed Notes Nurse Date/Time Eilene Ghazi Time) Fainting Occurred during exercise Kathi Ludwig, RN, Tracie 01/04/2021 11:37:49 AM Disp. Time Eilene Ghazi Time) Disposition Final User 01/04/2021 11:34:25 AM Send to Urgent Queue Iver Nestle 01/04/2021 11:41:26 AM Go to ED Now Yes Kathi Ludwig, RN, Leana Roe PLEASE NOTE: All timestamps contained within this report are represented as [place] Standard Time. CONFIDENTIALTY NOTICE: This fax transmission is intended only for the addressee. It contains information that is legally privileged, confidential or otherwise protected from use or disclosure. If you are not the intended recipient, you are strictly prohibited from reviewing, disclosing, copying using or disseminating any of this information or taking any action in reliance on or regarding this information. If you have received this fax in error, please notify us immediately by telephone so that we can arrange for its return to [place]. Phone: [phone], Toll-Free: [phone], Fax: [phone] Page: 2 of 2 Call Id: [id number] [place] Disagree/Comply Disagree Caller Understands Yes PreDisposition Call Doctor Care Advice Given Per Guideline GO TO ED NOW: * You need to be seen in the Emergency Department. * Go to the ED at ___________ [place] now. Drive carefully. ANOTHER ADULT SHOULD DRIVE: * It is better and safer if another adult drives instead of you. BRING MEDICINES: * Bring a list of your current medicines when you go to the Emergency Department (ER). CARE ADVICE given  per Fainting (Adult) guideline. CALL EMS 911  IF: * Difficulty breathing or confusion occurs * Faints again * Too dizzy or weak to stand * You become worse Comments User: Estevan Ryder, RN Date/Time Eilene Ghazi Time): 01/04/2021 11:38:29 AM correction: states passed out July last year, not last month User: Estevan Ryder, RN Date/Time Eilene Ghazi Time): 01/04/2021 11:39:48 AM uses walker, states was very hot at the time User: Estevan Ryder, RN Date/Time (Eastern Time): 01/04/2021 11:44:16 AM states she did fine during PT today, will not be going to ER Referrals GO TO FACILITY REFUSED

## 2021-01-08 DIAGNOSIS — M546 Pain in thoracic spine: Secondary | ICD-10-CM | POA: Diagnosis not present

## 2021-01-10 ENCOUNTER — Ambulatory Visit: Payer: Medicare PPO | Admitting: Internal Medicine

## 2021-01-10 DIAGNOSIS — M546 Pain in thoracic spine: Secondary | ICD-10-CM | POA: Diagnosis not present

## 2021-01-11 ENCOUNTER — Encounter: Payer: Self-pay | Admitting: Internal Medicine

## 2021-01-11 ENCOUNTER — Other Ambulatory Visit: Payer: Self-pay

## 2021-01-11 ENCOUNTER — Ambulatory Visit: Payer: Medicare PPO | Admitting: Internal Medicine

## 2021-01-11 VITALS — BP 138/76 | HR 63 | Temp 97.8°F | Ht 62.0 in | Wt 114.0 lb

## 2021-01-11 DIAGNOSIS — R55 Syncope and collapse: Secondary | ICD-10-CM

## 2021-01-11 DIAGNOSIS — E441 Mild protein-calorie malnutrition: Secondary | ICD-10-CM

## 2021-01-11 DIAGNOSIS — R569 Unspecified convulsions: Secondary | ICD-10-CM

## 2021-01-11 LAB — COMPREHENSIVE METABOLIC PANEL
ALT: 5 U/L (ref 0–35)
AST: 12 U/L (ref 0–37)
Albumin: 4 g/dL (ref 3.5–5.2)
Alkaline Phosphatase: 74 U/L (ref 39–117)
BUN: 15 mg/dL (ref 6–23)
CO2: 30 mEq/L (ref 19–32)
Calcium: 10.1 mg/dL (ref 8.4–10.5)
Chloride: 101 mEq/L (ref 96–112)
Creatinine, Ser: 0.53 mg/dL (ref 0.40–1.20)
GFR: 87.65 mL/min (ref >60.00)
Glucose, Bld: 89 mg/dL (ref 70–99)
Potassium: 5.2 mEq/L — ABNORMAL HIGH (ref 3.5–5.1)
Sodium: 140 mEq/L (ref 135–145)
Total Bilirubin: 0.4 mg/dL (ref 0.2–1.2)
Total Protein: 6.6 g/dL (ref 6.0–8.3)

## 2021-01-11 LAB — VITAMIN B12: Vitamin B-12: 484 pg/mL (ref 211–911)

## 2021-01-11 LAB — CBC
HCT: 40.8 % (ref 36.0–46.0)
Hemoglobin: 13.6 g/dL (ref 12.0–15.0)
MCHC: 33.3 g/dL (ref 30.0–36.0)
MCV: 96.8 fl (ref 78.0–100.0)
Platelets: 258 10*3/uL (ref 150.0–400.0)
RBC: 4.21 Mil/uL (ref 3.87–5.11)
RDW: 14.4 % (ref 11.5–15.5)
WBC: 5.9 10*3/uL (ref 4.0–10.5)

## 2021-01-11 LAB — T4, FREE: Free T4: 0.87 ng/dL (ref 0.60–1.60)

## 2021-01-11 NOTE — Assessment & Plan Note (Signed)
Nothing to suggest seizure with this episode Will just check labs

## 2021-01-11 NOTE — Progress Notes (Signed)
Subjective:    Patient ID: Tanya Knight, female    DOB: 03-27-1941, 80 y.o.   MRN: [id number]  HPI Here with Lynn---DIL---due to fainting spell This visit occurred during the SARS-CoV-2 public health emergency.  Safety protocols were in place, including screening questions prior to the visit, additional usage of staff PPE, and extensive cleaning of exam room while observing appropriate contact time as indicated for disinfecting solutions.   Was walking with walker at [place] last week--3PM Very hot and humid Out for 5-10 minutes----she felt like she had to get to car (so hot and uncomfortable) No lightheaded feeling All of a sudden----she noticed she was on the ground DIL noted--they reached the car--walker out too far in front of her Spoke to her---but she didn't seem to hear Right hand started shaking--then started moving forward but DIL got her and she "just sank" to the ground on her bottom Within a couple of seconds--she spoke to DIL Got up quickly--but then sat back down again Seemed to look at DIL with some confusion  Then sat in car----she was able to position herself into her seat Drank some water and sat in air conditioning and soon felt back to normal Able to walk with walker and go up her 3 steps to get into the house Pretty much back to normal  Took it easy for 1-2 days Then went back to PT--and they felt she should get checked  Current Outpatient Medications on File Prior to Visit  Medication Sig Dispense Refill   amLODipine (NORVASC) 2.5 MG tablet Take 3 tablets (7.5 mg total) by mouth daily. 270 tablet 3   divalproex (DEPAKOTE) 500 MG DR tablet Take 1 tablet (500 mg total) by mouth 2 (two) times daily. 180 tablet 3   metoprolol succinate (TOPROL-XL) 50 MG 24 hr tablet TAKE 1 TABLET(50 MG) BY MOUTH DAILY WITH OR IMMEDIATELY FOLLOWING A MEAL 90 tablet 3   Multiple Vitamin (MULTIVITAMIN) capsule Take 1 capsule by mouth daily.       omeprazole (PRILOSEC) 20 MG  capsule TAKE 1 CAPSULE(20 MG) BY MOUTH DAILY 90 capsule 3   No current facility-administered medications on file prior to visit.    Allergies  Allergen Reactions   Phenytoin     Other reaction(s): Other (See Comments) Other Reaction: Other reaction REACTION: severe reaction   Septra [Sulfamethoxazole-Trimethoprim]     Nausea, trouble eating and drinking    Past Medical History:  Diagnosis Date   Fracture of metatarsal    Repair of fractured R metatarsal --Dr Duda---4/08   GERD (gastroesophageal reflux disease)    Hypertension    Melanoma in situ ([place]) 7/17   Dr Kellie Moor   Osteoporosis    Seizure disorder [facility])    Seizures ([place])    Stroke ([place]) 06/2019   Vitamin D deficiency     Past Surgical History:  Procedure Laterality Date   CATARACT EXTRACTION, BILATERAL Bilateral 2014   EYE SURGERY     Obstructed tear duct   FOOT SURGERY  2008   MELANOMA EXCISION Left 11/2015   left lower leg   TEAR DUCT PROBING  10/12   temporary stent   TONSILLECTOMY AND ADENOIDECTOMY  1948    Family History  Problem Relation Age of Onset   Hypertension Mother    Heart disease Father    Breast cancer Daughter 27   Diabetes Maternal Aunt    Coronary artery disease Paternal Aunt    Breast cancer Paternal Aunt    Coronary  artery disease Paternal Uncle    Heart disease Maternal Grandmother    Heart disease Maternal Grandfather    Heart disease Paternal Grandmother    Heart disease Paternal Grandfather    Colon cancer Neg Hx    Stomach cancer Neg Hx    Pancreatic cancer Neg Hx    Rectal cancer Neg Hx     Social History   Socioeconomic History   Marital status: Married    Spouse name: Pilar Plate   Number of children: 3   Years of education: 12   Highest education level: Not on file  Occupational History   Occupation: retired Financial planner: retired  Tobacco Use   Smoking status: Never   Smokeless tobacco: Never  Substance and Sexual Activity   Alcohol use: Yes     Alcohol/week: 0.0 standard drinks    Comment: occasional   Drug use: No   Sexual activity: Yes    Birth control/protection: Post-menopausal  Other Topics Concern   Not on file  Social History Narrative   09/14/19 lives at home with husband   Regular exercise-yes---aerobics, Pilates, jogged in past (now walks)      Has living will   Husband, then one of her children, has health care POA   Would accept resuscitation but no prolonged artificial ventilation   Probably would not want tube feeds if cognitively unaware      Right Handed   Drinks 3-4 cups caffeine daily   Social Determinants of Health   Financial Resource Strain: Not on file  Food Insecurity: Not on file  Transportation Needs: Not on file  Physical Activity: Not on file  Stress: Not on file  Social Connections: Not on file  Intimate Partner Violence: Not on file   Review of Systems Some constipation---that day of the spell Finally went 2 days later Eating okay Notes "trembly hands" at times in general    Objective:   Physical Exam Constitutional:      Appearance: Normal appearance.     Comments: In wheelchair to facilitate coming into office  Cardiovascular:     Rate and Rhythm: Normal rate and regular rhythm.     Heart sounds: No murmur heard.   No gallop.  Pulmonary:     Effort: Pulmonary effort is normal.     Breath sounds: Normal breath sounds. No wheezing or rales.  Abdominal:     Palpations: Abdomen is soft.     Tenderness: There is no abdominal tenderness.  Musculoskeletal:     Cervical back: Neck supple.     Right lower leg: No edema.     Left lower leg: No edema.  Lymphadenopathy:     Cervical: No cervical adenopathy.  Neurological:     General: No focal deficit present.     Mental Status: She is alert and oriented to person, place, and time.           Assessment & Plan:

## 2021-01-11 NOTE — Assessment & Plan Note (Signed)
Weight is stable at least This along with muscle weakness would certainly make her more susceptible to heat exhaustion

## 2021-01-11 NOTE — Assessment & Plan Note (Signed)
Had spell in the heat 9 days ago Mild shaky hand--but nothing to suggest seizure No symptoms since then--only came in at insistence of the PT Normal neuro exam Nothing to suggest arrhythmia Not dehydrated   Will just observe

## 2021-01-12 LAB — VALPROIC ACID LEVEL: Valproic Acid Lvl: 127.7 mg/L — ABNORMAL HIGH (ref 50.0–100.0)

## 2021-01-15 DIAGNOSIS — M546 Pain in thoracic spine: Secondary | ICD-10-CM | POA: Diagnosis not present

## 2021-01-17 DIAGNOSIS — M546 Pain in thoracic spine: Secondary | ICD-10-CM | POA: Diagnosis not present

## 2021-01-22 ENCOUNTER — Other Ambulatory Visit: Payer: Self-pay

## 2021-01-22 MED ORDER — DIVALPROEX SODIUM 250 MG PO DR TAB: 250.0000 mg | DELAYED_RELEASE_TABLET | Freq: Every day | ORAL | 3 refills | Status: DC

## 2021-02-15 DIAGNOSIS — M545 Low back pain, unspecified: Secondary | ICD-10-CM | POA: Diagnosis not present

## 2021-03-01 ENCOUNTER — Other Ambulatory Visit: Payer: Self-pay | Admitting: Orthopedic Surgery

## 2021-03-01 DIAGNOSIS — M545 Low back pain, unspecified: Secondary | ICD-10-CM

## 2021-03-13 ENCOUNTER — Ambulatory Visit
Admission: RE | Admit: 2021-03-13 | Discharge: 2021-03-13 | Disposition: A | Discharge status: Home / Self Care | Payer: Medicare PPO | Source: Ambulatory Visit | Attending: Orthopedic Surgery | Admitting: Orthopedic Surgery

## 2021-03-13 ENCOUNTER — Other Ambulatory Visit: Payer: Self-pay

## 2021-03-13 ENCOUNTER — Other Ambulatory Visit: Payer: Self-pay | Admitting: Internal Medicine

## 2021-03-13 DIAGNOSIS — Z780 Asymptomatic menopausal state: Secondary | ICD-10-CM | POA: Insufficient documentation

## 2021-03-13 DIAGNOSIS — M545 Low back pain, unspecified: Secondary | ICD-10-CM | POA: Diagnosis not present

## 2021-03-13 DIAGNOSIS — M810 Age-related osteoporosis without current pathological fracture: Secondary | ICD-10-CM | POA: Diagnosis not present

## 2021-03-13 DIAGNOSIS — Z13820 Encounter for screening for osteoporosis: Secondary | ICD-10-CM | POA: Diagnosis not present

## 2021-03-19 ENCOUNTER — Other Ambulatory Visit: Payer: Self-pay | Admitting: Internal Medicine

## 2021-03-19 DIAGNOSIS — Z1231 Encounter for screening mammogram for malignant neoplasm of breast: Secondary | ICD-10-CM

## 2021-03-20 DIAGNOSIS — M4856XA Collapsed vertebra, not elsewhere classified, lumbar region, initial encounter for fracture: Secondary | ICD-10-CM | POA: Diagnosis not present

## 2021-03-20 DIAGNOSIS — G894 Chronic pain syndrome: Secondary | ICD-10-CM | POA: Diagnosis not present

## 2021-03-20 DIAGNOSIS — M545 Low back pain, unspecified: Secondary | ICD-10-CM | POA: Diagnosis not present

## 2021-03-25 ENCOUNTER — Ambulatory Visit (INDEPENDENT_AMBULATORY_CARE_PROVIDER_SITE_OTHER): Payer: Medicare PPO | Admitting: Internal Medicine

## 2021-03-25 ENCOUNTER — Encounter: Payer: Self-pay | Admitting: Internal Medicine

## 2021-03-25 ENCOUNTER — Other Ambulatory Visit: Payer: Self-pay

## 2021-03-25 VITALS — BP 124/80 | HR 71 | Temp 97.6°F | Ht 62.0 in | Wt 111.0 lb

## 2021-03-25 DIAGNOSIS — M8000XA Age-related osteoporosis with current pathological fracture, unspecified site, initial encounter for fracture: Secondary | ICD-10-CM

## 2021-03-25 DIAGNOSIS — E441 Mild protein-calorie malnutrition: Secondary | ICD-10-CM | POA: Diagnosis not present

## 2021-03-25 DIAGNOSIS — G40909 Epilepsy, unspecified, not intractable, without status epilepticus: Secondary | ICD-10-CM | POA: Diagnosis not present

## 2021-03-25 DIAGNOSIS — I10 Essential (primary) hypertension: Secondary | ICD-10-CM

## 2021-03-25 DIAGNOSIS — S22000G Wedge compression fracture of unspecified thoracic vertebra, subsequent encounter for fracture with delayed healing: Secondary | ICD-10-CM

## 2021-03-25 DIAGNOSIS — Z0000 Encounter for general adult medical examination without abnormal findings: Secondary | ICD-10-CM | POA: Diagnosis not present

## 2021-03-25 DIAGNOSIS — S22000A Wedge compression fracture of unspecified thoracic vertebra, initial encounter for closed fracture: Secondary | ICD-10-CM

## 2021-03-25 MED ORDER — DIVALPROEX SODIUM 500 MG PO DR TAB
500.0000 mg | DELAYED_RELEASE_TABLET | Freq: Every day | ORAL | 0 refills | Status: DC
Start: 2021-03-25 — End: 2021-12-19

## 2021-03-25 MED ORDER — OMEPRAZOLE 20 MG PO CPDR: DELAYED_RELEASE_CAPSULE | ORAL | 3 refills | Status: DC

## 2021-03-25 NOTE — Assessment & Plan Note (Signed)
BP Readings from Last 3 Encounters:  03/25/21 124/80  01/11/21 138/76  03/22/20 118/74   Okay on metoprolol and amlodipine

## 2021-03-25 NOTE — Assessment & Plan Note (Signed)
I have personally reviewed the Medicare Annual Wellness questionnaire and have noted 1. The patient's medical and social history 2. Their use of alcohol, tobacco or illicit drugs 3. Their current medications and supplements 4. The patient's functional ability including ADL's, fall risks, home safety risks and hearing or visual             impairment. 5. Diet and physical activities 6. Evidence for depression or mood disorders  The patients weight, height, BMI and visual acuity have been recorded in the chart I have made referrals, counseling and provided education to the patient based review of the above and I have provided the pt with a written personalized care plan for preventive services.  I have provided you with a copy of your personalized plan for preventive services. Please take the time to review along with your updated medication list.  Done with cancer screening Had flu vaccine and bivalent COVID vaccines Td if any injury Discussed exercises

## 2021-03-25 NOTE — Assessment & Plan Note (Signed)
Done with boniva Will start prolia after approval

## 2021-03-25 NOTE — Assessment & Plan Note (Signed)
Still having pain Can continue meloxicam if it helps (try off it to see) Tylenol Starting calcitonin nasal spray

## 2021-03-25 NOTE — Assessment & Plan Note (Signed)
Eating well but slight weight loss Will monitor Discussed more boost Randall Hiss

## 2021-03-25 NOTE — Assessment & Plan Note (Signed)
No seizures on lower depakote level I will recheck the levels

## 2021-03-25 NOTE — Progress Notes (Signed)
Subjective:    Patient ID: KRYSTINA STRIETER, female    DOB: 27-Feb-1941, 80 y.o.   MRN: [id number]  HPI Here with DIL Jeani Hawking for Medicare wellness visit and follow up of chronic health conditions This visit occurred during the SARS-CoV-2 public health emergency.  Safety protocols were in place, including screening questions prior to the visit, additional usage of staff PPE, and extensive cleaning of exam room while observing appropriate contact time as indicated for disinfecting solutions.   Reviewed advanced directives Reviewed other doctors----Dr Whitsett--dentist, Dr Isenstein---dermatologist, Dr Archie Balboa No alcohol since stroke No tobacco Vision is fair--not as good since the stroke (does eye exercises) Hearing is not good---hasn't explored hearing aides Does the exercises from PT Has had 2 falls--no sig injury No depression or anhedonia Memory is okay--no sig problems. Some recall issues  Mostly stays at home. DIL/son do her shopping They use wheelchair for any extended walking She will do housework with walker--and very slowly. They get prepared foods. Husband mostly does laundry (or together). They do the cleaning together  No syncope since last time Did decrease the depakote to 250/500 No seizures  Appetite is okay Weight down 3# from last visit--fairly consistent for a year or more  No chest pain or SOB No dizziness--but feels weak No edema No palpitations Occasional vague headaches  Significant back pain--seeing Dr Kayleen Memos (spine specialist at [facility]) Meloxicam didn't really help DEXA showed osteoporosis Also had MRI of spine---has compression fractures in T11, T12 and especially L1 Prescribed calcitonin nasal spray but hasn't started it yet Past bisphosphonates for an extended time (fosamax/boniva) Tylenol 1000mg  does help some  Current Outpatient Medications on File Prior to Visit  Medication Sig Dispense Refill   amLODipine (NORVASC) 2.5 MG  tablet TAKE 3 TABLETS(7.5 MG) BY MOUTH DAILY 270 tablet 3   Cholecalciferol (VITAMIN D3) 50 MCG (2000 UT) CAPS Take by mouth.     divalproex (DEPAKOTE) 250 MG DR tablet Take 1 tablet (250 mg total) by mouth daily. 90 tablet 3   metoprolol succinate (TOPROL-XL) 50 MG 24 hr tablet TAKE 1 TABLET(50 MG) BY MOUTH DAILY WITH OR IMMEDIATELY FOLLOWING A MEAL 90 tablet 3   No current facility-administered medications on file prior to visit.    Allergies  Allergen Reactions   Phenytoin     Other reaction(s): Other (See Comments) Other Reaction: Other reaction REACTION: severe reaction   Septra [Sulfamethoxazole-Trimethoprim]     Nausea, trouble eating and drinking    Past Medical History:  Diagnosis Date   Fracture of metatarsal    Repair of fractured R metatarsal --Dr Duda---4/08   GERD (gastroesophageal reflux disease)    Hypertension    Melanoma in situ ([place]) 7/17   Dr Kellie Moor   Osteoporosis    Seizure disorder [facility])    Seizures ([place])    Stroke ([place]) 06/2019   Vitamin D deficiency     Past Surgical History:  Procedure Laterality Date   CATARACT EXTRACTION, BILATERAL Bilateral 2014   EYE SURGERY     Obstructed tear duct   FOOT SURGERY  2008   MELANOMA EXCISION Left 11/2015   left lower leg   TEAR DUCT PROBING  10/12   temporary stent   TONSILLECTOMY AND ADENOIDECTOMY  1948    Family History  Problem Relation Age of Onset   Hypertension Mother    Heart disease Father    Breast cancer Daughter 43   Diabetes Maternal Aunt    Coronary artery disease Paternal Aunt  Breast cancer Paternal Aunt    Coronary artery disease Paternal Uncle    Heart disease Maternal Grandmother    Heart disease Maternal Grandfather    Heart disease Paternal Grandmother    Heart disease Paternal Grandfather    Colon cancer Neg Hx    Stomach cancer Neg Hx    Pancreatic cancer Neg Hx    Rectal cancer Neg Hx     Social History   Socioeconomic History   Marital status: Married     Spouse name: Pilar Plate   Number of children: 3   Years of education: 12   Highest education level: Not on file  Occupational History   Occupation: retired Financial planner: retired  Tobacco Use   Smoking status: Never   Smokeless tobacco: Never  Substance and Sexual Activity   Alcohol use: Yes    Alcohol/week: 0.0 standard drinks    Comment: occasional   Drug use: No   Sexual activity: Yes    Birth control/protection: Post-menopausal  Other Topics Concern   Not on file  Social History Narrative   09/14/19 lives at home with husband   Regular exercise-yes---aerobics, Pilates, jogged in past (now walks)      Has living will   Husband, then one of her children, has health care POA   Would accept resuscitation but no prolonged artificial ventilation   Probably would not want tube feeds if cognitively unaware      Right Handed   Drinks 3-4 cups caffeine daily   Social Determinants of Health   Financial Resource Strain: Not on file  Food Insecurity: Not on file  Transportation Needs: Not on file  Physical Activity: Not on file  Stress: Not on file  Social Connections: Not on file  Intimate Partner Violence: Not on file   Review of Systems No recent dental visits--no tooth pain Sleeps okay No suspicious skin lesions today Gets some heartburn. Avoids eating late. Takes omeprazole--but not every day (about 3-4 days per week). No dysphagia Bowels move fine. No blood No other joint pains    Objective:   Physical Exam Constitutional:      Appearance: Normal appearance.  HENT:     Mouth/Throat:     Comments: No lesions Eyes:     Conjunctiva/sclera: Conjunctivae normal.     Pupils: Pupils are equal, round, and reactive to light.  Cardiovascular:     Rate and Rhythm: Normal rate and regular rhythm.     Pulses: Normal pulses.     Heart sounds: No murmur heard.   No gallop.  Pulmonary:     Effort: Pulmonary effort is normal.     Breath sounds: Normal breath  sounds. No wheezing or rales.  Abdominal:     Palpations: Abdomen is soft.     Tenderness: There is no abdominal tenderness.  Musculoskeletal:     Cervical back: Neck supple.     Right lower leg: No edema.     Left lower leg: No edema.  Lymphadenopathy:     Cervical: No cervical adenopathy.  Skin:    Findings: No lesion or rash.  Neurological:     Mental Status: She is alert and oriented to person, place, and time.     Comments: President--"Biden, TRump, Obama" [id number] Recall 3/3  Psychiatric:        Mood and Affect: Mood normal.        Behavior: Behavior normal.           Assessment &  Plan:

## 2021-03-25 NOTE — Progress Notes (Signed)
Vision Screening   Right eye Left eye Both eyes  Without correction     With correction 20/20 20/50 20/20   Hearing Screening - Comments:: Did not pass whisper test

## 2021-03-26 ENCOUNTER — Telehealth: Payer: Self-pay

## 2021-03-26 LAB — RENAL FUNCTION PANEL
Albumin: 4.6 g/dL (ref 3.5–5.2)
BUN: 22 mg/dL (ref 6–23)
CO2: 31 mEq/L (ref 19–32)
Calcium: 10.4 mg/dL (ref 8.4–10.5)
Chloride: 101 mEq/L (ref 96–112)
Creatinine, Ser: 0.69 mg/dL (ref 0.40–1.20)
GFR: 82.13 mL/min (ref >60.00)
Glucose, Bld: 122 mg/dL — ABNORMAL HIGH (ref 70–99)
Phosphorus: 3.8 mg/dL (ref 2.3–4.6)
Potassium: 4.4 mEq/L (ref 3.5–5.1)
Sodium: 141 mEq/L (ref 135–145)

## 2021-03-26 LAB — VITAMIN D 25 HYDROXY (VIT D DEFICIENCY, FRACTURES): VITD: 36.9 ng/mL (ref 30.00–100.00)

## 2021-03-26 LAB — VALPROIC ACID LEVEL: Valproic Acid Lvl: 55.1 mg/L (ref 50.0–100.0)

## 2021-03-26 NOTE — Telephone Encounter (Signed)
New start, per Dr Alla German comments below  "I would like to start her on prolia. Active vertebral fractures, severe osteoporosis and she has finished her 5 years with boniva. " Benefits submitted

## 2021-04-17 ENCOUNTER — Telehealth: Payer: Self-pay | Admitting: Internal Medicine

## 2021-04-17 NOTE — Telephone Encounter (Signed)
Pt called wanting to know the results of her blood work and checking the status of her prolia shot. Please advise.

## 2021-04-17 NOTE — Telephone Encounter (Signed)
Patient rescheduled to 05/02/21

## 2021-04-17 NOTE — Telephone Encounter (Signed)
Patient advised. OOP cost is $0. Lab done on 03/25/21, Nurse visit scheduled for 05/01/21. Calcium was normal at 10.4 CrCl is 51.64 mL/min

## 2021-04-17 NOTE — Telephone Encounter (Signed)
Spoke to pt about labs that were mailed out 03-27-21.   Will forward note to Anastasiya so she can update pt on Prolia.

## 2021-04-18 ENCOUNTER — Telehealth: Payer: Self-pay

## 2021-04-18 NOTE — Telephone Encounter (Signed)
PA done-waiting on decision , case # [id number]. Faxed clinical notes to [phone]

## 2021-04-18 NOTE — Telephone Encounter (Signed)
Patient stated that she has not started on this calcitonin nasal spray that was prescribed by [facility] provider because pharmacy had questions for that provider and has not heard from them. I advised patient she would need to follow up with [facility] on that question since they are managing that. Patient did want to ask Dr Silvio Pate if she needs to wait to start on the spray along with Prolia injection scheduled for 05/02/21 or wait or any other feedback. Also, patient stated if Dr Silvio Pate thinks she should start on the spray if he can take over this RX and send it in.

## 2021-04-19 NOTE — Telephone Encounter (Signed)
Spoke to pt. She will stick with the Prolia.

## 2021-04-19 NOTE — Telephone Encounter (Signed)
PA approved- 04/12/21-05/11/22. Approved EOC # [id number]

## 2021-04-29 ENCOUNTER — Other Ambulatory Visit: Payer: Self-pay

## 2021-04-29 ENCOUNTER — Ambulatory Visit
Admission: RE | Admit: 2021-04-29 | Discharge: 2021-04-29 | Disposition: A | Discharge status: Home / Self Care | Payer: Medicare PPO | Source: Ambulatory Visit | Attending: Internal Medicine | Admitting: Internal Medicine

## 2021-04-29 DIAGNOSIS — Z1231 Encounter for screening mammogram for malignant neoplasm of breast: Secondary | ICD-10-CM | POA: Diagnosis not present

## 2021-04-29 IMAGING — MG MM DIGITAL SCREENING BILAT W/ TOMO AND CAD
8 series · 8 of 24 positions shown · non-contrast
Comparison: Previous exam(s).

CLINICAL DATA: Screening.

EXAM:
DIGITAL SCREENING BILATERAL MAMMOGRAM WITH TOMOSYNTHESIS AND CAD
TECHNIQUE: Bilateral screening digital craniocaudal and mediolateral oblique
mammograms were obtained. Bilateral screening digital breast
tomosynthesis was performed. The images were evaluated with
computer-aided detection.

[R CC synth-2D]
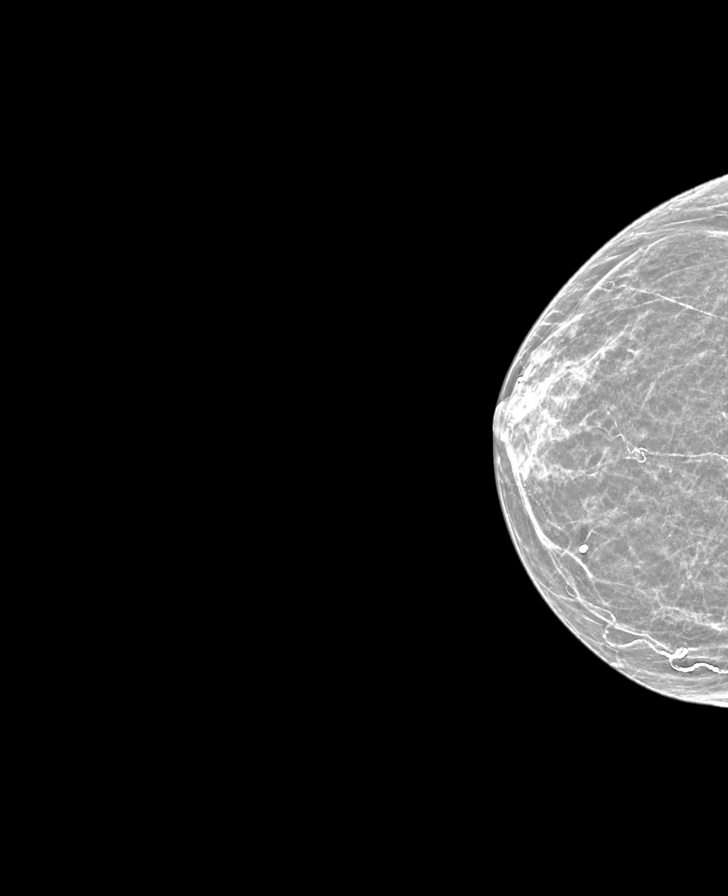

[L CC synth-2D]
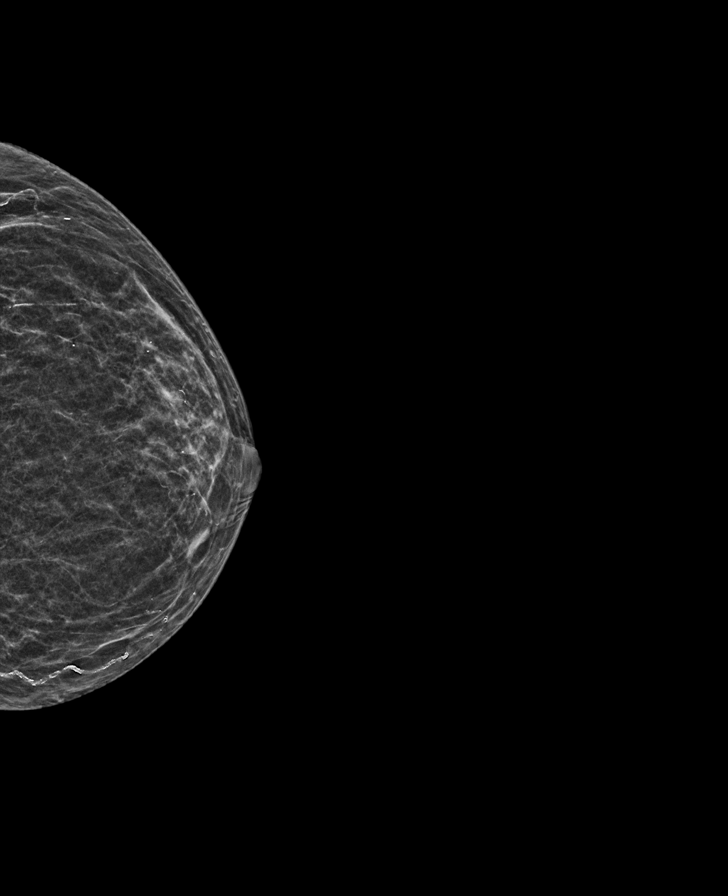

[L MLO synth-2D]
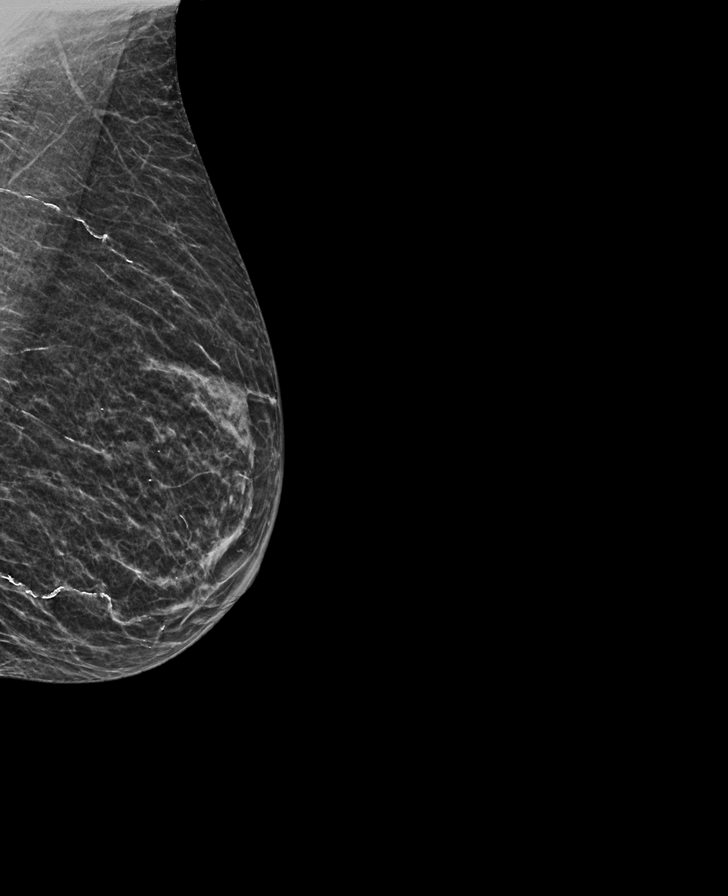

[R MLO synth-2D]
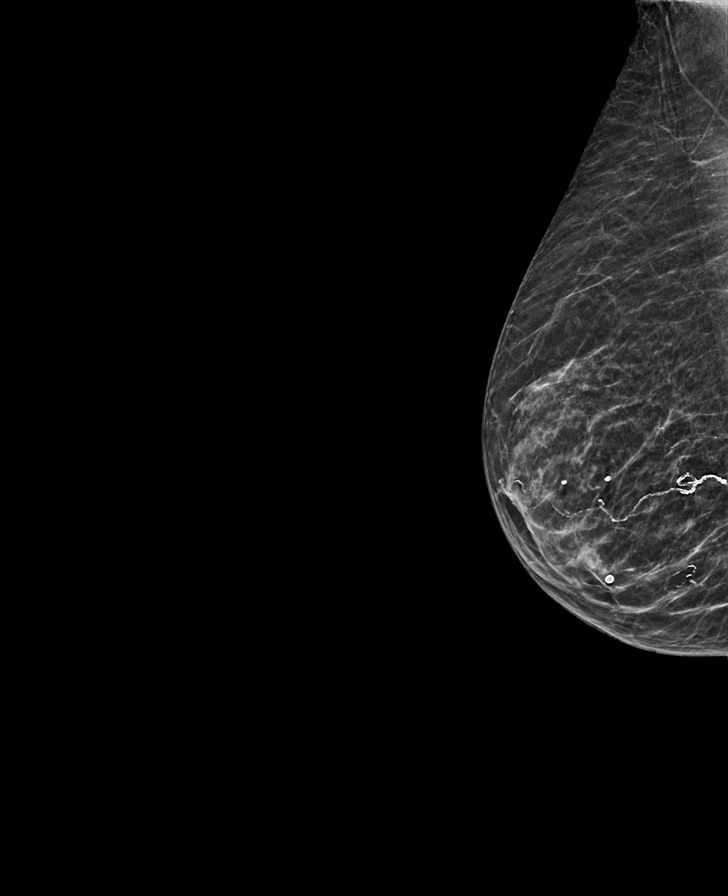

[R CC tomo · tomo slice 18/35.0]
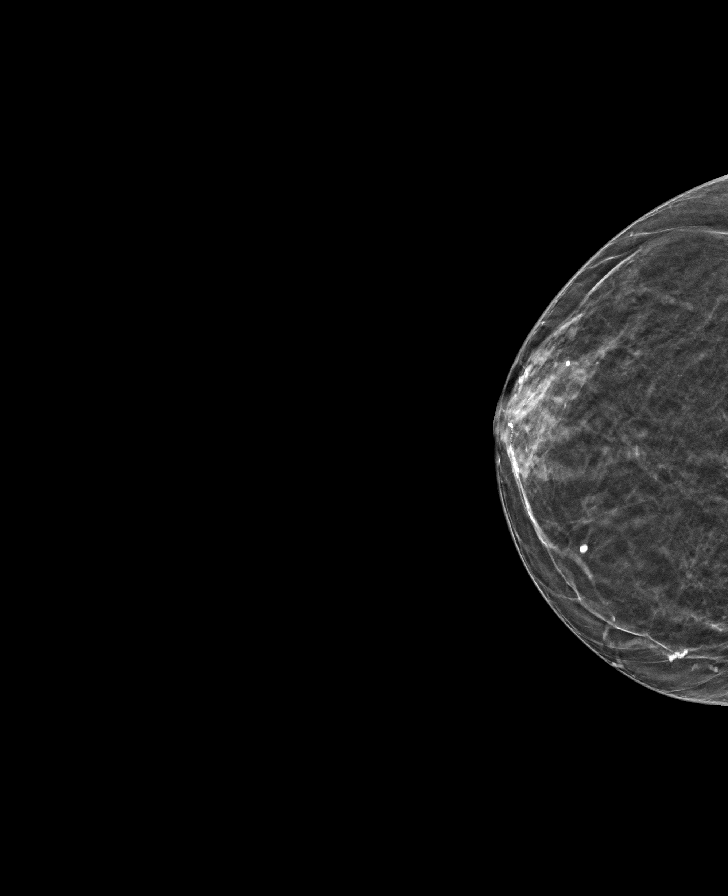

[L MLO tomo · tomo slice 18/35.0]
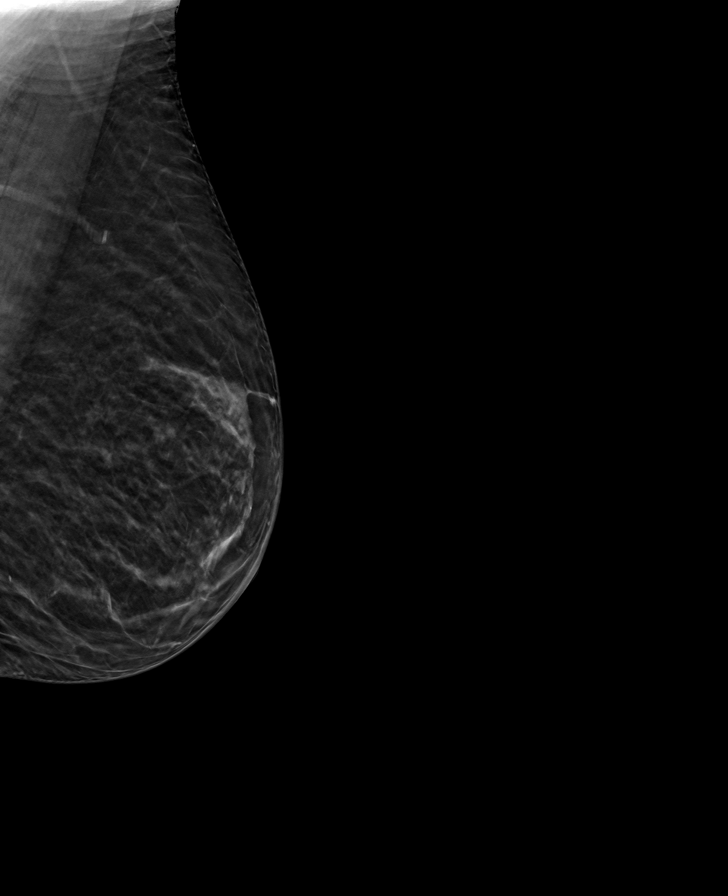

[L CC tomo · tomo slice 19/36.0]
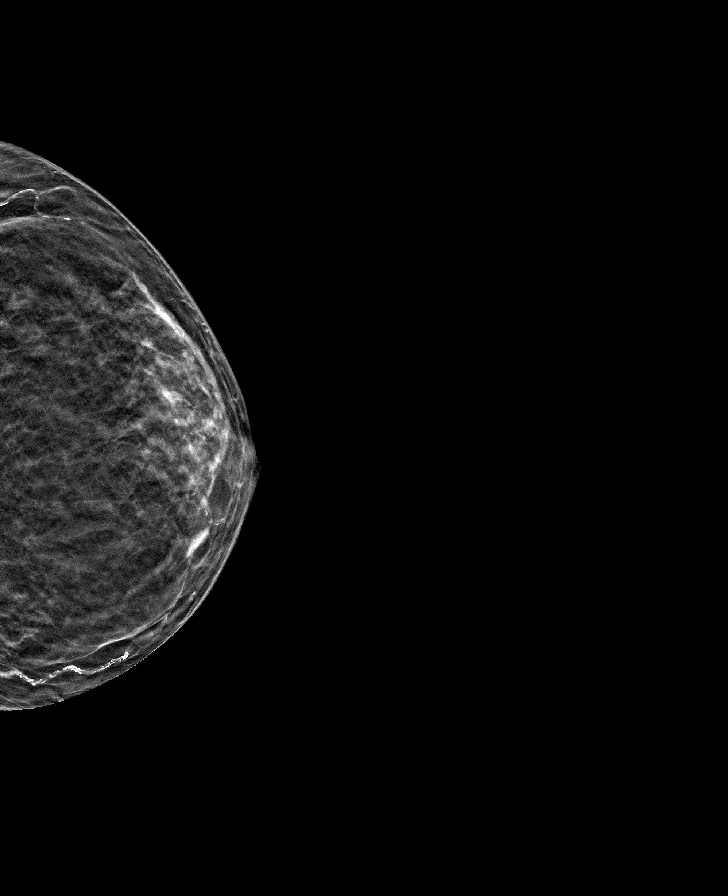

[R MLO tomo · tomo slice 19/37.0]
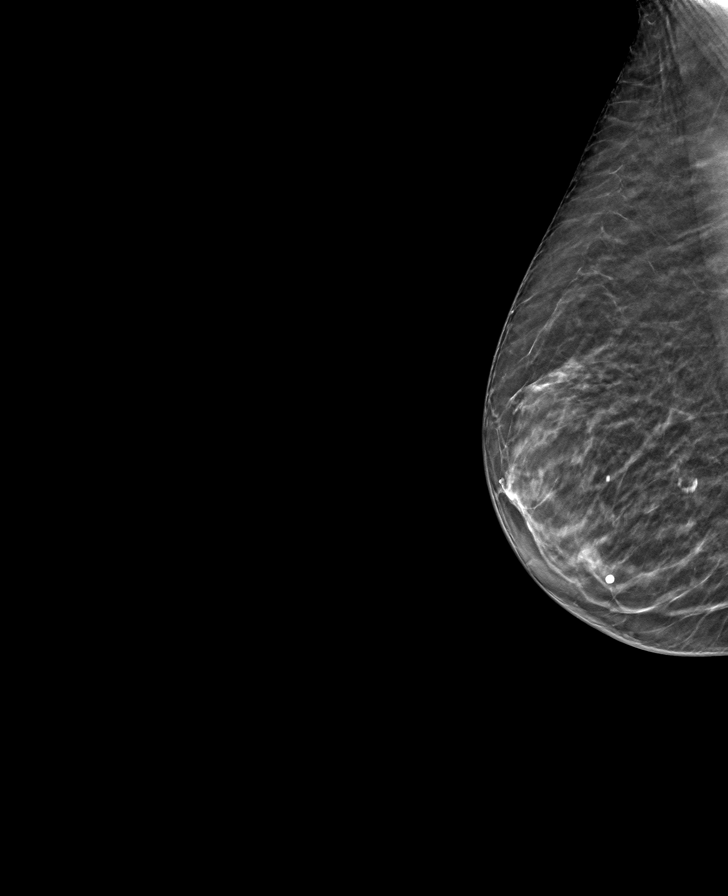

[8 of 24 positions shown; findings below may reference images not displayed]

ACR Breast Density Category b: There are scattered areas of
fibroglandular density.
FINDINGS: There are no findings suspicious for malignancy.
IMPRESSION: No mammographic evidence of malignancy. A result letter of this
screening mammogram will be mailed directly to the patient.

RECOMMENDATION:
Screening mammogram in one year. (Code:[BY])

BI-RADS CATEGORY  1: Negative.

## 2021-05-01 ENCOUNTER — Ambulatory Visit: Payer: Medicare PPO

## 2021-05-02 ENCOUNTER — Other Ambulatory Visit: Payer: Self-pay

## 2021-05-02 ENCOUNTER — Ambulatory Visit (INDEPENDENT_AMBULATORY_CARE_PROVIDER_SITE_OTHER): Payer: Medicare PPO

## 2021-05-02 DIAGNOSIS — M8000XA Age-related osteoporosis with current pathological fracture, unspecified site, initial encounter for fracture: Secondary | ICD-10-CM

## 2021-05-02 MED ORDER — DENOSUMAB 60 MG/ML ~~LOC~~ SOSY
60.0000 mg | PREFILLED_SYRINGE | Freq: Once | SUBCUTANEOUS | Status: AC
  Administered 2021-05-02: 11:00:00 60 mg via SUBCUTANEOUS

## 2021-05-02 NOTE — Progress Notes (Signed)
Per orders of Dr. Silvio Pate, injection of Prolia given by Loreen Freud. Patient tolerated injection well.

## 2021-06-24 DIAGNOSIS — M818 Other osteoporosis without current pathological fracture: Secondary | ICD-10-CM | POA: Diagnosis not present

## 2021-10-18 ENCOUNTER — Telehealth: Payer: Self-pay

## 2021-10-18 NOTE — Telephone Encounter (Signed)
Benefit submitted-pending Next injection due 11/01/21 or after. PA done 04/12/21-05/11/22

## 2021-10-19 ENCOUNTER — Other Ambulatory Visit: Payer: Self-pay | Admitting: Family

## 2021-10-19 DIAGNOSIS — G40909 Epilepsy, unspecified, not intractable, without status epilepticus: Secondary | ICD-10-CM

## 2021-10-22 NOTE — Telephone Encounter (Signed)
Spoke with patient Lab appt on 10/31/21 and NV 11/07/21

## 2021-10-22 NOTE — Telephone Encounter (Signed)
Spoke with Tanya Knight with insurance, OOP cost this time may be $64 or may be $0 she could not tell me 100%. Reference #[id number] Left message for patient to call back.

## 2021-10-31 ENCOUNTER — Other Ambulatory Visit (INDEPENDENT_AMBULATORY_CARE_PROVIDER_SITE_OTHER): Payer: Medicare PPO

## 2021-10-31 DIAGNOSIS — G40909 Epilepsy, unspecified, not intractable, without status epilepticus: Secondary | ICD-10-CM

## 2021-10-31 LAB — BASIC METABOLIC PANEL
BUN: 20 mg/dL (ref 6–23)
CO2: 29 mEq/L (ref 19–32)
Calcium: 9.8 mg/dL (ref 8.4–10.5)
Chloride: 103 mEq/L (ref 96–112)
Creatinine, Ser: 0.59 mg/dL (ref 0.40–1.20)
GFR: 84.93 mL/min (ref >60.00)
Glucose, Bld: 97 mg/dL (ref 70–99)
Potassium: 3.5 mEq/L (ref 3.5–5.1)
Sodium: 141 mEq/L (ref 135–145)

## 2021-11-07 ENCOUNTER — Ambulatory Visit (INDEPENDENT_AMBULATORY_CARE_PROVIDER_SITE_OTHER): Payer: Medicare PPO

## 2021-11-07 DIAGNOSIS — M8000XA Age-related osteoporosis with current pathological fracture, unspecified site, initial encounter for fracture: Secondary | ICD-10-CM | POA: Diagnosis not present

## 2021-11-07 MED ORDER — DENOSUMAB 60 MG/ML ~~LOC~~ SOSY
60.0000 mg | PREFILLED_SYRINGE | Freq: Once | SUBCUTANEOUS | Status: AC
  Administered 2021-11-07: 60 mg via SUBCUTANEOUS

## 2021-11-07 NOTE — Progress Notes (Signed)
Per orders of Dr. Silvio Pate, injection of Prolia given by Brenton Grills. Patient tolerated injection well.

## 2021-11-07 NOTE — Telephone Encounter (Signed)
Pt given Prolia inj today.  Fyi to Anastasiya.

## 2021-12-04 ENCOUNTER — Other Ambulatory Visit: Payer: Self-pay | Admitting: Internal Medicine

## 2021-12-19 ENCOUNTER — Other Ambulatory Visit: Payer: Self-pay | Admitting: Internal Medicine

## 2022-01-08 ENCOUNTER — Other Ambulatory Visit: Payer: Self-pay | Admitting: Internal Medicine

## 2022-02-17 ENCOUNTER — Other Ambulatory Visit: Payer: Self-pay | Admitting: Internal Medicine

## 2022-02-17 DIAGNOSIS — Z1231 Encounter for screening mammogram for malignant neoplasm of breast: Secondary | ICD-10-CM

## 2022-03-08 ENCOUNTER — Other Ambulatory Visit: Payer: Self-pay | Admitting: Internal Medicine

## 2022-03-16 ENCOUNTER — Other Ambulatory Visit: Payer: Self-pay | Admitting: Internal Medicine

## 2022-03-25 ENCOUNTER — Ambulatory Visit (INDEPENDENT_AMBULATORY_CARE_PROVIDER_SITE_OTHER): Payer: Medicare PPO | Admitting: Internal Medicine

## 2022-03-25 ENCOUNTER — Encounter: Payer: Self-pay | Admitting: Internal Medicine

## 2022-03-25 VITALS — BP 138/82 | HR 62 | Temp 97.8°F | Ht 62.0 in | Wt 113.0 lb

## 2022-03-25 DIAGNOSIS — K219 Gastro-esophageal reflux disease without esophagitis: Secondary | ICD-10-CM | POA: Diagnosis not present

## 2022-03-25 DIAGNOSIS — Z0000 Encounter for general adult medical examination without abnormal findings: Secondary | ICD-10-CM | POA: Diagnosis not present

## 2022-03-25 DIAGNOSIS — E441 Mild protein-calorie malnutrition: Secondary | ICD-10-CM

## 2022-03-25 DIAGNOSIS — M810 Age-related osteoporosis without current pathological fracture: Secondary | ICD-10-CM

## 2022-03-25 DIAGNOSIS — G40909 Epilepsy, unspecified, not intractable, without status epilepticus: Secondary | ICD-10-CM

## 2022-03-25 DIAGNOSIS — I10 Essential (primary) hypertension: Secondary | ICD-10-CM | POA: Diagnosis not present

## 2022-03-25 NOTE — Assessment & Plan Note (Signed)
I have personally reviewed the Medicare Annual Wellness questionnaire and have noted 1. The patient's medical and social history 2. Their use of alcohol, tobacco or illicit drugs 3. Their current medications and supplements 4. The patient's functional ability including ADL's, fall risks, home safety risks and hearing or visual             impairment. 5. Diet and physical activities 6. Evidence for depression or mood disorders  The patients weight, height, BMI and visual acuity have been recorded in the chart I have made referrals, counseling and provided education to the patient based review of the above and I have provided the pt with a written personalized care plan for preventive services.  I have provided you with a copy of your personalized plan for preventive services. Please take the time to review along with your updated medication list.  Discussed mammograms--should be done but she may get since daughter had breast cancer No other cancer screening Tries to do exercises regularly Had COVID, flu and RSV vaccines Td at pharmacy

## 2022-03-25 NOTE — Assessment & Plan Note (Signed)
No seizures on depakote 250AM/500 night Will check levels next month

## 2022-03-25 NOTE — Progress Notes (Signed)
Vision Screening   Right eye Left eye Both eyes  Without correction     With correction '20/30 20/50 20/25 '$  Hearing Screening - Comments:: Did not pass whisper test

## 2022-03-25 NOTE — Assessment & Plan Note (Signed)
Eats okay Weight has stabliized

## 2022-03-25 NOTE — Assessment & Plan Note (Signed)
Symptoms well controlled on the omeprazole 20 daily

## 2022-03-25 NOTE — Progress Notes (Signed)
Subjective:    Patient ID: Tanya Knight, female    DOB: Feb 24, 1941, 81 y.o.   MRN: [id number]  HPI Here with DIL Jeani Hawking for Medicare wellness visit and follow up of chronic health conditions Reviewed advanced directives Reviewed other doctors---Dr Sweeney--endocrine (?---about osteoporosis), Dr Archie Balboa, Dr Morrison Old, Dr Quintella Reichert No hospitalizations or surgery this year. Vision is okay Hearing is bad--considering audiology No alcohol or tobacco No falls No depression or anhedonia Keeps up with exercises that PT did originally Uses walker at home---does light housework (if she can hold onto something). Son/DIL do shopping. Mostly prepared foods. Husband helps No sig memory issues--just some recall problems  Doing well Now getting prolia every 6 months  No seizures  Weight is holding Eating fairly well  Omeprazole controls the reflux No dysphagia  No chest pain or SOB No dizziness or syncope No edema No palpitations No headaches  Current Outpatient Medications on File Prior to Visit  Medication Sig Dispense Refill   acetaminophen (TYLENOL) 500 MG tablet Take 1,000 mg by mouth in the morning and at bedtime.     amLODipine (NORVASC) 2.5 MG tablet TAKE 3 TABLETS(7.5 MG) BY MOUTH DAILY 270 tablet 0   Cholecalciferol (VITAMIN D3) 50 MCG (2000 UT) CAPS Take 1 capsule by mouth in the morning and at bedtime.     divalproex (DEPAKOTE) 250 MG DR tablet TAKE 1 TABLET(250 MG) BY MOUTH DAILY 90 tablet 3   divalproex (DEPAKOTE) 500 MG DR tablet TAKE 1 TABLET(500 MG) BY MOUTH TWICE DAILY 180 tablet 0   metoprolol succinate (TOPROL-XL) 50 MG 24 hr tablet TAKE 1 TABLET(50 MG) BY MOUTH DAILY WITH OR IMMEDIATELY FOLLOWING A MEAL 90 tablet 3   omeprazole (PRILOSEC) 20 MG capsule TAKE 1 CAPSULE(20 MG) BY MOUTH DAILY 90 capsule 3   No current facility-administered medications on file prior to visit.    Allergies  Allergen Reactions   Phenytoin     Other  reaction(s): Other (See Comments) Other Reaction: Other reaction REACTION: severe reaction   Septra [Sulfamethoxazole-Trimethoprim]     Nausea, trouble eating and drinking    Past Medical History:  Diagnosis Date   Fracture of metatarsal    Repair of fractured R metatarsal --Dr Duda---4/08   GERD (gastroesophageal reflux disease)    Hypertension    Melanoma in situ ([place]) 7/17   Dr Kellie Moor   Osteoporosis    Seizure disorder [facility])    Seizures ([place])    Stroke ([place]) 06/2019   Vitamin D deficiency     Past Surgical History:  Procedure Laterality Date   CATARACT EXTRACTION, BILATERAL Bilateral 2014   EYE SURGERY     Obstructed tear duct   FOOT SURGERY  2008   MELANOMA EXCISION Left 11/2015   left lower leg   TEAR DUCT PROBING  10/12   temporary stent   TONSILLECTOMY AND ADENOIDECTOMY  1948    Family History  Problem Relation Age of Onset   Hypertension Mother    Heart disease Father    Breast cancer Daughter 74   Diabetes Maternal Aunt    Coronary artery disease Paternal Aunt    Breast cancer Paternal Aunt    Coronary artery disease Paternal Uncle    Heart disease Maternal Grandmother    Heart disease Maternal Grandfather    Heart disease Paternal Grandmother    Heart disease Paternal Grandfather    Colon cancer Neg Hx    Stomach cancer Neg Hx    Pancreatic cancer Neg Hx  Rectal cancer Neg Hx     Social History   Socioeconomic History   Marital status: Married    Spouse name: Pilar Plate   Number of children: 3   Years of education: 12   Highest education level: Not on file  Occupational History   Occupation: retired Financial planner: retired  Tobacco Use   Smoking status: Never    Passive exposure: Never   Smokeless tobacco: Never  Substance and Sexual Activity   Alcohol use: Yes    Alcohol/week: 0.0 standard drinks of alcohol    Comment: occasional   Drug use: No   Sexual activity: Yes    Birth control/protection: Post-menopausal   Other Topics Concern   Not on file  Social History Narrative   09/14/19 lives at home with husband   Regular exercise-yes---aerobics, Pilates, jogged in past (now walks)      Has living will   Husband, then one of her children, has health care POA   Would accept resuscitation but no prolonged artificial ventilation   Probably would not want tube feeds if cognitively unaware      Right Handed   Drinks 3-4 cups caffeine daily   Social Determinants of Health   Financial Resource Strain: Not on file  Food Insecurity: Not on file  Transportation Needs: Not on file  Physical Activity: Not on file  Stress: Not on file  Social Connections: Not on file  Intimate Partner Violence: Not on file   Review of Systems Sleeps pretty well Wears seat belt Teeth okay---keeps up with dentist No urinary problems Bowels move fine--no blood Some ongoing back pain--if prolonged standing. Tylenol bid helps (off meloxicam) No other joint issues     Objective:   Physical Exam Constitutional:      Appearance: Normal appearance.     Comments: Slight wasting  HENT:     Mouth/Throat:     Comments: No lesions Eyes:     Conjunctiva/sclera: Conjunctivae normal.     Pupils: Pupils are equal, round, and reactive to light.  Cardiovascular:     Rate and Rhythm: Normal rate and regular rhythm.     Pulses: Normal pulses.     Heart sounds: No murmur heard.    No gallop.  Pulmonary:     Effort: Pulmonary effort is normal.     Breath sounds: Normal breath sounds. No wheezing or rales.  Abdominal:     Palpations: Abdomen is soft.     Tenderness: There is no abdominal tenderness.  Musculoskeletal:     Cervical back: Neck supple.     Right lower leg: No edema.     Left lower leg: No edema.  Lymphadenopathy:     Cervical: No cervical adenopathy.  Skin:    Findings: No lesion or rash.  Neurological:     General: No focal deficit present.     Mental Status: She is alert and oriented to person, place,  and time.     Comments: Mini-cog okay --clock fine, recall 2/3  Psychiatric:        Mood and Affect: Mood normal.        Behavior: Behavior normal.            Assessment & Plan:

## 2022-03-25 NOTE — Assessment & Plan Note (Signed)
Dietary calcium, vitamin D and prolia every 6 months (due again next month)

## 2022-03-25 NOTE — Assessment & Plan Note (Signed)
BP Readings from Last 3 Encounters:  03/25/22 138/82  03/25/21 124/80  01/11/21 138/76   Doing okay on amlodipine2.'5mg'$ , metoprolol 50 daliy

## 2022-03-27 ENCOUNTER — Telehealth: Payer: Self-pay | Admitting: Internal Medicine

## 2022-03-27 NOTE — Telephone Encounter (Signed)
Spoke to pt

## 2022-03-27 NOTE — Telephone Encounter (Signed)
Patient called in and had some questions regarding the Prolia shot. She was wanting to know if December 20th would be to early to get one. She can be reached at [phone]. Thank you!

## 2022-03-27 NOTE — Telephone Encounter (Signed)
Last was 11-07-21. Cannot be done before 05-09-22. The appt 05-02-22 is a mammogram.

## 2022-04-11 ENCOUNTER — Other Ambulatory Visit (HOSPITAL_COMMUNITY): Payer: Self-pay

## 2022-04-11 ENCOUNTER — Telehealth: Payer: Self-pay

## 2022-04-11 NOTE — Telephone Encounter (Signed)
Pharmacy Patient Advocate Encounter   Received notification from [facility] that prior authorization for Prolia '60MG'$ /ML syringes is required/requested.   PA submitted on 04/11/22 to [employer] (medical) via CoverMyMeds Key [id number]  Status is pending

## 2022-04-11 NOTE — Telephone Encounter (Signed)
Pharmacy Patient Advocate Encounter  Insurance verification completed.    The patient is insured through Prolia '60mg'$    Ran test claims for: [employer] Gold Plus.  Pharmacy benefit copay: $64.00

## 2022-04-14 NOTE — Telephone Encounter (Signed)
Patient Advocate Encounter  Prior Authorization for Prolia '60MG'$ /ML syringes has been approved.    PA# [id number] Key: [id number]   Effective dates: 04/12/21 through 05/11/22  Approval letter attached to charts

## 2022-04-15 ENCOUNTER — Other Ambulatory Visit: Payer: Self-pay | Admitting: Internal Medicine

## 2022-04-16 ENCOUNTER — Telehealth: Payer: Self-pay

## 2022-04-16 NOTE — Telephone Encounter (Addendum)
Last Prolia inj: 11/07/21 Next Prolia inj DUE: 05/07/22  PA# [id number] Key: [id number]    Effective dates: 04/12/21 through 05/11/22

## 2022-04-17 NOTE — Telephone Encounter (Signed)
Pt ready for scheduling on or after 05/07/22  Out-of-pocket cost due at time of visit: $20 (copay)  Primary:  Prolia co-insurance:  Admin fee co-insurance:   Secondary:  Prolia co-insurance:  Admin fee co-insurance:   Deductible:   Prior Auth:  PA# [id number] Valid: 04/12/21-05/11/22    ** This summary of benefits is an estimation of the patient's out-of-pocket cost. Exact cost may very based on individual plan coverage.

## 2022-04-21 NOTE — Telephone Encounter (Signed)
Left message to return call to our office.  Added to list.

## 2022-04-24 ENCOUNTER — Telehealth: Payer: Self-pay | Admitting: Internal Medicine

## 2022-04-24 NOTE — Telephone Encounter (Signed)
Patient called and asked when can she get her prolia shot in December and labs. Call back number [phone].

## 2022-04-25 NOTE — Telephone Encounter (Signed)
Patient called in and wanted to follow up when she can be scheduled for her Prolia shot.

## 2022-04-29 DIAGNOSIS — H903 Sensorineural hearing loss, bilateral: Secondary | ICD-10-CM | POA: Diagnosis not present

## 2022-04-29 NOTE — Telephone Encounter (Signed)
Have reviewed with patient and appointment have been scheduled

## 2022-04-29 NOTE — Telephone Encounter (Signed)
Called patient app have been made.  Lab 12/21 N/V 12/28

## 2022-04-30 ENCOUNTER — Ambulatory Visit
Admission: RE | Admit: 2022-04-30 | Discharge: 2022-04-30 | Disposition: A | Discharge status: Home / Self Care | Payer: Medicare PPO | Source: Ambulatory Visit | Attending: Internal Medicine | Admitting: Internal Medicine

## 2022-04-30 DIAGNOSIS — Z1231 Encounter for screening mammogram for malignant neoplasm of breast: Secondary | ICD-10-CM | POA: Diagnosis not present

## 2022-05-01 ENCOUNTER — Other Ambulatory Visit (INDEPENDENT_AMBULATORY_CARE_PROVIDER_SITE_OTHER): Payer: Medicare PPO

## 2022-05-01 ENCOUNTER — Other Ambulatory Visit: Payer: Self-pay | Admitting: Internal Medicine

## 2022-05-01 DIAGNOSIS — I10 Essential (primary) hypertension: Secondary | ICD-10-CM | POA: Diagnosis not present

## 2022-05-01 DIAGNOSIS — G40909 Epilepsy, unspecified, not intractable, without status epilepticus: Secondary | ICD-10-CM | POA: Diagnosis not present

## 2022-05-01 DIAGNOSIS — R928 Other abnormal and inconclusive findings on diagnostic imaging of breast: Secondary | ICD-10-CM

## 2022-05-01 DIAGNOSIS — N6489 Other specified disorders of breast: Secondary | ICD-10-CM

## 2022-05-01 LAB — CBC
HCT: 43.5 % (ref 36.0–46.0)
Hemoglobin: 14.6 g/dL (ref 12.0–15.0)
MCHC: 33.7 g/dL (ref 30.0–36.0)
MCV: 95.3 fl (ref 78.0–100.0)
Platelets: 309 10*3/uL (ref 150.0–400.0)
RBC: 4.56 Mil/uL (ref 3.87–5.11)
RDW: 14.1 % (ref 11.5–15.5)
WBC: 6.3 10*3/uL (ref 4.0–10.5)

## 2022-05-01 LAB — COMPREHENSIVE METABOLIC PANEL
ALT: 8 U/L (ref 0–35)
AST: 15 U/L (ref 0–37)
Albumin: 4.5 g/dL (ref 3.5–5.2)
Alkaline Phosphatase: 94 U/L (ref 39–117)
BUN: 13 mg/dL (ref 6–23)
CO2: 31 mEq/L (ref 19–32)
Calcium: 10 mg/dL (ref 8.4–10.5)
Chloride: 103 mEq/L (ref 96–112)
Creatinine, Ser: 0.59 mg/dL (ref 0.40–1.20)
GFR: 84.64 mL/min (ref >60.00)
Glucose, Bld: 96 mg/dL (ref 70–99)
Potassium: 4.7 mEq/L (ref 3.5–5.1)
Sodium: 141 mEq/L (ref 135–145)
Total Bilirubin: 0.5 mg/dL (ref 0.2–1.2)
Total Protein: 7 g/dL (ref 6.0–8.3)

## 2022-05-01 LAB — TSH: TSH: 1.71 u[IU]/mL (ref 0.35–5.50)

## 2022-05-02 LAB — VALPROIC ACID LEVEL: Valproic Acid Lvl: 61.2 mg/L (ref 50.0–100.0)

## 2022-05-06 NOTE — Telephone Encounter (Signed)
60.51 mL/min is your estimated creatinine clearance Calcium 10.0

## 2022-05-08 ENCOUNTER — Ambulatory Visit: Payer: Medicare PPO

## 2022-05-09 ENCOUNTER — Telehealth: Payer: Self-pay | Admitting: Internal Medicine

## 2022-05-09 ENCOUNTER — Ambulatory Visit (INDEPENDENT_AMBULATORY_CARE_PROVIDER_SITE_OTHER): Payer: Medicare PPO

## 2022-05-09 DIAGNOSIS — M8000XA Age-related osteoporosis with current pathological fracture, unspecified site, initial encounter for fracture: Secondary | ICD-10-CM | POA: Diagnosis not present

## 2022-05-09 MED ORDER — DENOSUMAB 60 MG/ML ~~LOC~~ SOSY
60.0000 mg | PREFILLED_SYRINGE | Freq: Once | SUBCUTANEOUS | Status: AC
  Administered 2022-05-09: 60 mg via SUBCUTANEOUS

## 2022-05-09 MED ORDER — DENOSUMAB 60 MG/ML ~~LOC~~ SOSY: 60.0000 mg | PREFILLED_SYRINGE | Freq: Once | SUBCUTANEOUS | 0 refills | Status: DC

## 2022-05-09 NOTE — Progress Notes (Cosign Needed)
Per orders of Dr. Graham Duncan, injection of Prolia given by Milferd Ansell in right arm . Patient tolerated injection well.     

## 2022-05-09 NOTE — Telephone Encounter (Signed)
Patient came in office to drop off paperwork for PCP to fill out and has been placed in PCP folder. Call back number [phone]

## 2022-05-14 ENCOUNTER — Other Ambulatory Visit: Payer: Medicare PPO

## 2022-05-14 NOTE — Telephone Encounter (Signed)
Form placed in Dr Letvak's inbox on his desk 

## 2022-05-20 NOTE — Telephone Encounter (Signed)
Form was placed in sent out mail bin to be mailed out

## 2022-05-21 DIAGNOSIS — H903 Sensorineural hearing loss, bilateral: Secondary | ICD-10-CM | POA: Diagnosis not present

## 2022-05-22 ENCOUNTER — Ambulatory Visit
Admission: RE | Admit: 2022-05-22 | Discharge: 2022-05-22 | Disposition: A | Discharge status: Home / Self Care | Payer: Medicare PPO | Source: Ambulatory Visit | Attending: Internal Medicine | Admitting: Internal Medicine

## 2022-05-22 DIAGNOSIS — R922 Inconclusive mammogram: Secondary | ICD-10-CM | POA: Diagnosis not present

## 2022-05-22 DIAGNOSIS — N6489 Other specified disorders of breast: Secondary | ICD-10-CM | POA: Diagnosis not present

## 2022-05-22 DIAGNOSIS — R928 Other abnormal and inconclusive findings on diagnostic imaging of breast: Secondary | ICD-10-CM

## 2022-06-05 DIAGNOSIS — H903 Sensorineural hearing loss, bilateral: Secondary | ICD-10-CM | POA: Diagnosis not present

## 2022-06-06 ENCOUNTER — Other Ambulatory Visit: Payer: Self-pay | Admitting: Internal Medicine

## 2022-10-09 DIAGNOSIS — H903 Sensorineural hearing loss, bilateral: Secondary | ICD-10-CM | POA: Diagnosis not present

## 2022-10-31 ENCOUNTER — Other Ambulatory Visit (HOSPITAL_COMMUNITY): Payer: Self-pay

## 2022-10-31 ENCOUNTER — Telehealth: Payer: Self-pay | Admitting: Internal Medicine

## 2022-10-31 NOTE — Telephone Encounter (Signed)
Have we received auth for this year?

## 2022-10-31 NOTE — Telephone Encounter (Signed)
Prolia VOB initiated via [email or web address]  Last Prolia inj: 05/09/22 Next Prolia inj DUE: 11/06/22

## 2022-10-31 NOTE — Telephone Encounter (Signed)
Patient said that she gets her prolia injection every 56 months.She got her last one 05/09/2022.She would like to know how she should go about receiving her injection for this month? Will someone call her to schedule?Do she have to come in for labs before? Please advise.

## 2022-10-31 NOTE — Telephone Encounter (Signed)
Created new encounter for Prolia BIV. Will route encounter back once benefit verification is complete.  

## 2022-11-05 ENCOUNTER — Encounter: Payer: Self-pay | Admitting: Internal Medicine

## 2022-11-05 NOTE — Telephone Encounter (Signed)
Error

## 2022-11-11 NOTE — Telephone Encounter (Signed)
Patient contacted the office regarding this prior auth and to follow up on request. Patient would like an update if able. Please advise, thank you

## 2022-11-18 ENCOUNTER — Other Ambulatory Visit (HOSPITAL_COMMUNITY): Payer: Self-pay

## 2022-11-18 NOTE — Telephone Encounter (Signed)
Placed a call to [facility] at [phone].  The patient will have a copay of $20 for the office visit and $20 for the admin fee. There is no deductible and a prior authorization is on file.   PA: [id number] Dates: 05/02/22 to 05/12/2023

## 2022-11-18 NOTE — Telephone Encounter (Signed)
Pt ready for scheduling for Prolia on or after : 11/18/22  Out-of-pocket cost due at time of visit: $40  Primary: Hum[employer] - Medicare Prolia co-insurance: $20 Admin fee co-insurance: $20  Secondary: N/A Prolia co-insurance:  Admin fee co-insurance:   Medical Benefit Details: Date Benefits were checked: 11/18/22 Deductible: no/ Coinsurance: $20/ Admin Fee: $20  Prior Auth: Approved PA# [id number] Expiration Date: 05/12/2023   Pharmacy benefit: Copay $64 If patient wants fill through the pharmacy benefit please send prescription to:  [facility] , and include estimated need by date in rx notes. Pharmacy will ship medication directly to the office.  Patient not eligible for Prolia Copay Card. Copay Card can make patient's cost as little as $25. Link to apply: [email or web address]  ** This summary of benefits is an estimation of the patient's out-of-pocket cost. Exact cost may very based on individual plan coverage.

## 2022-11-20 NOTE — Telephone Encounter (Signed)
Majestic from [employer] called in to let provider know that  patient has been approved for her prolia injection.  Authorization number: [id number]

## 2022-11-21 NOTE — Telephone Encounter (Signed)
Please see separate encounter on 04/16/22

## 2022-11-25 ENCOUNTER — Telehealth: Payer: Self-pay | Admitting: Internal Medicine

## 2022-11-25 NOTE — Telephone Encounter (Signed)
Pt's son, Homero Fellers, called stating he was told by the p's insurance, Hum[employer], that the pt's prolia had been approved. Homero Fellers has requested to schedule labs & inj. Call back # [phone]

## 2022-11-27 NOTE — Telephone Encounter (Signed)
Patients son called back regarding his mom getting set up for her prolia injection. He would like a phone call as soon as possible.

## 2022-11-27 NOTE — Telephone Encounter (Signed)
Patients son returned call,he would like a call back

## 2022-11-27 NOTE — Telephone Encounter (Signed)
Phone note has information left message to call patient and set up appointments.

## 2022-11-27 NOTE — Telephone Encounter (Signed)
Left message to return call to our office.  Need to talk to son when he calls to set up appointments and give co pay information.

## 2022-12-01 ENCOUNTER — Other Ambulatory Visit: Payer: Self-pay | Admitting: Internal Medicine

## 2022-12-01 ENCOUNTER — Other Ambulatory Visit: Payer: Self-pay

## 2022-12-01 DIAGNOSIS — M8000XA Age-related osteoporosis with current pathological fracture, unspecified site, initial encounter for fracture: Secondary | ICD-10-CM

## 2022-12-01 DIAGNOSIS — M810 Age-related osteoporosis without current pathological fracture: Secondary | ICD-10-CM

## 2022-12-01 NOTE — Telephone Encounter (Signed)
Called patient reviewed all following information including appointment, Co pay due at time of visit and if pick up of injection is needed from outside pharmacy.     Out of pocket for patient: $40 buy and Bill   Lab appointment :  12/04/22  Nurse visit:  12/11/22  Lab order placed: Yes  Prolia has been  [x]   Ordered  []   Script sent to local pharmacy for patient to bring   []   Script sent to Specialty pharmacy   []   Script sent to [employer] to deliver

## 2022-12-01 NOTE — Telephone Encounter (Signed)
Duplicate message appointment have been scheduled for family.

## 2022-12-03 ENCOUNTER — Other Ambulatory Visit: Payer: Self-pay | Admitting: Internal Medicine

## 2022-12-04 ENCOUNTER — Other Ambulatory Visit (INDEPENDENT_AMBULATORY_CARE_PROVIDER_SITE_OTHER): Payer: Medicare PPO

## 2022-12-04 ENCOUNTER — Other Ambulatory Visit: Payer: Medicare PPO

## 2022-12-04 DIAGNOSIS — M8000XA Age-related osteoporosis with current pathological fracture, unspecified site, initial encounter for fracture: Secondary | ICD-10-CM

## 2022-12-04 DIAGNOSIS — M810 Age-related osteoporosis without current pathological fracture: Secondary | ICD-10-CM

## 2022-12-11 ENCOUNTER — Ambulatory Visit (INDEPENDENT_AMBULATORY_CARE_PROVIDER_SITE_OTHER): Payer: Medicare PPO

## 2022-12-11 DIAGNOSIS — M810 Age-related osteoporosis without current pathological fracture: Secondary | ICD-10-CM | POA: Diagnosis not present

## 2022-12-11 MED ORDER — DENOSUMAB 60 MG/ML ~~LOC~~ SOSY
60.0000 mg | PREFILLED_SYRINGE | Freq: Once | SUBCUTANEOUS | Status: AC
  Administered 2022-12-11: 60 mg via SUBCUTANEOUS

## 2022-12-11 NOTE — Progress Notes (Signed)
Per orders of Dr. Tillman Abide, injection of prollia 60 mg given [place] by Lewanda Rife in left arm Patient tolerated injection well. Patient will make appointment for 6 month.

## 2022-12-18 ENCOUNTER — Telehealth: Payer: Self-pay

## 2022-12-18 NOTE — Patient Outreach (Signed)
  Care Coordination   12/18/2022 Name: Tanya Knight MRN: [id number] DOB: 09-02-1940   Care Coordination Outreach Attempts:  An unsuccessful telephone outreach was attempted today to offer the patient information about available care coordination services.  Follow Up Plan:  Additional outreach attempts will be made to offer the patient care coordination information and services.   Encounter Outcome:  No Answer   Care Coordination Interventions:  No, not indicated    George Ina [facility] Care Coordination [phone] direct line

## 2022-12-22 ENCOUNTER — Telehealth: Payer: Self-pay

## 2022-12-22 NOTE — Patient Outreach (Signed)
  Care Coordination   12/22/2022 Name: Tanya Knight MRN: [id number] DOB: [birth year]   Care Coordination Outreach Attempts:  An unsuccessful telephone outreach was attempted today to offer the patient information about available care coordination services. HIPAA compliant message left.   Follow Up Plan:  Additional outreach attempts will be made to offer the patient care coordination information and services.   Encounter Outcome:  No Answer   Care Coordination Interventions:  No, not indicated    George Ina [facility] Care Coordination [phone] direct line

## 2022-12-25 DIAGNOSIS — H43813 Vitreous degeneration, bilateral: Secondary | ICD-10-CM | POA: Diagnosis not present

## 2022-12-25 DIAGNOSIS — H43812 Vitreous degeneration, left eye: Secondary | ICD-10-CM | POA: Diagnosis not present

## 2022-12-25 DIAGNOSIS — H04202 Unspecified epiphora, left lacrimal gland: Secondary | ICD-10-CM | POA: Diagnosis not present

## 2022-12-25 DIAGNOSIS — Z8673 Personal history of transient ischemic attack (TIA), and cerebral infarction without residual deficits: Secondary | ICD-10-CM | POA: Diagnosis not present

## 2022-12-25 DIAGNOSIS — Z0100 Encounter for examination of eyes and vision without abnormal findings: Secondary | ICD-10-CM | POA: Diagnosis not present

## 2022-12-25 DIAGNOSIS — H43811 Vitreous degeneration, right eye: Secondary | ICD-10-CM | POA: Diagnosis not present

## 2023-01-02 DIAGNOSIS — Z85828 Personal history of other malignant neoplasm of skin: Secondary | ICD-10-CM | POA: Diagnosis not present

## 2023-01-02 DIAGNOSIS — D225 Melanocytic nevi of trunk: Secondary | ICD-10-CM | POA: Diagnosis not present

## 2023-01-02 DIAGNOSIS — Z872 Personal history of diseases of the skin and subcutaneous tissue: Secondary | ICD-10-CM | POA: Diagnosis not present

## 2023-01-02 DIAGNOSIS — D2261 Melanocytic nevi of right upper limb, including shoulder: Secondary | ICD-10-CM | POA: Diagnosis not present

## 2023-01-02 DIAGNOSIS — Z85820 Personal history of malignant melanoma of skin: Secondary | ICD-10-CM | POA: Diagnosis not present

## 2023-01-02 DIAGNOSIS — D2272 Melanocytic nevi of left lower limb, including hip: Secondary | ICD-10-CM | POA: Diagnosis not present

## 2023-01-02 DIAGNOSIS — D2271 Melanocytic nevi of right lower limb, including hip: Secondary | ICD-10-CM | POA: Diagnosis not present

## 2023-01-02 DIAGNOSIS — D2262 Melanocytic nevi of left upper limb, including shoulder: Secondary | ICD-10-CM | POA: Diagnosis not present

## 2023-01-02 DIAGNOSIS — Z86006 Personal history of melanoma in-situ: Secondary | ICD-10-CM | POA: Diagnosis not present

## 2023-01-09 ENCOUNTER — Other Ambulatory Visit: Payer: Self-pay | Admitting: Internal Medicine

## 2023-03-16 ENCOUNTER — Other Ambulatory Visit: Payer: Self-pay | Admitting: Internal Medicine

## 2023-03-31 ENCOUNTER — Encounter: Payer: Self-pay | Admitting: Internal Medicine

## 2023-03-31 ENCOUNTER — Ambulatory Visit: Payer: Medicare PPO | Admitting: Internal Medicine

## 2023-03-31 VITALS — BP 130/86 | HR 67 | Temp 97.7°F | Ht 60.25 in | Wt 115.0 lb

## 2023-03-31 DIAGNOSIS — I10 Essential (primary) hypertension: Secondary | ICD-10-CM | POA: Diagnosis not present

## 2023-03-31 DIAGNOSIS — Z0000 Encounter for general adult medical examination without abnormal findings: Secondary | ICD-10-CM | POA: Diagnosis not present

## 2023-03-31 DIAGNOSIS — E441 Mild protein-calorie malnutrition: Secondary | ICD-10-CM | POA: Diagnosis not present

## 2023-03-31 DIAGNOSIS — G40909 Epilepsy, unspecified, not intractable, without status epilepticus: Secondary | ICD-10-CM

## 2023-03-31 DIAGNOSIS — M810 Age-related osteoporosis without current pathological fracture: Secondary | ICD-10-CM | POA: Diagnosis not present

## 2023-03-31 DIAGNOSIS — Z23 Encounter for immunization: Secondary | ICD-10-CM | POA: Diagnosis not present

## 2023-03-31 LAB — CBC
HCT: 46.4 % — ABNORMAL HIGH (ref 36.0–46.0)
Hemoglobin: 15.4 g/dL — ABNORMAL HIGH (ref 12.0–15.0)
MCHC: 33.2 g/dL (ref 30.0–36.0)
MCV: 96.3 fL (ref 78.0–100.0)
Platelets: 255 10*3/uL (ref 150.0–400.0)
RBC: 4.82 Mil/uL (ref 3.87–5.11)
RDW: 14.2 % (ref 11.5–15.5)
WBC: 6.7 10*3/uL (ref 4.0–10.5)

## 2023-03-31 LAB — COMPREHENSIVE METABOLIC PANEL
ALT: 10 U/L (ref 0–35)
AST: 17 U/L (ref 0–37)
Albumin: 5 g/dL (ref 3.5–5.2)
Alkaline Phosphatase: 64 U/L (ref 39–117)
BUN: 15 mg/dL (ref 6–23)
CO2: 28 meq/L (ref 19–32)
Calcium: 10.4 mg/dL (ref 8.4–10.5)
Chloride: 100 meq/L (ref 96–112)
Creatinine, Ser: 0.54 mg/dL (ref 0.40–1.20)
GFR: 85.91 mL/min (ref >60.00)
Glucose, Bld: 98 mg/dL (ref 70–99)
Potassium: 5 meq/L (ref 3.5–5.1)
Sodium: 140 meq/L (ref 135–145)
Total Bilirubin: 0.6 mg/dL (ref 0.2–1.2)
Total Protein: 7.7 g/dL (ref 6.0–8.3)

## 2023-03-31 LAB — VITAMIN D 25 HYDROXY (VIT D DEFICIENCY, FRACTURES): VITD: 78.75 ng/mL (ref 30.00–100.00)

## 2023-03-31 NOTE — Patient Instructions (Signed)
Please get the updated COVID and a tetanus booster at the pharmacy

## 2023-03-31 NOTE — Assessment & Plan Note (Signed)
BP Readings from Last 3 Encounters:  03/31/23 130/86  03/25/22 138/82  03/25/21 124/80   Controlled on amlodipine 2.5mg  and metoprolol 50mg  daily

## 2023-03-31 NOTE — Progress Notes (Signed)
Hearing Screening - Comments:: Has hearing sids. Wearing them today. Vision Screening - Comments:: October 2024

## 2023-03-31 NOTE — Assessment & Plan Note (Addendum)
Not able to gain weight---but at least has stabilized Discussed adding boost/ensure

## 2023-03-31 NOTE — Assessment & Plan Note (Signed)
None on the depakote 250/500 Will check level

## 2023-03-31 NOTE — Assessment & Plan Note (Signed)
On prolia every 6 months Vitamin D and dietary calcium

## 2023-03-31 NOTE — Assessment & Plan Note (Signed)
I have personally reviewed the Medicare Annual Wellness questionnaire and have noted 1. The patient's medical and social history 2. Their use of alcohol, tobacco or illicit drugs 3. Their current medications and supplements 4. The patient's functional ability including ADL's, fall risks, home safety risks and hearing or visual             impairment. 5. Diet and physical activities 6. Evidence for depression or mood disorders  The patients weight, height, BMI and visual acuity have been recorded in the chart I have made referrals, counseling and provided education to the patient based review of the above and I have provided the pt with a written personalized care plan for preventive services.  I have provided you with a copy of your personalized plan for preventive services. Please take the time to review along with your updated medication list.  May still get mammograms due to daughter's history No other cancer screening Flu vaccine today COVID and Td at the pharmacy Does do exercises from therapy

## 2023-03-31 NOTE — Progress Notes (Signed)
Subjective:    Patient ID: Tanya Knight, female    DOB: 05/25/1940, 82 y.o.   MRN: [id number]  HPI Here with daughter for Medicare wellness visit and follow up of chronic health conditions Reviewed advanced directives Reviewed other doctors---Dr Brasington--ophthal, Dr Barbera Setters, Dr Willadean Carol No hospitalizations or surgery in the past year Does exercises--from PT ---regularly No alcohol or tobacco Vision is okay---has reading glasses Hearing aides are really helpful No falls No depression or anhedonia Some recall issues--but no functional changes   Uses walker at home Wheelchair when out for longer distances Doesn't drive Lives with husband Daughter does shopping and another daughter does heavy cleaning Is able to cook (microwave), dishes, laundry Independent with ADLs.  Some urinary urgency--but not incontinent  Continues on the prolia--had trouble getting appointment the last time Is on vitamin D still Eats food with calcium  No seizures Same depakote doses  No chest pain or SOB No dizziness or syncope--but has to be careful not to overdue exertion (knows when to sit down) No palpitations No edema  Current Outpatient Medications on File Prior to Visit  Medication Sig Dispense Refill   acetaminophen (TYLENOL) 500 MG tablet Take 1,000 mg by mouth in the morning and at bedtime.     amLODipine (NORVASC) 2.5 MG tablet TAKE 3 TABLETS(7.5 MG) BY MOUTH DAILY 270 tablet 3   Cholecalciferol (VITAMIN D3) 50 MCG (2000 UT) CAPS Take 1 capsule by mouth in the morning and at bedtime.     divalproex (DEPAKOTE) 250 MG DR tablet TAKE 1 TABLET(250 MG) BY MOUTH DAILY 90 tablet 3   divalproex (DEPAKOTE) 500 MG DR tablet Take 1 tablet (500 mg total) by mouth at bedtime. 90 tablet 3   metoprolol succinate (TOPROL-XL) 50 MG 24 hr tablet TAKE 1 TABLET(50 MG) BY MOUTH DAILY WITH OR IMMEDIATELY FOLLOWING A MEAL 90 tablet 1   omeprazole (PRILOSEC) 20 MG capsule TAKE 1  CAPSULE(20 MG) BY MOUTH DAILY 90 capsule 0   No current facility-administered medications on file prior to visit.    Allergies  Allergen Reactions   Phenytoin     Other reaction(s): Other (See Comments) Other Reaction: Other reaction REACTION: severe reaction   Septra [Sulfamethoxazole-Trimethoprim]     Nausea, trouble eating and drinking    Past Medical History:  Diagnosis Date   Fracture of metatarsal    Repair of fractured R metatarsal --Dr Duda---4/08   GERD (gastroesophageal reflux disease)    Hypertension    Melanoma in situ ([facility]) 7/17   Dr Roseanne Kaufman   Osteoporosis    Seizure disorder [facility])    Seizures ([facility])    Stroke ([facility]) 06/2019   Vitamin D deficiency     Past Surgical History:  Procedure Laterality Date   CATARACT EXTRACTION, BILATERAL Bilateral 2014   EYE SURGERY     Obstructed tear duct   FOOT SURGERY  2008   MELANOMA EXCISION Left 11/2015   left lower leg   TEAR DUCT PROBING  10/12   temporary stent   TONSILLECTOMY AND ADENOIDECTOMY  1948    Family History  Problem Relation Age of Onset   Hypertension Mother    Heart disease Father    Breast cancer Daughter 53   Diabetes Maternal Aunt    Coronary artery disease Paternal Aunt    Breast cancer Paternal Aunt    Coronary artery disease Paternal Uncle    Heart disease Maternal Grandmother    Heart disease Maternal Grandfather    Heart disease Paternal  Grandmother    Heart disease Paternal Grandfather    Colon cancer Neg Hx    Stomach cancer Neg Hx    Pancreatic cancer Neg Hx    Rectal cancer Neg Hx     Social History   Socioeconomic History   Marital status: Married    Spouse name: Homero Fellers   Number of children: 3   Years of education: 12   Highest education level: Not on file  Occupational History   Occupation: retired Lobbyist: retired  Tobacco Use   Smoking status: Never    Passive exposure: Never   Smokeless tobacco: Never  Substance and Sexual Activity    Alcohol use: Yes    Alcohol/week: 0.0 standard drinks of alcohol    Comment: occasional   Drug use: No   Sexual activity: Yes    Birth control/protection: Post-menopausal  Other Topics Concern   Not on file  Social History Narrative   09/14/19 lives at home with husband   Regular exercise-yes---aerobics, Pilates, jogged in past (now walks)      Has living will   Husband, then one of her children, has health care POA   Would accept resuscitation but no prolonged artificial ventilation   Probably would not want tube feeds if cognitively unaware      Right Handed   Drinks 3-4 cups caffeine daily   Social Determinants of Health   Financial Resource Strain: Not on file  Food Insecurity: Not on file  Transportation Needs: Not on file  Physical Activity: Not on file  Stress: Not on file  Social Connections: Not on file  Intimate Partner Violence: Not on file   Review of Systems Appetite is okay--sparse Weight hasn't gone up Sleeps okay Wears seat belt Teeth are okay--keeps up with dentist No sig joint pains. Chronic back pain---1000mg  bid tylenol helps No heartburn or dysphagia---takes the prilosec daily Bowels move fine No suspicious skin lesions    Objective:   Physical Exam Constitutional:      Appearance: Normal appearance.  HENT:     Mouth/Throat:     Pharynx: No oropharyngeal exudate or posterior oropharyngeal erythema.  Eyes:     Conjunctiva/sclera: Conjunctivae normal.     Pupils: Pupils are equal, round, and reactive to light.  Cardiovascular:     Rate and Rhythm: Normal rate and regular rhythm.     Heart sounds: No murmur heard.    No gallop.     Comments: Faint pedal pulses Pulmonary:     Effort: Pulmonary effort is normal.     Breath sounds: Normal breath sounds. No wheezing or rales.  Abdominal:     Palpations: Abdomen is soft.     Tenderness: There is no abdominal tenderness.  Musculoskeletal:     Cervical back: Neck supple.     Right lower leg: No  edema.     Left lower leg: No edema.  Lymphadenopathy:     Cervical: No cervical adenopathy.  Skin:    Findings: No rash.  Neurological:     General: No focal deficit present.     Mental Status: She is alert and oriented to person, place, and time.     Comments: Word naming--- 6, then froze Recall 3/3  Psychiatric:        Mood and Affect: Mood normal.        Behavior: Behavior normal.            Assessment & Plan:

## 2023-04-01 LAB — VALPROIC ACID LEVEL: Valproic Acid Lvl: 79.5 mg/L (ref 50.0–100.0)

## 2023-04-23 DIAGNOSIS — H903 Sensorineural hearing loss, bilateral: Secondary | ICD-10-CM | POA: Diagnosis not present

## 2023-05-28 ENCOUNTER — Other Ambulatory Visit: Payer: Self-pay | Admitting: Internal Medicine

## 2023-05-28 DIAGNOSIS — M810 Age-related osteoporosis without current pathological fracture: Secondary | ICD-10-CM

## 2023-06-03 ENCOUNTER — Other Ambulatory Visit: Payer: Self-pay | Admitting: Internal Medicine

## 2023-06-04 ENCOUNTER — Other Ambulatory Visit: Payer: Self-pay | Admitting: Internal Medicine

## 2023-06-10 ENCOUNTER — Other Ambulatory Visit (INDEPENDENT_AMBULATORY_CARE_PROVIDER_SITE_OTHER): Payer: Medicare PPO

## 2023-06-10 DIAGNOSIS — M810 Age-related osteoporosis without current pathological fracture: Secondary | ICD-10-CM | POA: Diagnosis not present

## 2023-06-10 LAB — RENAL FUNCTION PANEL
Albumin: 4.6 g/dL (ref 3.5–5.2)
BUN: 16 mg/dL (ref 6–23)
CO2: 31 meq/L (ref 19–32)
Calcium: 10.3 mg/dL (ref 8.4–10.5)
Chloride: 99 meq/L (ref 96–112)
Creatinine, Ser: 0.66 mg/dL (ref 0.40–1.20)
GFR: 81.74 mL/min (ref >60.00)
Glucose, Bld: 93 mg/dL (ref 70–99)
Phosphorus: 4.1 mg/dL (ref 2.3–4.6)
Potassium: 5.3 meq/L — ABNORMAL HIGH (ref 3.5–5.1)
Sodium: 138 meq/L (ref 135–145)

## 2023-06-11 NOTE — Progress Notes (Signed)
Spoke to pt

## 2023-06-14 ENCOUNTER — Other Ambulatory Visit: Payer: Self-pay | Admitting: Internal Medicine

## 2023-06-17 ENCOUNTER — Ambulatory Visit: Payer: Medicare PPO

## 2023-06-17 ENCOUNTER — Telehealth: Payer: Self-pay

## 2023-06-17 NOTE — Telephone Encounter (Signed)
 Patient was scheduled by E2C2 for Prolia  today. I advised patient that we have not received any insurance verification for benefits. Apologized to patient and daughter in law (who drove from [place] to bring her to appointment today.) Advised that I will send request for authorization and will reach out as soon as possible to schedule. Please let me know once auth received.

## 2023-06-25 ENCOUNTER — Telehealth: Payer: Self-pay | Admitting: Internal Medicine

## 2023-06-25 NOTE — Telephone Encounter (Signed)
Copied from CRM [id number]. Topic: General - Other >> Jun 25, 2023  2:37 PM Prudencio Pair wrote: Reason for CRM: Patient's son, Homero Fellers, calling to get a follow up on Prolia shot that was suppose to be scheduled by Joellen. Called CAL & was told that Park Meo is waiting to hear back from authorization dept & she will then reach out to Kamaili. He states his mom has received a letter from [facility] stating that she is approved but she was turned away last week after she was scheduled for the shot. He states she has already gone through the testing & wants to know what's the problem. He is frustrated. Please give him a call to further answer his questions and concerns. CB #: [phone].

## 2023-06-26 ENCOUNTER — Telehealth: Payer: Self-pay

## 2023-06-26 ENCOUNTER — Other Ambulatory Visit: Payer: Self-pay

## 2023-06-26 ENCOUNTER — Other Ambulatory Visit (HOSPITAL_COMMUNITY): Payer: Self-pay

## 2023-06-26 DIAGNOSIS — M810 Age-related osteoporosis without current pathological fracture: Secondary | ICD-10-CM

## 2023-06-26 MED ORDER — DENOSUMAB 60 MG/ML ~~LOC~~ SOSY
60.0000 mg | PREFILLED_SYRINGE | Freq: Once | SUBCUTANEOUS | Status: AC
  Administered 2023-07-06: 60 mg via SUBCUTANEOUS

## 2023-06-26 NOTE — Telephone Encounter (Signed)
I have sent a teams to Ronald Pippins to follow up. I have called son on dpr and let him know I will reach out as soon as I hear back. I have sent another message to follow up on auth.

## 2023-06-26 NOTE — Telephone Encounter (Signed)
Spoke to patient CAM order placed as requested. Will check status later today and reach out to family.

## 2023-06-26 NOTE — Telephone Encounter (Signed)
Pharmacy Patient Advocate Encounter   Received notification from  [facility] Portal that prior authorization for PROLIA is required/requested.   Insurance verification completed.   The patient is insured through [employer] [place] .   Per test claim: PA required; PA submitted to above mentioned insurance via CoverMyMeds Key/confirmation #/EOC [facility] Status is pending

## 2023-06-26 NOTE — Telephone Encounter (Signed)
Tanya Knight

## 2023-06-26 NOTE — Telephone Encounter (Signed)
Pharmacy Patient Advocate Encounter  Received notification from [facility] that Prior Authorization for PROLIA has been APPROVED from 04/12/21 to 05/11/24. Ran test claim, Copay is $64. This test claim was processed through [facility]- copay amounts may vary at other pharmacies due to pharmacy/plan contracts, or as the patient moves through the different stages of their insurance plan.   PA #/Case ID/Reference #: [id number]

## 2023-06-26 NOTE — Telephone Encounter (Signed)
Pt ready for scheduling for PROLIA on or after : 06/26/23  Out-of-pocket cost due at time of visit: $40  Number of injection/visits approved: 2  Primary: [employer] Prolia co-insurance: $40 Admin fee co-insurance: 0%  Secondary: --- Prolia co-insurance:  Admin fee co-insurance:   Medical Benefit Details: Date Benefits were checked: 06/26/23 Deductible: NO/ Coinsurance: $40/ Admin Fee: 0%  Prior Auth: APPROVED PA# [id number] Expiration Date: 04/12/21-05/11/24  # of doses approved: 2  Pharmacy benefit: Copay $64 If patient wants fill through the pharmacy benefit please send prescription to: [employer], and include estimated need by date in rx notes. Pharmacy will ship medication directly to the office.  Patient NOT eligible for Prolia Copay Card. Copay Card can make patient's cost as little as $25. Link to apply: [email or web address]://www.amgensupportplus.[email or web address]/copay  ** This summary of benefits is an estimation of the patient's out-of-pocket cost. Exact cost may very based on individual plan coverage.

## 2023-06-26 NOTE — Telephone Encounter (Signed)
Prolia VOB initiated via [email or web address]  Next Prolia inj DUE: NOW

## 2023-06-30 ENCOUNTER — Other Ambulatory Visit: Payer: Self-pay | Admitting: Internal Medicine

## 2023-06-30 DIAGNOSIS — Z1231 Encounter for screening mammogram for malignant neoplasm of breast: Secondary | ICD-10-CM

## 2023-07-03 ENCOUNTER — Telehealth: Payer: Self-pay

## 2023-07-03 NOTE — Telephone Encounter (Signed)
 Patient has been scheduled for appointment.

## 2023-07-06 ENCOUNTER — Ambulatory Visit (INDEPENDENT_AMBULATORY_CARE_PROVIDER_SITE_OTHER): Payer: Medicare PPO

## 2023-07-06 ENCOUNTER — Telehealth: Payer: Self-pay

## 2023-07-06 ENCOUNTER — Telehealth: Payer: Self-pay | Admitting: Internal Medicine

## 2023-07-06 ENCOUNTER — Ambulatory Visit: Payer: Medicare PPO

## 2023-07-06 DIAGNOSIS — M8000XA Age-related osteoporosis with current pathological fracture, unspecified site, initial encounter for fracture: Secondary | ICD-10-CM

## 2023-07-06 DIAGNOSIS — M810 Age-related osteoporosis without current pathological fracture: Secondary | ICD-10-CM | POA: Diagnosis not present

## 2023-07-06 MED ORDER — DENOSUMAB 60 MG/ML ~~LOC~~ SOSY: 60.0000 mg | PREFILLED_SYRINGE | Freq: Once | SUBCUTANEOUS | Status: AC

## 2023-07-06 NOTE — Telephone Encounter (Signed)
 That is because he is retiring January 11, 2024

## 2023-07-06 NOTE — Progress Notes (Signed)
 Per orders of Dr. Tillman Abide, injection of Prolia  given by Donnamarie Poag in left arm. Patient tolerated injection well. Informed patient we will reach out when due for next injection in 6 months.

## 2023-07-06 NOTE — Telephone Encounter (Signed)
 lvm for pt to call office to schedule appt for  physical

## 2023-07-06 NOTE — Telephone Encounter (Signed)
 Patient in office today for prolia. Would like call back to get wellness exam set up. She will need in November. Advised office will reach out to schedule.

## 2023-07-06 NOTE — Telephone Encounter (Signed)
 Added patient to recall list for Dr. Alphonsus Sias. LVM for patient to cb and schedule.

## 2023-07-06 NOTE — Telephone Encounter (Signed)
 Copied from CRM [id number]. Topic: General - Other >> Jul 06, 2023  2:01 PM Eunice Blase wrote: Reason for CRM: Pt called to schedule for annual physical but software would not allow me to schedule for November 2025. Please call pt [phone]

## 2023-07-10 NOTE — Telephone Encounter (Signed)
Error

## 2023-08-25 ENCOUNTER — Ambulatory Visit
Admission: RE | Admit: 2023-08-25 | Discharge: 2023-08-25 | Disposition: A | Discharge status: Home / Self Care | Payer: Medicare PPO | Source: Ambulatory Visit | Attending: Internal Medicine | Admitting: Internal Medicine

## 2023-08-25 DIAGNOSIS — Z1231 Encounter for screening mammogram for malignant neoplasm of breast: Secondary | ICD-10-CM | POA: Diagnosis not present

## 2023-10-29 DIAGNOSIS — H903 Sensorineural hearing loss, bilateral: Secondary | ICD-10-CM | POA: Diagnosis not present

## 2023-11-26 ENCOUNTER — Other Ambulatory Visit: Payer: Self-pay | Admitting: Internal Medicine

## 2023-12-03 ENCOUNTER — Telehealth: Payer: Self-pay

## 2023-12-03 ENCOUNTER — Telehealth: Payer: Self-pay | Admitting: Internal Medicine

## 2023-12-03 NOTE — Telephone Encounter (Signed)
 Prolia  VOB initiated via [email or web address]  Next Prolia  inj DUE: 01/03/24

## 2023-12-03 NOTE — Telephone Encounter (Deleted)
 SABRA

## 2023-12-03 NOTE — Telephone Encounter (Signed)
 Copied from CRM [id number]. Topic: General - Call Back - No Documentation >> Dec 03, 2023  9:28 AM Tanya Knight wrote: Reason for CRM: Pt would like to speak with joellen regarding her prolia  shot [phone]

## 2023-12-04 ENCOUNTER — Encounter: Payer: Self-pay | Admitting: Internal Medicine

## 2023-12-07 ENCOUNTER — Other Ambulatory Visit (HOSPITAL_COMMUNITY): Payer: Self-pay

## 2023-12-07 NOTE — Telephone Encounter (Signed)
 Pt ready for scheduling for PROLIA  on or after : 01/03/24  Option# 1: Buy/Bill (Office supplied medication)  Out-of-pocket cost due at time of clinic visit: $357  Number of injection/visits approved: 2  Primary: [employer] Prolia  co-insurance: 20% Admin fee co-insurance: 20%  Secondary: --- Prolia  co-insurance:  Admin fee co-insurance:   Medical Benefit Details: Date Benefits were checked: 12/03/23 Deductible: NO/ Coinsurance: 20%/ Admin Fee: 20%  Prior Auth: APPROVED PA# [id number] Expiration Date: 04/12/21-05/11/24  # of doses approved: 2 ----------------------------------------------------------------------- Option# 2- Med Obtained from pharmacy:  Pharmacy benefit: Copay $64 (Paid to pharmacy) Admin Fee: 20% (Pay at clinic)  Prior Auth: N/A PA# Expiration Date:   # of doses approved:   If patient wants fill through the pharmacy benefit please send prescription to: [facility], and include estimated need by date in rx notes. Pharmacy will ship medication directly to the office.  Patient NOT eligible for Prolia  Copay Card. Copay Card can make patient's cost as little as $25. Link to apply: https://www.amgensupportplus.com/copay  ** This summary of benefits is an estimation of the patient's out-of-pocket cost. Exact cost may very based on individual plan coverage.

## 2023-12-07 NOTE — Telephone Encounter (Signed)
 SABRA

## 2023-12-08 ENCOUNTER — Other Ambulatory Visit: Payer: Self-pay | Admitting: *Deleted

## 2023-12-08 ENCOUNTER — Other Ambulatory Visit (HOSPITAL_COMMUNITY): Payer: Self-pay

## 2023-12-08 DIAGNOSIS — M8000XA Age-related osteoporosis with current pathological fracture, unspecified site, initial encounter for fracture: Secondary | ICD-10-CM

## 2023-12-08 MED ORDER — DENOSUMAB 60 MG/ML ~~LOC~~ SOSY
60.0000 mg | PREFILLED_SYRINGE | Freq: Once | SUBCUTANEOUS | 0 refills | Status: AC
  Filled 2023-12-08: qty 1, 1d supply, fill #0
  Filled 2023-12-22: qty 1, 180d supply, fill #0

## 2023-12-08 NOTE — Telephone Encounter (Signed)
 Please see prolia  referral.

## 2023-12-08 NOTE — Telephone Encounter (Signed)
 Duplicate message. Please see encounter 12/08/23.

## 2023-12-08 NOTE — Telephone Encounter (Signed)
 Copied from CRM [id number]. Topic: Clinical - Medication Question >> Dec 08, 2023  9:55 AM Revonda D wrote: Reason for CRM: Pt is calling to check the status of her PA for the prolia  shot. I informed the pt that the PA was approved but someone from the office should reach out today to provide her with the options for the prolia  shot.

## 2023-12-09 ENCOUNTER — Other Ambulatory Visit (HOSPITAL_COMMUNITY): Payer: Self-pay

## 2023-12-09 ENCOUNTER — Other Ambulatory Visit: Payer: Self-pay

## 2023-12-09 ENCOUNTER — Other Ambulatory Visit: Payer: Self-pay | Admitting: Pharmacy Technician

## 2023-12-09 NOTE — Progress Notes (Signed)
 Pharmacy Patient Advocate Encounter  Insurance verification completed.   The patient is insured through [employer]   Ran test claim for Prolia. Co-pay is $64.  This test claim was processed through [facility]- copay amounts may vary at other pharmacies due to pharmacy/plan contracts, or as the patient moves through the different stages of their insurance plan.

## 2023-12-18 ENCOUNTER — Other Ambulatory Visit: Payer: Self-pay

## 2023-12-21 ENCOUNTER — Other Ambulatory Visit: Payer: Self-pay

## 2023-12-22 ENCOUNTER — Other Ambulatory Visit: Payer: Self-pay

## 2023-12-22 ENCOUNTER — Other Ambulatory Visit (HOSPITAL_COMMUNITY): Payer: Self-pay

## 2023-12-22 NOTE — Progress Notes (Signed)
 Specialty Pharmacy Initial Fill Coordination Note  Tanya Knight is a 83 y.o. female contacted today regarding initial fill of specialty medication(s) Denosumab  (PROLIA )   Patient requested Courier to Provider Office   Delivery date: 12/31/23   Verified address: [facility] [place], [place] [address]   Medication will be filled on 8/20.   Patient is aware of $64 copayment.

## 2023-12-23 ENCOUNTER — Other Ambulatory Visit: Payer: Self-pay

## 2023-12-29 ENCOUNTER — Other Ambulatory Visit

## 2023-12-29 ENCOUNTER — Other Ambulatory Visit (INDEPENDENT_AMBULATORY_CARE_PROVIDER_SITE_OTHER)

## 2023-12-29 DIAGNOSIS — M8000XA Age-related osteoporosis with current pathological fracture, unspecified site, initial encounter for fracture: Secondary | ICD-10-CM | POA: Diagnosis not present

## 2023-12-30 ENCOUNTER — Other Ambulatory Visit: Payer: Self-pay

## 2023-12-30 ENCOUNTER — Ambulatory Visit: Payer: Self-pay | Admitting: Internal Medicine

## 2023-12-30 LAB — BASIC METABOLIC PANEL WITH GFR
BUN: 16 mg/dL (ref 6–23)
CO2: 29 meq/L (ref 19–32)
Calcium: 9 mg/dL (ref 8.4–10.5)
Chloride: 104 meq/L (ref 96–112)
Creatinine, Ser: 0.56 mg/dL (ref 0.40–1.20)
GFR: 84.71 mL/min (ref >60.00)
Glucose, Bld: 92 mg/dL (ref 70–99)
Potassium: 4.3 meq/L (ref 3.5–5.1)
Sodium: 140 meq/L (ref 135–145)

## 2024-01-05 ENCOUNTER — Ambulatory Visit (INDEPENDENT_AMBULATORY_CARE_PROVIDER_SITE_OTHER)

## 2024-01-05 DIAGNOSIS — M8000XA Age-related osteoporosis with current pathological fracture, unspecified site, initial encounter for fracture: Secondary | ICD-10-CM | POA: Diagnosis not present

## 2024-01-05 MED ORDER — DENOSUMAB 60 MG/ML ~~LOC~~ SOSY
60.0000 mg | PREFILLED_SYRINGE | Freq: Once | SUBCUTANEOUS | Status: AC
  Administered 2024-01-05: 60 mg via SUBCUTANEOUS

## 2024-01-05 MED ORDER — DENOSUMAB 60 MG/ML ~~LOC~~ SOSY: 60.0000 mg | PREFILLED_SYRINGE | Freq: Once | SUBCUTANEOUS | Status: AC

## 2024-01-05 NOTE — Progress Notes (Signed)
 Per orders of Dr. Richard Letvak, injection of prolia  60 mg [place] given by Laray Arenas in right deltoid. Patient tolerated injection well. Patient will make appointment for 6 month.

## 2024-01-06 DIAGNOSIS — D2261 Melanocytic nevi of right upper limb, including shoulder: Secondary | ICD-10-CM | POA: Diagnosis not present

## 2024-01-06 DIAGNOSIS — Z85820 Personal history of malignant melanoma of skin: Secondary | ICD-10-CM | POA: Diagnosis not present

## 2024-01-06 DIAGNOSIS — D225 Melanocytic nevi of trunk: Secondary | ICD-10-CM | POA: Diagnosis not present

## 2024-01-06 DIAGNOSIS — Z85828 Personal history of other malignant neoplasm of skin: Secondary | ICD-10-CM | POA: Diagnosis not present

## 2024-01-06 DIAGNOSIS — D2262 Melanocytic nevi of left upper limb, including shoulder: Secondary | ICD-10-CM | POA: Diagnosis not present

## 2024-01-06 DIAGNOSIS — D2271 Melanocytic nevi of right lower limb, including hip: Secondary | ICD-10-CM | POA: Diagnosis not present

## 2024-01-06 DIAGNOSIS — L853 Xerosis cutis: Secondary | ICD-10-CM | POA: Diagnosis not present

## 2024-01-11 ENCOUNTER — Other Ambulatory Visit: Payer: Self-pay | Admitting: Internal Medicine

## 2024-01-14 ENCOUNTER — Other Ambulatory Visit (HOSPITAL_COMMUNITY): Payer: Self-pay

## 2024-02-24 ENCOUNTER — Other Ambulatory Visit: Payer: Self-pay

## 2024-02-24 MED ORDER — METOPROLOL SUCCINATE ER 50 MG PO TB24: 50.0000 mg | ORAL_TABLET | Freq: Every day | ORAL | 0 refills | Status: DC

## 2024-03-31 ENCOUNTER — Ambulatory Visit

## 2024-03-31 VITALS — BP 132/88 | HR 64 | Temp 97.4°F | Ht 60.25 in | Wt 119.0 lb

## 2024-03-31 DIAGNOSIS — E785 Hyperlipidemia, unspecified: Secondary | ICD-10-CM | POA: Diagnosis not present

## 2024-03-31 DIAGNOSIS — K219 Gastro-esophageal reflux disease without esophagitis: Secondary | ICD-10-CM

## 2024-03-31 DIAGNOSIS — M810 Age-related osteoporosis without current pathological fracture: Secondary | ICD-10-CM

## 2024-03-31 DIAGNOSIS — G40909 Epilepsy, unspecified, not intractable, without status epilepticus: Secondary | ICD-10-CM | POA: Diagnosis not present

## 2024-03-31 DIAGNOSIS — I10 Essential (primary) hypertension: Secondary | ICD-10-CM

## 2024-03-31 DIAGNOSIS — R2681 Unsteadiness on feet: Secondary | ICD-10-CM | POA: Insufficient documentation

## 2024-03-31 DIAGNOSIS — Z8673 Personal history of transient ischemic attack (TIA), and cerebral infarction without residual deficits: Secondary | ICD-10-CM | POA: Diagnosis not present

## 2024-03-31 DIAGNOSIS — Z23 Encounter for immunization: Secondary | ICD-10-CM | POA: Diagnosis not present

## 2024-03-31 LAB — COMPREHENSIVE METABOLIC PANEL WITH GFR
ALT: 9 U/L (ref 0–35)
AST: 16 U/L (ref 0–37)
Albumin: 4.9 g/dL (ref 3.5–5.2)
Alkaline Phosphatase: 54 U/L (ref 39–117)
BUN: 20 mg/dL (ref 6–23)
CO2: 31 meq/L (ref 19–32)
Calcium: 10.3 mg/dL (ref 8.4–10.5)
Chloride: 101 meq/L (ref 96–112)
Creatinine, Ser: 0.61 mg/dL (ref 0.40–1.20)
GFR: 82.84 mL/min (ref >60.00)
Glucose, Bld: 99 mg/dL (ref 70–99)
Potassium: 5.2 meq/L — ABNORMAL HIGH (ref 3.5–5.1)
Sodium: 140 meq/L (ref 135–145)
Total Bilirubin: 0.6 mg/dL (ref 0.2–1.2)
Total Protein: 7.5 g/dL (ref 6.0–8.3)

## 2024-03-31 MED ORDER — ATORVASTATIN CALCIUM 20 MG PO TABS: 20.0000 mg | ORAL_TABLET | Freq: Every day | ORAL | 3 refills | Status: AC

## 2024-03-31 MED ORDER — CALCIUM-VITAMIN D3 600-12.5 MG-MCG PO CAPS: 2.0000 | ORAL_CAPSULE | Freq: Every day | ORAL | 3 refills | Status: AC

## 2024-03-31 NOTE — Patient Instructions (Addendum)
 Thank you for visiting [facility] today! Here's what we talked about: - START Calcium Vit D supplement, calcitrate. Total calcium - 1200mg , total vit D - 1000 units to 2000 units  - Only use Prilosec as needed, if daily symptoms, Try tums/peopto-bismol foirst, only prilosec if they don't work.

## 2024-03-31 NOTE — Progress Notes (Unsigned)
 Subjective:   This visit was conducted in person. The patient gave informed consent to the use of Abridge AI technology to record the contents of the encounter as documented below.   Patient ID: Tanya Knight, female    DOB: Mar 22, 1941, 83 y.o.   MRN: [id number]   Discussed the use of AI scribe software for clinical note transcription with the patient, who gave verbal consent to proceed.  History of Present Illness Tanya Knight is an 83 year old female with a history of stroke who presents for a follow-up visit.  She experienced a stroke in January 2021, resulting in an unsteady gait. Since then, she has been using a wheeled walker for mobility and has not had any falls in the past three months. She continues to perform exercises to maintain her strength. She lives with her husband, who also uses a walker.  She has a history of high cholesterol, with her last cholesterol lab in 2021 showing elevated levels. She is not currently on any cholesterol medication and was unaware of her high cholesterol status.  She experienced a seizure in 1997 and another a few years later. Post-stroke, she had an episode of blacking out, which she attributes to heat, but has had no recent episodes. She takes Depakote  for seizure disorder, with a regimen of 250 mg in the morning and 500 mg at night. Her last valproic acid  level was checked a year ago.  She has low bone density and receives Prolia  injections every six months, with the next dose due in February. She is not on a calcium  supplement but consumes calcium -rich foods like cottage cheese. She takes a vitamin D  supplement.  She is on metoprolol  50 mg daily and amlodipine  2.5 mg for blood pressure management. She reports no history of heart issues but mentions a chronic bruit in her heart. She also takes Tylenol  as needed for back pain, which she uses regularly since her stroke.  She takes omeprazole  daily for acid reflux but reports no current symptoms  of reflux. She has been on this medication since late 2023.   Review of Systems  All other systems reviewed and are negative.       Allergies  Allergen Reactions   Phenytoin     Other reaction(s): Other (See Comments) Other Reaction: Other reaction REACTION: severe reaction   Septra  [Sulfamethoxazole -Trimethoprim ]     Nausea, trouble eating and drinking    Current Outpatient Medications on File Prior to Visit  Medication Sig Dispense Refill   acetaminophen  (TYLENOL ) 500 MG tablet Take 1,000 mg by mouth in the morning and at bedtime.     amLODipine  (NORVASC ) 2.5 MG tablet TAKE 3 TABLETS(7.5 MG) BY MOUTH DAILY 270 tablet 3   Cholecalciferol (VITAMIN D3) 50 MCG (2000 UT) CAPS Take 1 capsule by mouth in the morning and at bedtime.     divalproex  (DEPAKOTE ) 250 MG DR tablet TAKE 1 TABLET(250 MG) BY MOUTH DAILY 90 tablet 0   divalproex  (DEPAKOTE ) 500 MG DR tablet TAKE 1 TABLET(500 MG) BY MOUTH AT BEDTIME 90 tablet 3   metoprolol  succinate (TOPROL -XL) 50 MG 24 hr tablet Take 1 tablet (50 mg total) by mouth daily. Take with or immediately following a meal. 90 tablet 0   omeprazole  (PRILOSEC) 20 MG capsule TAKE 1 CAPSULE(20 MG) BY MOUTH DAILY 90 capsule 3   Current Facility-Administered Medications on File Prior to Visit  Medication Dose Route Frequency Provider Last Rate Last Admin   denosumab  (PROLIA ) injection 60 mg  60 mg Subcutaneous Once Letvak, Richard I, MD       NOREEN ON 07/07/2024] denosumab  (PROLIA ) injection 60 mg  60 mg Subcutaneous Once Letvak, Richard I, MD        BP 132/88 (BP Location: Left Arm, Patient Position: Sitting, Cuff Size: Normal)   Pulse 64   Temp (!) 97.4 F (36.3 C) (Oral)   Ht 5' 0.25 (1.53 m)   Wt 119 lb (54 kg)   SpO2 98%   BMI 23.05 kg/m   Objective:      Physical Exam GENERAL: Alert, cooperative, well developed, no acute distress. HEAD: Normocephalic atraumatic. EYES: Extraocular movements intact BL, pupils round, equal and reactive to  light BL, conjunctivae normal BL. EXTREMITIES: No cyanosis or edema. NEUROLOGICAL: Oriented to person, place and time, ambulates with wheeled walker, moves all extremities without gross motor or sensory deficit.       Assessment & Plan:   Assessment & Plan Osteoporosis Managed with Prolia . No fractures. Not on calcium  supplement, necessary due to Prolia 's side effect of low calcium , as well as for improving bone mineralization. Discussed calcium 's importance and potential constipation from supplements.  - Ordered calcium  ionized level. - Started calcium  and vitamin D  supplement, two capsules daily.  Hx CVA Essential hypertension Well controlled with metoprolol  and amlodipine , goal is less than 130/90 given hx of post-traumatic subdural hematoma several mo after the stroke. No cardiac hx, Discussed metoprolol 's appropriateness for hypertension without heart issues. She prefers current regimen. Chronic unsteady gait since stroke in January 2021. Uses wheeled walker for stability. No falls in the past three months.  Not currently on daily ASA due to subdural hematoma in 2021, decision was made not to restart. Will continue to minimize risk of CVA recurrence through mitigation of HTN and HLD risk factors.  - Continue Metoprolol  50mg  daily and Amlodipine7.5mg  daily - Continue using wheeled walker for stability.  Hyperlipidemia Risk factor for stroke. No recent cholesterol medication. Last cholesterol level elevated in 2021. Discussed importance of cholesterol management to prevent stroke recurrence.  - Ordered fasting cholesterol level. - Started Lipitor 20 mg daily.  Seizure disorder Managed with Depakote . No seizures since 2021. Current regimen: 250 mg in the AM, 500 mg in PM. Last valproic acid  level was good a year ago.  - Ordered valproic acid  level. - Ordered liver function tests to monitor for potential liver injury from Depakote .  Gastroesophageal reflux disease Long-term  omeprazole  use since 2020. Discussed risks of long-term use, including bone density effects and C diff  infections. Recommended as-needed use. - Discontinued daily omeprazole . - Use omeprazole  as needed for reflux symptoms. - Consider alternatives like Tums or Pepto Bismol for symptom management.   Return in about 3 weeks (around 04/21/2024). For CPE  Reuben MARLA Burkes, MD  03/31/24     Contains text generated by Abridge.

## 2024-04-01 ENCOUNTER — Ambulatory Visit: Payer: Self-pay

## 2024-04-02 LAB — VALPROIC ACID LEVEL: Valproic Acid Lvl: 71.3 mg/L (ref 50.0–100.0)

## 2024-04-02 LAB — CALCIUM, IONIZED: Calcium, Ion: 5.5 mg/dL (ref 4.7–5.5)

## 2024-04-11 ENCOUNTER — Ambulatory Visit

## 2024-04-11 VITALS — Ht 60.0 in | Wt 119.0 lb

## 2024-04-11 DIAGNOSIS — Z0000 Encounter for general adult medical examination without abnormal findings: Secondary | ICD-10-CM | POA: Diagnosis not present

## 2024-04-11 NOTE — Progress Notes (Signed)
 Chief Complaint  Patient presents with   Medicare Wellness     Subjective:   Tanya Knight is a 83 y.o. female who presents for a Medicare Annual Wellness Visit.  I connected with  DEMARIS BOUSQUET on 04/11/24 by a audio enabled telemedicine application and verified that I am speaking with the correct person using two identifiers.  Patient Location: Home  Provider Location: Office/Clinic  Persons Participating in Visit: Patient.  I discussed the limitations of evaluation and management by telemedicine. The patient expressed understanding and agreed to proceed.  Vital Signs: Because this visit was a virtual/telehealth visit, some criteria may be missing or patient reported. Any vitals not documented were not able to be obtained and vitals that have been documented are patient reported.   Allergies (verified) Phenytoin and Septra  [sulfamethoxazole -trimethoprim ]   History: Past Medical History:  Diagnosis Date   Fracture of metatarsal    Repair of fractured R metatarsal --Dr Duda---4/08   GERD (gastroesophageal reflux disease)    Hypertension    Melanoma in situ (HCC) 7/17   Dr Chrystie   Osteoporosis    Seizure disorder (HCC)    Seizures (HCC)    Stroke (HCC) 06/2019   Vitamin D  deficiency    Past Surgical History:  Procedure Laterality Date   CATARACT EXTRACTION, BILATERAL Bilateral 2014   EYE SURGERY     Obstructed tear duct   FOOT SURGERY  2008   MELANOMA EXCISION Left 11/2015   left lower leg   TEAR DUCT PROBING  10/12   temporary stent   TONSILLECTOMY AND ADENOIDECTOMY  1948   Family History  Problem Relation Age of Onset   Hypertension Mother    Heart disease Father    Breast cancer Daughter 74   Diabetes Maternal Aunt    Coronary artery disease Paternal Aunt    Breast cancer Paternal Aunt    Coronary artery disease Paternal Uncle    Heart disease Maternal Grandmother    Heart disease Maternal Grandfather    Heart disease Paternal Grandmother     Heart disease Paternal Grandfather    Colon cancer Neg Hx    Stomach cancer Neg Hx    Pancreatic cancer Neg Hx    Rectal cancer Neg Hx    Social History   Occupational History   Occupation: retired Lobbyist: retired  Tobacco Use   Smoking status: Never    Passive exposure: Never   Smokeless tobacco: Never  Substance and Sexual Activity   Alcohol use: Yes    Alcohol/week: 0.0 standard drinks of alcohol    Comment: occasional   Drug use: No   Sexual activity: Yes    Birth control/protection: Post-menopausal   Tobacco Counseling Counseling given: Not Answered  SDOH Screenings   Food Insecurity: No Food Insecurity (04/11/2024)  Housing: Unknown (04/11/2024)  Transportation Needs: No Transportation Needs (04/11/2024)  Utilities: Not At Risk (04/11/2024)  Depression (PHQ2-9): Low Risk  (04/11/2024)  Physical Activity: Insufficiently Active (04/11/2024)  Social Connections: Socially Integrated (04/11/2024)  Stress: No Stress Concern Present (04/11/2024)  Tobacco Use: Low Risk  (04/11/2024)  Health Literacy: Adequate Health Literacy (04/11/2024)   See flowsheets for full screening details  Depression Screen PHQ 2 & 9 Depression Scale- Over the past 2 weeks, how often have you been bothered by any of the following problems? Little interest or pleasure in doing things: 0 Feeling down, depressed, or hopeless (PHQ Adolescent also includes...irritable): 0 PHQ-2 Total Score: 0 Trouble falling  or staying asleep, or sleeping too much: 0 Feeling tired or having little energy: 0 Poor appetite or overeating (PHQ Adolescent also includes...weight loss): 0 Feeling bad about yourself - or that you are a failure or have let yourself or your family down: 0 Trouble concentrating on things, such as reading the newspaper or watching television (PHQ Adolescent also includes...like school work): 3 (uses a walker) Moving or speaking so slowly that other people could have noticed. Or  the opposite - being so fidgety or restless that you have been moving around a lot more than usual: 0 Thoughts that you would be better off dead, or of hurting yourself in some way: 0 PHQ-9 Total Score: 3 If you checked off any problems, how difficult have these problems made it for you to do your work, take care of things at home, or get along with other people?: Not difficult at all  Depression Treatment Depression Interventions/Treatment : EYV7-0 Score <4 Follow-up Not Indicated     Goals Addressed               This Visit's Progress     Patient Stated (pt-stated)        Patient stated she plans to continue managing her health since having the stroke (21') and exercises       Visit info / Clinical Intake: Medicare Wellness Visit Type:: Subsequent Annual Wellness Visit Persons participating in visit:: patient Medicare Wellness Visit Mode:: Telephone If telephone:: video declined Because this visit was a virtual/telehealth visit:: vitals recorded from last visit If Telephone or Video please confirm:: I connected with the patient using audio/video enabled telemedicine application and verified that I am speaking with the correct person using two identifiers; I discussed the limitations of evaluation and management by telemedicine Patient Location:: Home Provider Location:: Office Information given by:: patient Interpreter Needed?: No Pre-visit prep was completed: no AWV questionnaire completed by patient prior to visit?: no Living arrangements:: lives with spouse/significant other Patient's Overall Health Status Rating: good Typical amount of pain: none Does pain affect daily life?: no Are you currently prescribed opioids?: no  Dietary Habits and Nutritional Risks How many meals a day?: 3 Eats fruit and vegetables daily?: yes Most meals are obtained by: preparing own meals In the last 2 weeks, have you had any of the following?: none Diabetic:: no  Functional  Status Activities of Daily Living (to include ambulation/medication): Independent Ambulation: Independent with device- listed below Home Assistive Devices/Equipment: Eyeglasses; Walker (specify Type); [place] (wears hearing aids) Medication Administration: Independent Home Management: Independent Manage your own finances?: yes Primary transportation is: family/friends Concerns about vision?: no *vision screening is required for WTM* Concerns about hearing?: (!) yes Uses hearing aids?: (!) yes Hear whispered voice?: yes  Fall Screening Falls in the past year?: 0 Number of falls in past year: 0 Was there an injury with Fall?: 0 Fall Risk Category Calculator: 0 Patient Fall Risk Level: Low Fall Risk  Fall Risk Patient at Risk for Falls Due to: Impaired balance/gait Fall risk Follow up: Falls evaluation completed; Falls prevention discussed  Home and Transportation Safety: All rugs have non-skid backing?: N/A, no rugs All stairs or steps have railings?: N/A, no stairs (outside) Grab bars in the bathtub or shower?: yes Have non-skid surface in bathtub or shower?: yes Good home lighting?: yes Regular seat belt use?: yes Hospital stays in the last year:: no  Cognitive Assessment Difficulty concentrating, remembering, or making decisions? : no Will 6CIT or Mini Cog be Completed: yes  What year is it?: 0 points What month is it?: 0 points Give patient an address phrase to remember (5 components): [address] [place], [place] About what time is it?: 0 points Count backwards from 20 to 1: 0 points Say the months of the year in reverse: 0 points Repeat the address phrase from earlier: 0 points 6 CIT Score: 0 points  Advance Directives (For Healthcare) Does Patient Have a Medical Advance Directive?: Yes Does patient want to make changes to medical advance directive?: Yes (Inpatient - patient requests chaplain consult to change a medical advance directive) Type of Advance Directive:  Healthcare Power of [place]; Living will Copy of Healthcare Power of Attorney in Chart?: No - copy requested Copy of Living Will in Chart?: No - copy requested  Reviewed/Updated  Reviewed/Updated: Reviewed All (Medical, Surgical, Family, Medications, Allergies, Care Teams, Patient Goals)        Objective:    Today's Vitals   04/11/24 1008  Weight: 119 lb (54 kg)  Height: 5' (1.524 m)   Body mass index is 23.24 kg/m.  Current Medications (verified) Outpatient Encounter Medications as of 04/11/2024  Medication Sig   acetaminophen  (TYLENOL ) 500 MG tablet Take 1,000 mg by mouth in the morning and at bedtime.   amLODipine  (NORVASC ) 2.5 MG tablet TAKE 3 TABLETS(7.5 MG) BY MOUTH DAILY   atorvastatin  (LIPITOR) 20 MG tablet Take 1 tablet (20 mg total) by mouth daily.   Calcium  Carb-Cholecalciferol (CALCIUM -VITAMIN D3) 600-12.5 MG-MCG CAPS Take 2 capsules by mouth daily.   Cholecalciferol (VITAMIN D3) 50 MCG (2000 UT) CAPS Take 1 capsule by mouth in the morning and at bedtime.   divalproex  (DEPAKOTE ) 250 MG DR tablet TAKE 1 TABLET(250 MG) BY MOUTH DAILY   divalproex  (DEPAKOTE ) 500 MG DR tablet TAKE 1 TABLET(500 MG) BY MOUTH AT BEDTIME   metoprolol  succinate (TOPROL -XL) 50 MG 24 hr tablet Take 1 tablet (50 mg total) by mouth daily. Take with or immediately following a meal.   omeprazole  (PRILOSEC) 20 MG capsule TAKE 1 CAPSULE(20 MG) BY MOUTH DAILY   Facility-Administered Encounter Medications as of 04/11/2024  Medication   denosumab  (PROLIA ) injection 60 mg   [START ON 07/07/2024] denosumab  (PROLIA ) injection 60 mg   Hearing/Vision screen Hearing Screening - Comments:: Wears hearing aids Vision Screening - Comments:: Wears eyeglasses for reading - up to date with routine eye exams with Dr Mittie Immunizations and Health Maintenance Health Maintenance  Topic Date Due   Cervical Cancer Screening (Pap smear)  03/31/2025 (Originally 02/07/1962)   COVID-19 Vaccine (8 - 2025-26 season)  03/31/2025 (Originally 01/11/2024)   Colonoscopy  04/11/2025 (Originally 09/10/2018)   Medicare Annual Wellness (AWV)  04/11/2025   Mammogram  08/24/2025   DTaP/Tdap/Td (4 - Td or Tdap) 05/06/2033   Pneumococcal Vaccine: 50+ Years  Completed   Influenza Vaccine  Completed   Bone Density Scan  Completed   Zoster Vaccines- Shingrix  Completed   Meningococcal B Vaccine  Aged Out   Hepatitis B Vaccines 19-59 Average Risk  Discontinued        Assessment/Plan:  This is a routine wellness examination for Alexis.  Patient Care Team: Bennett Reuben POUR, MD as PCP - General (Family Medicine) Mittie Gaskin, MD as Referring Physician (Ophthalmology)  I have personally reviewed and noted the following in the patient's chart:   Medical and social history Use of alcohol, tobacco or illicit drugs  Current medications and supplements including opioid prescriptions. Functional ability and status Nutritional status Physical activity Advanced directives List  of other physicians Hospitalizations, surgeries, and ER visits in previous 12 months Vitals Screenings to include cognitive, depression, and falls Referrals and appointments  No orders of the defined types were placed in this encounter.  In addition, I have reviewed and discussed with patient certain preventive protocols, quality metrics, and best practice recommendations. A written personalized care plan for preventive services as well as general preventive health recommendations were provided to patient.   Verdie CHRISTELLA Saba, CMA   04/11/2024   Return in 1 year (on 04/11/2025).  After Visit Summary: (Declined) Due to this being a telephonic visit, with patients personalized plan was offered to patient but patient Declined AVS at this time   Nurse Notes: Scheduled 2026 AWV/CPE appts

## 2024-04-11 NOTE — Patient Instructions (Signed)
 Ms. Matura,  Thank you for taking the time for your Medicare Wellness Visit. I appreciate your continued commitment to your health goals. Please review the care plan we discussed, and feel free to reach out if I can assist you further.  Please note that Annual Wellness Visits do not include a physical exam. Some assessments may be limited, especially if the visit was conducted virtually. If needed, we may recommend an in-person follow-up with your provider.  Ongoing Care Seeing your primary care provider every 3 to 6 months helps us  monitor your health and provide consistent, personalized care.   Referrals If a referral was made during today's visit and you haven't received any updates within two weeks, please contact the referred provider directly to check on the status.  Recommended Screenings:  Health Maintenance  Topic Date Due   Pap Smear  03/31/2025*   COVID-19 Vaccine (8 - 2025-26 season) 03/31/2025*   Colon Cancer Screening  04/11/2025*   Medicare Annual Wellness Visit  04/11/2025   Breast Cancer Screening  08/24/2025   DTaP/Tdap/Td vaccine (4 - Td or Tdap) 05/06/2033   Pneumococcal Vaccine for age over 55  Completed   Flu Shot  Completed   Osteoporosis screening with Bone Density Scan  Completed   Zoster (Shingles) Vaccine  Completed   Meningitis B Vaccine  Aged Out   Hepatitis B Vaccine  Discontinued  *Topic was postponed. The date shown is not the original due date.       04/11/2024   10:10 AM  Advanced Directives  Does Patient Have a Medical Advance Directive? Yes  Type of Estate Agent of [place];Living will  Does patient want to make changes to medical advance directive? Yes (Inpatient - patient requests chaplain consult to change a medical advance directive)  Copy of Healthcare Power of Attorney in Chart? No - copy requested    Vision: Annual vision screenings are recommended for early detection of glaucoma, cataracts, and diabetic  retinopathy. These exams can also reveal signs of chronic conditions such as diabetes and high blood pressure.  Dental: Annual dental screenings help detect early signs of oral cancer, gum disease, and other conditions linked to overall health, including heart disease and diabetes.

## 2024-04-12 ENCOUNTER — Other Ambulatory Visit: Payer: Self-pay

## 2024-04-12 MED ORDER — DIVALPROEX SODIUM 250 MG PO DR TAB: 250.0000 mg | DELAYED_RELEASE_TABLET | Freq: Every day | ORAL | 3 refills | Status: AC

## 2024-04-12 NOTE — Telephone Encounter (Signed)
 Copied from CRM #[id number]. Topic: Clinical - Medication Refill >> Apr 12, 2024 10:27 AM Dedra B wrote: Medication: divalproex  (DEPAKOTE ) 250 MG DR tablet  90 day supply  Has the patient contacted their pharmacy? Yes, no refills  This is the patient's preferred pharmacy:  [id number] [place], [place] - [address] AT [facility] OF [address] [place] [address] Phone: [phone] Fax: [phone]  Is this the correct pharmacy for this prescription? Yes  Has the prescription been filled recently? No  Is the patient out of the medication? No, few left  Has the patient been seen for an appointment in the last year OR does the patient have an upcoming appointment? Yes  Can we respond through MyChart? No  Agent: Please be advised that Rx refills may take up to 3 business days. We ask that you follow-up with your pharmacy.

## 2024-04-15 ENCOUNTER — Other Ambulatory Visit (INDEPENDENT_AMBULATORY_CARE_PROVIDER_SITE_OTHER)

## 2024-04-15 DIAGNOSIS — Z8673 Personal history of transient ischemic attack (TIA), and cerebral infarction without residual deficits: Secondary | ICD-10-CM | POA: Diagnosis not present

## 2024-04-15 DIAGNOSIS — E785 Hyperlipidemia, unspecified: Secondary | ICD-10-CM | POA: Diagnosis not present

## 2024-04-15 LAB — LIPID PANEL
Cholesterol: 183 mg/dL (ref 0–200)
HDL: 67.5 mg/dL (ref >39.00)
LDL Cholesterol: 90 mg/dL (ref 0–99)
NonHDL: 115.31
Total CHOL/HDL Ratio: 3
Triglycerides: 126 mg/dL (ref 0.0–149.0)
VLDL: 25.2 mg/dL (ref 0.0–40.0)

## 2024-04-21 ENCOUNTER — Ambulatory Visit (INDEPENDENT_AMBULATORY_CARE_PROVIDER_SITE_OTHER)

## 2024-04-21 VITALS — BP 138/88 | HR 74 | Temp 97.9°F | Ht 60.25 in | Wt 119.0 lb

## 2024-04-21 DIAGNOSIS — Z0000 Encounter for general adult medical examination without abnormal findings: Secondary | ICD-10-CM | POA: Diagnosis not present

## 2024-04-21 DIAGNOSIS — K219 Gastro-esophageal reflux disease without esophagitis: Secondary | ICD-10-CM | POA: Diagnosis not present

## 2024-04-21 DIAGNOSIS — Z23 Encounter for immunization: Secondary | ICD-10-CM

## 2024-04-21 DIAGNOSIS — I10 Essential (primary) hypertension: Secondary | ICD-10-CM | POA: Diagnosis not present

## 2024-04-21 DIAGNOSIS — E7849 Other hyperlipidemia: Secondary | ICD-10-CM | POA: Diagnosis not present

## 2024-04-21 MED ORDER — OMEPRAZOLE 20 MG PO CPDR: DELAYED_RELEASE_CAPSULE | ORAL | Status: DC

## 2024-04-21 NOTE — Progress Notes (Signed)
 Needs updated Prevnar 20. Saw eye doctor last month. Had hearing checked this year. Has hearing aids. Wearing them today. Loves Them

## 2024-04-21 NOTE — Patient Instructions (Addendum)
 Thank you for visiting [facility] today! Here's what we talked about: - START Atorvastatin , and Calcium -Vitamin D  combo supplement, you can stop the vitamin D  supplement  - Get COVID vaccine from pharmacy

## 2024-04-21 NOTE — Progress Notes (Signed)
 Assessment & Plan:   This visit was conducted in person. The patient gave informed consent to the use of [employer] AI technology to record the contents of the encounter as documented below.  Assessment & Plan Adult Wellness Visit Routine wellness visit with discussion on colonoscopy and mammogram decisions based on age and family history.  - Discontinued colonoscopy unless alarming symptoms develop per patient preference. - Requesting to continue mammogram for next year per patient preference. - Administered updated pneumonia vaccine today. - Recommended COVID booster at pharmacy.  I have personally reviewed and have noted: 1. The patient's medical and social history 2. Their use of alcohol, tobacco or illicit drugs 3. Their current medications and supplements 4. The patient's functional ability including ADL's, fall risks, home safety risks and hearing or visual impairment. 5. Diet and physical activities 6. Evidence for depression or mood disorder   Colonoscopy: Colonoscopy with [facility] in 2015 did show polyps, pathology revealed they were not precancerous, however given history of precancerous colon polyps, repeat in 5 years was recommended which was never done, declines at this time preferring quality of life rather than quantity. Mammogram: Done in April 2025 was WNL. Daughter had breast cancer, discussed about potential harms of continued screening in advanced age, pt would like to continue. Bone Density: 2022 showed osteoporosis - on prolia , Ca-Vit D supplement Pap: N/A Labs: Lipid and BMP UTD Immunizations: Prevnar today, covid from pharmacy Diet and Exercise: No concerns, daily exercises  Sleep and mood: No concerns  Dental and vision:UTD ADLs and IADLs: Capable Cognitive: Intact to orientation, naming, recall and repetition Fall risk and home safety: No concerns Health counselling given: Exercise to reduce fall risk  Advanced Directives Patient does have advanced  directives including living will. She does have a copy in the electronic medical record.  Has living will Husband, then one of her children, has health care POA Would accept resuscitation but no prolonged artificial ventilation, trial BIPAP Probably would not want tube feeds if cognitively unaware  Osteoporosis Low bone density confirmed by recent bone scan. Currently on Prolia . Calcium  levels normal. Discussed potential constipation from calcium  supplements and management options.  - Start calcium  and vitamin D  combination supplement, two tablets daily. - Monitor for constipation and adjust dose if necessary.  Essential hypertension Blood pressure 138/88 today, no changes to current regimen.  - Continue metoprolol  50 mg daily, amlodipine  7.5 mg daily  Hyperlipidemia Cholesterol levels acceptable, but due to history of stroke, atorvastatin  recommended to reduce stroke risk. Discussed importance of cholesterol management in stroke prevention.  - Start atorvastatin  20 mg daily.  Gastroesophageal reflux disease Increased frequency of symptoms, including nighttime burning sensation. Previous trial off omeprazole  resulted in frequent symptoms, indicating need for daily use.  - Resume daily omeprazole  20 mg. - Reevaluate symptoms in 6-8 weeks.   Follow-up: Return in about 1 year (around 04/21/2025) for CPE with fasting labs 1wk prior (Depakote  and lipid).        Subjective:   Patient ID: Tanya Knight, female    DOB: 07/17/1940  Age: 83 y.o. MRN: [id number]  Patient Care Team: Bennett Reuben POUR, MD as PCP - General (Family Medicine) Mittie Gaskin, MD as Referring Physician (Ophthalmology)   CC:  Chief Complaint  Patient presents with   Annual Exam    Stopped omeprazole . Having to take a lot of Tums. When lying down, has acid coming up into throat.     Tanya Knight is a 83 y.o. female here  for annual physical and f/u on chronic conditions, see assessment and plan for  further details   Discussed the use of AI scribe software for clinical note transcription with the patient, who gave verbal consent to proceed.  History of Present Illness Tanya Knight is an 83 year old female who presents for a routine follow-up visit.  She has a history of colon polyps, including precancerous ones, with the last colonoscopy in 2020 showing a non-precancerous polyp.  She had a mammogram in April 2025, which was normal. Despite being past the typical age for routine mammograms, she wishes to continue them due to a family history of breast cancer, including her daughter and two aunts. She is uncertain about pursuing treatment if cancer is detected but wants to be informed of any findings.  She has low bone density as indicated by a recent bone scan and is on Prolia . She has been taking vitamin D  but has not started the calcium  supplement due to concerns about constipation. Her blood calcium  levels are normal without supplements.  She has a history of acid reflux and has been using omeprazole  as needed. Recently, she has experienced frequent symptoms, including nighttime burning, and has been using Tums more often. She maintains a careful diet to manage her acid reflux.  She has a history of stroke and is not currently taking atorvastatin , although she has a prescription. She wanted to see her cholesterol levels without medication, which were normal. She is on metoprolol , Depakote  (250 mg in the morning and 500 mg in the evening), and amlodipine  (7.5 mg daily). She uses Tylenol  as needed.  She performs daily exercises prescribed by a physical therapist after her stroke. She uses a walker and performs stretches, floor exercises, and uses weights. She sleeps well and has no significant mood issues. She is independent in daily activities and has not experienced any falls recently. No significant mood issues, falls, or urination problems.    Depression Screening;    04/21/2024     2:53 PM 04/11/2024   10:20 AM 03/31/2024   11:05 AM 03/31/2023   10:49 AM 03/31/2023   10:26 AM 03/25/2022   11:43 AM 03/25/2021    3:47 PM  PHQ 2/9 Scores  PHQ - 2 Score 0 0 0 0 0 0 0  PHQ- 9 Score  3          Anxiety Screening:     No data to display           ROS: Negative unless specifically indicated above in HPI.       Objective:     BP 138/88 (BP Location: Left Arm, Patient Position: Sitting, Cuff Size: Normal)   Pulse 74   Temp 97.9 F (36.6 C) (Oral)   Ht 5' 0.25 (1.53 m)   Wt 119 lb (54 kg)   SpO2 96%   BMI 23.05 kg/m    Physical Exam GENERAL: Alert, cooperative, well developed, no acute distress. HEAD: Normocephalic atraumatic. EYES: Extraocular movements intact BL, pupils round, equal and reactive to light BL, conjunctivae normal BL. EARS: Tympanic membrane, ear canal and external ear normal BL, external auditory canals clear. NOSE: No congestion or rhinorrhea, mucous membranes are moist. THROAT: No oropharyngeal exudate or posterior oropharyngeal erythema, normal oral cavity, full dentition, pharynx normal. CARDIOVASCULAR: Normal heart rate and rhythm, S1 and S2 normal without murmurs. CHEST: Clear to auscultation bilaterally, no wheezes, rhonchi, or crackles. ABDOMEN: Soft, non tender, non distended, without organomegaly, normal bowel sounds. EXTREMITIES: No  cyanosis, mild pitting edema bilaterally. NEUROLOGICAL: Oriented to person, place and time, ambulates with walker, moves all extremities without gross motor or sensory deficit, cranial nerves grossly intact, memory intact. NECK: Thyroid  non-tender, no thyromegaly.        Mekhai Venuto K Markasia Carrol, MD  04/21/2024    Contains text generated by [employer].

## 2024-04-22 NOTE — Addendum Note (Signed)
 Addended by: KALLIE CLOTILDA SQUIBB on: 04/22/2024 11:32 AM   Modules accepted: Orders

## 2024-05-21 ENCOUNTER — Other Ambulatory Visit: Payer: Self-pay | Admitting: Family

## 2024-05-24 ENCOUNTER — Other Ambulatory Visit: Payer: Self-pay

## 2024-05-24 MED ORDER — AMLODIPINE BESYLATE 2.5 MG PO TABS: ORAL_TABLET | ORAL | 3 refills | Status: AC

## 2024-05-24 NOTE — Telephone Encounter (Signed)
 Tanya Knight

## 2024-05-30 ENCOUNTER — Other Ambulatory Visit: Payer: Self-pay

## 2024-05-30 DIAGNOSIS — K219 Gastro-esophageal reflux disease without esophagitis: Secondary | ICD-10-CM

## 2024-05-30 NOTE — Telephone Encounter (Signed)
 Last filled 03-10-24 #90 Last OV 04-21-24 Next OV 04-24-25 [facility]

## 2024-05-31 MED ORDER — OMEPRAZOLE 20 MG PO CPDR: DELAYED_RELEASE_CAPSULE | ORAL | 0 refills | Status: AC

## 2024-06-07 ENCOUNTER — Other Ambulatory Visit: Payer: Self-pay

## 2024-06-07 NOTE — Telephone Encounter (Signed)
 Last filled 02-26-24 #90 Last OV [id number] Next OV 04-24-25 [facility]

## 2024-06-08 MED ORDER — DIVALPROEX SODIUM 500 MG PO DR TAB: 500.0000 mg | DELAYED_RELEASE_TABLET | Freq: Every day | ORAL | 3 refills | Status: AC

## 2024-06-13 ENCOUNTER — Telehealth: Payer: Self-pay

## 2024-06-13 ENCOUNTER — Other Ambulatory Visit (HOSPITAL_COMMUNITY): Payer: Self-pay

## 2024-06-13 NOTE — Telephone Encounter (Signed)
 Prolia  VOB initiated via [email or web address]  Next Prolia  inj DUE: 07/07/24   PHARMACY COPAY: $35

## 2024-06-14 NOTE — Telephone Encounter (Signed)
 MEDICAL PA SUBMITTED VIA LATENT. KEY: [id number]

## 2024-06-14 NOTE — Telephone Encounter (Signed)
" ° °  [employer] cannot give estimate, following Medicare Advantage guideline (20% coinsurance, 20% admin fee)   "

## 2024-06-15 NOTE — Telephone Encounter (Signed)
 Pt ready for scheduling for PROLIA  on or after : 07/07/24  Option# 1: Buy/Bill (Office supplied medication)  Out-of-pocket cost due at time of clinic visit: $377  Number of injection/visits approved: 2  Primary: [employer] Prolia  co-insurance: 20% Admin fee co-insurance: 20%  Secondary: --- Prolia  co-insurance:  Admin fee co-insurance:   Medical Benefit Details: Date Benefits were checked: 06/13/24 Deductible: NO/ Coinsurance: 20%/ Admin Fee: 20%  Prior Auth: APPROVED PA# [id number] Expiration Date: 04/12/21-05/11/25  # of doses approved: 2 ----------------------------------------------------------------------- Option# 2- Med Obtained from pharmacy:  Pharmacy benefit: Copay $64 (Paid to pharmacy) Admin Fee: 20% (Pay at clinic)  Prior Auth: N/A PA# Expiration Date:   # of doses approved:   If patient wants fill through the pharmacy benefit please send prescription to: [facility], and include estimated need by date in rx notes. Pharmacy will ship medication directly to the office.  Patient NOT eligible for Prolia  Copay Card. Copay Card can make patient's cost as little as $25. Link to apply: [email or web address]  ** This summary of benefits is an estimation of the patient's out-of-pocket cost. Exact cost may very based on individual plan coverage.

## 2024-06-16 NOTE — Telephone Encounter (Signed)
 Referral completed

## 2024-06-16 NOTE — Telephone Encounter (Signed)
 Can this information please be properly documented on a referral that is open? This information is currently documented on a referral that is closed and can't be sent to our work que.

## 2025-04-12 ENCOUNTER — Ambulatory Visit

## 2025-04-12 ENCOUNTER — Encounter

## 2025-04-14 ENCOUNTER — Other Ambulatory Visit

## 2025-04-24 ENCOUNTER — Encounter
